# Patient Record
Sex: Female | Born: 1940 | Race: Black or African American | Hispanic: No | Smoking: Former smoker
Health system: Southern US, Community
[De-identification: ages and names within clinical notes are randomized; demographics above are authoritative.]

## PROBLEM LIST (undated history)

## (undated) DIAGNOSIS — I714 Abdominal aortic aneurysm, without rupture, unspecified: Secondary | ICD-10-CM

## (undated) DIAGNOSIS — J189 Pneumonia, unspecified organism: Secondary | ICD-10-CM

## (undated) DIAGNOSIS — R32 Unspecified urinary incontinence: Secondary | ICD-10-CM

## (undated) DIAGNOSIS — I739 Peripheral vascular disease, unspecified: Secondary | ICD-10-CM

## (undated) DIAGNOSIS — G459 Transient cerebral ischemic attack, unspecified: Secondary | ICD-10-CM

## (undated) DIAGNOSIS — I219 Acute myocardial infarction, unspecified: Secondary | ICD-10-CM

## (undated) DIAGNOSIS — N200 Calculus of kidney: Secondary | ICD-10-CM

## (undated) DIAGNOSIS — E785 Hyperlipidemia, unspecified: Secondary | ICD-10-CM

## (undated) DIAGNOSIS — I639 Cerebral infarction, unspecified: Secondary | ICD-10-CM

## (undated) DIAGNOSIS — H409 Unspecified glaucoma: Secondary | ICD-10-CM

## (undated) DIAGNOSIS — I729 Aneurysm of unspecified site: Secondary | ICD-10-CM

## (undated) DIAGNOSIS — K219 Gastro-esophageal reflux disease without esophagitis: Secondary | ICD-10-CM

## (undated) DIAGNOSIS — I10 Essential (primary) hypertension: Secondary | ICD-10-CM

## (undated) DIAGNOSIS — K5900 Constipation, unspecified: Secondary | ICD-10-CM

## (undated) DIAGNOSIS — M1990 Unspecified osteoarthritis, unspecified site: Secondary | ICD-10-CM

## (undated) HISTORY — DX: Transient cerebral ischemic attack, unspecified: G45.9

## (undated) HISTORY — DX: Essential (primary) hypertension: I10

## (undated) HISTORY — PX: EYE SURGERY: SHX253

## (undated) HISTORY — PX: TOTAL KNEE ARTHROPLASTY: SHX125

## (undated) HISTORY — DX: Hyperlipidemia, unspecified: E78.5

## (undated) HISTORY — PX: TUBAL LIGATION: SHX77

## (undated) HISTORY — DX: Acute myocardial infarction, unspecified: I21.9

## (undated) HISTORY — DX: Peripheral vascular disease, unspecified: I73.9

## (undated) HISTORY — PX: BACK SURGERY: SHX140

## (undated) HISTORY — PX: HERNIA REPAIR: SHX51

## (undated) HISTORY — PX: ABDOMINAL AORTIC ANEURYSM REPAIR: SHX42

## (undated) HISTORY — DX: Aneurysm of unspecified site: I72.9

## (undated) HISTORY — PX: VENTRAL HERNIA REPAIR: SHX424

## (undated) HISTORY — PX: ABDOMINAL AORTIC ANEURYSM REPAIR: SUR1152

## (undated) HISTORY — PX: ABDOMINAL HYSTERECTOMY: SHX81

## (undated) HISTORY — PX: JOINT REPLACEMENT: SHX530

## (undated) HISTORY — PX: OTHER SURGICAL HISTORY: SHX169

## (undated) HISTORY — DX: Unspecified osteoarthritis, unspecified site: M19.90

## (undated) HISTORY — PX: CARDIAC CATHETERIZATION: SHX172

---

## 1997-07-09 ENCOUNTER — Ambulatory Visit (HOSPITAL_COMMUNITY): Admission: RE | Admit: 1997-07-09 | Discharge: 1997-07-09 | Payer: Self-pay | Admitting: *Deleted

## 1998-08-29 ENCOUNTER — Encounter: Payer: Self-pay | Admitting: Emergency Medicine

## 1998-08-29 ENCOUNTER — Emergency Department (HOSPITAL_COMMUNITY): Admission: EM | Admit: 1998-08-29 | Discharge: 1998-08-29 | Payer: Self-pay | Admitting: Emergency Medicine

## 1998-09-24 ENCOUNTER — Emergency Department (HOSPITAL_COMMUNITY): Admission: EM | Admit: 1998-09-24 | Discharge: 1998-09-24 | Payer: Self-pay | Admitting: Emergency Medicine

## 1999-03-26 ENCOUNTER — Emergency Department (HOSPITAL_COMMUNITY): Admission: EM | Admit: 1999-03-26 | Discharge: 1999-03-26 | Payer: Self-pay | Admitting: Emergency Medicine

## 1999-06-07 ENCOUNTER — Emergency Department (HOSPITAL_COMMUNITY): Admission: EM | Admit: 1999-06-07 | Discharge: 1999-06-08 | Payer: Self-pay

## 1999-12-17 ENCOUNTER — Emergency Department (HOSPITAL_COMMUNITY): Admission: EM | Admit: 1999-12-17 | Discharge: 1999-12-17 | Payer: Self-pay | Admitting: *Deleted

## 1999-12-17 ENCOUNTER — Encounter: Payer: Self-pay | Admitting: Emergency Medicine

## 1999-12-31 ENCOUNTER — Encounter: Payer: Self-pay | Admitting: Emergency Medicine

## 1999-12-31 ENCOUNTER — Emergency Department (HOSPITAL_COMMUNITY): Admission: EM | Admit: 1999-12-31 | Discharge: 1999-12-31 | Payer: Self-pay | Admitting: Emergency Medicine

## 2000-03-02 ENCOUNTER — Ambulatory Visit (HOSPITAL_COMMUNITY): Admission: RE | Admit: 2000-03-02 | Discharge: 2000-03-02 | Payer: Self-pay | Admitting: Cardiology

## 2000-03-02 ENCOUNTER — Encounter: Payer: Self-pay | Admitting: Cardiology

## 2000-04-24 ENCOUNTER — Other Ambulatory Visit: Admission: RE | Admit: 2000-04-24 | Discharge: 2000-04-24 | Payer: Self-pay | Admitting: *Deleted

## 2000-04-26 ENCOUNTER — Encounter: Admission: RE | Admit: 2000-04-26 | Discharge: 2000-04-26 | Payer: Self-pay | Admitting: Cardiology

## 2000-04-26 ENCOUNTER — Encounter: Payer: Self-pay | Admitting: Cardiology

## 2000-06-30 ENCOUNTER — Encounter: Payer: Self-pay | Admitting: Cardiology

## 2000-06-30 ENCOUNTER — Encounter: Payer: Self-pay | Admitting: Emergency Medicine

## 2000-07-01 ENCOUNTER — Inpatient Hospital Stay (HOSPITAL_COMMUNITY): Admission: EM | Admit: 2000-07-01 | Discharge: 2000-07-02 | Payer: Self-pay | Admitting: Emergency Medicine

## 2000-07-01 ENCOUNTER — Encounter: Payer: Self-pay | Admitting: Cardiology

## 2000-07-01 ENCOUNTER — Encounter: Payer: Self-pay | Admitting: Neurology

## 2000-07-03 ENCOUNTER — Encounter: Admission: RE | Admit: 2000-07-03 | Discharge: 2000-10-01 | Payer: Self-pay | Admitting: Cardiology

## 2000-07-25 ENCOUNTER — Ambulatory Visit (HOSPITAL_COMMUNITY): Admission: RE | Admit: 2000-07-25 | Discharge: 2000-07-25 | Payer: Self-pay | Admitting: Cardiology

## 2000-07-25 ENCOUNTER — Encounter: Payer: Self-pay | Admitting: Cardiology

## 2000-08-05 ENCOUNTER — Encounter: Payer: Self-pay | Admitting: Emergency Medicine

## 2000-08-05 ENCOUNTER — Observation Stay (HOSPITAL_COMMUNITY): Admission: EM | Admit: 2000-08-05 | Discharge: 2000-08-06 | Payer: Self-pay | Admitting: *Deleted

## 2001-01-29 ENCOUNTER — Ambulatory Visit (HOSPITAL_COMMUNITY): Admission: RE | Admit: 2001-01-29 | Discharge: 2001-01-29 | Payer: Self-pay | Admitting: Gastroenterology

## 2001-02-22 ENCOUNTER — Encounter: Payer: Self-pay | Admitting: Emergency Medicine

## 2001-02-22 ENCOUNTER — Emergency Department (HOSPITAL_COMMUNITY): Admission: EM | Admit: 2001-02-22 | Discharge: 2001-02-22 | Payer: Self-pay | Admitting: Emergency Medicine

## 2001-05-26 ENCOUNTER — Inpatient Hospital Stay (HOSPITAL_COMMUNITY): Admission: EM | Admit: 2001-05-26 | Discharge: 2001-05-27 | Payer: Self-pay | Admitting: Emergency Medicine

## 2001-05-26 ENCOUNTER — Encounter: Payer: Self-pay | Admitting: Cardiology

## 2001-05-27 ENCOUNTER — Encounter: Payer: Self-pay | Admitting: Cardiovascular Disease

## 2001-08-27 ENCOUNTER — Encounter: Payer: Self-pay | Admitting: Cardiology

## 2001-08-27 ENCOUNTER — Encounter: Admission: RE | Admit: 2001-08-27 | Discharge: 2001-08-27 | Payer: Self-pay | Admitting: Cardiology

## 2003-02-20 ENCOUNTER — Encounter: Admission: RE | Admit: 2003-02-20 | Discharge: 2003-02-20 | Payer: Self-pay | Admitting: Cardiology

## 2003-02-20 ENCOUNTER — Encounter: Payer: Self-pay | Admitting: Cardiology

## 2003-03-22 ENCOUNTER — Emergency Department (HOSPITAL_COMMUNITY): Admission: EM | Admit: 2003-03-22 | Discharge: 2003-03-22 | Payer: Self-pay | Admitting: Emergency Medicine

## 2003-03-22 ENCOUNTER — Encounter: Payer: Self-pay | Admitting: *Deleted

## 2003-05-19 ENCOUNTER — Encounter: Admission: RE | Admit: 2003-05-19 | Discharge: 2003-05-19 | Payer: Self-pay | Admitting: Cardiology

## 2003-06-17 ENCOUNTER — Encounter: Admission: RE | Admit: 2003-06-17 | Discharge: 2003-06-17 | Payer: Self-pay | Admitting: Orthopedic Surgery

## 2003-06-18 ENCOUNTER — Ambulatory Visit (HOSPITAL_COMMUNITY): Admission: RE | Admit: 2003-06-18 | Discharge: 2003-06-18 | Payer: Self-pay | Admitting: Orthopedic Surgery

## 2003-12-31 ENCOUNTER — Encounter: Admission: RE | Admit: 2003-12-31 | Discharge: 2003-12-31 | Payer: Self-pay | Admitting: Gastroenterology

## 2004-01-04 ENCOUNTER — Encounter: Admission: RE | Admit: 2004-01-04 | Discharge: 2004-01-04 | Payer: Self-pay | Admitting: Cardiology

## 2004-02-05 ENCOUNTER — Ambulatory Visit (HOSPITAL_COMMUNITY): Admission: RE | Admit: 2004-02-05 | Discharge: 2004-02-05 | Payer: Self-pay | Admitting: Gastroenterology

## 2004-02-05 ENCOUNTER — Encounter (INDEPENDENT_AMBULATORY_CARE_PROVIDER_SITE_OTHER): Payer: Self-pay | Admitting: *Deleted

## 2004-09-02 ENCOUNTER — Encounter: Admission: RE | Admit: 2004-09-02 | Discharge: 2004-09-02 | Payer: Self-pay | Admitting: Cardiology

## 2004-10-04 ENCOUNTER — Encounter: Admission: RE | Admit: 2004-10-04 | Discharge: 2004-10-04 | Payer: Self-pay | Admitting: Cardiology

## 2005-07-21 ENCOUNTER — Encounter: Admission: RE | Admit: 2005-07-21 | Discharge: 2005-07-21 | Payer: Self-pay | Admitting: Cardiology

## 2005-07-25 ENCOUNTER — Emergency Department (HOSPITAL_COMMUNITY): Admission: EM | Admit: 2005-07-25 | Discharge: 2005-07-25 | Payer: Self-pay | Admitting: Emergency Medicine

## 2005-07-25 ENCOUNTER — Ambulatory Visit (HOSPITAL_COMMUNITY): Admission: RE | Admit: 2005-07-25 | Discharge: 2005-07-25 | Payer: Self-pay | Admitting: Cardiology

## 2005-08-11 ENCOUNTER — Ambulatory Visit (HOSPITAL_COMMUNITY): Admission: RE | Admit: 2005-08-11 | Discharge: 2005-08-11 | Payer: Self-pay | Admitting: *Deleted

## 2005-09-05 ENCOUNTER — Inpatient Hospital Stay (HOSPITAL_COMMUNITY): Admission: RE | Admit: 2005-09-05 | Discharge: 2005-09-09 | Payer: Self-pay | Admitting: *Deleted

## 2006-04-03 ENCOUNTER — Encounter: Admission: RE | Admit: 2006-04-03 | Discharge: 2006-04-03 | Payer: Self-pay | Admitting: Cardiology

## 2006-09-20 ENCOUNTER — Ambulatory Visit (HOSPITAL_COMMUNITY): Admission: RE | Admit: 2006-09-20 | Discharge: 2006-09-20 | Payer: Self-pay | Admitting: Obstetrics

## 2006-12-22 ENCOUNTER — Emergency Department (HOSPITAL_COMMUNITY): Admission: EM | Admit: 2006-12-22 | Discharge: 2006-12-22 | Payer: Self-pay | Admitting: Emergency Medicine

## 2007-01-05 ENCOUNTER — Encounter: Admission: RE | Admit: 2007-01-05 | Discharge: 2007-01-05 | Payer: Self-pay | Admitting: Cardiology

## 2008-06-05 ENCOUNTER — Encounter: Admission: RE | Admit: 2008-06-05 | Discharge: 2008-06-05 | Payer: Self-pay | Admitting: Surgery

## 2009-02-04 ENCOUNTER — Inpatient Hospital Stay (HOSPITAL_COMMUNITY): Admission: EM | Admit: 2009-02-04 | Discharge: 2009-02-05 | Payer: Self-pay | Admitting: Pediatrics

## 2009-02-04 ENCOUNTER — Emergency Department (HOSPITAL_COMMUNITY): Admission: EM | Admit: 2009-02-04 | Discharge: 2009-02-05 | Payer: Self-pay | Admitting: Family Medicine

## 2009-02-11 ENCOUNTER — Inpatient Hospital Stay (HOSPITAL_BASED_OUTPATIENT_CLINIC_OR_DEPARTMENT_OTHER): Admission: RE | Admit: 2009-02-11 | Discharge: 2009-02-11 | Payer: Self-pay | Admitting: Cardiology

## 2009-03-11 ENCOUNTER — Emergency Department (HOSPITAL_COMMUNITY): Admission: EM | Admit: 2009-03-11 | Discharge: 2009-03-12 | Payer: Self-pay | Admitting: Emergency Medicine

## 2009-12-07 ENCOUNTER — Ambulatory Visit (HOSPITAL_COMMUNITY): Admission: RE | Admit: 2009-12-07 | Discharge: 2009-12-07 | Payer: Self-pay | Admitting: Cardiology

## 2009-12-07 ENCOUNTER — Ambulatory Visit: Payer: Self-pay | Admitting: Vascular Surgery

## 2009-12-07 ENCOUNTER — Encounter (INDEPENDENT_AMBULATORY_CARE_PROVIDER_SITE_OTHER): Payer: Self-pay | Admitting: Cardiology

## 2009-12-29 ENCOUNTER — Ambulatory Visit: Payer: Self-pay | Admitting: Vascular Surgery

## 2010-01-18 ENCOUNTER — Emergency Department (HOSPITAL_COMMUNITY): Admission: EM | Admit: 2010-01-18 | Discharge: 2010-01-18 | Payer: Self-pay | Admitting: Family Medicine

## 2010-03-25 ENCOUNTER — Emergency Department (HOSPITAL_COMMUNITY): Admission: EM | Admit: 2010-03-25 | Discharge: 2010-03-25 | Payer: Self-pay | Admitting: Emergency Medicine

## 2010-04-07 ENCOUNTER — Encounter: Admission: RE | Admit: 2010-04-07 | Discharge: 2010-04-07 | Payer: Self-pay | Admitting: Family Medicine

## 2010-04-07 ENCOUNTER — Ambulatory Visit: Payer: Self-pay | Admitting: Vascular Surgery

## 2010-06-18 ENCOUNTER — Encounter: Payer: Self-pay | Admitting: Orthopedic Surgery

## 2010-07-12 ENCOUNTER — Encounter: Payer: Self-pay | Admitting: Internal Medicine

## 2010-07-13 ENCOUNTER — Encounter: Payer: Self-pay | Admitting: Internal Medicine

## 2010-07-15 ENCOUNTER — Inpatient Hospital Stay (INDEPENDENT_AMBULATORY_CARE_PROVIDER_SITE_OTHER)
Admission: RE | Admit: 2010-07-15 | Discharge: 2010-07-15 | Disposition: A | Discharge status: Home / Self Care | Payer: Medicare Other | Source: Ambulatory Visit | Attending: Emergency Medicine | Admitting: Emergency Medicine

## 2010-07-15 DIAGNOSIS — F17200 Nicotine dependence, unspecified, uncomplicated: Secondary | ICD-10-CM

## 2010-07-15 DIAGNOSIS — L97909 Non-pressure chronic ulcer of unspecified part of unspecified lower leg with unspecified severity: Secondary | ICD-10-CM

## 2010-08-10 LAB — DIFFERENTIAL
Basophils Absolute: 0 10*3/uL (ref 0.0–0.1)
Basophils Relative: 1 % (ref 0–1)
Eosinophils Absolute: 0.2 10*3/uL (ref 0.0–0.7)
Eosinophils Relative: 3 % (ref 0–5)
Lymphocytes Relative: 42 % (ref 12–46)
Lymphs Abs: 3.5 10*3/uL (ref 0.7–4.0)
Monocytes Absolute: 0.6 10*3/uL (ref 0.1–1.0)
Monocytes Relative: 7 % (ref 3–12)
Neutro Abs: 4.1 10*3/uL (ref 1.7–7.7)
Neutrophils Relative %: 48 % (ref 43–77)

## 2010-08-10 LAB — BASIC METABOLIC PANEL
Chloride: 106 mEq/L (ref 96–112)
GFR calc Af Amer: >60 mL/min (ref >60)
Potassium: 4.5 mEq/L (ref 3.5–5.1)

## 2010-08-10 LAB — BRAIN NATRIURETIC PEPTIDE: Pro B Natriuretic peptide (BNP): <30 pg/mL (ref 0.0–100.0)

## 2010-08-10 LAB — CBC
Hemoglobin: 12.2 g/dL (ref 12.0–15.0)
MCH: 29.3 pg (ref 26.0–34.0)
MCV: 88.7 fL (ref 78.0–100.0)
Platelets: 218 10*3/uL (ref 150–400)
WBC: 8.4 10*3/uL (ref 4.0–10.5)

## 2010-08-10 LAB — POCT CARDIAC MARKERS
CKMB, poc: <1 ng/mL — ABNORMAL LOW (ref 1.0–8.0)
CKMB, poc: <1 ng/mL — ABNORMAL LOW (ref 1.0–8.0)
Myoglobin, poc: 101 ng/mL (ref 12–200)
Myoglobin, poc: 125 ng/mL (ref 12–200)
Troponin i, poc: <0.05 ng/mL (ref 0.00–0.09)

## 2010-08-11 LAB — CULTURE, ROUTINE-ABSCESS

## 2010-08-22 ENCOUNTER — Encounter: Payer: Medicare Other | Admitting: Internal Medicine

## 2010-08-24 ENCOUNTER — Encounter: Payer: Medicare Other | Admitting: Internal Medicine

## 2010-09-01 LAB — URINALYSIS, ROUTINE W REFLEX MICROSCOPIC
Bilirubin Urine: NEGATIVE
Glucose, UA: 250 mg/dL — AB
Hgb urine dipstick: NEGATIVE
Ketones, ur: NEGATIVE mg/dL
Ketones, ur: NEGATIVE mg/dL
Nitrite: NEGATIVE
Protein, ur: NEGATIVE mg/dL
pH: 5.5 (ref 5.0–8.0)

## 2010-09-01 LAB — BASIC METABOLIC PANEL
CO2: 27 mEq/L (ref 19–32)
Chloride: 106 mEq/L (ref 96–112)
GFR calc Af Amer: >60 mL/min (ref >60)
Potassium: 4 mEq/L (ref 3.5–5.1)

## 2010-09-01 LAB — DIFFERENTIAL
Eosinophils Absolute: 0.2 10*3/uL (ref 0.0–0.7)
Eosinophils Relative: 3 % (ref 0–5)
Lymphs Abs: 3.4 10*3/uL (ref 0.7–4.0)
Monocytes Absolute: 0.4 10*3/uL (ref 0.1–1.0)
Monocytes Relative: 4 % (ref 3–12)

## 2010-09-01 LAB — URINE MICROSCOPIC-ADD ON

## 2010-09-01 LAB — CBC
HCT: 41 % (ref 36.0–46.0)
Hemoglobin: 13.8 g/dL (ref 12.0–15.0)
MCV: 93.6 fL (ref 78.0–100.0)
RBC: 4.38 MIL/uL (ref 3.87–5.11)
WBC: 8.2 10*3/uL (ref 4.0–10.5)

## 2010-09-02 LAB — CARDIAC PANEL(CRET KIN+CKTOT+MB+TROPI)
CK, MB: 0.9 ng/mL (ref 0.3–4.0)
CK, MB: 1.2 ng/mL (ref 0.3–4.0)
Relative Index: INVALID (ref 0.0–2.5)
Relative Index: INVALID (ref 0.0–2.5)
Total CK: 66 U/L (ref 7–177)
Troponin I: 0.01 ng/mL (ref 0.00–0.06)
Troponin I: 0.02 ng/mL (ref 0.00–0.06)

## 2010-09-02 LAB — CBC
HCT: 33.5 % — ABNORMAL LOW (ref 36.0–46.0)
MCHC: 33.6 g/dL (ref 30.0–36.0)
MCV: 93.1 fL (ref 78.0–100.0)
Platelets: 163 10*3/uL (ref 150–400)
Platelets: 195 10*3/uL (ref 150–400)
WBC: 6.7 10*3/uL (ref 4.0–10.5)
WBC: 7 10*3/uL (ref 4.0–10.5)

## 2010-09-02 LAB — COMPREHENSIVE METABOLIC PANEL
AST: 14 U/L (ref 0–37)
Albumin: 3.4 g/dL — ABNORMAL LOW (ref 3.5–5.2)
BUN: 10 mg/dL (ref 6–23)
CO2: 28 mEq/L (ref 19–32)
Calcium: 9 mg/dL (ref 8.4–10.5)
Creatinine, Ser: 0.96 mg/dL (ref 0.4–1.2)
GFR calc Af Amer: >60 mL/min (ref >60)
GFR calc non Af Amer: 58 mL/min — ABNORMAL LOW (ref >60)

## 2010-09-02 LAB — LIPID PANEL
Cholesterol: 169 mg/dL (ref 0–200)
HDL: 30 mg/dL — ABNORMAL LOW (ref >39)
LDL Cholesterol: 116 mg/dL — ABNORMAL HIGH (ref 0–99)
Triglycerides: 116 mg/dL (ref <150)

## 2010-09-02 LAB — POCT CARDIAC MARKERS: Troponin i, poc: <0.05 ng/mL (ref 0.00–0.09)

## 2010-09-02 LAB — PROTIME-INR
INR: 0.9 (ref 0.00–1.49)
Prothrombin Time: 12.5 seconds (ref 11.6–15.2)

## 2010-09-02 LAB — HEMOGLOBIN A1C: Mean Plasma Glucose: 240 mg/dL

## 2010-09-02 LAB — CK TOTAL AND CKMB (NOT AT ARMC)
CK, MB: 1.1 ng/mL (ref 0.3–4.0)
Relative Index: INVALID (ref 0.0–2.5)

## 2010-09-02 LAB — HEPARIN LEVEL (UNFRACTIONATED): Heparin Unfractionated: 0.75 [IU]/mL — ABNORMAL HIGH (ref 0.30–0.70)

## 2010-09-02 LAB — DIFFERENTIAL
Basophils Absolute: 0 10*3/uL (ref 0.0–0.1)
Basophils Relative: 0 % (ref 0–1)
Eosinophils Absolute: 0.1 10*3/uL (ref 0.0–0.7)
Lymphs Abs: 2.8 10*3/uL (ref 0.7–4.0)
Neutrophils Relative %: 51 % (ref 43–77)

## 2010-09-02 LAB — POCT I-STAT, CHEM 8
Calcium, Ion: 1.16 mmol/L (ref 1.12–1.32)
HCT: 39 % (ref 36.0–46.0)
TCO2: 27 mmol/L (ref 0–100)

## 2010-09-02 LAB — GLUCOSE, CAPILLARY
Glucose-Capillary: 204 mg/dL — ABNORMAL HIGH (ref 70–99)
Glucose-Capillary: 267 mg/dL — ABNORMAL HIGH (ref 70–99)

## 2010-09-02 LAB — BRAIN NATRIURETIC PEPTIDE: Pro B Natriuretic peptide (BNP): 58 pg/mL (ref 0.0–100.0)

## 2010-09-02 LAB — MAGNESIUM: Magnesium: 2 mg/dL (ref 1.5–2.5)

## 2010-09-12 ENCOUNTER — Encounter: Payer: Medicare Other | Admitting: Internal Medicine

## 2010-09-28 ENCOUNTER — Ambulatory Visit: Payer: Medicare Other

## 2010-10-03 ENCOUNTER — Encounter: Payer: Medicare Other | Admitting: Internal Medicine

## 2010-10-06 ENCOUNTER — Encounter (INDEPENDENT_AMBULATORY_CARE_PROVIDER_SITE_OTHER): Payer: Medicare Other

## 2010-10-06 ENCOUNTER — Ambulatory Visit (INDEPENDENT_AMBULATORY_CARE_PROVIDER_SITE_OTHER): Payer: Medicare Other | Admitting: Vascular Surgery

## 2010-10-06 DIAGNOSIS — I739 Peripheral vascular disease, unspecified: Secondary | ICD-10-CM

## 2010-10-06 DIAGNOSIS — I70219 Atherosclerosis of native arteries of extremities with intermittent claudication, unspecified extremity: Secondary | ICD-10-CM

## 2010-10-07 NOTE — Assessment & Plan Note (Signed)
OFFICE VISIT  [name], [name] DOB:  01/23/1941                                       10/06/2010 [id number]  The patient returns for followup today.  She was last seen in November of 2011.  We have been following her for mild claudication and peripheral arterial disease.  She continues to deny any rest pain.  She states that her walking distance has actually improved somewhat with increased exercise.  She is able to walk all over the grocery store with a small amount of support but still does get some claudication type symptoms after walking about a block.  However, she states that this is 2-3 times further than when we saw her in November.  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  Other chronic medical problems continue to remain diabetes, elevated cholesterol and hypertension.  These are all currently controlled and followed by Dr. Montez Morita.  She does unfortunately continues to smoke a half pack of cigarettes today but she again is considering trying to quit.  REVIEW OF SYSTEMS:  She is denying any shortness of breath.  She is denying any chest pain.  PHYSICAL EXAM:  Vital signs:  Blood pressure 167/80 in the left arm, heart rate 60 and regular, oxygen saturation is 97% on room air, respirations 14.  Chest:  Clear to auscultation.  Cardiac:  Regular rate and rhythm.  Lower extremities:  She has 2+ femoral pulses bilaterally. She has absent popliteal and pedal pulses.  Feet are pink, warm and well- perfused.  No ulcerations.  She had bilateral ABIs performed today which I reviewed and interpreted. ABI on the right side was 0.56 which is unchanged.  ABI on the left side was 0.7 which is slightly improved from November of 2011.  Overall, the patient is doing well.  I believe the best option at this point would be for her to follow up again  in 6 months' time with repeat ABIs or sooner if her symptoms become worse over time.  She again currently is in favor of conservative management with a walking program and trying to stop smoking.    Jessy Oto. Yosmar Ryker, MD Electronically Signed  CEF/MEDQ  D:  10/06/2010  T:  10/07/2010  Job:  [id number]  cc:   Ardyth Gal. Spruill, M.D.

## 2010-10-11 ENCOUNTER — Ambulatory Visit: Payer: Medicare Other | Attending: Family Medicine

## 2010-10-11 DIAGNOSIS — M545 Low back pain, unspecified: Secondary | ICD-10-CM | POA: Insufficient documentation

## 2010-10-11 DIAGNOSIS — IMO0001 Reserved for inherently not codable concepts without codable children: Secondary | ICD-10-CM | POA: Insufficient documentation

## 2010-10-11 DIAGNOSIS — R5381 Other malaise: Secondary | ICD-10-CM | POA: Insufficient documentation

## 2010-10-11 DIAGNOSIS — M25569 Pain in unspecified knee: Secondary | ICD-10-CM | POA: Insufficient documentation

## 2010-10-11 NOTE — Assessment & Plan Note (Signed)
OFFICE VISIT   Laura, RUSCH [name]  DOB:  12/28/1940                                       04/07/2010  [id number]   [name] returns for follow-up today.  She was last seen in August  2011.  She is being followed for peripheral arterial disease in both  lower extremities.  She continues to complain of lower extremity  claudication type symptoms after walking 20-30 yards.  She states that  these have not really changed since her last office visit.  She  occasionally does have some pain in her feet at night time but this does  not really sound like classic rest pain.   Chronic medical problems include diabetes, elevated cholesterol, and  hypertension.  These are all followed by Dr. Montez Morita.  She unfortunately  continues to smoke one half-pack of cigarettes per day.  She was  counseled regarding smoking cessation for greater than 3 minutes today.  Previous ABIs were in the 0.5 range at her previous visit.   SOCIAL HISTORY:  As mentioned above.   REVIEW OF SYSTEMS:  Unchanged from her previous office visit 3 months  ago.   PHYSICAL EXAMINATION:  Blood pressure is 138/81 in the left arm, heart  rate is 77 regular, oxygen saturation is 100% on room air.  Lower  extremity exam:  She has 2+ femoral pulses bilaterally.  She has absent  popliteal and pedal pulses.  Feet are cool bilaterally but reasonably  well-perfused.  She has no ulcerations on feet.   She had bilateral ABIs performed today which were 0.58 on the right and  0.58 on the left.  She had a toe pressure of 54 on the left and of 26 on  the right.  I have discussed with [name] again today whether or not  she was interested in an intervention for lower extremities.  She wishes  conservative management for now.  She will continue walking, taking her  antiplatelet therapy and try to quit smoking.  She will follow up in 3  months' time for repeat ABIs.     Jessy Oto. Fields, MD  Electronically Signed   CEF/MEDQ  D:  04/07/2010  T:  04/08/2010  Job:  [id number]   cc:   Ardyth Gal. Spruill, [name].D.

## 2010-10-11 NOTE — Assessment & Plan Note (Signed)
OFFICE VISIT   SCHENDEL, Felix M  DOB:  [birth year]                                       12/29/2009  [id number]   CHIEF COMPLAINT:  Bilateral leg pain.   HISTORY OF PRESENT ILLNESS:  The patient is a 70 year old female who  complains of bilateral lower extremity claudication.  She is referred by  Dr. Ardyth Gal. Spruill. She states that both of her legs have been  hurting from the calf down for about a year and this has become  progressively worse over time.  She states the pain starts in the calf  and radiates down into the foot.  The pain begins after walking  approximately one block.   ATHEROSCLEROTIC RISK FACTORS:  Her chronic medical problems include  diabetes, elevated cholesterol, hypertension.  These are all currently  controlled and followed by Dr. Montez Morita.  She has had diabetes for  greater than 10 years.  She also smokes one half-pack of cigarettes per  day.  Greater than 3 minutes today were spent regarding smoking  cessation counseling.  She states that she has tried to quit several  times.  She has been down from one pack a day for half pack a day over  the last 6 months.  She has tried nicotine patches in the past but these  made her heart flutter.  I did discuss with her several other options  and techniques today.   She denies any rest pain in her feet.  She denies any ulcerations on her  feet.  She denies any trouble healing wounds on her feet.   PAST MEDICAL HISTORY:  As listed above.   PAST SURGICAL HISTORY:  Left knee replacement, uterine tumor removed,  tubal ligation, ventral hernia repair, abdominal aortic aneurysm  repaired by Dr. Drucie Opitz in April 2007.  This was a tube graft.  This  was done with a 14 mm graft.   SOCIAL HISTORY:  She is retired.  She is single.  Smoking history as  listed above.  She does not consume alcohol regularly.  She denies any  drug use.  She has 5 children and states that several of them  have  problems that cause her significant stress.   FAMILY HISTORY:  Unremarkable.   REVIEW OF SYSTEMS:  On 12 different points was performed.  Please see  intake referral form for details regarding this.   MEDICATIONS:  Glimepiride 2 mg twice a day, Nexium 40 mg once a day,  lisinopril 20 mg once a day, metformin 1000 mg twice a day, Toprol 50 mg  once a day, ibuprofen 800 mg p.r.n., Crestor 20 mg q.h.s., B12 at 50 mg  once a day, vitamin D3, aspirin 81 mg once a day.   ALLERGIES:  She has an allergy listed to codeine.   PHYSICAL EXAMINATION:  Blood pressure 156/77 in the left arm, heart rate  78 and regular, respirations 24.  HEENT:  Unremarkable.  Neck:  Has 2+  carotid pulses without bruit.  Chest:  Clear to auscultation.  Cardiac:  Regular rate rhythm without murmur.  Abdomen:  Soft, nontender,  nondistended.  A well-healed midline laparotomy scar with a small area  of herniation below the level of the umbilicus.  There seems to be  possibly some incarcerated omentum in this but she is not tender and is  asymptomatic from this.  Musculoskeletal:  Exam shows no major  significant joint deformities.  She does have a well-healed left knee  scar from knee replacement.  Neurologic:  Exam shows mild left leg and  left hand weakness.  She apparently has had a prior stroke which  involved the left side but has significantly recovered from this.  This  was several years ago.  Skin:  No open ulcers or rashes.  Extremity/  Vascular:  Exam shows 2+ radial, 2+ femoral pulses bilaterally.  She has  absent popliteal and pedal pulses.  Feet are pink, warm, and well-  perfused.  She has no open ulcers on the feet.   She had a DVT ultrasound performed at [facility] on July 12  which showed no evidence of DVT.  She also had bilateral ABIs performed  at that time which were 0.47 on the right and 0.47 on the left.  Duplex  ultrasound showed primarily superficial femoral artery occlusive   disease.   I discussed with the patient today the risk of combined diabetes and  smoking and emphasized to her that she is at risk of  limb loss long-  term if she continues to smoke.  Her ABIs currently are in the range  consistent with her symptoms of claudication.  I gave her the options  today to consider arteriogram, possible angioplasty and stenting versus  medical management initially with Pletal and  exercise program and  smoking cessation.  She has opted for conservative management initially.  She will follow up with  [place] in 3 months' time for repeat ABIs.  She was  given a prescription today for Pletal 100 mg twice a day with a refill  to last her for 6 months.  She will also try to quit smoking.  She will  follow up sooner if she is not satisfied with the progression of her  symptoms.     Jessy Oto. Fields, MD  Electronically Signed   CEF/MEDQ  D:  12/29/2009  T:  12/29/2009  Job:  [id number]   cc:   Ardyth Gal. Spruill, M.D.

## 2010-10-14 NOTE — Discharge Summary (Signed)
[facility]  Patient:    [name], [name]                       MRN: [id number] Adm. Date:  06/30/00 Disc. Date: 07/02/00 Attending:  Ardyth Gal. Spruill, M.D.                           Discharge Summary  ADMITTING DIAGNOSES:  1. Blurred vision.  2. Dizziness.  3. Hiatal hernia.  4. Irregular heartbeat.  DISCHARGE DIAGNOSES:  1. New onset diabetes.  2. Visual disturbance.  3. Hypertension.  4. Tobacco use disorder.  5. Esophageal reflux.  6. Hyperlipidemia.  7. Transcerebral ischemia.  LABORATORY DATA:  Initial presentation revealed the white blood cell count on June 30, 2000 of 7.6, hemoglobin 13.6, hematocrit 40.7. The serum sodium was 135, potassium 4.0, chloride 102, CO2 content 28. Glucose on initial presentation was 505, BUN 12, creatinine 1.2. Hemoglobin was 11.2. The lipid profile revealed the cholesterol of 215, LDL cholesterol of 152.  Urinalysis revealed greater than 1000 mg per deciliter of glucose in the patients urine, was negative for ketones, negative for bilirubin, and the pH was 5.5.  The electrocardiogram on initial presentation revealed a normal sinus rhythm with essentially normal EKG. The patient also had the Doppler and 2-D echocardiogram performed and revealed the left ventricular size was normal. Overall her left ventricular function was normal. The ejection fraction was felt to be 50-55%. The right atrium was also normal and essentially unremarkable. The patient had additional radiologic studies including the MRA of the brain which was unremarkable as well as her chest x-ray which was also unremarkable.  HOSPITAL COURSE:  The patient was admitted and started on Lopressor, Norvasc and lovenox as well as enteric coated aspirin and Nexium. She was also started on the amaryl 2 mg p.o. daily and sliding scale insulin coverage. A 2-D echocardiogram was obtained to exclude embolic phenomenon as a possible reason for the  patients dizziness and confusion on initial presentation. The MRA was unrevealing when this was performed. In addition to this, the 2-D echocardiogram did not reveal any evidence of any intercavitary thrombi.  Consult was made by neurology who evaluated the patient and felt that her blurred vision was likely due to her new onset of diabetes and blood sugar in the 500 range. As the patient began treatment for her diabetes including sliding scale insulin, it was felt that she had reached maximum hospital benefit and it could be released with intensive home health to help to assist her with controlling her diabetes. The patient was given a glucose monitoring device while hospitalized and further reinforcement on her diet as well as diabetic teaching will be done as an outpatient. Near the time of discharge, the patients CBGs had decreased to less than 250.  CONDITION ON DISCHARGE:  Improved.  She will be seen back at my office between one and two weeks.  DISCHARGE MEDICATIONS:  She will be discharged on Lopressor 25 mg one daily, Norvasc 5 mg daily, ASA 325 mg p.o. daily, amaryl 2 mg p.o. b.i.d., lipitor 10 mg p.o. daily.  DISCHARGE DIET:  1800 calorie ADA diet with 2 gm sodium restriction.  Diabetic teaching classes will also be given and the patient will be seen in my office between one and two weeks.  DD:  07/18/00 TD:  07/20/00 Job: [id number]

## 2010-10-14 NOTE — Op Note (Signed)
[name], [name]              ACCOUNT NO.:  [id number]   MEDICAL RECORD NO.:  [id number]          PATIENT TYPE:  AMB   LOCATION:  SDS                          FACILITY:  [facility]   PHYSICIAN:  Dorothea Glassman, M.D.    DATE OF BIRTH:  1940/11/06   DATE OF PROCEDURE:  08/11/2005  DATE OF DISCHARGE:  08/11/2005                                 OPERATIVE REPORT   PHYSICIAN:  Gordy Clement, MD.   DIAGNOSIS:  Saccular abdominal aortic aneurysm.   PROCEDURE:  1.  Abdominal aortogram with pelvic runoff arteriography.  2.  Retrograde right femoral arteriogram.  3.  StarClose right common femoral artery.   ACCESS:  Right common femoral artery 5-French sheath.   CONTRAST:  120 mL of Visipaque.   COMPLICATIONS:  None apparent   CLINICAL NOTE:  [name] is a 70 year old African-American female  referred with an abnormal CT scan revealing a saccular configuration  abdominal aortic aneurysm. She is brought to the cath lab, at this time, for  diagnostic arteriography.   PROCEDURE NOTE:  The patient brought to the cath lab in stable condition;  placed in supine position; right groin prepped and draped in a sterile  fashion. Administered 1 mg of Versed and 50 mcg fentanyl intravenously. Skin  and subcutaneous tissue in the right groin instilled with 1% Xylocaine. The  site opened with an 11-blade. An 18-gauge needle introduced into the right  common femoral artery. A 0.035 J-wire passed through the needle into the mid  abdominal aorta under fluoroscopy. The needle removed, and a 5-French sheath  advanced over guidewire. The sheath flushed with heparin saline solution. A  graduated pigtail catheter advanced over the guidewire to the mid abdominal  aorta. Standard AP mid abdominal aortogram obtained. This revealed single  widely patent bilateral renal arteries. Then 3 cm beyond the origin of the  right renal artery there was a saccular configuration of abdominal aortic  aneurysm. This  was a blowout to the left. There was atherosclerotic  irregularity of the remainder of the infrarenal aorta. The common iliac  arteries bilaterally revealed atherosclerotic change without significant  stenosis or aneurysmal change. The hypogastric and external iliac arteries  were patent bilaterally.     A lateral aortogram obtained. This verified widely patent superior  mesenteric and celiac arteries. The pigtail catheter was brought down to the  aortic bifurcation and AP and bilateral oblique pelvic arteriograms  obtained. These verified patency of the hypogastric and external iliac  arteries bilaterally. Ectatic change noted in the right common iliac artery.  There is normal flow to the common femoral level bilaterally.   This completed the arteriogram procedure. The guidewire was reinserted and  the pigtail catheter removed. Retrograde injection then made through the  right femoral sheath. This verified the sheath to be in the right common  femoral artery. A guidewire was then inserted and the right femoral sheath  removed. The site reprepped and gloves were changed. A StarClose guidewire  advanced through the sheath. The sheath removed, and a StarClose sheath  advanced through over the guidewire. The guidewire removed.  StarClose device  advanced into the sheath. The device then deployed in the right common  femoral artery in routine fashion without apparent complication. Successful  deployment.   FINAL IMPRESSION:  1.  Single widely patent bilateral renal arteries.  2.  Saccular infrarenal abdominal aortic aneurysm.  3.  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  4.  Successful StarClose right common femoral artery.   DISPOSITION:  These results will be reviewed with the patient and family.  Evaluation made for possible endovascular closure of this saccular aneurysm.      Dorothea Glassman, M.D.  Electronically Signed     PGH/MEDQ  D:  08/11/2005  T:  08/12/2005  Job:   [id number]   cc:   Ardyth Gal. Spruill, M.D.  Fax: Portal Cath Lab

## 2010-10-14 NOTE — Consult Note (Signed)
[facility]  Patient:    [name], [name]                     MRN: [id number] Proc. Date: 06/30/00 Adm. Date:  [id number] Attending:  Patricia Nettle                          Consultation Report  DATE OF BIRTH:  August 31, 1940.  REFERRING PHYSICIAN:  Dr. Allegra Lai. Harwani.  REASON FOR EVALUATION:  Episodic visual changes.  HISTORY OF PRESENT ILLNESS:  Ms. 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  Patient reports that she has had three visual changes, all of which occurred while driving, of "dimming" of vision involving the entire field of vision associated with decreased acuity and questionable loss of peripheral vision.  The episodes last a few minutes, she will close her eyes and "relax" and the vision will clear back to baseline.  There is no clear association of the episodes with headache, weakness or numbness of the limbs, dysarthria, nausea, vomiting or dizziness.  She reports that she had a similar spell in 1987 when she was driving back from [place], which actually was more severe.  She stopped to visit relatives in [place] and while there, was advised to get checked out at [facility], where she was told she had a stroke.  She thinks she had some visual loss off to the left at that time.  She presently feels baseline and thinks that her vision is normal.  PAST MEDICAL HISTORY:  Remarkable for hypertension, questionable CVA, as above, also a questionable history of "heart problems."  FAMILY HISTORY:  Noncontributory.  SOCIAL HISTORY:  She smokes a pack a day and she does not drink alcohol.  ALLERGIES:  No known drug allergies.  MEDICATIONS:  Atenolol, Norvasc, Nexium, garlic, vitamins B and E.  She does not take antiplatelet therapy.  REVIEW OF SYSTEMS:   For the past two months, she has had episodic dizziness and near-syncopal spells.  She has also had polyuria and fatigue.  She has had some skin eruptions recently and is presently experiencing a vaginal yeast infection.  No clear recent history of chest pain, shortness of breath, palpitations.  PHYSICAL EXAMINATION  VITAL SIGNS:  Temperature 97.3, blood pressure 151/76, pulse 76, respirations 16.  GENERAL/MENTAL STATUS:  She is awake, alert and completely oriented.  Speech is fluent and not dysarthric.  Mood is euthymic and affect appropriate.  NECK:  Supple without bruit.  HEART:  Regular rate and rhythm without murmurs.  NEUROLOGIC:  Cranial nerves:  Funduscopic exam is benign.  Pupils are equal and briskly reactive.  Extraocular movements are intact without nystagmus. Visual fields are full to confrontation.  Face, tongue and palate all move normally.  Motor exam:  Normal bulk and tone.  Normal strength in all tested extremity muscles.  Sensory exam:  Intact to light touch and vibration in all extremities.  Cerebellar:  Rapid alternating movements were normal. Finger-to-nose was performed well.  Gait exam is deferred.  Reflexes are slightly increased throughout except at the ankles, where they are clearly diminished.  Toes are downgoing.  LABORATORY AND IMAGING DATA:  CBC is normal.  BMET is remarkable for a glucose of 505; of note, she has no prior  history of hyperglycemia.  Cardiac enzymes are negative. Coagulations are normal.  EKG is unremarkable.  CT of the head is personally reviewed and is, by my read, normal.  There is no clear evidence of previous strokes.  IMPRESSION 1. Episodic visual loss may be due to #2 but given history, I am concerned    about vertebrobasilar insufficiency. 2. New-onset diabetes mellitus.  PLAN AND RECOMMENDATIONS 1. Will start heparin per stroke protocol. 2. MRI/MRA of the brain, mostly to assess posterior circulation. 3.  Echocardiogram. 4. Disposition pending above. 5. She also clearly needs inpatient evaluation of her new-onset diabetes. DD:  06/30/00 TD:  07/02/00 Job: [id number]

## 2010-10-14 NOTE — Discharge Summary (Signed)
[name], [name]              ACCOUNT NO.:  [id number]   MEDICAL RECORD NO.:  [id number]          PATIENT TYPE:  INP   LOCATION:  2007                         FACILITY:  [facility]   PHYSICIAN:  Dorothea Glassman, M.D.    DATE OF BIRTH:  1940/10/26   DATE OF ADMISSION:  09/05/2005  DATE OF DISCHARGE:                                 DISCHARGE SUMMARY   ADMISSION DIAGNOSES:  1.  Abdominal aortic aneurysm.  2.  Epigastric hernia.   DISCHARGE DIAGNOSES:  1.  Abdominal aortic aneurysm status post abdominal aortic aneurysm repair.  2.  Epigastric hernia status post epigastric hernia repair.  3.  Hypertension.  4.  Type 2 diabetes mellitus.  5.  Hyperlipidemia.  6.  Lumbar disc disease.  7.  History of transient ischemic attack in 1987.  8.  Cerebrovascular accident in [place].  9.  Osteoarthritis.  10. Irregular heart beat per patient.  11. Hiatal hernia.   SERVICE:  CVTS   CONSULTS:  None.   PROCEDURES:  On September 05, 2005, the patient underwent repair of infrarenal  saccular abdominal aortic aneurysm with straight aortic Dacron graft.  The  patient also underwent an epigastric hernia repair by Dr. Drucie Opitz.   HISTORY AND PHYSICAL:  The patient is a 70 year old black female with known  history of abdominal aortic aneurysm which was found approximately 1 year  ago during workup for cholecystitis.  Has been followed by Dr. Montez Morita and  she was subsequently referred to Dr. Amedeo Plenty for evaluation.  The patient  recently underwent a CT scan which showed a 4.6 cm in maximum diameter  aortic aneurysm.  Following the patient's visit with Dr. Amedeo Plenty she underwent  arteriography which confirmed a saccular infrarenal abdominal aortic  aneurysm with single renal arteries bilaterally and ectatic changes of the  right common iliac artery.  The patient was not felt to be a good candidate  for stent graft repair of the aneurysm.  She has been completely  asymptomatic and specifically denies abdominal  pain or back pain.  Dr. Amedeo Plenty  recommended proceeding with open repair of her aneurysm at this time in  order to decrease her risk of rupture and has explained the risks, benefits,  and alternatives of the procedure to the patient.   HOSPITAL COURSE:  The patient's hospital stay has been uneventful.  The  patient has progressed appropriately.  On postoperative day #1 the patient  was alert and sociable.  Her vital signs remained stable.  Her chest was  clear.  Her abdomen was soft with active bowel sounds present and she had 2+  femoral pulses.  Her CBC and BMP were also within normal limits.  Postoperative day #2 the patient was transferred to 2000 routinely.  She did  not have a bowel movement; however, she had flatus.  The patient's abdomen  was slightly distended with hypoactive bowel sounds.  Her incision remained  clear, dry, and intact.  The patient's vital signs were stable and her labs  were within normal limits.  The patient was started on sips of water.  She  was  to continue her incentive spirometry and ambulate.  Her PCA was  discontinued on September 07, 2005.  On postoperative day #3 the patient did  have a bowel movement.  She does have bowel sounds x4 on exam.  Her diet was  advanced to liquids, which she tolerated well.   The patient will be tentatively discharged on September 10, 2005, providing she  can tolerate a regular diet.  She will continue her ambulation and incentive  spirometry.  During the hospital stay the patient's diabetes was controlled  well with sliding scale insulin.  The patient will be started on her p.o.  medications once tolerating a regular diet.   PHYSICAL EXAMINATION:  VITAL SIGNS:  Temperature 97.8, pulse 80, blood  pressure 147/87, O2 saturations 97% on room air.  CBGs 113, 107, 96, 105.  The patient did not have any a.m. labs.  RESPIRATIONS:  Decreased breath sounds bilaterally at the bases.  CARDIAC:  Regular rate and rhythm.  ABDOMEN:  Bowel  sounds x4.  Incision clear, dry, and intact.  EXTREMITIES:  Bilaterally warm to touch.   DISCHARGE CONDITION:  Good.   DISPOSITION:  The patient will be discharged to home.   MEDICATIONS:  1.  Caduet 5/10 mg p.o. daily.  2.  Toprol-XL 50 mg p.o. daily.  3.  Glimepiride 2 mg p.o. daily.  4.  Metformin 1000 mg p.o. daily.  5.  NitroQuick 0.4 mg p.o. p.r.n.  6.  Aleve p.r.n.  7.  Tylox one to two tablets every 4 hours p.r.n.   INSTRUCTIONS:  The patient is instructed to follow a low fat/low  salt/diabetic diet.  No driving or heavy lifting greater than 10 pounds.  The patient was educated to ambulate 2-4 times daily and increase activity  as tolerated.  She is to continue her breathing exercises.  The patient may  shower and clean her wounds with mild soap and water.  The patient is to  call the office if she experiences any problems such as a wound redness,  drainage, temperature greater than 101.5.   FOLLOWUP:  The patient has a followup appointment with Dr. Amedeo Plenty on  Thursday, Oct 05, 2005, at 12:40 p.m.  The patient will have her staples  removed on September 22, 2005, at 10 a.m.      Richardson Dopp, PA      P. Drucie Opitz, M.D.  Electronically Signed    JMW/MEDQ  D:  09/08/2005  T:  09/08/2005  Job:  [id number]   cc:   Ardyth Gal. Spruill, M.D.  Fax: [phone]

## 2010-10-14 NOTE — Discharge Summary (Signed)
[place]. [facility]  Patient:    [name], [name] Visit Number: [id number] MRN: [id number]          Service Type: MED Location: [id number] Attending Physician:  Birdie Riddle Dictated by:   Birdie Riddle, M.D. Admit Date:  05/26/2001 Discharge Date: 05/27/2001   CC:         Ardyth Gal. Spruill, M.D.   Discharge Summary  REFERRING PHYSICIAN:  Ardyth Gal. Spruill, M.D.  PRINCIPAL DIAGNOSES: 1. Angina. 2. Chest wall pain. 3. Reflux esophagitis. 4. Hypertension. 5. Borderline diabetes mellitus, type 2.  PRINCIPAL PROCEDURE:  Treadmill stress test with Cardiolite injection done on May 27, 2001.  DISCHARGE MEDICATIONS:  Toprol XL 50 mg half daily, Norvasc 5 mg one daily, Lipitor 10 mg one daily, aspirin 81 mg daily, and Protonix 40 mg one daily.  DISCHARGE ACTIVITY:  As tolerated.  DISCHARGE DIET:  Low fat and low salt diet.  FOLLOWUP:  By Dr. Wallene Huh in two to four weeks.  The patient to call [phone] for appointment.  HISTORY OF PRESENT ILLNESS:  This is a 70 year old black female with complaint of chest pressure, swelling-type, radiating to neck and arms in the middle of the night.  The patient took two aspirin.  Has increased stress at home.  Also had some shortness of breath and sweating spells, and the chest pain lasted for about 20-30 minutes.  Has significant risk factors of diabetes, hypertension, smoking one-pack of cigarettes per day for 25 years, and elevated cholesterol level.  PHYSICAL EXAMINATION:  Temperature 98, pulse 61, respirations 18, and blood pressure 112/68.  Height 5 feet and 7 inches.  Weight 195 pounds.  The patient was alert and oriented x3.  Head normocephalic and atraumatic.  Eyes brown wit pupils equal and reactive to light.  Extraocular movements intact.  Ears, nose, and throat reveals mucous membranes pink and moist.  Neck with no JVD. Lungs clear.  Heart - normal S1 and S2.  Abdomen soft with  right upper quadrant area tenderness.  Extremities with no edema, cyanosis, or clubbing, 2+ peripheral pulses.  CNS - cranial nerves II-XII grossly intact.  The patient had a weak left hand grip and is a right-handed person.  LABORATORY DATA:  EKG - normal sinus rhythm with nonspecific ST-T changes. Nuclear stress test - no reversible ischemia.  Ejection fraction about 40%. Cardiac enzymes - CK-MB normal x3.  Cholesterol HDL and LDL are normal and on the low side.  HOSPITAL COURSE:  The patient was admitted to the telemetry unit.  Myocardial infarction was ruled out with normal CK-MBs and troponin I x3.  She underwent stress Cardiolite study that did not show any reversible ischemia.  The patients overall condition remained stable.  Hence, she was discharged home in satisfactory condition with follow up by her primary care physician in two weeks. Dictated by:   Birdie Riddle, M.D. Attending Physician:  Birdie Riddle DD:  08/06/01 TD:  08/08/01 Job: [id number] [id number]

## 2010-10-14 NOTE — Op Note (Signed)
[name]              ACCOUNT NO.:  [id number]   MEDICAL RECORD NO.:  [id number]          PATIENT TYPE:  INP   LOCATION:  2310                         FACILITY:  [facility]   PHYSICIAN:  Dorothea Glassman, M.D.    DATE OF BIRTH:  June 25, 1940   DATE OF PROCEDURE:  09/05/2005  DATE OF DISCHARGE:                                 OPERATIVE REPORT   SURGEON:  Gordy Clement, M.D.   ASSISTANT:  Leta Baptist, PA.   ANESTHETIC:  General endotracheal.   ANESTHESIOLOGIST:  Soledad Gerlach, M.D.   PREOPERATIVE DIAGNOSIS:  A 4.6 cm infrarenal saccular abdominal aortic  aneurysm.   POSTOPERATIVE DIAGNOSES:  1.  A 4.6 cm infrarenal saccular abdominal aortic aneurysm.  2.  Epigastric hernia.   PROCEDURES:  1.  Repair of infrarenal saccular abdominal aortic aneurysm with straight      aortic Dacron graft.  2.  Epigastric hernia repair.   CLINICAL NOTE:  [name] is a 70 year old, African-American female  referred for evaluation of a abdominal aortic aneurysm.  Workup of this  revealed a saccular aneurysm measuring 4.6 cm in maximal diameter.  Due to  the saccular nature of the aneurysm, the patient was brought the operating  at this time for elective repair.  She has undergone preoperative evaluation  including Cardiolite evaluation without evidence of ischemia.  Risks of the  operative procedure were explained to the patient in detail, with the  mention of morbidity and mortality of 1-2%, to include but not limited to  MI, CVA, limb loss, renal failure, and death.   OPERATIVE PROCEDURE:  The patient was brought to the operating room stable  hemodynamic condition.  Placed under general endotracheal anesthesia.  Foley  catheter, arterial line, and Swan-Ganz catheter in place.  The abdomen and  both legs were prepped and draped in a sterile fashion in the supine  position.   Midline abdominal skin incision made from xiphoid to pubis.  Dissection  carried through the  subcutaneous tissue electrocautery.  Linea alba incised.  The patient had had a previous infrainguinal midline incision for  hysterectomy.  There were adhesions present in the pelvis.  These were  primarily due to omental adhesions.  The omentum was mobilized fully and  adhesions lysed.   Noted in the epigastrium was a fairly large hernia.  This contained  properitoneal fat.  The sac was freed from the subcutaneous tissue.  This  was fully mobilized and the sac excised.   A full laparotomy evaluation was carried out.  Stomach and duodenum were  unremarkable.  The nasogastric tube was placed in the stomach.  Liver and  gallbladder were normal.  Bile ducts and pancreas unremarkable.  Small bowel  normal.  Large bowel revealed no masses.  The patient is status post  hysterectomy.  Small atrophic ovaries and fallopian tubes were present  bilaterally.   The small bowel was retracted to the right, and the transverse colon brought  superiorly.  The retroperitoneum was incised proximally up to the juxta-  renal aorta.  The left renal vein was mobilized and retracted.  The inferior  mesenteric vein was preserved.  The origin of the renal arteries bilaterally  were identified.  The right renal artery was inferior to the left.  The  proximal infrarenal aorta was a good quality vessel.  There was a large  saccular aneurysm protruding to the left.  Distal dissection was then  carried down, the origin of the inferior mesenteric artery was freed and  encircled with a vessel loop.  Further distal was dissection carried down to  the bifurcation.  The proximal common iliac arteries bilaterally were  exposed and encircled with vessel loops.  They were moderately diseased with  plaque posteriorly.   The patient was then administered 7000 units of heparin intravenously, 25 g  of mannitol intravenously.  The infrarenal aorta was controlled with an  aortic DeBakey clamp.  The proximal common iliac arteries  were controlled  with coarctation clamps.  The infrarenal aorta was opened longitudinally  with electrocautery.  Backbleeding lumbar vessels were controlled with  figure-of-eight 2-0 silk suture.  There was a large saccular aneurysm  present.  This was protruding to the left.  This contained laminated  thrombus.   Cultures of the aneurysm sac were taken for aerobic and anaerobic bacteria.   The infrarenal aorta was divided transversely.  A 14 mm straight Dacron  Hemashield graft was anastomosed end-to-end to the infrarenal aorta using  running 4-0 Prolene suture.  At completion of this, the proximal anastomosis  was tested and was hemostatic.  A clamp was then placed across the graft.  The graft was then brought down to the aortic bifurcation.  The aorta was  divided proximal to its bifurcation.  The straight graft was anastomosed end-  to-end to the aortic bifurcation using a running 4-0 Prolene suture.  At  completion of the distal anastomosis, the graft was flushed.  The iliac  vessels were backflushed.  Clamps were then removed, reperfusing each leg  without difficulty.  Excellent flow was present bilaterally.   Adequate hemostasis was obtained.  The patient was administered 50 mg of  protamine intravenously.  Sponges and instrument counts were correct.   The midline fascia was then closed with a running #1 PDS suture.  The area  of epigastric hernia was closed with running #1 PDS suture.  The  subcutaneous tissue was closed with interrupted 3-0 Vicryl suture.  The skin  was closed with staples.  A sterile dressing was applied.   There were no apparent complications during the operative procedure.  The  patient was transferred to the recovery room in stable condition.      Dorothea Glassman, M.D.  Electronically Signed     PGH/MEDQ  D:  09/05/2005  T:  09/05/2005  Job:  [id number]

## 2010-10-14 NOTE — H&P (Signed)
[name], [name]              ACCOUNT NO.:  [id number]   MEDICAL RECORD NO.:  [id number]            PATIENT TYPE:   LOCATION:                                 FACILITY:   PHYSICIAN:  Dorothea Glassman, M.D.    DATE OF BIRTH:  [birth year]   DATE OF ADMISSION:  09/05/2005  DATE OF DISCHARGE:                                HISTORY & PHYSICAL   REASON FOR ADMISSION:  Abdominal aortic aneurysm.   HISTORY OF PRESENT ILLNESS:  The patient is a 70 year old black female with  a known history of abdominal aortic aneurysm which was found approximately 1  year ago during a workup for cholecystitis.  Has been followed by Dr.  Montez Morita and she was subsequently referred to Dr. Amedeo Plenty for evaluation.  She  recently underwent a CT scan which showed a 4.6 cm maximal diameter.  Following her visit with Dr. Amedeo Plenty she underwent arteriography which  confirmed a saccular infrarenal abdominal aortic aneurysm with single renal  arteries bilaterally and ectatic changes of the right common iliac artery.  She was not felt to be a good candidate for stent graft repair of the  aneurysm.  She has been completely asymptomatic and specifically denies  abdominal pain or back pain.  Dr. Amedeo Plenty recommended proceeding with open  repair of her aneurysm at this time in order to decrease her risk of rupture  and he has explained the risks, benefits and alternatives of the procedure  to the patient.   PAST MEDICAL HISTORY:  1.  Hypertension.  2.  Type 2 diabetes mellitus.  3.  Hyperlipidemia.  4.  Lumbar disk disease.  5.  History of TIA in 1987.  6.  CVA in 1991.  7.  Osteoarthritis.  8.  Hiatal hernia.  9.  Irregular heartbeat per the patient.   PAST SURGICAL HISTORY:  1.  Left knee replacement.  2.  Removal of benign uterine cysts.  3.  Bilateral tubal ligations.  4.  Partial hysterectomy.  5.  Back surgery.   CURRENT MEDICATIONS:  1.  Caduet 5/10 mg daily.  2.  Toprol XL 50 mg daily.  3.  Glimepiride 2 mg  daily.  4.  Metformin 1000 mg daily.  5.  NitroQuick 0.4 mg p.r.n.  6.  Aleve p.r.n. for pain.   ALLERGIES:  NO KNOWN DRUG ALLERGIES although CODEINE causes dizziness.   SOCIAL HISTORY:  She is divorced and has five children.  She is a retired  Theatre manager.  She continues to smoke one pack of cigarettes per day and  has smoked for 20 years.  She denies alcohol use.   FAMILY HISTORY:  Her mother is deceased and had diabetes and history of  stroke.  Her father is deceased and had coronary artery disease and a  stroke.  She has 13 siblings who are living including one brother who has  had a CVA.  One sister with CAD and diabetes, two sisters with diabetes and  one sister with CAD.   REVIEW OF SYSTEMS:  See history of present illness for pertinent positives  and negatives.  Also she has a history of chronic back pain secondary to  disk disease and radiation of her back pain into her right leg.  She has  some residual left-sided weakness and gait disturbances following her CVA in  1991.  She recently has experienced generalized weakness.  She has a history  of osteoarthritis and joint pain secondary to this.  She has occasional  heart palpitations, both tachy and bradycardia and followed by Dr. Montez Morita.  She has chronic constipation.  She also has occasional reflux symptoms.  She  denies fevers, chills, weight loss, recent infections, recent TIA symptoms,  visual changes, dysphagia, amaurosis fugax, chest pain, shortness of breath,  dyspnea on exertion, orthopnea, PND, abdominal pain, nausea, vomiting,  diarrhea, hematochezia, melena, hematuria, dysuria or nocturia, lower  extremity claudication symptoms, rest pain, nonhealing lower extremity  ulcers, intolerance to heat or cold, anxiety, depression or other  psychiatric symptoms.   PHYSICAL EXAMINATION:  VITAL SIGNS:  Blood pressure is 122/78, heart rate 60  and regular, respirations 18 unlabored.  GENERAL: This is an obese  African-American female in no acute distress.  HEENT:  Normocephalic, atraumatic.  Pupils equal, round and react to light  accommodation.  Extraocular movements intact.  TMs and canals are clear.  Nares patent bilaterally.  Oropharynx is clear with upper dentures in place  and negatives lower dentition which is in fair to good repair.  NECK:  Supple without lymphadenopathy, thyromegaly or carotid bruits.  LUNGS:  Clear to auscultation.  HEART: Regular rate and rhythm without murmurs, rubs or gallops.  ABDOMEN:  Soft, obese, nontender, nondistended with active bowel sounds in  all quadrants.  There is a palpable pulsatile midline mass with no  tenderness.  EXTREMITIES:  No clubbing, cyanosis or edema.  She has 2+ femoral and 1+  posterior tibial pulses bilaterally with no palpable dorsalis pedis pulses.  NEUROLOGIC:  Cranial nerves II-XII grossly intact.  She is alert and  oriented x3.  Gait is within normal limits.  She has symmetrical upper and  lower extremity strength.   ASSESSMENT/PLAN:  This is a 70 year old black female with a saccular  abdominal aortic aneurysm.  She will undergo repair on September 05, 2005, by  Dr. Drucie Opitz.      Suzzanne Cloud, P.A.      Dorothea Glassman, M.D.  Electronically Signed    GC/MEDQ  D:  08/31/2005  T:  08/31/2005  Job:  [id number]   cc:   Ardyth Gal. Spruill, M.D.  Fax: [phone]

## 2010-10-14 NOTE — Procedures (Signed)
[place]. [facility]  Patient:    [name], [name]. Visit Number: [id number] MRN: [id number]          Service Type: Attending:  Nelwyn Salisbury, M.D. Dictated by:   Nelwyn Salisbury, M.D. Proc. Date: 01/29/01   CC:         Ardyth Gal. Spruill, M.D.   Procedure Report  DATE OF BIRTH:  [birth year]  PROCEDURE PERFORMED:  Colonoscopy.  ENDOSCOPIST: Nelwyn Salisbury, M.D.  INSTRUMENT USED:  Olympus video colonoscope (pediatric).  INDICATION FOR PROCEDURE:  A 70 year old African-American female with a history of colonic polyps, rule out recurrent polyps.  PREPROCEDURE PREPARATION: Informed consent was procured from the patient.  The patient was fasted for eight hours prior to the procedure and prepped with a bottle of magnesium citrate and a gallon of NuLytely the night prior to the procedure.  PREPROCEDURE PHYSICAL:  VITAL SIGNS:  The patient had stable vital signs.  NECK:  Supple.  CHEST:  Clear to auscultation.  S1 and S2 regular.  ABDOMEN: Soft with normal bowel sounds.  DESCRIPTION OF PROCEDURE:  The patient was placed in the left lateral decubitus position and sedated with 50 mg of Demerol and 5 mg of Versed intravenously.  Once the patient was adequately sedated and maintained on low flow oxygen and continuous cardiac monitoring, the Olympus video colonoscope was advanced from the rectum to the cecum without difficulty. There was some stool in the right colon and cecum.  Multiple washes were done. No masses, polyps, erosions, ulcerations, or diverticular were seen.  The patient tolerated the procedure well without complications.  IMPRESSION:  Healthy appearing colon up to the cecum.  No masses or polyps seen.  RECOMMENDATIONS:  Repeat colorectal cancer screening is recommended in the next five years unless the patient was to develop any abnormal symptoms in the interim.  Outpatient follow up is advised on a p.r.n. basis. Dictated by:    Nelwyn Salisbury, M.D. Attending:  Nelwyn Salisbury, M.D. DD:  01/29/01 TD:  01/29/01 Job: [id number]

## 2010-10-14 NOTE — Op Note (Signed)
NAME:  [name], [name]                        ACCOUNT NO.:  [id number]   MEDICAL RECORD NO.:  [id number]                   PATIENT TYPE:  AMB   LOCATION:  ENDO                                 FACILITY:  [facility]   PHYSICIAN:  Nelwyn Salisbury, M.D.               DATE OF BIRTH:  April 22, 1941   DATE OF PROCEDURE:  02/05/2004  DATE OF DISCHARGE:                                 OPERATIVE REPORT   PROCEDURE PERFORMED:  Esophagogastroduodenoscopy with biopsy.   ENDOSCOPIST:  Nelwyn Salisbury, M.D.   INSTRUMENT USED:  Olympus video endoscope.   INDICATIONS FOR PROCEDURE:  A 70 year old African-American female with a  history of severe epigastric pain and post prandial worsening of symptoms,  rule out peptic ulcer disease, esophagitis, gastritis, etc. The patient has  a low EF on HIDA scan, question biliary dyskinesia.   PREPROCEDURE PREPARATION:  Informed consent was procured from the patient.  The patient was fasted for 8 hours prior to the procedure.   PREPROCEDURE PHYSICAL:  VITAL SIGNS:  The patient had stable vital signs.  NECK:  Supple.  CHEST:  Clear to auscultation. S1 and S2 regular.  ABDOMEN:  Soft with normal bowel sounds.   DESCRIPTION OF PROCEDURE:  The patient was placed in left lateral decubitus  position and sedated with __________ mg of Demerol and 2.5 mg of Versed in  slow incremental doses. Once the patient was adequately sedated and  maintained on low flow oxygen and continuous cardiac monitoring, the Olympus  videoscope was advanced through the mouth piece, over the tongue, into the  esophagus under direct vision. The entire esophagus appeared normal with no  evidence of ring, stricture, mass, esophagitis, or Barrett's mucosa. The  scope was then advanced into the stomach. There was severe gastritis of the  gastric mucosa. Biopsies were done to rule out presence of helicobacter  pylori by pathology. No ulcers, erosions, masses, or polyps were seen. The  proximal small  bowel appeared normal. There was no outlet obstruction. The  patient tolerated the procedure well without immediate complications.   IMPRESSION:  1.  Severe gastritis. Biopsy sent for helicobacter pylori.  2.  No masses, polyps, or ulcers seen.  3.  Normal appearing esophagus in proximal small bowel.   RECOMMENDATIONS:  1.  Awake pathology results.  2.  Avoid nonsteroidals including aspirin for now.  3.  Treat with antibiotics if helicobacter pylori present on biopsy.  4.  Outpatient followup within the next week for further recommendations.                                               Nelwyn Salisbury, M.D.    JNM/MEDQ  D:  02/06/2004  T:  02/07/2004  Job:  [id number]   cc:   Ardyth Gal. Spruill,  M.D.  [address] [place] [id number]  Fax: [phone]

## 2010-10-19 ENCOUNTER — Ambulatory Visit: Payer: Medicare Other

## 2010-11-01 ENCOUNTER — Ambulatory Visit: Payer: Medicare Other | Attending: Family Medicine

## 2010-11-01 DIAGNOSIS — M545 Low back pain, unspecified: Secondary | ICD-10-CM | POA: Insufficient documentation

## 2010-11-01 DIAGNOSIS — IMO0001 Reserved for inherently not codable concepts without codable children: Secondary | ICD-10-CM | POA: Insufficient documentation

## 2010-11-01 DIAGNOSIS — M25569 Pain in unspecified knee: Secondary | ICD-10-CM | POA: Insufficient documentation

## 2010-11-01 DIAGNOSIS — R5381 Other malaise: Secondary | ICD-10-CM | POA: Insufficient documentation

## 2010-11-08 ENCOUNTER — Ambulatory Visit: Payer: Medicare Other | Admitting: Physical Therapy

## 2010-11-24 ENCOUNTER — Telehealth: Payer: Self-pay | Admitting: *Deleted

## 2010-11-25 NOTE — Telephone Encounter (Signed)
Callback

## 2010-12-24 ENCOUNTER — Encounter: Payer: Self-pay | Admitting: Family Medicine

## 2011-01-23 ENCOUNTER — Encounter: Payer: Medicare Other | Admitting: Internal Medicine

## 2011-02-01 ENCOUNTER — Encounter: Payer: Medicare Other | Admitting: Internal Medicine

## 2011-02-23 ENCOUNTER — Encounter: Payer: Self-pay | Admitting: Vascular Surgery

## 2011-03-13 LAB — COMPREHENSIVE METABOLIC PANEL
AST: 13
Albumin: 3.2 — ABNORMAL LOW
BUN: 10
CO2: 28
Calcium: 9.2
Creatinine, Ser: 1
GFR calc Af Amer: >60
GFR calc non Af Amer: 55 — ABNORMAL LOW

## 2011-03-13 LAB — DIFFERENTIAL
Eosinophils Relative: 2
Lymphocytes Relative: 44
Lymphs Abs: 3.2
Neutro Abs: 3.5

## 2011-03-13 LAB — POCT CARDIAC MARKERS
CKMB, poc: <1 — ABNORMAL LOW
Operator id: 4074
Troponin i, poc: <0.05

## 2011-03-13 LAB — CBC
MCHC: 34.2
MCV: 88.2
Platelets: 209

## 2011-03-28 ENCOUNTER — Encounter: Payer: Medicare Other | Admitting: Internal Medicine

## 2011-04-12 ENCOUNTER — Encounter: Payer: Self-pay | Admitting: Vascular Surgery

## 2011-04-13 ENCOUNTER — Encounter: Payer: Self-pay | Admitting: Vascular Surgery

## 2011-04-13 ENCOUNTER — Encounter (INDEPENDENT_AMBULATORY_CARE_PROVIDER_SITE_OTHER): Payer: Medicare Other | Admitting: *Deleted

## 2011-04-13 ENCOUNTER — Ambulatory Visit (INDEPENDENT_AMBULATORY_CARE_PROVIDER_SITE_OTHER): Payer: Medicare Other | Admitting: Vascular Surgery

## 2011-04-13 VITALS — BP 163/83 | HR 76 | Resp 18 | Ht 67.5 in | Wt 178.0 lb

## 2011-04-13 DIAGNOSIS — I70219 Atherosclerosis of native arteries of extremities with intermittent claudication, unspecified extremity: Secondary | ICD-10-CM

## 2011-04-13 DIAGNOSIS — I739 Peripheral vascular disease, unspecified: Secondary | ICD-10-CM

## 2011-04-13 NOTE — Progress Notes (Signed)
[facility] [place] HISTORY AND PHYSICAL   History of Present Illness:  Patient is a 70 y.o. year old female who presents for follow-up evaluation for PAD.  She is on Aspirin for antiplatelet therapy. Her atherosclerotic risk factors remain diabetes, elevated cholesterol, hypertension, smoking.    These are all currently stable and followed by the primary care physician.  She has started an exercise program and is walking daily. She states that she no longer has limitations on her walking distance. She states that her legs do fatigue but she did not really describe claudication symptoms.The patient denies rest pain or ulcers on the feet. She has also lost 15 pounds or dietary changes and states that this has helped with her diabetes as well as her aches and pains in her legs. I congratulated her on this.  Past Medical History  Diagnosis Date  . Peripheral vascular disease   . Diabetes mellitus   . Hyperlipidemia   . Hypertension   . TIA (transient ischemic attack)   . Arthritis     osteoarthritis    Past Surgical History  Procedure Date  . Joint replacement     left knee  . Uterine tumor   . Ventral hernia repair   . Abdominal aortic aneurysm repair   . Abdominal hysterectomy     partial  . Tubal ligation   . Back surgery     Review of Systems:  Neurologic: sensation in the feet is intact Cardiac:denies shortness of breath or chest pain Pulmonary: denies cough or wheeze  Social History History  Substance Use Topics  . Smoking status: Current Everyday Smoker  . Smokeless tobacco: Not on file  . Alcohol Use:     Allergies  Allergies  Allergen Reactions  . Codeine      Current Outpatient Prescriptions  Medication Sig Dispense Refill  . aspirin 81 MG tablet Take 81 mg by mouth daily.        . calcium citrate-vitamin D (CITRACAL+D) 315-200 MG-UNIT per tablet Take 1 tablet by mouth 2 (two) times daily.        . Cyanocobalamin (VITAMIN B 12 PO)  Take 50 mg by mouth daily.        Marland Kitchen esomeprazole (NEXIUM) 40 MG capsule Take 40 mg by mouth daily before breakfast.        . glimepiride (AMARYL) 2 MG tablet Take 2 mg by mouth daily before breakfast.        . ibuprofen (ADVIL,MOTRIN) 800 MG tablet Take 800 mg by mouth every 8 (eight) hours as needed.        . metoprolol (TOPROL-XL) 50 MG 24 hr tablet Take 50 mg by mouth daily.        Marland Kitchen lisinopril (PRINIVIL,ZESTRIL) 20 MG tablet Take 20 mg by mouth daily.        . metFORMIN (GLUCOPHAGE) 1000 MG tablet Take 1,000 mg by mouth 2 (two) times daily with a meal.        . rosuvastatin (CRESTOR) 20 MG tablet Take 20 mg by mouth daily.          Physical Examination  Filed Vitals:   04/13/11 1115  BP: 163/83  Pulse: 76  Resp: 18  Height: 5' 7.5" (1.715 m)  Weight: 178 lb (80.74 kg)  SpO2: 98%    Body mass index is 27.47 kg/(m^2).  General:  Alert and oriented, no acute distress HEENT: Normal Neck: No bruit or JVD Pulmonary: Clear to auscultation bilaterally Cardiac: Regular  Rate and Rhythm without murmur Neurologic: Upper and lower extremity motor 5/5 and symmetric Extremities:  2+ femoral pulses absent pop and pedal Skin: no ulcer or rash  DATA:   She had bilateral ABIs today which I reviewed and interpreted they were 0.61 on the right 0.72 on the left both of which are unchanged from May of 2012  ASSESSMENT: Peripheral arterial disease significantly improved with walking program   PLAN: Continue walking program as well as risk factor modification. She'll return for six-month ABIs and see me again in 2 years  Ruta Hinds, MD [facility] [place]: [phone] Pager: [phone]

## 2011-10-04 ENCOUNTER — Other Ambulatory Visit: Payer: Self-pay | Admitting: *Deleted

## 2011-10-04 DIAGNOSIS — I7092 Chronic total occlusion of artery of the extremities: Secondary | ICD-10-CM

## 2011-10-18 ENCOUNTER — Encounter: Payer: Self-pay | Admitting: Neurosurgery

## 2011-10-19 ENCOUNTER — Encounter: Payer: Self-pay | Admitting: Neurosurgery

## 2011-10-19 ENCOUNTER — Ambulatory Visit (INDEPENDENT_AMBULATORY_CARE_PROVIDER_SITE_OTHER): Payer: PRIVATE HEALTH INSURANCE | Admitting: Neurosurgery

## 2011-10-19 ENCOUNTER — Encounter (INDEPENDENT_AMBULATORY_CARE_PROVIDER_SITE_OTHER): Payer: PRIVATE HEALTH INSURANCE | Admitting: *Deleted

## 2011-10-19 VITALS — BP 198/103 | HR 69 | Resp 16 | Ht 67.5 in | Wt 174.9 lb

## 2011-10-19 DIAGNOSIS — I739 Peripheral vascular disease, unspecified: Secondary | ICD-10-CM

## 2011-10-19 DIAGNOSIS — I779 Disorder of arteries and arterioles, unspecified: Secondary | ICD-10-CM

## 2011-10-19 DIAGNOSIS — I70219 Atherosclerosis of native arteries of extremities with intermittent claudication, unspecified extremity: Secondary | ICD-10-CM

## 2011-10-19 DIAGNOSIS — I7092 Chronic total occlusion of artery of the extremities: Secondary | ICD-10-CM

## 2011-10-19 NOTE — Progress Notes (Signed)
Addended by: Mena Goes on: 10/19/2011 01:24 PM   Modules accepted: Orders

## 2011-10-19 NOTE — Progress Notes (Signed)
[facility] [place] HISTORY AND PHYSICAL   CC: Six-month ABIs for surveillance of known PAD Referring Physician: Fields  History of Present Illness: 71 year old female patient of Dr. Oneida Alar followed for medical management and serial ABIs of known PAD in the bilateral lower extremities. Patient states that her pain with walking is improved. She is now taking vitamins that she states she feels has helped "straighten her feet out", she also thinks that her walking has improved since starting these vitamins. She denies claudication, she has no open wounds on her feet or legs and has no complaints of rest pain.  Past Medical History  Diagnosis Date  . Peripheral vascular disease   . Diabetes mellitus   . Hyperlipidemia   . Hypertension   . TIA (transient ischemic attack)   . Arthritis     osteoarthritis  . Aneurysm     ROS: [x]  Positive   [ ]  Denies    General: [ ]  Weight loss, [ ]  Fever, [ ]  chills Neurologic: [ ]  Dizziness, [ ]  Blackouts, [ ]  Seizure [ ]  Stroke, [ ]  "Mini stroke", [ ]  Slurred speech, [ ]  Temporary blindness; [ ]  weakness in arms or legs, [ ]  Hoarseness Cardiac: [ ]  Chest pain/pressure, [ ]  Shortness of breath at rest [ ]  Shortness of breath with exertion, [ ]  Atrial fibrillation or irregular heartbeat Vascular: [ ]  Pain in legs with walking, [ ]  Pain in legs at rest, [ ]  Pain in legs at night,  [ ]  Non-healing ulcer, [ ]  Blood clot in vein/DVT,   Pulmonary: [ ]  Home oxygen, [ ]  Productive cough, [ ]  Coughing up blood, [ ]  Asthma,  [ ]  Wheezing Musculoskeletal:  [ ]  Arthritis, [ ]  Low back pain, [ ]  Joint pain Hematologic: [ ]  Easy Bruising, [ ]  Anemia; [ ]  Hepatitis Gastrointestinal: [ ]  Blood in stool, [ ]  Gastroesophageal Reflux/heartburn, [ ]  Trouble swallowing Urinary: [ ]  chronic Kidney disease, [ ]  on HD - [ ]  MWF or [ ]  TTHS, [ ]  Burning with urination, [ ]  Difficulty urinating Skin: [ ]  Rashes, [ ]  Wounds Psychological: [ ]  Anxiety, [ ]   Depression   Social History History  Substance Use Topics  . Smoking status: Current Everyday Smoker  . Smokeless tobacco: Not on file  . Alcohol Use: No    Family History Family History  Problem Relation Age of Onset  . Diabetes Mother   . Hypertension Father   . Hyperlipidemia Sister   . Hypertension Sister   . Aneurysm Sister   . Hyperlipidemia Brother   . Hypertension Brother     Allergies  Allergen Reactions  . Codeine     Current Outpatient Prescriptions  Medication Sig Dispense Refill  . aspirin 81 MG tablet Take 81 mg by mouth daily.        . calcium citrate-vitamin D (CITRACAL+D) 315-200 MG-UNIT per tablet Take 1 tablet by mouth 2 (two) times daily.        . Cyanocobalamin (VITAMIN B 12 PO) Take 50 mg by mouth daily.        Marland Kitchen esomeprazole (NEXIUM) 40 MG capsule Take 40 mg by mouth daily before breakfast.        . glimepiride (AMARYL) 2 MG tablet Take 2 mg by mouth daily before breakfast.        . ibuprofen (ADVIL,MOTRIN) 800 MG tablet Take 800 mg by mouth every 8 (eight) hours as needed.        Marland Kitchen  lisinopril (PRINIVIL,ZESTRIL) 20 MG tablet Take 20 mg by mouth daily.        . metFORMIN (GLUCOPHAGE) 1000 MG tablet Take 1,000 mg by mouth 2 (two) times daily with a meal.        . metoprolol (TOPROL-XL) 50 MG 24 hr tablet Take 50 mg by mouth daily.        . rosuvastatin (CRESTOR) 20 MG tablet Take 20 mg by mouth daily.          Physical Examination  Filed Vitals:   10/19/11 1109  BP: 198/103  Pulse: 69  Resp: 16    Body mass index is 26.99 kg/(m^2).  General:  WDWN in NAD Gait: Normal HEENT: WNL Eyes: Pupils equal Pulmonary: normal non-labored breathing , without Rales, rhonchi,  wheezing Cardiac: RRR, without  Murmurs, rubs or gallops; No carotid bruits Abdomen: soft, NT, no masses Skin: no rashes, ulcers noted Vascular Exam/Pulses: 2+ femoral pulses bilaterally, DP and PT pulses are not palpable  Extremities without ischemic changes, no Gangrene ,  no cellulitis; no open wounds;  Musculoskeletal: no muscle wasting or atrophy  Neurologic: A&O X 3; Appropriate Affect ; SENSATION: normal; MOTOR FUNCTION:  moving all extremities equally. Speech is fluent/normal  Non-Invasive Vascular Imaging: ABIs today are somewhat improved in November of 2012 she was 0.61 on the right 0.72 on the left, today she is 0.61 on the right and 0.83 on the left both monophasic. Her toe pressures today are 90 on the right 112 on the left  ASSESSMENT/PLAN: Stable if not improved lower extremity PAD with improved ABIs. Patient is asymptomatic for claudication, she will followup here in 6 months for repeat lower extremity ABIs. She is in agreement with this, her questions were encouraged and answered.  Beatris Ship ANP  Clinic M.D.: [facility]

## 2012-04-05 ENCOUNTER — Emergency Department (HOSPITAL_COMMUNITY)
Admission: EM | Admit: 2012-04-05 | Discharge: 2012-04-05 | Disposition: A | Discharge status: Home / Self Care | Payer: PRIVATE HEALTH INSURANCE | Attending: Emergency Medicine | Admitting: Emergency Medicine

## 2012-04-05 ENCOUNTER — Emergency Department (HOSPITAL_COMMUNITY): Payer: PRIVATE HEALTH INSURANCE

## 2012-04-05 ENCOUNTER — Encounter (HOSPITAL_COMMUNITY): Payer: Self-pay | Admitting: Emergency Medicine

## 2012-04-05 DIAGNOSIS — Z8673 Personal history of transient ischemic attack (TIA), and cerebral infarction without residual deficits: Secondary | ICD-10-CM | POA: Insufficient documentation

## 2012-04-05 DIAGNOSIS — M129 Arthropathy, unspecified: Secondary | ICD-10-CM | POA: Insufficient documentation

## 2012-04-05 DIAGNOSIS — E119 Type 2 diabetes mellitus without complications: Secondary | ICD-10-CM | POA: Insufficient documentation

## 2012-04-05 DIAGNOSIS — E785 Hyperlipidemia, unspecified: Secondary | ICD-10-CM | POA: Insufficient documentation

## 2012-04-05 DIAGNOSIS — Z7982 Long term (current) use of aspirin: Secondary | ICD-10-CM | POA: Insufficient documentation

## 2012-04-05 DIAGNOSIS — I10 Essential (primary) hypertension: Secondary | ICD-10-CM

## 2012-04-05 DIAGNOSIS — H538 Other visual disturbances: Secondary | ICD-10-CM | POA: Insufficient documentation

## 2012-04-05 DIAGNOSIS — F17200 Nicotine dependence, unspecified, uncomplicated: Secondary | ICD-10-CM | POA: Insufficient documentation

## 2012-04-05 DIAGNOSIS — I739 Peripheral vascular disease, unspecified: Secondary | ICD-10-CM | POA: Insufficient documentation

## 2012-04-05 DIAGNOSIS — Z79899 Other long term (current) drug therapy: Secondary | ICD-10-CM | POA: Insufficient documentation

## 2012-04-05 LAB — URINALYSIS, ROUTINE W REFLEX MICROSCOPIC
Glucose, UA: NEGATIVE mg/dL
Hgb urine dipstick: NEGATIVE
Leukocytes, UA: NEGATIVE
Specific Gravity, Urine: 1.008 (ref 1.005–1.030)
pH: 6 (ref 5.0–8.0)

## 2012-04-05 LAB — POCT I-STAT, CHEM 8
BUN: 11 mg/dL (ref 6–23)
Chloride: 106 mEq/L (ref 96–112)
HCT: 42 % (ref 36.0–46.0)
Sodium: 145 mEq/L (ref 135–145)

## 2012-04-05 LAB — CBC WITH DIFFERENTIAL/PLATELET
Basophils Absolute: 0.1 10*3/uL (ref 0.0–0.1)
Basophils Relative: 1 % (ref 0–1)
HCT: 39.3 % (ref 36.0–46.0)
Lymphocytes Relative: 40 % (ref 12–46)
MCHC: 34.4 g/dL (ref 30.0–36.0)
Monocytes Absolute: 0.6 10*3/uL (ref 0.1–1.0)
Neutro Abs: 4.4 10*3/uL (ref 1.7–7.7)
Neutrophils Relative %: 50 % (ref 43–77)
Platelets: 167 10*3/uL (ref 150–400)
RDW: 13.7 % (ref 11.5–15.5)
WBC: 8.9 10*3/uL (ref 4.0–10.5)

## 2012-04-05 LAB — TROPONIN I: Troponin I: <0.3 ng/mL (ref <0.30)

## 2012-04-05 MED ORDER — LISINOPRIL 20 MG PO TABS: 20.0000 mg | ORAL_TABLET | Freq: Every day | ORAL | Status: DC

## 2012-04-05 NOTE — ED Notes (Signed)
Patient left without getting discharged paperwork but was given verbal instructions per Dr. Alvino Chapel.

## 2012-04-05 NOTE — ED Notes (Signed)
Pt remains in waiting room, still c/o blurred vision, but says, "i'm okay."

## 2012-04-05 NOTE — ED Provider Notes (Addendum)
History     CSN: [id number]  Arrival date & time 04/05/12  1031   First MD Initiated Contact with Patient 04/05/12 1203      No chief complaint on file.   (Consider location/radiation/quality/duration/timing/severity/associated sxs/prior treatment) The history is provided by the patient.   patient was sent in by her primary care Dr. for high blood pressure. Blood pressure was reportedly 220/110. Patient states that she has no chest pain. She states she's been under a lot of stress. She states that she has had some blurred vision. She also has a throbbing in the back of her head. Patient states she saw her eye doctor states she had some bleeding in her eye and will need to see a specialist. She states they did not check the blood pressure there. No numbness or weakness. No lightheadedness or dizziness.  Past Medical History  Diagnosis Date  . Peripheral vascular disease   . Diabetes mellitus   . Hyperlipidemia   . Hypertension   . TIA (transient ischemic attack)   . Arthritis     osteoarthritis  . Aneurysm     Past Surgical History  Procedure Date  . Joint replacement     left knee  . Uterine tumor   . Ventral hernia repair   . Abdominal aortic aneurysm repair   . Abdominal hysterectomy     partial  . Tubal ligation   . Back surgery     Family History  Problem Relation Age of Onset  . Diabetes Mother   . Hypertension Father   . Hyperlipidemia Sister   . Hypertension Sister   . Aneurysm Sister   . Hyperlipidemia Brother   . Hypertension Brother     History  Substance Use Topics  . Smoking status: Current Every Day Smoker  . Smokeless tobacco: Not on file  . Alcohol Use: No    OB History    Grav Para Term Preterm Abortions TAB SAB Ect Mult Living                  Review of Systems  Constitutional: Negative for activity change and appetite change.  HENT: Negative for neck stiffness.   Eyes: Negative for pain.  Respiratory: Negative for chest tightness  and shortness of breath.   Cardiovascular: Negative for chest pain and leg swelling.  Gastrointestinal: Negative for nausea, vomiting, abdominal pain and diarrhea.  Genitourinary: Negative for flank pain.  Musculoskeletal: Negative for back pain.  Skin: Negative for rash.  Neurological: Negative for weakness, numbness and headaches.  Psychiatric/Behavioral: Negative for behavioral problems.    Allergies  Codeine  Home Medications   Current Outpatient Rx  Name  Route  Sig  Dispense  Refill  . ASPIRIN 81 MG PO TABS   Oral   Take 81 mg by mouth daily. Sometimes takes 1 tablet three times daily if needed for chest pain         . B COMPLEX-C PO TABS   Oral   Take 1 tablet by mouth daily.         Marland Kitchen CINNAMON PO   Oral   Take 1 tablet by mouth daily.         Marland Kitchen GARLIC PO   Oral   Take 1 tablet by mouth daily.         Marland Kitchen GLIMEPIRIDE 2 MG PO TABS   Oral   Take 2 mg by mouth See admin instructions. Takes 1 tablet once daily but will take 2 tablets  if blood sugar is elevated.         Marland Kitchen METOPROLOL SUCCINATE ER 50 MG PO TB24   Oral   Take 50 mg by mouth daily.           Marland Kitchen LISINOPRIL 20 MG PO TABS   Oral   Take 1 tablet (20 mg total) by mouth daily.   14 tablet   0     BP 167/72  Pulse 55  Temp 97.7 F (36.5 C) (Oral)  Resp 20  SpO2 100%  Physical Exam  Nursing note and vitals reviewed. Constitutional: She is oriented to person, place, and time. She appears well-developed and well-nourished.  HENT:  Head: Normocephalic and atraumatic.  Eyes: EOM are normal. Pupils are equal, round, and reactive to light.  Neck: Normal range of motion. Neck supple.  Cardiovascular: Normal rate, regular rhythm and normal heart sounds.   No murmur heard. Pulmonary/Chest: Effort normal and breath sounds normal. No respiratory distress. She has no wheezes. She has no rales.  Abdominal: Soft. Bowel sounds are normal. She exhibits no distension. There is no tenderness. There is  no rebound and no guarding.  Musculoskeletal: Normal range of motion.  Neurological: She is alert and oriented to person, place, and time. No cranial nerve deficit.  Skin: Skin is warm and dry.  Psychiatric: She has a normal mood and affect. Her speech is normal.    ED Course  Procedures (including critical care time)  Labs Reviewed  POCT I-STAT, CHEM 8 - Abnormal; Notable for the following:    Glucose, Bld 104 (*)     All other components within normal limits  CBC WITH DIFFERENTIAL  URINALYSIS, ROUTINE W REFLEX MICROSCOPIC  TROPONIN I   Dg Chest 2 View  04/05/2012  *RADIOLOGY REPORT*  Clinical Data: Elevated blood pressure  CHEST - 2 VIEW  Comparison: 04/07/2010  Findings: Cardiomediastinal silhouette is stable.  No acute infiltrate or pleural effusion.  No pulmonary edema.  Minimal degenerative changes thoracic spine.  IMPRESSION: No active disease.  Minimal degenerative changes thoracic spine.   Original Report Authenticated By: Lahoma Crocker, M.D.    Ct Head Wo Contrast  04/05/2012  *RADIOLOGY REPORT*  Clinical Data: Headache.  Blurred vision.  Throbbing sensation.  CT HEAD WITHOUT CONTRAST  Technique:  Contiguous axial images were obtained from the base of the skull through the vertex without contrast.  Comparison: 03/11/2009  Findings: No sign of acute infarction, mass lesion, hemorrhage, hydrocephalus or extra-axial collection.  There are mild chronic appearing small vessel changes within the deep brain.  There is atherosclerotic calcification of the major vessels at the base of the brain.  Visualized sinuses, middle ears and mastoids are clear. No calvarial lesion.  IMPRESSION: No acute finding.  No cause of headache identified.  Ordinary mild chronic appearing small vessel change within the deep brain.   Original Report Authenticated By: Nelson Chimes, M.D.      1. Hypertension       MDM  Patient hypertension. No clear endorgan damage although there was reportedly some bleeding in  her eyes. She has been off some of her blood pressure medications. The medicine has been restarted. Head CT reassuring  Patient was discharged to follow with her primary care Dr.   Date: 04/05/2012  Rate: 55  Rhythm: sinus bradycardia  QRS Axis: normal  Intervals: normal  ST/T Wave abnormalities: normal  Conduction Disutrbances:LVH  Narrative Interpretation:   Old EKG Reviewed: none available  Jasper Riling. Alvino Chapel, MD 04/05/12 [place] Alvino Chapel, MD 04/24/12 [id number]

## 2012-04-05 NOTE — ED Notes (Signed)
Patient states "vision better and headache better since lying down and resting".  States "had a lot of extra salt yesterday. Denies chest pain, shortness of breath or further concerns.  Requesting to go home at present.

## 2012-04-05 NOTE — ED Notes (Signed)
Was seen at her dr office today and was  Told she had high bp and needed to be seen for this has been underalot of stress and had been taking cold meds ex husband dying

## 2012-04-18 ENCOUNTER — Ambulatory Visit: Payer: PRIVATE HEALTH INSURANCE | Admitting: Neurosurgery

## 2012-04-19 ENCOUNTER — Encounter: Payer: Self-pay | Admitting: Neurosurgery

## 2012-04-22 ENCOUNTER — Ambulatory Visit: Payer: PRIVATE HEALTH INSURANCE | Admitting: Neurosurgery

## 2012-04-25 ENCOUNTER — Emergency Department (HOSPITAL_COMMUNITY): Payer: PRIVATE HEALTH INSURANCE

## 2012-04-25 ENCOUNTER — Emergency Department (HOSPITAL_COMMUNITY)
Admission: EM | Admit: 2012-04-25 | Discharge: 2012-04-25 | Discharge status: Against Medical Advice | Payer: PRIVATE HEALTH INSURANCE | Attending: Emergency Medicine | Admitting: Emergency Medicine

## 2012-04-25 ENCOUNTER — Encounter (HOSPITAL_COMMUNITY): Payer: Self-pay | Admitting: Emergency Medicine

## 2012-04-25 DIAGNOSIS — F17200 Nicotine dependence, unspecified, uncomplicated: Secondary | ICD-10-CM | POA: Insufficient documentation

## 2012-04-25 DIAGNOSIS — I10 Essential (primary) hypertension: Secondary | ICD-10-CM | POA: Insufficient documentation

## 2012-04-25 DIAGNOSIS — I7779 Dissection of other artery: Secondary | ICD-10-CM | POA: Insufficient documentation

## 2012-04-25 DIAGNOSIS — E119 Type 2 diabetes mellitus without complications: Secondary | ICD-10-CM | POA: Insufficient documentation

## 2012-04-25 DIAGNOSIS — M549 Dorsalgia, unspecified: Secondary | ICD-10-CM | POA: Insufficient documentation

## 2012-04-25 DIAGNOSIS — R079 Chest pain, unspecified: Secondary | ICD-10-CM | POA: Insufficient documentation

## 2012-04-25 DIAGNOSIS — Z79899 Other long term (current) drug therapy: Secondary | ICD-10-CM | POA: Insufficient documentation

## 2012-04-25 DIAGNOSIS — R112 Nausea with vomiting, unspecified: Secondary | ICD-10-CM | POA: Insufficient documentation

## 2012-04-25 DIAGNOSIS — R1013 Epigastric pain: Secondary | ICD-10-CM

## 2012-04-25 DIAGNOSIS — Z8673 Personal history of transient ischemic attack (TIA), and cerebral infarction without residual deficits: Secondary | ICD-10-CM | POA: Insufficient documentation

## 2012-04-25 DIAGNOSIS — I7770 Dissection of unspecified artery: Secondary | ICD-10-CM

## 2012-04-25 DIAGNOSIS — Z7982 Long term (current) use of aspirin: Secondary | ICD-10-CM | POA: Insufficient documentation

## 2012-04-25 DIAGNOSIS — Z8739 Personal history of other diseases of the musculoskeletal system and connective tissue: Secondary | ICD-10-CM | POA: Insufficient documentation

## 2012-04-25 DIAGNOSIS — E785 Hyperlipidemia, unspecified: Secondary | ICD-10-CM | POA: Insufficient documentation

## 2012-04-25 HISTORY — DX: Abdominal aortic aneurysm, without rupture, unspecified: I71.40

## 2012-04-25 HISTORY — DX: Cerebral infarction, unspecified: I63.9

## 2012-04-25 HISTORY — DX: Abdominal aortic aneurysm, without rupture: I71.4

## 2012-04-25 LAB — BASIC METABOLIC PANEL
BUN: 14 mg/dL (ref 6–23)
CO2: 27 mEq/L (ref 19–32)
Chloride: 101 mEq/L (ref 96–112)
Creatinine, Ser: 1.05 mg/dL (ref 0.50–1.10)
GFR calc Af Amer: 60 mL/min — ABNORMAL LOW (ref >90)
Potassium: 4.1 mEq/L (ref 3.5–5.1)

## 2012-04-25 LAB — TROPONIN I
Troponin I: <0.3 ng/mL (ref <0.30)
Troponin I: <0.3 ng/mL (ref <0.30)

## 2012-04-25 LAB — CBC
HCT: 39.1 % (ref 36.0–46.0)
MCV: 88.5 fL (ref 78.0–100.0)
Platelets: 187 10*3/uL (ref 150–400)
RBC: 4.42 MIL/uL (ref 3.87–5.11)
WBC: 8.2 10*3/uL (ref 4.0–10.5)

## 2012-04-25 MED ORDER — IOHEXOL 350 MG/ML SOLN
80.0000 mL | Freq: Once | INTRAVENOUS | Status: AC | PRN
  Administered 2012-04-25: 80 mL via INTRAVENOUS

## 2012-04-25 MED ORDER — PANTOPRAZOLE SODIUM 40 MG PO TBEC
40.0000 mg | DELAYED_RELEASE_TABLET | Freq: Once | ORAL | Status: AC
  Administered 2012-04-25: 40 mg via ORAL
  Filled 2012-04-25: qty 1

## 2012-04-25 MED ORDER — LABETALOL HCL 5 MG/ML IV SOLN
10.0000 mg | Freq: Once | INTRAVENOUS | Status: DC
  Filled 2012-04-25: qty 4

## 2012-04-25 MED ORDER — ASPIRIN 325 MG PO TABS
325.0000 mg | ORAL_TABLET | Freq: Once | ORAL | Status: AC
  Administered 2012-04-25: 325 mg via ORAL
  Filled 2012-04-25: qty 1

## 2012-04-25 NOTE — Consult Note (Signed)
CARDIOTHORACIC SURGERY CONSULTATION REPORT  PCP is Lilian Coma, MD Referring Provider is EMERGENCY DEPT   Reason for consultation:  Localized dissection of subclavian artery  HPI:  Patient is a 71 year old African American female from [place] with multiple medical problems including known history of hypertension, hyperlipidemia, type 2 diabetes mellitus, peripheral vascular disease with previous repair of infrarenal abdominal aortic aneurysm was in her usual state of health until this morning when she states she developed burning epigastric pain after she 8 a piece of pie. The pain persisted and radiated through to her back. The pain was not associated with any shortness of breath diaphoresis.  The patient initially took Nexium but then subsequently used sublingual nitroglycerin x2. Pain has since then subsided considerably but is not completely resolved. The patient denies any chest pain, arm pain, shoulder pain or shortness of breath.  Remainder of review of systems is noncontributory.  She wants to go home.  Past Medical History  Diagnosis Date  . Peripheral vascular disease   . Diabetes mellitus   . Hyperlipidemia   . Hypertension   . TIA (transient ischemic attack)   . Arthritis     osteoarthritis  . Aneurysm   . Stroke   . AAA (abdominal aortic aneurysm)     Past Surgical History  Procedure Date  . Joint replacement     left knee  . Uterine tumor   . Ventral hernia repair   . Abdominal aortic aneurysm repair   . Abdominal hysterectomy     partial  . Tubal ligation   . Back surgery   . Abdominal aortic aneurysm repair     Family History  Problem Relation Age of Onset  . Diabetes Mother   . Hypertension Father   . Hyperlipidemia Sister   . Hypertension Sister   . Aneurysm Sister   . Hyperlipidemia Brother   . Hypertension Brother     History   Social History  . Marital Status: Divorced    Spouse Name: N/A    Number of Children: N/A  .  Years of Education: N/A   Occupational History  . Not on file.   Social History Main Topics  . Smoking status: Current Every Day Smoker  . Smokeless tobacco: Not on file  . Alcohol Use: No  . Drug Use: No  . Sexually Active:    Other Topics Concern  . Not on file   Social History Narrative  . No narrative on file    Prior to Admission medications   Medication Sig Start Date End Date Taking? Authorizing Provider  aspirin 81 MG tablet Take 81 mg by mouth daily. Sometimes takes 1 tablet three times daily if needed for chest pain   Yes Historical Provider, MD  B Complex-C (B-COMPLEX WITH VITAMIN C) tablet Take 1 tablet by mouth daily.   Yes Historical Provider, MD  cholecalciferol (VITAMIN D) 1000 UNITS tablet Take 1,000 Units by mouth daily.   Yes Historical Provider, MD  CINNAMON PO Take 1 tablet by mouth daily.   Yes Historical Provider, MD  GARLIC PO Take 1 tablet by mouth daily.   Yes Historical Provider, MD  glimepiride (AMARYL) 2 MG tablet Take 2 mg by mouth See admin instructions. Takes 1 tablet once daily but will take 2 tablets if blood sugar is elevated.   Yes Historical Provider, MD  metoprolol (TOPROL-XL) 50 MG 24 hr tablet Take 50 mg by mouth daily.  Yes Historical Provider, MD    Current Facility-Administered Medications  Medication Dose Route Frequency Provider Last Rate Last Dose  . [COMPLETED] aspirin tablet 325 mg  325 mg Oral Once Evelina Bucy, MD   325 mg at 04/25/12 1618  . [COMPLETED] iohexol (OMNIPAQUE) 350 MG/ML injection 80 mL  80 mL Intravenous Once PRN Medication Radiologist, MD   80 mL at 04/25/12 1710  . labetalol (NORMODYNE,TRANDATE) injection 10 mg  10 mg Intravenous Once Evelina Bucy, MD      . Margrett Rud pantoprazole (PROTONIX) EC tablet 40 mg  40 mg Oral Once Evelina Bucy, MD   40 mg at 04/25/12 1618   Current Outpatient Prescriptions  Medication Sig Dispense Refill  . aspirin 81 MG tablet Take 81 mg by mouth daily. Sometimes takes 1 tablet  three times daily if needed for chest pain      . B Complex-C (B-COMPLEX WITH VITAMIN C) tablet Take 1 tablet by mouth daily.      . cholecalciferol (VITAMIN D) 1000 UNITS tablet Take 1,000 Units by mouth daily.      Marland Kitchen CINNAMON PO Take 1 tablet by mouth daily.      Marland Kitchen GARLIC PO Take 1 tablet by mouth daily.      Marland Kitchen glimepiride (AMARYL) 2 MG tablet Take 2 mg by mouth See admin instructions. Takes 1 tablet once daily but will take 2 tablets if blood sugar is elevated.      . metoprolol (TOPROL-XL) 50 MG 24 hr tablet Take 50 mg by mouth daily.          Allergies  Allergen Reactions  . Codeine Other (See Comments)    Knocked her out of equilibrium. Made her world flip upside down  . Lisinopril Cough  . Penicillins     Makes exzcema worse        Physical Exam:   BP 173/79  Pulse 60  Temp 99 F (37.2 C) (Oral)  Resp 15  SpO2 97%  General:  Mildly obese  well-appearing  HEENT:  Unremarkable   Neck:   no JVD, no bruits, no adenopathy   Chest:   clear to auscultation, symmetrical breath sounds, no wheezes, no rhonchi   CV:   RRR, no murmur   Abdomen:  soft, non-tender, no masses   Extremities:  warm, well-perfused, pulses palpable in groin  Rectal/GU  Deferred  Neuro:   Grossly non-focal and symmetrical throughout  Skin:   Clean and dry, no rashes, no breakdown  Diagnostic Tests:  *RADIOLOGY REPORT*  Clinical Data: Epigastric pain with radiation to the back.  CT ANGIOGRAPHY CHEST  CT ABDOMEN AND PELVIS WITH CONTRAST  Technique: Multidetector CT imaging of the chest was performed  using the standard protocol during bolus administration of  intravenous contrast. Multiplanar CT image reconstructions  including MIPs were obtained to evaluate the vascular anatomy.  Multidetector CT imaging of the abdomen and pelvis was performed  using the standard protocol during bolus administration of  intravenous contrast.  Contrast: 4mL OMNIPAQUE IOHEXOL 350 MG/ML SOLN  Comparison: CT of  the abdomen and pelvis 06/05/2008.  CTA CHEST  Findings:  Mediastinum: On precontrast images there is no crescentic high  attenuation associated with the wall of the thoracic aorta to  suggest acute intramural hemorrhage. Aberrant right subclavian  artery is noted (normal anatomical variant). In the proximal aspect  of the aberrant vessel there is an unusual area of aneurysmal  dilatation which does not appear to represent a typical  diverticulum  of Kommerell. In addition, there is a short segment  dissection within this region as demonstrated on images 30 - 35 of  series 6. This aneurysmally dilated segment of the vessel measures  up to 2 cm in diameter. More distal aberrant right subclavian  artery is otherwise normal in appearance (the dissection flap only  extends approximately one centimeter into the vessel). No evidence  of acute dissection of the thoracic aorta itself. However, there is  a 7 x 8 mm lesion associated with the posterior wall of the  proximal descending thoracic aorta which likely represents some  thrombus associated with the posterior wall of the aorta. There is  a common origin of the common carotid arteries bilaterally from the  arch. No dissection flap in either of these vessels, or in the  left subclavian artery. Heart size is normal. There is no  significant pericardial fluid, thickening or pericardial  calcification. There is atherosclerosis of the thoracic aorta, the  great vessels of the mediastinum and the coronary arteries,  including calcified atherosclerotic plaque in the left anterior  descending and right coronary arteries. No pathologically enlarged  mediastinal or hilar lymph nodes. There is a small hiatal hernia.  Lungs/Pleura: Mild diffuse bronchial wall thickening. Mild -  moderate centrilobular and paraseptal emphysema. No acute  consolidative air space disease. No pleural effusions. No  definite suspicious appearing pulmonary nodules or masses  are  identified.  Musculoskeletal: There are no aggressive appearing lytic or blastic  lesions noted in the visualized portions of the skeleton.  Review of the MIP images confirms the above findings.  IMPRESSION:  1. 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  2. No evidence of thoracic aortic dissection. However, there is a  prominent exophytic mural thrombus extending into the lumen of the  proximal descending thoracic aorta, associated with the posterior  aspect of the wall. This likely puts the patient at increased risk  for distal embolic events.  3. Atherosclerosis, including left anterior descending and right  coronary artery disease.  4. Mild diffuse bronchial wall thickening with mild - moderate  centrilobular and paraseptal emphysema; imaging findings compatible  with underlying COPD.  5. Small hiatal hernia.  CT ABDOMEN AND PELVIS  Findings:  Abdomen/Pelvis: The appearance of the liver, gallbladder, spleen  and bilateral kidneys is unremarkable. Mild adreniform thickening  is noted in the adrenal glands bilaterally. In the head of the  pancreas there is a 1.5 x 1.1 cm hypervascular lesion (image 167 of  series 6). There are remainder of the pancreas is otherwise normal  in appearance, although the duct is borderline dilated (4 mm).  Normal appendix. No ascites or pneumoperitoneum and no pathologic  distension of small bowel. No definite pathologic lymphadenopathy  identified in the abdomen or pelvis on today's examination. There  is a small ventral hernia in the epigastric region containing a  short segment of mid small bowel, without evidence of incarceration  or obstruction. There is a wide-mouth ventral hernia beneath this,  which a portion of the mid transverse colon protrudes into.  Several other smaller ventral hernias are noted inferior to this,  one of which  contains a large amount of omental fat. Numerous  colonic diverticula are noted, without surrounding inflammatory  changes to suggest acute diverticulitis at this time. Status post  hysterectomy. Ovaries are not confidently identified, and may  either be surgically absent or atrophic.  The urinary bladder is  unremarkable in appearance.  Extensive atherosclerosis of the abdominal and pelvic vasculature.  Distal abdominal aortic graft is noted which begins below the level  of the renal arteries and terminates immediately above the aortic  bifurcation. There appears to be a hemodynamically significant  stenosis at the ostium of the right common iliac artery. There is  also likely a hemodynamically significant stenosis at the ostium of  the left renal artery. Right renal artery, celiac axis and  superior mesenteric arteries are all grossly patent. The ostium  and proximal portion of the inferior mesenteric artery is not  patent, however, the distal aspect of the vessel does fill with  contrast, presumably secondary to collateral flow.  Musculoskeletal: There are no aggressive appearing lytic or blastic  lesions noted in the visualized portions of the skeleton.  Multilevel degenerative disc disease, most notable at L4-L5 where  there is 4 mm of anterolisthesis of L4 upon L5.  Review of the MIP images confirms the above findings.  IMPRESSION:  1. No acute findings in the abdomen or pelvis to account for the  patient's symptoms.  2. Extensive atherosclerosis including probable hemodynamically  significant stenosis at the ostium of the left renal artery and  right common iliac arteries. Clinical correlation for signs and  symptoms of renovascular hypertension may be warranted.  3. 1.5 x 1.1 cm hypervascular lesion in the head of the pancreas  is similar in retrospect compared to remote prior studies dating  back to January 04, 2004, and is therefore favored to be benign.  Differential  considerations would favor a small benign  neuroendocrine tumor such as an insulinoma.  4. Multiple small ventral hernias, most notably there is one  containing a short segment of small bowel. At this time, there is  no evidence of bowel incarceration or obstruction.  5. Multilevel degenerative disc disease, as above, including 4 mm  of anterolisthesis of L4 upon L5.  6. Mild bilateral adreniform thickening, likely reflecting adrenal  hyperplasia.  Original Report Authenticated By: Vinnie Langton, M.D.    Impression:  CT angiogram of the chest demonstrates an aberrant right subclavian artery that has a very short segment focal dissection in the proximal segment of the vessel. There is no aortic dissection. There is a small mural thrombus in the proximal descending thoracic aorta of unclear significance and there is no obvious sign of distal embolization. There is no associated significant plaque nor sign of penetrating atherosclerotic ulcer.  I am skeptical that either of these findings have anything to do with the patient's presenting symptomatology. The patient is severely hypertensive, and under the circumstances I feel that it would be wise for the patient to be admitted to the hospital for strict blood pressure control and observation.  Plan:  I recommend admitting the patient to the hospital for strict blood pressure control while looking for other sources of the patient's presenting epigastric chest pain. Cardiology consultation would be wise. Elective vascular surgery consult prior to hospital discharge would also be appropriate for long term follow up, as the patient has seen Dr Oneida Alar in the past.  I will follow along while the patient is in the hospital and discuss with Dr Oneida Alar or his partners tomorrow.    Valentina Gu. Roxy Manns, MD 04/25/2012 6:41 PM

## 2012-04-25 NOTE — ED Notes (Signed)
Received pt from home with c/o epigastric pain that radiates to back onset around 1030 today. Pt took Nexium at home and 2 Nitro. Pt reports that she vomitied with the 1st Nitro. Pt also took 3 baby ASA at home. BP for EMS Left arm: 250/130, Right arm: 200/100. Pt rates pain 5/10.

## 2012-04-25 NOTE — ED Provider Notes (Signed)
History     CSN: [id number]  Arrival date & time 04/25/12  33   First MD Initiated Contact with Patient 04/25/12 1504      Chief Complaint  Patient presents with  . Abdominal Pain  . Back Pain  . Nausea  . Vomiting    (Consider location/radiation/quality/duration/timing/severity/associated sxs/prior treatment) Patient is a 71 y.o. female presenting with abdominal pain. The history is provided by the patient.  Abdominal Pain The primary symptoms of the illness include abdominal pain and vomiting (once). The primary symptoms of the illness do not include fever or shortness of breath. The current episode started 6 to 12 hours ago. The onset of the illness was gradual. The problem has not changed since onset. The abdominal pain began 6 to 12 hours ago. The pain came on gradually. The abdominal pain has been unchanged since its onset. The abdominal pain is located in the epigastric region. The abdominal pain radiates to the chest and back. The abdominal pain is relieved by nothing.  Symptoms associated with the illness do not include chills. Associated medical issues comments: Hx of AAA, TIA, DM, HLD, PVD.    Past Medical History  Diagnosis Date  . Peripheral vascular disease   . Diabetes mellitus   . Hyperlipidemia   . Hypertension   . TIA (transient ischemic attack)   . Arthritis     osteoarthritis  . Aneurysm   . Stroke   . AAA (abdominal aortic aneurysm)     Past Surgical History  Procedure Date  . Joint replacement     left knee  . Uterine tumor   . Ventral hernia repair   . Abdominal aortic aneurysm repair   . Abdominal hysterectomy     partial  . Tubal ligation   . Back surgery   . Abdominal aortic aneurysm repair     Family History  Problem Relation Age of Onset  . Diabetes Mother   . Hypertension Father   . Hyperlipidemia Sister   . Hypertension Sister   . Aneurysm Sister   . Hyperlipidemia Brother   . Hypertension Brother     History  Substance  Use Topics  . Smoking status: Current Every Day Smoker  . Smokeless tobacco: Not on file  . Alcohol Use: No    OB History    Grav Para Term Preterm Abortions TAB SAB Ect Mult Living                  Review of Systems  Constitutional: Negative for fever and chills.  Respiratory: Negative for cough and shortness of breath.   Gastrointestinal: Positive for vomiting (once) and abdominal pain.  All other systems reviewed and are negative.    Allergies  Codeine; Lisinopril; and Penicillins  Home Medications   Current Outpatient Rx  Name  Route  Sig  Dispense  Refill  . ASPIRIN 81 MG PO TABS   Oral   Take 81 mg by mouth daily. Sometimes takes 1 tablet three times daily if needed for chest pain         . B COMPLEX-C PO TABS   Oral   Take 1 tablet by mouth daily.         Marland Kitchen VITAMIN D 1000 UNITS PO TABS   Oral   Take 1,000 Units by mouth daily.         Marland Kitchen CINNAMON PO   Oral   Take 1 tablet by mouth daily.         Marland Kitchen  GARLIC PO   Oral   Take 1 tablet by mouth daily.         Marland Kitchen GLIMEPIRIDE 2 MG PO TABS   Oral   Take 2 mg by mouth See admin instructions. Takes 1 tablet once daily but will take 2 tablets if blood sugar is elevated.         Marland Kitchen METOPROLOL SUCCINATE ER 50 MG PO TB24   Oral   Take 50 mg by mouth daily.             BP 177/82  Pulse 63  Temp 99 F (37.2 C) (Oral)  Resp 12  SpO2 100%  Physical Exam  Nursing note and vitals reviewed. Constitutional: She is oriented to person, place, and time. She appears well-developed and well-nourished. No distress.  HENT:  Head: Normocephalic and atraumatic.  Eyes: EOM are normal. Pupils are equal, round, and reactive to light.  Neck: Normal range of motion. Neck supple.  Cardiovascular: Normal rate and regular rhythm.  Exam reveals no friction rub.   No murmur heard. Pulmonary/Chest: Effort normal and breath sounds normal. No respiratory distress. She has no wheezes. She has no rales.  Abdominal: Soft.  She exhibits no distension. There is no tenderness. There is no rebound.  Musculoskeletal: Normal range of motion. She exhibits no edema.  Neurological: She is alert and oriented to person, place, and time.  Skin: She is not diaphoretic.    ED Course  Procedures (including critical care time)  Labs Reviewed - No data to display Dg Chest 2 View  04/25/2012  *RADIOLOGY REPORT*  Clinical Data: Chest pain after eating  CHEST - 2 VIEW  Comparison: 04/05/2012  Findings: Cardiomediastinal silhouette is within normal limits. The lungs are clear. No pleural effusion.  No pneumothorax.  No acute osseous abnormality.  IMPRESSION: Normal chest.   Original Report Authenticated By: Conchita Paris, M.D.    Ct Angio Chest Aortic Dissect W &/or W/o  04/25/2012  *RADIOLOGY REPORT*  Clinical Data:  Epigastric pain with radiation to the back.  CT ANGIOGRAPHY CHEST CT ABDOMEN AND PELVIS WITH CONTRAST  Technique:  Multidetector CT imaging of the chest was performed using the standard protocol during bolus administration of intravenous contrast.  Multiplanar CT image reconstructions including MIPs were obtained to evaluate the vascular anatomy. Multidetector CT imaging of the abdomen and pelvis was performed using the standard protocol during bolus administration of intravenous contrast.  Contrast: 74mL OMNIPAQUE IOHEXOL 350 MG/ML SOLN  Comparison:  CT of the abdomen and pelvis 06/05/2008.  CTA CHEST  Findings:  Mediastinum: On precontrast images there is no crescentic high attenuation associated with the wall of the thoracic aorta to suggest acute intramural hemorrhage.  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 In the proximal aspect of the aberrant vessel there is an unusual area of aneurysmal dilatation which does not appear to represent a typical diverticulum of Kommerell.  In addition, there is a short segment dissection within this region as demonstrated on images 30 - 35 of series 6.   This aneurysmally dilated segment of the vessel measures up to 2 cm in diameter.  More distal aberrant right subclavian artery is otherwise normal in appearance (the dissection flap only extends approximately one centimeter into the vessel).  No evidence of acute dissection of the thoracic aorta itself. However, there is a 7 x 8 mm lesion associated with the posterior wall of the proximal descending thoracic aorta which likely represents some thrombus associated with the  posterior wall of the aorta.  There is a common origin of the common carotid arteries bilaterally from the arch.  No dissection flap in either of these vessels, or in the left subclavian artery.  Heart size is normal. There is no significant pericardial fluid, thickening or pericardial calcification. There is atherosclerosis of the thoracic aorta, the great vessels of the mediastinum and the coronary arteries, including calcified atherosclerotic plaque in the left anterior descending and right coronary arteries. No pathologically enlarged mediastinal or hilar lymph nodes. There is a small hiatal hernia.  Lungs/Pleura: Mild diffuse bronchial wall thickening.  Mild - moderate centrilobular and paraseptal emphysema.  No acute consolidative air space disease.  No pleural effusions.  No definite suspicious appearing pulmonary nodules or masses are identified.  Musculoskeletal: There are no aggressive appearing lytic or blastic lesions noted in the visualized portions of the skeleton.   Review of the MIP images confirms the above findings.  IMPRESSION: 1.  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 2.  No evidence of thoracic aortic dissection.  However, there is a prominent exophytic mural thrombus extending into the lumen of the proximal descending thoracic aorta, associated with the posterior aspect of the wall.  This likely puts the patient at increased  risk for distal embolic events. 3. Atherosclerosis, including left anterior descending and right coronary artery disease. 4. Mild diffuse bronchial wall thickening with mild - moderate centrilobular and paraseptal emphysema; imaging findings compatible with underlying COPD. 5.  Small hiatal hernia.  CT ABDOMEN AND PELVIS  Findings:  Abdomen/Pelvis:  The appearance of the liver, gallbladder, spleen and bilateral kidneys is unremarkable.  Mild adreniform thickening is noted in the adrenal glands bilaterally.  In the head of the pancreas there is a 1.5 x 1.1 cm hypervascular lesion (image 167 of series 6).  There are remainder of the pancreas is otherwise normal in appearance, although the duct is borderline dilated (4 mm). Normal appendix.  No ascites or pneumoperitoneum and no pathologic distension of small bowel.  No definite pathologic lymphadenopathy identified in the abdomen or pelvis on today's examination.  There is a small ventral hernia in the epigastric region containing a short segment of mid small bowel, without evidence of incarceration or obstruction. There is a wide-mouth ventral hernia beneath this, which a portion of the mid transverse colon protrudes into. Several other smaller ventral hernias are noted inferior to this, one of which contains a large amount of omental fat.  Numerous colonic diverticula are noted, without surrounding inflammatory changes to suggest acute diverticulitis at this time. Status post hysterectomy.  Ovaries are not confidently identified, and may either be surgically absent or atrophic. The urinary bladder is unremarkable in appearance.  Extensive atherosclerosis of the abdominal and pelvic vasculature. Distal abdominal aortic graft is noted which begins below the level of the renal arteries and terminates immediately above the aortic bifurcation.  There appears to be a hemodynamically significant stenosis at the ostium of the right common iliac artery.  There is also likely  a hemodynamically significant stenosis at the ostium of the left renal artery.  Right renal artery, celiac axis and superior mesenteric arteries are all grossly patent.  The ostium and proximal portion of the inferior mesenteric artery is not patent, however, the distal aspect of the vessel does fill with contrast, presumably secondary to collateral flow.  Musculoskeletal: There are no aggressive appearing lytic or blastic lesions noted in  the visualized portions of the skeleton. Multilevel degenerative disc disease, most notable at L4-L5 where there is 4 mm of anterolisthesis of L4 upon L5.  Review of the MIP images confirms the above findings.  IMPRESSION: 1.  No acute findings in the abdomen or pelvis to account for the patient's symptoms. 2.  Extensive atherosclerosis including probable hemodynamically significant stenosis at the ostium of the left renal artery and right common iliac arteries.  Clinical correlation for signs and symptoms of renovascular hypertension may be warranted. 3.  1.5 x 1.1 cm hypervascular lesion in the head of the pancreas is similar in retrospect compared to remote prior studies dating back to January 04, 2004, and is therefore favored to be benign. Differential considerations would favor a small benign neuroendocrine tumor such as an insulinoma. 4.  Multiple small ventral hernias, most notably there is one containing a short segment of small bowel.  At this time, there is no evidence of bowel incarceration or obstruction. 5.  Multilevel degenerative disc disease, as above, including 4 mm of anterolisthesis of L4 upon L5. 6.  Mild bilateral adreniform thickening, likely reflecting adrenal hyperplasia.   Original Report Authenticated By: Vinnie Langton, M.D.    Ct Angio Abd/pel W/ And/or W/o  04/25/2012  *RADIOLOGY REPORT*  Clinical Data:  Epigastric pain with radiation to the back.  CT ANGIOGRAPHY CHEST CT ABDOMEN AND PELVIS WITH CONTRAST  Technique:  Multidetector CT imaging of the  chest was performed using the standard protocol during bolus administration of intravenous contrast.  Multiplanar CT image reconstructions including MIPs were obtained to evaluate the vascular anatomy. Multidetector CT imaging of the abdomen and pelvis was performed using the standard protocol during bolus administration of intravenous contrast.  Contrast: 60mL OMNIPAQUE IOHEXOL 350 MG/ML SOLN  Comparison:  CT of the abdomen and pelvis 06/05/2008.  CTA CHEST  Findings:  Mediastinum: On precontrast images there is no crescentic high attenuation associated with the wall of the thoracic aorta to suggest acute intramural hemorrhage.  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 In the proximal aspect of the aberrant vessel there is an unusual area of aneurysmal dilatation which does not appear to represent a typical diverticulum of Kommerell.  In addition, there is a short segment dissection within this region as demonstrated on images 30 - 35 of series 6.  This aneurysmally dilated segment of the vessel measures up to 2 cm in diameter.  More distal aberrant right subclavian artery is otherwise normal in appearance (the dissection flap only extends approximately one centimeter into the vessel).  No evidence of acute dissection of the thoracic aorta itself. However, there is a 7 x 8 mm lesion associated with the posterior wall of the proximal descending thoracic aorta which likely represents some thrombus associated with the posterior wall of the aorta.  There is a common origin of the common carotid arteries bilaterally from the arch.  No dissection flap in either of these vessels, or in the left subclavian artery.  Heart size is normal. There is no significant pericardial fluid, thickening or pericardial calcification. There is atherosclerosis of the thoracic aorta, the great vessels of the mediastinum and the coronary arteries, including calcified atherosclerotic plaque in the left anterior  descending and right coronary arteries. No pathologically enlarged mediastinal or hilar lymph nodes. There is a small hiatal hernia.  Lungs/Pleura: Mild diffuse bronchial wall thickening.  Mild - moderate centrilobular and paraseptal emphysema.  No acute consolidative air space disease.  No pleural effusions.  No definite suspicious appearing pulmonary nodules or masses are identified.  Musculoskeletal: There are no aggressive appearing lytic or blastic lesions noted in the visualized portions of the skeleton.   Review of the MIP images confirms the above findings.  IMPRESSION: 1.  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 2.  No evidence of thoracic aortic dissection.  However, there is a prominent exophytic mural thrombus extending into the lumen of the proximal descending thoracic aorta, associated with the posterior aspect of the wall.  This likely puts the patient at increased risk for distal embolic events. 3. Atherosclerosis, including left anterior descending and right coronary artery disease. 4. Mild diffuse bronchial wall thickening with mild - moderate centrilobular and paraseptal emphysema; imaging findings compatible with underlying COPD. 5.  Small hiatal hernia.  CT ABDOMEN AND PELVIS  Findings:  Abdomen/Pelvis:  The appearance of the liver, gallbladder, spleen and bilateral kidneys is unremarkable.  Mild adreniform thickening is noted in the adrenal glands bilaterally.  In the head of the pancreas there is a 1.5 x 1.1 cm hypervascular lesion (image 167 of series 6).  There are remainder of the pancreas is otherwise normal in appearance, although the duct is borderline dilated (4 mm). Normal appendix.  No ascites or pneumoperitoneum and no pathologic distension of small bowel.  No definite pathologic lymphadenopathy identified in the abdomen or pelvis on today's examination.  There is a small ventral hernia  in the epigastric region containing a short segment of mid small bowel, without evidence of incarceration or obstruction. There is a wide-mouth ventral hernia beneath this, which a portion of the mid transverse colon protrudes into. Several other smaller ventral hernias are noted inferior to this, one of which contains a large amount of omental fat.  Numerous colonic diverticula are noted, without surrounding inflammatory changes to suggest acute diverticulitis at this time. Status post hysterectomy.  Ovaries are not confidently identified, and may either be surgically absent or atrophic. The urinary bladder is unremarkable in appearance.  Extensive atherosclerosis of the abdominal and pelvic vasculature. Distal abdominal aortic graft is noted which begins below the level of the renal arteries and terminates immediately above the aortic bifurcation.  There appears to be a hemodynamically significant stenosis at the ostium of the right common iliac artery.  There is also likely a hemodynamically significant stenosis at the ostium of the left renal artery.  Right renal artery, celiac axis and superior mesenteric arteries are all grossly patent.  The ostium and proximal portion of the inferior mesenteric artery is not patent, however, the distal aspect of the vessel does fill with contrast, presumably secondary to collateral flow.  Musculoskeletal: There are no aggressive appearing lytic or blastic lesions noted in the visualized portions of the skeleton. Multilevel degenerative disc disease, most notable at L4-L5 where there is 4 mm of anterolisthesis of L4 upon L5.  Review of the MIP images confirms the above findings.  IMPRESSION: 1.  No acute findings in the abdomen or pelvis to account for the patient's symptoms. 2.  Extensive atherosclerosis including probable hemodynamically significant stenosis at the ostium of the left renal artery and right common iliac arteries.  Clinical correlation for signs and symptoms  of renovascular hypertension may be warranted. 3.  1.5 x 1.1 cm hypervascular lesion in the head of the pancreas is similar in retrospect compared to remote prior studies dating back to January 04, 2004, and is therefore favored  to be benign. Differential considerations would favor a small benign neuroendocrine tumor such as an insulinoma. 4.  Multiple small ventral hernias, most notably there is one containing a short segment of small bowel.  At this time, there is no evidence of bowel incarceration or obstruction. 5.  Multilevel degenerative disc disease, as above, including 4 mm of anterolisthesis of L4 upon L5. 6.  Mild bilateral adreniform thickening, likely reflecting adrenal hyperplasia.   Original Report Authenticated By: Vinnie Langton, M.D.      1. Chest pain   2. Artery dissection   3. Epigastric burning sensation      Date: 04/25/2012  Rate: 67  Rhythm: normal sinus rhythm  QRS Axis: normal  Intervals: normal  ST/T Wave abnormalities: normal  Conduction Disutrbances:none  Narrative Interpretation:   Old EKG Reviewed: unchanged    MDM   35F with hx of AAA, PVD, DM, HLD, HTN, TIA presents with epigastric pain. Began earlier after eating. Radiated through to her back and to her L chest. Vomiting x 1. Had bilateral systolic pressures of [id number] and 200, however here 185 and 198. Patient well-appearing, relaxing here. Still having some mild pain. Good pulses in all extremities, normal sinus rhythm on the monitor, no hypoxia with good waveform.  No numbness/tingling in her extremities. EKG with normal sinus rhythm, no concern for acute ischemia. Concern for possible aortic dissection vs ACS. Will send troponins and order CT angio of abdomen/pelvis. Initial troponin negative. Patient's CT and showed the chest abdomen and pelvis shows no thoracic dissection. It does show a small aneurysm and dissection which is focal in the right proximal subclavian artery. She also has an exophytic mural  thrombus in the thoracic aorta. I contacted CT surgery, Dr. 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 CT surgery said this is unlikely the cause of her pain, however she will need followup for this soon. CT surgery evaluate patient emergency department. He also recommended I called vascular for this, seeing as how she is a known vascular patient. Vascular surgery, Dr. Kellie Simmering, also agreed that there is nothing that could be done acutely for this. He said that this is unlikely the source for her pain. Both surgeons agree that she should be admitted. On reexam, patient states she's feeling much better and that her pain is resolved. I explained her the findings and what the surgeons recommended. She states she does not feel that this is her heart and that she would like to leave. She understands the risks that coming leaving without admission. Patient states she should avoid coming in,and she felt like this was all GI related anyway. Patient is alert and oriented and of sound mind. She is not psychotic in any way. She did agree to waiting for a delta troponin, which was negative. I spoke with Dr. Doylene Canard, who is on-call for her primary doctor. He states that she can call him in the morning and he will work her in tomorrow morning. I gave patient his number, she states that she will call him. He recommended increasing her Toprol to twice a day instead of once a day for better control of her blood pressure. Patient understands this. Patient discharged home in stable condition.         Evelina Bucy, MD 04/26/12 0002

## 2012-04-25 NOTE — ED Notes (Signed)
Patient left AMA after speaking with resident and EDP; patient signed electronic AMA form.  Patient aware of risks of leaving AMA, including possible death.

## 2012-04-25 NOTE — ED Notes (Addendum)
Pt presents to department for evaluation of epigastric pain. Onset 10:30am. States pain radiates directly to back and up to chest. Describes as "pressure" sensation. 3/10 pain at the time. Able to move all extremities. Lung sounds clear and equal bilaterally. She is alert and oriented x4.

## 2012-04-26 NOTE — ED Provider Notes (Signed)
I saw and evaluated the patient, reviewed the resident's note and I agree with the findings and plan.  Pt came in with cc of chest pain radiating to the back and some left sided arm numbness. She was noted to be hypertensive, and has hx of aneurysm. 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 CT surgery and Vascular surgery called- requested admission with f/u while inpatient. Patient however requests to leave. Dr. Zenia Resides, PCP team informed, of urgent followup. BP improved in the ER.  CRITICAL CARE Performed by: Varney Biles   Total critical care time: 35 minutes  Critical care time was exclusive of separately billable procedures and treating other patients.  Critical care was necessary to treat or prevent imminent or life-threatening deterioration.  Critical care was time spent personally by me on the following activities: development of treatment plan with patient and/or surrogate as well as nursing, discussions with consultants, evaluation of patient's response to treatment, examination of patient, obtaining history from patient or surrogate, ordering and performing treatments and interventions, ordering and review of laboratory studies, ordering and review of radiographic studies, pulse oximetry and re-evaluation of patient's condition.     Varney Biles, MD 04/26/12 [id number]

## 2012-04-30 ENCOUNTER — Telehealth: Payer: Self-pay | Admitting: Vascular Surgery

## 2012-04-30 NOTE — Telephone Encounter (Addendum)
Message copied by Lujean Amel on Tue Apr 30, 2012 10:34 AM ------      Message from: Ruta Hinds E      Created: Mon Apr 29, 2012  5:21 PM      Regarding: RE: appt scheduling       She needs blood pressure taken in both arms with her vital signs and bilateral carotid duplex for planning purposes possible carotid subclavian bypass rule out hemodynamic significant carotid stenosis.            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 She was recently hospitalized and Dr.Owen did perform surgery on her. I will schedule an appointment for you to see her in March but I was unsure if she would need any vascular labs or CTA prior.       Thanks for your help! Anne Ng      I scheduled an appt for the above patient on 08/01/12 at 11:30am for a carotid doppler and to see CEF at 12:30pm. I wasn't able to reach the patient by phone so I mailed an appt letter to her. awt

## 2012-05-14 ENCOUNTER — Telehealth: Payer: Self-pay | Admitting: Vascular Surgery

## 2012-05-14 NOTE — Telephone Encounter (Signed)
At [facility] request, I spoke with this patient yesterday regarding her appt on 08/01/12. She had not received the appt letter I mailed so I verified her address and mailed another letter. She did not feel as though this appt was necessary and stated "her carotid arteries were fine". I encouraged her to keep this appt and also reassured her we needed to see her per CEF's message. awt

## 2012-07-08 ENCOUNTER — Other Ambulatory Visit: Payer: Self-pay

## 2012-07-08 DIAGNOSIS — I739 Peripheral vascular disease, unspecified: Secondary | ICD-10-CM

## 2012-07-08 DIAGNOSIS — I712 Thoracic aortic aneurysm, without rupture, unspecified: Secondary | ICD-10-CM

## 2012-07-25 ENCOUNTER — Other Ambulatory Visit: Payer: Self-pay | Admitting: *Deleted

## 2012-07-25 DIAGNOSIS — G459 Transient cerebral ischemic attack, unspecified: Secondary | ICD-10-CM

## 2012-07-31 ENCOUNTER — Encounter: Payer: Self-pay | Admitting: Vascular Surgery

## 2012-08-01 ENCOUNTER — Other Ambulatory Visit (INDEPENDENT_AMBULATORY_CARE_PROVIDER_SITE_OTHER): Payer: Medicare Other | Admitting: *Deleted

## 2012-08-01 ENCOUNTER — Encounter (INDEPENDENT_AMBULATORY_CARE_PROVIDER_SITE_OTHER): Payer: Medicare Other | Admitting: *Deleted

## 2012-08-01 ENCOUNTER — Encounter: Payer: Self-pay | Admitting: Vascular Surgery

## 2012-08-01 ENCOUNTER — Ambulatory Visit (INDEPENDENT_AMBULATORY_CARE_PROVIDER_SITE_OTHER): Payer: Medicare Other | Admitting: Vascular Surgery

## 2012-08-01 VITALS — BP 181/81 | HR 61 | Ht 67.5 in | Wt 180.2 lb

## 2012-08-01 DIAGNOSIS — G459 Transient cerebral ischemic attack, unspecified: Secondary | ICD-10-CM

## 2012-08-01 DIAGNOSIS — I70219 Atherosclerosis of native arteries of extremities with intermittent claudication, unspecified extremity: Secondary | ICD-10-CM

## 2012-08-01 DIAGNOSIS — I7100 Dissection of unspecified site of aorta: Secondary | ICD-10-CM

## 2012-08-01 DIAGNOSIS — I7101 Dissection of thoracic aorta: Secondary | ICD-10-CM

## 2012-08-01 DIAGNOSIS — I728 Aneurysm of other specified arteries: Secondary | ICD-10-CM

## 2012-08-01 DIAGNOSIS — I71019 Dissection of thoracic aorta, unspecified: Secondary | ICD-10-CM

## 2012-08-01 DIAGNOSIS — I739 Peripheral vascular disease, unspecified: Secondary | ICD-10-CM

## 2012-08-01 NOTE — Progress Notes (Signed)
[facility] [place] HISTORY AND PHYSICAL   History of Present Illness:  Patient is a 72 y.o. year old female who presents for follow-up evaluation for carotid stenosis.  She is on Aspirin for antiplatelet therapy.  His atherosclerotic risk factors remain diabetes, elevated cholesterol, hypertension.  These are all currently stable except for her hypertension. Her primary care physician is currently adjusting her medications.  She denies any new neurologic events including amaurosis, numbness, or weakness. 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 She did not return for followup until today. She was noted to have some aneurysmal degeneration of the artery slightly greater than 2 cm. She denies any chest pain. She denies any numbness weakness or tingling in her right arm.  She previously had abdominal aortic aneurysm repair with a tube graft by Dr. Amedeo Plenty in 2007. We are also following her for lower extremity peripheral arterial disease. She states that the claudication symptoms in her lower extremities have improved with walking.  Past Medical History  Diagnosis Date  . Peripheral vascular disease   . Diabetes mellitus   . Hyperlipidemia   . Hypertension   . TIA (transient ischemic attack)   . Arthritis     osteoarthritis  . Aneurysm   . Stroke   . AAA (abdominal aortic aneurysm)     Past Surgical History  Procedure Laterality Date  . Joint replacement      left knee  . Uterine tumor    . Ventral hernia repair    . Abdominal aortic aneurysm repair    . Abdominal hysterectomy      partial  . Tubal ligation    . Back surgery    . Abdominal aortic aneurysm repair      Review of Systems:  Neurologic: as above Cardiac:denies shortness of breath or chest pain Pulmonary: denies cough or wheeze  Social History History  Substance Use Topics  . Smoking status: Current Every Day Smoker -- 1.00 packs/day    Types:  Cigarettes  . Smokeless tobacco: Never Used  . Alcohol Use: No    Allergies  Allergies  Allergen Reactions  . Codeine Other (See Comments)    Knocked her out of equilibrium. Made her world flip upside down  . Lisinopril Cough  . Penicillins     Makes exzcema worse     Current Outpatient Prescriptions  Medication Sig Dispense Refill  . aspirin 81 MG tablet Take 81 mg by mouth daily. Sometimes takes 1 tablet three times daily if needed for chest pain      . B Complex-C (B-COMPLEX WITH VITAMIN C) tablet Take 1 tablet by mouth daily.      . cholecalciferol (VITAMIN D) 1000 UNITS tablet Take 1,000 Units by mouth daily.      Marland Kitchen CINNAMON PO Take 1 tablet by mouth daily.      Marland Kitchen GARLIC PO Take 1 tablet by mouth daily.      Marland Kitchen glimepiride (AMARYL) 2 MG tablet Take 2 mg by mouth See admin instructions. Takes 1 tablet once daily but will take 2 tablets if blood sugar is elevated.      . metoprolol (TOPROL-XL) 50 MG 24 hr tablet Take 50 mg by mouth daily.         No current facility-administered medications for this visit.    Physical Examination  Filed Vitals:   08/01/12 1219 08/01/12 1223  BP: 184/89 181/81  Pulse: 61  Height: 5' 7.5" (1.715 m)   Weight: 180 lb 3.2 oz (81.738 kg)   SpO2: 100%     Body mass index is 27.79 kg/(m^2).  General:  Alert and oriented, no acute distress HEENT: Normal Neck: No bruit or JVD Pulmonary: Clear to auscultation bilaterally Cardiac: Regular Rate and Rhythm without murmur Neurologic: Upper and lower extremity motor 5/5 and symmetric  DATA: Carotid duplex exam today shows less than 40% internal carotid artery stenosis bilaterally. This is unchanged since 2007. I reviewed and interpreted this study. She also had bilateral ABIs. ABI on the right was 0.56 left was 0.62 these are fairly similar to May of 2013. I also reviewed her CT scan from November. This shows the area of dissection of her aberrent right subclavian her abdominal aorta is patent  with no recurrent aneurysm she did have evidence of left renal artery stenosis.   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 However she is refusing any interventions at this time. She prefers continued observation. We will obtain a repeat CT Angio of her chest in 6 months time. #2 peripheral arterial disease stable continue to walk risk factor modification repeat ABIs in 6 months. #3 hypertension certainly a component of benign essential hypertension. However she does have evidence of left renal artery stenosis. The patient is currently refusing an intervention for this. However if her blood pressure is difficult to control over time or she has deterioration of renal function consideration could be given for a left renal angiogram possible angioplasty and stenting. I will leave this at the discretion of her primary care physician whether or not she wishes to pursue this further.  Ruta Hinds, MD [facility] [place] Office: [phone] Pager: [phone]

## 2012-08-05 NOTE — Addendum Note (Signed)
Addended by: Mena Goes on: 08/05/2012 01:18 PM   Modules accepted: Orders

## 2012-08-12 ENCOUNTER — Ambulatory Visit: Payer: PRIVATE HEALTH INSURANCE | Admitting: Thoracic Surgery (Cardiothoracic Vascular Surgery)

## 2012-08-12 ENCOUNTER — Other Ambulatory Visit: Payer: PRIVATE HEALTH INSURANCE

## 2012-10-01 ENCOUNTER — Emergency Department (HOSPITAL_COMMUNITY): Payer: Medicare Other

## 2012-10-01 ENCOUNTER — Emergency Department (HOSPITAL_COMMUNITY)
Admission: EM | Admit: 2012-10-01 | Discharge: 2012-10-01 | Discharge status: Against Medical Advice | Payer: Medicare Other | Attending: Emergency Medicine | Admitting: Emergency Medicine

## 2012-10-01 ENCOUNTER — Encounter (HOSPITAL_COMMUNITY): Payer: Self-pay | Admitting: Adult Health

## 2012-10-01 ENCOUNTER — Encounter (HOSPITAL_COMMUNITY): Payer: Self-pay | Admitting: Emergency Medicine

## 2012-10-01 DIAGNOSIS — R0789 Other chest pain: Secondary | ICD-10-CM | POA: Insufficient documentation

## 2012-10-01 DIAGNOSIS — Z880 Allergy status to penicillin: Secondary | ICD-10-CM | POA: Insufficient documentation

## 2012-10-01 DIAGNOSIS — Z79899 Other long term (current) drug therapy: Secondary | ICD-10-CM | POA: Insufficient documentation

## 2012-10-01 DIAGNOSIS — E785 Hyperlipidemia, unspecified: Secondary | ICD-10-CM | POA: Insufficient documentation

## 2012-10-01 DIAGNOSIS — Z8673 Personal history of transient ischemic attack (TIA), and cerebral infarction without residual deficits: Secondary | ICD-10-CM | POA: Insufficient documentation

## 2012-10-01 DIAGNOSIS — F17200 Nicotine dependence, unspecified, uncomplicated: Secondary | ICD-10-CM | POA: Insufficient documentation

## 2012-10-01 DIAGNOSIS — R1013 Epigastric pain: Secondary | ICD-10-CM | POA: Insufficient documentation

## 2012-10-01 DIAGNOSIS — I509 Heart failure, unspecified: Secondary | ICD-10-CM | POA: Insufficient documentation

## 2012-10-01 DIAGNOSIS — M79609 Pain in unspecified limb: Secondary | ICD-10-CM | POA: Insufficient documentation

## 2012-10-01 DIAGNOSIS — Z8679 Personal history of other diseases of the circulatory system: Secondary | ICD-10-CM | POA: Insufficient documentation

## 2012-10-01 DIAGNOSIS — I10 Essential (primary) hypertension: Secondary | ICD-10-CM | POA: Insufficient documentation

## 2012-10-01 DIAGNOSIS — Z8639 Personal history of other endocrine, nutritional and metabolic disease: Secondary | ICD-10-CM | POA: Insufficient documentation

## 2012-10-01 DIAGNOSIS — I824Z9 Acute embolism and thrombosis of unspecified deep veins of unspecified distal lower extremity: Secondary | ICD-10-CM | POA: Insufficient documentation

## 2012-10-01 DIAGNOSIS — Z8739 Personal history of other diseases of the musculoskeletal system and connective tissue: Secondary | ICD-10-CM | POA: Insufficient documentation

## 2012-10-01 DIAGNOSIS — R0602 Shortness of breath: Secondary | ICD-10-CM | POA: Insufficient documentation

## 2012-10-01 DIAGNOSIS — Z862 Personal history of diseases of the blood and blood-forming organs and certain disorders involving the immune mechanism: Secondary | ICD-10-CM | POA: Insufficient documentation

## 2012-10-01 DIAGNOSIS — Z7982 Long term (current) use of aspirin: Secondary | ICD-10-CM | POA: Insufficient documentation

## 2012-10-01 DIAGNOSIS — E119 Type 2 diabetes mellitus without complications: Secondary | ICD-10-CM | POA: Insufficient documentation

## 2012-10-01 DIAGNOSIS — M129 Arthropathy, unspecified: Secondary | ICD-10-CM | POA: Insufficient documentation

## 2012-10-01 DIAGNOSIS — Z90710 Acquired absence of both cervix and uterus: Secondary | ICD-10-CM | POA: Insufficient documentation

## 2012-10-01 DIAGNOSIS — R079 Chest pain, unspecified: Secondary | ICD-10-CM

## 2012-10-01 DIAGNOSIS — Z9851 Tubal ligation status: Secondary | ICD-10-CM | POA: Insufficient documentation

## 2012-10-01 DIAGNOSIS — I82409 Acute embolism and thrombosis of unspecified deep veins of unspecified lower extremity: Secondary | ICD-10-CM | POA: Insufficient documentation

## 2012-10-01 DIAGNOSIS — R011 Cardiac murmur, unspecified: Secondary | ICD-10-CM | POA: Insufficient documentation

## 2012-10-01 DIAGNOSIS — I82401 Acute embolism and thrombosis of unspecified deep veins of right lower extremity: Secondary | ICD-10-CM

## 2012-10-01 LAB — BASIC METABOLIC PANEL
BUN: 12 mg/dL (ref 6–23)
CO2: 22 mEq/L (ref 19–32)
Chloride: 98 mEq/L (ref 96–112)
Creatinine, Ser: 0.83 mg/dL (ref 0.50–1.10)

## 2012-10-01 LAB — CBC
HCT: 37.9 % (ref 36.0–46.0)
MCV: 84.6 fL (ref 78.0–100.0)
RBC: 4.48 MIL/uL (ref 3.87–5.11)
RDW: 13.7 % (ref 11.5–15.5)
WBC: 7.2 10*3/uL (ref 4.0–10.5)

## 2012-10-01 MED ORDER — IOHEXOL 350 MG/ML SOLN
100.0000 mL | Freq: Once | INTRAVENOUS | Status: AC | PRN
  Administered 2012-10-01: 100 mL via INTRAVENOUS

## 2012-10-01 MED ORDER — ENOXAPARIN SODIUM 80 MG/0.8ML ~~LOC~~ SOLN
80.0000 mg | Freq: Once | SUBCUTANEOUS | Status: AC
  Administered 2012-10-01: 80 mg via SUBCUTANEOUS
  Filled 2012-10-01: qty 0.8

## 2012-10-01 MED ORDER — ENOXAPARIN SODIUM 150 MG/ML ~~LOC~~ SOLN: 1.0000 mg/kg | Freq: Once | SUBCUTANEOUS | Status: DC

## 2012-10-01 NOTE — ED Notes (Signed)
Pt c/o chest pressure x 3 days with SOB and HA with pain down right arm x 3 days

## 2012-10-01 NOTE — ED Notes (Signed)
No changes at the present resp symmetrical and unlabored.

## 2012-10-01 NOTE — ED Notes (Signed)
Patient transported to CT 

## 2012-10-01 NOTE — ED Notes (Signed)
Patient given discharge instructions for DVT. Advised to follow up with dr Stephanie Acre tomorrow. Patient voiced understanding of AMA. Patient ambulated to front lobby without difficulty.

## 2012-10-01 NOTE — ED Provider Notes (Signed)
Plan at signout to f/u on US imaging No AAA noted Pt refuses to be admitted and she accepts risk of leaving (death/disability) I spoke to vascular about CT findings and if she is safe for anticoagulation for her DVT Dr fields feels that with unchanged focal subclavian aneurysm/dissection she is safe for lovenox as it is not acute Pt agrees to see her PCP Tomorrow for her DVT and further dosing of meds One dose of lovenox given here to cover for overnight and her PCP can prescribe the rest of her regimen She denies any recent GI bleed I was not able to get a hold of her PCP before discharge  I discussed risk of death/disability of leaving against medical advice and the patient accepts these risks.  The patient is awake/alet able to make decisions, and not intoxicated Patient discharged against medical advice.    Sharyon Cable, MD 10/01/12 [phone]

## 2012-10-01 NOTE — ED Notes (Signed)
Dr. Nanavati at the bedside.  

## 2012-10-01 NOTE — ED Notes (Signed)
ASSISTANT DIRECTOR OF NURSING EXPLAINED TO PT. AND FAMILY THE PROCESS .

## 2012-10-01 NOTE — ED Notes (Signed)
Pt returned from leaving AMA due to family request. Decided she will be admitted.

## 2012-10-01 NOTE — ED Notes (Signed)
Pt c/o increased chest pain

## 2012-10-01 NOTE — Progress Notes (Signed)
*  Preliminary Results* Bilateral lower extremity venous duplex completed. The right lower extremity is positive for deep vein thrombosis involving the right posterior tibial veins. There is no obvious evidence of left lower extremity deep vein thrombosis. No evidence of Baker's cyst bilaterally. Preliminary results discussed with Dr.Nanavati.  10/01/2012 5:23 PM Maudry Mayhew, RDMS, RDCS

## 2012-10-01 NOTE — ED Provider Notes (Signed)
History     CSN: [id number]  Arrival date & time 10/01/12  [id number]   First MD Initiated Contact with Patient 10/01/12 1111      Chief Complaint  Patient presents with  . Chest Pain  . Shortness of Breath    (Consider location/radiation/quality/duration/timing/severity/associated sxs/prior treatment) HPI Comments: Pt comes in with cc of chest pain, leg pain, epigastric abd pain. Pt has hx of AAA repair, DM, HL, CHF, and a CT in feb that noted thoracic aneurysm, possible dissection - being followed by Dr. Oneida Alar. Pt states that she has been having some leg pain - bilateral x few days. PT also has chest pain - left sided, constant x 2 days, with no specific aggravating or relieving factors. No n/v/f/c. Pt has epigastric abd pain - not new, no radiation. No syncope, no numbness, tingling, weakness.  Patient is a 72 y.o. female presenting with chest pain and shortness of breath. The history is provided by the patient.  Chest Pain Associated symptoms: abdominal pain and shortness of breath   Associated symptoms: no cough, no nausea and not vomiting   Shortness of Breath Associated symptoms: abdominal pain and chest pain   Associated symptoms: no cough, no neck pain, no vomiting and no wheezing     Past Medical History  Diagnosis Date  . Peripheral vascular disease   . Diabetes mellitus   . Hyperlipidemia   . Hypertension   . TIA (transient ischemic attack)   . Arthritis     osteoarthritis  . Aneurysm   . Stroke   . AAA (abdominal aortic aneurysm)     Past Surgical History  Procedure Laterality Date  . Joint replacement      left knee  . Uterine tumor    . Ventral hernia repair    . Abdominal aortic aneurysm repair    . Abdominal hysterectomy      partial  . Tubal ligation    . Back surgery    . Abdominal aortic aneurysm repair      Family History  Problem Relation Age of Onset  . Diabetes Mother   . Hypertension Father   . Hyperlipidemia Sister   . Hypertension  Sister   . Aneurysm Sister   . Hyperlipidemia Brother   . Hypertension Brother     History  Substance Use Topics  . Smoking status: Current Every Day Smoker -- 1.00 packs/day    Types: Cigarettes  . Smokeless tobacco: Never Used  . Alcohol Use: No    OB History   Grav Para Term Preterm Abortions TAB SAB Ect Mult Living                  Review of Systems  Constitutional: Negative for activity change.  HENT: Negative for facial swelling and neck pain.   Respiratory: Positive for shortness of breath. Negative for cough and wheezing.   Cardiovascular: Positive for chest pain.  Gastrointestinal: Positive for abdominal pain. Negative for nausea, vomiting, diarrhea, constipation, blood in stool and abdominal distention.  Genitourinary: Negative for hematuria and difficulty urinating.  Musculoskeletal: Positive for myalgias and arthralgias.  Skin: Negative for color change.  Neurological: Negative for speech difficulty.  Hematological: Does not bruise/bleed easily.  Psychiatric/Behavioral: Negative for confusion.    Allergies  Codeine; Lisinopril; Penicillins; and Azor  Home Medications   Current Outpatient Rx  Name  Route  Sig  Dispense  Refill  . APPLE CIDER VINEGAR PO   Oral   Take 15 mLs by mouth  daily.         . aspirin 81 MG tablet   Oral   Take 81 mg by mouth daily. Sometimes takes 1 tablet three times daily if needed for chest pain         . B Complex-C (B-COMPLEX WITH VITAMIN C) tablet   Oral   Take 1 tablet by mouth daily.         . Cholecalciferol (VITAMIN D PO)   Oral   Take 1 tablet by mouth daily.         . cholecalciferol (VITAMIN D) 1000 UNITS tablet   Oral   Take 1,000 Units by mouth daily.         Marland Kitchen CINNAMON PO   Oral   Take 1 tablet by mouth daily.         Marland Kitchen esomeprazole (NEXIUM) 40 MG capsule   Oral   Take 40 mg by mouth daily as needed (acid reflux).         Marland Kitchen GARLIC PO   Oral   Take 1 tablet by mouth daily.          Marland Kitchen glimepiride (AMARYL) 2 MG tablet   Oral   Take 2 mg by mouth daily. Takes 1 tablet once daily but will take 2 tablets if blood sugar is elevated.         . metoprolol (TOPROL-XL) 50 MG 24 hr tablet   Oral   Take 50 mg by mouth daily.           . nitroGLYCERIN (NITROSTAT) 0.4 MG SL tablet   Sublingual   Place 0.4 mg under the tongue every 5 (five) minutes as needed for chest pain.         . vitamin E 400 UNIT capsule   Oral   Take 400 Units by mouth daily.         Marland Kitchen ibuprofen (ADVIL,MOTRIN) 800 MG tablet   Oral   Take 800 mg by mouth daily as needed for pain.           BP 154/56  Pulse 65  Temp(Src) 98.2 F (36.8 C) (Oral)  Resp 16  SpO2 97%  Physical Exam  Nursing note and vitals reviewed. Constitutional: She is oriented to person, place, and time. She appears well-developed and well-nourished.  HENT:  Head: Normocephalic and atraumatic.  Eyes: EOM are normal. Pupils are equal, round, and reactive to light.  Neck: Neck supple.  Cardiovascular: Normal rate and regular rhythm.   Murmur heard. Pulmonary/Chest: Effort normal. No respiratory distress. She has no wheezes.  Abdominal: Soft. She exhibits no distension. There is tenderness. There is no rebound and no guarding.  epigastric  Neurological: She is alert and oriented to person, place, and time.  Skin: Skin is warm and dry.    ED Course  Procedures (including critical care time)  Labs Reviewed  BASIC METABOLIC PANEL - Abnormal; Notable for the following:    Sodium 133 (*)    Glucose, Bld 335 (*)    GFR calc non Af Amer 68 (*)    GFR calc Af Amer 79 (*)    All other components within normal limits  CBC  PRO B NATRIURETIC PEPTIDE  POCT I-STAT TROPONIN I   Dg Chest 2 View  10/01/2012  *RADIOLOGY REPORT*  Clinical Data: Chest pain, shortness of breath  CHEST - 2 VIEW  Comparison: 04/25/2012  Findings: Cardiomediastinal silhouette is stable.  No acute infiltrate or pleural effusion.  No pulmonary  edema.  Bony thorax is unremarkable.  IMPRESSION: No active disease.  No significant change.   Original Report Authenticated By: Lahoma Crocker, M.D.    Ct Angio Chest Aorta W/cm &/or Wo/cm  10/01/2012  *RADIOLOGY REPORT*  Clinical Data: 72 year old female with chest pain and shortness of breath.  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  CT ANGIOGRAPHY CHEST  Technique:  Multidetector CT imaging of the chest using the standard protocol during bolus administration of intravenous contrast. Multiplanar reconstructed images including MIPs were obtained and reviewed to evaluate the vascular anatomy.  Contrast: 110mL OMNIPAQUE IOHEXOL 350 MG/ML SOLN  Comparison: 04/25/2012 CT  Findings: Mild cardiomegaly is again noted. 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 There is no evidence of thoracic aortic aneurysm or dissection. No large or central pulmonary emboli are present.  There are no pleural or pericardial effusions. No enlarged or abnormal-appearing lymph nodes are identified.  Mild paraseptal and centrilobular emphysema is identified. No definite airspace disease, consolidation, nodule, mass or endobronchial/endotracheal lesion.  The visualized upper abdomen is unremarkable. No acute or suspicious bony abnormalities are identified.  IMPRESSION: No evidence of acute abnormality - no evidence of thoracic aortic aneurysm/dissection or pulmonary emboli.  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  Mild cardiomegaly and mild paraseptal/centrilobular emphysema.   Original Report Authenticated By: Margarette Canada, M.D.      1. DVT (deep venous thrombosis), right   2. Chest pain       MDM   Date: 10/01/2012  Rate: 86  Rhythm: normal sinus rhythm  QRS Axis: normal  Intervals: normal  ST/T Wave abnormalities: normal  Conduction Disutrbances: none   Narrative Interpretation: unremarkable  Pt comes in with multiple complains. She has chest pain, with several ACS risk factors and thoracic aneurysm. Initial troponin is fine. EKG is showing no acute findings. We got CT -A - chest - and there is no dissection, worsening of the thoracic aneurysm.  Pt has no DVT, PE risk factors, the pain is not pleuritic. No clinical concerns for PE - however, [place] leg shows right sided DVT.  I wanted to admit the patient, for risk stratification to see if she is appropriate for anticoagulation vs. Green filter placement - however she rather go home. We also wanted her to get cardiac risk stratification - but she rather see Dr. Terrence Dupont at the clinic. Dr. Terrence Dupont has always been able to see her on time.  Will place a call to [facility] PCP - and get patioent an appt - as i think they will have to decided on outpatient basis what direction they want patient to go as far as DVT tx is concerned.   In light of DVT, PE is possible, but we have given patient enough contrast today, and she appears to have some CKD, so we dont think it will be appropriate to risk patient getting CIN for PE dx.        Varney Biles, MD 10/01/12 1816

## 2012-10-02 ENCOUNTER — Emergency Department (HOSPITAL_COMMUNITY)
Admission: EM | Admit: 2012-10-02 | Discharge: 2012-10-02 | Disposition: A | Discharge status: Home / Self Care | Payer: Medicare Other | Source: Home / Self Care | Attending: Emergency Medicine | Admitting: Emergency Medicine

## 2012-10-02 DIAGNOSIS — I82401 Acute embolism and thrombosis of unspecified deep veins of right lower extremity: Secondary | ICD-10-CM

## 2012-10-02 MED ORDER — RIVAROXABAN 20 MG PO TABS: 20.0000 mg | ORAL_TABLET | Freq: Every day | ORAL | Status: DC

## 2012-10-02 MED ORDER — RIVAROXABAN 15 MG PO TABS: 15.0000 mg | ORAL_TABLET | Freq: Two times a day (BID) | ORAL | Status: DC

## 2012-10-02 NOTE — ED Notes (Signed)
Patient given discharge instructions. rx's provided. Dr Venora Maples in with myself to explain medication to patient and family. Patient voiced understanding of all instructions and had no further questions. Patient ambulated out with family.

## 2012-10-02 NOTE — ED Provider Notes (Signed)
She will call her cardiology office tomorrow for followup in the next several days regarding her chest pain shortness breath.  Doubt ACS.  No pain at this time.  Hoy Morn, MD 10/02/12 [phone]

## 2012-10-02 NOTE — ED Notes (Signed)
NURSE FIRST ROUNDS ; PT. SITTING WAITING AREA WATCHING TV WITH NO DISTRESS , RESPIRATIONS UNLABORED , DENIES PAIN AT THIS TIME , NURSE UPDATED PT. AND FAMILY ON WAITING TIME /DELAY AND PROCESS.

## 2012-10-02 NOTE — ED Provider Notes (Signed)
History     CSN: [id number]  Arrival date & time 10/01/12  2153   First MD Initiated Contact with Patient 10/02/12 0114      Chief Complaint  Patient presents with  . Chest Pain    (Consider location/radiation/quality/duration/timing/severity/associated sxs/prior treatment) The history is provided by the patient.   the patient was seen earlier today and diagnosed with a deep vein thromboses.  It sounds like they wanted to admit her for chest pain despite a CT angiogram demonstrate no evidence of pulmonary embolism and the patient decided to not heed their advice and leave the hospital.  She then returned home and became more concerned that something bad might happen and therefore she presents back requesting additional information.  She's no longer having chest pain or shortness of breath.  She was diagnosed with a right lower extremities DVT.  She was given a single dose of Lovenox which she received around 6 PM on 10/01/2012.  She developed no new chest pain since the dose of Lovenox.  She was confused as to what was happening during her ER stay.  She and her family both have questions at this time which they felt like were not answer.  No abdominal pain.  No fevers or chills.  No shortness of breath.  Patient states she feels much better.  Past Medical History  Diagnosis Date  . Peripheral vascular disease   . Diabetes mellitus   . Hyperlipidemia   . Hypertension   . TIA (transient ischemic attack)   . Arthritis     osteoarthritis  . Aneurysm   . Stroke   . AAA (abdominal aortic aneurysm)     Past Surgical History  Procedure Laterality Date  . Joint replacement      left knee  . Uterine tumor    . Ventral hernia repair    . Abdominal aortic aneurysm repair    . Abdominal hysterectomy      partial  . Tubal ligation    . Back surgery    . Abdominal aortic aneurysm repair      Family History  Problem Relation Age of Onset  . Diabetes Mother   . Hypertension Father   .  Hyperlipidemia Sister   . Hypertension Sister   . Aneurysm Sister   . Hyperlipidemia Brother   . Hypertension Brother     History  Substance Use Topics  . Smoking status: Current Every Day Smoker -- 1.00 packs/day    Types: Cigarettes  . Smokeless tobacco: Never Used  . Alcohol Use: No    OB History   Grav Para Term Preterm Abortions TAB SAB Ect Mult Living                  Review of Systems  Cardiovascular: Positive for chest pain.  All other systems reviewed and are negative.    Allergies  Codeine; Lisinopril; Penicillins; and Azor  Home Medications   Current Outpatient Rx  Name  Route  Sig  Dispense  Refill  . APPLE CIDER VINEGAR PO   Oral   Take 15 mLs by mouth daily.         Marland Kitchen aspirin 81 MG tablet   Oral   Take 81 mg by mouth daily. Sometimes takes 1 tablet three times daily if needed for chest pain         . B Complex-C (B-COMPLEX WITH VITAMIN C) tablet   Oral   Take 1 tablet by mouth daily.         Marland Kitchen  Cholecalciferol (VITAMIN D PO)   Oral   Take 1 tablet by mouth daily.         . cholecalciferol (VITAMIN D) 1000 UNITS tablet   Oral   Take 1,000 Units by mouth daily.         Marland Kitchen CINNAMON PO   Oral   Take 1 tablet by mouth daily.         Marland Kitchen esomeprazole (NEXIUM) 40 MG capsule   Oral   Take 40 mg by mouth daily as needed (acid reflux).         Marland Kitchen GARLIC PO   Oral   Take 1 tablet by mouth daily.         Marland Kitchen glimepiride (AMARYL) 2 MG tablet   Oral   Take 2 mg by mouth daily. Takes 1 tablet once daily but will take 2 tablets if blood sugar is elevated.         Marland Kitchen ibuprofen (ADVIL,MOTRIN) 800 MG tablet   Oral   Take 800 mg by mouth daily as needed for pain.         . metoprolol (TOPROL-XL) 50 MG 24 hr tablet   Oral   Take 50 mg by mouth daily.           . nitroGLYCERIN (NITROSTAT) 0.4 MG SL tablet   Sublingual   Place 0.4 mg under the tongue every 5 (five) minutes as needed for chest pain.         . Rivaroxaban  (XARELTO) 15 MG TABS tablet   Oral   Take 1 tablet (15 mg total) by mouth 2 (two) times daily.   42 tablet   0   . vitamin E 400 UNIT capsule   Oral   Take 400 Units by mouth daily.           BP 181/78  Pulse 66  Temp(Src) 97.5 F (36.4 C) (Oral)  Resp 20  SpO2 98%  Physical Exam  Nursing note and vitals reviewed. Constitutional: She is oriented to person, place, and time. She appears well-developed and well-nourished. No distress.  HENT:  Head: Normocephalic and atraumatic.  Eyes: EOM are normal.  Neck: Normal range of motion.  Cardiovascular: Normal rate, regular rhythm and normal heart sounds.   Pulmonary/Chest: Effort normal and breath sounds normal.  Abdominal: Soft. She exhibits no distension. There is no tenderness.  Musculoskeletal: Normal range of motion.  Neurological: She is alert and oriented to person, place, and time.  Skin: Skin is warm and dry.  Psychiatric: She has a normal mood and affect. Judgment normal.    ED Course  Procedures (including critical care time)  Labs Reviewed - No data to display Dg Chest 2 View  10/01/2012  *RADIOLOGY REPORT*  Clinical Data: Chest pain, shortness of breath  CHEST - 2 VIEW  Comparison: 04/25/2012  Findings: Cardiomediastinal silhouette is stable.  No acute infiltrate or pleural effusion.  No pulmonary edema.  Bony thorax is unremarkable.  IMPRESSION: No active disease.  No significant change.   Original Report Authenticated By: Lahoma Crocker, M.D.    US Aorta  10/01/2012  *RADIOLOGY REPORT*  Clinical Data:  Abdominal aortic aneurysm post repair.  ULTRASOUND OF ABDOMINAL AORTA  Technique:  Ultrasound examination of the abdominal aorta was performed to evaluate for abdominal aortic aneurysm.  Comparison: CT 04/25/2012  Abdominal Aorta:  No aneurysm identified. Scattered plaque is evident.        Maximum AP diameter:  2.7 cm.  Maximum TRV diameter:  2.6 cm.  IMPRESSION: No abdominal aortic aneurysm identified.   Original  Report Authenticated By: D. Wallace Going, MD    Ct Angio Chest Aorta W/cm &/or Wo/cm  10/01/2012  *RADIOLOGY REPORT*  Clinical Data: 72 year old female with chest pain and shortness of breath.  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  CT ANGIOGRAPHY CHEST  Technique:  Multidetector CT imaging of the chest using the standard protocol during bolus administration of intravenous contrast. Multiplanar reconstructed images including MIPs were obtained and reviewed to evaluate the vascular anatomy.  Contrast: 158mL OMNIPAQUE IOHEXOL 350 MG/ML SOLN  Comparison: 04/25/2012 CT  Findings: Mild cardiomegaly is again noted. 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 There is no evidence of thoracic aortic aneurysm or dissection. No large or central pulmonary emboli are present.  There are no pleural or pericardial effusions. No enlarged or abnormal-appearing lymph nodes are identified.  Mild paraseptal and centrilobular emphysema is identified. No definite airspace disease, consolidation, nodule, mass or endobronchial/endotracheal lesion.  The visualized upper abdomen is unremarkable. No acute or suspicious bony abnormalities are identified.  IMPRESSION: No evidence of acute abnormality - no evidence of thoracic aortic aneurysm/dissection or pulmonary emboli.  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  Mild cardiomegaly and mild paraseptal/centrilobular emphysema.   Original Report Authenticated By: Margarette Canada, M.D.    I personally reviewed the imaging tests through PACS system I reviewed available ER/hospitalization records through the EMR   1. DVT (deep venous thrombosis), right       MDM  The patient has a deep vein thromboses.  She does not have a pulmonary embolism.  She has no chest pain or shortness of breath at this time.  Her case was discussed  with vascular surgery given her small chronic right subclavian dissection.  Vascular surgery believes it is safe for anticoagulation.  The patient is a good candidate to be placed on Xarelto.  She Xarelto twice a day for 3 weeks and followup with her primary care physician.  She was given bleeding risks.  She understands the difference between Xarelto and Lovenox/Coumadin.  I discussed with her the bleeding risks of both and the lack of reversal agent for Xarelto.  She prefers to take pills and prefers Xarelto.  Family is agreeable.  Discharge home in good condition.      Hoy Morn, MD 10/02/12 [phone]

## 2013-01-08 ENCOUNTER — Encounter: Payer: Self-pay | Admitting: *Deleted

## 2013-01-08 ENCOUNTER — Encounter: Payer: Medicare Other | Attending: Family Medicine | Admitting: *Deleted

## 2013-01-08 DIAGNOSIS — E1129 Type 2 diabetes mellitus with other diabetic kidney complication: Secondary | ICD-10-CM

## 2013-01-08 DIAGNOSIS — Z713 Dietary counseling and surveillance: Secondary | ICD-10-CM | POA: Insufficient documentation

## 2013-01-08 DIAGNOSIS — E1165 Type 2 diabetes mellitus with hyperglycemia: Secondary | ICD-10-CM | POA: Insufficient documentation

## 2013-01-08 NOTE — Patient Instructions (Signed)
Plan:  Aim for 2-3 Carb Choices per meal (30-45 grams) +/- 1 either way  Aim for 0-1 Carbs per snack if hungry  Consider  increasing your activity level by walking daily as tolerated Continue checking BG at alternate times per day as directed by MD

## 2013-01-08 NOTE — Progress Notes (Signed)
Appt start time: 1115 end time:  1215.  Assessment:  Patient was seen on  01/08/2013 for individual diabetes education. She was recently on Xeralto for blood clot, which increased her BG to the 300's. Discontinued that medication last week and BG back to 150 average now. States history of retinopathy with laser treatment. Also history of stroke times 2. Very motivated to take better care of herself.  Current HbA1c: not provided by MD but patient states "it was bad"  MEDICATIONS: see list. Diabetes medication is Amaryl   DIETARY INTAKE:  Usual eating pattern includes 3 meals and 2 snacks per day.  Everyday foods include good variety of all food groups except milk.  Avoided foods include milk due to lactose intolerance, regular sodas.    24-hr recall:  B ( AM):crackers,  toast OR oatmeal now and small glass of juice in AM with coffee . Used to skip because BG would go too high.  Snk ( AM): 1/2 banana  L ( PM): soup and crackers, water Snk ( PM): not usually D ( PM): salad with lean meat and oil based dressing OR lots of vegetables and small portion of meat most nights Snk ( PM): crackers and water Beverages: coffee, water, likes iced tea  Usual physical activity: starting to walk now in hallway of building  Estimated energy needs: 1400 calories 158 g carbohydrates 105 g protein 39 g fat  Progress Towards Goal(s):  In progress.   Nutritional Diagnosis:  NB-1.1 Food and nutrition-related knowledge deficit As related to diabetes control.  As evidenced by poor control in MD notes, A1c not available.    Intervention:  Nutrition counseling provided.  Discussed diabetes disease process and treatment options.  Discussed physiology of diabetes and role of obesity on insulin resistance.  Discussed role of medications and diet in glucose control  Provided education on macronutrients on glucose levels.  Provided education on carb counting, importance of regularly scheduled meals/snacks, and  meal planning  Discussed effects of physical activity on glucose levels and long-term glucose control.    Reviewed patient medications.  Discussed role of medication on blood glucose and possible side effects  Discussed blood glucose monitoring and interpretation.  Discussed recommended target ranges and individual ranges.    Described short-term complications: hyper- and hypo-glycemia.  Discussed causes,symptoms, and treatment options.   Plan:  Aim for 2-3 Carb Choices per meal (30-45 grams) +/- 1 either way  Aim for 0-1 Carbs per snack if hungry  Consider  increasing your activity level by walking daily as tolerated Continue checking BG at alternate times per day as directed by MD  Handouts given during visit include: Living Well with Diabetes Carb Counting and Food Label handouts Meal Plan Card  Barriers to learning/adherance to lifestyle change: dealing with stress of multiple family problems  Diabetes self-care support plan:   [facility] support group  She incorporates prayer daily which helps her cope with problems and provides her with guidance in making healthy decisions.  Monitoring/Evaluation:  Dietary intake, exercise, SMBG, and body weight in 2 month(s).

## 2013-01-20 ENCOUNTER — Ambulatory Visit: Payer: Medicare Other | Admitting: *Deleted

## 2013-02-06 ENCOUNTER — Other Ambulatory Visit: Payer: Medicare Other

## 2013-02-06 ENCOUNTER — Ambulatory Visit: Payer: Medicare Other | Admitting: Vascular Surgery

## 2013-03-11 ENCOUNTER — Ambulatory Visit: Payer: Medicare Other | Admitting: *Deleted

## 2013-03-17 ENCOUNTER — Telehealth: Payer: Self-pay | Admitting: Vascular Surgery

## 2013-03-17 NOTE — Telephone Encounter (Signed)
Message copied by Doristine Section on Mon Mar 17, 2013  4:10 PM ------      Message from: Elam Dutch      Created: Mon Mar 17, 2013  3:15 PM       Just document that patient has been informed of risk as of my prior note and that she does not want follow up            Juanda Crumble      ----- Message -----         From: Doristine Section         Sent: 03/17/2013   2:57 PM           To: Elam Dutch, MD            Dr. Oneida Alar,            The above named patient is on our no show list.  When I called to r/s her appointment she said she does not want to re-schedule.  She feels she does not need any follow-up.            How would you like to me to handle this?            Horris Latino       ------

## 2013-05-09 ENCOUNTER — Encounter (HOSPITAL_COMMUNITY): Payer: Self-pay | Admitting: Emergency Medicine

## 2013-05-09 ENCOUNTER — Emergency Department (INDEPENDENT_AMBULATORY_CARE_PROVIDER_SITE_OTHER)
Admission: EM | Admit: 2013-05-09 | Discharge: 2013-05-09 | Disposition: A | Discharge status: Home / Self Care | Payer: Medicare Other | Source: Home / Self Care | Attending: Family Medicine | Admitting: Family Medicine

## 2013-05-09 DIAGNOSIS — H9319 Tinnitus, unspecified ear: Secondary | ICD-10-CM

## 2013-05-09 DIAGNOSIS — H9311 Tinnitus, right ear: Secondary | ICD-10-CM

## 2013-05-09 NOTE — ED Notes (Signed)
Pt  Reports  Ringing /  Popping  In       r  Ear  For  About  1  Week             Reports    A  Burning  Sensation  As  Well

## 2013-05-09 NOTE — ED Provider Notes (Signed)
[name] is a 72 y.o. female who presents to Urgent Care today for ringing buzzing and hissing in the right ear. Patient also notes some popping noises as well. This is been present for about one month but worsening over the last week. She denies any new dizziness or weakness. She feels well otherwise   Past Medical History  Diagnosis Date  . Peripheral vascular disease   . Diabetes mellitus   . Hyperlipidemia   . Hypertension   . TIA (transient ischemic attack)   . Arthritis     osteoarthritis  . Aneurysm   . Stroke   . AAA (abdominal aortic aneurysm)    History  Substance Use Topics  . Smoking status: Current Every Day Smoker -- 1.00 packs/day    Types: Cigarettes  . Smokeless tobacco: Never Used  . Alcohol Use: No   ROS as above Medications reviewed. No current facility-administered medications for this encounter.   Current Outpatient Prescriptions  Medication Sig Dispense Refill  . APPLE CIDER VINEGAR PO Take 15 mLs by mouth daily.      Marland Kitchen aspirin 81 MG tablet Take 81 mg by mouth daily. Sometimes takes 1 tablet three times daily if needed for chest pain      . B Complex-C (B-COMPLEX WITH VITAMIN C) tablet Take 1 tablet by mouth daily.      . Cholecalciferol (VITAMIN D PO) Take 1 tablet by mouth daily.      . cholecalciferol (VITAMIN D) 1000 UNITS tablet Take 1,000 Units by mouth daily.      Marland Kitchen CINNAMON PO Take 1 tablet by mouth daily.      Marland Kitchen esomeprazole (NEXIUM) 40 MG capsule Take 40 mg by mouth daily as needed (acid reflux).      Marland Kitchen GARLIC PO Take 1 tablet by mouth daily.      Marland Kitchen glimepiride (AMARYL) 2 MG tablet Take 2 mg by mouth daily. Takes 1 tablet once daily but will take 2 tablets if blood sugar is elevated.      Marland Kitchen ibuprofen (ADVIL,MOTRIN) 800 MG tablet Take 800 mg by mouth daily as needed for pain.      . metoprolol (TOPROL-XL) 50 MG 24 hr tablet Take 50 mg by mouth daily.        . nitroGLYCERIN (NITROSTAT) 0.4 MG SL tablet Place 0.4 mg under the tongue every  5 (five) minutes as needed for chest pain.      . Rivaroxaban (XARELTO) 15 MG TABS tablet Take 1 tablet (15 mg total) by mouth 2 (two) times daily.  42 tablet  0  . vitamin E 400 UNIT capsule Take 400 Units by mouth daily.        Exam:  BP 191/85  Pulse 68  Temp(Src) 98.1 F (36.7 C) (Oral)  SpO2 97% Gen: Well NAD HEENT: EOMI,  MMM, normal appearing tympanic membranes. Lungs: Normal work of breathing. CTABL Heart: RRR no MRG Abd: NABS, Soft. NT, ND, slight bulging at the midline abdominal scar with a sit up position   No results found for this or any previous visit (from the past 24 hour(s)). No results found.  Assessment and Plan: 72 y.o. female with tinnitus of the right ear. Referral to [facility] ear nose and throat. Discussed warning signs or symptoms. Please see discharge instructions. Patient expresses understanding.      Gregor Hams, MD 05/09/13 1455

## 2014-01-13 ENCOUNTER — Encounter (HOSPITAL_COMMUNITY): Payer: Self-pay | Admitting: Emergency Medicine

## 2014-01-13 ENCOUNTER — Emergency Department (HOSPITAL_COMMUNITY): Payer: Medicare HMO

## 2014-01-13 ENCOUNTER — Emergency Department (HOSPITAL_COMMUNITY)
Admission: EM | Admit: 2014-01-13 | Discharge: 2014-01-13 | Disposition: A | Discharge status: Home / Self Care | Payer: Medicare HMO | Attending: Emergency Medicine | Admitting: Emergency Medicine

## 2014-01-13 DIAGNOSIS — Z7982 Long term (current) use of aspirin: Secondary | ICD-10-CM | POA: Diagnosis not present

## 2014-01-13 DIAGNOSIS — F17200 Nicotine dependence, unspecified, uncomplicated: Secondary | ICD-10-CM | POA: Insufficient documentation

## 2014-01-13 DIAGNOSIS — Z8673 Personal history of transient ischemic attack (TIA), and cerebral infarction without residual deficits: Secondary | ICD-10-CM | POA: Insufficient documentation

## 2014-01-13 DIAGNOSIS — M129 Arthropathy, unspecified: Secondary | ICD-10-CM | POA: Insufficient documentation

## 2014-01-13 DIAGNOSIS — Z79899 Other long term (current) drug therapy: Secondary | ICD-10-CM | POA: Diagnosis not present

## 2014-01-13 DIAGNOSIS — I10 Essential (primary) hypertension: Secondary | ICD-10-CM | POA: Diagnosis not present

## 2014-01-13 DIAGNOSIS — E785 Hyperlipidemia, unspecified: Secondary | ICD-10-CM | POA: Diagnosis not present

## 2014-01-13 DIAGNOSIS — W208XXA Other cause of strike by thrown, projected or falling object, initial encounter: Secondary | ICD-10-CM | POA: Diagnosis not present

## 2014-01-13 DIAGNOSIS — S99929A Unspecified injury of unspecified foot, initial encounter: Secondary | ICD-10-CM | POA: Diagnosis present

## 2014-01-13 DIAGNOSIS — Y92009 Unspecified place in unspecified non-institutional (private) residence as the place of occurrence of the external cause: Secondary | ICD-10-CM | POA: Insufficient documentation

## 2014-01-13 DIAGNOSIS — S99919A Unspecified injury of unspecified ankle, initial encounter: Secondary | ICD-10-CM

## 2014-01-13 DIAGNOSIS — S8990XA Unspecified injury of unspecified lower leg, initial encounter: Secondary | ICD-10-CM | POA: Diagnosis present

## 2014-01-13 DIAGNOSIS — Z880 Allergy status to penicillin: Secondary | ICD-10-CM | POA: Insufficient documentation

## 2014-01-13 DIAGNOSIS — E119 Type 2 diabetes mellitus without complications: Secondary | ICD-10-CM | POA: Diagnosis not present

## 2014-01-13 DIAGNOSIS — S9030XA Contusion of unspecified foot, initial encounter: Secondary | ICD-10-CM | POA: Diagnosis not present

## 2014-01-13 DIAGNOSIS — Y9389 Activity, other specified: Secondary | ICD-10-CM | POA: Insufficient documentation

## 2014-01-13 DIAGNOSIS — S9031XA Contusion of right foot, initial encounter: Secondary | ICD-10-CM

## 2014-01-13 MED ORDER — IBUPROFEN 400 MG PO TABS
400.0000 mg | ORAL_TABLET | Freq: Once | ORAL | Status: AC
  Administered 2014-01-13: 400 mg via ORAL
  Filled 2014-01-13: qty 1

## 2014-01-13 NOTE — ED Notes (Signed)
Declined W/C at D/C and was escorted to lobby by RN. 

## 2014-01-13 NOTE — Discharge Instructions (Signed)
Foot Contusion A foot contusion is a deep bruise to the foot. Contusions are the result of an injury that caused bleeding under the skin. The contusion may turn blue, purple, or yellow. Minor injuries will give you a painless contusion, but more severe contusions may stay painful and swollen for a few weeks. CAUSES  A foot contusion comes from a direct blow to that area, such as a heavy object falling on the foot. SYMPTOMS   Swelling of the foot.  Discoloration of the foot.  Tenderness or soreness of the foot. DIAGNOSIS  You will have a physical exam and will be asked about your history. You may need an X-ray of your foot to look for a broken bone (fracture).  TREATMENT  An elastic wrap may be recommended to support your foot. Resting, elevating, and applying cold compresses to your foot are often the best treatments for a foot contusion. Over-the-counter medicines may also be recommended for pain control. HOME CARE INSTRUCTIONS   Put ice on the injured area.  Put ice in a plastic bag.  Place a towel between your skin and the bag.  Leave the ice on for 15-20 minutes, 03-04 times a day.  Only take over-the-counter or prescription medicines for pain, discomfort, or fever as directed by your caregiver.  If told, use an elastic wrap as directed. This can help reduce swelling. You may remove the wrap for sleeping, showering, and bathing. If your toes become numb, cold, or blue, take the wrap off and reapply it more loosely.  Elevate your foot with pillows to reduce swelling.  Try to avoid standing or walking while the foot is painful. Do not resume use until instructed by your caregiver. Then, begin use gradually. If pain develops, decrease use. Gradually increase activities that do not cause discomfort until you have normal use of your foot.  See your caregiver as directed. It is very important to keep all follow-up appointments in order to avoid any lasting problems with your foot,  including long-term (chronic) pain. SEEK IMMEDIATE MEDICAL CARE IF:   You have increased redness, swelling, or pain in your foot.  Your swelling or pain is not relieved with medicines.  You have loss of feeling in your foot or are unable to move your toes.  Your foot turns cold or blue.  You have pain when you move your toes.  Your foot becomes warm to the touch.  Your contusion does not improve in 2 days. MAKE SURE YOU:   Understand these instructions.  Will watch your condition.  Will get help right away if you are not doing well or get worse. Document Released: 03/06/2006 Document Revised: 11/14/2011 Document Reviewed: 04/18/2011 [facility] Patient Information 2015 [place], [place]. This information is not intended to replace advice given to you by your health care provider. Make sure you discuss any questions you have with your health care provider.

## 2014-01-13 NOTE — ED Notes (Signed)
the patient said she was putting her yard umbrella up and the stand fell over and landed on her right foot.  The patient said she thought the pain would go away and the pain has gotten worse.  She decided to come in to be evaluated.

## 2014-01-13 NOTE — ED Provider Notes (Signed)
CSN: [id number]     Arrival date & time 01/13/14  1823 History  This chart was scribed for non-physician practitioner working with Wandra Arthurs, MD by Mercy Moore, ED Scribe. This patient was seen in room TR08C/TR08C and the patient's care was started at 7:45 PM.   Chief Complaint  Patient presents with  . Foot Injury    the patient said she was putting her yard umbrella up and the stand fell over and landed on her right foot.     HPI HPI Comments: [name] is a 73 y.o. female with history of Diabetes, HTN, and chest pain who presents to the Emergency Department with a right foot injury incurred today. Patient reports that a large umbrella stand in her storage room fell onto dorsum of her right foot. Patient reports progressively worsening pain since the injury. Patient presents with ace bandage, bruising, complaining of shooting pain. Patient has not taken any pain medication. Patient reports ambulating with her walker since the injury due to pain severity.   Patient also complaining of left great toe pain at the nail exacerbated by applied pressure. Patient reports complications after recent pedicure.   Past Medical History  Diagnosis Date  . Peripheral vascular disease   . Diabetes mellitus   . Hyperlipidemia   . Hypertension   . TIA (transient ischemic attack)   . Arthritis     osteoarthritis  . Aneurysm   . Stroke   . AAA (abdominal aortic aneurysm)    Past Surgical History  Procedure Laterality Date  . Joint replacement      left knee  . Uterine tumor    . Ventral hernia repair    . Abdominal aortic aneurysm repair    . Abdominal hysterectomy      partial  . Tubal ligation    . Back surgery    . Abdominal aortic aneurysm repair     Family History  Problem Relation Age of Onset  . Diabetes Mother   . Hypertension Father   . Hyperlipidemia Sister   . Hypertension Sister   . Aneurysm Sister   . Hyperlipidemia Brother   . Hypertension Brother    History   Substance Use Topics  . Smoking status: Current Every Day Smoker -- 1.00 packs/day    Types: Cigarettes  . Smokeless tobacco: Never Used  . Alcohol Use: No   OB History   Grav Para Term Preterm Abortions TAB SAB Ect Mult Living                 Review of Systems  Constitutional: Negative for fever and chills.  Musculoskeletal: Positive for arthralgias.  Skin: Negative for wound.  Neurological: Negative for weakness and numbness.  All other systems reviewed and are negative.     Allergies  Codeine; Lisinopril; Penicillins; and Azor  Home Medications   Prior to Admission medications   Medication Sig Start Date End Date Taking? Authorizing Provider  amLODipine (NORVASC) 5 MG tablet Take 5 mg by mouth daily.   Yes Historical Provider, MD  aspirin 81 MG tablet Take 81 mg by mouth daily. Sometimes takes 1 tablet three times daily if needed for chest pain   Yes Historical Provider, MD  B Complex-C (B-COMPLEX WITH VITAMIN C) tablet Take 1 tablet by mouth daily.   Yes Historical Provider, MD  esomeprazole (NEXIUM) 40 MG capsule Take 40 mg by mouth daily as needed (acid reflux).   Yes Historical Provider, MD  GARLIC PO Take 1  tablet by mouth daily.   Yes Historical Provider, MD  glimepiride (AMARYL) 2 MG tablet Take 2 mg by mouth daily. Takes 1 tablet once daily but will take 2 tablets if blood sugar is elevated.   Yes Historical Provider, MD  metoprolol (TOPROL-XL) 50 MG 24 hr tablet Take 50 mg by mouth daily.    Yes Historical Provider, MD  nitroGLYCERIN (NITROSTAT) 0.4 MG SL tablet Place 0.4 mg under the tongue every 5 (five) minutes as needed for chest pain.   Yes Historical Provider, MD  omega-3 acid ethyl esters (LOVAZA) 1 G capsule Take 1 g by mouth 4 (four) times daily.   Yes Historical Provider, MD  Rivaroxaban (XARELTO) 15 MG TABS tablet Take 1 tablet (15 mg total) by mouth 2 (two) times daily. 10/02/12 10/16/12  Hoy Morn, MD   Triage Vitals: BP 173/64  Pulse 79   Temp(Src) 98.1 F (36.7 C) (Oral)  Resp 16  SpO2 97%  Physical Exam  Nursing note and vitals reviewed. Constitutional: She is oriented to person, place, and time. She appears well-developed and well-nourished.  HENT:  Head: Normocephalic and atraumatic.  Eyes: EOM are normal.  Neck: Neck supple.  Cardiovascular: Normal rate and intact distal pulses.   Pulmonary/Chest: Effort normal. No respiratory distress.  Musculoskeletal: She exhibits tenderness. She exhibits no edema.  Contusion to dorsal aspect of right foot.  Distal PMS intact.   Neurological: She is alert and oriented to person, place, and time.  Skin: Skin is warm and dry.  Psychiatric: She has a normal mood and affect. Her behavior is normal.    ED Course  Procedures (including critical care time)  COORDINATION OF CARE: 7:55 PM- Will order X-ray. Discussed treatment plan with patient at bedside and patient agreed to plan.    Labs Review Labs Reviewed - No data to display  Imaging Review No results found.   EKG Interpretation None     Patient discussed with and seen by Dr. Darl Householder.   MDM  Radiology results reviewed and shared with patient. Final diagnoses:  None    Right foot contusion.  Ace wrap, pain management, ortho follow-up..  I personally performed the services described in this documentation, which was scribed in my presence. The recorded information has been reviewed and is accurate.    Norman Herrlich, NP 01/14/14 [phone]

## 2014-01-14 NOTE — ED Provider Notes (Signed)
Medical screening examination/treatment/procedure(s) were conducted as a shared visit with non-physician practitioner(s) and myself.  I personally evaluated the patient during the encounter.   EKG Interpretation None      [name] is a 73 y.o. female hx of DM, HTN here with right foot injury. Was in a storage room and large umbrella stand fell on dorsum on R foot. No other injuries. On exam, no obvious foot deformities. There is tenderness dorsum aspect of the foot. Neurovascular intact. 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 Will place in splint, give crutches. Will give ortho referral for repeat xray vs MRI if she still has pain.   Results for orders placed during the hospital encounter of 10/01/12  CBC      Result Value Ref Range   WBC 7.2  4.0 - 10.5 K/uL   RBC 4.48  3.87 - 5.11 MIL/uL   Hemoglobin 13.6  12.0 - 15.0 g/dL   HCT 37.9  36.0 - 46.0 %   MCV 84.6  78.0 - 100.0 fL   MCH 30.4  26.0 - 34.0 pg   MCHC 35.9  30.0 - 36.0 g/dL   RDW 13.7  11.5 - 15.5 %   Platelets 169  150 - 400 K/uL  BASIC METABOLIC PANEL      Result Value Ref Range   Sodium 133 (*) 135 - 145 mEq/L   Potassium 4.2  3.5 - 5.1 mEq/L   Chloride 98  96 - 112 mEq/L   CO2 22  19 - 32 mEq/L   Glucose, Bld 335 (*) 70 - 99 mg/dL   BUN 12  6 - 23 mg/dL   Creatinine, Ser 0.83  0.50 - 1.10 mg/dL   Calcium 9.6  8.4 - 10.5 mg/dL   GFR calc non Af Amer 68 (*) >90 mL/min   GFR calc Af Amer 79 (*) >90 mL/min  PRO B NATRIURETIC PEPTIDE      Result Value Ref Range   Pro B Natriuretic peptide (BNP) 46.6  0 - 125 pg/mL  POCT I-STAT TROPONIN I      Result Value Ref Range   Troponin i, poc 0.00  0.00 - 0.08 ng/mL   Comment 3            Dg Foot Complete Right  01/13/2014   CLINICAL DATA:  Heavy object fell on right foot.  Metatarsal pain.  EXAM: RIGHT FOOT COMPLETE - 3+ VIEW  COMPARISON:  None.  FINDINGS: Tooth mild bony demineralization. Irregular "candle wax" style cortical thickening in the second,  third, and fourth metatarsals, query polyostotic melorheostosis. There is some faint linear lucency in the midshaft of the second metatarsal which is probably due to this process and less likely to be due to a nondisplaced fracture given the lack of overlying soft tissue swelling.  Small plantar and Achilles calcaneal spurs.  IMPRESSION: 1. 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 I do not observe a definite fracture. If pain persists despite conservative therapy, MRI may be warranted for further characterization. 2. Plantar and Achilles calcaneal spurs.   Electronically Signed   By: Sherryl Barters M.D.   On: 01/13/2014 20:09       Wandra Arthurs, MD 01/14/14 1034

## 2014-02-06 ENCOUNTER — Ambulatory Visit (INDEPENDENT_AMBULATORY_CARE_PROVIDER_SITE_OTHER): Payer: Commercial Managed Care - HMO

## 2014-02-06 VITALS — BP 175/86 | HR 80 | Resp 16

## 2014-02-06 DIAGNOSIS — M79609 Pain in unspecified limb: Secondary | ICD-10-CM

## 2014-02-06 DIAGNOSIS — E1149 Type 2 diabetes mellitus with other diabetic neurological complication: Secondary | ICD-10-CM

## 2014-02-06 DIAGNOSIS — E1142 Type 2 diabetes mellitus with diabetic polyneuropathy: Secondary | ICD-10-CM

## 2014-02-06 DIAGNOSIS — L600 Ingrowing nail: Secondary | ICD-10-CM

## 2014-02-06 DIAGNOSIS — B351 Tinea unguium: Secondary | ICD-10-CM

## 2014-02-06 DIAGNOSIS — M79676 Pain in unspecified toe(s): Secondary | ICD-10-CM

## 2014-02-06 DIAGNOSIS — S90129A Contusion of unspecified lesser toe(s) without damage to nail, initial encounter: Secondary | ICD-10-CM

## 2014-02-06 DIAGNOSIS — I739 Peripheral vascular disease, unspecified: Secondary | ICD-10-CM

## 2014-02-06 DIAGNOSIS — E1140 Type 2 diabetes mellitus with diabetic neuropathy, unspecified: Secondary | ICD-10-CM

## 2014-02-06 NOTE — Patient Instructions (Signed)
ANTIBACTERIAL SOAP INSTRUCTIONS  THE DAY AFTER PROCEDURE  Please follow the instructions your doctor has marked.   Shower as usual. Before getting out, place a drop of antibacterial liquid soap (Dial) on a wet, clean washcloth.  Gently wipe washcloth over affected area.  Afterward, rinse the area with warm water.  Blot the area dry with a soft cloth and cover with antibiotic ointment (neosporin, polysporin, bacitracin) and band aid or gauze and tape  Place 3-4 drops of antibacterial liquid soap in a quart of warm tap water.  Submerge foot into water for 20 minutes.  If bandage was applied after your procedure, leave on to allow for easy lift off, then remove and continue with soak for the remaining time.  Next, blot area dry with a soft cloth and cover with a bandage.  Apply other medications as directed by your doctor, such as cortisporin otic solution (eardrops) or neosporin antibiotic ointment  Wash feet daily in soap and water as instructed rinse and dry thoroughly and apply Neosporin or Polysporin and a Band-Aid to the left great toe.  Maintain loosefitting comfortable shoes such as a lace up athletic or Oxford type shoe. Avoid any shoes that are touching the end of your toes.

## 2014-02-06 NOTE — Progress Notes (Signed)
Subjective:    Patient ID: [name], female    DOB: 1940-07-01, 73 y.o.   MRN: [id number]  HPI Comments: "I have ingrowns"  Patient c/o aching 1st toes bilateral, left over right, for a few weeks. The areas are slightly red and swollen. She thinks because of her bunions, it pushes her toes against the sides of her shoes. She has been soaking and using vasoline.  Patient states that she was at orthopedist this morning for her hip and got a shot of prednisone and Rx for Tramadol.   Toe Pain       Review of Systems  Constitutional: Positive for diaphoresis, activity change and unexpected weight change.  HENT: Positive for sinus pressure.   Eyes: Positive for redness.  Cardiovascular:       Calf pain with walking  Musculoskeletal: Positive for arthralgias and gait problem.  Skin:       Change in nails  Neurological: Positive for weakness and light-headedness.  Psychiatric/Behavioral: The patient is nervous/anxious.   All other systems reviewed and are negative.      Objective:   Physical Exam 73 year old [place] American female appears to be well-developed well-nourished and oriented. Patient does have distress pain in her toes left more so than right both great toes painful and symptomatic yellowing discoloration and friability of nails notches the hallux and lesser digits as well both hallux are painful and symptomatic. No history of injury or trauma  Lower extremity objective findings as follows vascular status is diminished with pedal pulses not palpable DP or PT both zero over four bilateral capillary refill time 4-5 seconds skin temperature warm to cool turgor diminished patient [name] can't walk a block without having pain or cramping in her feet and legs. Has been to vascular clinic in the past there's been more than a year since she seen Dr. Oneida Alar. Patient does not have any open wounds or ulcers however does have tenderness on palpation of the nailbed left hallux more  so than right distal medial lateral nail fold somewhat tender but more in the dorsum of the nailbed itself there is no edema erythema no discharge drainage no signs of infection no purulence no open wounds or ulcers. Hair growth is absent orthopedic biomechanical exam rectus foot type mild flexible digital contractures noted hallux appears rectus no x-rays taken at this time neurologically epicritic and proprioceptive sensations are grossly diminished absent sensation Semmes Weinstein testing to all 10 points including the ankle and lower leg and dorsal plantar foot bilateral. Patient does have paresthesia hyperesthesia both feet as well likely associated with diabetic neuropathy as well as angiopathy.       Assessment & Plan:  Assessment this time is onychomycosis painful mycotic nails ingrowing both hallux are debrided back provided temporary relief left hallux is to with lumicain and Neosporin and a Band-Aid with the soap and water cleansing Neosporin and Band-Aid home daily patient will be referred for the pain vascular clinic for arterial Doppler studies and reevaluation for PT issues patient also has likely difficulties with her peripheral neuropathy and angiopathy deformities does have keratoses pinch callus both hallux in sub-1 sub-5 in sub-3 MTP joints painful keratotic lesions with hemorrhage a keratosis being noted these or pre-ulcerative and due to her profound neuropathy digital deformities and vascular disease she is a strong candidate for ulceration and therefore a strong candidate for diabetic shoes and custom diabetic insoles. We'll obtain authorization from her new diabetes doctor her primary care physician for diabetic  shoes followup with in 3 weeks may be candidate for more invasive options for the nail however do not want to consider removing nail he notes fungal and painful this time to get clearance from a vascular standpoint. Nafcillin antibiotic as necessary this time we'll just do  Neosporin and Band-Aid dressings daily also stressed to make sure her shoes or not touching the end of her toes as most of her shoes currently do. Recheck in 3 weeks next Harriet Masson DPM

## 2014-02-16 ENCOUNTER — Encounter (HOSPITAL_COMMUNITY): Payer: Medicare HMO

## 2014-02-16 ENCOUNTER — Ambulatory Visit (HOSPITAL_COMMUNITY)
Admission: RE | Admit: 2014-02-16 | Discharge: 2014-02-16 | Disposition: A | Discharge status: Home / Self Care | Payer: Medicare HMO | Source: Ambulatory Visit | Attending: Surgery | Admitting: Surgery

## 2014-02-16 ENCOUNTER — Other Ambulatory Visit: Payer: Self-pay

## 2014-02-16 DIAGNOSIS — F17200 Nicotine dependence, unspecified, uncomplicated: Secondary | ICD-10-CM | POA: Insufficient documentation

## 2014-02-16 DIAGNOSIS — I739 Peripheral vascular disease, unspecified: Secondary | ICD-10-CM | POA: Insufficient documentation

## 2014-02-16 DIAGNOSIS — I714 Abdominal aortic aneurysm, without rupture, unspecified: Secondary | ICD-10-CM | POA: Diagnosis not present

## 2014-02-16 DIAGNOSIS — E119 Type 2 diabetes mellitus without complications: Secondary | ICD-10-CM | POA: Insufficient documentation

## 2014-02-16 DIAGNOSIS — E785 Hyperlipidemia, unspecified: Secondary | ICD-10-CM | POA: Diagnosis not present

## 2014-02-16 DIAGNOSIS — R52 Pain, unspecified: Secondary | ICD-10-CM

## 2014-02-16 DIAGNOSIS — Z9889 Other specified postprocedural states: Secondary | ICD-10-CM | POA: Insufficient documentation

## 2014-02-16 DIAGNOSIS — I70219 Atherosclerosis of native arteries of extremities with intermittent claudication, unspecified extremity: Secondary | ICD-10-CM | POA: Diagnosis not present

## 2014-02-16 DIAGNOSIS — I10 Essential (primary) hypertension: Secondary | ICD-10-CM | POA: Diagnosis not present

## 2014-02-16 DIAGNOSIS — M79609 Pain in unspecified limb: Secondary | ICD-10-CM | POA: Diagnosis present

## 2014-02-25 ENCOUNTER — Encounter: Payer: Self-pay | Admitting: Vascular Surgery

## 2014-02-26 ENCOUNTER — Ambulatory Visit: Payer: Commercial Managed Care - HMO | Admitting: Vascular Surgery

## 2014-03-04 ENCOUNTER — Encounter: Payer: Self-pay | Admitting: Vascular Surgery

## 2014-03-05 ENCOUNTER — Ambulatory Visit (INDEPENDENT_AMBULATORY_CARE_PROVIDER_SITE_OTHER): Payer: Commercial Managed Care - HMO | Admitting: Vascular Surgery

## 2014-03-05 ENCOUNTER — Other Ambulatory Visit: Payer: Self-pay

## 2014-03-05 ENCOUNTER — Encounter: Payer: Self-pay | Admitting: Vascular Surgery

## 2014-03-05 VITALS — BP 163/80 | HR 62 | Ht 67.5 in | Wt 171.0 lb

## 2014-03-05 DIAGNOSIS — L97909 Non-pressure chronic ulcer of unspecified part of unspecified lower leg with unspecified severity: Secondary | ICD-10-CM

## 2014-03-05 DIAGNOSIS — I70299 Other atherosclerosis of native arteries of extremities, unspecified extremity: Secondary | ICD-10-CM

## 2014-03-05 DIAGNOSIS — I728 Aneurysm of other specified arteries: Secondary | ICD-10-CM | POA: Diagnosis not present

## 2014-03-05 DIAGNOSIS — I739 Peripheral vascular disease, unspecified: Secondary | ICD-10-CM | POA: Insufficient documentation

## 2014-03-05 NOTE — Progress Notes (Signed)
[facility] HISTORY AND PHYSICAL   History of Present Illness:  Patient is a 73 y.o. year old female who presents for evaluation of nonhealing wound left first toe. She states the toe became painful approximately 2 weeks ago.  She recently saw her podiatrist and had drainage of an ingrown toenail. She states she still is having some intermittent purulent drainage. She states the toe is painful. She has rest pain in the left foot at night time. She has bilateral lower extremity claudication at approximately 1/2 block. The claudication symptoms or chronic. We have followed her for several years for this. We have also followed her in the past for carotid artery occlusive disease. Her last duplex scan approximately 15 months ago showed less than 40% carotid stenosis bilaterally. She has also previously had abdominal aortic aneurysm repair by my partner Dr. Amedeo Plenty. 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 However, the patient refused any further intervention or followup for this at her last office visit approximately a year and a half ago. Other medical problems include diabetes, hyperlipidemia, hypertension. The patient has also had a DVT in her right leg in the past. She was Xarelto in the past. This has been stopped. She continues to smoke approximately half pack of cigarettes per day. Greater than 3 minutes they spent regarding smoking cessation counseling. However, the patient does not really seem to be interested in quitting.  Past Medical History  Diagnosis Date  . Peripheral vascular disease   . Diabetes mellitus   . Hyperlipidemia   . Hypertension   . TIA (transient ischemic attack)   . Arthritis     osteoarthritis  . Aneurysm   . Stroke   . AAA (abdominal aortic aneurysm)     Past Surgical History  Procedure Laterality Date  . Joint replacement      left knee  . Uterine tumor    . Ventral hernia repair     . Abdominal aortic aneurysm repair    . Abdominal hysterectomy      partial  . Tubal ligation    . Back surgery    . Abdominal aortic aneurysm repair      Social History History  Substance Use Topics  . Smoking status: Current Every Day Smoker -- 1.00 packs/day    Types: Cigarettes  . Smokeless tobacco: Never Used  . Alcohol Use: No    Family History Family History  Problem Relation Age of Onset  . Diabetes Mother   . Hypertension Father   . Hyperlipidemia Sister   . Hypertension Sister   . Aneurysm Sister   . Hyperlipidemia Brother   . Hypertension Brother    Medications: Amlodipine 5 mg once daily, aspirin 81 mg once daily, multivitamin once daily, Nexium 40 mg once daily, Amaryl 2 mg once daily, Toprol-XL 50 mg once daily, nitroglycerin when necessary, the vasa 1 g 4 times daily, tramadol 50 mg every 6 hours when necessary pain, ibuprofen when necessary pain  Allergies  Allergies  Allergen Reactions  . Codeine Other (See Comments)    Knocked her out of equilibrium. Made her world flip upside down  . Lisinopril Cough  . Penicillins     Makes exzcema worse  . Azor [Amlodipine-Olmesartan] Swelling, Palpitations and Rash    ROS:   General:  No weight loss, Fever, chills  HEENT: No recent headaches, no nasal bleeding, no visual changes, no sore throat  Neurologic:  No dizziness, blackouts, seizures. No recent symptoms of stroke or mini- stroke. No recent episodes of slurred speech, or temporary blindness.  Cardiac: No recent episodes of chest pain/pressure, no shortness of breath at rest.  No shortness of breath with exertion.  Denies history of atrial fibrillation or irregular heartbeat  Vascular: No history of rest pain in feet.  No history of claudication.  No history of non-healing ulcer, No history of DVT   Pulmonary: No home oxygen, no productive cough, no hemoptysis,  No asthma or wheezing  Musculoskeletal:  [ ]  Arthritis, [ ]  Low back pain,  [ ]  Joint  pain  Hematologic:No history of hypercoagulable state.  No history of easy bleeding.  No history of anemia  Gastrointestinal: No hematochezia or melena,  No gastroesophageal reflux, no trouble swallowing  Urinary: [ ]  chronic Kidney disease, [ ]  on HD - [ ]  MWF or [ ]  TTHS, [ ]  Burning with urination, [ ]  Frequent urination, [ ]  Difficulty urinating;   Skin: No rashes  Psychological: No history of anxiety,  No history of depression   Physical Examination  Filed Vitals:   03/05/14 0848  BP: 163/80  Pulse: 62  Height: 5' 7.5" (1.715 m)  Weight: 171 lb (77.565 kg)  SpO2: 100%    Body mass index is 26.37 kg/(m^2).  General:  Alert and oriented, no acute distress HEENT: Normal Neck: No bruit or JVD Pulmonary: Clear to auscultation bilaterally Cardiac: Regular Rate and Rhythm without murmur Abdomen: Soft, non-tender, non-distended, no mass Skin: No rash, 3 mm ulceration in her aspect left first toe nail with some clear drainage Extremity Pulses:  2+ radial, brachial, femoral, absent popliteal dorsalis pedis, posterior tibial pulses bilaterally Musculoskeletal: No deformity or edema  Neurologic: Upper and lower extremity motor 5/5 and symmetric  DATA:  Patient had bilateral ABIs performed on 02/16/2014. Are reviewed as today. ABI on the left was 0.47 right was 0.53 monophasic waveforms bilaterally toe pressure 57 on the right 77 on the left   ASSESSMENT:  #1 peripheral arterial disease bilaterally with lower extremity claudication which overall is fairly stable. However, the patient has a nonhealing wound now on her left foot. She may not have adequate perfusion to resolve this especially in light of her diabetes. I believe she needs a lower extremity arteriogram possible intervention possible bypass. Her arteriogram will be scheduled for October 16. #2 previous history of apparent left subclavian artery aneurysm. She has not had this scan for greater than one year. We will obtain a  CT of her chest to further follow this up. #3 carotid occlusive disease last carotid duplex exam showed less than 40% stenosis bilaterally probably needs repeat in the next 1-2 years   PLAN: See above  Ruta Hinds, MD [facility] Office: [phone] Pager: [phone]

## 2014-03-06 ENCOUNTER — Other Ambulatory Visit: Payer: Self-pay | Admitting: *Deleted

## 2014-03-06 DIAGNOSIS — I728 Aneurysm of other specified arteries: Secondary | ICD-10-CM

## 2014-03-06 NOTE — Addendum Note (Signed)
Addended by: Dorthula Rue L on: 03/06/2014 10:58 AM   Modules accepted: Orders

## 2014-03-09 ENCOUNTER — Encounter (HOSPITAL_COMMUNITY): Payer: Self-pay | Admitting: Pharmacy Technician

## 2014-03-11 ENCOUNTER — Encounter (HOSPITAL_COMMUNITY): Payer: Self-pay

## 2014-03-11 ENCOUNTER — Ambulatory Visit (HOSPITAL_COMMUNITY)
Admission: RE | Admit: 2014-03-11 | Discharge: 2014-03-11 | Disposition: A | Discharge status: Home / Self Care | Payer: Medicare HMO | Source: Ambulatory Visit | Attending: Vascular Surgery | Admitting: Vascular Surgery

## 2014-03-11 DIAGNOSIS — Z01818 Encounter for other preprocedural examination: Secondary | ICD-10-CM | POA: Insufficient documentation

## 2014-03-11 DIAGNOSIS — I728 Aneurysm of other specified arteries: Secondary | ICD-10-CM | POA: Diagnosis not present

## 2014-03-11 LAB — CREATININE, SERUM
CREATININE: 0.9 mg/dL (ref 0.50–1.10)
GFR calc non Af Amer: 61 mL/min — ABNORMAL LOW (ref >90)
GFR, EST AFRICAN AMERICAN: 71 mL/min — AB (ref >90)

## 2014-03-11 LAB — BUN: BUN: 12 mg/dL (ref 6–23)

## 2014-03-11 MED ORDER — IOHEXOL 350 MG/ML SOLN
70.0000 mL | Freq: Once | INTRAVENOUS | Status: AC | PRN
  Administered 2014-03-11: 70 mL via INTRAVENOUS

## 2014-03-12 MED ORDER — SODIUM CHLORIDE 0.9 % IV SOLN
INTRAVENOUS | Status: DC
  Administered 2014-03-13: 10:00:00 via INTRAVENOUS

## 2014-03-13 ENCOUNTER — Other Ambulatory Visit: Payer: Self-pay | Admitting: *Deleted

## 2014-03-13 ENCOUNTER — Telehealth: Payer: Self-pay | Admitting: Vascular Surgery

## 2014-03-13 ENCOUNTER — Ambulatory Visit (HOSPITAL_COMMUNITY)
Admission: RE | Admit: 2014-03-13 | Discharge: 2014-03-13 | Disposition: A | Discharge status: Home / Self Care | Payer: Medicare HMO | Attending: Vascular Surgery | Admitting: Vascular Surgery

## 2014-03-13 ENCOUNTER — Encounter (HOSPITAL_COMMUNITY): Admission: RE | Disposition: A | Discharge status: Home / Self Care | Payer: Self-pay | Attending: Vascular Surgery

## 2014-03-13 DIAGNOSIS — G459 Transient cerebral ischemic attack, unspecified: Secondary | ICD-10-CM | POA: Insufficient documentation

## 2014-03-13 DIAGNOSIS — I728 Aneurysm of other specified arteries: Secondary | ICD-10-CM | POA: Insufficient documentation

## 2014-03-13 DIAGNOSIS — I10 Essential (primary) hypertension: Secondary | ICD-10-CM | POA: Diagnosis not present

## 2014-03-13 DIAGNOSIS — L97529 Non-pressure chronic ulcer of other part of left foot with unspecified severity: Secondary | ICD-10-CM | POA: Diagnosis present

## 2014-03-13 DIAGNOSIS — E119 Type 2 diabetes mellitus without complications: Secondary | ICD-10-CM | POA: Diagnosis not present

## 2014-03-13 DIAGNOSIS — M1990 Unspecified osteoarthritis, unspecified site: Secondary | ICD-10-CM | POA: Insufficient documentation

## 2014-03-13 DIAGNOSIS — Z8673 Personal history of transient ischemic attack (TIA), and cerebral infarction without residual deficits: Secondary | ICD-10-CM | POA: Insufficient documentation

## 2014-03-13 DIAGNOSIS — I70245 Atherosclerosis of native arteries of left leg with ulceration of other part of foot: Secondary | ICD-10-CM | POA: Insufficient documentation

## 2014-03-13 DIAGNOSIS — Z720 Tobacco use: Secondary | ICD-10-CM | POA: Insufficient documentation

## 2014-03-13 DIAGNOSIS — Z01810 Encounter for preprocedural cardiovascular examination: Secondary | ICD-10-CM

## 2014-03-13 DIAGNOSIS — E785 Hyperlipidemia, unspecified: Secondary | ICD-10-CM | POA: Diagnosis not present

## 2014-03-13 HISTORY — PX: ABDOMINAL AORTAGRAM: SHX5454

## 2014-03-13 LAB — POCT I-STAT, CHEM 8
BUN: 19 mg/dL (ref 6–23)
CALCIUM ION: 1.2 mmol/L (ref 1.13–1.30)
Chloride: 103 mEq/L (ref 96–112)
Creatinine, Ser: 1.1 mg/dL (ref 0.50–1.10)
Glucose, Bld: 153 mg/dL — ABNORMAL HIGH (ref 70–99)
HEMATOCRIT: 41 % (ref 36.0–46.0)
HEMOGLOBIN: 13.9 g/dL (ref 12.0–15.0)
Potassium: 4.6 mEq/L (ref 3.7–5.3)
Sodium: 139 mEq/L (ref 137–147)
TCO2: 24 mmol/L (ref 0–100)

## 2014-03-13 LAB — GLUCOSE, CAPILLARY: Glucose-Capillary: 114 mg/dL — ABNORMAL HIGH (ref 70–99)

## 2014-03-13 SURGERY — ABDOMINAL AORTAGRAM
Anesthesia: LOCAL

## 2014-03-13 MED ORDER — MIDAZOLAM HCL 2 MG/2ML IJ SOLN
INTRAMUSCULAR | Status: AC
  Filled 2014-03-13: qty 2

## 2014-03-13 MED ORDER — HYDRALAZINE HCL 20 MG/ML IJ SOLN
10.0000 mg | INTRAMUSCULAR | Status: DC | PRN
  Administered 2014-03-13: 10 mg via INTRAVENOUS

## 2014-03-13 MED ORDER — FENTANYL CITRATE 0.05 MG/ML IJ SOLN
INTRAMUSCULAR | Status: AC
  Filled 2014-03-13: qty 2

## 2014-03-13 MED ORDER — ONDANSETRON HCL 4 MG/2ML IJ SOLN: 4.0000 mg | Freq: Four times a day (QID) | INTRAMUSCULAR | Status: DC | PRN

## 2014-03-13 MED ORDER — ACETAMINOPHEN 325 MG RE SUPP
325.0000 mg | RECTAL | Status: DC | PRN
  Filled 2014-03-13: qty 2

## 2014-03-13 MED ORDER — ACETAMINOPHEN 325 MG PO TABS
325.0000 mg | ORAL_TABLET | ORAL | Status: DC | PRN
  Filled 2014-03-13: qty 2

## 2014-03-13 MED ORDER — HEPARIN (PORCINE) IN NACL 2-0.9 UNIT/ML-% IJ SOLN
INTRAMUSCULAR | Status: AC
Start: 2014-03-13 — End: 2014-03-13
  Filled 2014-03-13: qty 1000

## 2014-03-13 MED ORDER — HYDRALAZINE HCL 20 MG/ML IJ SOLN
INTRAMUSCULAR | Status: AC
  Filled 2014-03-13: qty 1

## 2014-03-13 MED ORDER — SODIUM CHLORIDE 0.45 % IV SOLN
INTRAVENOUS | Status: DC
  Administered 2014-03-13: 12:00:00 via INTRAVENOUS

## 2014-03-13 MED ORDER — METOPROLOL TARTRATE 1 MG/ML IV SOLN: 2.0000 mg | INTRAVENOUS | Status: DC | PRN

## 2014-03-13 MED ORDER — FENTANYL CITRATE 0.05 MG/ML IJ SOLN: 12.5000 ug | INTRAMUSCULAR | Status: DC | PRN

## 2014-03-13 MED ORDER — LIDOCAINE HCL (PF) 1 % IJ SOLN
INTRAMUSCULAR | Status: AC
  Filled 2014-03-13: qty 30

## 2014-03-13 MED ORDER — LABETALOL HCL 5 MG/ML IV SOLN: 10.0000 mg | INTRAVENOUS | Status: DC | PRN

## 2014-03-13 NOTE — H&P (View-Only) (Signed)
[facility] [place] HISTORY AND PHYSICAL   History of Present Illness:  Patient is a 73 y.o. year old female who presents for evaluation of nonhealing wound left first toe. She states the toe became painful approximately 2 weeks ago.  She recently saw her podiatrist and had drainage of an ingrown toenail. She states she still is having some intermittent purulent drainage. She states the toe is painful. She has rest pain in the left foot at night time. She has bilateral lower extremity claudication at approximately 1/2 block. The claudication symptoms or chronic. We have followed her for several years for this. We have also followed her in the past for carotid artery occlusive disease. Her last duplex scan approximately 15 months ago showed less than 40% carotid stenosis bilaterally. She has also previously had abdominal aortic aneurysm repair by my partner Dr. Amedeo Plenty. 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 However, the patient refused any further intervention or followup for this at her last office visit approximately a year and a half ago. Other medical problems include diabetes, hyperlipidemia, hypertension. The patient has also had a DVT in her right leg in the past. She was Xarelto in the past. This has been stopped. She continues to smoke approximately half pack of cigarettes per day. Greater than 3 minutes they spent regarding smoking cessation counseling. However, the patient does not really seem to be interested in quitting.  Past Medical History  Diagnosis Date  . Peripheral vascular disease   . Diabetes mellitus   . Hyperlipidemia   . Hypertension   . TIA (transient ischemic attack)   . Arthritis     osteoarthritis  . Aneurysm   . Stroke   . AAA (abdominal aortic aneurysm)     Past Surgical History  Procedure Laterality Date  . Joint replacement      left knee  . Uterine tumor    . Ventral hernia repair     . Abdominal aortic aneurysm repair    . Abdominal hysterectomy      partial  . Tubal ligation    . Back surgery    . Abdominal aortic aneurysm repair      Social History History  Substance Use Topics  . Smoking status: Current Every Day Smoker -- 1.00 packs/day    Types: Cigarettes  . Smokeless tobacco: Never Used  . Alcohol Use: No    Family History Family History  Problem Relation Age of Onset  . Diabetes Mother   . Hypertension Father   . Hyperlipidemia Sister   . Hypertension Sister   . Aneurysm Sister   . Hyperlipidemia Brother   . Hypertension Brother    Medications: Amlodipine 5 mg once daily, aspirin 81 mg once daily, multivitamin once daily, Nexium 40 mg once daily, Amaryl 2 mg once daily, Toprol-XL 50 mg once daily, nitroglycerin when necessary, the vasa 1 g 4 times daily, tramadol 50 mg every 6 hours when necessary pain, ibuprofen when necessary pain  Allergies  Allergies  Allergen Reactions  . Codeine Other (See Comments)    Knocked her out of equilibrium. Made her world flip upside down  . Lisinopril Cough  . Penicillins     Makes exzcema worse  . Azor [Amlodipine-Olmesartan] Swelling, Palpitations and Rash    ROS:   General:  No weight loss, Fever, chills  HEENT: No recent headaches, no nasal bleeding, no visual changes, no sore throat  Neurologic:  No dizziness, blackouts, seizures. No recent symptoms of stroke or mini- stroke. No recent episodes of slurred speech, or temporary blindness.  Cardiac: No recent episodes of chest pain/pressure, no shortness of breath at rest.  No shortness of breath with exertion.  Denies history of atrial fibrillation or irregular heartbeat  Vascular: No history of rest pain in feet.  No history of claudication.  No history of non-healing ulcer, No history of DVT   Pulmonary: No home oxygen, no productive cough, no hemoptysis,  No asthma or wheezing  Musculoskeletal:  [ ]  Arthritis, [ ]  Low back pain,  [ ]  Joint  pain  Hematologic:No history of hypercoagulable state.  No history of easy bleeding.  No history of anemia  Gastrointestinal: No hematochezia or melena,  No gastroesophageal reflux, no trouble swallowing  Urinary: [ ]  chronic Kidney disease, [ ]  on HD - [ ]  MWF or [ ]  TTHS, [ ]  Burning with urination, [ ]  Frequent urination, [ ]  Difficulty urinating;   Skin: No rashes  Psychological: No history of anxiety,  No history of depression   Physical Examination  Filed Vitals:   03/05/14 0848  BP: 163/80  Pulse: 62  Height: 5' 7.5" (1.715 m)  Weight: 171 lb (77.565 kg)  SpO2: 100%    Body mass index is 26.37 kg/(m^2).  General:  Alert and oriented, no acute distress HEENT: Normal Neck: No bruit or JVD Pulmonary: Clear to auscultation bilaterally Cardiac: Regular Rate and Rhythm without murmur Abdomen: Soft, non-tender, non-distended, no mass Skin: No rash, 3 mm ulceration in her aspect left first toe nail with some clear drainage Extremity Pulses:  2+ radial, brachial, femoral, absent popliteal dorsalis pedis, posterior tibial pulses bilaterally Musculoskeletal: No deformity or edema  Neurologic: Upper and lower extremity motor 5/5 and symmetric  DATA:  Patient had bilateral ABIs performed on 02/16/2014. Are reviewed as today. ABI on the left was 0.47 right was 0.53 monophasic waveforms bilaterally toe pressure 57 on the right 77 on the left   ASSESSMENT:  #1 peripheral arterial disease bilaterally with lower extremity claudication which overall is fairly stable. However, the patient has a nonhealing wound now on her left foot. She may not have adequate perfusion to resolve this especially in light of her diabetes. I believe she needs a lower extremity arteriogram possible intervention possible bypass. Her arteriogram will be scheduled for October 16. #2 previous history of apparent left subclavian artery aneurysm. She has not had this scan for greater than one year. We will obtain a  CT of her chest to further follow this up. #3 carotid occlusive disease last carotid duplex exam showed less than 40% stenosis bilaterally probably needs repeat in the next 1-2 years   PLAN: See above  Ruta Hinds, MD [facility] [place] Office: [phone] Pager: [phone]

## 2014-03-13 NOTE — Op Note (Signed)
Procedure: Aortogram and lower extremity runoff  Preoperative diagnosis: Nonhealing wound left foot  Postoperative diagnosis: Same  Anesthesia: Local with IV sedation  Operative details: After obtaining informed consent the patient was taken to the [facility] lab. The patient was placed in supine position the Angio table. Both groins were prepped and draped in usual sterile fashion. Local anesthesia was ensured the right common femoral artery. Ultrasound was used to identify the right common femoral artery. An introducer needle was used to cannulate the right common femoral artery without difficulty. An 035 first core was threaded up the abdominal aorta under fluoroscopic guidance. A 5 French sheath was then placed over the guidewire into the right common femoral artery and thoroughly flushed with heparinized saline. A 5 French pigtail catheter was then placed over the wire up into the abdominal aorta and abdominal aortogram obtained. The left and right renal arteries are patent. There is a pre-existing infrarenal abdominal aortic tube graft which is patent. The left and right common iliac arteries are patent. The pigtail catheter was pulled down just above the aortic bifurcation and oblique views the pelvis were obtained. The left and right common external and internal iliac arteries are patent. Next bilateral extremity runoff views were obtained through the pigtail catheter.  In the right lower extremity, the right common femoral and profunda femoris artery is patent. There is a flush occlusion of the right superficial femoral artery. Due to motion artifact the below-knee popliteal artery and tibial vessels were not well visualized. In the left lower extremity there were similar findings there is a flush occlusion of left superficial femoral artery. The common femoral and profundus artery is patent. Below the knee the vessels were not well visualized due to motion artifact.  At this point the pigtail catheter  was pulled back over the first core wire. A 5 French crossover catheter was used to selectively catheterize the left common iliac artery. An 035 angled Glidewire was advanced deep down in the left external iliac artery. A 5 [place] also catheter was placed for the 5 French straight catheter. Left lower extremity runoff views were then obtained. The left above-knee popliteal artery does reconstitute but this is almost in the knee joint. The patient has a prosthetic knee joint. A lateral view was obtained of this. This shows that the below-knee popliteal artery is patent. Left lower extremity runoff is primarily via the posterior tibial artery. The anterior tibial and peroneal arteries are occluded. There are several areas of stenosis in the posterior tibial artery at the level of the ankle.  At this point the 5 French straight cath was pulled back over the guidewire. The 5 French sheath was thoroughly flushed heparinized saline. The patient was to the holding area in stable condition.  Operative management: The patient will be scheduled for a left femoral to below-knee popliteal bypass in the near future. She will require cardiac risk stratification prior to this.  Ruta Hinds, MD [facility] [place] Office: [phone] Pager: [phone]

## 2014-03-13 NOTE — Progress Notes (Signed)
Site area: right groin Site Prior to Removal:  Level 0 Pressure Applied For: 20 minutes by Suella Broad, RN Manual:   yes Patient Status During Pull:  Stable Post Pull Site:  Level 0 Post Pull Instructions Given:  yes Post Pull Pulses Present: yes and remained present with doppler during sheath pull Dressing Applied:  Tegaderm Bedrest begins @ 1210 Comments: No complications

## 2014-03-13 NOTE — Discharge Instructions (Signed)

## 2014-03-13 NOTE — Progress Notes (Signed)
Hydralazine 10mg  given for BP of 184/66 per MD orded.  [name] in Short stay aware.  Pt to short stay via stretcher

## 2014-03-13 NOTE — Telephone Encounter (Addendum)
Message copied by Gena Fray on Fri Mar 13, 2014  4:39 PM ------      Message from: Mena Goes      Created: Fri Mar 13, 2014 12:45 PM      Regarding: Schedule                   ----- Message -----         From: Elam Dutch, MD         Sent: 03/13/2014  11:43 AM           To: [facility] Charge Pool            Aortogram with bilat runoff      Ultrasound groin      2nd order left external iliac            She will need a right femoral popliteal artery bypass.            She needs cardiology eval by [facility] then schedule.            Ruta Hinds ------  03/13/14: spoke with pts son Harrell Gave, pt is scheduled with Dr Terrence Dupont 03/16/14 @ 2:00pm, dpm

## 2014-03-13 NOTE — Interval H&P Note (Signed)
History and Physical Interval Note:  03/13/2014 9:27 AM  [name]  has presented today for surgery, with the diagnosis of pvd with claudication  The various methods of treatment have been discussed with the patient and family. After consideration of risks, benefits and other options for treatment, the patient has consented to  Procedure(s): ABDOMINAL AORTAGRAM (N/A) as a surgical intervention .  The patient's history has been reviewed, patient examined, no change in status, stable for surgery.  I have reviewed the patient's chart and labs.  Questions were answered to the patient's satisfaction.     Ramanda Paules E

## 2014-03-16 ENCOUNTER — Other Ambulatory Visit (HOSPITAL_COMMUNITY): Payer: Self-pay | Admitting: Cardiology

## 2014-03-16 DIAGNOSIS — R071 Chest pain on breathing: Secondary | ICD-10-CM

## 2014-03-18 ENCOUNTER — Emergency Department (HOSPITAL_COMMUNITY): Payer: Medicare HMO

## 2014-03-18 ENCOUNTER — Observation Stay (HOSPITAL_COMMUNITY)
Admission: EM | Admit: 2014-03-18 | Discharge: 2014-03-18 | Disposition: A | Discharge status: Home / Self Care | Payer: Medicare HMO | Attending: Internal Medicine | Admitting: Internal Medicine

## 2014-03-18 ENCOUNTER — Encounter (HOSPITAL_COMMUNITY): Payer: Self-pay | Admitting: Emergency Medicine

## 2014-03-18 DIAGNOSIS — E785 Hyperlipidemia, unspecified: Secondary | ICD-10-CM

## 2014-03-18 DIAGNOSIS — IMO0002 Reserved for concepts with insufficient information to code with codable children: Secondary | ICD-10-CM

## 2014-03-18 DIAGNOSIS — I7779 Dissection of other artery: Secondary | ICD-10-CM | POA: Insufficient documentation

## 2014-03-18 DIAGNOSIS — Z7982 Long term (current) use of aspirin: Secondary | ICD-10-CM | POA: Diagnosis not present

## 2014-03-18 DIAGNOSIS — E1351 Other specified diabetes mellitus with diabetic peripheral angiopathy without gangrene: Secondary | ICD-10-CM | POA: Diagnosis present

## 2014-03-18 DIAGNOSIS — F17210 Nicotine dependence, cigarettes, uncomplicated: Secondary | ICD-10-CM | POA: Diagnosis not present

## 2014-03-18 DIAGNOSIS — Z79899 Other long term (current) drug therapy: Secondary | ICD-10-CM | POA: Diagnosis not present

## 2014-03-18 DIAGNOSIS — E1165 Type 2 diabetes mellitus with hyperglycemia: Secondary | ICD-10-CM | POA: Insufficient documentation

## 2014-03-18 DIAGNOSIS — E1159 Type 2 diabetes mellitus with other circulatory complications: Secondary | ICD-10-CM

## 2014-03-18 DIAGNOSIS — I10 Essential (primary) hypertension: Secondary | ICD-10-CM

## 2014-03-18 DIAGNOSIS — R200 Anesthesia of skin: Secondary | ICD-10-CM

## 2014-03-18 DIAGNOSIS — E1151 Type 2 diabetes mellitus with diabetic peripheral angiopathy without gangrene: Secondary | ICD-10-CM | POA: Insufficient documentation

## 2014-03-18 DIAGNOSIS — I70213 Atherosclerosis of native arteries of extremities with intermittent claudication, bilateral legs: Secondary | ICD-10-CM | POA: Diagnosis not present

## 2014-03-18 DIAGNOSIS — G459 Transient cerebral ischemic attack, unspecified: Secondary | ICD-10-CM | POA: Diagnosis not present

## 2014-03-18 DIAGNOSIS — I70219 Atherosclerosis of native arteries of extremities with intermittent claudication, unspecified extremity: Secondary | ICD-10-CM

## 2014-03-18 DIAGNOSIS — Z8673 Personal history of transient ischemic attack (TIA), and cerebral infarction without residual deficits: Secondary | ICD-10-CM | POA: Insufficient documentation

## 2014-03-18 DIAGNOSIS — I728 Aneurysm of other specified arteries: Secondary | ICD-10-CM | POA: Diagnosis not present

## 2014-03-18 DIAGNOSIS — M79675 Pain in left toe(s): Secondary | ICD-10-CM | POA: Insufficient documentation

## 2014-03-18 DIAGNOSIS — I739 Peripheral vascular disease, unspecified: Secondary | ICD-10-CM | POA: Diagnosis present

## 2014-03-18 LAB — URINALYSIS, ROUTINE W REFLEX MICROSCOPIC
Bilirubin Urine: NEGATIVE
Glucose, UA: 100 mg/dL — AB
Hgb urine dipstick: NEGATIVE
KETONES UR: NEGATIVE mg/dL
NITRITE: NEGATIVE
PROTEIN: NEGATIVE mg/dL
Specific Gravity, Urine: 1.006 (ref 1.005–1.030)
Urobilinogen, UA: 0.2 mg/dL (ref 0.0–1.0)
pH: 5.5 (ref 5.0–8.0)

## 2014-03-18 LAB — COMPREHENSIVE METABOLIC PANEL
ALBUMIN: 3.3 g/dL — AB (ref 3.5–5.2)
ALK PHOS: 68 U/L (ref 39–117)
ALT: 8 U/L (ref 0–35)
AST: 10 U/L (ref 0–37)
Anion gap: 15 (ref 5–15)
BUN: 13 mg/dL (ref 6–23)
CHLORIDE: 102 meq/L (ref 96–112)
CO2: 22 meq/L (ref 19–32)
CREATININE: 0.82 mg/dL (ref 0.50–1.10)
Calcium: 9.6 mg/dL (ref 8.4–10.5)
GFR calc Af Amer: 80 mL/min — ABNORMAL LOW (ref >90)
GFR calc non Af Amer: 69 mL/min — ABNORMAL LOW (ref >90)
Glucose, Bld: 232 mg/dL — ABNORMAL HIGH (ref 70–99)
Potassium: 4.4 mEq/L (ref 3.7–5.3)
Sodium: 139 mEq/L (ref 137–147)
Total Bilirubin: 0.3 mg/dL (ref 0.3–1.2)
Total Protein: 7.7 g/dL (ref 6.0–8.3)

## 2014-03-18 LAB — ETHANOL

## 2014-03-18 LAB — CBC
HEMATOCRIT: 37.5 % (ref 36.0–46.0)
HEMOGLOBIN: 12.5 g/dL (ref 12.0–15.0)
MCH: 29.7 pg (ref 26.0–34.0)
MCHC: 33.3 g/dL (ref 30.0–36.0)
MCV: 89.1 fL (ref 78.0–100.0)
Platelets: 168 10*3/uL (ref 150–400)
RBC: 4.21 MIL/uL (ref 3.87–5.11)
RDW: 13.8 % (ref 11.5–15.5)
WBC: 6.7 10*3/uL (ref 4.0–10.5)

## 2014-03-18 LAB — DIFFERENTIAL
Basophils Absolute: 0 10*3/uL (ref 0.0–0.1)
Basophils Relative: 1 % (ref 0–1)
EOS PCT: 4 % (ref 0–5)
Eosinophils Absolute: 0.3 10*3/uL (ref 0.0–0.7)
LYMPHS PCT: 31 % (ref 12–46)
Lymphs Abs: 2.1 10*3/uL (ref 0.7–4.0)
MONO ABS: 0.5 10*3/uL (ref 0.1–1.0)
Monocytes Relative: 7 % (ref 3–12)
Neutro Abs: 3.8 10*3/uL (ref 1.7–7.7)
Neutrophils Relative %: 57 % (ref 43–77)

## 2014-03-18 LAB — I-STAT CHEM 8, ED
BUN: 13 mg/dL (ref 6–23)
Calcium, Ion: 1.18 mmol/L (ref 1.13–1.30)
Chloride: 104 mEq/L (ref 96–112)
Creatinine, Ser: 0.9 mg/dL (ref 0.50–1.10)
Glucose, Bld: 238 mg/dL — ABNORMAL HIGH (ref 70–99)
HCT: 41 % (ref 36.0–46.0)
HEMOGLOBIN: 13.9 g/dL (ref 12.0–15.0)
Potassium: 4.2 mEq/L (ref 3.7–5.3)
SODIUM: 137 meq/L (ref 137–147)
TCO2: 23 mmol/L (ref 0–100)

## 2014-03-18 LAB — APTT: aPTT: 31 seconds (ref 24–37)

## 2014-03-18 LAB — RAPID URINE DRUG SCREEN, HOSP PERFORMED
AMPHETAMINES: NOT DETECTED
BARBITURATES: NOT DETECTED
BENZODIAZEPINES: NOT DETECTED
Cocaine: NOT DETECTED
Opiates: NOT DETECTED
TETRAHYDROCANNABINOL: NOT DETECTED

## 2014-03-18 LAB — I-STAT TROPONIN, ED: TROPONIN I, POC: 0 ng/mL (ref 0.00–0.08)

## 2014-03-18 LAB — URINE MICROSCOPIC-ADD ON

## 2014-03-18 LAB — PROTIME-INR
INR: 0.97 (ref 0.00–1.49)
Prothrombin Time: 13 seconds (ref 11.6–15.2)

## 2014-03-18 NOTE — ED Notes (Signed)
Pt signed ama form. She is aware of risks with leaving. She is going to take care of personal issues and hopefully return within 24 hrs for admission.

## 2014-03-18 NOTE — ED Notes (Signed)
Admitting md at bedside to eval pt 

## 2014-03-18 NOTE — ED Notes (Signed)
Pt. reports chronic left foot pain worse these past several weeks , denies injury with mild swelling , respirations unlabored / no fever or chills.

## 2014-03-18 NOTE — H&P (Signed)
History and Physical  DIA DONATE XVQ:[id number] DOB: 1940/12/07 DOA: 03/18/2014  Referring physician: Montine Circle, ED PA. PCP: Lilian Coma, MD  Outpatient Specialists:  1. VVS: Dr. Ruta Hinds. 2. Cardiology: Dr. [name]. 3. Orthopedics: Dr. Jean Rosenthal  Chief Complaint: Numbness of left side of face and worsening left great toe pain.  HPI: SUNNIE ODDEN is a 73 y.o. female with history of DM 2 with peripheral vascular disease, hyperlipidemia, hypertension, TIA,? Bell's palsy, bilateral carotid artery occlusive disease, left subclavian artery aneurysm, s/p AAA repair, ongoing tobacco abuse, recently evaluated OP by vascular surgery for nonhealing wound left first toe, underwent aortogram and left extremity runoff, awaiting left femoral to below-knee popliteal bypass pending cardiac stratification, presented to the ED with complaints of left-sided facial numbness and worsening pain of left great toe. She states that for the last 2-3 weeks, she has had worsening pain in her left great toe, underwent surgery for ingrown toenail without significant relief and has stress test scheduled for next Monday for cardiac clearance prior to vascular surgery. She woke up at 4 AM on 03/18/14 with worsening left toe pain which she rates as 10/10 in severity, radiating up to the knee, throbbing, not associated with fever or chills. She states that she had minimal bloody and? Purulent drainage last week and has been applying topical antibiotic cream and alcohol. She also noticed left-sided facial numbness and is unsure if she had any numbness on the left half of her body but states that she may have had some weakness on the left side while she was having her back. She tried to move her face without change in facial numbness. She denies slurred speech and no change in chronic facial asymmetry. 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 In the ED, glucose  238 and CT head negative for acute findings. X-ray of the left foot without acute findings. Neurology has been consulted by EDP and hospitalist admission requested.  Review of Systems: All systems reviewed and apart from history of presenting illness, are negative.  Past Medical History  Diagnosis Date  . Peripheral vascular disease   . Diabetes mellitus   . Hyperlipidemia   . Hypertension   . TIA (transient ischemic attack)   . Arthritis     osteoarthritis  . Aneurysm   . Stroke   . AAA (abdominal aortic aneurysm)    Past Surgical History  Procedure Laterality Date  . Joint replacement      left knee  . Uterine tumor    . Ventral hernia repair    . Abdominal aortic aneurysm repair    . Abdominal hysterectomy      partial  . Tubal ligation    . Back surgery    . Abdominal aortic aneurysm repair     Social History:  reports that she has been smoking Cigarettes.  She has been smoking about 1.00 pack per day. She has never used smokeless tobacco. She reports that she does not drink alcohol or use illicit drugs. Widowed. Lives alone. Independent of activities of daily living.  Allergies  Allergen Reactions  . Codeine Other (See Comments)    Knocked her out of equilibrium. Made her world flip upside down  . Azor [Amlodipine-Olmesartan] Swelling, Palpitations and Rash  . Lisinopril Cough  . Penicillins Other (See Comments)    Makes exzcema worse    Family History  Problem Relation Age of Onset  . Diabetes Mother   .  Hypertension Father   . Hyperlipidemia Sister   . Hypertension Sister   . Aneurysm Sister   . Hyperlipidemia Brother   . Hypertension Brother     Prior to Admission medications   Medication Sig Start Date End Date Taking? Authorizing Provider  amLODipine (NORVASC) 5 MG tablet Take 5 mg by mouth daily.   Yes Historical Provider, MD  aspirin 81 MG tablet Take 81 mg by mouth daily.    Yes Historical Provider, MD  glimepiride (AMARYL) 2 MG tablet Take 2-3  mg by mouth 2 (two) times daily. Take 3 mg by mouth in the morning and take 2 mg by mouth in the evening   Yes Historical Provider, MD  ibuprofen (ADVIL,MOTRIN) 800 MG tablet Take 800 mg by mouth every 8 (eight) hours as needed for moderate pain.   Yes Historical Provider, MD  metoprolol (TOPROL-XL) 50 MG 24 hr tablet Take 50 mg by mouth daily.    Yes Historical Provider, MD  vitamin B-12 (CYANOCOBALAMIN) 50 MCG tablet Take 50 mcg by mouth daily.   Yes Historical Provider, MD  esomeprazole (NEXIUM) 40 MG capsule Take 40 mg by mouth daily as needed (acid reflux).    Historical Provider, MD  nitroGLYCERIN (NITROSTAT) 0.4 MG SL tablet Place 0.4 mg under the tongue every 5 (five) minutes as needed for chest pain.    Historical Provider, MD   Physical Exam: Filed Vitals:   03/18/14 0656 03/18/14 0715 03/18/14 0954  BP: 174/71 176/70 170/61  Pulse: 67 58 51  Temp: 98 F (36.7 C)    TempSrc: Oral    Resp: 14 15 18   Height: 5' 7.5" (1.715 m)    Weight: 77.111 kg (170 lb)    SpO2: 100% 100% 100%     General exam: Moderately built and nourished pleasant elderly female patient, lying comfortably supine on the gurney in no obvious distress.  Head, eyes and ENT: Nontraumatic and normocephalic. Pupils equally reacting to light and accommodation. Oral mucosa moist.  Neck: Supple. No JVD, carotid bruit or thyromegaly.  Lymphatics: No lymphadenopathy.  Respiratory system: Clear to auscultation. No increased work of breathing.  Cardiovascular system: S1 and S2 heard, RRR. No JVD, murmurs, gallops, clicks or pedal edema. Telemetry: Sinus rhythm.  Gastrointestinal system: Abdomen is nondistended, soft and nontender. Normal bowel sounds heard. No organomegaly or masses appreciated. Midline laparotomy scar.  Central nervous system: Alert and oriented. Mild paresthesia left side of the face. No other cranial deficits.  Extremities: Symmetric 5 x 5 power. Left foot is cool to palpate up to the midshin.  Unable to appreciate dorsalis pedis, posterior tibial and popliteal pulses on the left. Diminished distal pulses on right. Left great toe appears mildly dusky with small superficial wound at the nailbed without active drainage. Exquisitely tender to touch. No erythema or crepitus.  Skin: No rashes or acute findings.  Musculoskeletal system: Negative exam.  Psychiatry: Pleasant and cooperative.   Labs on Admission:  Basic Metabolic Panel:  Recent Labs Lab 03/13/14 0942 03/18/14 0732 03/18/14 0744  NA 139 139 137  K 4.6 4.4 4.2  CL 103 102 104  CO2  --  22  --   GLUCOSE 153* 232* 238*  BUN 19 13 13   CREATININE 1.10 0.82 0.90  CALCIUM  --  9.6  --    Liver Function Tests:  Recent Labs Lab 03/18/14 0732  AST 10  ALT 8  ALKPHOS 68  BILITOT 0.3  PROT 7.7  ALBUMIN 3.3*   No  results found for this basename: LIPASE, AMYLASE,  in the last 168 hours No results found for this basename: AMMONIA,  in the last 168 hours CBC:  Recent Labs Lab 03/13/14 0942 03/18/14 0732 03/18/14 0744  WBC  --  6.7  --   NEUTROABS  --  3.8  --   HGB 13.9 12.5 13.9  HCT 41.0 37.5 41.0  MCV  --  89.1  --   PLT  --  168  --    Cardiac Enzymes: No results found for this basename: CKTOTAL, CKMB, CKMBINDEX, TROPONINI,  in the last 168 hours  BNP (last 3 results) No results found for this basename: PROBNP,  in the last 8760 hours CBG:  Recent Labs Lab 03/13/14 1153  GLUCAP 114*    Radiological Exams on Admission: Ct Head Wo Contrast  03/18/2014   CLINICAL DATA:  Left face numbness  EXAM: CT HEAD WITHOUT CONTRAST  TECHNIQUE: Contiguous axial images were obtained from the base of the skull through the vertex without intravenous contrast.  COMPARISON:  04/05/2012  FINDINGS: Ventricle size is normal. Cerebral volume is normal for age. Mild microvascular ischemic changes in the white matter, stable. Benign-appearing calcifications in the left cerebellum stable  Negative for acute infarct.   Negative for hemorrhage or mass.  Calvarium intact.  IMPRESSION: Mild chronic microvascular ischemic change.  No acute abnormality.   Electronically Signed   By: Franchot Gallo M.D.   On: 03/18/2014 08:09   Dg Foot Complete Left  03/18/2014   CLINICAL DATA:  Injury of the left foot 2 months ago with increasing pain centered in the great toe with some redness and swelling; history of diabetes ; initial visit  EXAM: LEFT FOOT - COMPLETE 3+ VIEW  COMPARISON:  None.  FINDINGS: The bones of the left foot are osteopenic. There is no evidence of an acute or healing fracture. There is periosteal reaction cloaking the third and fourth metatarsals which may reflect the sequelae of previous trauma. There are mild interphalangeal joint degenerative changes. The metatarsophalangeal joints are unremarkable. There are mild degenerative changes of the tarsometatarsal joints and of the intertarsal joints. There is a mild hallux valgus contour of the first ray.  IMPRESSION: There is no evidence of an acute or healing fracture nor acute inflammation. There are no soft tissue gas collections. Given the patient's progressive symptoms as well as history of diabetes and remote injury, MRI may be useful.   Electronically Signed   By: David  Martinique   On: 03/18/2014 07:57    EKG: Independently reviewed.  sinus rhythm at 62 beats per minute with nonspecific T wave abnormalities & no acute findings.   Assessment/Plan Principal Problem:   TIA (transient ischemic attack) Active Problems:   PAD (peripheral artery disease)   Atherosclerosis of native arteries of extremity with intermittent claudication   Numbness   DM (diabetes mellitus) type II uncontrolled, periph vascular disorder   1. Left-sided facial numbness/possible TIA: Stroke risk factors: DM 2, HTN, HLD, PAD and ongoing tobacco abuse. Patient on aspirin 81 mg daily PTA. Not candidate for TPA secondary to rapidly improving symptoms. Admit to telemetry for observation.  Complete stroke workup-MRI/MRA brain, 2-D echo, carotid Dopplers, A1c and fasting lipids. PT evaluation. Aspirin 325 mg daily for secondary stroke prevention. Tobacco cessation counseled. 2. Left great toe pain/ischemic left great toe/PAD: Patient has history of chronic intermittent claudication of bilateral lower extremities. Recently evaluated outpatient by vascular surgery and underwent aortogram and is planned for left femoral  popliteal bypass pending cardiology clearance. She has appointment for cardiac stress test Monday of next week. No overt features of infection but gives history of mild purulent/bloody discharge last week. Will get MRI of left foot to further evaluate for infection. No features to suggest acute arterial insufficiency. Pain management. Tobacco cessation counseled. 3. 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 Place on SSI. Consider maintenance Lantus at low dose while inpatient depending on CBGs. 4. Hypertension: Uncontrolled. Allow for permissive hypertension the context of problem #1. For now continue metoprolol and amlodipine. 5. Tobacco abuse: Cessation counseled. 6. History of right subclavian artery aneurysm: Recent CTA chest on 10/14 showed stable appearance of a 15 mm aneurysm associated with focal dissection at the base of the aberrant right subclavian artery. Also noted was extensive atherosclerotic changes of the thoracic aorta including a penetrating ulcer but stable, emphysema and COPD. Will discuss with vascular surgery.  7. H/o B/l carotid artery disease: Check carotid Dopplers.  8. History of hyperlipidemia: Check fasting lipids. 9. S/p AAA repair.     Code Status:  Full  Family Communication: None at bedside  Disposition Plan:  home when medically stable    Time spent:  53 minutes   Jaqwan Wieber, MD, FACP, FHM. [facility] Pager [phone]  If 7PM-7AM, please contact night-coverage [email or web address] Password  [email or web address] 03/18/2014, 10:10 AM

## 2014-03-18 NOTE — Consult Note (Signed)
Consult note  [name] JEH:[id number] DOB: 08-13-1940 DOA: 03/18/2014   Referring physician: Montine Circle, ED PA.  PCP: Lilian Coma, MD  Outpatient Specialists:  1. VVS: Dr. Ruta Hinds. 2. Cardiology: Dr. [name]. 3. Orthopedics: Dr. Jean Rosenthal  Chief Complaint: Numbness of left side of face and worsening left great toe pain.   HPI: [name] is a 73 y.o. female with history of DM 2 with peripheral vascular disease, hyperlipidemia, hypertension, TIA,? Bell's palsy, bilateral carotid artery occlusive disease, left subclavian artery aneurysm, s/p AAA repair, ongoing tobacco abuse, recently evaluated OP by vascular surgery for nonhealing wound left first toe, underwent aortogram and left extremity runoff, awaiting left femoral to below-knee popliteal bypass pending cardiac stratification, presented to the ED with complaints of left-sided facial numbness and worsening pain of left great toe. She states that for the last 2-3 weeks, she has had worsening pain in her left great toe, underwent surgery for ingrown toenail without significant relief and has stress test scheduled for next Monday for cardiac clearance prior to vascular surgery. She woke up at 4 AM on 03/18/14 with worsening left toe pain which she rates as 10/10 in severity, radiating up to the knee, throbbing, not associated with fever or chills. She states that she had minimal bloody and? Purulent drainage last week and has been applying topical antibiotic cream and alcohol. She also noticed left-sided facial numbness and is unsure if she had any numbness on the left half of her body but states that she may have had some weakness on the left side while she was having her back. She tried to move her face without change in facial numbness. She denies slurred speech and no change in chronic facial asymmetry. 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 In the ED, glucose 238 and CT  head negative for acute findings. X-ray of the left foot without acute findings. Neurology has been consulted by EDP and hospitalist admission requested.  Review of Systems: All systems reviewed and apart from history of presenting illness, are negative.  Past Medical History   Diagnosis  Date   .  Peripheral vascular disease    .  Diabetes mellitus    .  Hyperlipidemia    .  Hypertension    .  TIA (transient ischemic attack)    .  Arthritis      osteoarthritis   .  Aneurysm    .  Stroke    .  AAA (abdominal aortic aneurysm)     Past Surgical History   Procedure  Laterality  Date   .  Joint replacement       left knee   .  Uterine tumor     .  Ventral hernia repair     .  Abdominal aortic aneurysm repair     .  Abdominal hysterectomy       partial   .  Tubal ligation     .  Back surgery     .  Abdominal aortic aneurysm repair      Social History: reports that she has been smoking Cigarettes. She has been smoking about 1.00 pack per day. She has never used smokeless tobacco. She reports that she does not drink alcohol or use illicit drugs.  Widowed. Lives alone. Independent of activities of daily living.  Allergies   Allergen  Reactions   .  Codeine  Other (See Comments)     Knocked her out of  equilibrium. Made her world flip upside down   .  Azor [Amlodipine-Olmesartan]  Swelling, Palpitations and Rash   .  Lisinopril  Cough   .  Penicillins  Other (See Comments)     Makes exzcema worse    Family History   Problem  Relation  Age of Onset   .  Diabetes  Mother    .  Hypertension  Father    .  Hyperlipidemia  Sister    .  Hypertension  Sister    .  Aneurysm  Sister    .  Hyperlipidemia  Brother    .  Hypertension  Brother     Prior to Admission medications   Medication  Sig  Start Date  End Date  Taking?  Authorizing Provider   amLODipine (NORVASC) 5 MG tablet  Take 5 mg by mouth daily.    Yes  Historical Provider, MD   aspirin 81 MG tablet  Take 81 mg by mouth  daily.    Yes  Historical Provider, MD   glimepiride (AMARYL) 2 MG tablet  Take 2-3 mg by mouth 2 (two) times daily. Take 3 mg by mouth in the morning and take 2 mg by mouth in the evening    Yes  Historical Provider, MD   ibuprofen (ADVIL,MOTRIN) 800 MG tablet  Take 800 mg by mouth every 8 (eight) hours as needed for moderate pain.    Yes  Historical Provider, MD   metoprolol (TOPROL-XL) 50 MG 24 hr tablet  Take 50 mg by mouth daily.    Yes  Historical Provider, MD   vitamin B-12 (CYANOCOBALAMIN) 50 MCG tablet  Take 50 mcg by mouth daily.    Yes  Historical Provider, MD   esomeprazole (NEXIUM) 40 MG capsule  Take 40 mg by mouth daily as needed (acid reflux).     Historical Provider, MD   nitroGLYCERIN (NITROSTAT) 0.4 MG SL tablet  Place 0.4 mg under the tongue every 5 (five) minutes as needed for chest pain.     Historical Provider, MD   Physical Exam:  Filed Vitals:    03/18/14 0656  03/18/14 0715  03/18/14 0954   BP:  174/71  176/70  170/61   Pulse:  67  58  51   Temp:  98 F (36.7 C)     TempSrc:  Oral     Resp:  14  15  18    Height:  5' 7.5" (1.715 m)     Weight:  77.111 kg (170 lb)     SpO2:  100%  100%  100%    General exam: Moderately built and nourished pleasant elderly female patient, lying comfortably supine on the gurney in no obvious distress.  Head, eyes and ENT: Nontraumatic and normocephalic. Pupils equally reacting to light and accommodation. Oral mucosa moist.  Neck: Supple. No JVD, carotid bruit or thyromegaly.  Lymphatics: No lymphadenopathy.  Respiratory system: Clear to auscultation. No increased work of breathing.  Cardiovascular system: S1 and S2 heard, RRR. No JVD, murmurs, gallops, clicks or pedal edema. Telemetry: Sinus rhythm.  Gastrointestinal system: Abdomen is nondistended, soft and nontender. Normal bowel sounds heard. No organomegaly or masses appreciated. Midline laparotomy scar.  Central nervous system: Alert and oriented. Mild paresthesia left side of  the face. No other cranial deficits.  Extremities: Symmetric 5 x 5 power. Left foot is cool to palpate up to the midshin. Unable to appreciate dorsalis pedis, posterior tibial and popliteal pulses on the left. Diminished distal  pulses on right. Left great toe appears mildly dusky with small superficial wound at the nailbed without active drainage. Exquisitely tender to touch. No erythema or crepitus.  Skin: No rashes or acute findings.  Musculoskeletal system: Negative exam.  Psychiatry: Pleasant and cooperative. Labs on Admission:  Basic Metabolic Panel:   Recent Labs  Lab  03/13/14 0942  03/18/14 0732  03/18/14 0744   NA  139  139  137   K  4.6  4.4  4.2   CL  103  102  104   CO2  --  22  --   GLUCOSE  153*  232*  238*   BUN  19  13  13    CREATININE  1.10  0.82  0.90   CALCIUM  --  9.6  --    Liver Function Tests:   Recent Labs  Lab  03/18/14 0732   AST  10   ALT  8   ALKPHOS  68   BILITOT  0.3   PROT  7.7   ALBUMIN  3.3*    No results found for this basename: LIPASE, AMYLASE, in the last 168 hours  No results found for this basename: AMMONIA, in the last 168 hours  CBC:   Recent Labs  Lab  03/13/14 0942  03/18/14 0732  03/18/14 0744   WBC  --  6.7  --   NEUTROABS  --  3.8  --   HGB  13.9  12.5  13.9   HCT  41.0  37.5  41.0   MCV  --  89.1  --   PLT  --  168  --    Cardiac Enzymes:  No results found for this basename: CKTOTAL, CKMB, CKMBINDEX, TROPONINI, in the last 168 hours  BNP (last 3 results)  No results found for this basename: PROBNP, in the last 8760 hours  CBG:   Recent Labs  Lab  03/13/14 1153   GLUCAP  114*    Radiological Exams on Admission:  Ct Head Wo Contrast  03/18/2014 CLINICAL DATA: Left face numbness EXAM: CT HEAD WITHOUT CONTRAST TECHNIQUE: Contiguous axial images were obtained from the base of the skull through the vertex without intravenous contrast. COMPARISON: 04/05/2012 FINDINGS: Ventricle size is normal. Cerebral volume is  normal for age. Mild microvascular ischemic changes in the white matter, stable. Benign-appearing calcifications in the left cerebellum stable Negative for acute infarct. Negative for hemorrhage or mass. Calvarium intact. IMPRESSION: Mild chronic microvascular ischemic change. No acute abnormality. Electronically Signed By: Franchot Gallo M.D. On: 03/18/2014 08:09  Dg Foot Complete Left  03/18/2014 CLINICAL DATA: Injury of the left foot 2 months ago with increasing pain centered in the great toe with some redness and swelling; history of diabetes ; initial visit EXAM: LEFT FOOT - COMPLETE 3+ VIEW COMPARISON: None. FINDINGS: The bones of the left foot are osteopenic. There is no evidence of an acute or healing fracture. There is periosteal reaction cloaking the third and fourth metatarsals which may reflect the sequelae of previous trauma. There are mild interphalangeal joint degenerative changes. The metatarsophalangeal joints are unremarkable. There are mild degenerative changes of the tarsometatarsal joints and of the intertarsal joints. There is a mild hallux valgus contour of the first ray. IMPRESSION: There is no evidence of an acute or healing fracture nor acute inflammation. There are no soft tissue gas collections. Given the patient's progressive symptoms as well as history of diabetes and remote injury, MRI may be useful. Electronically Signed By:  David Martinique On: 03/18/2014 07:57   EKG: Independently reviewed. sinus rhythm at 62 beats per minute with nonspecific T wave abnormalities & no acute findings.  Assessment/Plan  Principal Problem:  TIA (transient ischemic attack)  Active Problems:  PAD (peripheral artery disease)  Atherosclerosis of native arteries of extremity with intermittent claudication  Numbness  DM (diabetes mellitus) type II uncontrolled, periph vascular disorder   1. Left-sided facial numbness/possible TIA: Stroke risk factors: DM 2, HTN, HLD, PAD and ongoing tobacco abuse.  Patient on aspirin 81 mg daily PTA. Not candidate for TPA secondary to rapidly improving symptoms. Admit to telemetry for observation. Complete stroke workup-MRI/MRA brain, 2-D echo, carotid Dopplers, A1c and fasting lipids. PT evaluation. Aspirin 325 mg daily for secondary stroke prevention. Tobacco cessation counseled. 2. Left great toe pain/ischemic left great toe/PAD: Patient has history of chronic intermittent claudication of bilateral lower extremities. Recently evaluated outpatient by vascular surgery and underwent aortogram and is planned for left femoral popliteal bypass pending cardiology clearance. She has appointment for cardiac stress test Monday of next week. No overt features of infection but gives history of mild purulent/bloody discharge last week. Will get MRI of left foot to further evaluate for infection. No features to suggest acute arterial insufficiency. Pain management. Tobacco cessation counseled. 3. 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 Place on SSI. Consider maintenance Lantus at low dose while inpatient depending on CBGs. 4. Hypertension: Uncontrolled. Allow for permissive hypertension the context of problem #1. For now continue metoprolol and amlodipine. 5. Tobacco abuse: Cessation counseled. 6. History of right subclavian artery aneurysm: Recent CTA chest on 10/14 showed stable appearance of a 15 mm aneurysm associated with focal dissection at the base of the aberrant right subclavian artery. Also noted was extensive atherosclerotic changes of the thoracic aorta including a penetrating ulcer but stable, emphysema and COPD. Will discuss with vascular surgery.  7. H/o B/l carotid artery disease: Check carotid Dopplers.  8. History of hyperlipidemia: Check fasting lipids. 9. S/p AAA repair.  Code Status: Full  Family Communication: None at bedside  Disposition Plan: home when medically stable  Time spent: 60 minutes   Delainie Chavana,  MD, FACP, FHM.  [facility]  Pager [phone]  If 7PM-7AM, please contact night-coverage  [email or web address]  Password [facility]  03/18/2014, 10:10 AM   Addendum  After patient had been interviewed and examined by this M.D. and admission paperwork was completed, she discussed with ED staff and decided to leave AMA to address some pressing home issues. Discussed with ED PA.  Vernell Leep, MD, FACP, FHM. [facility] Pager [phone]  If 7PM-7AM, please contact night-coverage [email or web address] Password [email or web address] 03/18/2014, 10:31 AM

## 2014-03-18 NOTE — ED Provider Notes (Signed)
Patient seen/examined in the Emergency Department in conjunction with Midlevel Provider [facility] Patient reports chronic left foot pain but also left facial numbness/weakness since last night Exam : awake/alert, reports sensory deficit to left face Plan: stroke workup and probable admit  tPA in stroke considered but not given due to:  Onset over 3-4.5hours     Sharyon Cable, MD 03/18/14 [phone]

## 2014-03-18 NOTE — ED Notes (Signed)
Patient transported to X-ray 

## 2014-03-18 NOTE — ED Provider Notes (Signed)
Medical screening examination/treatment/procedure(s) were conducted as a shared visit with non-physician practitioner(s) and myself.  I personally evaluated the patient during the encounter.   EKG Interpretation   Date/Time:  Wednesday March 18 2014 07:07:24 EDT Ventricular Rate:  62 PR Interval:  154 QRS Duration: 89 QT Interval:  422 QTC Calculation: 428 R Axis:   62 Text Interpretation:  Sinus rhythm Nonspecific T wave abnormality No  significant change since last tracing Confirmed by Christy Gentles  MD, [place]  [phone]) on 03/18/2014 7:14:16 AM        Sharyon Cable, MD 03/18/14 1348

## 2014-03-18 NOTE — ED Notes (Signed)
Pt insisting on leaving AMA. Reports having issues at home with daughter and grandkids, needs to leave and take care of grandkids. Admitting  md and EDP aware.

## 2014-03-18 NOTE — ED Provider Notes (Signed)
CSN: [id number]     Arrival date & time 03/18/14  3818 History   First MD Initiated Contact with Patient 03/18/14 [phone]     Chief Complaint  Patient presents with  . Foot Pain     (Consider location/radiation/quality/duration/timing/severity/associated sxs/prior Treatment) HPI Comments: Patient with history of DM, HL, HTN, TIA, Stroke, AAA, and PVD presents to the ED with a chief complaint of left facial numbness.  She states that the numbness started last night and has persisted until now.  Last normal last night. She states that the numbness has affected her "whole left side." She also complains of left foot pain which has been ongoing for the past several weeks.  Dr. Oneida Alar from vascular surgery is planning on a below knee bypass graft, but the patient states that she needed to have additional cardiac stress testing done prior to surgery.  She states that the foot pain is worsened with palpation and movement.  She denies associated fevers, chills, chest pain, SOB, abdominal pain, nausea, vomiting, diarrhea.  The history is provided by the patient. No language interpreter was used.    Past Medical History  Diagnosis Date  . Peripheral vascular disease   . Diabetes mellitus   . Hyperlipidemia   . Hypertension   . TIA (transient ischemic attack)   . Arthritis     osteoarthritis  . Aneurysm   . Stroke   . AAA (abdominal aortic aneurysm)    Past Surgical History  Procedure Laterality Date  . Joint replacement      left knee  . Uterine tumor    . Ventral hernia repair    . Abdominal aortic aneurysm repair    . Abdominal hysterectomy      partial  . Tubal ligation    . Back surgery    . Abdominal aortic aneurysm repair     Family History  Problem Relation Age of Onset  . Diabetes Mother   . Hypertension Father   . Hyperlipidemia Sister   . Hypertension Sister   . Aneurysm Sister   . Hyperlipidemia Brother   . Hypertension Brother    History  Substance Use Topics  .  Smoking status: Current Every Day Smoker -- 1.00 packs/day    Types: Cigarettes  . Smokeless tobacco: Never Used  . Alcohol Use: No   OB History   Grav Para Term Preterm Abortions TAB SAB Ect Mult Living                 Review of Systems  Constitutional: Negative for fever and chills.  Respiratory: Negative for shortness of breath.   Cardiovascular: Negative for chest pain.  Gastrointestinal: Negative for nausea, vomiting, diarrhea and constipation.  Genitourinary: Negative for dysuria.  Musculoskeletal: Positive for arthralgias.  Neurological: Positive for numbness.  All other systems reviewed and are negative.     Allergies  Codeine; Azor; Lisinopril; and Penicillins  Home Medications   Prior to Admission medications   Medication Sig Start Date End Date Taking? Authorizing Provider  amLODipine (NORVASC) 5 MG tablet Take 5 mg by mouth daily.    Historical Provider, MD  aspirin 81 MG tablet Take 81 mg by mouth daily.     Historical Provider, MD  esomeprazole (NEXIUM) 40 MG capsule Take 40 mg by mouth daily as needed (acid reflux).    Historical Provider, MD  glimepiride (AMARYL) 2 MG tablet Take 2-3 mg by mouth 2 (two) times daily. Take 3 mg by mouth in the morning and take  2 mg by mouth in the evening    Historical Provider, MD  ibuprofen (ADVIL,MOTRIN) 800 MG tablet Take 800 mg by mouth every 8 (eight) hours as needed for moderate pain.    Historical Provider, MD  metoprolol (TOPROL-XL) 50 MG 24 hr tablet Take 50 mg by mouth daily.     Historical Provider, MD  nitroGLYCERIN (NITROSTAT) 0.4 MG SL tablet Place 0.4 mg under the tongue every 5 (five) minutes as needed for chest pain.    Historical Provider, MD  vitamin B-12 (CYANOCOBALAMIN) 50 MCG tablet Take 50 mcg by mouth daily.    Historical Provider, MD   BP 174/71  Pulse 67  Temp(Src) 98 F (36.7 C) (Oral)  Resp 14  Ht 5' 7.5" (1.715 m)  Wt 170 lb (77.111 kg)  BMI 26.22 kg/m2  SpO2 100% Physical Exam  Nursing note  and vitals reviewed. Constitutional: She is oriented to person, place, and time. She appears well-developed and well-nourished.  HENT:  Head: Normocephalic and atraumatic.  Eyes: Conjunctivae and EOM are normal. Pupils are equal, round, and reactive to light.  Neck: Normal range of motion. Neck supple.  Cardiovascular: Normal rate, regular rhythm and intact distal pulses.  Exam reveals no gallop and no friction rub.   No murmur heard. Distal pulses intact with brisk cap refill  Pulmonary/Chest: Effort normal and breath sounds normal. No respiratory distress. She has no wheezes. She has no rales. She exhibits no tenderness.  Abdominal: Soft. Bowel sounds are normal. She exhibits no distension and no mass. There is no tenderness. There is no rebound and no guarding.  Musculoskeletal: Normal range of motion. She exhibits no edema and no tenderness.  Neurological: She is alert and oriented to person, place, and time.  CN 3-12 intact, sensation decreased in left cheek, speech is clear, movements are goal oriented, ROM and strength of extremities is 5/5  Left great toe and left foot tender to palpation, ROM and strength 5/5  Skin: Skin is warm and dry.  Mild erythema about the left great toe near the nail, no sign of abscess, otherwise unremarkable  Psychiatric: She has a normal mood and affect. Her behavior is normal. Judgment and thought content normal.    ED Course  Procedures (including critical care time) Results for orders placed during the hospital encounter of 03/18/14  ETHANOL      Result Value Ref Range   Alcohol, Ethyl (B) <11  0 - 11 mg/dL  PROTIME-INR      Result Value Ref Range   Prothrombin Time 13.0  11.6 - 15.2 seconds   INR 0.97  0.00 - 1.49  APTT      Result Value Ref Range   aPTT 31  24 - 37 seconds  CBC      Result Value Ref Range   WBC 6.7  4.0 - 10.5 K/uL   RBC 4.21  3.87 - 5.11 MIL/uL   Hemoglobin 12.5  12.0 - 15.0 g/dL   HCT 37.5  36.0 - 46.0 %   MCV 89.1   78.0 - 100.0 fL   MCH 29.7  26.0 - 34.0 pg   MCHC 33.3  30.0 - 36.0 g/dL   RDW 13.8  11.5 - 15.5 %   Platelets 168  150 - 400 K/uL  DIFFERENTIAL      Result Value Ref Range   Neutrophils Relative % 57  43 - 77 %   Neutro Abs 3.8  1.7 - 7.7 K/uL   Lymphocytes Relative  31  12 - 46 %   Lymphs Abs 2.1  0.7 - 4.0 K/uL   Monocytes Relative 7  3 - 12 %   Monocytes Absolute 0.5  0.1 - 1.0 K/uL   Eosinophils Relative 4  0 - 5 %   Eosinophils Absolute 0.3  0.0 - 0.7 K/uL   Basophils Relative 1  0 - 1 %   Basophils Absolute 0.0  0.0 - 0.1 K/uL  COMPREHENSIVE METABOLIC PANEL      Result Value Ref Range   Sodium 139  137 - 147 mEq/L   Potassium 4.4  3.7 - 5.3 mEq/L   Chloride 102  96 - 112 mEq/L   CO2 22  19 - 32 mEq/L   Glucose, Bld 232 (*) 70 - 99 mg/dL   BUN 13  6 - 23 mg/dL   Creatinine, Ser 0.82  0.50 - 1.10 mg/dL   Calcium 9.6  8.4 - 10.5 mg/dL   Total Protein 7.7  6.0 - 8.3 g/dL   Albumin 3.3 (*) 3.5 - 5.2 g/dL   AST 10  0 - 37 U/L   ALT 8  0 - 35 U/L   Alkaline Phosphatase 68  39 - 117 U/L   Total Bilirubin 0.3  0.3 - 1.2 mg/dL   GFR calc non Af Amer 69 (*) >90 mL/min   GFR calc Af Amer 80 (*) >90 mL/min   Anion gap 15  5 - 15  I-STAT CHEM 8, ED      Result Value Ref Range   Sodium 137  137 - 147 mEq/L   Potassium 4.2  3.7 - 5.3 mEq/L   Chloride 104  96 - 112 mEq/L   BUN 13  6 - 23 mg/dL   Creatinine, Ser 0.90  0.50 - 1.10 mg/dL   Glucose, Bld 238 (*) 70 - 99 mg/dL   Calcium, Ion 1.18  1.13 - 1.30 mmol/L   TCO2 23  0 - 100 mmol/L   Hemoglobin 13.9  12.0 - 15.0 g/dL   HCT 41.0  36.0 - 46.0 %  I-STAT TROPOININ, ED      Result Value Ref Range   Troponin i, poc 0.00  0.00 - 0.08 ng/mL   Comment 3            Ct Head Wo Contrast  03/18/2014   CLINICAL DATA:  Left face numbness  EXAM: CT HEAD WITHOUT CONTRAST  TECHNIQUE: Contiguous axial images were obtained from the base of the skull through the vertex without intravenous contrast.  COMPARISON:  04/05/2012  FINDINGS:  Ventricle size is normal. Cerebral volume is normal for age. Mild microvascular ischemic changes in the white matter, stable. Benign-appearing calcifications in the left cerebellum stable  Negative for acute infarct.  Negative for hemorrhage or mass.  Calvarium intact.  IMPRESSION: Mild chronic microvascular ischemic change.  No acute abnormality.   Electronically Signed   By: Franchot Gallo M.D.   On: 03/18/2014 08:09   Dg Foot Complete Left  03/18/2014   CLINICAL DATA:  Injury of the left foot 2 months ago with increasing pain centered in the great toe with some redness and swelling; history of diabetes ; initial visit  EXAM: LEFT FOOT - COMPLETE 3+ VIEW  COMPARISON:  None.  FINDINGS: The bones of the left foot are osteopenic. There is no evidence of an acute or healing fracture. There is periosteal reaction cloaking the third and fourth metatarsals which may reflect the sequelae of previous trauma. There  are mild interphalangeal joint degenerative changes. The metatarsophalangeal joints are unremarkable. There are mild degenerative changes of the tarsometatarsal joints and of the intertarsal joints. There is a mild hallux valgus contour of the first ray.  IMPRESSION: There is no evidence of an acute or healing fracture nor acute inflammation. There are no soft tissue gas collections. Given the patient's progressive symptoms as well as history of diabetes and remote injury, MRI may be useful.   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 Preoperative evaluation for vascular intervention.  EXAM: CT ANGIOGRAPHY CHEST WITH CONTRAST  TECHNIQUE: Multidetector CT imaging of the chest was performed using the standard protocol during bolus administration of intravenous contrast. Multiplanar CT image reconstructions and MIPs were obtained to evaluate the vascular anatomy.   CONTRAST:  9mL OMNIPAQUE IOHEXOL 350 MG/ML SOLN  COMPARISON:  CTA chest 10/01/2012.  FINDINGS: The heart size is normal. Coronary artery calcifications are present. There is no significant pleural or pericardial effusion. No significant mediastinal are axillary adenopathy is present. The soft this is within normal limits. There is some nodularity of the liver suggesting the possibility of developing cirrhosis.  Extensive mural atherosclerotic changes are again seen. A also tip plaque in the proximal descending thoracic aorta is stable.  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 The lesion measures 15 mm in maximal dimension.  The lung windows demonstrate stable appearance of centrilobular and paraseptal emphysematous changes. Mild bronchial wall thickening is again noted.  The bone windows demonstrate mild rightward curvature of the thoracic spine. Mild endplate degenerative changes are present without focal lytic or blastic lesion.  A 3D rendering was created with subtraction of the carotid and right subclavian arteries.  Review of the MIP images confirms the above findings.  IMPRESSION: 1. 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 2. 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 3. Emphysema. 4. Coronary artery disease.   Electronically Signed   By: Lawrence Santiago M.D.   On: 03/11/2014 11:53     Imaging Review No results found.   EKG Interpretation   Date/Time:  Wednesday March 18 2014 07:07:24 EDT Ventricular Rate:  62 PR Interval:  154 QRS Duration: 89 QT Interval:  422 QTC Calculation: 428 R Axis:   62 Text Interpretation:  Sinus rhythm Nonspecific T wave abnormality No  significant change since last tracing Confirmed by [place]  [phone]) on 03/18/2014 7:14:16 AM       MDM   Final diagnoses:  Numbness    Patient with facial numbness that started last night.  Hx of stroke.  Will get stroke order set.  Also complains of left foot pain.  This is chronic and likely secondary to claudication.  Patient is currently seeing Dr. Oneida Alar from vascular, who is planning on upcoming left lower extremity bypass graft.  Patient seen by and discussed with Dr. Christy Gentles, who recommends admission for stroke rule out.  I discussed the patient with Dr. Aram Beecham, who recommends admission.  Will admit to medicine.  9:37 AM Discussed the patient with Dr. Algis Liming, who will admit the patient.   Montine Circle, PA-C 03/18/14 [place],  PA-C 03/18/14 0937   10:20 AM   Patient states that she has an emergency at home that she has to take care of.  She states that she will return after her emergency is resolved.   Patients that they would like to leave.  I have discussed my concerns with the patient about them leaving without completing the evaluation.    Patient presents with left facial numbness  Symptoms include: left facial numbness  Concern for: new stroke  Study limitations and other tests offered include: Admission and stroke rule-out  Treatment and recommended follow-up include: Return ASAP, return if symptoms worsen  I do not feel that the patient should leave prior to completing their workup. I have discussed the above symptoms, initial findings, study limitations, and treatment plan with the patient. Patient is not altered, does not have any distracting issues, and has capacity to make decisions for themselves. Patient places themselves at risk of TIA, stroke, worsening symptoms, disability, morbidity and/or death.   Montine Circle, PA-C 03/18/14 1022

## 2014-03-19 ENCOUNTER — Telehealth: Payer: Self-pay

## 2014-03-19 NOTE — Telephone Encounter (Signed)
Phone call from pt.  Reported she is having such pain in her left leg from just above the knee down to the toes.  Stated she can't sleep at night.  C/o (L) leg feeling "more heavy." Is scheduled for a nuclear stress test on 10/26, for cardiac clearance prior to scheduling left leg bypass.  Discussed w/ Dr. Oneida Alar.  Per Dr. Oneida Alar, he will not prescribe pain medication at this time.  Notified pt. that Dr. Oneida Alar will not write Rx for pain medication at this time.  Verb. Understanding.

## 2014-03-23 ENCOUNTER — Other Ambulatory Visit: Payer: Self-pay

## 2014-03-23 ENCOUNTER — Encounter (HOSPITAL_COMMUNITY)
Admission: RE | Admit: 2014-03-23 | Discharge: 2014-03-23 | Disposition: A | Discharge status: Home / Self Care | Payer: Medicare HMO | Source: Ambulatory Visit | Attending: Cardiology | Admitting: Cardiology

## 2014-03-23 ENCOUNTER — Ambulatory Visit (HOSPITAL_COMMUNITY)
Admission: RE | Admit: 2014-03-23 | Discharge: 2014-03-23 | Disposition: A | Discharge status: Home / Self Care | Payer: Medicare HMO | Source: Ambulatory Visit | Attending: Cardiology | Admitting: Cardiology

## 2014-03-23 DIAGNOSIS — I739 Peripheral vascular disease, unspecified: Secondary | ICD-10-CM | POA: Diagnosis not present

## 2014-03-23 DIAGNOSIS — E785 Hyperlipidemia, unspecified: Secondary | ICD-10-CM | POA: Diagnosis not present

## 2014-03-23 DIAGNOSIS — R071 Chest pain on breathing: Secondary | ICD-10-CM

## 2014-03-23 DIAGNOSIS — E119 Type 2 diabetes mellitus without complications: Secondary | ICD-10-CM | POA: Diagnosis not present

## 2014-03-23 DIAGNOSIS — I10 Essential (primary) hypertension: Secondary | ICD-10-CM | POA: Insufficient documentation

## 2014-03-23 DIAGNOSIS — R079 Chest pain, unspecified: Secondary | ICD-10-CM | POA: Diagnosis present

## 2014-03-23 MED ORDER — REGADENOSON 0.4 MG/5ML IV SOLN
0.4000 mg | Freq: Once | INTRAVENOUS | Status: AC
  Administered 2014-03-23: 0.4 mg via INTRAVENOUS

## 2014-03-23 MED ORDER — TECHNETIUM TC 99M SESTAMIBI - CARDIOLITE
10.0000 | Freq: Once | INTRAVENOUS | Status: AC | PRN
  Administered 2014-03-23: 10:00:00 10 via INTRAVENOUS

## 2014-03-23 MED ORDER — REGADENOSON 0.4 MG/5ML IV SOLN
INTRAVENOUS | Status: AC
  Filled 2014-03-23: qty 5

## 2014-03-25 ENCOUNTER — Other Ambulatory Visit: Payer: Self-pay | Admitting: Nurse Practitioner

## 2014-04-03 ENCOUNTER — Other Ambulatory Visit: Payer: Self-pay

## 2014-04-12 ENCOUNTER — Inpatient Hospital Stay (HOSPITAL_COMMUNITY): Payer: Medicare HMO

## 2014-04-12 ENCOUNTER — Inpatient Hospital Stay (HOSPITAL_COMMUNITY)
Admission: EM | Admit: 2014-04-12 | Discharge: 2014-04-15 | DRG: 246 | Disposition: A | Discharge status: Home / Self Care | Payer: Medicare HMO | Source: Other Acute Inpatient Hospital | Attending: Cardiology | Admitting: Cardiology

## 2014-04-12 ENCOUNTER — Encounter (HOSPITAL_COMMUNITY)
Admission: EM | Disposition: A | Discharge status: Home / Self Care | Payer: Commercial Managed Care - HMO | Source: Other Acute Inpatient Hospital | Attending: Cardiology

## 2014-04-12 ENCOUNTER — Encounter (HOSPITAL_COMMUNITY): Payer: Self-pay | Admitting: *Deleted

## 2014-04-12 DIAGNOSIS — I2119 ST elevation (STEMI) myocardial infarction involving other coronary artery of inferior wall: Secondary | ICD-10-CM | POA: Diagnosis present

## 2014-04-12 DIAGNOSIS — D62 Acute posthemorrhagic anemia: Secondary | ICD-10-CM | POA: Diagnosis not present

## 2014-04-12 DIAGNOSIS — Z86718 Personal history of other venous thrombosis and embolism: Secondary | ICD-10-CM | POA: Diagnosis not present

## 2014-04-12 DIAGNOSIS — Z885 Allergy status to narcotic agent status: Secondary | ICD-10-CM | POA: Diagnosis not present

## 2014-04-12 DIAGNOSIS — Z888 Allergy status to other drugs, medicaments and biological substances status: Secondary | ICD-10-CM | POA: Diagnosis not present

## 2014-04-12 DIAGNOSIS — Z8249 Family history of ischemic heart disease and other diseases of the circulatory system: Secondary | ICD-10-CM | POA: Diagnosis not present

## 2014-04-12 DIAGNOSIS — Z96652 Presence of left artificial knee joint: Secondary | ICD-10-CM | POA: Diagnosis present

## 2014-04-12 DIAGNOSIS — Z7982 Long term (current) use of aspirin: Secondary | ICD-10-CM

## 2014-04-12 DIAGNOSIS — Z880 Allergy status to penicillin: Secondary | ICD-10-CM | POA: Diagnosis not present

## 2014-04-12 DIAGNOSIS — M1990 Unspecified osteoarthritis, unspecified site: Secondary | ICD-10-CM | POA: Diagnosis present

## 2014-04-12 DIAGNOSIS — Z8673 Personal history of transient ischemic attack (TIA), and cerebral infarction without residual deficits: Secondary | ICD-10-CM

## 2014-04-12 DIAGNOSIS — E785 Hyperlipidemia, unspecified: Secondary | ICD-10-CM | POA: Diagnosis present

## 2014-04-12 DIAGNOSIS — R079 Chest pain, unspecified: Secondary | ICD-10-CM | POA: Diagnosis present

## 2014-04-12 DIAGNOSIS — Z833 Family history of diabetes mellitus: Secondary | ICD-10-CM | POA: Diagnosis not present

## 2014-04-12 DIAGNOSIS — F17210 Nicotine dependence, cigarettes, uncomplicated: Secondary | ICD-10-CM | POA: Diagnosis present

## 2014-04-12 DIAGNOSIS — I739 Peripheral vascular disease, unspecified: Secondary | ICD-10-CM | POA: Diagnosis present

## 2014-04-12 DIAGNOSIS — R570 Cardiogenic shock: Secondary | ICD-10-CM | POA: Diagnosis present

## 2014-04-12 DIAGNOSIS — D500 Iron deficiency anemia secondary to blood loss (chronic): Secondary | ICD-10-CM | POA: Diagnosis present

## 2014-04-12 DIAGNOSIS — I2510 Atherosclerotic heart disease of native coronary artery without angina pectoris: Secondary | ICD-10-CM | POA: Diagnosis present

## 2014-04-12 DIAGNOSIS — I10 Essential (primary) hypertension: Secondary | ICD-10-CM | POA: Diagnosis present

## 2014-04-12 DIAGNOSIS — E119 Type 2 diabetes mellitus without complications: Secondary | ICD-10-CM | POA: Diagnosis present

## 2014-04-12 DIAGNOSIS — I213 ST elevation (STEMI) myocardial infarction of unspecified site: Secondary | ICD-10-CM

## 2014-04-12 DIAGNOSIS — Z955 Presence of coronary angioplasty implant and graft: Secondary | ICD-10-CM

## 2014-04-12 DIAGNOSIS — I2582 Chronic total occlusion of coronary artery: Secondary | ICD-10-CM | POA: Diagnosis present

## 2014-04-12 DIAGNOSIS — I252 Old myocardial infarction: Secondary | ICD-10-CM | POA: Diagnosis not present

## 2014-04-12 HISTORY — PX: LEFT HEART CATHETERIZATION WITH CORONARY ANGIOGRAM: SHX5451

## 2014-04-12 HISTORY — PX: PERCUTANEOUS CORONARY STENT INTERVENTION (PCI-S): SHX5485

## 2014-04-12 LAB — APTT: APTT: 50 s — AB (ref 24–37)

## 2014-04-12 LAB — GLUCOSE, CAPILLARY: Glucose-Capillary: 272 mg/dL — ABNORMAL HIGH (ref 70–99)

## 2014-04-12 LAB — COMPREHENSIVE METABOLIC PANEL
ALT: 5 U/L (ref 0–35)
AST: 10 U/L (ref 0–37)
Albumin: 2.8 g/dL — ABNORMAL LOW (ref 3.5–5.2)
Alkaline Phosphatase: 50 U/L (ref 39–117)
Anion gap: 15 (ref 5–15)
BUN: 15 mg/dL (ref 6–23)
CALCIUM: 8.4 mg/dL (ref 8.4–10.5)
CO2: 21 meq/L (ref 19–32)
Chloride: 98 mEq/L (ref 96–112)
Creatinine, Ser: 0.96 mg/dL (ref 0.50–1.10)
GFR calc Af Amer: 66 mL/min — ABNORMAL LOW (ref >90)
GFR calc non Af Amer: 57 mL/min — ABNORMAL LOW (ref >90)
Glucose, Bld: 236 mg/dL — ABNORMAL HIGH (ref 70–99)
Potassium: 3.8 mEq/L (ref 3.7–5.3)
SODIUM: 134 meq/L — AB (ref 137–147)
TOTAL PROTEIN: 6.3 g/dL (ref 6.0–8.3)
Total Bilirubin: 0.3 mg/dL (ref 0.3–1.2)

## 2014-04-12 LAB — BASIC METABOLIC PANEL
BUN: 5 mg/dL — ABNORMAL LOW (ref 6–23)
CO2: 8 mEq/L — CL (ref 19–32)
Calcium: <4 mg/dL — CL (ref 8.4–10.5)
Creatinine, Ser: 0.3 mg/dL — ABNORMAL LOW (ref 0.50–1.10)
GFR calc Af Amer: >90 mL/min (ref >90)
GFR calc non Af Amer: >90 mL/min (ref >90)
Glucose, Bld: 70 mg/dL (ref 70–99)
Potassium: <2.2 mEq/L — CL (ref 3.7–5.3)
SODIUM: 150 meq/L — AB (ref 137–147)

## 2014-04-12 LAB — CBC WITH DIFFERENTIAL/PLATELET
Basophils Absolute: 0 10*3/uL (ref 0.0–0.1)
Basophils Relative: 0 % (ref 0–1)
Eosinophils Absolute: 0.1 10*3/uL (ref 0.0–0.7)
Eosinophils Relative: 4 % (ref 0–5)
HEMATOCRIT: 12.4 % — AB (ref 36.0–46.0)
HEMOGLOBIN: 3.9 g/dL — AB (ref 12.0–15.0)
LYMPHS PCT: 52 % — AB (ref 12–46)
Lymphs Abs: 1.4 10*3/uL (ref 0.7–4.0)
MCH: 28.5 pg (ref 26.0–34.0)
MCHC: 31.5 g/dL (ref 30.0–36.0)
MCV: 90.5 fL (ref 78.0–100.0)
MONO ABS: 0.2 10*3/uL (ref 0.1–1.0)
MONOS PCT: 8 % (ref 3–12)
NEUTROS ABS: 1 10*3/uL — AB (ref 1.7–7.7)
NEUTROS PCT: 37 % — AB (ref 43–77)
Platelets: 70 10*3/uL — ABNORMAL LOW (ref 150–400)
RBC: 1.37 MIL/uL — AB (ref 3.87–5.11)
RDW: 14.4 % (ref 11.5–15.5)
WBC: 2.8 10*3/uL — AB (ref 4.0–10.5)

## 2014-04-12 LAB — CBC
HCT: 30.2 % — ABNORMAL LOW (ref 36.0–46.0)
Hemoglobin: 10.1 g/dL — ABNORMAL LOW (ref 12.0–15.0)
MCH: 29.5 pg (ref 26.0–34.0)
MCHC: 33.4 g/dL (ref 30.0–36.0)
MCV: 88.3 fL (ref 78.0–100.0)
PLATELETS: 169 10*3/uL (ref 150–400)
RBC: 3.42 MIL/uL — AB (ref 3.87–5.11)
RDW: 14.3 % (ref 11.5–15.5)
WBC: 11.1 10*3/uL — ABNORMAL HIGH (ref 4.0–10.5)

## 2014-04-12 LAB — LIPID PANEL
Cholesterol: 159 mg/dL (ref 0–200)
HDL: 28 mg/dL — AB (ref >39)
LDL Cholesterol: 110 mg/dL — ABNORMAL HIGH (ref 0–99)
TRIGLYCERIDES: 104 mg/dL (ref <150)
Total CHOL/HDL Ratio: 5.7 RATIO
VLDL: 21 mg/dL (ref 0–40)

## 2014-04-12 LAB — TROPONIN I
TROPONIN I: 16.89 ng/mL — AB (ref <0.30)
Troponin I: <0.3 ng/mL (ref <0.30)

## 2014-04-12 LAB — MRSA PCR SCREENING: MRSA BY PCR: NEGATIVE

## 2014-04-12 LAB — PROTIME-INR
INR: 2.26 — ABNORMAL HIGH (ref 0.00–1.49)
Prothrombin Time: 25.2 seconds — ABNORMAL HIGH (ref 11.6–15.2)

## 2014-04-12 LAB — POCT ACTIVATED CLOTTING TIME
Activated Clotting Time: 163 seconds
Activated Clotting Time: 140 seconds

## 2014-04-12 LAB — POCT I-STAT TROPONIN I: TROPONIN I, POC: 0.02 ng/mL (ref 0.00–0.08)

## 2014-04-12 LAB — MAGNESIUM: MAGNESIUM: 1.9 mg/dL (ref 1.5–2.5)

## 2014-04-12 LAB — CK TOTAL AND CKMB (NOT AT ARMC)
CK, MB: 1.2 ng/mL (ref 0.3–4.0)
Relative Index: INVALID (ref 0.0–2.5)
Total CK: 51 U/L (ref 7–177)

## 2014-04-12 SURGERY — LEFT HEART CATHETERIZATION WITH CORONARY ANGIOGRAM
Anesthesia: LOCAL

## 2014-04-12 MED ORDER — LIDOCAINE HCL (PF) 1 % IJ SOLN
INTRAMUSCULAR | Status: AC
  Filled 2014-04-12: qty 30

## 2014-04-12 MED ORDER — TICAGRELOR 90 MG PO TABS
ORAL_TABLET | ORAL | Status: AC
  Filled 2014-04-12: qty 2

## 2014-04-12 MED ORDER — ONDANSETRON HCL 4 MG/2ML IJ SOLN
4.0000 mg | Freq: Four times a day (QID) | INTRAMUSCULAR | Status: DC | PRN
  Administered 2014-04-13: 4 mg via INTRAVENOUS

## 2014-04-12 MED ORDER — SODIUM CHLORIDE 0.9 % IV SOLN
1.7500 mg/kg/h | INTRAVENOUS | Status: DC
  Filled 2014-04-12: qty 250

## 2014-04-12 MED ORDER — SODIUM CHLORIDE 0.9 % IV SOLN
INTRAVENOUS | Status: AC
  Administered 2014-04-12: 1000 mL via INTRAVENOUS
  Administered 2014-04-13: 03:00:00 via INTRAVENOUS

## 2014-04-12 MED ORDER — OXYCODONE-ACETAMINOPHEN 5-325 MG PO TABS: 1.0000 | ORAL_TABLET | ORAL | Status: DC | PRN

## 2014-04-12 MED ORDER — ACETAMINOPHEN 325 MG PO TABS
650.0000 mg | ORAL_TABLET | ORAL | Status: DC | PRN
  Filled 2014-04-12: qty 2

## 2014-04-12 MED ORDER — METOPROLOL TARTRATE 12.5 MG HALF TABLET
12.5000 mg | ORAL_TABLET | Freq: Two times a day (BID) | ORAL | Status: DC
  Administered 2014-04-12 – 2014-04-15 (×6): 12.5 mg via ORAL
  Filled 2014-04-12 (×7): qty 1

## 2014-04-12 MED ORDER — FENTANYL CITRATE 0.05 MG/ML IJ SOLN: 50.0000 ug | Freq: Once | INTRAMUSCULAR | Status: DC

## 2014-04-12 MED ORDER — HEPARIN SODIUM (PORCINE) 5000 UNIT/ML IJ SOLN
INTRAMUSCULAR | Status: AC
  Filled 2014-04-12: qty 1

## 2014-04-12 MED ORDER — HEPARIN SODIUM (PORCINE) 5000 UNIT/ML IJ SOLN: 4000.0000 [IU] | Freq: Once | INTRAMUSCULAR | Status: DC

## 2014-04-12 MED ORDER — NITROGLYCERIN IN D5W 200-5 MCG/ML-% IV SOLN
INTRAVENOUS | Status: AC
  Filled 2014-04-12: qty 250

## 2014-04-12 MED ORDER — ASPIRIN 300 MG RE SUPP
300.0000 mg | RECTAL | Status: AC
  Filled 2014-04-12: qty 1

## 2014-04-12 MED ORDER — ATROPINE SULFATE 0.1 MG/ML IJ SOLN
INTRAMUSCULAR | Status: AC
  Filled 2014-04-12: qty 10

## 2014-04-12 MED ORDER — ATORVASTATIN CALCIUM 80 MG PO TABS
80.0000 mg | ORAL_TABLET | Freq: Every day | ORAL | Status: DC
  Administered 2014-04-13 – 2014-04-14 (×2): 80 mg via ORAL
  Filled 2014-04-12 (×3): qty 1

## 2014-04-12 MED ORDER — NITROGLYCERIN 0.4 MG SL SUBL: 0.4000 mg | SUBLINGUAL_TABLET | SUBLINGUAL | Status: DC | PRN

## 2014-04-12 MED ORDER — HEPARIN (PORCINE) IN NACL 2-0.9 UNIT/ML-% IJ SOLN
INTRAMUSCULAR | Status: AC
  Filled 2014-04-12: qty 1500

## 2014-04-12 MED ORDER — TIROFIBAN HCL IV 5 MG/100ML
INTRAVENOUS | Status: AC
  Filled 2014-04-12: qty 100

## 2014-04-12 MED ORDER — HEPARIN SODIUM (PORCINE) 1000 UNIT/ML IJ SOLN
INTRAMUSCULAR | Status: AC
  Filled 2014-04-12: qty 1

## 2014-04-12 MED ORDER — NITROGLYCERIN IN D5W 200-5 MCG/ML-% IV SOLN
5.0000 ug/min | INTRAVENOUS | Status: AC
  Administered 2014-04-12: 5 ug/min via INTRAVENOUS
  Administered 2014-04-13: 40 ug/min via INTRAVENOUS

## 2014-04-12 MED ORDER — ONDANSETRON HCL 4 MG/2ML IJ SOLN
4.0000 mg | Freq: Four times a day (QID) | INTRAMUSCULAR | Status: DC | PRN
  Filled 2014-04-12: qty 2

## 2014-04-12 MED ORDER — NITROGLYCERIN 1 MG/10 ML FOR IR/CATH LAB
INTRA_ARTERIAL | Status: AC
  Filled 2014-04-12: qty 10

## 2014-04-12 MED ORDER — VERAPAMIL HCL 2.5 MG/ML IV SOLN
INTRAVENOUS | Status: AC
  Filled 2014-04-12: qty 2

## 2014-04-12 MED ORDER — ASPIRIN 81 MG PO CHEW
81.0000 mg | CHEWABLE_TABLET | Freq: Every day | ORAL | Status: DC
  Administered 2014-04-13 – 2014-04-15 (×3): 81 mg via ORAL
  Filled 2014-04-12 (×3): qty 1

## 2014-04-12 MED ORDER — FENTANYL CITRATE 0.05 MG/ML IJ SOLN
INTRAMUSCULAR | Status: AC
  Filled 2014-04-12: qty 2

## 2014-04-12 MED ORDER — SODIUM CHLORIDE 0.9 % IV BOLUS (SEPSIS): 1000.0000 mL | Freq: Once | INTRAVENOUS | Status: DC

## 2014-04-12 MED ORDER — TICAGRELOR 90 MG PO TABS: 90.0000 mg | ORAL_TABLET | Freq: Two times a day (BID) | ORAL | Status: DC

## 2014-04-12 MED ORDER — INSULIN ASPART 100 UNIT/ML ~~LOC~~ SOLN
0.0000 [IU] | Freq: Three times a day (TID) | SUBCUTANEOUS | Status: DC
  Administered 2014-04-13: 3 [IU] via SUBCUTANEOUS
  Administered 2014-04-13: 2 [IU] via SUBCUTANEOUS
  Administered 2014-04-13 – 2014-04-15 (×5): 3 [IU] via SUBCUTANEOUS
  Administered 2014-04-15: 2 [IU] via SUBCUTANEOUS

## 2014-04-12 MED ORDER — MIDAZOLAM HCL 2 MG/2ML IJ SOLN
INTRAMUSCULAR | Status: AC
  Filled 2014-04-12: qty 2

## 2014-04-12 MED ORDER — ACETAMINOPHEN 325 MG PO TABS
650.0000 mg | ORAL_TABLET | ORAL | Status: DC | PRN
  Administered 2014-04-13 – 2014-04-15 (×2): 650 mg via ORAL
  Filled 2014-04-12: qty 2

## 2014-04-12 MED ORDER — TICAGRELOR 90 MG PO TABS
90.0000 mg | ORAL_TABLET | Freq: Two times a day (BID) | ORAL | Status: DC
  Administered 2014-04-12 – 2014-04-15 (×6): 90 mg via ORAL
  Filled 2014-04-12 (×7): qty 1

## 2014-04-12 MED ORDER — ASPIRIN EC 81 MG PO TBEC
81.0000 mg | DELAYED_RELEASE_TABLET | Freq: Every day | ORAL | Status: DC
  Filled 2014-04-12: qty 1

## 2014-04-12 MED ORDER — BIVALIRUDIN 250 MG IV SOLR
INTRAVENOUS | Status: AC
  Filled 2014-04-12: qty 250

## 2014-04-12 MED ORDER — PANTOPRAZOLE SODIUM 40 MG PO TBEC
40.0000 mg | DELAYED_RELEASE_TABLET | Freq: Every day | ORAL | Status: DC
  Administered 2014-04-12 – 2014-04-15 (×4): 40 mg via ORAL
  Filled 2014-04-12 (×4): qty 1

## 2014-04-12 MED ORDER — ASPIRIN 81 MG PO CHEW: 324.0000 mg | CHEWABLE_TABLET | ORAL | Status: AC

## 2014-04-12 NOTE — Progress Notes (Signed)
MD notified of increased BP. Nitro titrated per MD. Waiting for lower BP before pulling sheath. MD notified that SCD were not placed due to contraindication. MD will address VTE prophylaxis in the AM.

## 2014-04-12 NOTE — CV Procedure (Signed)
Left cardiac cath/PTCA stenting report dictated on 04/12/2014 dictation number is [id number]

## 2014-04-12 NOTE — ED Provider Notes (Signed)
CSN: [id number]     Arrival date & time 04/12/14  1510 History   First MD Initiated Contact with Patient 04/12/14 1520     No chief complaint on file.  Chief complaint chest pain. Code STEMI  (Consider location/radiation/quality/duration/timing/severity/associated sxs/prior Treatment) HPI Seen on arrival level V caveat urgency of situation patient complained of anteriorchest pain starting yesterday EMS reports she took one sublingual nitroglycerin today prior to arrival. EMS treated her with aspirin and intravenous fluids prior to coming here. Blood pressure noted to be 85 systolic in the field. Patient describes pain is anterior coming by shortness of breath made worse with deep breathing.she will not answer further questions. Past Medical History  Diagnosis Date  . Peripheral vascular disease   . Diabetes mellitus   . Hyperlipidemia   . Hypertension   . TIA (transient ischemic attack)   . Arthritis     osteoarthritis  . Aneurysm   . Stroke   . AAA (abdominal aortic aneurysm)    Past Surgical History  Procedure Laterality Date  . Joint replacement      left knee  . Uterine tumor    . Ventral hernia repair    . Abdominal aortic aneurysm repair    . Abdominal hysterectomy      partial  . Tubal ligation    . Back surgery    . Abdominal aortic aneurysm repair     Family History  Problem Relation Age of Onset  . Diabetes Mother   . Hypertension Father   . Hyperlipidemia Sister   . Hypertension Sister   . Aneurysm Sister   . Hyperlipidemia Brother   . Hypertension Brother    History  Substance Use Topics  . Smoking status: Current Every Day Smoker -- 1.00 packs/day    Types: Cigarettes  . Smokeless tobacco: Never Used  . Alcohol Use: No   OB History    No data available     Review of Systems  Unable to perform ROS Respiratory: Positive for shortness of breath.   Cardiovascular: Positive for chest pain.  unable to perform complete review of systems due to  patient's not answering questions and acuity of situation    Allergies  Codeine; Azor; Lisinopril; and Penicillins  Home Medications   Prior to Admission medications   Medication Sig Start Date End Date Taking? Authorizing Provider  amLODipine (NORVASC) 5 MG tablet Take 5 mg by mouth daily.    Historical Provider, MD  aspirin 81 MG tablet Take 81 mg by mouth daily.     Historical Provider, MD  esomeprazole (NEXIUM) 40 MG capsule Take 40 mg by mouth daily as needed (acid reflux).    Historical Provider, MD  glimepiride (AMARYL) 2 MG tablet Take 2-3 mg by mouth 2 (two) times daily. Take 3 mg by mouth in the morning and take 2 mg by mouth in the evening    Historical Provider, MD  ibuprofen (ADVIL,MOTRIN) 800 MG tablet Take 800 mg by mouth every 8 (eight) hours as needed for moderate pain.    Historical Provider, MD  metoprolol (TOPROL-XL) 50 MG 24 hr tablet Take 50 mg by mouth daily.     Historical Provider, MD  nitroGLYCERIN (NITROSTAT) 0.4 MG SL tablet Place 0.4 mg under the tongue every 5 (five) minutes as needed for chest pain.    Historical Provider, MD  vitamin B-12 (CYANOCOBALAMIN) 50 MCG tablet Take 50 mcg by mouth daily.    Historical Provider, MD   There were no vitals  taken for this visit. Physical Exam  Constitutional: She appears well-developed and well-nourished.  Ill-appearing  HENT:  Head: Normocephalic and atraumatic.  Eyes: Conjunctivae are normal. Pupils are equal, round, and reactive to light.  Neck: Neck supple. No tracheal deviation present. No thyromegaly present.  Cardiovascular: Normal rate.   No murmur heard. irregular  Pulmonary/Chest: Effort normal and breath sounds normal.  Abdominal: Soft. Bowel sounds are normal. She exhibits no distension. There is no tenderness.  obese  Musculoskeletal: Normal range of motion. She exhibits no edema or tenderness.  Neurological: She is alert. Coordination normal.  Skin: Skin is warm and dry. No rash noted.   Psychiatric: She has a normal mood and affect.  Nursing note and vitals reviewed.   ED Course  Procedures (including critical care time) Labs Review Labs Reviewed  CBC WITH DIFFERENTIAL  I-STAT CHEM 8, ED    Imaging Review No results found.   EKG Interpretation   Date/Time:  Sunday April 12 2014 15:15:52 EST Ventricular Rate:  62 PR Interval:  198 QRS Duration: 97 QT Interval:  483 QTC Calculation: 490 R Axis:   74 Text Interpretation:  Sinus rhythm Probable LVH with secondary repol abnrm  Inferior infarct, acute (RCA) Probable RV involvement, suggest recording  right precordial leads New since previous tracing Confirmed by Jaivyn Gulla   MD, Swathi Dauphin [phone]) on 04/12/2014 3:25:38 PM     3:28 PM patient continues to complain of severe chest tightness. Results for orders placed or performed during the hospital encounter of 03/18/14  Ethanol  Result Value Ref Range   Alcohol, Ethyl (B) <11 0 - 11 mg/dL  Protime-INR  Result Value Ref Range   Prothrombin Time 13.0 11.6 - 15.2 seconds   INR 0.97 0.00 - 1.49  APTT  Result Value Ref Range   aPTT 31 24 - 37 seconds  CBC  Result Value Ref Range   WBC 6.7 4.0 - 10.5 K/uL   RBC 4.21 3.87 - 5.11 MIL/uL   Hemoglobin 12.5 12.0 - 15.0 g/dL   HCT 37.5 36.0 - 46.0 %   MCV 89.1 78.0 - 100.0 fL   MCH 29.7 26.0 - 34.0 pg   MCHC 33.3 30.0 - 36.0 g/dL   RDW 13.8 11.5 - 15.5 %   Platelets 168 150 - 400 K/uL  Differential  Result Value Ref Range   Neutrophils Relative % 57 43 - 77 %   Neutro Abs 3.8 1.7 - 7.7 K/uL   Lymphocytes Relative 31 12 - 46 %   Lymphs Abs 2.1 0.7 - 4.0 K/uL   Monocytes Relative 7 3 - 12 %   Monocytes Absolute 0.5 0.1 - 1.0 K/uL   Eosinophils Relative 4 0 - 5 %   Eosinophils Absolute 0.3 0.0 - 0.7 K/uL   Basophils Relative 1 0 - 1 %   Basophils Absolute 0.0 0.0 - 0.1 K/uL  Comprehensive metabolic panel  Result Value Ref Range   Sodium 139 137 - 147 mEq/L   Potassium 4.4 3.7 - 5.3 mEq/L   Chloride 102  96 - 112 mEq/L   CO2 22 19 - 32 mEq/L   Glucose, Bld 232 (H) 70 - 99 mg/dL   BUN 13 6 - 23 mg/dL   Creatinine, Ser 0.82 0.50 - 1.10 mg/dL   Calcium 9.6 8.4 - 10.5 mg/dL   Total Protein 7.7 6.0 - 8.3 g/dL   Albumin 3.3 (L) 3.5 - 5.2 g/dL   AST 10 0 - 37 U/L   ALT 8  0 - 35 U/L   Alkaline Phosphatase 68 39 - 117 U/L   Total Bilirubin 0.3 0.3 - 1.2 mg/dL   GFR calc non Af Amer 69 (L) >90 mL/min   GFR calc Af Amer 80 (L) >90 mL/min   Anion gap 15 5 - 15  Urine Drug Screen  Result Value Ref Range   Opiates NONE DETECTED NONE DETECTED   Cocaine NONE DETECTED NONE DETECTED   Benzodiazepines NONE DETECTED NONE DETECTED   Amphetamines NONE DETECTED NONE DETECTED   Tetrahydrocannabinol NONE DETECTED NONE DETECTED   Barbiturates NONE DETECTED NONE DETECTED  Urinalysis, Routine w reflex microscopic  Result Value Ref Range   Color, Urine YELLOW YELLOW   APPearance CLEAR CLEAR   Specific Gravity, Urine 1.006 1.005 - 1.030   pH 5.5 5.0 - 8.0   Glucose, UA 100 (A) NEGATIVE mg/dL   Hgb urine dipstick NEGATIVE NEGATIVE   Bilirubin Urine NEGATIVE NEGATIVE   Ketones, ur NEGATIVE NEGATIVE mg/dL   Protein, ur NEGATIVE NEGATIVE mg/dL   Urobilinogen, UA 0.2 0.0 - 1.0 mg/dL   Nitrite NEGATIVE NEGATIVE   Leukocytes, UA TRACE (A) NEGATIVE  Urine microscopic-add on  Result Value Ref Range   Squamous Epithelial / LPF RARE RARE   WBC, UA 0-2 <3 WBC/hpf   RBC / HPF 0-2 <3 RBC/hpf  I-Stat Chem 8, ED  Result Value Ref Range   Sodium 137 137 - 147 mEq/L   Potassium 4.2 3.7 - 5.3 mEq/L   Chloride 104 96 - 112 mEq/L   BUN 13 6 - 23 mg/dL   Creatinine, Ser 0.90 0.50 - 1.10 mg/dL   Glucose, Bld 238 (H) 70 - 99 mg/dL   Calcium, Ion 1.18 1.13 - 1.30 mmol/L   TCO2 23 0 - 100 mmol/L   Hemoglobin 13.9 12.0 - 15.0 g/dL   HCT 41.0 36.0 - 46.0 %  I-Stat Troponin, ED (not at [facility])  Result Value Ref Range   Troponin i, poc 0.00 0.00 - 0.08 ng/mL   Comment 3           Ct Head Wo Contrast  03/18/2014    CLINICAL DATA:  Left face numbness  EXAM: CT HEAD WITHOUT CONTRAST  TECHNIQUE: Contiguous axial images were obtained from the base of the skull through the vertex without intravenous contrast.  COMPARISON:  04/05/2012  FINDINGS: Ventricle size is normal. Cerebral volume is normal for age. Mild microvascular ischemic changes in the white matter, stable. Benign-appearing calcifications in the left cerebellum stable  Negative for acute infarct.  Negative for hemorrhage or mass.  Calvarium intact.  IMPRESSION: Mild chronic microvascular ischemic change.  No acute abnormality.   Electronically Signed   By: Franchot Gallo M.D.   On: 03/18/2014 08:09   Nm Myocar Multi W/spect W/wall Motion / Ef  03/23/2014   CLINICAL DATA:  Chest pain. Diabetes, hypertension, hyperlipidemia, peripheral vascular disease.  EXAM: MYOCARDIAL IMAGING WITH SPECT (REST AND PHARMACOLOGIC-STRESS)  GATED LEFT VENTRICULAR WALL MOTION STUDY  LEFT VENTRICULAR EJECTION FRACTION  TECHNIQUE: Standard myocardial SPECT imaging was performed after resting intravenous injection of 10 mCi Tc-80m sestamibi. Subsequently, intravenous infusion of Lexiscan was performed under the supervision of the Cardiology staff. At peak effect of the drug, 30 mCi Tc-37m sestamibi was injected intravenously and standard myocardial SPECT imaging was performed. Quantitative gated imaging was also performed to evaluate left ventricular wall motion, and estimate left ventricular ejection fraction.  COMPARISON:  02/05/2009  FINDINGS: Perfusion: No decreased activity in the left ventricle  on stress imaging to suggest reversible ischemia or infarction. Mild breast attenuation of the anterior wall.  Wall Motion: Normal left ventricular wall motion. No left ventricular dilation.  Left Ventricular Ejection Fraction: 52 %  End diastolic volume 78 ml  End systolic volume 38 ml  IMPRESSION: 1. No reversible ischemia or infarction.  2. Normal left ventricular wall motion.  3. Left  ventricular ejection fraction 52%  4. Low-risk stress test findings*.  *2012 Appropriate Use Criteria for Coronary Revascularization Focused Update: J Am Coll Cardiol. 9528;41(3):244-010. http://content.airportbarriers.com.aspx?articleid=1201161   Electronically Signed   By: Kalman Jewels M.D.   On: 03/23/2014 16:29   Dg Foot Complete Left  03/18/2014   CLINICAL DATA:  Injury of the left foot 2 months ago with increasing pain centered in the great toe with some redness and swelling; history of diabetes ; initial visit  EXAM: LEFT FOOT - COMPLETE 3+ VIEW  COMPARISON:  None.  FINDINGS: The bones of the left foot are osteopenic. There is no evidence of an acute or healing fracture. There is periosteal reaction cloaking the third and fourth metatarsals which may reflect the sequelae of previous trauma. There are mild interphalangeal joint degenerative changes. The metatarsophalangeal joints are unremarkable. There are mild degenerative changes of the tarsometatarsal joints and of the intertarsal joints. There is a mild hallux valgus contour of the first ray.  IMPRESSION: There is no evidence of an acute or healing fracture nor acute inflammation. There are no soft tissue gas collections. Given the patient's progressive symptoms as well as history of diabetes and remote injury, MRI may be useful.   Electronically Signed   By: David  Martinique   On: 03/18/2014 07:57    MDM  Code STEMI called in prehospital setting and continued by me. Saline 1 L intravenously as bolus ordered by me as well as heparin 4000 unit intravenous bolus. Patient was transferred to cardiac catheterization laboratoryat 3:30 PM. Diagnosis #1acute inferior wallST segment elevation myocardial infarction #2cardiogenic shock Final diagnoses:  STEMI (ST elevation myocardial infarction)  CRITICAL CARE Performed by: Orlie Dakin Total critical care time: 30 minute Critical care time was exclusive of separately billable procedures and  treating other patients. Critical care was necessary to treat or prevent imminent or life-threatening deterioration. Critical care was time spent personally by me on the following activities: development of treatment plan with patient and/or surrogate as well as nursing, discussions with consultants, evaluation of patient's response to treatment, examination of patient, obtaining history from patient or surrogate, ordering and performing treatments and interventions, ordering and review of laboratory studies, ordering and review of radiographic studies, pulse oximetry and re-evaluation of patient's condition.      Orlie Dakin, MD 04/12/14 1537

## 2014-04-12 NOTE — Progress Notes (Signed)
[place] Progress Note Patient Name: Neriah Brott DOB: 02/19/1941 MRN: [id number]   Date of Service  04/12/2014  HPI/Events of Note  elink admission note  eICU Interventions  Reviewed history, pt stable        GIDDINGS, OLIVIA K. 04/12/2014, 6:47 PM

## 2014-04-12 NOTE — H&P (Signed)
[name] is an 73 y.o. female.   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, degenerative joint disease, tobacco abuse, actively smokes, history of DVT in the past, came to the ER by EMS complaining off retrosternal fullness grade 8/10 associated with nausea radiating to neck and mild shortness of breath off and on since yesterday patient did not seek any medical attention yesterday but as the pain got worse today to 1 sublingual nitroglycerin without relief so called EMS and came to the ED EKG done in the ED showed normal sinus rhythm with ST elevation in lead 23 aVF and V4 to V6 and ST depression in lead 1 and aVL suggestive of acute inferolateral injury pattern.patient was initially seen by Dr. Martinique as "STEMI was called and then I was called as patient is seen me recently in the office and follows with me. Dr. Martinique started the cardiac catheterization and I took over her for care. Patient was scheduled for femoropopliteal bypass next few weeks and had Lexiscan  Myoview on October 26 which showed no evidence of ischemia with EF of 52%.  Past Medical History  Diagnosis Date  . Peripheral vascular disease   . Diabetes mellitus   . Hyperlipidemia   . Hypertension   . TIA (transient ischemic attack)   . Arthritis     osteoarthritis  . Aneurysm   . Stroke   . AAA (abdominal aortic aneurysm)     Past Surgical History  Procedure Laterality Date  . Joint replacement      left knee  . Uterine tumor    . Ventral hernia repair    . Abdominal aortic aneurysm repair    . Abdominal hysterectomy      partial  . Tubal ligation    . Back surgery    . Abdominal aortic aneurysm repair      Family History  Problem Relation Age of Onset  .  Diabetes Mother   . Hypertension Father   . Hyperlipidemia Sister   . Hypertension Sister   . Aneurysm Sister   . Hyperlipidemia Brother   . Hypertension Brother    Social History:  reports that she has been smoking Cigarettes.  She has been smoking about 1.00 pack per day. She has never used smokeless tobacco. She reports that she does not drink alcohol or use illicit drugs.  Allergies:  Allergies  Allergen Reactions  . Codeine Other (See Comments)    Knocked her out of equilibrium. Made her world flip upside down  . Azor [Amlodipine-Olmesartan] Swelling, Palpitations and Rash  . Lisinopril Cough  . Penicillins Other (See Comments)    Makes exzcema worse    Medications Prior to Admission  Medication Sig Dispense Refill  . amLODipine (NORVASC) 5 MG tablet Take 5 mg by mouth daily.    Marland Kitchen aspirin 81 MG tablet Take 81 mg by mouth daily.     Marland Kitchen esomeprazole (NEXIUM) 40 MG capsule Take 40 mg by mouth daily as needed (acid reflux).    Marland Kitchen glimepiride (AMARYL) 2 MG tablet Take 2-3 mg by mouth 2 (two) times daily. Take 3 mg by mouth in the morning and take 2 mg by mouth in the evening    . ibuprofen (ADVIL,MOTRIN) 800 MG tablet Take 800 mg by mouth every 8 (eight) hours  as needed for moderate pain.    . metoprolol (TOPROL-XL) 50 MG 24 hr tablet Take 50 mg by mouth daily.     . nitroGLYCERIN (NITROSTAT) 0.4 MG SL tablet Place 0.4 mg under the tongue every 5 (five) minutes as needed for chest pain.    . vitamin B-12 (CYANOCOBALAMIN) 50 MCG tablet Take 50 mcg by mouth daily.      Results for orders placed or performed during the hospital encounter of 04/12/14 (from the past 48 hour(s))  CBC with Differential     Status: Abnormal   Collection Time: 04/12/14  3:21 PM  Result Value Ref Range   WBC 2.8 (L) 4.0 - 10.5 K/uL   RBC 1.37 (L) 3.87 - 5.11 MIL/uL   Hemoglobin 3.9 (LL) 12.0 - 15.0 g/dL    Comment: REPEATED TO VERIFY CRITICAL RESULT CALLED TO, READ BACK BY AND VERIFIED  WITH: T.O'LEARY,RN 1601 04/12/14 M.CAMPBELL    HCT 12.4 (L) 36.0 - 46.0 %   MCV 90.5 78.0 - 100.0 fL   MCH 28.5 26.0 - 34.0 pg   MCHC 31.5 30.0 - 36.0 g/dL   RDW 14.4 11.5 - 15.5 %   Platelets 70 (L) 150 - 400 K/uL    Comment: PLATELET COUNT CONFIRMED BY SMEAR   Neutrophils Relative % 37 (L) 43 - 77 %   Neutro Abs 1.0 (L) 1.7 - 7.7 K/uL   Lymphocytes Relative 52 (H) 12 - 46 %   Lymphs Abs 1.4 0.7 - 4.0 K/uL   Monocytes Relative 8 3 - 12 %   Monocytes Absolute 0.2 0.1 - 1.0 K/uL   Eosinophils Relative 4 0 - 5 %   Eosinophils Absolute 0.1 0.0 - 0.7 K/uL   Basophils Relative 0 0 - 1 %   Basophils Absolute 0.0 0.0 - 0.1 K/uL  Protime-INR     Status: Abnormal   Collection Time: 04/12/14  3:21 PM  Result Value Ref Range   Prothrombin Time 25.2 (H) 11.6 - 15.2 seconds   INR 2.26 (H) 0.00 - 1.49  APTT     Status: Abnormal   Collection Time: 04/12/14  3:21 PM  Result Value Ref Range   aPTT 50 (H) 24 - 37 seconds    Comment:        IF BASELINE aPTT IS ELEVATED, SUGGEST PATIENT RISK ASSESSMENT BE USED TO DETERMINE APPROPRIATE ANTICOAGULANT THERAPY.   Basic metabolic panel     Status: Abnormal   Collection Time: 04/12/14  3:21 PM  Result Value Ref Range   Sodium 150 (H) 137 - 147 mEq/L   Potassium <2.2 (LL) 3.7 - 5.3 mEq/L    Comment: REPEATED TO VERIFY CRITICAL RESULT CALLED TO, READ BACK BY AND VERIFIED WITH: BARLOW,T RN 04/12/14 1626 WOOTEN,K    Chloride >130 (HH) 96 - 112 mEq/L    Comment: REPEATED TO VERIFY CRITICAL RESULT CALLED TO, READ BACK BY AND VERIFIED WITH: BARLOW,T RN 04/12/14 1626 WOOTEN,K    CO2 8 (LL) 19 - 32 mEq/L    Comment: REPEATED TO VERIFY CRITICAL RESULT CALLED TO, READ BACK BY AND VERIFIED WITH: BARLOW,T RN 04/12/14 1626 WOOTEN,K    Glucose, Bld 70 70 - 99 mg/dL   BUN 5 (L) 6 - 23 mg/dL   Creatinine, Ser 0.30 (L) 0.50 - 1.10 mg/dL   Calcium <4.0 (LL) 8.4 - 10.5 mg/dL    Comment: REPEATED TO VERIFY CRITICAL RESULT CALLED TO, READ BACK BY AND  VERIFIED WITH: BARLOW,T RN 04/12/14 [place]  GFR calc non Af Amer >90 >90 mL/min   GFR calc Af Amer >90 >90 mL/min    Comment: (NOTE) The eGFR has been calculated using the CKD EPI equation. This calculation has not been validated in all clinical situations. eGFR's persistently <90 mL/min signify possible Chronic Kidney Disease.   POCT i-Stat troponin I     Status: None   Collection Time: 04/12/14  3:40 PM  Result Value Ref Range   Troponin i, poc 0.02 0.00 - 0.08 ng/mL   Comment 3            Comment: Due to the release kinetics of cTnI, a negative result within the first hours of the onset of symptoms does not rule out myocardial infarction with certainty. If myocardial infarction is still suspected, repeat the test at appropriate intervals.    No results found.  Review of Systems  Constitutional: Negative for fever and chills.  HENT: Negative for hearing loss.   Eyes: Negative for double vision and photophobia.  Respiratory: Positive for shortness of breath. Negative for cough, hemoptysis and sputum production.   Cardiovascular: Positive for chest pain. Negative for palpitations, orthopnea, claudication and leg swelling.  Gastrointestinal: Negative for nausea.  Genitourinary: Negative for dysuria and urgency.  Neurological: Negative for dizziness, tingling and headaches.    Blood pressure 145/121, pulse 27, resp. rate 21, weight 77 kg (169 lb 12.1 oz), SpO2 99 %. Physical Exam  Constitutional: She is oriented to person, place, and time.  HENT:  Head: Normocephalic and atraumatic.  Eyes: Conjunctivae are normal. Left eye exhibits no discharge. No scleral icterus.  Neck: Normal range of motion. Neck supple. No JVD present. No tracheal deviation present. No thyromegaly present.  Cardiovascular: Normal rate and regular rhythm.   Respiratory: Effort normal and breath sounds normal. No respiratory distress. She has no wheezes. She has no rales.  GI: Soft. Bowel  sounds are normal. She exhibits no distension. There is no tenderness.  Musculoskeletal: She exhibits no edema or tenderness.  Neurological: She is alert and oriented to person, place, and time.     Assessment/Plan Acute inferolateral wall myocardial infarction Hypertension Diabetes mellitus Peripheral vascular disease History of abdominal aortic aneurysm repair in the past History of subclavian aneurysm History of CVA History of TIA Tobacco abuse History of DVT in the past Degenerative joint disease Plan Discussed with patient back [facility] guarding emergency left cath possible PTCA stenting its risk and benefits and patient consented for PCI.  Gustavo Dispenza N 04/12/2014, 6:09 PM

## 2014-04-12 NOTE — ED Notes (Signed)
To room via EMS.  Onset last night chest pain non radiating.  Worsening today.  Upon EMS arrival pt was diaphoretic, nausea.  Denied shortness of breath, Family gave NTG x 1.  EMS gave ASA x 4 and Zofran 4mg .  EKG showed  Inferior STEMI.

## 2014-04-12 NOTE — Progress Notes (Signed)
Chaplain Note: Reported to Trauma B in response to code STEMI. Patient being worked on...no family present. Later checked on patient in Cath Lab...large family in waiting area.  Provided ministry of presence and support for family.  Dorris Fetch, Chaplain

## 2014-04-12 NOTE — Progress Notes (Signed)
Pt from CCL. Upon admission, pt log rolled onto bedpan, L groin sheath site began bleeding. Manual pressure being held.

## 2014-04-12 NOTE — Progress Notes (Signed)
Pressure dsg applied. Hemostasis achieved. Educated pt on need to keep leg still.

## 2014-04-12 NOTE — ED Notes (Signed)
Pt to cath lab.

## 2014-04-12 NOTE — Progress Notes (Signed)
Continuing to hold manual pressure. SBP 170s. Dr Terrence Dupont paged.

## 2014-04-13 LAB — HEMOGLOBIN A1C
HEMOGLOBIN A1C: 9.2 % — AB (ref <5.7)
Mean Plasma Glucose: 217 mg/dL — ABNORMAL HIGH (ref <117)

## 2014-04-13 LAB — LIPID PANEL
CHOL/HDL RATIO: 5.9 ratio
CHOLESTEROL: 160 mg/dL (ref 0–200)
HDL: 27 mg/dL — AB (ref >39)
LDL CALC: 111 mg/dL — AB (ref 0–99)
TRIGLYCERIDES: 108 mg/dL (ref <150)
VLDL: 22 mg/dL (ref 0–40)

## 2014-04-13 LAB — BASIC METABOLIC PANEL
Anion gap: 12 (ref 5–15)
BUN: 16 mg/dL (ref 6–23)
CHLORIDE: 105 meq/L (ref 96–112)
CO2: 21 mEq/L (ref 19–32)
Calcium: 8.5 mg/dL (ref 8.4–10.5)
Creatinine, Ser: 0.91 mg/dL (ref 0.50–1.10)
GFR calc non Af Amer: 61 mL/min — ABNORMAL LOW (ref >90)
GFR, EST AFRICAN AMERICAN: 70 mL/min — AB (ref >90)
Glucose, Bld: 212 mg/dL — ABNORMAL HIGH (ref 70–99)
POTASSIUM: 4.3 meq/L (ref 3.7–5.3)
SODIUM: 138 meq/L (ref 137–147)

## 2014-04-13 LAB — GLUCOSE, CAPILLARY
GLUCOSE-CAPILLARY: 212 mg/dL — AB (ref 70–99)
GLUCOSE-CAPILLARY: 215 mg/dL — AB (ref 70–99)
GLUCOSE-CAPILLARY: 185 mg/dL — AB (ref 70–99)
Glucose-Capillary: 200 mg/dL — ABNORMAL HIGH (ref 70–99)

## 2014-04-13 LAB — CBC
HCT: 29.6 % — ABNORMAL LOW (ref 36.0–46.0)
HEMOGLOBIN: 9.8 g/dL — AB (ref 12.0–15.0)
MCH: 29.3 pg (ref 26.0–34.0)
MCHC: 33.1 g/dL (ref 30.0–36.0)
MCV: 88.6 fL (ref 78.0–100.0)
Platelets: 165 10*3/uL (ref 150–400)
RBC: 3.34 MIL/uL — AB (ref 3.87–5.11)
RDW: 14.2 % (ref 11.5–15.5)
WBC: 7.3 10*3/uL (ref 4.0–10.5)

## 2014-04-13 LAB — TROPONIN I
TROPONIN I: 9.62 ng/mL — AB (ref <0.30)
Troponin I: 16.21 ng/mL (ref <0.30)

## 2014-04-13 LAB — POCT ACTIVATED CLOTTING TIME: Activated Clotting Time: 473 seconds

## 2014-04-13 MED ORDER — ISOSORBIDE MONONITRATE ER 60 MG PO TB24
60.0000 mg | ORAL_TABLET | Freq: Every day | ORAL | Status: DC
  Administered 2014-04-13 – 2014-04-15 (×3): 60 mg via ORAL
  Filled 2014-04-13 (×3): qty 1

## 2014-04-13 MED ORDER — HEART ATTACK BOUNCING BOOK
Freq: Once | Status: AC
  Administered 2014-04-13: 21:00:00
  Filled 2014-04-13: qty 1

## 2014-04-13 MED ORDER — ANGIOPLASTY BOOK
Freq: Once | Status: AC
  Administered 2014-04-13: 21:00:00
  Filled 2014-04-13: qty 1

## 2014-04-13 MED ORDER — AMLODIPINE BESYLATE 10 MG PO TABS
10.0000 mg | ORAL_TABLET | Freq: Every day | ORAL | Status: DC
  Administered 2014-04-14 – 2014-04-15 (×2): 10 mg via ORAL
  Filled 2014-04-13 (×2): qty 1

## 2014-04-13 MED ORDER — AMLODIPINE BESYLATE 5 MG PO TABS
5.0000 mg | ORAL_TABLET | Freq: Once | ORAL | Status: AC
  Administered 2014-04-13: 5 mg via ORAL
  Filled 2014-04-13: qty 1

## 2014-04-13 MED ORDER — AMLODIPINE BESYLATE 5 MG PO TABS
5.0000 mg | ORAL_TABLET | Freq: Every day | ORAL | Status: DC
Start: 2014-04-13 — End: 2014-04-13
  Administered 2014-04-13: 5 mg via ORAL
  Filled 2014-04-13: qty 1

## 2014-04-13 MED ORDER — ACTIVE PARTNERSHIP FOR HEALTH OF YOUR HEART BOOK
Freq: Once | Status: AC
  Administered 2014-04-13: 21:00:00
  Filled 2014-04-13: qty 1

## 2014-04-13 MED FILL — Sodium Chloride IV Soln 0.9%: INTRAVENOUS | Qty: 50 | Status: AC

## 2014-04-13 NOTE — Cardiovascular Report (Signed)
NAMECHRISTIA, DOMKE              ACCOUNT NO.:  [id number]  MEDICAL RECORD NO.:  [id number]  LOCATION:  2H10C                        FACILITY:  [facility]  PHYSICIAN:  Andy Moye N. Terrence Dupont, M.D. DATE OF BIRTH:  09-Jan-1941  DATE OF PROCEDURE:  04/12/2014 DATE OF DISCHARGE:                           CARDIAC CATHETERIZATION   PROCEDURES: 1. Left cardiac cath with selective left and right coronary     angiography, left ventriculography via right groin using Judkins     technique. 2. Successful percutaneous transluminal coronary angioplasty to     proximal right coronary artery using initially 2.5 x 12 mm long     Emerge balloon and then percutaneous transluminal coronary     angioplasty to proximal and mid right coronary artery using same     2.5 x 12 mm balloon and 2.75 x 20 mm long [place] Emerge balloon. 3. Successful deployment of 2.75 x 38 mm long Xience Alpine drug-     eluting stent in proximal and mid right coronary artery. 4. Successful postdilatation of the stent using 3.0 x 15 mm long [place]     Emerge balloon going up to 18 atmospheric pressure. 5. Successful percutaneous transluminal coronary angioplasty to mid     and distal junction of right coronary artery using 3.0 x 12 mm long     balloon. 6. Successful deployment of 2.75 x 12 mm long Xience Alpine drug-     eluting stent in mid and distal junction of right coronary artery     overlapping with the distal edge of the stent. 7. Successful postdilatation of the stent going up to 18 atmospheric     pressure.  INDICATION FOR THE PROCEDURE:  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  She came to the ER by EMS, complaining of retrosternal fullness, grade 8/10, associated with nausea, radiating to the neck and mild shortness of breath off and on since yesterday.  The patient did not seek any medical attention yesterday, but as the pain got worst today, took 1 sublingual nitro without relief, so called the EMS.  EKG done showed normal sinus rhythm with ST elevation in lead II, III, AVF, and V4, V5 with ST depression in lead I, aVL, suggestive of acute inferolateral wall injury pattern.  The patient was initially seen by Dr. Martinique as code STEMI was called and then I was called as the patient is actively being followed by me in office.  The patient in fact had preop Leane Call on March 23, 2014, for a femoral-popliteal bypass which showed no evidence of ischemia with EF of 52%.  The patient was emergently brought to the cath lab by Dr. Martinique and I was paged to take care of the patient.  The patient already had left cardiac catheterization done by Dr. Martinique and then I took over.  Findings showed good LV systolic function which was done at  the end of the procedure.  EF of 55% to 60%.  Left main was patent.  LAD has 60% to 70% ostial stenosis and 30% to 40% proximal and mid and distal stenosis.  Diagonal 1 was patent.  Left circumflex has 40% to 50% long, smooth mid stenosis.  OM-1 was very, very small.  OM-2 has 30% to 40% proximal and mid stenosis distally.  Left circumflex tapers down in AV groove.  OM-3 has 40% to 50% mid stenosis.  All these vessels were very tortuous.  RCA was 100% occluded beyond proximal portion and shepherd crook takeoff.  INTERVENTIONAL PROCEDURE:  Successful PTCA to proximal RCA was done using 2.5 x 12 mm long Emerge balloon for predilatation.  Angiogram showed focal dissection at the proximal site and moderate thrombus burden in proximal and mid vessel and also in PDA, and then PTCA to mid RCA was done  using 2.5 x 12 mm long and same Emerge balloon going up to 8 atmospheric pressure.  Then, attempted to deploy 2.75 x 38 mm long Xience Alpine drug-eluting stent in proximal and mid RCA without success.  Then, PTCA to mid and proximal RCA was done using 2.75 x 20 mm long [place] Emerge balloon, going up to 10 to 15 atmospheric pressure and then again attempted to deploy the same stent without success and then [facility] wire used as a buddy wire and then 2.75 x 38 mm long Xience Alpine drug-eluting stent was deployed in proximal and mid RCA.  This stent could not be tracked down further.  The stent was deployed at 11 atmospheric pressure.  The stent was post dilated using 3.0 x 15 mm long [place] Emerge balloon going up to 18 atmospheric pressure.  Angiogram showed haziness at the distal edge of the stent and then PTCA to mid and distal junction of RCA was done using 3.0 x 12 mm long Emerge balloon at 6 atmospheric pressure.  Multiple inflations were done.  Angiogram showed distal edge dissection beyond the distal edge of the proximal stent and then 2.75 x 12 mm long Xience Alpine drug-eluting stent was deployed in mid and distal junction of the RCA, overlapping the distal edge of the proximal stent at 10 atmospheric pressure.  The stent was post dilated using same balloon going up to 18 atmospheric pressure.  Lesions dilated from 100% to 0% residual with excellent TIMI grade 3 distal flow without evidence of further dissection or distal embolization.  The patient received weight-based Angiomax, 180 mg of Brilinta, and was started on Aggrastat during the procedure.  The patient tolerated procedure well. There were no complications.  The patient is not oozing from left sheath site which was switched to 7-French sheath at the end of the procedure. The patient tolerated procedure well.  There were no complications.  The patient was transferred to CCU in stable condition.     Allegra Lai. Terrence Dupont,  M.D.     MNH/MEDQ  D:  04/12/2014  T:  04/12/2014  Job:  [id number]

## 2014-04-13 NOTE — Progress Notes (Signed)
RN to wean pt off nitro per Dr. Terrence Dupont. Unable to do so at this time due to elevated BP. MD paged. Will continue to monitor.

## 2014-04-13 NOTE — Progress Notes (Signed)
Pt complained of chest fullness/indigestion. Dr Terrence Dupont present. Gave zofran with no relief. EKG showed no changes. Will continue to monitor.

## 2014-04-13 NOTE — Plan of Care (Signed)
Problem: Phase I Progression Outcomes Goal: Anginal pain relieved Outcome: Completed/Met Date Met:  04/13/14 Goal: Aspirin unless contraindicated Outcome: Completed/Met Date Met:  04/13/14 Goal: Voiding-avoid urinary catheter unless indicated Outcome: Completed/Met Date Met:  04/13/14

## 2014-04-13 NOTE — Progress Notes (Signed)
Left femoral sheath pulled at 2333. Firm pressure held for 30 minutes. Hematoma formed to the right of the insertion site but decreased with massage by second nurse during period of firm pressure. Some bruising noted to the right of insertion sight. Patient tolerated removal well with some slight discomfort in hip area while firm pressure was applied to insertion site. After 30 minutes of pressure, bleeding had stopped and a pressure dressing was applied. I educated patient on post sheath removal instructions and patient verbally acknowledged understanding of instructions. Patient stayed supine without flexion of knees or hips for 4 hours. Patient has ambulated and tolerated well. Will continue to monitor.

## 2014-04-13 NOTE — Progress Notes (Signed)
Subjective:  Patient complains of vague pleuritic chest pain localized no anginal pain and overall feels better.  Cardiac enzymes are trending down.  Objective:  Vital Signs in the last 24 hours: Temp:  [97.8 F (36.6 C)-98.4 F (36.9 C)] 98.4 F (36.9 C) (11/16 0700) Pulse Rate:  [27-76] 64 (11/16 0700) Resp:  [10-22] 14 (11/16 0700) BP: (124-185)/(43-121) 160/60 mmHg (11/16 0700) SpO2:  [92 %-100 %] 100 % (11/16 0700) Arterial Line BP: (148-156)/(71-75) 148/72 mmHg (11/15 2330) Weight:  [77 kg (169 lb 12.1 oz)-80 kg (176 lb 5.9 oz)] 80 kg (176 lb 5.9 oz) (11/15 1800)  Intake/Output from previous day: 11/15 0701 - 11/16 0700 In: 2010.2 [I.V.:2010.2] Out: 1475 [Urine:1475] Intake/Output from this shift: Total I/O In: 200 [P.O.:200] Out: -   Physical Exam: Neck: no adenopathy, no carotid bruit, no JVD and supple, symmetrical, trachea midline Lungs: clear to auscultation bilaterally Heart: regular rate and rhythm, S1, S2 normal and soft systolic murmur noted Abdomen: soft, non-tender; bowel sounds normal; no masses,  no organomegaly Extremities: extremities normal, atraumatic, no cyanosis or edema and both dressing dry in left and right groin.  No hematoma  Lab Results:  Recent Labs  04/12/14 1600 04/13/14 0720  WBC 11.1* 7.3  HGB 10.1* 9.8*  PLT 169 165    Recent Labs  04/12/14 1600 04/13/14 0720  NA 134* 138  K 3.8 4.3  CL 98 105  CO2 21 21  GLUCOSE 236* 212*  BUN 15 16  CREATININE 0.96 0.91    Recent Labs  04/13/14 0018 04/13/14 0720  TROPONINI 16.21* 9.62*   Hepatic Function Panel  Recent Labs  04/12/14 1600  PROT 6.3  ALBUMIN 2.8*  AST 10  ALT 5  ALKPHOS 50  BILITOT 0.3    Recent Labs  04/13/14 0018  CHOL 160   No results for input(s): PROTIME in the last 72 hours.  Imaging: Imaging results have been reviewed and Dg Chest Port 1 View  04/12/2014   CLINICAL DATA:  MI  EXAM: PORTABLE CHEST - 1 VIEW  COMPARISON:  03/11/2014   FINDINGS: Cardiac shadow is within normal limits. Interstitial changes are noted bilaterally. No focal infiltrate is noted. No sizable effusion is seen.  IMPRESSION: Mild interstitial changes likely of a chronic nature. No acute abnormality is noted.   Electronically Signed   By: Inez Catalina M.D.   On: 04/12/2014 19:14    Cardiac Studies:  Assessment/Plan:  Acute inferolateral wall myocardial infarction status post PCI to 100% occluded RCA with excellent results uncontrolledHypertension Atypical chest pain Diabetes mellitus Peripheral vascular disease History of abdominal aortic aneurysm repair in the past History of subclavian aneurysm History of CVA History of TIA Tobacco abuse History of DVT in the past Degenerative joint disease Acute on chronic anemia secondary to blood loss during the procedure rule out GI loss Plan As per CHECK labs in a.m.  LOS: 1 day    [name] N 04/13/2014, 9:06 AM

## 2014-04-13 NOTE — Plan of Care (Signed)
Problem: Phase I Progression Outcomes Goal: Aspirin unless contraindicated Outcome: Completed/Met Date Met:  04/13/14 Goal: Voiding-avoid urinary catheter unless indicated Outcome: Completed/Met Date Met:  04/13/14

## 2014-04-13 NOTE — Plan of Care (Signed)
Problem: Phase I Progression Outcomes Goal: Anginal pain relieved Outcome: Completed/Met Date Met:  04/13/14 Goal: Aspirin unless contraindicated Outcome: Completed/Met Date Met:  04/13/14 Goal: MD aware of Cardiac Marker results Outcome: Completed/Met Date Met:  04/13/14 Goal: Voiding-avoid urinary catheter unless indicated Outcome: Completed/Met Date Met:  04/13/14

## 2014-04-13 NOTE — Care Management Note (Addendum)
    Page 1 of 2   04/15/2014     3:23:12 PM CARE MANAGEMENT NOTE 04/15/2014  Patient:  [name], [name]   Account Number:  [id number]  Date Initiated:  04/13/2014  Documentation initiated by:  Elissa Hefty  Subjective/Objective Assessment:   adm w mi     Action/Plan:   lives alone, pcp dr [name]   Anticipated DC Date:  04/15/2014   Anticipated DC Plan:  [place]  CM consult  Medication Assistance      [facility] Choice  HOME HEALTH   Choice offered to / List presented to:  C-1 Patient        [place] arranged  HH-1 RN      [place].   Status of service:   Medicare Important Message given?  YES (If response is "NO", the following Medicare IM given date fields will be blank) Date Medicare IM given:  04/15/2014 Medicare IM given by:  Elissa Hefty Date Additional Medicare IM given:   Additional Medicare IM given by:    Discharge Disposition:  [place]  Per UR Regulation:  Reviewed for med. necessity/level of care/duration of stay  If discussed at Long Length of Stay Meetings, dates discussed:    Comments:  late entry: 11-18 [place] rn,bsn da vickie poole called and wanted to know if pcs serv were arranged. explained again that i could not set these services up that prim md fills out paper work and this goes thru [employer]. explained i had arranged [facility]n w [facility] for pt. da still felt pcs serv could be arranged immed and i will have asst dir of cm call da and speak w her. pt was alert and i had explained on 11-17 after talking w [place] per care that they were going to call agency that does eval and see if they could expedite eval but this would not be started right away.  11/18  1355 debbie Jaelin Devincentis rn,bsn alerted donna w [facility] homecare of dc for today.  11/17  1200n debbie Jarold Macomber rn,bsn spoke w pt and da vickie poole [phone]. da has been working w pcp and [employer] personal care  [phone] adm tymeka [phone] for per care serv. cm does not usually work w [employer]. did speak w tymeka at [facility] per care and she will ck w assessment agency [facility] that does assessments for [place] office to see if she can expedite pcs serv.  11/17 0952 debbie Dominick Zertuche rn,bsn spoke w pt. had received cm consult for [facility]. went over list of hhc agencies and she has used [facility] and would like to use them again. ref to donna w [facility] for hhrn.  11/16 1004a debbie Rosealyn Little rn,bsn gave pt 30day free brilinta card. copay wil 6.60 per month

## 2014-04-14 LAB — CBC
HCT: 25.5 % — ABNORMAL LOW (ref 36.0–46.0)
Hemoglobin: 8.4 g/dL — ABNORMAL LOW (ref 12.0–15.0)
MCH: 28.4 pg (ref 26.0–34.0)
MCHC: 32.9 g/dL (ref 30.0–36.0)
MCV: 86.1 fL (ref 78.0–100.0)
Platelets: 144 10*3/uL — ABNORMAL LOW (ref 150–400)
RBC: 2.96 MIL/uL — ABNORMAL LOW (ref 3.87–5.11)
RDW: 14.3 % (ref 11.5–15.5)
WBC: 8.2 10*3/uL (ref 4.0–10.5)

## 2014-04-14 LAB — POCT I-STAT, CHEM 8
BUN: 14 mg/dL (ref 6–23)
CREATININE: 0.9 mg/dL (ref 0.50–1.10)
Calcium, Ion: 1.19 mmol/L (ref 1.13–1.30)
Chloride: 100 mEq/L (ref 96–112)
Glucose, Bld: 236 mg/dL — ABNORMAL HIGH (ref 70–99)
HCT: 33 % — ABNORMAL LOW (ref 36.0–46.0)
Hemoglobin: 11.2 g/dL — ABNORMAL LOW (ref 12.0–15.0)
Potassium: 3.5 mEq/L — ABNORMAL LOW (ref 3.7–5.3)
SODIUM: 137 meq/L (ref 137–147)
TCO2: 21 mmol/L (ref 0–100)

## 2014-04-14 LAB — BASIC METABOLIC PANEL
Anion gap: 13 (ref 5–15)
BUN: 11 mg/dL (ref 6–23)
CALCIUM: 9 mg/dL (ref 8.4–10.5)
CO2: 23 meq/L (ref 19–32)
Chloride: 101 mEq/L (ref 96–112)
Creatinine, Ser: 0.96 mg/dL (ref 0.50–1.10)
GFR calc Af Amer: 66 mL/min — ABNORMAL LOW (ref >90)
GFR calc non Af Amer: 57 mL/min — ABNORMAL LOW (ref >90)
Glucose, Bld: 162 mg/dL — ABNORMAL HIGH (ref 70–99)
Potassium: 3.9 mEq/L (ref 3.7–5.3)
SODIUM: 137 meq/L (ref 137–147)

## 2014-04-14 LAB — GLUCOSE, CAPILLARY
Glucose-Capillary: 204 mg/dL — ABNORMAL HIGH (ref 70–99)
Glucose-Capillary: 215 mg/dL — ABNORMAL HIGH (ref 70–99)
Glucose-Capillary: 213 mg/dL — ABNORMAL HIGH (ref 70–99)
Glucose-Capillary: 185 mg/dL — ABNORMAL HIGH (ref 70–99)

## 2014-04-14 LAB — TROPONIN I: TROPONIN I: 4.74 ng/mL — AB (ref <0.30)

## 2014-04-14 MED ORDER — GLIMEPIRIDE 2 MG PO TABS
2.0000 mg | ORAL_TABLET | Freq: Every day | ORAL | Status: DC
  Administered 2014-04-15: 2 mg via ORAL
  Filled 2014-04-14 (×2): qty 1

## 2014-04-14 MED ORDER — METFORMIN HCL ER 500 MG PO TB24
500.0000 mg | ORAL_TABLET | Freq: Every day | ORAL | Status: DC
  Filled 2014-04-14 (×2): qty 1

## 2014-04-14 MED ORDER — BISACODYL 5 MG PO TBEC
5.0000 mg | DELAYED_RELEASE_TABLET | Freq: Every day | ORAL | Status: DC | PRN
  Administered 2014-04-15: 5 mg via ORAL
  Filled 2014-04-14: qty 1

## 2014-04-14 MED ORDER — FERROUS SULFATE 325 (65 FE) MG PO TABS
325.0000 mg | ORAL_TABLET | Freq: Three times a day (TID) | ORAL | Status: DC
  Administered 2014-04-14 – 2014-04-15 (×4): 325 mg via ORAL
  Filled 2014-04-14 (×7): qty 1

## 2014-04-14 NOTE — Progress Notes (Signed)
Subjective:  Doing well denies any chest pain or shortness of breath. Complains of soreness in both groins overall feels good. Had significant drop in hemoglobin to 8.4 states has not had bowel movement.cardiac enzymes are trending down   Objective:  Vital Signs in the last 24 hours: Temp:  [98.4 F (36.9 C)-99.5 F (37.5 C)] 98.4 F (36.9 C) (11/17 0700) Pulse Rate:  [62-93] 76 (11/17 0700) Resp:  [7-28] 13 (11/17 0700) BP: (137-192)/(44-68) 162/54 mmHg (11/17 0700) SpO2:  [93 %-99 %] 99 % (11/17 0700)  Intake/Output from previous day: 11/16 0701 - 11/17 0700 In: 766.6 [P.O.:230; I.V.:536.6] Out: 900 [Urine:900] Intake/Output from this shift:    Physical Exam: Neck: no adenopathy, no carotid bruit, no JVD and supple, symmetrical, trachea midline Lungs: clear to auscultation bilaterally Heart: regular rate and rhythm, S1, S2 normal and soft systolic murmur noted Abdomen: soft, non-tender; bowel sounds normal; no masses,  no organomegaly Extremities: extremities normal, atraumatic, no cyanosis or edema and both groin stable  Lab Results:  Recent Labs  04/13/14 0720 04/14/14 0338  WBC 7.3 8.2  HGB 9.8* 8.4*  PLT 165 144*    Recent Labs  04/13/14 0720 04/14/14 0338  NA 138 137  K 4.3 3.9  CL 105 101  CO2 21 23  GLUCOSE 212* 162*  BUN 16 11  CREATININE 0.91 0.96    Recent Labs  04/13/14 0720 04/14/14 0338  TROPONINI 9.62* 4.74*   Hepatic Function Panel  Recent Labs  04/12/14 1600  PROT 6.3  ALBUMIN 2.8*  AST 10  ALT 5  ALKPHOS 50  BILITOT 0.3    Recent Labs  04/13/14 0018  CHOL 160   No results for input(s): PROTIME in the last 72 hours.  Imaging: Imaging results have been reviewed and Dg Chest Port 1 View  04/12/2014   CLINICAL DATA:  MI  EXAM: PORTABLE CHEST - 1 VIEW  COMPARISON:  03/11/2014  FINDINGS: Cardiac shadow is within normal limits. Interstitial changes are noted bilaterally. No focal infiltrate is noted. No sizable effusion is  seen.  IMPRESSION: Mild interstitial changes likely of a chronic nature. No acute abnormality is noted.   Electronically Signed   By: Inez Catalina M.D.   On: 04/12/2014 19:14    Cardiac Studies:  Assessment/Plan:  Acute inferolateral wall myocardial infarction status post PCI to 100% occluded RCA with excellent results uncontrolledHypertension Atypical chest pain Diabetes mellitus Peripheral vascular disease History of abdominal aortic aneurysm repair in the past History of subclavian aneurysm History of CVA History of TIA Tobacco abuse History of DVT in the past Degenerative joint disease Acute on chronic anemia secondary to blood loss during the procedure /hydration rule out GI loss Plan Continue present management Start Feosol as per orders Check stool for occult blood Check labs in a.m. Start metformin as per orders  LOS: 2 days    Bram Hottel N 04/14/2014, 8:59 AM

## 2014-04-14 NOTE — Progress Notes (Signed)
Inpatient Diabetes Program Recommendations  AACE/ADA: New Consensus Statement on Inpatient Glycemic Control (2013)  Target Ranges:  Prepandial:   less than 140 mg/dL      Peak postprandial:   less than 180 mg/dL (1-2 hours)      Critically ill patients:  140 - 180 mg/dL  Results for JANIECE, SCOVILL (MRN [id number]) as of 04/14/2014 07:56  Ref. Range 04/12/2014 21:20 04/13/2014 07:22 04/13/2014 11:19 04/13/2014 16:51 04/13/2014 23:14  Glucose-Capillary Latest Range: 70-99 mg/dL 272 (H) 200 (H) 212 (H) 215 (H) 185 (H)   Inpatient Diabetes Program Recommendations Insulin - Basal: consider adding Lantus 15 units HgbA1C: =9.2 Thank you  Raoul Pitch BSN, RN,CDE Inpatient Diabetes Coordinator [phone] (team pager)

## 2014-04-14 NOTE — Plan of Care (Signed)
Problem: Phase I Progression Outcomes Goal: Hemodynamically stable Outcome: Completed/Met Date Met:  04/14/14  Problem: Phase II Progression Outcomes Goal: Hemodynamically stable Outcome: Completed/Met Date Met:  04/14/14 Goal: Anginal pain relieved Outcome: Completed/Met Date Met:  04/14/14  Problem: Phase I Progression Outcomes Goal: Hemodynamically stable Outcome: Completed/Met Date Met:  04/14/14 Goal: Vascular site scale level 0 - I Vascular Site Scale Level 0: No bruising/bleeding/hematoma Level I (Mild): Bruising/Ecchymosis, minimal bleeding/ooozing, palpable hematoma < 3 cm Level II (Moderate): Bleeding not affecting hemodynamic parameters, pseudoaneurysm, palpable hematoma > 3 cm Level III (Severe) Bleeding which affects hemodynamic parameters or retroperitoneal hemorrhage  Outcome: Completed/Met Date Met:  04/14/14

## 2014-04-14 NOTE — Clinical Documentation Improvement (Signed)
Presents with Stemi, had left catherization with one DES inserted.   ED physician documents Cardiogenic Shock as systolic BP 85 in field  Fluid resuscitation initiated  Please clarify if you concur with Dr. Winfred Leeds and document findings in next progress note and include in discharge summary if applicable.  Thank You, Zoila Shutter ,RN Clinical Documentation Specialist:  [facility]

## 2014-04-14 NOTE — Progress Notes (Addendum)
CARDIAC REHAB PHASE I   PRE:  Rate/Rhythm: 76 SR  BP:  Supine: 168/68  Sitting:   Standing:    SaO2: 99%RA  MODE:  Ambulation: 270 ft   POST:  Rate/Rhythm: 119ST   75SR with rest  BP:  Supine:   Sitting: 228/79, 153/64 after rest  Standing:    SaO2:  1025-1132 Pt walked 270 ft with asst x 1 and holding to siderail. Uses cane at home for long distance. Pt stated hurt feet to walk without shoes. Shoes in car and son to get for next walk. Pt stated this was farther than she had been walking at home. Left leg hurting by end of walk. To chair. No CP. MI education begun with pt and son. Many questions addressed. Reviewed NTG use, MI restrictions, stent/brilinta use (has booklet), and smoking cessation. Gave pt fake cigarette and smoking cessation handouts. Discussed not smoking as she has quit in past but restarted due to stress. Son discussed with mother giving up some of the family stress. To bed after ed done. Family would like to see case manager to discuss [place]. Notified pt's RN that while daughter here she would like to see case Freight forwarder. HR and BP elevated with walk. Graylon Good, RN BSN  04/14/2014 11:27 AM

## 2014-04-14 NOTE — Plan of Care (Signed)
Problem: Phase I Progression Outcomes Goal: Hemodynamically stable Outcome: Progressing     

## 2014-04-15 ENCOUNTER — Inpatient Hospital Stay (HOSPITAL_COMMUNITY): Admission: RE | Admit: 2014-04-15 | Payer: Commercial Managed Care - HMO | Source: Ambulatory Visit

## 2014-04-15 LAB — CBC
HCT: 25.7 % — ABNORMAL LOW (ref 36.0–46.0)
HEMOGLOBIN: 8.3 g/dL — AB (ref 12.0–15.0)
MCH: 28 pg (ref 26.0–34.0)
MCHC: 32.3 g/dL (ref 30.0–36.0)
MCV: 86.8 fL (ref 78.0–100.0)
Platelets: 152 10*3/uL (ref 150–400)
RBC: 2.96 MIL/uL — AB (ref 3.87–5.11)
RDW: 14.4 % (ref 11.5–15.5)
WBC: 8.4 10*3/uL (ref 4.0–10.5)

## 2014-04-15 LAB — BASIC METABOLIC PANEL
Anion gap: 13 (ref 5–15)
BUN: 11 mg/dL (ref 6–23)
CALCIUM: 9.1 mg/dL (ref 8.4–10.5)
CO2: 22 meq/L (ref 19–32)
CREATININE: 0.99 mg/dL (ref 0.50–1.10)
Chloride: 104 mEq/L (ref 96–112)
GFR calc Af Amer: 63 mL/min — ABNORMAL LOW (ref >90)
GFR calc non Af Amer: 55 mL/min — ABNORMAL LOW (ref >90)
GLUCOSE: 149 mg/dL — AB (ref 70–99)
Potassium: 3.9 mEq/L (ref 3.7–5.3)
Sodium: 139 mEq/L (ref 137–147)

## 2014-04-15 LAB — GLUCOSE, CAPILLARY
GLUCOSE-CAPILLARY: 173 mg/dL — AB (ref 70–99)
Glucose-Capillary: 201 mg/dL — ABNORMAL HIGH (ref 70–99)

## 2014-04-15 LAB — TROPONIN I: Troponin I: 2.19 ng/mL (ref <0.30)

## 2014-04-15 MED ORDER — BISACODYL 5 MG PO TBEC: 5.0000 mg | DELAYED_RELEASE_TABLET | Freq: Every day | ORAL | Status: DC | PRN

## 2014-04-15 MED ORDER — TICAGRELOR 90 MG PO TABS: 90.0000 mg | ORAL_TABLET | Freq: Two times a day (BID) | ORAL | Status: DC

## 2014-04-15 MED ORDER — ATORVASTATIN CALCIUM 80 MG PO TABS: 80.0000 mg | ORAL_TABLET | Freq: Every day | ORAL | Status: DC

## 2014-04-15 MED ORDER — FERROUS SULFATE 325 (65 FE) MG PO TABS: 325.0000 mg | ORAL_TABLET | Freq: Three times a day (TID) | ORAL | Status: DC

## 2014-04-15 MED ORDER — ISOSORBIDE MONONITRATE ER 60 MG PO TB24: 60.0000 mg | ORAL_TABLET | Freq: Every day | ORAL | Status: DC

## 2014-04-15 NOTE — Discharge Summary (Signed)
[name], [name]              ACCOUNT NO.:  [id number]  MEDICAL RECORD NO.:  [id number]  LOCATION:  2H10C                        FACILITY:  [facility]  PHYSICIAN:  Brynna Dobos N. Terrence [name], M.D. DATE OF BIRTH:  11-15-1940  DATE OF ADMISSION:  04/12/2014 DATE OF DISCHARGE:  04/15/2014                              DISCHARGE SUMMARY   ADMITTING DIAGNOSES: 1. Acute anterolateral wall myocardial infarction. 2. Hypertension. 3. Diabetes mellitus. 4. Peripheral vascular disease. 5. 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 7. History of cerebrovascular accident. 8. History of transient ischemic attack. 9. Tobacco abuse. 10.History of deep vein thrombosis in the past. 11.Degenerative joint disease.  DISCHARGE DIAGNOSES: 1. Status post acute inferolateral wall myocardial infarction status     post percutaneous coronary intervention to 100% occluded right     coronary artery with excellent results. 2. Multivessel coronary artery disease. 3. Hypertension. 4. Diabetes mellitus. 5. Peripheral vascular disease. 6. 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 7. History of subclavian aneurysm. 8. History of cerebrovascular accident. 9. History of transient ischemic attack. 10.Tobacco abuse. 11.History of deep vein thrombosis in the past. 12.Degenerative joint disease.  DISCHARGE HOME MEDICATIONS: 1. Atorvastatin 80 mg 1 tablet daily. 2. Dulcolax 5 mg 1 tablet daily as needed. 3. Ferrous sulfate 325 mg 1 tablets q.8 hours. 4. Imdur 60 mg daily. 5. Brilinta 90 mg twice daily. 6. Amlodipine 5 mg daily. 7. Aspirin 81 mg daily. 8. Nexium 40 mg daily. 9. Glimepiride 3 mg in the morning and 2 mg in the afternoon as     before. 10.Metoprolol succinate 50 mg daily. 11.Nitrostat 0.4 mg sublingual use as directed. 12.Vitamin B12 50 mcg daily as before. 13.The patient has been advised to stop ibuprofen.  DIET:  Low salt, low  cholesterol, 1800 calories ADA diet.  The patient has been advised regarding smoking cessation.  Post PTCA stent instructions have been given.  FOLLOWUP:  Follow up with me in 1 week.  CONDITION AT DISCHARGE:  Stable.  The patient will follow up with Dr. Oneida Alar as outpatient as scheduled. The patient will require dual antiplatelet medications for at least 9-12 months.  BRIEF HISTORY AND HOSPITAL COURSE:  [name] is a 73 year old female with past medical history significant for hypertension, diabetes mellitus, hypercholesteremia, peripheral vascular disease, history of TIA, history of CVA, history of abdominal aneurysm, history of subclavian artery aneurysm in the past, degenerative joint disease, tobacco abuse actively smokes, history of DVT in the past came to the ER by EMS complaining of retrosternal chest fullness grade 8/10 associated with nausea, radiating to the neck and mild shortness of breath off and on since yesterday.  The patient did not seek any medical attention yesterday but as the pain got worse today she took 1 sublingual nitro without relief so called EMS.  EKG done in the ED showed normal sinus rhythm with ST elevation in leads 2, 3 aVF and V4-V6, and ST depression in lead 1, aVL suggestive of acute inferolateral wall injury pattern. The patient was initially seen by Dr. Martinique as STEMI was called, and then I was called as the patient was actively being followed by  me.  The patient was seen in the office approximately 3 weeks ago, and had a nuclear stress test for preop clearance for femoral popliteal bypass which showed no evidence of ischemia with EF of 52%.  PHYSICAL EXAMINATION:  GENERAL:  She was alert, awake, oriented x3, hemodynamically stable. EYES:  Conjunctivae was pink. NECK:  Supple.  No JVD.  No bruit. LUNGS:  Clear to auscultation without rhonchi or rales. CARDIOVASCULAR:  S1, S2 was normal.  There was soft systolic murmur and S4  gallop. ABDOMEN:  Soft.  Bowel sounds were present.  Nontender. EXTREMITIES:  There was no clubbing, cyanosis, or edema.  LABORATORY DATA:  Her admission labs sodium 134, potassium 3.8, BUN 15, creatinine 0.96.  Her cholesterol was 159, LDL 110, HDL was low 28, triglycerides 104.  Hemoglobin was 10.1, hematocrit 30.2, white count of 11.1.  Her troponin I was first set point of care was less than 0.30, second set was 16.89, repeat troponins were 16.21, 9.62, yesterday was 4.74, today troponin is 2.19.  Her last labs, hemoglobin 8.3, hematocrit 25.7, white count of 8.4 platelet count 152,000 which is stable since yesterday.  Her BUN is 11, creatinine 0.99.  Blood sugar is 149, potassium is 3.9.  IMAGING STUDIES:  EKG done yesterday showed normal sinus rhythm with nonspecific T-wave changes in inferior leads with resolution of ST elevations.  BRIEF HOSPITAL COURSE:  The patient underwent emergently left cardiac cath/PTCA stenting to 100% occluded RCA as per procedure report with excellent results.  Postprocedure, the patient did not had any episodes of anginal chest pain.  Her groin is stable with no evidence of hematoma or bruit.  Phase 1 cardiac rehab was called.  The patient has been ambulating in hallway without any problems.  Her blood pressure medicines have been adjusted.  The patient has been counseled extensively regarding lifestyle changes, diet, and smoking cessation to which she agrees.  The patient will be discharged home on above medications, and will be followed up in my office in 1 week.     [name], M.D.     MNH/MEDQ  D:  04/15/2014  T:  04/15/2014  Job:  [id number]

## 2014-04-15 NOTE — Progress Notes (Signed)
CARDIAC REHAB PHASE I   PRE:  Rate/Rhythm: 79 SR  BP:  Supine:   Sitting: 156/53  Standing:    SaO2: 97 RA  MODE:  Ambulation: 270 ft   POST:  Rate/Rhythm: 123 ST  BP:  Supine:   Sitting: 161/101 after rest 168/65  Standing:    SaO2: 98 RA  1130-1210 Assisted X 1 to ambulate. Gait steady. Pt walks close to handrail in hall and does frequent rest stops due to leg and back pain. Pt able to walk 270 feet without c/o of cp or SB. BP after walk 161/101. Dr Terrence Dupont here and reported to him. After rest BP 168/65. Completed discharge education with pt and son. Pt agrees to [place]. CRP in [place], will send referral. Encouraged smoking cessation, pt does not seem committed to quitting.  Rodney Langton RN 04/15/2014 12:06 PM

## 2014-04-15 NOTE — Discharge Summary (Signed)
Discharge summary dictated on 04/15/2014 dictation number is [phone]

## 2014-04-15 NOTE — Plan of Care (Signed)
Problem: Consults Goal: Chest Pain Patient Education (See Patient Education module for education specifics.)  Outcome: Completed/Met Date Met:  04/15/14 Goal: Skin Care Protocol Initiated - if Braden Score 18 or less If consults are not indicated, leave blank or document N/A  Outcome: Not Applicable Date Met:  47/82/95 Goal: Tobacco Cessation referral if indicated Outcome: Completed/Met Date Met:  04/15/14 Goal: Nutrition Consult-if indicated Outcome: Not Applicable Date Met:  62/13/08 Goal: Diabetes Guidelines if Diabetic/Glucose > 140 If diabetic or lab glucose is > 140 mg/dl - Initiate Diabetes/Hyperglycemia Guidelines & Document Interventions  Outcome: Completed/Met Date Met:  04/15/14  Problem: Phase I Progression Outcomes Goal: Other Phase I Outcomes/Goals Outcome: Completed/Met Date Met:  04/15/14  Problem: Phase II Progression Outcomes Goal: Stress Test if indicated Outcome: Not Applicable Date Met:  65/78/46 Goal: Cath/PCI Day Path if indicated Outcome: Not Applicable Date Met:  96/29/52 Goal: CV Risk Factors identified Outcome: Completed/Met Date Met:  04/15/14 Goal: Cardiac Rehab if ordered Outcome: Completed/Met Date Met:  04/15/14 Goal: If positive for MI, change to MI Path Outcome: Completed/Met Date Met:  04/15/14 Goal: Other Phase II Outcomes/Goals Outcome: Completed/Met Date Met:  04/15/14  Problem: Phase III Progression Outcomes Goal: Hemodynamically stable Outcome: Completed/Met Date Met:  04/15/14 Goal: No anginal pain Outcome: Completed/Met Date Met:  04/15/14 Goal: Cath/PCI Path as indicated Outcome: Completed/Met Date Met:  04/15/14 Goal: Vascular site scale level 0 - I Vascular Site Scale Level 0: No bruising/bleeding/hematoma Level I (Mild): Bruising/Ecchymosis, minimal bleeding/ooozing, palpable hematoma < 3 cm Level II (Moderate): Bleeding not affecting hemodynamic parameters, pseudoaneurysm, palpable hematoma > 3 cm Level III (Severe) Bleeding  which affects hemodynamic parameters or retroperitoneal hemorrhage  Outcome: Completed/Met Date Met:  04/15/14 Goal: Discharge plan remains appropriate-arrangements made Outcome: Completed/Met Date Met:  04/15/14 Goal: Tolerating diet Outcome: Completed/Met Date Met:  04/15/14 Goal: If positive for MI, change to MI Path Outcome: Completed/Met Date Met:  04/15/14 Goal: Other Phase III Outcomes/Goals Outcome: Completed/Met Date Met:  04/15/14  Problem: Discharge Progression Outcomes Goal: No anginal pain Outcome: Completed/Met Date Met:  04/15/14 Goal: Hemodynamically stable Outcome: Completed/Met Date Met:  84/13/24 Goal: Complications resolved/controlled Outcome: Completed/Met Date Met:  04/15/14 Goal: Barriers To Progression Addressed/Resolved Outcome: Completed/Met Date Met:  04/15/14 Goal: Discharge plan in place and appropriate Outcome: Completed/Met Date Met:  04/15/14 Goal: Vascular site scale level 0 - I Vascular Site Scale Level 0: No bruising/bleeding/hematoma Level I (Mild): Bruising/Ecchymosis, minimal bleeding/ooozing, palpable hematoma < 3 cm Level II (Moderate): Bleeding not affecting hemodynamic parameters, pseudoaneurysm, palpable hematoma > 3 cm Level III (Severe) Bleeding which affects hemodynamic parameters or retroperitoneal hemorrhage  Outcome: Completed/Met Date Met:  04/15/14 Goal: Tolerates diet Outcome: Completed/Met Date Met:  04/15/14 Goal: Activity appropriate for discharge plan Outcome: Completed/Met Date Met:  04/15/14 Goal: Other Discharge Outcomes/Goals Outcome: Completed/Met Date Met:  04/15/14  Problem: Consults Goal: MI Patient Education (See Patient Education module for education specifics.)  Outcome: Completed/Met Date Met:  04/15/14  Problem: Phase I Progression Outcomes Goal: Initial discharge plan identified Outcome: Completed/Met Date Met:  04/15/14 Goal: Other Phase I Outcomes/Goals Outcome: Completed/Met Date Met:   04/15/14  Problem: Phase II Progression Outcomes Goal: Anginal pain absent Outcome: Completed/Met Date Met:  04/15/14 Goal: Hemodynamically stable Outcome: Completed/Met Date Met:  04/15/14 Goal: Vascular site scale level 0 - I Vascular Site Scale Level 0: No bruising/bleeding/hematoma Level I (Mild): Bruising/Ecchymosis, minimal bleeding/ooozing, palpable hematoma < 3 cm Level II (Moderate): Bleeding not affecting hemodynamic parameters, pseudoaneurysm, palpable hematoma >  3 cm Level III (Severe) Bleeding which affects hemodynamic parameters or retroperitoneal hemorrhage  Outcome: Completed/Met Date Met:  04/15/14 Goal: Cardiac Rehab referral Outcome: Completed/Met Date Met:  04/15/14 Goal: Wean IV nitroglycerin to PO or topical Outcome: Completed/Met Date Met:  04/15/14 Goal: Discharge plan established Outcome: Completed/Met Date Met:  04/15/14 Goal: Tolerating diet Outcome: Completed/Met Date Met:  04/15/14 Goal: Other Phase II Outcomes/Goals Outcome: Completed/Met Date Met:  04/15/14  Problem: Phase III Progression Outcomes Goal: Anginal pain absent Outcome: Completed/Met Date Met:  04/15/14 Goal: Up to chair & ambulate with assist (TID) Outcome: Completed/Met Date Met:  04/15/14 Goal: VS Stable with increased activity Outcome: Completed/Met Date Met:  04/15/14 Goal: Vascular site scale level 0 - I Vascular Site Scale Level 0: No bruising/bleeding/hematoma Level I (Mild): Bruising/Ecchymosis, minimal bleeding/ooozing, palpable hematoma < 3 cm Level II (Moderate): Bleeding not affecting hemodynamic parameters, pseudoaneurysm, palpable hematoma > 3 cm Level III (Severe) Bleeding which affects hemodynamic parameters or retroperitoneal hemorrhage  Outcome: Completed/Met Date Met:  04/15/14 Goal: ACE I or ARB if EF < 40% Outcome: Completed/Met Date Met:  04/15/14 Goal: Tolerating diet Outcome: Completed/Met Date Met:  04/15/14 Goal: Discharge plan remains  appropriate-arrangements made Outcome: Completed/Met Date Met:  04/15/14 Goal: Other Phase III Outcomes/Goals Outcome: Completed/Met Date Met:  04/15/14  Problem: Discharge Progression Outcomes Goal: Barriers To Progression Addressed/Resolved Outcome: Completed/Met Date Met:  04/15/14 Goal: Discharge plan in place and appropriate Outcome: Completed/Met Date Met:  04/15/14 Goal: Anginal pain absent Outcome: Completed/Met Date Met:  04/15/14 Goal: Hemodynamically stable Outcome: Completed/Met Date Met:  46/27/03 Goal: Complications resolved/controlled Outcome: Completed/Met Date Met:  04/15/14 Goal: Tolerates diet Outcome: Completed/Met Date Met:  04/15/14 Goal: Activity plan per MD/Cardiac Rehab Outcome: Completed/Met Date Met:  04/15/14 Goal: Lipid-lowering therapy prescribed Outcome: Completed/Met Date Met:  04/15/14 Goal: Other Discharge Outcomes/Goals Outcome: Completed/Met Date Met:  04/15/14

## 2014-04-15 NOTE — Discharge Instructions (Signed)
Acute Coronary Syndrome Acute coronary syndrome (ACS) is an urgent problem in which the blood and oxygen supply to the heart is critically deficient. ACS requires hospitalization because one or more coronary arteries may be blocked. ACS represents a range of conditions including:  Previous angina that is now unstable, lasts longer, happens at rest, or is more intense.  A heart attack, with heart muscle cell injury and death. There are three vital coronary arteries that supply the heart muscle with blood and oxygen so that it can pump blood effectively. If blockages to these arteries develop, blood flow to the heart muscle is reduced. If the heart does not get enough blood, angina may occur as the first warning sign. SYMPTOMS   The most common signs of angina include:  Tightness or squeezing in the chest.  Feeling of heaviness on the chest.  Discomfort in the arms, neck, back, or jaw.  Shortness of breath and nausea.  Cold, wet skin.  Angina is usually brought on by physical effort or excitement which increase the oxygen needs of the heart. These states increase the blood flow needs of the heart beyond what can be delivered.  Other symptoms that are not as common include:  Fatigue  Unexplained feelings of nervousness or anxiety  Weakness  Diarrhea  Sometimes, you may not have noticed any symptoms at all but still suffered a cardiac injury. TREATMENT   Medicines to help discomfort may include nitroglycerin (nitro) in the form of tablets or a spray for rapid relief, or longer-acting forms such as cream, patches, or capsules. (Be aware that there are many side effects and possible interactions with other drugs).  Other medicines may be used to help the heart pump better.  Procedures to open blocked arteries including angioplasty or stent placement to keep the arteries open.  Open heart surgery may be needed when there are many blockages or they are in critical locations that  are best treated with surgery. HOME CARE INSTRUCTIONS   Do not use any tobacco products including cigarettes, chewing tobacco, or electronic cigarettes.  Take one baby or adult aspirin daily, if your health care provider advises. This helps reduce the risk of a heart attack.  It is very important that you follow the angina treatment prescribed by your health care provider. Make arrangements for proper follow-up care.  Eat a heart healthy diet with salt and fat restrictions as advised.  Regular exercise is good for you as long as it does not cause discomfort. Do not begin any new type of exercise until you check with your health care provider.  If you are overweight, you should lose weight.  Try to maintain normal blood lipid levels.  Keep your blood pressure under control as recommended by your health care provider.  You should tell your health care provider right away about any increase in the severity or frequency of your chest discomfort or angina attacks. When you have angina, you should stop what you are doing and sit down. This may bring relief in 3 to 5 minutes. If your health care provider has prescribed nitro, take it as directed.  If your health care provider has given you a follow-up appointment, it is very important to keep that appointment. Not keeping the appointment could result in a chronic or permanent injury, pain, and disability. If there is any problem keeping the appointment, you must call back to this facility for assistance. SEEK IMMEDIATE MEDICAL CARE IF:   You develop nausea, vomiting, or shortness  of breath.  You feel faint, lightheaded, or pass out.  Your chest discomfort gets worse.  You are sweating or experience sudden profound fatigue.  You do not get relief of your chest pain after 3 doses of nitro.  Your discomfort lasts longer than 15 minutes. MAKE SURE YOU:   Understand these instructions.  Will watch your condition.  Will get help right  away if you are not doing well or get worse.  Take all medicines as directed by your health care provider. Document Released: 05/15/2005 Document Revised: 05/20/2013 Document Reviewed: 09/16/2013 [facility] Patient Information 2015 [place], [place]. This information is not intended to replace advice given to you by your health care provider. Make sure you discuss any questions you have with your health care provider. Coronary Angiogram with Stent Coronary angiography with stent placement is a procedure to widen or open a narrow blood vessel of the heart (coronary artery). When a coronary artery becomes partially blocked, it decreases blood flow to that area. This may lead to chest pain or a heart attack (myocardial infarction). Arteries may become blocked by cholesterol buildup (plaque) in the lining or wall.  A stent is a small piece of metal that looks like a mesh or a spring. Stent placement may be done right after a coronary angiography in which a blocked artery is found or as a treatment for a heart attack.  LET [facility] CARE PROVIDER KNOW ABOUT:  Any allergies you have.   All medicines you are taking, including vitamins, herbs, eye drops, creams, and over-the-counter medicines.   Previous problems you or members of your family have had with the use of anesthetics.   Any blood disorders you have.   Previous surgeries you have had.   Medical conditions you have. RISKS AND COMPLICATIONS Generally, coronary angiography with stent is a safe procedure. However, problems can occur and include:  Damage to the heart or its blood vessels.   A return of blockage.   Bleeding, infection, or bruising at the insertion site.   A collection of blood under the skin (hematoma) at the insertion site.  Blood clot in another part of the body.   Kidney injury.   Allergic reaction to the dye or contrast used.   Bleeding into the abdomen (retroperitoneal bleeding). BEFORE THE  PROCEDURE  Do not eat or drink anything after midnight on the night before the procedure or as directed by your health care provider.  Ask your health care provider about changing or stopping your regular medicines. This is especially important if you are taking diabetes medicines or blood thinners.  Your health care provider will make sure you understand the procedure as well as the risks and potential problems associated with the procedure.  PROCEDURE  You may be given a medicine to help you relax before and during the procedure (sedative). This medicine will be given through an IV tube that is put into one of your veins.   The area where the catheter will be inserted will be shaved and cleaned. This is usually done in the groin but may be done in the fold of your arm (near your elbow) or in the wrist.   A medicine will be given to numb the area where the catheter will be inserted (local anesthetic).   The catheter will be inserted into an artery using a guide wire. A type of X-ray (fluoroscopy) will be used to help guide the catheter to the opening of the blocked artery.   A  dye will then be injected into the catheter, and X-rays will be taken. The dye will help to show where any narrowing or blockages are located in the heart arteries.   A tiny wire will be guided to the blocked spot, and a balloon will be inflated to make the artery wider. The stent will be expanded and will crush the plaque into the wall of the vessel. The stent will hold the area open like a scaffolding and improve the blood flow.   Sometimes the artery may be made wider using a laser or other tools to remove plaque.   When the blood flow is better, the catheter will be removed. The lining of the artery will grow over the stent, which stays where it was placed.  AFTER THE PROCEDURE  If the procedure is done through the leg, you will be kept in bed lying flat for about 6 hours. You will be instructed to not  bend or cross your legs.   The insertion site will be checked frequently.   The pulse in your feet or wrist will be checked frequently.   Additional blood tests, X-rays, and electrocardiography may be done. Document Released: 11/19/2002 Document Revised: 09/29/2013 Document Reviewed: 11/21/2012 [facility] Patient Information 2015 [place], [place]. This information is not intended to replace advice given to you by your health care provider. Make sure you discuss any questions you have with your health care provider.

## 2014-04-15 NOTE — Progress Notes (Signed)
Pt discharged to home. IV removed. Pt belongings sent with patient. Discharge teaching given. Pt verbalized understanding.

## 2014-04-27 ENCOUNTER — Encounter (HOSPITAL_COMMUNITY): Admission: RE | Payer: Self-pay | Source: Ambulatory Visit

## 2014-04-27 ENCOUNTER — Inpatient Hospital Stay (HOSPITAL_COMMUNITY): Admission: RE | Admit: 2014-04-27 | Payer: Medicare HMO | Source: Ambulatory Visit | Admitting: Vascular Surgery

## 2014-04-27 SURGERY — BYPASS GRAFT FEMORAL-POPLITEAL ARTERY
Anesthesia: General | Site: Leg Upper | Laterality: Left

## 2014-04-30 ENCOUNTER — Telehealth: Payer: Self-pay

## 2014-04-30 NOTE — Telephone Encounter (Signed)
Phone call to pt. To check on status of wound left great toe.  Stated it isn't completely healed.  Stated that part of the left great toe is "black", and there is "some drainage at times."  Stated she continues to have a lot of pain in the left foot.  Advised that Dr. Oneida Alar has requested her to schedule an appt. for f/u on the wound of the left great toe.  Advised will have a scheduler call her with an appt.  Verb. Understanding.

## 2014-05-06 ENCOUNTER — Encounter: Payer: Self-pay | Admitting: Vascular Surgery

## 2014-05-07 ENCOUNTER — Ambulatory Visit: Payer: Commercial Managed Care - HMO | Admitting: Vascular Surgery

## 2014-05-07 ENCOUNTER — Encounter (HOSPITAL_COMMUNITY): Payer: Self-pay | Admitting: Vascular Surgery

## 2014-05-13 ENCOUNTER — Encounter: Payer: Self-pay | Admitting: Vascular Surgery

## 2014-05-14 ENCOUNTER — Ambulatory Visit (INDEPENDENT_AMBULATORY_CARE_PROVIDER_SITE_OTHER): Payer: Commercial Managed Care - HMO | Admitting: Vascular Surgery

## 2014-05-14 ENCOUNTER — Encounter: Payer: Self-pay | Admitting: Vascular Surgery

## 2014-05-14 VITALS — BP 179/94 | HR 78 | Ht 67.0 in | Wt 165.8 lb

## 2014-05-14 DIAGNOSIS — I739 Peripheral vascular disease, unspecified: Secondary | ICD-10-CM

## 2014-05-14 NOTE — Addendum Note (Signed)
Addended by: Mena Goes on: 05/14/2014 12:26 PM   Modules accepted: Orders

## 2014-05-14 NOTE — Progress Notes (Signed)
[facility] [place] HISTORY AND PHYSICAL    History of Present Illness:  Patient is a 73 y.o. year old female who presents for evaluation of nonhealing wound left first toe. She states the toe became painful approximately  2 months ago.  She states she still is having some intermittent purulent drainage. She states the toe is painful. She has rest pain in the left foot at night time. She has bilateral lower extremity claudication at approximately 1/2 block. The claudication symptoms or chronic. We have followed her for several years for this.  We had her scheduled for a left femoral to below-knee popliteal bypass but she had a myocardial infarction just prior to this. This was approximately one month ago. She is currently on Brilinta and aspirin. Dr. Terrence Dupont has suggested she needs to stay on this combination for 9-12 months. She currently is not having any chest pain or shortness of breath. She does have to take Tylenol at night time for the pain in her left foot.  We have also followed her in the past for carotid artery occlusive disease. Her last duplex scan approximately 15 months ago showed less than 40% carotid stenosis bilaterally. She has also previously had abdominal aortic aneurysm repair by my partner Dr. Amedeo Plenty. 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 However, the patient refused any further intervention or followup for this at her last office visit approximately a year and a half ago. Other medical problems include diabetes, hyperlipidemia, hypertension. The patient has also had a DVT in her right leg in the past. She was Xarelto in the past. This has been stopped.      Past Medical History  Diagnosis Date  . Peripheral vascular disease   . Diabetes mellitus   . Hyperlipidemia   . Hypertension   . TIA (transient ischemic attack)   . Arthritis     osteoarthritis  . Aneurysm   . Stroke   . AAA (abdominal aortic  aneurysm)   . Myocardial infarction       Past Surgical History   Procedure  Laterality  Date   .  Joint replacement           left knee   .  Uterine tumor       .  Ventral hernia repair       .  Abdominal aortic aneurysm repair       .  Abdominal hysterectomy           partial   .  Tubal ligation       .  Back surgery       .  Abdominal aortic aneurysm repair         Social History History   Substance Use Topics   .  Smoking status:  Current Every Day Smoker -- 1.00 packs/day       Types:  Cigarettes   .  Smokeless tobacco:  Never Used   .  Alcohol Use:  No     Family History Family History   Problem  Relation  Age of Onset   .  Diabetes  Mother     .  Hypertension  Father     .  Hyperlipidemia  Sister     .  Hypertension  Sister     .  Aneurysm  Sister     .  Hyperlipidemia  Brother     .  Hypertension  Brother      Medications:  Current Outpatient Prescriptions on File Prior to Visit  Medication Sig Dispense Refill  . amLODipine (NORVASC) 5 MG tablet Take 5 mg by mouth daily.    Marland Kitchen aspirin 81 MG tablet Take 81 mg by mouth daily.     Marland Kitchen atorvastatin (LIPITOR) 80 MG tablet Take 1 tablet (80 mg total) by mouth daily at 6 PM. 30 tablet 3  . bisacodyl (DULCOLAX) 5 MG EC tablet Take 1 tablet (5 mg total) by mouth daily as needed for moderate constipation. 30 tablet 0  . esomeprazole (NEXIUM) 40 MG capsule Take 40 mg by mouth daily as needed (acid reflux).    . ferrous sulfate 325 (65 FE) MG tablet Take 1 tablet (325 mg total) by mouth 3 (three) times daily with meals. 90 tablet 3  . glimepiride (AMARYL) 2 MG tablet Take 2-3 mg by mouth 2 (two) times daily. Take 3 mg by mouth in the morning and take 2 mg by mouth in the afternoon    . isosorbide mononitrate (IMDUR) 60 MG 24 hr tablet Take 1 tablet (60 mg total) by mouth daily. 30 tablet 3  . metoprolol (TOPROL-XL) 50 MG 24 hr tablet Take 50 mg by mouth daily.     . nitroGLYCERIN (NITROSTAT) 0.4 MG SL tablet Place 0.4 mg  under the tongue every 5 (five) minutes as needed for chest pain.    . ticagrelor (BRILINTA) 90 MG TABS tablet Take 1 tablet (90 mg total) by mouth 2 (two) times daily. 60 tablet 11  . vitamin B-12 (CYANOCOBALAMIN) 50 MCG tablet Take 50 mcg by mouth daily.     No current facility-administered medications on file prior to visit.   Allergies    Allergies   Allergen  Reactions   .  Codeine  Other (See Comments)       Knocked her out of equilibrium. Made her world flip upside down   .  Lisinopril  Cough   .  Penicillins         Makes exzcema worse   .  Azor [Amlodipine-Olmesartan]  Swelling, Palpitations and Rash     ROS:    General:  No weight loss, Fever, chills  HEENT: No recent headaches, no nasal bleeding, no visual changes, no sore throat  Neurologic: No dizziness, blackouts, seizures. No recent symptoms of stroke or mini- stroke. No recent episodes of slurred speech, or temporary blindness.  Cardiac: No recent episodes of chest pain/pressure, no shortness of breath at rest.  No shortness of breath with exertion.  Denies history of atrial fibrillation or irregular heartbeat  Vascular: No history of rest pain in feet.  No history of claudication.  No history of non-healing ulcer, No history of DVT    Pulmonary: No home oxygen, no productive cough, no hemoptysis,  No asthma or wheezing  Musculoskeletal:  [x ] Arthritis, [ ]  Low back pain,  [x]  Joint pain  Hematologic:No history of hypercoagulable state.  No history of easy bleeding.  No history of anemia  Gastrointestinal: No hematochezia or melena,  No gastroesophageal reflux, no trouble swallowing  Urinary: [ ]  chronic Kidney disease, [ ]  on HD - [ ]  MWF or [ ]  TTHS, [ ]  Burning with urination, [ ]  Frequent urination, [ ]  Difficulty urinating;    Skin: No rashes  Psychological: No history of anxiety,  No history of depression   Physical Examination    Filed Vitals:   05/14/14 0852  BP: 179/94  Pulse: 78  Height:  5\' 7"  (1.702 m)  Weight: 165 lb 12.8 oz (75.206 kg)  SpO2: 99%    General:  Alert and oriented, no acute distress HEENT: Normal Neck: No bruit or JVD Pulmonary: Clear to auscultation bilaterally Cardiac: Regular Rate and Rhythm without murmur Abdomen: Soft, non-tender, non-distended, no mass Skin: No rash, ulceration on her toe has completely healed but this is still very tender to palpation Extremity Pulses:  2+ radial, brachial, femoral, absent popliteal dorsalis pedis, posterior tibial pulses bilaterally Musculoskeletal: No deformity or edema     Neurologic: Upper and lower extremity motor 5/5 and symmetric  DATA:  Patient had bilateral ABIs performed on 02/16/2014. ABI on the left was 0.47 right was 0.53 monophasic waveforms bilaterally toe pressure 57 on the right 77 on the left   ASSESSMENT:  #1 peripheral arterial disease bilaterally with lower extremity claudication which overall is fairly stable. However, the patient has a nonhealing wound now on her left foot. She may not have adequate perfusion to resolve this especially in light of her diabetes. Her bypass has been put on hold at this point due to her recent MI. Ordinarily we would right at least 6 weeks before considering her bypass. However this is further complicated by the fact that she is now on Brilinta and aspirin which would significantly increased risk of bleeding and wound problems. The patient will follow-up with me in 1 month. I will send a note to Dr. Terrence Dupont today to see whether or not there are any other possibilities of changing her antiplatelet therapy sooner than one year so that we can proceed with bypass after she has recovered from her recent cardiac event  #2 previous history of apparent left subclavian artery aneurysm. 15 mm diameter no real change  #3 carotid occlusive disease last carotid duplex exam showed less than 40% stenosis bilaterally probably needs repeat in the next 1-2 years   PLAN: See  above  Ruta Hinds, MD [facility] [place] Office: [phone] Pager: [phone]

## 2014-05-28 ENCOUNTER — Telehealth: Payer: Self-pay | Admitting: *Deleted

## 2014-05-28 MED ORDER — SULFAMETHOXAZOLE-TRIMETHOPRIM 800-160 MG PO TABS: 20.0000 | ORAL_TABLET | Freq: Two times a day (BID) | ORAL | Status: DC

## 2014-05-28 NOTE — Telephone Encounter (Signed)
Pt states her doctor is concerned that the ingrown toenail that Dr. Blenda Mounts removed is infected.  I instructed pt to soak 1/4C Epsom salt in 1 Qt warm water 2 times a day for 20 minutes after cover with Neosporin bandaid and call in an antibiotic.  I transferred pt to scheduler to be scheduled with Dr. Blenda Mounts on 06/02/2014.

## 2014-06-02 ENCOUNTER — Ambulatory Visit (INDEPENDENT_AMBULATORY_CARE_PROVIDER_SITE_OTHER): Payer: Medicare Other

## 2014-06-02 VITALS — BP 147/77 | HR 64 | Resp 12

## 2014-06-02 DIAGNOSIS — I739 Peripheral vascular disease, unspecified: Secondary | ICD-10-CM

## 2014-06-02 DIAGNOSIS — L600 Ingrowing nail: Secondary | ICD-10-CM

## 2014-06-02 DIAGNOSIS — L97524 Non-pressure chronic ulcer of other part of left foot with necrosis of bone: Secondary | ICD-10-CM

## 2014-06-02 DIAGNOSIS — E1140 Type 2 diabetes mellitus with diabetic neuropathy, unspecified: Secondary | ICD-10-CM

## 2014-06-02 DIAGNOSIS — M79676 Pain in unspecified toe(s): Secondary | ICD-10-CM

## 2014-06-02 DIAGNOSIS — S90129A Contusion of unspecified lesser toe(s) without damage to nail, initial encounter: Secondary | ICD-10-CM

## 2014-06-02 NOTE — Progress Notes (Signed)
   Subjective:    Patient ID: [name], female    DOB: [birth year], 74 y.o.   MRN: [id number]  HPI  ''LT FOOT GREAT TOENAIL IS INFECTED AND SORE.''  PT STATED HAD STENT IMPLANT PUT ON LAST November BECAUSE OF MASSIVE HEART ATTACK AND BLOCKAGE ON THE LT LEG. ALSO, TAKING BLOOD THINNER MEDICATION. Review of Systems  Musculoskeletal: Positive for gait problem.  All other systems reviewed and are negative.      Objective:   Physical Exam 74 year old afterward female presents this time with a painful ingrowing and fluctuant nailbed of the left great toe. Patient has had recent multiple TIAs is on to anticoagulant medications history of PVD and complications lower extremity arterial Doppler studies back in September indicated blockages with decreased ABIs and TBI's bilateral patient apparently has a blockage of her left which will require bypass of some point in the future. At this time patient was seen by Dr. her wanting her cardiologist who identified that her toe is painful symptomatic continues to be somewhat fluctuant. Pulses thready to nonpalpable pulses bilateral skin temperature warm to cool turgor diminished there is no edema noted the right foot is otherwise unremarkable left hallux shows fluctuance of the nail with some yellowing thickening discoloration and brittleness of the hallux nail screws is a painful tender on palpation and compression and with any shoe wear. Remainder the exam unremarkable mild digital contractures noted orthopedic biomechanical exam unremarkable rectus foot type dermal nails thick brittle Crumley friable dystrophic does have history of diabetes and complications as well as PVD and complications.       Assessment & Plan:  Assessment ingrowing nail with history of contusion of the toe and localized subungual infection possible ulceration at this time. Recommendation is for IND are partial nail excision or possible permanent nail excision at this time local  anesthetic blocks Mr. to the left great toe Betadine prep was performed the nail plate is bluntly avulsed the nailbed there is approximately 1 x 0.5 cm ulceration of the nailbed in the central portion with some bone exposure the distal phalanx noted. This is debrided and cleansed with all cleansed and cleansing. At this time the proximal nail fold is treated with 3 applications of phenol toe cauterizing prevent nail recurrence however the nailbed itself was cleansed and debrided of any necrotic tissue including some small portion of bone. The site is then dressed with Silvadene and gauze dressing. Patient is currently on Septra DS antibiotic regimen which is advised to continue to its completion. Instructions for daily Betadine or Epson salt soaks Neosporin and Band-Aid dressing changes daily starting tomorrow. Recommended Tylenol as needed for pain maintaining loosefitting shoe. A dry sterile dressing applied no significant bleeding was identified at this time no other complications were noted maintain antibiotic regimen as instructed. Contact us immediately there is any fever chills or any x-ray crease pain redness or swelling in the foot contact us or go immediately to the emergency room. Otherwise two-week follow-up for nail check. Patient was advised she is high risk for slow or nonhealing of this site due to the vascular compromise and bone exposure and ulceration of the nailbed  Harriet Masson DPM

## 2014-06-02 NOTE — Patient Instructions (Signed)
Betadine Soak Instructions  Purchase an 8 oz. bottle of BETADINE solution (Povidone)  THE DAY AFTER THE PROCEDURE  Place 1 tablespoon of betadine solution in a quart of warm tap water.  Submerge your foot or feet with outer bandage intact for the initial soak; this will allow the bandage to become moist and wet for easy lift off.  Once you remove your bandage, continue to soak in the solution for 20 minutes.  This soak should be done twice a day.  Next, remove your foot or feet from solution, blot dry the affected area and cover.  You may use a band aid large enough to cover the area or use gauze and tape.  Apply other medications to the area as directed by the doctor such as cortisporin otic solution (ear drops) or neosporin.  IF YOUR SKIN BECOMES IRRITATED WHILE USING THESE INSTRUCTIONS, IT IS OKAY TO SWITCH TO EPSOM SALTS AND WATER OR WHITE VINEGAR AND WATER.   Recommend Tylenol as needed for pain  Daily dressing changes with Neosporin or Polysporin and Band-Aid dressing as instructed

## 2014-06-09 ENCOUNTER — Ambulatory Visit (INDEPENDENT_AMBULATORY_CARE_PROVIDER_SITE_OTHER): Payer: Medicare Other

## 2014-06-09 VITALS — BP 128/71 | HR 95 | Resp 18

## 2014-06-09 DIAGNOSIS — M79676 Pain in unspecified toe(s): Secondary | ICD-10-CM

## 2014-06-09 DIAGNOSIS — L600 Ingrowing nail: Secondary | ICD-10-CM

## 2014-06-09 DIAGNOSIS — E1140 Type 2 diabetes mellitus with diabetic neuropathy, unspecified: Secondary | ICD-10-CM

## 2014-06-09 DIAGNOSIS — Z09 Encounter for follow-up examination after completed treatment for conditions other than malignant neoplasm: Secondary | ICD-10-CM

## 2014-06-09 NOTE — Progress Notes (Signed)
   Subjective:    Patient ID: [name], female    DOB: [birth year], 74 y.o.   MRN: [id number]  HPI MY TOENAIL ON MY LEFT BIG TOE IS DOING GOOD STILL DRAINING SOME AND WOULD DO BETTER WITH AN OPEN TOE SHOE    Review of Systems no new findings or systemic changes noted     Objective:   Physical Exam Lower extremity objective findings reveal pedal pulses are palpable with thready at best patient is scheduled for some bypass in the future however did have an infection which warranted the procedure advised at this time the wound is healing although slowly some pink granulomatous tissue into the nailbed is some slight fibrous tissue noted as well. L odor no purulence mild serous drainage still present. At this time Iodosorb and Band-Aid dressing is applied keep Neosporin or antibiotic ointment and a Band-Aid on during the day where dry at night continue soaking release once daily as long as there is drainage.       Assessment & Plan:  Are assessment slow but steady healing left great toe following nail procedure. Patient is subungual infection or ulceration partial nail excision and ulceration of the nailbed was noted. This ulceration possibly had some bone exposure however the majority the nailbed appears to be resolving well at least stable no malodor is noted advised to keep anabolic ointment on during the day in a Band-Aid was debrided and the evenings recheck in one month if not resolved completely contact us immediately if there is any changes or exacerbations. Patient is encouraged to follow up with her vascular doctors in the future. Next  Harriet Masson DPM

## 2014-06-09 NOTE — Patient Instructions (Signed)

## 2014-06-16 ENCOUNTER — Ambulatory Visit: Payer: Medicare Other

## 2014-06-16 ENCOUNTER — Telehealth: Payer: Self-pay | Admitting: *Deleted

## 2014-06-16 NOTE — Telephone Encounter (Addendum)
Cora request wound care instructions.  Dr. Blenda Mounts states pt is to wear an antibiotic ointment bandaid through the day and allow to air dry at night, and recheck in 1 month if unresolved.  Orders called to [phone].

## 2014-06-24 ENCOUNTER — Encounter: Payer: Self-pay | Admitting: Vascular Surgery

## 2014-06-25 ENCOUNTER — Encounter: Payer: Self-pay | Admitting: Vascular Surgery

## 2014-06-25 ENCOUNTER — Ambulatory Visit (HOSPITAL_COMMUNITY)
Admission: RE | Admit: 2014-06-25 | Discharge: 2014-06-25 | Disposition: A | Discharge status: Home / Self Care | Payer: Medicare Other | Source: Ambulatory Visit | Attending: Vascular Surgery | Admitting: Vascular Surgery

## 2014-06-25 ENCOUNTER — Ambulatory Visit (INDEPENDENT_AMBULATORY_CARE_PROVIDER_SITE_OTHER): Payer: Medicare Other | Admitting: Vascular Surgery

## 2014-06-25 VITALS — BP 179/76 | HR 62 | Ht 67.0 in | Wt 167.6 lb

## 2014-06-25 DIAGNOSIS — L98499 Non-pressure chronic ulcer of skin of other sites with unspecified severity: Secondary | ICD-10-CM

## 2014-06-25 DIAGNOSIS — Z48812 Encounter for surgical aftercare following surgery on the circulatory system: Secondary | ICD-10-CM | POA: Insufficient documentation

## 2014-06-25 DIAGNOSIS — Z720 Tobacco use: Secondary | ICD-10-CM | POA: Diagnosis not present

## 2014-06-25 DIAGNOSIS — I739 Peripheral vascular disease, unspecified: Secondary | ICD-10-CM | POA: Diagnosis not present

## 2014-06-25 DIAGNOSIS — I771 Stricture of artery: Secondary | ICD-10-CM

## 2014-06-25 DIAGNOSIS — E785 Hyperlipidemia, unspecified: Secondary | ICD-10-CM | POA: Diagnosis not present

## 2014-06-25 DIAGNOSIS — E119 Type 2 diabetes mellitus without complications: Secondary | ICD-10-CM | POA: Insufficient documentation

## 2014-06-25 DIAGNOSIS — I10 Essential (primary) hypertension: Secondary | ICD-10-CM | POA: Insufficient documentation

## 2014-06-25 NOTE — Progress Notes (Signed)
[facility] [place] HISTORY AND PHYSICAL    History of Present Illness:  Patient is a 74 y.o. year old female who presents for evaluation of nonhealing wound left first toe. She states the toe became painful approximately  3 months ago.  She states she still is having some intermittent drainage but this has improved somewhat after recent drainage of the toe by Dr. Janus Molder. She states the toe is painful. She has rest pain in the left foot at night time. She has bilateral lower extremity claudication at approximately 1/2 block. The claudication symptoms or chronic. We have followed her for several years for this.  We had her scheduled for a left femoral to below-knee popliteal bypass but she had a myocardial infarction just prior to this. This was approximately 2 months ago. She is currently on Brilinta and aspirin. Dr. Terrence Dupont has suggested she needs to stay on this combination for 9-12 months. She currently is not having any chest pain or shortness of breath. She does have to take Tylenol at night time for the pain in her left foot.  We have also followed her in the past for carotid artery occlusive disease. Her last duplex scan approximately 15 months ago showed less than 40% carotid stenosis bilaterally. She has also previously had abdominal aortic aneurysm repair by my partner Dr. Amedeo Plenty. 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 However, the patient refused any further intervention or followup for this at her last office visit approximately a year and a half ago. Other medical problems include diabetes, hyperlipidemia, hypertension. The patient has also had a DVT in her right leg in the past. She was Xarelto in the past. This has been stopped.         Past Medical History   Diagnosis  Date   .  Peripheral vascular disease     .  Diabetes mellitus     .  Hyperlipidemia     .  Hypertension     .  TIA (transient ischemic  attack)     .  Arthritis         osteoarthritis   .  Aneurysm     .  Stroke     .  AAA (abdominal aortic aneurysm)     .  Myocardial infarction         Past Surgical History    Procedure   Laterality   Date    .   Joint replacement                left knee    .   Uterine tumor          .   Ventral hernia repair          .   Abdominal aortic aneurysm repair          .   Abdominal hysterectomy                partial    .   Tubal ligation          .   Back surgery          .   Abdominal aortic aneurysm repair            Social History History    Substance Use Topics    .   Smoking status:   Current Every Day Smoker -- 1.00 packs/day  Types:   Cigarettes    .   Smokeless tobacco:   Never Used    .   Alcohol Use:   No      Family History Family History    Problem   Relation   Age of Onset    .   Diabetes   Mother       .   Hypertension   Father       .   Hyperlipidemia   Sister       .   Hypertension   Sister       .   Aneurysm   Sister       .   Hyperlipidemia   Brother       .   Hypertension   Brother        Medications:    Current Outpatient Prescriptions on File Prior to Visit   Medication  Sig  Dispense  Refill   .  amLODipine (NORVASC) 5 MG tablet  Take 5 mg by mouth daily.       Marland Kitchen  aspirin 81 MG tablet  Take 81 mg by mouth daily.        Marland Kitchen  atorvastatin (LIPITOR) 80 MG tablet  Take 1 tablet (80 mg total) by mouth daily at 6 PM.  30 tablet  3   .  bisacodyl (DULCOLAX) 5 MG EC tablet  Take 1 tablet (5 mg total) by mouth daily as needed for moderate constipation.  30 tablet  0   .  esomeprazole (NEXIUM) 40 MG capsule  Take 40 mg by mouth daily as needed (acid reflux).       .  ferrous sulfate 325 (65 FE) MG tablet  Take 1 tablet (325 mg total) by mouth 3 (three) times daily with meals.  90 tablet  3   .  glimepiride (AMARYL) 2 MG tablet  Take 2-3 mg by mouth 2 (two) times daily. Take 3 mg by mouth in the morning and take 2 mg by mouth in the afternoon        .  isosorbide mononitrate (IMDUR) 60 MG 24 hr tablet  Take 1 tablet (60 mg total) by mouth daily.  30 tablet  3   .  metoprolol (TOPROL-XL) 50 MG 24 hr tablet  Take 50 mg by mouth daily.        .  nitroGLYCERIN (NITROSTAT) 0.4 MG SL tablet  Place 0.4 mg under the tongue every 5 (five) minutes as needed for chest pain.       .  ticagrelor (BRILINTA) 90 MG TABS tablet  Take 1 tablet (90 mg total) by mouth 2 (two) times daily.  60 tablet  11   .  vitamin B-12 (CYANOCOBALAMIN) 50 MCG tablet  Take 50 mcg by mouth daily.          No current facility-administered medications on file prior to visit.    Allergies    Allergies    Allergen   Reactions    .   Codeine   Other (See Comments)          Knocked her out of equilibrium. Made her world flip upside down    .   Lisinopril   Cough    .   Penicillins             Makes exzcema worse    .   Azor [Amlodipine-Olmesartan]   Swelling, Palpitations and Rash      ROS:  General:  No weight loss, Fever, chills  HEENT: No recent headaches, no nasal bleeding, no visual changes, no sore throat  Neurologic: No dizziness, blackouts, seizures. No recent symptoms of stroke or mini- stroke. No recent episodes of slurred speech, or temporary blindness.  Cardiac: No recent episodes of chest pain/pressure, no shortness of breath at rest.  No shortness of breath with exertion.  Denies history of atrial fibrillation or irregular heartbeat  Vascular: No history of rest pain in feet.  No history of claudication.  No history of non-healing ulcer, No history of DVT    Pulmonary: No home oxygen, no productive cough, no hemoptysis,  No asthma or wheezing  Musculoskeletal:  [x ] Arthritis, [ ]  Low back pain,  [x]  Joint pain  Hematologic:No history of hypercoagulable state.  No history of easy bleeding.  No history of anemia  Gastrointestinal: No hematochezia or melena,  No gastroesophageal reflux, no trouble swallowing  Urinary: [ ]  chronic Kidney disease, [  ] on HD - [ ]  MWF or [ ]  TTHS, [ ]  Burning with urination, [ ]  Frequent urination, [ ]  Difficulty urinating;    Skin: No rashes  Psychological: No history of anxiety,  No history of depression   Physical Examination  Filed Vitals:   06/25/14 1101  BP: 179/76  Pulse: 62  Height: 5\' 7"  (1.702 m)  Weight: 167 lb 9.6 oz (76.023 kg)  SpO2: 100%   General:  Alert and oriented, no acute distress HEENT: Normal Neck: No bruit or JVD Pulmonary: Clear to auscultation bilaterally Cardiac: Regular Rate and Rhythm without murmur Abdomen: Soft, non-tender, non-distended, no mass Skin: No rash, ulceration nail bed left first toe Extremity Pulses:  2+ radial, brachial, femoral, absent popliteal dorsalis pedis, posterior tibial pulses bilaterally Musculoskeletal: No deformity or edema     Neurologic: Upper and lower extremity motor 5/5 and symmetric  DATA:  Patient had bilateral ABIs performed on 02/16/2014. ABI on the left was 0.47 right was 0.53 monophasic waveforms bilaterally toe pressure 57 on the right 77 on the left  I reviewed her arteriogram which showed she would be a candidate for a left femoral to below-knee popliteal bypass with one-vessel runoff via the posterior tibial artery   ASSESSMENT:  #1 peripheral arterial disease bilaterally with lower extremity claudication which overall is fairly stable. However, the patient has a nonhealing wound now on her left foot. She may not have adequate perfusion to resolve this especially in light of her diabetes.  However this is further complicated by the fact that she is now on Brilinta and aspirin which would significantly increased risk of bleeding and wound problems. He is okay with stopping her Brilinta perioperatively as long as she is bridged with Integrilin. We will stop her Brilinta 5 days prior to procedure.Marland Kitchen She will be admitted to the hospital and placed on Integrilin 3 days prior to her femoropopliteal procedure. Her femoropopliteal be  scheduled for February 8.  Risks benefits possible, the patient and procedure details were trying to the patient today including but not limited to perioperative cardiac events, bleeding, infection, wound problems, graft thrombosis and limb loss. She understands and agrees to proceed.   #2 previous history of apparent left subclavian artery aneurysm. 15 mm diameter no real change  #3 carotid occlusive disease last carotid duplex exam showed less than 40% stenosis bilaterally probably needs repeat in the next 1-2 years   PLAN: See above  Ruta Hinds, MD [facility] [place] Office: [phone]  Pager: [phone]

## 2014-07-03 ENCOUNTER — Inpatient Hospital Stay (HOSPITAL_COMMUNITY)
Admission: AD | Admit: 2014-07-03 | Discharge: 2014-07-08 | DRG: 254 | Disposition: A | Discharge status: Home Health | Payer: Medicare Other | Source: Ambulatory Visit | Attending: Vascular Surgery | Admitting: Vascular Surgery

## 2014-07-03 ENCOUNTER — Encounter (HOSPITAL_COMMUNITY): Payer: Self-pay | Admitting: Pharmacy Technician

## 2014-07-03 ENCOUNTER — Inpatient Hospital Stay (HOSPITAL_COMMUNITY): Payer: Medicare Other

## 2014-07-03 ENCOUNTER — Encounter (HOSPITAL_COMMUNITY): Payer: Self-pay | Admitting: *Deleted

## 2014-07-03 DIAGNOSIS — Z419 Encounter for procedure for purposes other than remedying health state, unspecified: Secondary | ICD-10-CM

## 2014-07-03 DIAGNOSIS — E875 Hyperkalemia: Secondary | ICD-10-CM | POA: Diagnosis not present

## 2014-07-03 DIAGNOSIS — I739 Peripheral vascular disease, unspecified: Secondary | ICD-10-CM | POA: Diagnosis present

## 2014-07-03 DIAGNOSIS — I70245 Atherosclerosis of native arteries of left leg with ulceration of other part of foot: Secondary | ICD-10-CM | POA: Diagnosis present

## 2014-07-03 LAB — COMPREHENSIVE METABOLIC PANEL
ALT: 9 U/L (ref 0–35)
ANION GAP: 10 (ref 5–15)
AST: 14 U/L (ref 0–37)
Albumin: 3.4 g/dL — ABNORMAL LOW (ref 3.5–5.2)
Alkaline Phosphatase: 64 U/L (ref 39–117)
BUN: 20 mg/dL (ref 6–23)
CHLORIDE: 109 mmol/L (ref 96–112)
CO2: 18 mmol/L — AB (ref 19–32)
Calcium: 9.3 mg/dL (ref 8.4–10.5)
Creatinine, Ser: 1.33 mg/dL — ABNORMAL HIGH (ref 0.50–1.10)
GFR calc non Af Amer: 38 mL/min — ABNORMAL LOW (ref >90)
GFR, EST AFRICAN AMERICAN: 44 mL/min — AB (ref >90)
Glucose, Bld: 186 mg/dL — ABNORMAL HIGH (ref 70–99)
Potassium: 4 mmol/L (ref 3.5–5.1)
Sodium: 137 mmol/L (ref 135–145)
Total Bilirubin: 0.5 mg/dL (ref 0.3–1.2)
Total Protein: 7.7 g/dL (ref 6.0–8.3)

## 2014-07-03 LAB — CBC
HCT: 31.7 % — ABNORMAL LOW (ref 36.0–46.0)
HEMOGLOBIN: 10.5 g/dL — AB (ref 12.0–15.0)
MCH: 28.3 pg (ref 26.0–34.0)
MCHC: 33.1 g/dL (ref 30.0–36.0)
MCV: 85.4 fL (ref 78.0–100.0)
Platelets: 185 10*3/uL (ref 150–400)
RBC: 3.71 MIL/uL — ABNORMAL LOW (ref 3.87–5.11)
RDW: 15.9 % — AB (ref 11.5–15.5)
WBC: 6.9 10*3/uL (ref 4.0–10.5)

## 2014-07-03 LAB — PROTIME-INR
INR: 0.96 (ref 0.00–1.49)
Prothrombin Time: 12.9 seconds (ref 11.6–15.2)

## 2014-07-03 LAB — GLUCOSE, CAPILLARY
Glucose-Capillary: 148 mg/dL — ABNORMAL HIGH (ref 70–99)
Glucose-Capillary: 191 mg/dL — ABNORMAL HIGH (ref 70–99)

## 2014-07-03 MED ORDER — ADULT MULTIVITAMIN W/MINERALS CH
1.0000 | ORAL_TABLET | Freq: Every day | ORAL | Status: DC
  Administered 2014-07-04 – 2014-07-08 (×4): 1 via ORAL
  Filled 2014-07-03 (×6): qty 1

## 2014-07-03 MED ORDER — SODIUM CHLORIDE 0.9 % IJ SOLN
3.0000 mL | Freq: Two times a day (BID) | INTRAMUSCULAR | Status: DC
  Administered 2014-07-03: 3 mL via INTRAVENOUS

## 2014-07-03 MED ORDER — ONDANSETRON HCL 4 MG/2ML IJ SOLN: 4.0000 mg | Freq: Four times a day (QID) | INTRAMUSCULAR | Status: DC | PRN

## 2014-07-03 MED ORDER — HYDRALAZINE HCL 20 MG/ML IJ SOLN: 5.0000 mg | INTRAMUSCULAR | Status: DC | PRN

## 2014-07-03 MED ORDER — LABETALOL HCL 5 MG/ML IV SOLN
10.0000 mg | INTRAVENOUS | Status: DC | PRN
  Administered 2014-07-06: 10 mg via INTRAVENOUS
  Filled 2014-07-03 (×2): qty 4

## 2014-07-03 MED ORDER — GLIMEPIRIDE 2 MG PO TABS
3.0000 mg | ORAL_TABLET | Freq: Every day | ORAL | Status: DC
  Administered 2014-07-04 – 2014-07-07 (×3): 3 mg via ORAL
  Filled 2014-07-03 (×6): qty 1

## 2014-07-03 MED ORDER — ISOSORBIDE MONONITRATE ER 60 MG PO TB24
60.0000 mg | ORAL_TABLET | Freq: Every day | ORAL | Status: DC
  Administered 2014-07-03 – 2014-07-08 (×5): 60 mg via ORAL
  Filled 2014-07-03 (×6): qty 1

## 2014-07-03 MED ORDER — ALUM & MAG HYDROXIDE-SIMETH 200-200-20 MG/5ML PO SUSP: 15.0000 mL | ORAL | Status: DC | PRN

## 2014-07-03 MED ORDER — ASPIRIN 81 MG PO TABS: 81.0000 mg | ORAL_TABLET | Freq: Every day | ORAL | Status: DC

## 2014-07-03 MED ORDER — GUAIFENESIN-DM 100-10 MG/5ML PO SYRP: 15.0000 mL | ORAL_SOLUTION | ORAL | Status: DC | PRN

## 2014-07-03 MED ORDER — SODIUM CHLORIDE 0.9 % IV SOLN: 250.0000 mL | INTRAVENOUS | Status: DC | PRN

## 2014-07-03 MED ORDER — PANTOPRAZOLE SODIUM 40 MG PO TBEC
40.0000 mg | DELAYED_RELEASE_TABLET | Freq: Every day | ORAL | Status: DC
  Filled 2014-07-03: qty 1

## 2014-07-03 MED ORDER — ACETAMINOPHEN 500 MG PO TABS
1000.0000 mg | ORAL_TABLET | Freq: Four times a day (QID) | ORAL | Status: DC | PRN
  Administered 2014-07-03 – 2014-07-05 (×5): 1000 mg via ORAL
  Filled 2014-07-03 (×5): qty 2

## 2014-07-03 MED ORDER — GLIMEPIRIDE 2 MG PO TABS: 2.0000 mg | ORAL_TABLET | Freq: Two times a day (BID) | ORAL | Status: DC

## 2014-07-03 MED ORDER — ASPIRIN EC 81 MG PO TBEC
81.0000 mg | DELAYED_RELEASE_TABLET | Freq: Every day | ORAL | Status: DC
  Administered 2014-07-04 – 2014-07-08 (×4): 81 mg via ORAL
  Filled 2014-07-03 (×6): qty 1

## 2014-07-03 MED ORDER — NITROGLYCERIN 0.4 MG SL SUBL: 0.4000 mg | SUBLINGUAL_TABLET | SUBLINGUAL | Status: DC | PRN

## 2014-07-03 MED ORDER — VANCOMYCIN HCL IN DEXTROSE 1-5 GM/200ML-% IV SOLN
1000.0000 mg | INTRAVENOUS | Status: DC
  Administered 2014-07-06: 1000 mg via INTRAVENOUS
  Filled 2014-07-03 (×2): qty 200

## 2014-07-03 MED ORDER — METOPROLOL TARTRATE 1 MG/ML IV SOLN
2.0000 mg | INTRAVENOUS | Status: DC | PRN
Start: 2014-07-03 — End: 2014-07-06

## 2014-07-03 MED ORDER — POTASSIUM CHLORIDE CRYS ER 20 MEQ PO TBCR
20.0000 meq | EXTENDED_RELEASE_TABLET | Freq: Once | ORAL | Status: DC
  Filled 2014-07-03: qty 1

## 2014-07-03 MED ORDER — ACETAMINOPHEN 650 MG RE SUPP: 325.0000 mg | RECTAL | Status: DC | PRN

## 2014-07-03 MED ORDER — VITAMIN B-12 100 MCG PO TABS
50.0000 ug | ORAL_TABLET | Freq: Every day | ORAL | Status: DC
  Administered 2014-07-04 – 2014-07-08 (×4): 50 ug via ORAL
  Filled 2014-07-03 (×6): qty 1

## 2014-07-03 MED ORDER — POVIDONE-IODINE 10 % EX SOLN
1.0000 | Freq: Two times a day (BID) | CUTANEOUS | Status: DC
Start: 2014-07-03 — End: 2014-07-08
  Administered 2014-07-03 – 2014-07-07 (×4): 1 via TOPICAL
  Filled 2014-07-03: qty 15

## 2014-07-03 MED ORDER — METOPROLOL SUCCINATE ER 50 MG PO TB24
50.0000 mg | ORAL_TABLET | Freq: Every day | ORAL | Status: DC
  Administered 2014-07-04 – 2014-07-08 (×4): 50 mg via ORAL
  Filled 2014-07-03 (×6): qty 1

## 2014-07-03 MED ORDER — GLIMEPIRIDE 2 MG PO TABS
2.0000 mg | ORAL_TABLET | Freq: Every day | ORAL | Status: DC
  Administered 2014-07-03 – 2014-07-07 (×4): 2 mg via ORAL
  Filled 2014-07-03 (×6): qty 1

## 2014-07-03 MED ORDER — ACETAMINOPHEN 325 MG PO TABS: 325.0000 mg | ORAL_TABLET | ORAL | Status: DC | PRN

## 2014-07-03 MED ORDER — SENNA 8.6 MG PO TABS
1.0000 | ORAL_TABLET | Freq: Every day | ORAL | Status: DC | PRN
  Filled 2014-07-03: qty 1

## 2014-07-03 MED ORDER — PHENOL 1.4 % MT LIQD
1.0000 | OROMUCOSAL | Status: DC | PRN
  Filled 2014-07-03: qty 177

## 2014-07-03 MED ORDER — OXYCODONE HCL 5 MG PO TABS: 5.0000 mg | ORAL_TABLET | ORAL | Status: DC | PRN

## 2014-07-03 MED ORDER — BACITRACIN-NEOMYCIN-POLYMYXIN OINTMENT TUBE
1.0000 "application " | TOPICAL_OINTMENT | Freq: Two times a day (BID) | CUTANEOUS | Status: DC
  Administered 2014-07-03 – 2014-07-07 (×4): 1 via TOPICAL
  Filled 2014-07-03 (×2): qty 1
  Filled 2014-07-03: qty 15

## 2014-07-03 MED ORDER — GERITOL TONIC PO LIQD: 5.0000 mL | Freq: Every day | ORAL | Status: DC

## 2014-07-03 MED ORDER — BISACODYL 5 MG PO TBEC
5.0000 mg | DELAYED_RELEASE_TABLET | Freq: Every day | ORAL | Status: DC | PRN
  Administered 2014-07-03: 5 mg via ORAL
  Filled 2014-07-03: qty 1

## 2014-07-03 MED ORDER — INSULIN ASPART 100 UNIT/ML ~~LOC~~ SOLN
0.0000 [IU] | Freq: Three times a day (TID) | SUBCUTANEOUS | Status: DC
  Administered 2014-07-04: 3 [IU] via SUBCUTANEOUS
  Administered 2014-07-05: 5 [IU] via SUBCUTANEOUS
  Administered 2014-07-07 – 2014-07-08 (×2): 3 [IU] via SUBCUTANEOUS

## 2014-07-03 MED ORDER — EPTIFIBATIDE 75 MG/100ML IV SOLN
2.0000 ug/kg/min | INTRAVENOUS | Status: DC
  Administered 2014-07-03: 2 ug/kg/min via INTRAVENOUS
  Filled 2014-07-03 (×12): qty 100

## 2014-07-03 MED ORDER — SODIUM CHLORIDE 0.9 % IJ SOLN: 3.0000 mL | INTRAMUSCULAR | Status: DC | PRN

## 2014-07-03 MED ORDER — PANTOPRAZOLE SODIUM 40 MG PO TBEC
40.0000 mg | DELAYED_RELEASE_TABLET | Freq: Every day | ORAL | Status: DC
  Administered 2014-07-03 – 2014-07-08 (×5): 40 mg via ORAL
  Filled 2014-07-03 (×5): qty 1

## 2014-07-03 NOTE — Progress Notes (Signed)
[place] for Integrilin Indication: ACS  Allergies  Allergen Reactions  . Codeine Other (See Comments)    Knocked her out of equilibrium. Made her world flip upside down  . Lipitor [Atorvastatin] Other (See Comments)    Severe pain/cramping in legs  . Azor [Amlodipine-Olmesartan] Swelling, Palpitations and Rash    Amlodipine alone resumed 12/2013, tolerating  . Lisinopril Cough  . Penicillins Other (See Comments)    Makes exzcema worse    Patient Measurements: Height: 5\' 7"  (170.2 cm) Weight: 167 lb 9.6 oz (76.023 kg) IBW/kg (Calculated) : 61.6   Vital Signs: Temp: 98.2 F (36.8 C) (02/05 1300) Temp Source: Oral (02/05 1300) BP: 154/70 mmHg (02/05 1300) Pulse Rate: 71 (02/05 1300)  Labs: No results for input(s): HGB, HCT, PLT, APTT, LABPROT, INR, HEPARINUNFRC, CREATININE, CKTOTAL, CKMB, TROPONINI in the last 72 hours.  CrCl cannot be calculated (Patient has no serum creatinine result on file.).   Medical History: Past Medical History  Diagnosis Date  . Peripheral vascular disease   . Diabetes mellitus   . Hyperlipidemia   . Hypertension   . TIA (transient ischemic attack)   . Arthritis     osteoarthritis  . Aneurysm   . Stroke   . AAA (abdominal aortic aneurysm)   . Myocardial infarction     Medications:  See EMR  Assessment: 74 yo female admitted for femoropopilteal bypass on Monday 2/8. Pt stopped taking her Brilinta on 07/01/14, she will be bridged with Integrilin until her procedure. She had a STEMI two months ago, with PCI to RCA and is high risk for stent thrombosis. She also has a history of DVT.  Current labs pending, most recent SCr wnl, estimated normalized CrCl ~55 ml/min. Low hgb at baseline, plts wnl.  Goal of Therapy:  Monitor platelets by anticoagulation protocol: Yes   Plan:  Integrilin 2 mcg/kg/min Daily CBC Monitor plts   Hughes Better, PharmD, BCPS Clinical Pharmacist Pager: [phone] 07/03/2014 8:15  PM

## 2014-07-03 NOTE — Progress Notes (Signed)
Noticed this evening that held orders were in place for pt for Monday's fem pop.  Attempted to release relevant preop orders and system would not allow these to be released until 2/8.  Let PM nurse know in report to discuss with MD in AM.

## 2014-07-04 LAB — GLUCOSE, CAPILLARY
GLUCOSE-CAPILLARY: 100 mg/dL — AB (ref 70–99)
GLUCOSE-CAPILLARY: 155 mg/dL — AB (ref 70–99)
GLUCOSE-CAPILLARY: 125 mg/dL — AB (ref 70–99)
GLUCOSE-CAPILLARY: 143 mg/dL — AB (ref 70–99)

## 2014-07-04 LAB — URINE MICROSCOPIC-ADD ON

## 2014-07-04 LAB — URINALYSIS, ROUTINE W REFLEX MICROSCOPIC
Bilirubin Urine: NEGATIVE
GLUCOSE, UA: NEGATIVE mg/dL
Hgb urine dipstick: NEGATIVE
Ketones, ur: NEGATIVE mg/dL
NITRITE: NEGATIVE
Protein, ur: NEGATIVE mg/dL
Specific Gravity, Urine: 1.01 (ref 1.005–1.030)
UROBILINOGEN UA: 0.2 mg/dL (ref 0.0–1.0)
pH: 5.5 (ref 5.0–8.0)

## 2014-07-04 LAB — CBC
HCT: 32.6 % — ABNORMAL LOW (ref 36.0–46.0)
Hemoglobin: 10.7 g/dL — ABNORMAL LOW (ref 12.0–15.0)
MCH: 28.2 pg (ref 26.0–34.0)
MCHC: 32.8 g/dL (ref 30.0–36.0)
MCV: 85.8 fL (ref 78.0–100.0)
Platelets: 185 10*3/uL (ref 150–400)
RBC: 3.8 MIL/uL — AB (ref 3.87–5.11)
RDW: 15.8 % — ABNORMAL HIGH (ref 11.5–15.5)
WBC: 6.5 10*3/uL (ref 4.0–10.5)

## 2014-07-04 LAB — HEMOGLOBIN A1C
Hgb A1c MFr Bld: 8 % — ABNORMAL HIGH (ref 4.8–5.6)
Mean Plasma Glucose: 183 mg/dL

## 2014-07-04 NOTE — Progress Notes (Signed)
[place] for Integrilin Indication: bridging while off of Brilinta  Allergies  Allergen Reactions  . Codeine Other (See Comments)    Knocked her out of equilibrium. Made her world flip upside down  . Lipitor [Atorvastatin] Other (See Comments)    Severe pain/cramping in legs  . Azor [Amlodipine-Olmesartan] Swelling, Palpitations and Rash    Amlodipine alone resumed 12/2013, tolerating  . Lisinopril Cough  . Penicillins Other (See Comments)    Makes exzcema worse    Patient Measurements: Height: 5\' 7"  (170.2 cm) Weight: 167 lb 9.6 oz (76.023 kg) IBW/kg (Calculated) : 61.6   Vital Signs: Temp: 98.8 F (37.1 C) (02/06 1349) Temp Source: Oral (02/06 1349) BP: 162/61 mmHg (02/06 1349) Pulse Rate: 64 (02/06 1349)  Labs:  Recent Labs  07/03/14 2143 07/04/14 1458  HGB 10.5* 10.7*  HCT 31.7* 32.6*  PLT 185 185  LABPROT 12.9  --   INR 0.96  --   CREATININE 1.33*  --     Estimated Creatinine Clearance: 38.9 mL/min (by C-G formula based on Cr of 1.33).   Medical History: Past Medical History  Diagnosis Date  . Peripheral vascular disease   . Diabetes mellitus   . Hyperlipidemia   . Hypertension   . TIA (transient ischemic attack)   . Arthritis     osteoarthritis  . Aneurysm   . Stroke   . AAA (abdominal aortic aneurysm)   . Myocardial infarction     Medications:  See EMR  Assessment: 74 yo female admitted for femoropopilteal bypass on Monday 2/8. Pt stopped taking her Brilinta on 07/01/14, she will be bridged with Integrilin until her procedure. She had a STEMI two months ago, with PCI to RCA and is high risk for stent thrombosis. She also has a history of DVT.  Current labs pending, most recent SCr wnl, estimated normalized CrCl ~55 ml/min. Low hgb at baseline, plts wnl, stable.   Goal of Therapy:  Monitor platelets by anticoagulation protocol: Yes   Plan:  Integrilin 2 mcg/kg/min Daily CBC Monitor platelets.  Uvaldo Rising, BCPS  Clinical Pharmacist Pager [phone]  07/04/2014 3:48 PM

## 2014-07-04 NOTE — Progress Notes (Signed)
Subjective: Interval History: none..   Objective: Vital signs in last 24 hours: Temp:  [98.2 F (36.8 C)-98.3 F (36.8 C)] 98.3 F (36.8 C) (02/06 0528) Pulse Rate:  [71-73] 73 (02/06 0528) Resp:  [16-18] 16 (02/06 0528) BP: (142-154)/(55-70) 142/55 mmHg (02/06 0528) SpO2:  [95 %-100 %] 95 % (02/06 0528) Weight:  [167 lb 9.6 oz (76.023 kg)] 167 lb 9.6 oz (76.023 kg) (02/05 1300)  Intake/Output from previous day: 02/05 0701 - 02/06 0700 In: 100 [P.O.:100] Out: 500 [Urine:500] Intake/Output this shift: Total I/O In: 200 [P.O.:200] Out: 1 [Stool:1]  Alert comfortable. distress.  Lab Results:  Recent Labs  07/03/14 2143  WBC 6.9  HGB 10.5*  HCT 31.7*  PLT 185   BMET  Recent Labs  07/03/14 2143  NA 137  K 4.0  CL 109  CO2 18*  GLUCOSE 186*  BUN 20  CREATININE 1.33*  CALCIUM 9.3    Studies/Results: Dg Chest 2 View  07/03/2014   CLINICAL DATA:  Preop for vascular surgery.  EXAM: CHEST  2 VIEW  COMPARISON:  04/12/2014  FINDINGS: The cardiac silhouette, mediastinal and hilar contours are within normal limits. There is mild tortuosity and calcification of the thoracic aorta. Chronic bronchitic type interstitial lung changes but no acute pulmonary findings. No pleural effusion. The bony thorax is intact.  IMPRESSION: Chronic lung changes but no acute pulmonary findings.   Electronically Signed   By: Kalman Jewels M.D.   On: 07/03/2014 22:16   Anti-infectives: Anti-infectives    Start     Dose/Rate Route Frequency Ordered Stop   07/06/14 0600  vancomycin (VANCOCIN) IVPB 1000 mg/200 mL premix     1,000 mg200 mL/hr over 60 Minutes Intravenous On call to O.R. 07/03/14 1959        Assessment/Plan: s/p Procedure(s): BYPASS GRAFT FEMORAL-POPLITEAL ARTERY (Left) Scheduled patient. Plan for left femoral-popliteal bypass on Monday. Here for the IV Integrilin   LOS: 1 day   [name] 07/04/2014, 10:41 AM

## 2014-07-05 LAB — GLUCOSE, CAPILLARY
GLUCOSE-CAPILLARY: 211 mg/dL — AB (ref 70–99)
GLUCOSE-CAPILLARY: 105 mg/dL — AB (ref 70–99)
Glucose-Capillary: 126 mg/dL — ABNORMAL HIGH (ref 70–99)
Glucose-Capillary: 174 mg/dL — ABNORMAL HIGH (ref 70–99)

## 2014-07-05 LAB — SURGICAL PCR SCREEN
MRSA, PCR: NEGATIVE
Staphylococcus aureus: NEGATIVE

## 2014-07-05 LAB — CBC
HEMATOCRIT: 33.4 % — AB (ref 36.0–46.0)
Hemoglobin: 10.9 g/dL — ABNORMAL LOW (ref 12.0–15.0)
MCH: 28.6 pg (ref 26.0–34.0)
MCHC: 32.6 g/dL (ref 30.0–36.0)
MCV: 87.7 fL (ref 78.0–100.0)
PLATELETS: 202 10*3/uL (ref 150–400)
RBC: 3.81 MIL/uL — AB (ref 3.87–5.11)
RDW: 16.1 % — ABNORMAL HIGH (ref 11.5–15.5)
WBC: 5.4 10*3/uL (ref 4.0–10.5)

## 2014-07-05 NOTE — Progress Notes (Signed)
[facility]  Patient Status: Active (receiving services up to time of hospitalization)  [facility] is providing the following services: RN  If patient discharges after hours, please call [phone].   [name] 07/05/2014, 10:34 AM

## 2014-07-05 NOTE — Progress Notes (Signed)
Subjective: Interval History: none..   Objective: Vital signs in last 24 hours: Temp:  [97.7 F (36.5 C)-98.8 F (37.1 C)] 97.7 F (36.5 C) (02/07 0443) Pulse Rate:  [64-71] 71 (02/07 0443) Resp:  [16-18] 18 (02/07 0443) BP: (157-169)/(61-81) 160/78 mmHg (02/07 0443) SpO2:  [96 %-100 %] 100 % (02/07 0443)  Intake/Output from previous day: 02/06 0701 - 02/07 0700 In: 2780 [P.O.:2780] Out: 2 [Urine:1; Stool:1] Intake/Output this shift: Total I/O In: 240 [P.O.:240] Out: 1 [Urine:1]  No change in physical exam  Lab Results:  Recent Labs  07/03/14 2143 07/04/14 1458  WBC 6.9 6.5  HGB 10.5* 10.7*  HCT 31.7* 32.6*  PLT 185 185   BMET  Recent Labs  07/03/14 2143  NA 137  K 4.0  CL 109  CO2 18*  GLUCOSE 186*  BUN 20  CREATININE 1.33*  CALCIUM 9.3    Studies/Results: Dg Chest 2 View  07/03/2014   CLINICAL DATA:  Preop for vascular surgery.  EXAM: CHEST  2 VIEW  COMPARISON:  04/12/2014  FINDINGS: The cardiac silhouette, mediastinal and hilar contours are within normal limits. There is mild tortuosity and calcification of the thoracic aorta. Chronic bronchitic type interstitial lung changes but no acute pulmonary findings. No pleural effusion. The bony thorax is intact.  IMPRESSION: Chronic lung changes but no acute pulmonary findings.   Electronically Signed   By: Kalman Jewels M.D.   On: 07/03/2014 22:16   Anti-infectives: Anti-infectives    Start     Dose/Rate Route Frequency Ordered Stop   07/06/14 0600  vancomycin (VANCOCIN) IVPB 1000 mg/200 mL premix     1,000 mg200 mL/hr over 60 Minutes Intravenous On call to O.R. 07/03/14 1959        Assessment/Plan: s/p Procedure(s): BYPASS GRAFT FEMORAL-POPLITEAL ARTERY (Left) Ready for surgery in the morning   LOS: 2 days   [name] 07/05/2014, 9:29 AM

## 2014-07-05 NOTE — Progress Notes (Signed)
[place] for Integrilin Indication: bridging while off of Brilinta  Allergies  Allergen Reactions  . Codeine Other (See Comments)    Knocked her out of equilibrium. Made her world flip upside down  . Lipitor [Atorvastatin] Other (See Comments)    Severe pain/cramping in legs  . Azor [Amlodipine-Olmesartan] Swelling, Palpitations and Rash    Amlodipine alone resumed 12/2013, tolerating  . Lisinopril Cough  . Penicillins Other (See Comments)    Makes exzcema worse    Patient Measurements: Height: 5\' 7"  (170.2 cm) Weight: 167 lb 9.6 oz (76.023 kg) IBW/kg (Calculated) : 61.6   Vital Signs: Temp: 98.5 F (36.9 C) (02/07 1438) Temp Source: Oral (02/07 1438) BP: 152/79 mmHg (02/07 1438) Pulse Rate: 72 (02/07 1438)  Labs:  Recent Labs  07/03/14 2143 07/04/14 1458 07/05/14 1428  HGB 10.5* 10.7* 10.9*  HCT 31.7* 32.6* 33.4*  PLT 185 185 202  LABPROT 12.9  --   --   INR 0.96  --   --   CREATININE 1.33*  --   --     Estimated Creatinine Clearance: 38.9 mL/min (by C-G formula based on Cr of 1.33).   Medical History: Past Medical History  Diagnosis Date  . Peripheral vascular disease   . Diabetes mellitus   . Hyperlipidemia   . Hypertension   . TIA (transient ischemic attack)   . Arthritis     osteoarthritis  . Aneurysm   . Stroke   . AAA (abdominal aortic aneurysm)   . Myocardial infarction     Medications:  See EMR  Assessment: 74 yo female admitted for femoropopilteal bypass on Monday 2/8. Pt stopped taking her Brilinta on 07/01/14, she will be bridged with Integrilin until her procedure. She had a STEMI two months ago, with PCI to RCA and is high risk for stent thrombosis. She also has a history of DVT.  Current labs pending, most recent SCr wnl, estimated normalized CrCl ~55 ml/min. Low hgb at baseline, plts wnl, stable.   Goal of Therapy:  Monitor platelets by anticoagulation protocol: Yes   Plan:  Integrilin 2  mcg/kg/min Daily CBC Monitor platelets.  Uvaldo Rising, BCPS  Clinical Pharmacist Pager [phone]  07/05/2014 3:39 PM

## 2014-07-06 ENCOUNTER — Inpatient Hospital Stay (HOSPITAL_COMMUNITY): Payer: Medicare Other | Admitting: Anesthesiology

## 2014-07-06 ENCOUNTER — Encounter (HOSPITAL_COMMUNITY)
Admission: AD | Disposition: A | Discharge status: Home Health | Payer: Self-pay | Source: Ambulatory Visit | Attending: Vascular Surgery

## 2014-07-06 ENCOUNTER — Inpatient Hospital Stay (HOSPITAL_COMMUNITY): Payer: Medicare Other

## 2014-07-06 DIAGNOSIS — I70244 Atherosclerosis of native arteries of left leg with ulceration of heel and midfoot: Secondary | ICD-10-CM

## 2014-07-06 HISTORY — PX: FEMORAL-POPLITEAL BYPASS GRAFT: SHX937

## 2014-07-06 LAB — CBC
HCT: 34.6 % — ABNORMAL LOW (ref 36.0–46.0)
Hemoglobin: 11.3 g/dL — ABNORMAL LOW (ref 12.0–15.0)
MCH: 28.8 pg (ref 26.0–34.0)
MCHC: 32.7 g/dL (ref 30.0–36.0)
MCV: 88 fL (ref 78.0–100.0)
PLATELETS: 181 10*3/uL (ref 150–400)
RBC: 3.93 MIL/uL (ref 3.87–5.11)
RDW: 16.1 % — AB (ref 11.5–15.5)
WBC: 5.5 10*3/uL (ref 4.0–10.5)

## 2014-07-06 LAB — BASIC METABOLIC PANEL
ANION GAP: 8 (ref 5–15)
BUN: 18 mg/dL (ref 6–23)
CHLORIDE: 111 mmol/L (ref 96–112)
CO2: 20 mmol/L (ref 19–32)
CREATININE: 1.33 mg/dL — AB (ref 0.50–1.10)
Calcium: 9.5 mg/dL (ref 8.4–10.5)
GFR calc Af Amer: 44 mL/min — ABNORMAL LOW (ref >90)
GFR, EST NON AFRICAN AMERICAN: 38 mL/min — AB (ref >90)
Glucose, Bld: 140 mg/dL — ABNORMAL HIGH (ref 70–99)
POTASSIUM: 4.3 mmol/L (ref 3.5–5.1)
SODIUM: 139 mmol/L (ref 135–145)

## 2014-07-06 LAB — GLUCOSE, CAPILLARY: GLUCOSE-CAPILLARY: 126 mg/dL — AB (ref 70–99)

## 2014-07-06 SURGERY — BYPASS GRAFT FEMORAL-POPLITEAL ARTERY
Anesthesia: General | Site: Leg Upper | Laterality: Left

## 2014-07-06 MED ORDER — PROTAMINE SULFATE 10 MG/ML IV SOLN
INTRAVENOUS | Status: AC
  Filled 2014-07-06: qty 10

## 2014-07-06 MED ORDER — HEPARIN SODIUM (PORCINE) 1000 UNIT/ML IJ SOLN
INTRAMUSCULAR | Status: DC | PRN
  Administered 2014-07-06: 8000 [IU] via INTRAVENOUS
  Administered 2014-07-06: 5000 [IU] via INTRAVENOUS

## 2014-07-06 MED ORDER — DOCUSATE SODIUM 100 MG PO CAPS
100.0000 mg | ORAL_CAPSULE | Freq: Every day | ORAL | Status: DC
  Administered 2014-07-07 – 2014-07-08 (×2): 100 mg via ORAL
  Filled 2014-07-06 (×2): qty 1

## 2014-07-06 MED ORDER — ROCURONIUM BROMIDE 100 MG/10ML IV SOLN
INTRAVENOUS | Status: DC | PRN
  Administered 2014-07-06: 40 mg via INTRAVENOUS

## 2014-07-06 MED ORDER — OXYCODONE HCL 5 MG PO TABS: 5.0000 mg | ORAL_TABLET | Freq: Once | ORAL | Status: DC | PRN

## 2014-07-06 MED ORDER — OXYCODONE HCL 5 MG PO TABS
5.0000 mg | ORAL_TABLET | ORAL | Status: DC | PRN
  Administered 2014-07-06: 10 mg via ORAL
  Administered 2014-07-07: 5 mg via ORAL
  Administered 2014-07-07 (×2): 10 mg via ORAL
  Filled 2014-07-06 (×2): qty 2
  Filled 2014-07-06: qty 1
  Filled 2014-07-06: qty 2

## 2014-07-06 MED ORDER — SODIUM CHLORIDE 0.9 % IV SOLN: 500.0000 mL | Freq: Once | INTRAVENOUS | Status: AC | PRN

## 2014-07-06 MED ORDER — SODIUM CHLORIDE 0.9 % IV SOLN
INTRAVENOUS | Status: DC
  Administered 2014-07-06: 23:00:00 via INTRAVENOUS

## 2014-07-06 MED ORDER — METOPROLOL TARTRATE 1 MG/ML IV SOLN: 2.0000 mg | INTRAVENOUS | Status: DC | PRN

## 2014-07-06 MED ORDER — HEPARIN SODIUM (PORCINE) 1000 UNIT/ML IJ SOLN
INTRAMUSCULAR | Status: AC
  Filled 2014-07-06: qty 1

## 2014-07-06 MED ORDER — IOHEXOL 300 MG/ML  SOLN
INTRAMUSCULAR | Status: DC | PRN
  Administered 2014-07-06: 25 mL

## 2014-07-06 MED ORDER — LABETALOL HCL 5 MG/ML IV SOLN
10.0000 mg | INTRAVENOUS | Status: DC | PRN
  Filled 2014-07-06: qty 4

## 2014-07-06 MED ORDER — LABETALOL HCL 5 MG/ML IV SOLN
INTRAVENOUS | Status: AC
Start: 2014-07-06 — End: 2014-07-07
  Filled 2014-07-06: qty 4

## 2014-07-06 MED ORDER — VANCOMYCIN HCL IN DEXTROSE 1-5 GM/200ML-% IV SOLN
1000.0000 mg | Freq: Two times a day (BID) | INTRAVENOUS | Status: AC
  Administered 2014-07-06 – 2014-07-07 (×2): 1000 mg via INTRAVENOUS
  Filled 2014-07-06 (×2): qty 200

## 2014-07-06 MED ORDER — PHENOL 1.4 % MT LIQD: 1.0000 | OROMUCOSAL | Status: DC | PRN

## 2014-07-06 MED ORDER — PROTAMINE SULFATE 10 MG/ML IV SOLN
INTRAVENOUS | Status: DC | PRN
  Administered 2014-07-06: 10 mg via INTRAVENOUS
  Administered 2014-07-06: 40 mg via INTRAVENOUS
  Administered 2014-07-06: 20 mg via INTRAVENOUS

## 2014-07-06 MED ORDER — OXYCODONE HCL 5 MG PO TABS
ORAL_TABLET | ORAL | Status: AC
  Administered 2014-07-06: 10 mg via ORAL
  Filled 2014-07-06: qty 2

## 2014-07-06 MED ORDER — FENTANYL CITRATE 0.05 MG/ML IJ SOLN
INTRAMUSCULAR | Status: AC
  Filled 2014-07-06: qty 5

## 2014-07-06 MED ORDER — LIDOCAINE HCL (CARDIAC) 20 MG/ML IV SOLN
INTRAVENOUS | Status: DC | PRN
  Administered 2014-07-06: 60 mg via INTRAVENOUS

## 2014-07-06 MED ORDER — ALUM & MAG HYDROXIDE-SIMETH 200-200-20 MG/5ML PO SUSP: 15.0000 mL | ORAL | Status: DC | PRN

## 2014-07-06 MED ORDER — GUAIFENESIN-DM 100-10 MG/5ML PO SYRP: 15.0000 mL | ORAL_SOLUTION | ORAL | Status: DC | PRN

## 2014-07-06 MED ORDER — PROPOFOL 10 MG/ML IV BOLUS
INTRAVENOUS | Status: AC
  Filled 2014-07-06: qty 20

## 2014-07-06 MED ORDER — PROPOFOL 10 MG/ML IV BOLUS
INTRAVENOUS | Status: DC | PRN
  Administered 2014-07-06: 110 mg via INTRAVENOUS

## 2014-07-06 MED ORDER — 0.9 % SODIUM CHLORIDE (POUR BTL) OPTIME
TOPICAL | Status: DC | PRN
  Administered 2014-07-06 (×2): 1000 mL

## 2014-07-06 MED ORDER — LACTATED RINGERS IV SOLN
INTRAVENOUS | Status: DC | PRN
  Administered 2014-07-06 (×2): via INTRAVENOUS

## 2014-07-06 MED ORDER — ONDANSETRON HCL 4 MG/2ML IJ SOLN
INTRAMUSCULAR | Status: DC | PRN
  Administered 2014-07-06: 4 mg via INTRAVENOUS

## 2014-07-06 MED ORDER — OXYCODONE HCL 5 MG/5ML PO SOLN: 5.0000 mg | Freq: Once | ORAL | Status: DC | PRN

## 2014-07-06 MED ORDER — EPHEDRINE SULFATE 50 MG/ML IJ SOLN
INTRAMUSCULAR | Status: DC | PRN
  Administered 2014-07-06: 5 mg via INTRAVENOUS

## 2014-07-06 MED ORDER — HYDRALAZINE HCL 20 MG/ML IJ SOLN: 5.0000 mg | INTRAMUSCULAR | Status: DC | PRN

## 2014-07-06 MED ORDER — FENTANYL CITRATE 0.05 MG/ML IJ SOLN
INTRAMUSCULAR | Status: DC | PRN
  Administered 2014-07-06 (×2): 50 ug via INTRAVENOUS
  Administered 2014-07-06: 100 ug via INTRAVENOUS
  Administered 2014-07-06: 50 ug via INTRAVENOUS
  Administered 2014-07-06: 100 ug via INTRAVENOUS

## 2014-07-06 MED ORDER — SODIUM CHLORIDE 0.9 % IR SOLN
Status: DC | PRN
  Administered 2014-07-06 (×2)

## 2014-07-06 MED ORDER — HYDROMORPHONE HCL 1 MG/ML IJ SOLN
INTRAMUSCULAR | Status: AC
  Administered 2014-07-06: 0.5 mg via INTRAVENOUS
  Filled 2014-07-06: qty 1

## 2014-07-06 MED ORDER — GLYCOPYRROLATE 0.2 MG/ML IJ SOLN
INTRAMUSCULAR | Status: DC | PRN
  Administered 2014-07-06: .4 mg via INTRAVENOUS

## 2014-07-06 MED ORDER — POTASSIUM CHLORIDE CRYS ER 20 MEQ PO TBCR: 20.0000 meq | EXTENDED_RELEASE_TABLET | Freq: Every day | ORAL | Status: DC | PRN

## 2014-07-06 MED ORDER — HYDROMORPHONE HCL 1 MG/ML IJ SOLN
0.2500 mg | INTRAMUSCULAR | Status: DC | PRN
  Administered 2014-07-06 (×2): 0.5 mg via INTRAVENOUS

## 2014-07-06 MED ORDER — MORPHINE SULFATE 2 MG/ML IJ SOLN: 2.0000 mg | INTRAMUSCULAR | Status: DC | PRN

## 2014-07-06 MED ORDER — MAGNESIUM SULFATE 2 GM/50ML IV SOLN
2.0000 g | Freq: Every day | INTRAVENOUS | Status: DC | PRN
  Filled 2014-07-06: qty 50

## 2014-07-06 MED ORDER — ONDANSETRON HCL 4 MG/2ML IJ SOLN: 4.0000 mg | Freq: Four times a day (QID) | INTRAMUSCULAR | Status: DC | PRN

## 2014-07-06 MED ORDER — THROMBIN 20000 UNITS EX KIT
PACK | CUTANEOUS | Status: AC
  Filled 2014-07-06: qty 1

## 2014-07-06 MED ORDER — THROMBIN 20000 UNITS EX SOLR
CUTANEOUS | Status: AC
  Filled 2014-07-06: qty 20000

## 2014-07-06 MED ORDER — ONDANSETRON HCL 4 MG/2ML IJ SOLN
4.0000 mg | Freq: Four times a day (QID) | INTRAMUSCULAR | Status: DC | PRN
  Filled 2014-07-06: qty 2

## 2014-07-06 MED ORDER — TICAGRELOR 90 MG PO TABS
90.0000 mg | ORAL_TABLET | Freq: Two times a day (BID) | ORAL | Status: DC
  Administered 2014-07-07 – 2014-07-08 (×3): 90 mg via ORAL
  Filled 2014-07-06 (×4): qty 1

## 2014-07-06 MED ORDER — NEOSTIGMINE METHYLSULFATE 10 MG/10ML IV SOLN
INTRAVENOUS | Status: DC | PRN
  Administered 2014-07-06: 3 mg via INTRAVENOUS

## 2014-07-06 SURGICAL SUPPLY — 56 items
BANDAGE ESMARK 6X9 LF (GAUZE/BANDAGES/DRESSINGS) IMPLANT
BNDG ESMARK 6X9 LF (GAUZE/BANDAGES/DRESSINGS)
CANISTER SUCTION 2500CC (MISCELLANEOUS) ×2 IMPLANT
CANNULA VESSEL 3MM 2 BLNT TIP (CANNULA) ×4 IMPLANT
CLIP TI MEDIUM 24 (CLIP) ×2 IMPLANT
CLIP TI WIDE RED SMALL 24 (CLIP) ×2 IMPLANT
CUFF TOURNIQUET SINGLE 24IN (TOURNIQUET CUFF) IMPLANT
CUFF TOURNIQUET SINGLE 34IN LL (TOURNIQUET CUFF) IMPLANT
CUFF TOURNIQUET SINGLE 44IN (TOURNIQUET CUFF) IMPLANT
DRAIN SNY WOU (WOUND CARE) IMPLANT
DRAPE X-RAY CASS 24X20 (DRAPES) ×2 IMPLANT
DRSG COVADERM 4X10 (GAUZE/BANDAGES/DRESSINGS) ×2 IMPLANT
DRSG COVADERM 4X6 (GAUZE/BANDAGES/DRESSINGS) ×8 IMPLANT
ELECT REM PT RETURN 9FT ADLT (ELECTROSURGICAL) ×2
ELECTRODE REM PT RTRN 9FT ADLT (ELECTROSURGICAL) ×1 IMPLANT
EVACUATOR SILICONE 100CC (DRAIN) IMPLANT
GAUZE SPONGE 4X4 12PLY STRL (GAUZE/BANDAGES/DRESSINGS) ×2 IMPLANT
GAUZE SPONGE 4X4 16PLY XRAY LF (GAUZE/BANDAGES/DRESSINGS) ×2 IMPLANT
GLOVE BIO SURGEON STRL SZ7.5 (GLOVE) ×6 IMPLANT
GLOVE BIOGEL PI IND STRL 8 (GLOVE) ×2 IMPLANT
GLOVE BIOGEL PI INDICATOR 8 (GLOVE) ×2
GLOVE SURG SS PI 7.0 STRL IVOR (GLOVE) ×4 IMPLANT
GOWN STRL REUS W/ TWL LRG LVL3 (GOWN DISPOSABLE) ×6 IMPLANT
GOWN STRL REUS W/ TWL XL LVL3 (GOWN DISPOSABLE) ×1 IMPLANT
GOWN STRL REUS W/TWL LRG LVL3 (GOWN DISPOSABLE) ×6
GOWN STRL REUS W/TWL XL LVL3 (GOWN DISPOSABLE) ×1
KIT BASIN OR (CUSTOM PROCEDURE TRAY) ×2 IMPLANT
KIT ROOM TURNOVER OR (KITS) ×2 IMPLANT
LIQUID BAND (GAUZE/BANDAGES/DRESSINGS) ×2 IMPLANT
NS IRRIG 1000ML POUR BTL (IV SOLUTION) ×4 IMPLANT
PACK PERIPHERAL VASCULAR (CUSTOM PROCEDURE TRAY) ×2 IMPLANT
PAD ARMBOARD 7.5X6 YLW CONV (MISCELLANEOUS) ×4 IMPLANT
PADDING CAST COTTON 6X4 STRL (CAST SUPPLIES) IMPLANT
PROBE PENCIL 8 MHZ STRL DISP (MISCELLANEOUS) ×2 IMPLANT
SET COLLECT BLD 21X3/4 12 (NEEDLE) IMPLANT
SET COLLECT BLD 21X3/4 12 PB (MISCELLANEOUS) ×2 IMPLANT
SPONGE SURGIFOAM ABS GEL 100 (HEMOSTASIS) IMPLANT
STAPLER VISISTAT 35W (STAPLE) ×2 IMPLANT
STOPCOCK 4 WAY LG BORE MALE ST (IV SETS) ×2 IMPLANT
SUT PROLENE 5 0 C 1 24 (SUTURE) ×4 IMPLANT
SUT PROLENE 6 0 CC (SUTURE) ×4 IMPLANT
SUT PROLENE 7 0 BV 1 (SUTURE) IMPLANT
SUT PROLENE 7 0 BV1 MDA (SUTURE) ×4 IMPLANT
SUT SILK 2 0 SH (SUTURE) ×2 IMPLANT
SUT SILK 3 0 (SUTURE) ×2
SUT SILK 3-0 18XBRD TIE 12 (SUTURE) ×2 IMPLANT
SUT VIC AB 2-0 CTX 36 (SUTURE) ×4 IMPLANT
SUT VIC AB 3-0 SH 27 (SUTURE) ×5
SUT VIC AB 3-0 SH 27X BRD (SUTURE) ×5 IMPLANT
SUT VIC AB 4-0 PS2 27 (SUTURE) ×2 IMPLANT
TAPE CLOTH SURG 4X10 WHT LF (GAUZE/BANDAGES/DRESSINGS) ×2 IMPLANT
TAPE UMBILICAL COTTON 1/8X30 (MISCELLANEOUS) ×2 IMPLANT
TRAY FOLEY CATH 16FRSI W/METER (SET/KITS/TRAYS/PACK) ×2 IMPLANT
TUBING EXTENTION W/L.L. (IV SETS) ×2 IMPLANT
UNDERPAD 30X30 INCONTINENT (UNDERPADS AND DIAPERS) ×2 IMPLANT
WATER STERILE IRR 1000ML POUR (IV SOLUTION) ×2 IMPLANT

## 2014-07-06 NOTE — Op Note (Signed)
Procedure: Leftt femoral to below knee popliteal bypass with reversed ipsilateral great saphenous vein, intra op agram  Preoperative diagnosis: non healing left first toe  Postoperative diagnosis: Same  Anesthesia: General  Asst.: Leontine Locket, PA-C, Elenor Aguilar RNFA  Operative findings:     good quality saphenous vein 3-4 mm, 1 vessel PT runoff  Operative details: After obtaining informed consent, the patient was taken to the operating room. The patient was placed in supine position on the operating room table. After induction of general anesthesia and endotracheal intubation, a Foley catheter was placed. Next, the patient's entire left lower extremity was prepped and draped in the usual sterile fashion. A longitudinal incision was then made in the left groin and carried down through the subcutaneous tissues to expose the left common femoral artery. There was some scar at the femoral bifurcation but I was able to mobilize enough common femoral artery circumferentially to provide adequate are to clamp the common femoral artery as well as the profunda and superficial femoral artery.  The common femoral artery was dissected free circumferentially. There was a pulse within the common femoral artery. The distal external iliac artery was dissected free circumferentially underneath the inguinal ligament.  A vessel loop was also placed around the distal external iliac artery.   Next the saphenofemoral junction was identified in the medial portion of the groin incision and this was harvested through several skip incisions on the medial aspect of the leg.  Side branches were ligated and divided between silk ties or clips.  The vein was of good quality 3-4 mm diameter although there were some varicose branches.  The vein harvest incision was deepened into the fascia at the below knee segment and the below knee popliteal space was entered.  The popliteal artery was dissected free circumferentially.  It was  soft in character and clampable.  A tunnel was then created between the heads of the gastrocnemius muscle subsartorial up to the groin.  The vein was ligated distally and at the saphenofemoral junction with a 2 0 silk tie.  The vein was gently distended with heparinized saline and inspected for hemostasis.    The patient was given 7000 units of heparin.  After appropriate circulation time, the distal right external iliac artery was controlled with a henley clamp. The profunda and SFA were controlled with vessel loops.   A longitudinal opening was made in the common femoral artery on its anterior surface.  The vessel was calcified but sewable. The vein was placed in a reversed configuration.  The arteriotomy was extended with Pott's scissors.  The vein was spatulated and sewn end to side to the artery using a running 5 0 Prolene.  Just prior to completion ot the anastomosis everything was forebled backbled and thoroughly flushed. Proximal clamp and distal clamps were removed and there was good pulsatile flow in the profunda femoris artery immediately. The SFA was chronically occluded.    The graft was then brought through the subsartorial tunnel down to the below-knee popliteal artery after marking for orientation. The below-knee popliteal artery was controlled proximally with a vessel loop and distally with a fine bulldog clamp. A longitudinal opening was made in the distal below-knee popliteal artery in an area that was free of calcification. The graft was then cut to length and spatulated and sewn end of graft to side of artery using running 6-0 Prolene suture.  At completion of the anastomosis everything was forebled backbled and thoroughly flushed. The remainder of the anastomosis  was completed and all clamps were removed restoring pulsatile flow to the below-knee popliteal artery. An intraoperative arteriogram was obtained with inflow occlusion.  A 21 g butterfly needle was placed in the proximal graft and  contrast injected.  The very distal aspect of the dist anastomosis was visualized and patent with 1 vessel posterior tibial runoff but overlying orthopedic hardware from the patient's prior knee replacement partially obscured the view.  The patient had biphasic Doppler flow in the posterior tibial area of the foot. Flow augmented about 75% with unclamping the graft.  One repair stitch was placed in the lateral wall of the proximal anastomosis .  The patient had been given an additional 5000 units of heparin during the case.  The heparin was fully reversed with 60 mg protamine.  After hemostasis was obtained, the deep layers and subcutaneous layers of the below-knee popliteal incision were closed with running 3-0 Vicryl suture. The skin was closed with staples.   The saphenectomy incisions were closed with running 3 0 vicryl follow by staples.  The groin was inspected and found to be hemostatic. This was then closed in multiple layers of running 2 0 and 3-0 Vicryl suture and 4-0 subcuticular stitch. The patient tolerated the procedure well and there were no complications. Instrument sponge and needle counts correct in the case. Patient was taken to the recovery in stable condition.  Ruta Hinds, MD [facility] [place] Office: [phone] Pager: [phone]

## 2014-07-06 NOTE — Anesthesia Postprocedure Evaluation (Signed)
  Anesthesia Post-op Note  Patient: [name]  Procedure(s) Performed: Procedure(s): BYPASS GRAFT FEMORAL-POPLITEAL ARTERY (Left)  Patient Location: PACU  Anesthesia Type:General  Level of Consciousness: awake and alert   Airway and Oxygen Therapy: Patient Spontanous Breathing  Post-op Pain: none  Post-op Assessment: Post-op Vital signs reviewed  Post-op Vital Signs: Reviewed  Last Vitals:  Filed Vitals:   07/06/14 1815  BP:   Pulse: 72  Temp:   Resp: 19    Complications: No apparent anesthesia complications

## 2014-07-06 NOTE — Transfer of Care (Signed)
Immediate Anesthesia Transfer of Care Note  Patient: [name]  Procedure(s) Performed: Procedure(s): BYPASS GRAFT FEMORAL-POPLITEAL ARTERY (Left)  Patient Location: PACU  Anesthesia Type:General  Level of Consciousness: awake, alert  and oriented  Airway & Oxygen Therapy: Patient Spontanous Breathing and Patient connected to nasal cannula oxygen  Post-op Assessment: Report given to RN and Post -op Vital signs reviewed and stable  Post vital signs: Reviewed and stable  Last Vitals:  Filed Vitals:   07/06/14 1311  BP: 173/55  Pulse: 82  Temp: 36.6 C  Resp: 17    Complications: No apparent anesthesia complications

## 2014-07-06 NOTE — Interval H&P Note (Signed)
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 After consideration of risks, benefits and other options for treatment, the patient has consented to  Procedure(s): BYPASS GRAFT FEMORAL-POPLITEAL ARTERY (Left) as a surgical intervention .  The patient's history has been reviewed, patient examined, no change in status, stable for surgery.  I have reviewed the patient's chart and labs.  Questions were answered to the patient's satisfaction.     Cletis Clack E

## 2014-07-06 NOTE — H&P (View-Only) (Signed)
Subjective: Interval History: none..   Objective: Vital signs in last 24 hours: Temp:  [97.7 F (36.5 C)-98.8 F (37.1 C)] 97.7 F (36.5 C) (02/07 0443) Pulse Rate:  [64-71] 71 (02/07 0443) Resp:  [16-18] 18 (02/07 0443) BP: (157-169)/(61-81) 160/78 mmHg (02/07 0443) SpO2:  [96 %-100 %] 100 % (02/07 0443)  Intake/Output from previous day: 02/06 0701 - 02/07 0700 In: 2780 [P.O.:2780] Out: 2 [Urine:1; Stool:1] Intake/Output this shift: Total I/O In: 240 [P.O.:240] Out: 1 [Urine:1]  No change in physical exam  Lab Results:  Recent Labs  07/03/14 2143 07/04/14 1458  WBC 6.9 6.5  HGB 10.5* 10.7*  HCT 31.7* 32.6*  PLT 185 185   BMET  Recent Labs  07/03/14 2143  NA 137  K 4.0  CL 109  CO2 18*  GLUCOSE 186*  BUN 20  CREATININE 1.33*  CALCIUM 9.3    Studies/Results: Dg Chest 2 View  07/03/2014   CLINICAL DATA:  Preop for vascular surgery.  EXAM: CHEST  2 VIEW  COMPARISON:  04/12/2014  FINDINGS: The cardiac silhouette, mediastinal and hilar contours are within normal limits. There is mild tortuosity and calcification of the thoracic aorta. Chronic bronchitic type interstitial lung changes but no acute pulmonary findings. No pleural effusion. The bony thorax is intact.  IMPRESSION: Chronic lung changes but no acute pulmonary findings.   Electronically Signed   By: Kalman Jewels M.D.   On: 07/03/2014 22:16   Anti-infectives: Anti-infectives    Start     Dose/Rate Route Frequency Ordered Stop   07/06/14 0600  vancomycin (VANCOCIN) IVPB 1000 mg/200 mL premix     1,000 mg200 mL/hr over 60 Minutes Intravenous On call to O.R. 07/03/14 1959        Assessment/Plan: s/p Procedure(s): BYPASS GRAFT FEMORAL-POPLITEAL ARTERY (Left) Ready for surgery in the morning   LOS: 2 days   EARLY, TODD 07/05/2014, 9:29 AM

## 2014-07-06 NOTE — Anesthesia Preprocedure Evaluation (Signed)
Anesthesia Evaluation  Patient identified by MRN, date of birth, ID band Patient awake    Reviewed: Allergy & Precautions, NPO status , Patient's Chart, lab work & pertinent test results  Airway Mallampati: II   Neck ROM: full    Dental   Pulmonary Current Smoker,          Cardiovascular hypertension, + Past MI, + Cardiac Stents and + Peripheral Vascular Disease  Coronary stent placed in November 2015.   Neuro/Psych TIACVA    GI/Hepatic   Endo/Other  diabetes, Type 2  Renal/GU      Musculoskeletal  (+) Arthritis -,   Abdominal   Peds  Hematology   Anesthesia Other Findings   Reproductive/Obstetrics                             Anesthesia Physical Anesthesia Plan  ASA: III  Anesthesia Plan: General   Post-op Pain Management:    Induction: Intravenous  Airway Management Planned: Oral ETT  Additional Equipment:   Intra-op Plan:   Post-operative Plan: Extubation in OR  Informed Consent: I have reviewed the patients History and Physical, chart, labs and discussed the procedure including the risks, benefits and alternatives for the proposed anesthesia with the patient or authorized representative who has indicated his/her understanding and acceptance.     Plan Discussed with: CRNA, Anesthesiologist and Surgeon  Anesthesia Plan Comments:         Anesthesia Quick Evaluation

## 2014-07-06 NOTE — Progress Notes (Signed)
  [facility] Progress Note  07/06/2014 6:42 PM Day of Surgery  Subjective:  Patient seen in PACU. Having some soreness with thigh incisions. Denies any left foot pain. Says "feeling is coming back to left foot."  Filed Vitals:   07/06/14 1815  BP:   Pulse: 72  Temp:   Resp: 19    Physical Exam: Incisions:  Minor drainage on dressing medial popliteal and below knee. Otherwise clean.  Extremities:  Biphasic left PT doppler signal. Prior faint DP doppler signal per nursing.   CBC    Component Value Date/Time   WBC 5.5 07/06/2014 0555   RBC 3.93 07/06/2014 0555   HGB 11.3* 07/06/2014 0555   HCT 34.6* 07/06/2014 0555   PLT 181 07/06/2014 0555   MCV 88.0 07/06/2014 0555   MCH 28.8 07/06/2014 0555   MCHC 32.7 07/06/2014 0555   RDW 16.1* 07/06/2014 0555   LYMPHSABS 1.4 04/12/2014 1521   MONOABS 0.2 04/12/2014 1521   EOSABS 0.1 04/12/2014 1521   BASOSABS 0.0 04/12/2014 1521    BMET    Component Value Date/Time   NA 139 07/06/2014 0555   K 4.3 07/06/2014 0555   CL 111 07/06/2014 0555   CO2 20 07/06/2014 0555   GLUCOSE 140* 07/06/2014 0555   BUN 18 07/06/2014 0555   CREATININE 1.33* 07/06/2014 0555   CALCIUM 9.5 07/06/2014 0555   GFRNONAA 38* 07/06/2014 0555   GFRAA 44* 07/06/2014 0555    INR    Component Value Date/Time   INR 0.96 07/03/2014 2143     Intake/Output Summary (Last 24 hours) at 07/06/14 1842 Last data filed at 07/06/14 1305  Gross per 24 hour  Intake   1700 ml  Output    325 ml  Net   1375 ml     Assessment:  74 y.o. female is s/p: Left femoral to below knee popliteal bypass with reversed ipsilateral great saphenous vein, intra op agram  Day of Surgery  Plan: -Patient stable post-operatively. -Biphasic left PT doppler signal.  -Mobilize tomorrow.  -To 3S when bed available.    Virgina Jock, PA-C [facility] Office: [phone] Pager: [phone] 07/06/2014 6:42 PM

## 2014-07-06 NOTE — Progress Notes (Signed)
Patients belongings sent home with son [name] from [place], patient is aware.    [name]

## 2014-07-07 ENCOUNTER — Encounter (HOSPITAL_COMMUNITY): Payer: Self-pay | Admitting: Vascular Surgery

## 2014-07-07 LAB — BASIC METABOLIC PANEL
ANION GAP: 6 (ref 5–15)
BUN: 17 mg/dL (ref 6–23)
CALCIUM: 9 mg/dL (ref 8.4–10.5)
CHLORIDE: 106 mmol/L (ref 96–112)
CO2: 25 mmol/L (ref 19–32)
Creatinine, Ser: 1.23 mg/dL — ABNORMAL HIGH (ref 0.50–1.10)
GFR calc Af Amer: 48 mL/min — ABNORMAL LOW (ref >90)
GFR calc non Af Amer: 42 mL/min — ABNORMAL LOW (ref >90)
GLUCOSE: 139 mg/dL — AB (ref 70–99)
Potassium: 5.2 mmol/L — ABNORMAL HIGH (ref 3.5–5.1)
SODIUM: 137 mmol/L (ref 135–145)

## 2014-07-07 LAB — GLUCOSE, CAPILLARY
GLUCOSE-CAPILLARY: 129 mg/dL — AB (ref 70–99)
GLUCOSE-CAPILLARY: 178 mg/dL — AB (ref 70–99)
GLUCOSE-CAPILLARY: 189 mg/dL — AB (ref 70–99)
GLUCOSE-CAPILLARY: 174 mg/dL — AB (ref 70–99)
Glucose-Capillary: 102 mg/dL — ABNORMAL HIGH (ref 70–99)

## 2014-07-07 LAB — MRSA PCR SCREENING: MRSA by PCR: NEGATIVE

## 2014-07-07 NOTE — Care Management Note (Signed)
    Page 1 of 2   07/08/2014     12:14:25 PM CARE MANAGEMENT NOTE 07/08/2014  Patient:  [name], [name]   Account Number:  [id number]  Date Initiated:  07/07/2014  Documentation initiated by:  Marvetta Gibbons  Subjective/Objective Assessment:   Pt admitted with PVD     Action/Plan:   PTA pt lived at home has rollator - states that she is active with [facility] for RN/PT- will need resumption orders at time of discharge   Anticipated DC Date:  07/09/2014   Anticipated DC Plan:  [place]  CM consult      [facility] Choice  Resumption Of Svcs/PTA Provider   Choice offered to / List presented to:  C-1 Patient   DME arranged  Vassie Moselle      DME agency  [place] arranged  HH-1 RN  [place].   Status of service:  Completed, signed off Medicare Important Message given?  YES (If response is "NO", the following Medicare IM given date fields will be blank) Date Medicare IM given:  07/07/2014 Medicare IM given by:  Marvetta Gibbons Date Additional Medicare IM given:   Additional Medicare IM given by:    Discharge Disposition:  [place]  Per UR Regulation:  Reviewed for med. necessity/level of care/duration of stay  If discussed at [place] of Stay Meetings, dates discussed:    Comments:  07/08/14- 0930- Marvetta Gibbons RN, BSN [phone] Pt for d/c home today, orders for HH-PT/OT- spoke with pt at bedside- pt states that she is already active with [facility] - call made to [facility] with [facility] for resumption of [facility] services- RN/PT/OT- pt states that she already has 3n1 at home but needs RW- order placed and call made to [facility] with [facility] - RW to be brought to room prior to discharge

## 2014-07-07 NOTE — Progress Notes (Signed)
received patient from 3S stable on RA

## 2014-07-07 NOTE — Progress Notes (Addendum)
I gave report to Chauncey on 2W @1435 .  Pt will be transported to room 2W13. Alert and oriented, VSS. Elink and CCMD notified of transfer. Personal belongings previously sent home with daughter, per pt.

## 2014-07-07 NOTE — Progress Notes (Addendum)
  [facility] Progress Note  07/07/2014 7:30 AM 1 Day Post-Op  Subjective:  5/10 pain with incisions. Just received pain medication. Otherwise no complaints. Wants to go home today.   Tmax 98.4 BP sys 90s-180s 02 100% RA  Filed Vitals:   07/07/14 0630  BP: 167/58  Pulse: 76  Temp:   Resp: 29    Physical Exam: Incisions:  Left groin clean dry and intact. Staple line upper thigh to below knee clean, dry and intact.  Extremities: Faint left DP doppler signal. Biphasic left PT doppler signal. Left great toe wound dry.   CBC    Component Value Date/Time   WBC 5.5 07/06/2014 0555   RBC 3.93 07/06/2014 0555   HGB 11.3* 07/06/2014 0555   HCT 34.6* 07/06/2014 0555   PLT 181 07/06/2014 0555   MCV 88.0 07/06/2014 0555   MCH 28.8 07/06/2014 0555   MCHC 32.7 07/06/2014 0555   RDW 16.1* 07/06/2014 0555   LYMPHSABS 1.4 04/12/2014 1521   MONOABS 0.2 04/12/2014 1521   EOSABS 0.1 04/12/2014 1521   BASOSABS 0.0 04/12/2014 1521    BMET    Component Value Date/Time   NA 137 07/07/2014 0355   K 5.2* 07/07/2014 0355   CL 106 07/07/2014 0355   CO2 25 07/07/2014 0355   GLUCOSE 139* 07/07/2014 0355   BUN 17 07/07/2014 0355   CREATININE 1.23* 07/07/2014 0355   CALCIUM 9.0 07/07/2014 0355   GFRNONAA 42* 07/07/2014 0355   GFRAA 48* 07/07/2014 0355    INR    Component Value Date/Time   INR 0.96 07/03/2014 2143     Intake/Output Summary (Last 24 hours) at 07/07/14 0730 Last data filed at 07/07/14 0600  Gross per 24 hour  Intake 2702.5 ml  Output   1175 ml  Net 1527.5 ml     Assessment:  74 y.o. female is s/p: Left femoral to below knee popliteal bypass with reversed ipsilateral great saphenous vein, intra op agram  1 Day Post-Op  Plan: -Incisions healing well. Good PT doppler signal.  -Wants to go home today and is asking about a "lift" to help with getting up due to left hip pain. PT eval today. -CBC pending.  -Hyperkalemia: 5.2 today. Asymptomatic.  Will monitor.  -Start Brilinta today.  -Will transfer to [place], PA-C [facility] Office: [phone] Pager: [phone] 07/07/2014 7:30 AM   Agree with above Patent bypass warm foot with brisk PT doppler Transfer to 2W if walks today and pain controlled home tomorrow Dry gauze left groin Will put request for lift chair if advised by Physical therapy  Ruta Hinds, MD [facility] [place]: [phone] Pager: [phone]

## 2014-07-07 NOTE — Evaluation (Signed)
Physical Therapy Evaluation Patient Details Name: [name] MRN: [id number] DOB: Nov 05, 1940 Today's Date: 07/07/2014   History of Present Illness  pt presents with L Fem Pop.    Clinical Impression  Pt generally weak and deconditioned.  Pt able to come to stand and ambulate with only guarding A.  Discussed equipment needs for home and discussed a lift chair for home.  Pt only wanting lift chair if it will be paid for by her insurance, discussed that CM would need to check with her insurance company to see if they would offer this level of coverage for a lift chair.  CM made aware.  Will continue to follow while on acute.      Follow Up Recommendations Home health PT;Supervision - Intermittent    Equipment Recommendations  3in1 (PT)    Recommendations for Other Services       Precautions / Restrictions Precautions Precautions: Fall Precaution Comments: pt indicates needing L hip replacement.   Restrictions Weight Bearing Restrictions: No      Mobility  Bed Mobility               General bed mobility comments: pt up in recliner.    Transfers Overall transfer level: Needs assistance Equipment used: Rolling walker (2 wheeled) Transfers: Sit to/from Stand Sit to Stand: Min guard         General transfer comment: pt demos good use of UEs and needs cueing for positioning of L LE.    Ambulation/Gait Ambulation/Gait assistance: Min guard Ambulation Distance (Feet): 30 Feet Assistive device: Rolling walker (2 wheeled) Gait Pattern/deviations: Step-to pattern;Decreased step length - right;Decreased stance time - left     General Gait Details: pt fatigues quickly and indicates pain in L hip and fatigue in Bil UEs limitign mobility.    Stairs            Wheelchair Mobility    Modified Rankin (Stroke Patients Only)       Balance Overall balance assessment: Needs assistance Sitting-balance support: No upper extremity supported;Feet supported Sitting  balance-Leahy Scale: Fair     Standing balance support: Bilateral upper extremity supported Standing balance-Leahy Scale: Poor                               Pertinent Vitals/Pain Pain Assessment: 0-10 Pain Score: 4  Pain Location: L hip worse than L LE incisions.   Pain Descriptors / Indicators: Aching;Constant Pain Intervention(s): Monitored during session;Premedicated before session;Repositioned    Home Living Family/patient expects to be discharged to:: Private residence Living Arrangements: Alone Available Help at Discharge: Family;Available PRN/intermittently Type of Home: House Home Access: Stairs to enter Entrance Stairs-Rails: None Entrance Stairs-Number of Steps: 1 Home Layout: One level Home Equipment: Walker - 4 wheels      Prior Function Level of Independence: Independent with assistive device(s)         Comments: pt indicates she uses her 4WW to sit on in kitchen when preparing meals.       Hand Dominance        Extremity/Trunk Assessment   Upper Extremity Assessment: Defer to OT evaluation           Lower Extremity Assessment: LLE deficits/detail   LLE Deficits / Details: ROM and strength limited by post-op pain and L hip arthritis.    Cervical / Trunk Assessment: Normal  Communication   Communication: No difficulties  Cognition Arousal/Alertness: Awake/alert Behavior During Therapy: WFL for  tasks assessed/performed Overall Cognitive Status: Within Functional Limits for tasks assessed                      General Comments      Exercises        Assessment/Plan    PT Assessment Patient needs continued PT services  PT Diagnosis Difficulty walking   PT Problem List Decreased strength;Decreased range of motion;Decreased activity tolerance;Decreased balance;Decreased mobility;Decreased coordination;Decreased knowledge of use of DME;Pain  PT Treatment Interventions DME instruction;Gait training;Stair  training;Functional mobility training;Therapeutic activities;Therapeutic exercise;Balance training;Patient/family education   PT Goals (Current goals can be found in the Care Plan section) Acute Rehab PT Goals Patient Stated Goal: Walk better.   PT Goal Formulation: With patient Time For Goal Achievement: 07/14/14 Potential to Achieve Goals: Good    Frequency Min 3X/week   Barriers to discharge Decreased caregiver support pt indicates her children work during the day, but can A at times around their work schedules.      Co-evaluation               End of Session Equipment Utilized During Treatment: Gait belt Activity Tolerance: Patient tolerated treatment well Patient left: in chair;with call bell/phone within reach Nurse Communication: Mobility status         Time: 1050-1120 PT Time Calculation (min) (ACUTE ONLY): 30 min   Charges:   PT Evaluation $Initial PT Evaluation Tier I: 1 Procedure PT Treatments $Gait Training: 8-22 mins   PT G CodesCatarina [name], [name] 07/07/2014, 11:51 AM

## 2014-07-07 NOTE — Progress Notes (Signed)
Medicare Important Message given? YES  (If response is "NO", the following Medicare IM given date fields will be blank)  Date Medicare IM given: 07/07/14 Medicare IM given by:  Dahlia Client [employer]

## 2014-07-07 NOTE — Evaluation (Addendum)
Occupational Therapy Evaluation Patient Details Name: [name] MRN: [id number] DOB: 1940-06-18 Today's Date: 07/07/2014    History of Present Illness pt presents with Lt Fem Pop.     Clinical Impression   Pt s/p above. Pt independent with ADLs, PTA. Feel pt will benefit from acute OT to increase independence with BADLs, prior to d/c.    Follow Up Recommendations  Home health OT;Supervision - Intermittent    Equipment Recommendations  None recommended by OT    Recommendations for Other Services       Precautions / Restrictions Precautions Precautions: Fall Precaution Comments: pt indicates needing L hip replacement.   Restrictions Weight Bearing Restrictions: No      Mobility Bed Mobility Overal bed mobility: Needs Assistance Bed Mobility: Sit to Supine       Sit to supine: Supervision   General bed mobility comments: no physical assist needed.  Transfers Overall transfer level: Needs assistance Equipment used: Rolling walker (2 wheeled) Transfers: Sit to/from Stand Sit to Stand: Min guard         General transfer comment: cues for technique.    Balance Ambulating with walker with Min guard assist.                              ADL Overall ADL's : Needs assistance/impaired             Lower Body Bathing: Sit to/from stand;Min guard;With adaptive equipment   Upper Body Dressing : Set up;Sitting   Lower Body Dressing: Minimal assistance;With adaptive equipment;Sit to/from stand   Toilet Transfer: Min guard;Ambulation;RW;Regular Toilet;Grab bars   Toileting- Clothing Manipulation and Hygiene: Minimal assistance;Sit to/from stand         General ADL Comments: Educated on AE and pt practiced with reacher and sockaid. Educated on use of bag on walker or using basket on walker. Discussed briefly tub transfer technique of backing to chair and swinging legs in. Recommended sitting for LB ADLs.     Vision                      Perception     Praxis      Pertinent Vitals/Pain Pain Assessment: 0-10 Pain Score:  (7-8) Pain Location: Lt LE Pain Intervention(s): Monitored during session     Hand Dominance     Extremity/Trunk Assessment Upper Extremity Assessment Upper Extremity Assessment: Overall WFL for tasks assessed   Lower Extremity Assessment Lower Extremity Assessment: Defer to PT evaluation LLE Deficits / Details: ROM and strength limited by post-op pain and L hip arthritis.   LLE: Unable to fully assess due to pain LLE Coordination: decreased fine motor     Communication Communication Communication: No difficulties   Cognition Arousal/Alertness: Awake/alert Behavior During Therapy: WFL for tasks assessed/performed Overall Cognitive Status: Within Functional Limits for tasks assessed                     General Comments       Exercises       Shoulder Instructions      Home Living Family/patient expects to be discharged to:: Private residence Living Arrangements: Alone Available Help at Discharge: Family;Available PRN/intermittently Type of Home: House Home Access: Stairs to enter [employer] of Steps: 1 Entrance Stairs-Rails: None Home Layout: One level     Bathroom Shower/Tub: Teacher, early years/pre: Handicapped height (sink close)     Home Equipment: Environmental consultant -  4 wheels;Shower seat;Adaptive equipment (unsure if it is tub bench or chair) Adaptive Equipment: Reacher        Prior Functioning/Environment Level of Independence: Independent with assistive device(s)        Comments: pt indicates she uses her 4WW to sit on in kitchen when preparing meals.      OT Diagnosis: Acute pain   OT Problem List: Decreased strength;Decreased range of motion;Decreased activity tolerance;Decreased knowledge of use of DME or AE;Decreased knowledge of precautions;Pain   OT Treatment/Interventions: Self-care/ADL training;DME and/or AE  instruction;Therapeutic activities;Patient/family education;Balance training    OT Goals(Current goals can be found in the care plan section) Acute Rehab OT Goals Patient Stated Goal: not stated OT Goal Formulation: With patient Time For Goal Achievement: 07/14/14 Potential to Achieve Goals: Good ADL Goals Pt Will Perform Lower Body Dressing: with adaptive equipment;sit to/from stand;with set-up Pt Will Transfer to Toilet: with modified independence;ambulating Pt Will Perform Toileting - Clothing Manipulation and hygiene: with modified independence;sit to/from stand  OT Frequency: Min 2X/week   Barriers to D/C:            Co-evaluation              End of Session Equipment Utilized During Treatment: Rolling walker;Gait belt  Activity Tolerance: Patient tolerated treatment well Patient left: in bed;with call bell/phone within reach   Time: 1210-1229 OT Time Calculation (min): 19 min Charges:  OT General Charges $OT Visit: 1 Procedure OT Evaluation $Initial OT Evaluation Tier I: 1 Procedure G-CodesBenito Mccreedy OTR/L [id number] 07/07/2014, 1:27 PM

## 2014-07-07 NOTE — Progress Notes (Signed)
Utilization review completed.  

## 2014-07-08 ENCOUNTER — Telehealth: Payer: Self-pay | Admitting: Vascular Surgery

## 2014-07-08 LAB — CBC
HEMATOCRIT: 29.1 % — AB (ref 36.0–46.0)
Hemoglobin: 9.6 g/dL — ABNORMAL LOW (ref 12.0–15.0)
MCH: 28.6 pg (ref 26.0–34.0)
MCHC: 33 g/dL (ref 30.0–36.0)
MCV: 86.6 fL (ref 78.0–100.0)
Platelets: 169 10*3/uL (ref 150–400)
RBC: 3.36 MIL/uL — ABNORMAL LOW (ref 3.87–5.11)
RDW: 16.4 % — AB (ref 11.5–15.5)
WBC: 9.5 10*3/uL (ref 4.0–10.5)

## 2014-07-08 LAB — GLUCOSE, CAPILLARY
GLUCOSE-CAPILLARY: 112 mg/dL — AB (ref 70–99)
Glucose-Capillary: 165 mg/dL — ABNORMAL HIGH (ref 70–99)
Glucose-Capillary: 154 mg/dL — ABNORMAL HIGH (ref 70–99)

## 2014-07-08 MED ORDER — OXYCODONE HCL 5 MG PO TABS: 5.0000 mg | ORAL_TABLET | Freq: Four times a day (QID) | ORAL | Status: DC | PRN

## 2014-07-08 NOTE — Progress Notes (Addendum)
  [facility] Progress Note  07/08/2014 7:51 AM 2 Days Post-Op  Subjective:  No complaints. Denies any pain.   Tmax 99.1 BP sys 130s-160s 02 99% RA  Filed Vitals:   07/08/14 0416  BP: 139/74  Pulse: 91  Temp: 98.2 F (36.8 C)  Resp: 18    Physical Exam: General: sitting in chair in NAD Incisions:  Left groin incision clean and intact. Small amount of dried blood to medial thigh staple line. No active bleeding. Otherwise left leg staple line clean dry and intact.  Extremities:  Brisk left PT doppler signal.   CBC    Component Value Date/Time   WBC 9.5 07/08/2014 0538   RBC 3.36* 07/08/2014 0538   HGB 9.6* 07/08/2014 0538   HCT 29.1* 07/08/2014 0538   PLT 169 07/08/2014 0538   MCV 86.6 07/08/2014 0538   MCH 28.6 07/08/2014 0538   MCHC 33.0 07/08/2014 0538   RDW 16.4* 07/08/2014 0538   LYMPHSABS 1.4 04/12/2014 1521   MONOABS 0.2 04/12/2014 1521   EOSABS 0.1 04/12/2014 1521   BASOSABS 0.0 04/12/2014 1521    BMET    Component Value Date/Time   NA 137 07/07/2014 0355   K 5.2* 07/07/2014 0355   CL 106 07/07/2014 0355   CO2 25 07/07/2014 0355   GLUCOSE 139* 07/07/2014 0355   BUN 17 07/07/2014 0355   CREATININE 1.23* 07/07/2014 0355   CALCIUM 9.0 07/07/2014 0355   GFRNONAA 42* 07/07/2014 0355   GFRAA 48* 07/07/2014 0355    INR    Component Value Date/Time   INR 0.96 07/03/2014 2143     Intake/Output Summary (Last 24 hours) at 07/08/14 0751 Last data filed at 07/07/14 1736  Gross per 24 hour  Intake      0 ml  Output    400 ml  Net   -400 ml     Assessment:  74 y.o. female is s/p: Left femoral to below knee popliteal bypass with reversed ipsilateral great saphenous vein, intra op agram  2 Days Post-Op  Plan: -Keep dry gauze to left groin. Incisions are fine. Brisk left PT doppler signal. -PT/OT recommending HH PT/OT. Case management looking into insurance covering a lift chair.  -Pain adequately controlled. Patient wants to go  home.  -Will discharge home today. Follow up with Dr. Oneida Alar in 2 weeks.  -VQI: On Brilinita. Unable to tolerate lipitor. Has samples from Dr. Terrence Dupont for another statin.   Virgina Jock, PA-C [facility] Office: [phone] Pager: [phone] 07/08/2014 7:51 AM     Agree with above Restart aspirin and brilinta D/c home today with home health PT  Ruta Hinds, MD [facility] [place] Office: [phone] Pager: [phone]

## 2014-07-08 NOTE — Progress Notes (Signed)
Physical Therapy Treatment Patient Details Name: [name] MRN: [id number] DOB: 1940/08/28 Today's Date: 07/08/2014    History of Present Illness pt presents with L Fem Pop.      PT Comments    Pt progressing well with mobility, ambulated 200' with RW and supervision as well tolerating ther ex for LE strengthening and improving posture. PT will continue to follow.   Follow Up Recommendations  Home health PT;Supervision - Intermittent     Equipment Recommendations  3in1 (PT)    Recommendations for Other Services       Precautions / Restrictions Precautions Precautions: Fall Precaution Comments: pt indicates needing L hip replacement.   Restrictions Weight Bearing Restrictions: No    Mobility  Bed Mobility               General bed mobility comments: pt received sitting EOB  Transfers Overall transfer level: Needs assistance Equipment used: Rolling walker (2 wheeled) Transfers: Sit to/from Stand Sit to Stand: Supervision         General transfer comment: pt stands with increased time but no physical assist needed  Ambulation/Gait Ambulation/Gait assistance: Supervision Ambulation Distance (Feet): 200 Feet Assistive device: Rolling walker (2 wheeled) Gait Pattern/deviations: Step-through pattern;Trunk flexed Gait velocity: decreased   General Gait Details: vc's for posture, pt feeling better with ambulation than yesterday, discussed consistent use of RW for symmetrical gait pattern for managing hip pain    Stairs            Wheelchair Mobility    Modified Rankin (Stroke Patients Only)       Balance Overall balance assessment: Needs assistance Sitting-balance support: No upper extremity supported;Feet supported Sitting balance-Leahy Scale: Good Sitting balance - Comments: pt able to reach to feet to don shoes without LOB EOB   Standing balance support: No upper extremity supported Standing balance-Leahy Scale: Fair Standing balance  comment: able to stand without UE support for exercises in standing                    Cognition Arousal/Alertness: Awake/alert Behavior During Therapy: WFL for tasks assessed/performed Overall Cognitive Status: Within Functional Limits for tasks assessed                      Exercises General Exercises - Lower Extremity Ankle Circles/Pumps: AROM;Both;10 reps;Standing Long Arc Quad: PROM;Both;10 reps;Seated Heel Slides: AROM;Both;10 reps;Supine Hip ABduction/ADduction: AROM;Both;10 reps;Standing Hip Flexion/Marching: AROM;Both;10 reps;Seated Other Exercises Other Exercises: standing postural exercises Other Exercises: gave pt pt handout with pictures of exercises for home    General Comments        Pertinent Vitals/Pain Pain Assessment: No/denies pain  VSS    Home Living                      Prior Function            PT Goals (current goals can now be found in the care plan section) Acute Rehab PT Goals Patient Stated Goal: return home PT Goal Formulation: With patient Time For Goal Achievement: 07/14/14 Potential to Achieve Goals: Good Progress towards PT goals: Progressing toward goals    Frequency  Min 3X/week    PT Plan Current plan remains appropriate    Co-evaluation             End of Session Equipment Utilized During Treatment: Gait belt Activity Tolerance: Patient tolerated treatment well Patient left: in bed;with call bell/phone within reach  Time: 4360-1658 PT Time Calculation (min) (ACUTE ONLY): 29 min  Charges:  $Gait Training: 8-22 mins $Therapeutic Exercise: 8-22 mins                    G Codes:     Leighton Roach, PT  Acute Rehab Services  [phone]  [name], [name] 07/08/2014, 11:28 AM

## 2014-07-08 NOTE — Telephone Encounter (Addendum)
-----   Message from Mena Goes, RN sent at 07/08/2014  9:19 AM EST ----- Regarding: Schedule   ----- Message -----    From: Alvia [place], PA-C    Sent: 07/08/2014   8:02 AM      To: Vvs Charge Pool  S/p Left femoral to below knee popliteal bypass with reversed ipsilateral great saphenous vein, intra op agram 07/06/14  F/u with Dr. Oneida Alar in 2 weeks.  Thanks Kim   07/08/14: spoke with pt, dpm

## 2014-07-09 NOTE — Discharge Summary (Signed)
[facility] Discharge Summary  [name] [birth year] 74 y.o. female  [id number]  Admission Date: 07/03/2014  Discharge Date: 07/08/2014  Physician: Ruta Hinds, MD  Admission Diagnosis: Peripheral vascular disease with nonhealing ulcer to left foot I70.244  HPI:   This is a 74 y.o. female who presented for evaluation of nonhealing wound left first toe. She states the toe became painful approximately 3 months ago. She states she still is having some intermittent drainage but this has improved somewhat after recent drainage of the toe by Dr. Janus Molder. She states the toe is painful. She has rest pain in the left foot at night time. She has bilateral lower extremity claudication at approximately 1/2 block. The claudication symptoms or chronic. We have followed her for several years for this. We had her scheduled for a left femoral to below-knee popliteal bypass but she had a myocardial infarction just prior to this. This was approximately 2 months ago. She is currently on Brilinta and aspirin. Dr. Terrence Dupont has suggested she needs to stay on this combination for 9-12 months. She currently is not having any chest pain or shortness of breath. She does have to take Tylenol at night time for the pain in her left foot.  We have also followed her in the past for carotid artery occlusive disease. Her last duplex scan approximately 15 months ago showed less than 40% carotid stenosis bilaterally. She has also previously had abdominal aortic aneurysm repair by my partner Dr. Amedeo Plenty. 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 However, the patient refused any further intervention or followup for this at her last office visit approximately a year and a half ago. Other medical problems include diabetes, hyperlipidemia, hypertension. The patient has also had a DVT in her right leg in the past. She was Xarelto in the past. This has been  stopped.   Hospital Course:  The patient was admitted to the hospital and taken to the operating room on  07/06/2014 and underwent: Left femoral to below knee popliteal bypass with reversed ipsilateral great saphenous vein and intraoperative arteriogram.   The patient tolerated the procedure well and was transported to the PACU in  stable condition.   On POD 1, she was doing well and complained only of soreness with her leg incisions. She denied any pain to her left foot. Her incisions were healing well and she had a brisk left posterior tibial doppler signal. She was transferred to the floor.  On POD 2,she continued to do well. She had a good left PT doppler signal and incisions were clean and intact. Physical therapy recommended home health physical therapy. Her pain was adequately controlled and she was ambulating well with assistance. She was discharged home on POD 2 in good condition.    CBC    Component Value Date/Time   WBC 9.5 07/08/2014 0538   RBC 3.36* 07/08/2014 0538   HGB 9.6* 07/08/2014 0538   HCT 29.1* 07/08/2014 0538   PLT 169 07/08/2014 0538   MCV 86.6 07/08/2014 0538   MCH 28.6 07/08/2014 0538   MCHC 33.0 07/08/2014 0538   RDW 16.4* 07/08/2014 0538   LYMPHSABS 1.4 04/12/2014 1521   MONOABS 0.2 04/12/2014 1521   EOSABS 0.1 04/12/2014 1521   BASOSABS 0.0 04/12/2014 1521    BMET    Component Value Date/Time   NA 137 07/07/2014 0355   K 5.2* 07/07/2014 0355   CL 106 07/07/2014 0355  CO2 25 07/07/2014 0355   GLUCOSE 139* 07/07/2014 0355   BUN 17 07/07/2014 0355   CREATININE 1.23* 07/07/2014 0355   CALCIUM 9.0 07/07/2014 0355   GFRNONAA 42* 07/07/2014 0355   GFRAA 48* 07/07/2014 0355     Discharge Instructions:   The patient is discharged to home with extensive instructions on wound care and progressive ambulation.  They are instructed not to drive or perform any heavy lifting until returning to see the physician in his office.  Discharge Instructions     Call MD for:  redness, tenderness, or signs of infection (pain, swelling, bleeding, redness, odor or green/yellow discharge around incision site)    Complete by:  As directed      Call MD for:  severe or increased pain, loss or decreased feeling  in affected limb(s)    Complete by:  As directed      Call MD for:  temperature >100.5    Complete by:  As directed      Discharge wound care:    Complete by:  As directed   Wash the groin wound with soap and water daily and pat dry. (No tub bath-only shower)  Then put a dry gauze or washcloth there to keep this area dry daily and as needed.  Do not use Vaseline or neosporin on your incisions.  Only use soap and water on your incisions and then protect and keep dry.     Driving Restrictions    Complete by:  As directed   No driving for 2 weeks     Increase activity slowly    Complete by:  As directed   Walk with assistance use walker as needed. Keep legs elevated above level of the heart when resting.     Lifting restrictions    Complete by:  As directed   No lifting above 10 pounds until cleared by Dr. Terrence Dupont.     Resume previous diet    Complete by:  As directed            Discharge Diagnosis:  Peripheral vascular disease with nonhealing ulcer to left foot I70.244  Secondary Diagnosis: Patient Active Problem List   Diagnosis Date Noted  . Acute myocardial infarction of inferolateral wall 04/12/2014  . Numbness 03/18/2014  . TIA (transient ischemic attack) 03/18/2014  . DM (diabetes mellitus) type II uncontrolled, periph vascular disorder 03/18/2014  . Hyperlipidemia   . Hypertension   . PVD (peripheral vascular disease) 03/05/2014  . Transient cerebral ischemia 08/01/2012  . Atherosclerosis of native arteries of extremity with intermittent claudication 10/19/2011  . Unspecified disorders of arteries and arterioles 10/19/2011  . PAD (peripheral artery disease) 04/13/2011   Past Medical History  Diagnosis Date  . Peripheral  vascular disease   . Diabetes mellitus   . Hyperlipidemia   . Hypertension   . TIA (transient ischemic attack)   . Arthritis     osteoarthritis  . Aneurysm   . Stroke   . AAA (abdominal aortic aneurysm)   . Myocardial infarction        Medication List    STOP taking these medications        atorvastatin 80 MG tablet  Commonly known as:  LIPITOR      TAKE these medications        acetaminophen 500 MG tablet  Commonly known as:  TYLENOL  Take 1,000 mg by mouth every 6 (six) hours as needed for moderate pain.     amLODipine 5 MG  tablet  Commonly known as:  NORVASC  Take 5 mg by mouth daily.     aspirin 81 MG tablet  Take 81 mg by mouth daily.     bisacodyl 5 MG EC tablet  Commonly known as:  DULCOLAX  Take 1 tablet (5 mg total) by mouth daily as needed for moderate constipation.     esomeprazole 40 MG capsule  Commonly known as:  NEXIUM  Take 40 mg by mouth daily as needed (acid reflux).     ferrous sulfate 325 (65 FE) MG tablet  Take 1 tablet (325 mg total) by mouth 3 (three) times daily with meals.     Geritol Liqd  Take 5 mLs by mouth daily.     glimepiride 2 MG tablet  Commonly known as:  AMARYL  Take 2-3 mg by mouth 2 (two) times daily. Take 3 mg by mouth in the morning and take 2 mg by mouth in the afternoon     isosorbide mononitrate 60 MG 24 hr tablet  Commonly known as:  IMDUR  Take 1 tablet (60 mg total) by mouth daily.     metoprolol succinate 50 MG 24 hr tablet  Commonly known as:  TOPROL-XL  Take 50 mg by mouth daily.     neomycin-bacitracin-polymyxin ointment  Commonly known as:  NEOSPORIN  Apply 1 application topically 2 (two) times daily.     nitroGLYCERIN 0.4 MG SL tablet  Commonly known as:  NITROSTAT  Place 0.4 mg under the tongue every 5 (five) minutes as needed for chest pain.     oxyCODONE 5 MG immediate release tablet  Commonly known as:  Oxy IR/ROXICODONE  Take 1-2 tablets (5-10 mg total) by mouth every 6 (six) hours as  needed for moderate pain.     povidone-iodine 10 % external solution  Commonly known as:  BETADINE  Apply 1 application topically 2 (two) times daily.     sulfamethoxazole-trimethoprim 800-160 MG per tablet  Commonly known as:  BACTRIM DS  Take 20 tablets by mouth 2 (two) times daily.     ticagrelor 90 MG Tabs tablet  Commonly known as:  BRILINTA  Take 1 tablet (90 mg total) by mouth 2 (two) times daily.     vitamin B-12 50 MCG tablet  Commonly known as:  CYANOCOBALAMIN  Take 50 mcg by mouth daily.        Roxicodone #30 No Refill  Disposition: Home with home health PT and OT  Patient's condition: is Good  Follow up: 1. Dr. Oneida Alar  in 2 weeks   Virgina Jock, PA-C [facility] [phone] 07/09/2014  10:17 AM  - For VQI Registry use --- Instructions: Press F2 to tab through selections.  Delete question if not applicable.   Post-op:  Wound infection: No  Graft infection: No   Transfusion: No   New Arrhythmia: No Ipsilateral amputation: No, [ ]  Minor, [ ]  BKA, [ ]  AKA Discharge patency: [x]  Primary, [ ]  Primary assisted, [ ]  Secondary, [ ]  Occluded Patency judged by: [x]  Dopper only, [ ]  Palpable graft pulse, [ ]  Palpable distal pulse, [ ]  ABI inc. > 0.15, [ ]  Duplex D/C Ambulatory Status: Ambulatory with Assistance  Complications: MI: No, [ ]  Troponin only, [ ]  EKG or Clinical CHF: No Resp failure:No, [ ]  Pneumonia, [ ]  Ventilator Chg in renal function: No, [ ]  Inc. Cr > 0.5, [ ]  Temp. Dialysis, [ ]  Permanent dialysis Stroke: No, [ ]  Minor, [ ]  Major Return  to OR: No  Reason for return to OR: [ ]  Bleeding, [ ]  Infection, [ ]  Thrombosis, [ ]  Revision  Discharge medications: Statin use:  yes ASA use:  yes Plavix use:  No, on brilinta Beta blocker use: yes Coumadin use: no

## 2014-07-17 ENCOUNTER — Telehealth: Payer: Self-pay | Admitting: *Deleted

## 2014-07-17 NOTE — Telephone Encounter (Signed)
Mel Almond, Mrs. Triplett's home health nurse, called to report that she has a small area (approx. 3 cm) of firmness along her mid-thigh incision (S/P left fem-pop Bypass by Dr. Oneida Alar on 07-06-14). She afebrile, has no pain, drainage or wound dehiscence at any of her incisions. She reports that the area is not hard or raised. Her leg is warm, pink and she has palpable pulses according to the nurse. I reviewed the S/S of DVT with the nurse and she will instruct the patient to let us know if any occur. Her next appt with Dr. Oneida Alar is on 07-30-14.

## 2014-07-22 ENCOUNTER — Telehealth: Payer: Self-pay

## 2014-07-22 NOTE — Telephone Encounter (Signed)
Rec'd. voice message from pt's [facility] RN @ 4:55 PM 2/23.  Reported pt. c/o increased difficulty walking, due to pain in her right knee. Reported there was swelling in the right knee region.  Attempted to call [facility] RN this AM; left vm that nurse will call pt. To discuss her symptoms.  Phone call to pt.  Questioned about symptoms with her right leg.  Reported she woke up on 2/22 with soreness and redness in the inner aspect of right knee.  Stated there is no swelling of the lower right leg or foot.  Stated "there is swelling in the right knee, and it is very painful to walk on."  Related she has a hx. of arthritis in her knees.  Questioned about status of left leg.  Reported her left leg incision "is dry, and it looks like it is healing.  Stated that there is swelling of the left foot and lower leg.  Encouraged to elevate her legs several times/day above level of heart, with gentle bend in the knee.  Will discuss symptoms of right leg with NP in office, and call pt. With any recommendations.

## 2014-07-22 NOTE — Telephone Encounter (Signed)
Discussed pt's. symptoms with Dr. Kellie Simmering, on call.  Recommended pt. should notify her PCP to further evaluate her right knee pain/ inflammation.  Ret'd call to pt.  Advised of Dr. Evelena Leyden recommendations.  Stated she is doing better, since taking ES Tylenol.  Agreed to contact her PCP if symptoms with right knee worsen or persist.

## 2014-07-29 ENCOUNTER — Encounter: Payer: Self-pay | Admitting: Vascular Surgery

## 2014-07-30 ENCOUNTER — Encounter: Payer: Self-pay | Admitting: Vascular Surgery

## 2014-07-30 ENCOUNTER — Ambulatory Visit (INDEPENDENT_AMBULATORY_CARE_PROVIDER_SITE_OTHER): Payer: Self-pay | Admitting: Vascular Surgery

## 2014-07-30 VITALS — BP 142/74 | HR 81 | Temp 98.0°F | Resp 18 | Ht 67.5 in | Wt 169.0 lb

## 2014-07-30 DIAGNOSIS — I739 Peripheral vascular disease, unspecified: Secondary | ICD-10-CM

## 2014-07-30 DIAGNOSIS — Z48812 Encounter for surgical aftercare following surgery on the circulatory system: Secondary | ICD-10-CM

## 2014-07-30 NOTE — Progress Notes (Signed)
Patient is a 74 year old female who returns for postoperative follow-up today. She underwent a left femoral to below-knee popliteal bypass with reversed greater saphenous vein on 07/06/2014. This was done for a nonhealing ulcer on the left first toe. She also had claudication in the left leg. She states her claudication symptoms have completely resolved. Her left first toe is completely healed. She denies any incisional drainage. She does complain of several firm areas in her left inner thigh area.  She had one vessel runoff at the time of completion arteriogram.  Physical exam:  Filed Vitals:   07/30/14 1046  BP: 142/74  Pulse: 81  Temp: 98 F (36.7 C)  TempSrc: Oral  Resp: 18  Height: 5' 7.5" (1.715 m)  Weight: 169 lb (76.658 kg)  SpO2: 98%    Left lower extremity: Well-healed left groin incision all incisions in the left leg healing well staples were removed today trace edema left lower extremity no pedal pulses palpable left popliteal pulse is palpable. 3 cm subcutaneous mass beneath 2 of her saphenectomy incisions in the thigh no erythema minimal tenderness  Assessment: Doing well status post left femoral to below-knee popliteal bypass with a patent bypass graft and a heel toe with resolved claudication symptoms.  The patient has a history of minimal carotid disease. Her last carotid duplex exam was in 2014 and showed less than 40% stenosis bilaterally. She also had an exam several years prior to this. I do not believe she needs further carotid plaque scans unless she develops any symptoms. Of note she has also had repair of an abdominal aortic aneurysm by my partner Dr. Amedeo Plenty years ago. She has had no problems from this. She does have a history of an aberrant left subclavian artery with no significant changes in this in the last several years. However, she does not really want any intervention for this.  Plan: Follow-up May 2016 for graft duplex and ABIs.  Ruta Hinds, MD [facility] [place] Office: [phone] Pager: [phone]

## 2014-07-30 NOTE — Addendum Note (Signed)
Addended by: Mena Goes on: 07/30/2014 02:33 PM   Modules accepted: Orders

## 2014-10-01 ENCOUNTER — Ambulatory Visit: Payer: Medicare Other | Admitting: Vascular Surgery

## 2014-10-01 ENCOUNTER — Encounter (HOSPITAL_COMMUNITY): Payer: Medicare Other

## 2014-10-01 ENCOUNTER — Other Ambulatory Visit (HOSPITAL_COMMUNITY): Payer: Medicare Other

## 2014-10-13 ENCOUNTER — Encounter: Payer: Self-pay | Admitting: Vascular Surgery

## 2014-10-14 ENCOUNTER — Other Ambulatory Visit: Payer: Self-pay | Admitting: Vascular Surgery

## 2014-10-14 DIAGNOSIS — I739 Peripheral vascular disease, unspecified: Secondary | ICD-10-CM

## 2014-10-15 ENCOUNTER — Ambulatory Visit: Payer: Medicare Other | Admitting: Vascular Surgery

## 2014-10-15 ENCOUNTER — Encounter (HOSPITAL_COMMUNITY): Payer: Medicare Other

## 2014-10-15 ENCOUNTER — Other Ambulatory Visit (HOSPITAL_COMMUNITY): Payer: Medicare Other

## 2014-10-29 ENCOUNTER — Ambulatory Visit: Payer: Medicare Other | Admitting: Vascular Surgery

## 2014-10-29 ENCOUNTER — Encounter (HOSPITAL_COMMUNITY): Payer: Medicare Other

## 2014-10-29 ENCOUNTER — Other Ambulatory Visit (HOSPITAL_COMMUNITY): Payer: Medicare Other

## 2014-11-02 ENCOUNTER — Encounter: Payer: Self-pay | Admitting: Vascular Surgery

## 2014-11-03 ENCOUNTER — Encounter: Payer: Self-pay | Admitting: Vascular Surgery

## 2014-11-04 ENCOUNTER — Encounter (HOSPITAL_COMMUNITY): Payer: Medicare Other

## 2014-11-04 ENCOUNTER — Ambulatory Visit: Payer: Medicare Other | Admitting: Vascular Surgery

## 2014-11-04 ENCOUNTER — Other Ambulatory Visit (HOSPITAL_COMMUNITY): Payer: Medicare Other

## 2014-12-08 ENCOUNTER — Encounter: Payer: Self-pay | Admitting: Vascular Surgery

## 2014-12-10 ENCOUNTER — Ambulatory Visit: Payer: Medicare Other | Admitting: Vascular Surgery

## 2014-12-10 ENCOUNTER — Encounter (HOSPITAL_COMMUNITY): Payer: Medicare Other

## 2014-12-10 ENCOUNTER — Inpatient Hospital Stay (HOSPITAL_COMMUNITY): Admission: RE | Admit: 2014-12-10 | Payer: Medicare Other | Source: Ambulatory Visit

## 2014-12-14 ENCOUNTER — Other Ambulatory Visit: Payer: Self-pay | Admitting: Cardiology

## 2014-12-14 DIAGNOSIS — R079 Chest pain, unspecified: Secondary | ICD-10-CM

## 2014-12-18 ENCOUNTER — Ambulatory Visit (HOSPITAL_COMMUNITY): Payer: Medicare Other

## 2014-12-18 ENCOUNTER — Ambulatory Visit (HOSPITAL_COMMUNITY): Admission: RE | Admit: 2014-12-18 | Payer: Medicare Other | Source: Ambulatory Visit

## 2014-12-21 ENCOUNTER — Encounter: Payer: Self-pay | Admitting: Vascular Surgery

## 2014-12-24 ENCOUNTER — Encounter: Payer: Self-pay | Admitting: Vascular Surgery

## 2014-12-24 ENCOUNTER — Encounter (INDEPENDENT_AMBULATORY_CARE_PROVIDER_SITE_OTHER): Payer: Self-pay

## 2014-12-24 ENCOUNTER — Ambulatory Visit (INDEPENDENT_AMBULATORY_CARE_PROVIDER_SITE_OTHER)
Admission: RE | Admit: 2014-12-24 | Discharge: 2014-12-24 | Disposition: A | Discharge status: Home / Self Care | Payer: Medicare Other | Source: Ambulatory Visit | Attending: Vascular Surgery | Admitting: Vascular Surgery

## 2014-12-24 ENCOUNTER — Ambulatory Visit (INDEPENDENT_AMBULATORY_CARE_PROVIDER_SITE_OTHER): Payer: Medicare Other | Admitting: Vascular Surgery

## 2014-12-24 ENCOUNTER — Ambulatory Visit (HOSPITAL_COMMUNITY)
Admission: RE | Admit: 2014-12-24 | Discharge: 2014-12-24 | Disposition: A | Discharge status: Home / Self Care | Payer: Medicare Other | Source: Ambulatory Visit | Attending: Vascular Surgery | Admitting: Vascular Surgery

## 2014-12-24 VITALS — BP 184/90 | HR 74 | Temp 98.0°F | Resp 16 | Ht 67.5 in | Wt 167.0 lb

## 2014-12-24 DIAGNOSIS — Z48812 Encounter for surgical aftercare following surgery on the circulatory system: Secondary | ICD-10-CM

## 2014-12-24 DIAGNOSIS — I739 Peripheral vascular disease, unspecified: Secondary | ICD-10-CM | POA: Diagnosis present

## 2014-12-24 NOTE — Addendum Note (Signed)
Addended by: Mena Goes on: 12/24/2014 05:01 PM   Modules accepted: Orders

## 2014-12-24 NOTE — Progress Notes (Signed)
Patient is a 74 year old female who returns for follow-up today. She underwent a left femoral to below-knee popliteal bypass with reversed greater saphenous vein on 07/06/2014. This was done for a nonhealing ulcer on the left first toe. She also had claudication in the left leg. She states her claudication symptoms have completely resolved. However, she is having left hip pain. She is currently being evaluated by orthopedics for this. Her left first toe is completely healed.  She had one vessel runoff at the time of completion arteriogram.  Physical exam:    Filed Vitals:   12/24/14 1406 12/24/14 1409  BP: 182/98 184/90  Pulse: 74 74  Temp: 98 F (36.7 C)   TempSrc: Oral   Resp: 16   Height: 5' 7.5" (1.715 m)   Weight: 167 lb (75.751 kg)   SpO2: 98%     Left lower extremity: Well-healed left groin incision all incisions in the left leg healing well staples were removed today trace edema left lower extremity no pedal pulses palpable left popliteal pulse is palpable.   Data: Patient had arterial duplex exam today which showed no narrowing of her bypass graft. She does have some residual occlusive disease in the popliteal artery below the bypass. However velocities in this area were in the low 300s. Her ABIs today on the left was 0.99 right was 0.62  Assessment: Doing well status post left femoral to below-knee popliteal bypass with a patent bypass graft and a heel toe with resolved claudication symptoms.  The patient has a history of minimal carotid disease. Her last carotid duplex exam was in 2014 and showed less than 40% stenosis bilaterally. She also had an exam several years prior to this. I do not believe she needs further carotid scans unless she develops any symptoms. Of note she has also had repair of an abdominal aortic aneurysm by my partner Dr. Amedeo Plenty years ago. She has had no problems from this. She does have a history of an aberrant left subclavian artery with no significant changes  in this in the last several years. However, she does not really want any intervention for this.  Plan: Follow-up November 2016 for graft duplex and ABIs.  Ruta Hinds, MD [facility] [place] Office: [phone] Pager: [phone]

## 2015-01-28 DIAGNOSIS — I639 Cerebral infarction, unspecified: Secondary | ICD-10-CM

## 2015-01-28 HISTORY — DX: Cerebral infarction, unspecified: I63.9

## 2015-02-05 ENCOUNTER — Encounter (HOSPITAL_COMMUNITY)
Admission: RE | Admit: 2015-02-05 | Discharge: 2015-02-05 | Disposition: A | Discharge status: Home / Self Care | Payer: Medicare Other | Source: Ambulatory Visit | Attending: Cardiology | Admitting: Cardiology

## 2015-02-05 DIAGNOSIS — R079 Chest pain, unspecified: Secondary | ICD-10-CM | POA: Insufficient documentation

## 2015-02-05 MED ORDER — REGADENOSON 0.4 MG/5ML IV SOLN
INTRAVENOUS | Status: AC
  Administered 2015-02-05: 0.4 mg
  Filled 2015-02-05: qty 5

## 2015-02-05 MED ORDER — TECHNETIUM TC 99M SESTAMIBI GENERIC - CARDIOLITE
10.0000 | Freq: Once | INTRAVENOUS | Status: AC | PRN
  Administered 2015-02-05: 10 via INTRAVENOUS

## 2015-02-05 MED ORDER — TECHNETIUM TC 99M SESTAMIBI GENERIC - CARDIOLITE
30.0000 | Freq: Once | INTRAVENOUS | Status: AC | PRN
  Administered 2015-02-05: 30 via INTRAVENOUS

## 2015-02-05 MED ORDER — REGADENOSON 0.4 MG/5ML IV SOLN: 0.4000 mg | Freq: Once | INTRAVENOUS | Status: DC

## 2015-02-18 ENCOUNTER — Observation Stay (HOSPITAL_COMMUNITY): Payer: Medicare Other

## 2015-02-18 ENCOUNTER — Inpatient Hospital Stay (HOSPITAL_COMMUNITY)
Admission: AD | Admit: 2015-02-18 | Discharge: 2015-02-19 | DRG: 065 | Disposition: A | Discharge status: Home Health | Payer: Medicare Other | Source: Ambulatory Visit | Attending: Cardiovascular Disease | Admitting: Cardiovascular Disease

## 2015-02-18 DIAGNOSIS — I6523 Occlusion and stenosis of bilateral carotid arteries: Secondary | ICD-10-CM | POA: Diagnosis present

## 2015-02-18 DIAGNOSIS — E1165 Type 2 diabetes mellitus with hyperglycemia: Secondary | ICD-10-CM | POA: Diagnosis present

## 2015-02-18 DIAGNOSIS — Z96652 Presence of left artificial knee joint: Secondary | ICD-10-CM | POA: Diagnosis present

## 2015-02-18 DIAGNOSIS — I10 Essential (primary) hypertension: Secondary | ICD-10-CM | POA: Diagnosis present

## 2015-02-18 DIAGNOSIS — Z9889 Other specified postprocedural states: Secondary | ICD-10-CM

## 2015-02-18 DIAGNOSIS — I714 Abdominal aortic aneurysm, without rupture: Secondary | ICD-10-CM | POA: Diagnosis present

## 2015-02-18 DIAGNOSIS — M1990 Unspecified osteoarthritis, unspecified site: Secondary | ICD-10-CM | POA: Diagnosis present

## 2015-02-18 DIAGNOSIS — R29810 Facial weakness: Secondary | ICD-10-CM | POA: Diagnosis present

## 2015-02-18 DIAGNOSIS — I63311 Cerebral infarction due to thrombosis of right middle cerebral artery: Secondary | ICD-10-CM | POA: Diagnosis not present

## 2015-02-18 DIAGNOSIS — Z86718 Personal history of other venous thrombosis and embolism: Secondary | ICD-10-CM

## 2015-02-18 DIAGNOSIS — Z888 Allergy status to other drugs, medicaments and biological substances status: Secondary | ICD-10-CM

## 2015-02-18 DIAGNOSIS — Z880 Allergy status to penicillin: Secondary | ICD-10-CM

## 2015-02-18 DIAGNOSIS — I2510 Atherosclerotic heart disease of native coronary artery without angina pectoris: Secondary | ICD-10-CM | POA: Diagnosis present

## 2015-02-18 DIAGNOSIS — I739 Peripheral vascular disease, unspecified: Secondary | ICD-10-CM | POA: Diagnosis present

## 2015-02-18 DIAGNOSIS — G459 Transient cerebral ischemic attack, unspecified: Secondary | ICD-10-CM | POA: Diagnosis present

## 2015-02-18 DIAGNOSIS — F17210 Nicotine dependence, cigarettes, uncomplicated: Secondary | ICD-10-CM | POA: Diagnosis present

## 2015-02-18 DIAGNOSIS — I6503 Occlusion and stenosis of bilateral vertebral arteries: Secondary | ICD-10-CM | POA: Diagnosis present

## 2015-02-18 DIAGNOSIS — J8410 Pulmonary fibrosis, unspecified: Secondary | ICD-10-CM | POA: Diagnosis present

## 2015-02-18 DIAGNOSIS — E785 Hyperlipidemia, unspecified: Secondary | ICD-10-CM | POA: Insufficient documentation

## 2015-02-18 DIAGNOSIS — Z955 Presence of coronary angioplasty implant and graft: Secondary | ICD-10-CM

## 2015-02-18 DIAGNOSIS — Z90710 Acquired absence of both cervix and uterus: Secondary | ICD-10-CM

## 2015-02-18 DIAGNOSIS — Z8249 Family history of ischemic heart disease and other diseases of the circulatory system: Secondary | ICD-10-CM

## 2015-02-18 DIAGNOSIS — E1351 Other specified diabetes mellitus with diabetic peripheral angiopathy without gangrene: Secondary | ICD-10-CM | POA: Diagnosis present

## 2015-02-18 DIAGNOSIS — I639 Cerebral infarction, unspecified: Secondary | ICD-10-CM | POA: Diagnosis not present

## 2015-02-18 DIAGNOSIS — E1151 Type 2 diabetes mellitus with diabetic peripheral angiopathy without gangrene: Secondary | ICD-10-CM | POA: Diagnosis present

## 2015-02-18 DIAGNOSIS — G8194 Hemiplegia, unspecified affecting left nondominant side: Secondary | ICD-10-CM | POA: Diagnosis present

## 2015-02-18 DIAGNOSIS — Z95820 Peripheral vascular angioplasty status with implants and grafts: Secondary | ICD-10-CM

## 2015-02-18 DIAGNOSIS — Z885 Allergy status to narcotic agent status: Secondary | ICD-10-CM

## 2015-02-18 DIAGNOSIS — M25552 Pain in left hip: Secondary | ICD-10-CM | POA: Diagnosis present

## 2015-02-18 DIAGNOSIS — E780 Pure hypercholesterolemia: Secondary | ICD-10-CM | POA: Diagnosis present

## 2015-02-18 DIAGNOSIS — Z7982 Long term (current) use of aspirin: Secondary | ICD-10-CM

## 2015-02-18 DIAGNOSIS — I252 Old myocardial infarction: Secondary | ICD-10-CM

## 2015-02-18 DIAGNOSIS — R471 Dysarthria and anarthria: Secondary | ICD-10-CM | POA: Diagnosis present

## 2015-02-18 DIAGNOSIS — Z833 Family history of diabetes mellitus: Secondary | ICD-10-CM

## 2015-02-18 DIAGNOSIS — Z79899 Other long term (current) drug therapy: Secondary | ICD-10-CM

## 2015-02-18 LAB — CBC
HCT: 36.9 % (ref 36.0–46.0)
Hemoglobin: 12.1 g/dL (ref 12.0–15.0)
MCH: 29 pg (ref 26.0–34.0)
MCHC: 32.8 g/dL (ref 30.0–36.0)
MCV: 88.5 fL (ref 78.0–100.0)
PLATELETS: 206 10*3/uL (ref 150–400)
RBC: 4.17 MIL/uL (ref 3.87–5.11)
RDW: 15.1 % (ref 11.5–15.5)
WBC: 7.8 10*3/uL (ref 4.0–10.5)

## 2015-02-18 LAB — COMPREHENSIVE METABOLIC PANEL
ALT: 7 U/L — AB (ref 14–54)
AST: 13 U/L — ABNORMAL LOW (ref 15–41)
Albumin: 3.5 g/dL (ref 3.5–5.0)
Alkaline Phosphatase: 49 U/L (ref 38–126)
Anion gap: 9 (ref 5–15)
BUN: 9 mg/dL (ref 6–20)
CHLORIDE: 106 mmol/L (ref 101–111)
CO2: 23 mmol/L (ref 22–32)
CREATININE: 1.15 mg/dL — AB (ref 0.44–1.00)
Calcium: 9.7 mg/dL (ref 8.9–10.3)
GFR, EST AFRICAN AMERICAN: 53 mL/min — AB (ref >60)
GFR, EST NON AFRICAN AMERICAN: 45 mL/min — AB (ref >60)
Glucose, Bld: 110 mg/dL — ABNORMAL HIGH (ref 65–99)
POTASSIUM: 4.1 mmol/L (ref 3.5–5.1)
Sodium: 138 mmol/L (ref 135–145)
Total Bilirubin: 0.6 mg/dL (ref 0.3–1.2)
Total Protein: 7.6 g/dL (ref 6.5–8.1)

## 2015-02-18 LAB — GLUCOSE, CAPILLARY
Glucose-Capillary: 90 mg/dL (ref 65–99)
Glucose-Capillary: 133 mg/dL — ABNORMAL HIGH (ref 65–99)

## 2015-02-18 LAB — PROTIME-INR
INR: 1.09 (ref 0.00–1.49)
PROTHROMBIN TIME: 14.3 s (ref 11.6–15.2)

## 2015-02-18 MED ORDER — HEPARIN SODIUM (PORCINE) 5000 UNIT/ML IJ SOLN
5000.0000 [IU] | Freq: Three times a day (TID) | INTRAMUSCULAR | Status: DC
  Administered 2015-02-18 – 2015-02-19 (×3): 5000 [IU] via SUBCUTANEOUS
  Filled 2015-02-18 (×5): qty 1

## 2015-02-18 MED ORDER — SENNOSIDES-DOCUSATE SODIUM 8.6-50 MG PO TABS
1.0000 | ORAL_TABLET | Freq: Every evening | ORAL | Status: DC | PRN
Start: 2015-02-18 — End: 2015-02-19

## 2015-02-18 MED ORDER — STROKE: EARLY STAGES OF RECOVERY BOOK
Freq: Once | Status: AC
  Administered 2015-02-18: 14:00:00

## 2015-02-18 NOTE — Progress Notes (Addendum)
MD paged to clarify the patient's orders.  Informed he was in a procedure and he would be by to see the patient when he was finished.  Will continue to monitor. Cori Razor, RN

## 2015-02-18 NOTE — Progress Notes (Signed)
Pt arrived to 5M07 via wheelchair.  Stroke orders in place.  Pt welcomed and settled into the room.  Pt alert and oriented, in no apparent distress.  Stroke swallow screen performed, and diet placed.  Telemetry applied and CCMD notified.  VSS.  Awaiting new orders.  Will continue to monitor. Cori Razor, RN

## 2015-02-18 NOTE — H&P (Signed)
Referring Physician:  Lakashia Collison is an 74 y.o. female.                       Chief Complaint: Speech problem and left sided weakness x 3 days  HPI: 74 year old female with several TIAs- since 2002 and strokes in past has chronic speech and left sided weakness but last 3-4 days she feels additional weakness. She thinks she had another TIA. PMH significant for hypertension, DM, II, hypercholesterolemia, peripheral vascular disease, abdominal aneurysm repair-08/2005, Left Femoral-popliteal bypass surgery-07/06/2014, tobacco use disorder, history of DVT and arthritis. Denies new vision trouble.  Past Medical History  Diagnosis Date  . Peripheral vascular disease   . Diabetes mellitus   . Hyperlipidemia   . Hypertension   . TIA (transient ischemic attack)   . Arthritis     osteoarthritis  . Aneurysm   . Stroke   . AAA (abdominal aortic aneurysm)   . Myocardial infarction       Past Surgical History  Procedure Laterality Date  . Joint replacement      left knee  . Uterine tumor    . Ventral hernia repair    . Abdominal aortic aneurysm repair    . Abdominal hysterectomy      partial  . Tubal ligation    . Back surgery    . Abdominal aortic aneurysm repair    . Abdominal aortagram N/A 03/13/2014    Procedure: ABDOMINAL AORTAGRAM;  Surgeon: Elam Dutch, MD;  Location: [facility] CATH LAB;  Service: Cardiovascular;  Laterality: N/A;  . Left heart catheterization with coronary angiogram N/A 04/12/2014    Procedure: LEFT HEART CATHETERIZATION WITH CORONARY ANGIOGRAM;  Surgeon: Clent Demark, MD;  Location: [facility] CATH LAB;  Service: Cardiovascular;  Laterality: N/A;  . Percutaneous coronary stent intervention (pci-s)  04/12/2014    Procedure: PERCUTANEOUS CORONARY STENT INTERVENTION (PCI-S);  Surgeon: Clent Demark, MD;  Location: [facility] CATH LAB;  Service: Cardiovascular;;  prox and mid RCA  . Femoral-popliteal bypass graft Left 07/06/2014    Procedure: BYPASS GRAFT FEMORAL-POPLITEAL ARTERY;   Surgeon: Elam Dutch, MD;  Location: [facility] OR;  Service: Vascular;  Laterality: Left;    Family History  Problem Relation Age of Onset  . Diabetes Mother   . Hypertension Father   . Hyperlipidemia Sister   . Hypertension Sister   . Aneurysm Sister   . Hyperlipidemia Brother   . Hypertension Brother    Social History:  reports that she has been smoking Cigarettes.  She has been smoking about 0.50 packs per day. She has never used smokeless tobacco. She reports that she does not drink alcohol or use illicit drugs.  Allergies:  Allergies  Allergen Reactions  . Codeine Other (See Comments)    Knocked her out of equilibrium. Made her world flip upside down  . Lipitor [Atorvastatin] Other (See Comments)    Severe pain/cramping in legs  . Azor [Amlodipine-Olmesartan] Swelling, Palpitations and Rash    Amlodipine alone resumed 12/2013, tolerating  . Lisinopril Cough  . Penicillins Other (See Comments)    Makes exzcema worse    Medications Prior to Admission  Medication Sig Dispense Refill  . acetaminophen (TYLENOL) 500 MG tablet Take 1,000 mg by mouth every 6 (six) hours as needed for moderate pain.    Marland Kitchen amLODipine (NORVASC) 5 MG tablet Take 5 mg by mouth daily.    Marland Kitchen aspirin 81 MG tablet Take 81 mg by mouth daily.     Marland Kitchen  bisacodyl (DULCOLAX) 5 MG EC tablet Take 1 tablet (5 mg total) by mouth daily as needed for moderate constipation. 30 tablet 0  . esomeprazole (NEXIUM) 40 MG capsule Take 40 mg by mouth daily as needed (acid reflux).    Marland Kitchen glimepiride (AMARYL) 2 MG tablet Take 2-3 mg by mouth 2 (two) times daily. Take 3 mg by mouth in the morning and take 2 mg by mouth in the afternoon    . isosorbide mononitrate (IMDUR) 60 MG 24 hr tablet Take 1 tablet (60 mg total) by mouth daily. 30 tablet 3  . metoprolol (TOPROL-XL) 50 MG 24 hr tablet Take 50 mg by mouth daily.     . nitroGLYCERIN (NITROSTAT) 0.4 MG SL tablet Place 0.4 mg under the tongue every 5 (five) minutes as needed for chest  pain.    . ticagrelor (BRILINTA) 90 MG TABS tablet Take 1 tablet (90 mg total) by mouth 2 (two) times daily. 60 tablet 11  . vitamin B-12 (CYANOCOBALAMIN) 50 MCG tablet Take 50 mcg by mouth daily.      No results found for this or any previous visit (from the past 48 hour(s)). No results found.  Review Of Systems Constitutional: Negative for fever and chills.  HENT: Negative for hearing loss.  Eyes: Negative for double vision and photophobia.  Respiratory: Positive for shortness of breath. Negative for cough, hemoptysis and sputum production.  Cardiovascular: Positive for chest pain. Negative for palpitations, orthopnea. Positive for AAA and PVD  Gastrointestinal: Negative for nausea.  Genitourinary: Negative for dysuria and urgency.  Neurological: Negative for dizziness, tingling and headaches.   There were no vitals taken for this visit. Constitutional: She is well built and averagely nourished.  HENT: Head: Normocephalic and atraumatic. Brown eyes, Conjunctivae are normal. Right and Left eye exhibits no discharge. No scleral icterus.  Neck:  Neck supple. No JVD present. No tracheal deviation present. No thyromegaly present.  Cardiovascular: Normal rate and regular rhythm.  Respiratory: Effort normal and breath sounds normal. No respiratory distress. She has no wheezes. She has no rales.  GI: Soft. Bowel sounds are normal. She exhibits no distension. There is no tenderness.  Musculoskeletal: She exhibits no edema or tenderness.  Neurological: She is alert and oriented to person, place, and time. Left sided weakness. Thick speech.  Assessment/Plan TIA Old stroke Hypertension DM, II Hyperlipidemia PVD S/P AAA repair Tobacco use disorder Arthritis H/O DVT CAD S/P stent in RCA-03/2014  Place in observation MRI/MRA of brain followed by neurology consult Home medications.  Birdie Riddle, MD  02/18/2015, 11:51 AM

## 2015-02-18 NOTE — Consult Note (Signed)
Referring Physician: Dr Doylene Canard    Chief Complaint: worsening dysarthria and left hemiparesis x 3 days  HPI:                                                                                                                                         [name] is an 74 y.o. 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 Patient indicated that 3 days ago she realized that " there was something wrong" because she was feeling weak, couldn't hold things in her hands, her speech became very slurred, and she was not able to drink or eat without food or water coning down the left side of her mouth. Presently, she feels that she is improving.  Denies associated HA, vertigo, double vision, difficulty swallowing, confusion, or visual impairment. Takes ASA 81 mg daily. Her symptoms prompted MRI/MRA brain that I reviewed independently and demonstrate and acute infarct involving the right corona radiata and no evidence of major intracranial arterial occlusion or flow-limiting proximal stenosis, mild bilateral ICA stenosis, moderate bilateral vertebral artery stenosis, and moderate right P2 PCA stenosis respectively. Date last known well: 02/15/15  Time last known well: uncertain tPA Given: no, late presentation   Past Medical History  Diagnosis Date  . Peripheral vascular disease   . Diabetes mellitus   . Hyperlipidemia   . Hypertension   . TIA (transient ischemic attack)   . Arthritis     osteoarthritis  . Aneurysm   . Stroke   . AAA (abdominal aortic aneurysm)   . Myocardial infarction     Past Surgical History  Procedure Laterality Date  . Joint replacement      left knee  . Uterine tumor    . Ventral hernia repair    .  Abdominal aortic aneurysm repair    . Abdominal hysterectomy      partial  . Tubal ligation    . Back surgery    . Abdominal aortic aneurysm repair    . Abdominal aortagram N/A 03/13/2014    Procedure: ABDOMINAL AORTAGRAM;  Surgeon: Elam Dutch, MD;  Location: [facility] CATH LAB;  Service: Cardiovascular;  Laterality: N/A;  . Left heart catheterization with coronary angiogram N/A 04/12/2014    Procedure: LEFT HEART CATHETERIZATION WITH CORONARY ANGIOGRAM;  Surgeon: Clent Demark, MD;  Location: [facility] CATH LAB;  Service: Cardiovascular;  Laterality: N/A;  . Percutaneous coronary stent intervention (pci-s)  04/12/2014    Procedure: PERCUTANEOUS CORONARY STENT INTERVENTION (PCI-S);  Surgeon: Clent Demark, MD;  Location: [facility] CATH LAB;  Service: Cardiovascular;;  prox and mid RCA  . Femoral-popliteal bypass graft Left 07/06/2014    Procedure: BYPASS GRAFT  FEMORAL-POPLITEAL ARTERY;  Surgeon: Elam Dutch, MD;  Location: [facility] OR;  Service: Vascular;  Laterality: Left;    Family History  Problem Relation Age of Onset  . Diabetes Mother   . Hypertension Father   . Hyperlipidemia Sister   . Hypertension Sister   . Aneurysm Sister   . Hyperlipidemia Brother   . Hypertension Brother    Social History:  reports that she has been smoking Cigarettes.  She has been smoking about 0.50 packs per day. She has never used smokeless tobacco. She reports that she does not drink alcohol or use illicit drugs. Family history: no MS, epilepsy, brain tumor, or brain aneurysm Allergies:  Allergies  Allergen Reactions  . Codeine Other (See Comments)    Knocked her out of equilibrium. Made her world flip upside down  . Lipitor [Atorvastatin] Other (See Comments)    Severe pain/cramping in legs  . Azor [Amlodipine-Olmesartan] Swelling, Palpitations and Rash    Amlodipine alone resumed 12/2013, tolerating  . Lisinopril Cough  . Penicillins Other (See Comments)    Makes exzcema worse    Medications:                                                                                                                            Scheduled: . heparin  5,000 Units Subcutaneous 3 times per day    ROS:                                                                                                                                       History obtained from chart review and the patient  General ROS: negative for - chills, fatigue, fever, night sweats, weight gain or weight loss Psychological ROS: negative for - behavioral disorder, hallucinations, memory difficulties, mood swings or suicidal ideation Ophthalmic ROS: negative for - blurry vision, double vision, eye pain or loss of vision ENT ROS: negative for - epistaxis, nasal discharge, oral lesions, sore throat, tinnitus or vertigo Allergy and Immunology ROS: negative for - hives or itchy/watery eyes Hematological and Lymphatic ROS: negative for - bleeding problems, bruising or swollen lymph nodes Endocrine ROS: negative for - galactorrhea, hair pattern changes, polydipsia/polyuria or temperature intolerance Respiratory ROS: negative for - cough, hemoptysis, shortness of breath or wheezing Cardiovascular ROS: negative for - chest pain, dyspnea on exertion, edema or irregular heartbeat Gastrointestinal ROS: negative for -  abdominal pain, diarrhea, hematemesis, nausea/vomiting or stool incontinence Genito-Urinary ROS: negative for - dysuria, hematuria, incontinence or urinary frequency/urgency Musculoskeletal ROS: negative for - joint swelling Neurological ROS: as noted in HPI Dermatological ROS: negative for rash and skin lesion changes Physical exam:  Constitutional: well developed, pleasant female in no apparent distress. Blood pressure 165/68, pulse 59, temperature 98.3 F (36.8 C), temperature source Oral, resp. rate 16, SpO2 100 %. Eyes: no jaundice or exophthalmos.  Head: normocephalic. Neck: supple, no bruits, no JVD. Cardiac: no murmurs. Lungs:  clear. Abdomen: soft, no tender, no mass. Extremities: no edema, clubbing, or cyanosis.  Skin: no rash Neurologic Examination:                                                                                                      General: Mental Status: Alert, oriented, thought content appropriate.  Speech fluent without evidence of aphasia.  Able to follow 3 step commands without difficulty. Cranial Nerves: II: Discs flat bilaterally; Visual fields grossly normal, pupils equal, round, reactive to light and accommodation III,IV, VI: ptosis not present, extra-ocular motions intact bilaterally V,VII: smile asymmetric due to mild left face weakness, facial light touch sensation normal bilaterally VIII: hearing normal bilaterally IX,X: uvula rises symmetrically XI: bilateral shoulder shrug XII: midline tongue extension without atrophy or fasciculations Motor: Significant for mild left arm-left drift Tone and bulk:normal tone throughout; no atrophy noted Sensory: Pinprick and light touch intact throughout, bilaterally Deep Tendon Reflexes:  1 all over Plantars: Right: downgoing   Left: upgoing Cerebellar: normal finger-to-nose,  normal heel-to-shin test Gait:  No tested due to multiple leads    Results for orders placed or performed during the hospital encounter of 02/18/15 (from the past 48 hour(s))  CBC     Status: None   Collection Time: 02/18/15 12:50 PM  Result Value Ref Range   WBC 7.8 4.0 - 10.5 K/uL   RBC 4.17 3.87 - 5.11 MIL/uL   Hemoglobin 12.1 12.0 - 15.0 g/dL   HCT 36.9 36.0 - 46.0 %   MCV 88.5 78.0 - 100.0 fL   MCH 29.0 26.0 - 34.0 pg   MCHC 32.8 30.0 - 36.0 g/dL   RDW 15.1 11.5 - 15.5 %   Platelets 206 150 - 400 K/uL  Comprehensive metabolic panel     Status: Abnormal   Collection Time: 02/18/15 12:50 PM  Result Value Ref Range   Sodium 138 135 - 145 mmol/L   Potassium 4.1 3.5 - 5.1 mmol/L   Chloride 106 101 - 111 mmol/L   CO2 23 22 - 32 mmol/L   Glucose, Bld  110 (H) 65 - 99 mg/dL   BUN 9 6 - 20 mg/dL   Creatinine, Ser 1.15 (H) 0.44 - 1.00 mg/dL   Calcium 9.7 8.9 - 10.3 mg/dL   Total Protein 7.6 6.5 - 8.1 g/dL   Albumin 3.5 3.5 - 5.0 g/dL   AST 13 (L) 15 - 41 U/L   ALT 7 (L) 14 - 54 U/L   Alkaline Phosphatase 49 38 - 126 U/L   Total Bilirubin  0.6 0.3 - 1.2 mg/dL   GFR calc non Af Amer 45 (L) >60 mL/min   GFR calc Af Amer 53 (L) >60 mL/min    Comment: (NOTE) The eGFR has been calculated using the CKD EPI equation. This calculation has not been validated in all clinical situations. eGFR's persistently <60 mL/min signify possible Chronic Kidney Disease.    Anion gap 9 5 - 15  Protime-INR     Status: None   Collection Time: 02/18/15 12:50 PM  Result Value Ref Range   Prothrombin Time 14.3 11.6 - 15.2 seconds   INR 1.09 0.00 - 1.49  Glucose, capillary     Status: None   Collection Time: 02/18/15  1:49 PM  Result Value Ref Range   Glucose-Capillary 90 65 - 99 mg/dL  Glucose, capillary     Status: Abnormal   Collection Time: 02/18/15  4:23 PM  Result Value Ref Range   Glucose-Capillary 133 (H) 65 - 99 mg/dL   Dg Chest 2 View  02/18/2015   CLINICAL DATA:  74 year old currently being admitted for possible stroke.  EXAM: CHEST  2 VIEW  COMPARISON:  07/03/2014 and earlier, including CTA chest 03/11/2014.  FINDINGS: Cardiac silhouette normal in size. Thoracic aorta tortuous and atherosclerotic, unchanged. Hilar and mediastinal contours otherwise unremarkable. Diffuse interstitial lung disease and emphysematous changes in the upper lobes, unchanged. No new pulmonary parenchymal abnormalities. No pleural effusions. No pneumothorax. Mild degenerative disc disease and spondylosis involving the mid and lower thoracic spine. Calcification within the right supraspinatus tendon at its insertion on the greater tuberosity of the right humeral head.  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 No acute  cardiopulmonary disease.   Electronically Signed   By: Evangeline Dakin M.D.   On: 02/18/2015 15:37    Assessment: 74 y.o. female with multiple risk factors for stroke including prior right subcortical infarct with residual left hemiparesis and dysarthria, admitted to the hospital due to worsening left hemiparesis and dysarthria that started 3 days ago. MRI brain revealed an acute infarct involving the right corona radiata.  Patient did not receive tpa due to late presentation. Stroke work up underway.  Patient was on aspirin 81 mg, consider switching to plavix. She will be follow by the stroke team in the morning.  Stroke Risk Factors - age, HTN, hyperlipidemia, DM, TIA, stroke.  Plan: 1. HgbA1c, fasting lipid panel 2. MRI, MRA  of the brain without contrast 3. Echocardiogram 4. Carotid dopplers 5. Prophylactic therapy-plavix 6. Risk factor modification 7. Telemetry monitoring 8. Frequent neuro checks 9. PT/OT SLP 10. NPO  Dorian Pod, MD [facility] [phone]  02/18/2015, 8:55 PM

## 2015-02-18 NOTE — Progress Notes (Addendum)
*  PRELIMINARY RESULTS* Vascular Ultrasound Carotid Duplex (Doppler) has been completed.   Findings suggest 1-39% internal carotid artery stenosis bilaterally. Vertebral arteries are patent with antegrade flow.  02/18/2015 5:28 PM Maudry Mayhew, RVT, RDCS, RDMS

## 2015-02-18 NOTE — Progress Notes (Signed)
MD paged, awaiting his arrival to see the patient.  Will continue to monitor. Cori Razor, RN

## 2015-02-19 ENCOUNTER — Observation Stay (HOSPITAL_COMMUNITY): Payer: Medicare Other

## 2015-02-19 DIAGNOSIS — E1151 Type 2 diabetes mellitus with diabetic peripheral angiopathy without gangrene: Secondary | ICD-10-CM | POA: Diagnosis present

## 2015-02-19 DIAGNOSIS — Z90710 Acquired absence of both cervix and uterus: Secondary | ICD-10-CM | POA: Diagnosis not present

## 2015-02-19 DIAGNOSIS — I63311 Cerebral infarction due to thrombosis of right middle cerebral artery: Secondary | ICD-10-CM

## 2015-02-19 DIAGNOSIS — G459 Transient cerebral ischemic attack, unspecified: Secondary | ICD-10-CM | POA: Diagnosis present

## 2015-02-19 DIAGNOSIS — I10 Essential (primary) hypertension: Secondary | ICD-10-CM

## 2015-02-19 DIAGNOSIS — Z79899 Other long term (current) drug therapy: Secondary | ICD-10-CM | POA: Diagnosis not present

## 2015-02-19 DIAGNOSIS — Z888 Allergy status to other drugs, medicaments and biological substances status: Secondary | ICD-10-CM | POA: Diagnosis not present

## 2015-02-19 DIAGNOSIS — I6503 Occlusion and stenosis of bilateral vertebral arteries: Secondary | ICD-10-CM | POA: Diagnosis present

## 2015-02-19 DIAGNOSIS — I639 Cerebral infarction, unspecified: Secondary | ICD-10-CM | POA: Diagnosis present

## 2015-02-19 DIAGNOSIS — Z8249 Family history of ischemic heart disease and other diseases of the circulatory system: Secondary | ICD-10-CM | POA: Diagnosis not present

## 2015-02-19 DIAGNOSIS — Z96652 Presence of left artificial knee joint: Secondary | ICD-10-CM | POA: Diagnosis present

## 2015-02-19 DIAGNOSIS — Z833 Family history of diabetes mellitus: Secondary | ICD-10-CM | POA: Diagnosis not present

## 2015-02-19 DIAGNOSIS — G8194 Hemiplegia, unspecified affecting left nondominant side: Secondary | ICD-10-CM | POA: Diagnosis present

## 2015-02-19 DIAGNOSIS — I252 Old myocardial infarction: Secondary | ICD-10-CM | POA: Diagnosis not present

## 2015-02-19 DIAGNOSIS — I739 Peripheral vascular disease, unspecified: Secondary | ICD-10-CM

## 2015-02-19 DIAGNOSIS — Z86718 Personal history of other venous thrombosis and embolism: Secondary | ICD-10-CM | POA: Diagnosis not present

## 2015-02-19 DIAGNOSIS — Z955 Presence of coronary angioplasty implant and graft: Secondary | ICD-10-CM | POA: Diagnosis not present

## 2015-02-19 DIAGNOSIS — J8410 Pulmonary fibrosis, unspecified: Secondary | ICD-10-CM | POA: Diagnosis present

## 2015-02-19 DIAGNOSIS — E785 Hyperlipidemia, unspecified: Secondary | ICD-10-CM

## 2015-02-19 DIAGNOSIS — R29810 Facial weakness: Secondary | ICD-10-CM | POA: Diagnosis present

## 2015-02-19 DIAGNOSIS — E1159 Type 2 diabetes mellitus with other circulatory complications: Secondary | ICD-10-CM | POA: Diagnosis not present

## 2015-02-19 DIAGNOSIS — Z720 Tobacco use: Secondary | ICD-10-CM

## 2015-02-19 DIAGNOSIS — Z7982 Long term (current) use of aspirin: Secondary | ICD-10-CM | POA: Diagnosis not present

## 2015-02-19 DIAGNOSIS — E780 Pure hypercholesterolemia: Secondary | ICD-10-CM | POA: Diagnosis present

## 2015-02-19 DIAGNOSIS — I2510 Atherosclerotic heart disease of native coronary artery without angina pectoris: Secondary | ICD-10-CM | POA: Diagnosis present

## 2015-02-19 DIAGNOSIS — Z9889 Other specified postprocedural states: Secondary | ICD-10-CM | POA: Diagnosis not present

## 2015-02-19 DIAGNOSIS — M1990 Unspecified osteoarthritis, unspecified site: Secondary | ICD-10-CM | POA: Diagnosis present

## 2015-02-19 DIAGNOSIS — I6523 Occlusion and stenosis of bilateral carotid arteries: Secondary | ICD-10-CM | POA: Diagnosis present

## 2015-02-19 DIAGNOSIS — Z880 Allergy status to penicillin: Secondary | ICD-10-CM | POA: Diagnosis not present

## 2015-02-19 DIAGNOSIS — Z95820 Peripheral vascular angioplasty status with implants and grafts: Secondary | ICD-10-CM | POA: Diagnosis not present

## 2015-02-19 DIAGNOSIS — M25552 Pain in left hip: Secondary | ICD-10-CM | POA: Diagnosis present

## 2015-02-19 DIAGNOSIS — F17210 Nicotine dependence, cigarettes, uncomplicated: Secondary | ICD-10-CM | POA: Diagnosis present

## 2015-02-19 DIAGNOSIS — Z885 Allergy status to narcotic agent status: Secondary | ICD-10-CM | POA: Diagnosis not present

## 2015-02-19 DIAGNOSIS — R471 Dysarthria and anarthria: Secondary | ICD-10-CM | POA: Diagnosis present

## 2015-02-19 DIAGNOSIS — E1165 Type 2 diabetes mellitus with hyperglycemia: Secondary | ICD-10-CM | POA: Diagnosis present

## 2015-02-19 DIAGNOSIS — I714 Abdominal aortic aneurysm, without rupture: Secondary | ICD-10-CM | POA: Diagnosis present

## 2015-02-19 LAB — LIPID PANEL
CHOL/HDL RATIO: 6.5 ratio
Cholesterol: 214 mg/dL — ABNORMAL HIGH (ref 0–200)
HDL: 33 mg/dL — ABNORMAL LOW (ref >40)
LDL CALC: 137 mg/dL — AB (ref 0–99)
Triglycerides: 219 mg/dL — ABNORMAL HIGH (ref <150)
VLDL: 44 mg/dL — AB (ref 0–40)

## 2015-02-19 MED ORDER — PRAVASTATIN SODIUM 20 MG PO TABS: 20.0000 mg | ORAL_TABLET | Freq: Every day | ORAL | Status: DC

## 2015-02-19 MED ORDER — PANTOPRAZOLE SODIUM 40 MG PO TBEC
40.0000 mg | DELAYED_RELEASE_TABLET | Freq: Every day | ORAL | Status: DC
  Administered 2015-02-19: 40 mg via ORAL
  Filled 2015-02-19: qty 1

## 2015-02-19 MED ORDER — METOPROLOL SUCCINATE ER 25 MG PO TB24
50.0000 mg | ORAL_TABLET | Freq: Every day | ORAL | Status: DC
  Administered 2015-02-19: 50 mg via ORAL
  Filled 2015-02-19: qty 2

## 2015-02-19 MED ORDER — TICAGRELOR 90 MG PO TABS
90.0000 mg | ORAL_TABLET | Freq: Two times a day (BID) | ORAL | Status: DC
  Administered 2015-02-19: 90 mg via ORAL
  Filled 2015-02-19 (×3): qty 1

## 2015-02-19 MED ORDER — NITROGLYCERIN 0.4 MG SL SUBL: 0.4000 mg | SUBLINGUAL_TABLET | SUBLINGUAL | Status: DC | PRN

## 2015-02-19 MED ORDER — ASPIRIN EC 81 MG PO TBEC
81.0000 mg | DELAYED_RELEASE_TABLET | Freq: Every day | ORAL | Status: DC
  Administered 2015-02-19: 81 mg via ORAL
  Filled 2015-02-19: qty 1

## 2015-02-19 MED ORDER — ISOSORBIDE MONONITRATE ER 30 MG PO TB24
60.0000 mg | ORAL_TABLET | Freq: Every day | ORAL | Status: DC
  Administered 2015-02-19: 60 mg via ORAL
  Filled 2015-02-19: qty 2

## 2015-02-19 MED ORDER — ACETAMINOPHEN 325 MG PO TABS
650.0000 mg | ORAL_TABLET | ORAL | Status: DC | PRN
  Administered 2015-02-19: 650 mg via ORAL
  Filled 2015-02-19: qty 2

## 2015-02-19 MED ORDER — PRAVASTATIN SODIUM 20 MG PO TABS
20.0000 mg | ORAL_TABLET | Freq: Every day | ORAL | Status: DC
  Administered 2015-02-19: 20 mg via ORAL
  Filled 2015-02-19: qty 1

## 2015-02-19 NOTE — Evaluation (Signed)
Physical Therapy Evaluation Patient Details Name: [name] MRN: [id number] DOB: January 07, 1941 Today's Date: 02/19/2015   History of Present Illness  Patient is a 74 y.o. 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  She was admitted due to increased left side weakness, slurred speech x 3 days.  Found to have acute right corona radiata infarct.  Clinical Impression  Patient presents with decreased mobility due to decreased strength and question inattention to left side.  Some concern as she lives alone and son and daughter work.  Patient feels strength is better than initially, but main concern is balance and left inattention needing cues to move safely around obstacles on left side.  Fall risk increased over baseline, but may be able to go home as son and daughter can provide assist over the weekend.  Patient not wanting to go to any post-acute inpatient rehab.  Will continue skilled PT in the acute setting and update d/c recommendations if improvement not seen and son and daughter unable to provide assist.    Follow Up Recommendations Supervision/Assistance - 24 hour;Home health PT    Equipment Recommendations  None recommended by PT    Recommendations for Other Services       Precautions / Restrictions Precautions Precautions: Fall Precaution Comments: decreased left side awareness Restrictions Weight Bearing Restrictions: No      Mobility  Bed Mobility               General bed mobility comments: up in chair, reports had more hip pain in the bed all night  Transfers Overall transfer level: Modified independent               General transfer comment: pushes up from chair with UE support  Ambulation/Gait Ambulation/Gait assistance: Supervision;Min  guard Ambulation Distance (Feet): 120 Feet Assistive device: Rolling walker (2 wheeled) Gait Pattern/deviations: Step-through pattern     General Gait Details: occasional assist to maneuver walker around obstacles in the hallway.  She reports can feel being pulled to left and can maneuver around with cues, question left inattention  Stairs            Wheelchair Mobility    Modified Rankin (Stroke Patients Only) Modified Rankin (Stroke Patients Only) Pre-Morbid Rankin Score: No significant disability Modified Rankin: Moderately severe disability     Balance Overall balance assessment: Needs assistance         Standing balance support: Bilateral upper extremity supported Standing balance-Leahy Scale: Poor Standing balance comment: reliant on walker for balance                             Pertinent Vitals/Pain Pain Assessment: 0-10 Pain Score: 5  Pain Location: left hip and knee  Pain Descriptors / Indicators: Aching Pain Intervention(s): Monitored during session;Patient requesting pain meds-RN notified    Home Living Family/patient expects to be discharged to:: Private residence Living Arrangements: Alone Available Help at Discharge: Family;Available PRN/intermittently Type of Home: Apartment Home Access: Level entry     Home Layout: One level Home Equipment: Walker - 4 wheels;Grab bars - tub/shower;Hand held shower head;Shower seat;Cane - single point      Prior Function Level of Independence: Independent with assistive device(s)         Comments: uses cane and  rollator, gets groceries, sometimes son or daughter help when she asks them to     Hand Dominance        Extremity/Trunk Assessment   Upper Extremity Assessment: Defer to OT evaluation           Lower Extremity Assessment: LLE deficits/detail   LLE Deficits / Details: Overall strength grossly 4/5, but hip flexion NT due to pain lifts antigravity, denies changes in  sensation  Cervical / Trunk Assessment: Kyphotic  Communication   Communication: Expressive difficulties (pt reports slurred speech, slight dyarthria)  Cognition Arousal/Alertness: Awake/alert Behavior During Therapy: WFL for tasks assessed/performed Overall Cognitive Status: Within Functional Limits for tasks assessed                      General Comments      Exercises        Assessment/Plan    PT Assessment Patient needs continued PT services  PT Diagnosis Abnormality of gait;Hemiplegia non-dominant side   PT Problem List Decreased balance;Decreased mobility;Decreased strength  PT Treatment Interventions DME instruction;Gait training;Patient/family education;Functional mobility training;Therapeutic activities;Therapeutic exercise;Balance training   PT Goals (Current goals can be found in the Care Plan section) Acute Rehab PT Goals Patient Stated Goal: To go home PT Goal Formulation: With patient Time For Goal Achievement: 02/26/15 Potential to Achieve Goals: Good    Frequency Min 4X/week   Barriers to discharge Decreased caregiver support son and daughter work, can stay with her over this weekend per her report    Co-evaluation               End of Session Equipment Utilized During Treatment: Gait belt Activity Tolerance: Patient tolerated treatment well Patient left: in chair;with call bell/phone within reach      Functional Assessment Tool Used: Clinical Judgement Functional Limitation: Mobility: Walking and moving around Mobility: Walking and Moving Around Current Status [phone]): At least 20 percent but less than 40 percent impaired, limited or restricted Mobility: Walking and Moving Around Goal Status [phone]): At least 1 percent but less than 20 percent impaired, limited or restricted    Time: 1015-1048 PT Time Calculation (min) (ACUTE ONLY): 33 min   Charges:   PT Evaluation $Initial PT Evaluation Tier I: 1 Procedure PT Treatments $Gait  Training: 8-22 mins   PT G Codes:   PT G-Codes **NOT FOR INPATIENT CLASS** Functional Assessment Tool Used: Clinical Judgement Functional Limitation: Mobility: Walking and moving around Mobility: Walking and Moving Around Current Status ([id number]): At least 20 percent but less than 40 percent impaired, limited or restricted Mobility: Walking and Moving Around Goal Status [phone]): At least 1 percent but less than 20 percent impaired, limited or restricted    [facility] 02/19/2015, 11:16 AM  Magda Kiel, PT [phone] 02/19/2015

## 2015-02-19 NOTE — Progress Notes (Signed)
STROKE TEAM PROGRESS NOTE  HPI [name] is an 74 y.o. 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 Patient indicated that 3 days ago she realized that " there was something wrong" because she was feeling weak, couldn't hold things in her hands, her speech became very slurred, and she was not able to drink or eat without food or water coning down the left side of her mouth. Presently, she feels that she is improving.  Denies associated HA, vertigo, double vision, difficulty swallowing, confusion, or visual impairment. Takes ASA 81 mg daily. Her symptoms prompted MRI/MRA brain that I reviewed independently and demonstrate and acute infarct involving the right corona radiata and no evidence of major intracranial arterial occlusion or flow-limiting proximal stenosis, mild bilateral ICA stenosis, moderate bilateral vertebral artery stenosis, and moderate right P2 PCA stenosis respectively. Date last known well: 02/15/15  Time last known well: uncertain tPA Given: no, late presentation    SUBJECTIVE (INTERVAL HISTORY) No family members present. The patient reports that she still smokes. She was strongly advised to give up cigarettes. The patient's home medications were restarted. She was also started on Pravachol. She has been on aspirin 81 mg and Brilinta for a history of coronary artery disease with stent placement 03/2014.   OBJECTIVE Temp:  [97.8 F (36.6 C)-98.7 F (37.1 C)] 97.8 F (36.6 C) (09/23 0629) Pulse Rate:  [57-65] 57 (09/23 0629) Cardiac Rhythm:  [-] Normal sinus rhythm (09/22 1944) Resp:  [16-18] 18 (09/23 0629) BP: (152-175)/(63-71) 165/69 mmHg (09/23 0629) SpO2:  [100 %] 100  % (09/23 0629)  CBC:  Recent Labs Lab 02/18/15 1250  WBC 7.8  HGB 12.1  HCT 36.9  MCV 88.5  PLT 270    Basic Metabolic Panel:  Recent Labs Lab 02/18/15 1250  NA 138  K 4.1  CL 106  CO2 23  GLUCOSE 110*  BUN 9  CREATININE 1.15*  CALCIUM 9.7    Lipid Panel:    Component Value Date/Time   CHOL 214* 02/19/2015 0516   TRIG 219* 02/19/2015 0516   HDL 33* 02/19/2015 0516   CHOLHDL 6.5 02/19/2015 0516   VLDL 44* 02/19/2015 0516   LDLCALC 137* 02/19/2015 0516   HgbA1c:  Lab Results  Component Value Date   HGBA1C 8.0* 07/03/2014   Urine Drug Screen:    Component Value Date/Time   LABOPIA NONE DETECTED 03/18/2014 0917   COCAINSCRNUR NONE DETECTED 03/18/2014 0917   LABBENZ NONE DETECTED 03/18/2014 0917   AMPHETMU NONE DETECTED 03/18/2014 0917   THCU NONE DETECTED 03/18/2014 0917   LABBARB NONE DETECTED 03/18/2014 0917      IMAGING  Dg Chest 2 View 02/18/2015    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 No acute cardiopulmonary disease.     Mr Jodene Nam Head/brain Wo Cm 02/18/2015    1. Acute right corona radiata infarct.  2. Mild-to-moderate chronic small vessel ischemic disease in the cerebral white matter.  3. Chronic bilateral basal ganglia lacunar infarcts.  4. No evidence of major intracranial arterial occlusion or flow-limiting proximal stenosis.  5. Mild bilateral ICA stenosis.  6. Moderate bilateral vertebral artery stenosis. Moderate right P2 PCA stenosis.    2D echo - - Left ventricle: The  cavity size was normal. Systolic function was mildly reduced. The estimated ejection fraction was in the range of 45% to 50%. There is mild hypokinesis of the anteroseptal myocardium. Doppler parameters are consistent with abnormal left ventricular relaxation (grade 1 diastolic dysfunction). - Left atrium: The atrium was mildly dilated.  CUS - Bilateral: 1-39% ICA stenosis. Vertebral artery flow is  antegrade.   PHYSICAL EXAM  Temp:  [97.8 F (36.6 C)-98.5 F (36.9 C)] 98.4 F (36.9 C) (09/23 1734) Pulse Rate:  [57-73] 73 (09/23 1734) Resp:  [16-20] 20 (09/23 1734) BP: (121-180)/(60-71) 121/71 mmHg (09/23 1734) SpO2:  [98 %-100 %] 100 % (09/23 1734)  General - Well nourished, well developed, in no apparent distress, flat affect.  Ophthalmologic - Fundi not visualized due to noncooperation.  Cardiovascular - Regular rate and rhythm with no murmur.  Mental Status -  Level of arousal and orientation to time, place, and person were intact. Language including expression, naming, repetition, comprehension was assessed and found intact. Fund of Knowledge was assessed and was intact.  Cranial Nerves II - XII - II - Visual field intact OU. III, IV, VI - Extraocular movements intact. V - Facial sensation intact bilaterally. VII - left facial droop. VIII - Hearing & vestibular intact bilaterally. X - Palate elevates symmetrically, mild dysarthria. XI - Chin turning & shoulder shrug intact bilaterally. XII - Tongue protrusion intact.  Motor Strength - The patient's strength was normal in all extremities and pronator drift was absent.  Bulk was normal and fasciculations were absent.   Motor Tone - Muscle tone was assessed at the neck and appendages and was normal.  Reflexes - The patient's reflexes were 1+ in all extremities and she had no pathological reflexes.  Sensory - Light touch, temperature/pinprick were assessed and were symmetrical.    Coordination - The patient had normal movements in the hands and feet with no ataxia or dysmetria.  Tremor was absent.  Gait and Station - deferred due to safety concerns.   ASSESSMENT/PLAN [name] is a 74 y.o. 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 She did not receive IV t-PA due to late presentation.  Stroke: left CR small infarct secondary to small vessel disease.  Resultant  dysarthria with left facial droop  MRI - acute right corona radiata infarct with chronic bilateral basal ganglia lacunar infarcts.  MRA - no major stenosis or occlusions.  Carotid Doppler- unremarkable  2D Echo - unremarkable  LDL 137  HgbA1c pending  VTE prophylaxis - subcutaneous heparin  Diet heart healthy/carb modified Room service appropriate?: Yes; Fluid consistency:: Thin  Aspirin 81 mg and Brilinta 90 mg prior to admission for CAD s/p stenting in 03/2014, now on aspirin 81 mg orally every day and Brilinta '90mg'$  bid. Continue dual antiplatlet as per Dr. Doylene Canard.  Patient counseled to be compliant with her antithrombotic medications  Ongoing aggressive stroke risk factor management  Therapy recommendations: Home health physical therapy recommended.  Disposition:  Pending  Hypertension  Blood pressure mildly high at times  Permissive hypertension (OK if < 220/120) but gradually normalize in 5-7 days  Hyperlipidemia  Home meds: No lipid lowering medications prior to admission.  LDL 137, goal < 70  Lipitor intolerance - add Pravachol  Continue statin at discharge  Diabetes  HgbA1c pending,  goal < 7.0  Controlled  Tobacco abuse  Current smoker  Smoking cessation counseling provided  Pt is willing to quit  Other Stroke Risk Factors  Advanced age  Hx stroke/TIA  Coronary artery disease s/p stent in 03/2014  Other Active Problems  Left hip pain - Tylenol ordered  Lipitor intolerance - Pravachol ordered  Hospital day # 1  Neurology will sign off. Please call with questions. Pt will follow up with Dr. Erlinda Hong at [facility] in about 2 months. Thanks for the consult.  Rosalin Hawking, MD PhD Stroke Neurology 02/20/2015 12:13 AM    To contact  Stroke Continuity provider, please refer to [email or web address] After hours, contact General Neurology

## 2015-02-19 NOTE — Discharge Summary (Signed)
Physician Discharge Summary  Patient ID: [name] MRN: [id number] DOB/AGE: Jun 17, 1940 74 y.o.  Admit date: 02/18/2015 Discharge date: 02/19/2015  Admission Diagnoses: TIA Old stroke Hypertension DM, II Hyperlipidemia PVD S/P AAA repair Tobacco use disorder Arthritis H/O DVT CAD S/P stent in RCA-03/2014  Discharge Diagnoses:  Principal Problem: * Acute corona radiata infarct * PAD (peripheral artery disease) Hypertension DM (diabetes mellitus) type II uncontrolled,  H/O Transient ischemic attack (TIA) H/O Stroke with left hemiparesis Cerebral infarction due to thrombosis of right middle cerebral artery Dysarthria  Hyerlipidemia S/P AAA repair Tobacco use disorder Arthritis H/O DVT CAD S/P stent in RCA-03/2014  Discharged Condition: fair  Hospital Course: 74 year old female with several TIAs- since 2002 and strokes in past has chronic speech and left sided weakness but last 3-4 days she feels additional weakness. She thinks she had another TIA. PMH significant for hypertension, DM, II, hypercholesterolemia, peripheral vascular disease, abdominal aneurysm repair-08/2005, Left Femoral-popliteal bypass surgery-07/06/2014, tobacco use disorder, history of DVT and arthritis. Denies new vision trouble. MRI/MRA brain showed acute corona radiata infarct. Neurology consult was obtained. Patient wants PT/OT at home. TEE was offered but patient declined. Patient tolerated aspirin, Brilinta and low dose Pravachol. Family admits to keeping 24 hour supervision on her. She will be followed by Dr. Terrence Dupont in 2 weeks.   Consults: cardiology and neurology  Significant Diagnostic Studies: labs: CBC normal CMET near normal with Creatinine of 1.15, glucose of 110, low ALT and AST. Elevated total cholesterol and Triglycerides. Hgb A1C pending.  EKG-Sinus bradycardia.  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 No acute  cardiopulmonary disease.  MRI/MRA brain: acute infarct involving the right corona radiata and no evidence of major intracranial arterial occlusion or flow-limiting proximal stenosis, mild bilateral ICA stenosis, moderate bilateral vertebral artery stenosis, and moderate right P2 PCA stenosis respectively.  Treatments: cardiac meds: metoprolol, amlodipine, aspirin, Brilinta, Imdur and Pravastatin.Marland Kitchen  Discharge Exam: Blood pressure 121/71, pulse 73, temperature 98.4 F (36.9 C), temperature source Oral, resp. rate 20, SpO2 100 %. Constitutional: She is well built and averagely nourished.  HENT: Head: Normocephalic and atraumatic. Brown eyes, Conjunctivae are normal. Right and Left eye exhibits no discharge. No scleral icterus.  Neck: Neck supple. No JVD present. No tracheal deviation present. No thyromegaly present.  Cardiovascular: Normal rate and regular rhythm.  Respiratory: Effort normal and breath sounds normal. No respiratory distress. She has no wheezes. She has no rales.  GI: Soft. Bowel sounds are normal. She exhibits no distension. There is no tenderness.  Musculoskeletal: She exhibits no edema or tenderness.  Neurological: She is alert and oriented to person, place, and time. Left sided weakness. Thick speech. Ambulation improved with walker use.   Disposition: 06-Home-Health Care Svc     Medication List    TAKE these medications        acetaminophen 500 MG tablet  Commonly known as:  TYLENOL  Take 1,000 mg by mouth every 6 (six) hours as needed for moderate pain.     amLODipine 5 MG tablet  Commonly known as:  NORVASC  Take 5 mg by mouth daily.     aspirin 81 MG tablet  Take 81 mg by mouth daily.     bisacodyl 5 MG EC tablet  Commonly known as:  DULCOLAX  Take 1 tablet (5 mg total) by mouth daily as needed for moderate constipation.     esomeprazole 40 MG capsule  Commonly known as:  NEXIUM  Take  40 mg by mouth daily as needed (acid reflux).      glimepiride 2 MG tablet  Commonly known as:  AMARYL  Take 2-3 mg by mouth 2 (two) times daily. Take 3 mg by mouth in the morning and take 2 mg by mouth in the afternoon     isosorbide mononitrate 60 MG 24 hr tablet  Commonly known as:  IMDUR  Take 1 tablet (60 mg total) by mouth daily.     metoprolol succinate 50 MG 24 hr tablet  Commonly known as:  TOPROL-XL  Take 50 mg by mouth daily.     nitroGLYCERIN 0.4 MG SL tablet  Commonly known as:  NITROSTAT  Place 0.4 mg under the tongue every 5 (five) minutes as needed for chest pain.     pravastatin 20 MG tablet  Commonly known as:  PRAVACHOL  Take 1 tablet (20 mg total) by mouth daily at 6 PM.     ticagrelor 90 MG Tabs tablet  Commonly known as:  BRILINTA  Take 1 tablet (90 mg total) by mouth 2 (two) times daily.     vitamin B-12 50 MCG tablet  Commonly known as:  CYANOCOBALAMIN  Take 50 mcg by mouth daily.           Follow-up Information    Follow up with [name], MD. Schedule an appointment as soon as possible for a visit in 2 weeks.   Specialty:  Cardiology   Contact information:   [facility] [address] [place] [address] [phone]       Signed: Birdie Riddle 02/19/2015, 7:22 PM

## 2015-02-19 NOTE — Progress Notes (Signed)
Discharge orders received.  Discharge instructions and follow-up appointments reviewed with the patient and her son.  VSS upon discharge.  IV removed and education complete.  Transported out via wheelchair. Cori Razor, RN

## 2015-02-19 NOTE — Progress Notes (Signed)
Occupational Therapy Evaluation Patient Details Name: [name] MRN: [id number] DOB: 12-03-1940 Today's Date: 02/19/2015    History of Present Illness Patient is a 74 y.o. 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  She was admitted due to increased left side weakness, slurred speech x 3 days.  Found to have acute right corona radiata infarct.   Clinical Impression   Patient presenting with decreased safety awareness, L inattention, decreased I in self care, and decreased balance. Patient Mod I PTA. Patient currently functioning at min A level overall. Patient will benefit from acute OT to increase overall independence in the areas of ADLs, functional mobility,and safety in order to safely discharge home. Pt with limited insight to deficits this session. Pt stating she lives alone but that her children can assist her at discharge if needed.     Follow Up Recommendations  Home health OT;Supervision/Assistance - 24 hour    Equipment Recommendations  None recommended by OT    Recommendations for Other Services  (none)     Precautions / Restrictions Precautions Precautions: Fall Precaution Comments: R gaze preference, L inattention Restrictions Weight Bearing Restrictions: No      Mobility Bed Mobility  General bed mobility comments: pt in chair upon entering the room.   Transfers Overall transfer level: Needs assistance Equipment used: Standard walker Transfers: Sit to/from Stand;Stand Pivot Transfers Sit to Stand: Supervision Stand pivot transfers: Supervision       General transfer comment: pushes up from chair with UE support    Balance Overall balance assessment: Needs assistance         Standing balance support: During functional  activity;Single extremity supported Standing balance-Leahy Scale: Poor Standing balance comment: use of RW for balance           ADL Overall ADL's : Needs assistance/impaired         General ADL Comments: Pt with limited awareness of deficits this session. Pt stating, "I can do all of these things for myself now. " Pt seated on edge of recliner chair to don B socks with steady assist. Pt ambulating to bathroom with RW and steady assist with min verbal cues for RW advancement to keep from hitting items on the L side. Sit <>stand from toilet with steady assist for balance during clothing management and hygiene.  Set up A to don clean hospital gown while in seated position.      Vision Vision Assessment?: Yes Eye Alignment: Within Functional Limits Ocular Range of Motion: Within Functional Limits Alignment/Gaze Preference: Gaze right Convergence: Within functional limits Additional Comments: Pt has inattention to the L but will look to L with min verbal cues.           Pertinent Vitals/Pain Pain Assessment: 0-10 Pain Score: 3  Pain Location: L hip Pain Descriptors / Indicators: Aching;Sore Pain Intervention(s): Monitored during session;Repositioned;Relaxation     Hand Dominance Right   Extremity/Trunk Assessment Upper Extremity Assessment Upper Extremity Assessment: LUE deficits/detail LUE Deficits / Details: AROM for shoulder elevation ~ 100 degrees, overall strength grossly 3-/5 throughout LUE Coordination: decreased fine motor;decreased gross motor   Lower Extremity Assessment Lower Extremity Assessment: Defer to PT evaluation LLE Deficits / Details: Overall strength grossly 4/5, but hip flexion NT due to pain lifts antigravity, denies changes in sensation   Cervical / Trunk  Assessment Cervical / Trunk Assessment: Kyphotic   Communication Communication Communication: Expressive difficulties   Cognition Arousal/Alertness: Awake/alert Behavior During Therapy: WFL  for tasks assessed/performed Overall Cognitive Status: Within Functional Limits for tasks assessed                       Home Living Family/patient expects to be discharged to:: Private residence Living Arrangements: Alone Available Help at Discharge: Family;Available PRN/intermittently Type of Home: Apartment Home Access: Level entry     Home Layout: One level     Bathroom Shower/Tub: Teacher, early years/pre: Handicapped height     Home Equipment: Walker - 4 wheels;Grab bars - tub/shower;Hand held shower head;Shower seat;Cane - single point          Prior Functioning/Environment Level of Independence: Independent with assistive device(s)        Comments: uses cane and rollator, gets groceries, sometimes son or daughter help when she asks them to    OT Diagnosis: Generalized weakness   OT Problem List: Decreased strength;Decreased range of motion;Impaired vision/perception;Decreased knowledge of use of DME or AE;Decreased coordination;Decreased activity tolerance;Impaired UE functional use;Pain;Decreased safety awareness;Impaired balance (sitting and/or standing)   OT Treatment/Interventions: Self-care/ADL training;Therapeutic exercise;Balance training;Neuromuscular education;Therapeutic activities;Energy conservation;DME and/or AE instruction;Patient/family education;Visual/perceptual remediation/compensation    OT Goals(Current goals can be found in the care plan section) Acute Rehab OT Goals Patient Stated Goal: To go home OT Goal Formulation: With patient Time For Goal Achievement: 03/05/15 Potential to Achieve Goals: Fair ADL Goals Pt Will Perform Upper Body Bathing: with modified independence Pt Will Perform Lower Body Bathing: with modified independence Pt Will Perform Upper Body Dressing: with modified independence;sitting Pt Will Perform Lower Body Dressing: with modified independence;sit to/from stand Pt Will Transfer to Toilet: with  modified independence;bedside commode;ambulating Pt Will Perform Toileting - Clothing Manipulation and hygiene: with modified independence;sit to/from stand Pt Will Perform Tub/Shower Transfer: grab bars;Tub transfer;3 in 1;ambulating;with supervision  OT Frequency: Min 2X/week   Barriers to D/C: Decreased caregiver support  Pt reports children will help her at home if she asks them to.           End of Session Equipment Utilized During Treatment: Rolling walker  Activity Tolerance: Patient tolerated treatment well Patient left: in chair;with call bell/phone within reach   Time: 3790-2409 OT Time Calculation (min): 25 min Charges:  OT General Charges $OT Visit: 1 Procedure OT Evaluation $Initial OT Evaluation Tier I: 1 Procedure OT Treatments $Self Care/Home Management : 8-22 mins  Phineas Semen, MS, OTR/L 02/19/2015, 2:27 PM

## 2015-02-19 NOTE — Progress Notes (Signed)
  Echocardiogram 2D Echocardiogram has been performed.  Jennette Dubin 02/19/2015, 12:18 PM

## 2015-02-19 NOTE — Evaluation (Signed)
Speech Language Pathology Evaluation Patient Details Name: [name] MRN: [id number] DOB: 1940-08-09 Today's Date: 02/19/2015 Time: 6295-2841 SLP Time Calculation (min) (ACUTE ONLY): 15 min  Problem List:  Patient Active Problem List   Diagnosis Date Noted  . Acute CVA (cerebrovascular accident) 02/19/2015  . Transient ischemic attack (TIA) 02/18/2015  . Stroke   . Cerebral infarction due to thrombosis of right middle cerebral artery   . Acute myocardial infarction of inferolateral wall 04/12/2014  . Numbness 03/18/2014  . TIA (transient ischemic attack) 03/18/2014  . DM (diabetes mellitus) type II uncontrolled, periph vascular disorder 03/18/2014  . Hyperlipidemia   . Hypertension   . PVD (peripheral vascular disease) 03/05/2014  . Transient cerebral ischemia 08/01/2012  . Atherosclerosis of native arteries of extremity with intermittent claudication 10/19/2011  . Unspecified disorders of arteries and arterioles 10/19/2011  . PAD (peripheral artery disease) 04/13/2011   Past Medical History:  Past Medical History  Diagnosis Date  . Peripheral vascular disease   . Diabetes mellitus   . Hyperlipidemia   . Hypertension   . TIA (transient ischemic attack)   . Arthritis     osteoarthritis  . Aneurysm   . Stroke   . AAA (abdominal aortic aneurysm)   . Myocardial infarction    Past Surgical History:  Past Surgical History  Procedure Laterality Date  . Joint replacement      left knee  . Uterine tumor    . Ventral hernia repair    . Abdominal aortic aneurysm repair    . Abdominal hysterectomy      partial  . Tubal ligation    . Back surgery    . Abdominal aortic aneurysm repair    . Abdominal aortagram N/A 03/13/2014    Procedure: ABDOMINAL AORTAGRAM;  Surgeon: Elam Dutch, MD;  Location: [facility] CATH LAB;  Service: Cardiovascular;  Laterality: N/A;  . Left heart catheterization with coronary angiogram N/A 04/12/2014    Procedure: LEFT HEART CATHETERIZATION WITH  CORONARY ANGIOGRAM;  Surgeon: Clent Demark, MD;  Location: [facility] CATH LAB;  Service: Cardiovascular;  Laterality: N/A;  . Percutaneous coronary stent intervention (pci-s)  04/12/2014    Procedure: PERCUTANEOUS CORONARY STENT INTERVENTION (PCI-S);  Surgeon: Clent Demark, MD;  Location: [facility] CATH LAB;  Service: Cardiovascular;;  prox and mid RCA  . Femoral-popliteal bypass graft Left 07/06/2014    Procedure: BYPASS GRAFT FEMORAL-POPLITEAL ARTERY;  Surgeon: Elam Dutch, MD;  Location: [facility] OR;  Service: Vascular;  Laterality: Left;   HPI:  74 year old female with several TIAs- since 2002 and strokes in past has chronic speech and left sided weakness but last 3-4 days she feels additional weakness. She thinks she had another TIA. PMH significant for hypertension, DM, II, hypercholesterolemia, peripheral vascular disease, abdominal aneurysm repair-08/2005, Left Femoral-popliteal bypass surgery-07/06/2014, tobacco use disorder, history of DVT and arthritis. Denies new vision trouble.   Assessment / Plan / Recommendation Clinical Impression  Patient presents with a very mild flaccid dysarthria secondary to left labial weakness, decreased ROM. Patient reports that she has some drooling after she coughs and when eating, but denies any decreased sensation and denies any food pocketing on left side. Patient's dysarthria affects the quality of her speech but does not significantly impact her speech intelligibility.    SLP Assessment       Follow Up Recommendations  None    Frequency and Duration        Pertinent Vitals/Pain Pain Assessment: No/denies pain Pain Score: 3  Pain Location: L hip Pain Descriptors / Indicators: Aching;Sore Pain Intervention(s): Monitored during session;Repositioned;Relaxation   SLP Goals  Patient/Family Stated Goal: waiting on the doctor "so I can go home"  SLP Evaluation Prior Functioning  Type of Home: Apartment Available Help at Discharge: Family;Available  PRN/intermittently   Cognition  Overall Cognitive Status: Within Functional Limits for tasks assessed Orientation Level: Oriented X4    Comprehension  Auditory Comprehension Overall Auditory Comprehension: Appears within functional limits for tasks assessed    Expression Expression Primary Mode of Expression: Verbal Verbal Expression Overall Verbal Expression: Appears within functional limits for tasks assessed Written Expression Dominant Hand: Right   Oral / Motor Oral Motor/Sensory Function Overall Oral Motor/Sensory Function: Impaired Labial ROM: Reduced left Labial Symmetry: Abnormal symmetry left Labial Strength: Reduced Lingual ROM: Within Functional Limits Lingual Symmetry: Within Functional Limits Lingual Strength: Within Functional Limits Facial ROM: Reduced left Facial Symmetry: Left droop Motor Speech Overall Motor Speech: Impaired Respiration: Within functional limits Phonation: Normal Resonance: Within functional limits Articulation: Impaired Level of Impairment: Phrase Intelligibility: Intelligibility reduced Word: 75-100% accurate Phrase: 75-100% accurate Sentence: 75-100% accurate Conversation: 75-100% accurate Motor Planning: Witnin functional limits Motor Speech Errors: Not applicable   GO     [name] 02/19/2015, 4:05 PM   Sonia Baller, MA, CCC-SLP 02/19/2015 4:06 PM

## 2015-02-19 NOTE — Care Management Note (Signed)
Case Management Note  Patient Details  Name: [name] MRN: [id number] Date of Birth: 1941/01/26  Subjective/Objective:                    Action/Plan: Patient admitted with CVA. Patient is from home alone. CM will continue to follow for discharge needs.  Expected Discharge Date:                  Expected Discharge Plan:  Home/Self Care  In-House Referral:     Discharge planning Services     Post Acute Care Choice:    Choice offered to:     DME Arranged:    DME Agency:     HH Arranged:    HH Agency:     Status of Service:  In process, will continue to follow  Medicare Important Message Given:    Date Medicare IM Given:    Medicare IM give by:    Date Additional Medicare IM Given:    Additional Medicare Important Message give by:     If discussed at [place] of Stay Meetings, dates discussed:    Additional Comments:  Pollie Friar, RN 02/19/2015, 2:08 PM

## 2015-02-20 DIAGNOSIS — E785 Hyperlipidemia, unspecified: Secondary | ICD-10-CM | POA: Insufficient documentation

## 2015-02-20 LAB — HEMOGLOBIN A1C
HEMOGLOBIN A1C: 8.4 % — AB (ref 4.8–5.6)
MEAN PLASMA GLUCOSE: 194 mg/dL

## 2015-02-22 NOTE — Care Management Note (Signed)
Case Management Note  Patient Details  Name: [name] MRN: [id number] Date of Birth: 12-08-1940  Subjective/Objective:                    Action/Plan: Patient discharged late on Friday evening. Patient's granddaughter called this am and spoke with CM about having Home Health services arranged. After reviewing patients chart and speaking with the Patient's granddaughter, she chose [facility] [facility]. Miranda with [facility] notified and accepted the referral.   Expected Discharge Date:                  Expected Discharge Plan:  [place]  In-House Referral:     Discharge planning Services  CM Consult  Post Acute Care Choice:    Choice offered to:     DME Arranged:    DME Agency:     HH Arranged:  RN, PT, Nurse's Aide [place] Agency:  [place]  Status of Service:  Completed, signed off  Medicare Important Message Given:    Date Medicare IM Given:    Medicare IM give by:    Date Additional Medicare IM Given:    Additional Medicare Important Message give by:     If discussed at [place] of Stay Meetings, dates discussed:    Additional Comments:  Pollie Friar, RN 02/22/2015, 10:13 AM

## 2015-03-30 HISTORY — PX: CORONARY ANGIOPLASTY: SHX604

## 2015-04-01 ENCOUNTER — Encounter (HOSPITAL_COMMUNITY): Payer: Medicare Other

## 2015-04-01 ENCOUNTER — Ambulatory Visit: Payer: Medicare Other | Admitting: Vascular Surgery

## 2015-04-02 ENCOUNTER — Ambulatory Visit (HOSPITAL_COMMUNITY)
Admission: RE | Admit: 2015-04-02 | Discharge: 2015-04-02 | Disposition: A | Discharge status: Home / Self Care | Payer: Medicare Other | Source: Ambulatory Visit | Attending: Vascular Surgery | Admitting: Vascular Surgery

## 2015-04-02 ENCOUNTER — Ambulatory Visit (INDEPENDENT_AMBULATORY_CARE_PROVIDER_SITE_OTHER)
Admission: RE | Admit: 2015-04-02 | Discharge: 2015-04-02 | Disposition: A | Discharge status: Home / Self Care | Payer: Medicare Other | Source: Ambulatory Visit | Attending: Vascular Surgery | Admitting: Vascular Surgery

## 2015-04-02 DIAGNOSIS — Z48812 Encounter for surgical aftercare following surgery on the circulatory system: Secondary | ICD-10-CM

## 2015-04-02 DIAGNOSIS — I739 Peripheral vascular disease, unspecified: Secondary | ICD-10-CM | POA: Insufficient documentation

## 2015-04-12 ENCOUNTER — Encounter: Payer: Self-pay | Admitting: Vascular Surgery

## 2015-04-13 ENCOUNTER — Encounter: Payer: Self-pay | Admitting: Vascular Surgery

## 2015-04-14 ENCOUNTER — Encounter: Payer: Self-pay | Admitting: Vascular Surgery

## 2015-04-14 ENCOUNTER — Ambulatory Visit (INDEPENDENT_AMBULATORY_CARE_PROVIDER_SITE_OTHER): Payer: Medicare Other | Admitting: Vascular Surgery

## 2015-04-14 VITALS — BP 154/78 | HR 75 | Temp 97.8°F | Resp 16 | Ht 67.5 in | Wt 174.0 lb

## 2015-04-14 DIAGNOSIS — I739 Peripheral vascular disease, unspecified: Secondary | ICD-10-CM

## 2015-04-14 NOTE — Progress Notes (Signed)
Filed Vitals:   04/14/15 0816 04/14/15 0818  BP: 167/93 154/78  Pulse: 73 75  Temp: 97.8 F (36.6 C)   Resp: 16   Height: 5' 7.5" (1.715 m)   Weight: 174 lb (78.926 kg)   SpO2: 100%

## 2015-04-14 NOTE — Progress Notes (Signed)
Patient is a 74 year old female who returns for follow-up today. She underwent a left femoral to below-knee popliteal bypass with reversed greater saphenous vein on 07/06/2014. This was done for a nonhealing ulcer on the left first toe. She also had claudication in the left leg. She states her claudication symptoms have completely resolved. However, she is having left hip pain. She is currently being evaluated by orthopedics for this. Her left first toe is completely healed.   She had one vessel runoff at the time of completion arteriogram.  She had a significant stroke approximately one month ago. 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 Her vision has improved. She still has weakness of her left arm and leg. Her carotid duplex at that time showed no significant stenosis. She is considering having left hip replacement. This is being followed by Dr. Rush Farmer.  Physical exam:    Filed Vitals:   04/14/15 0816 04/14/15 0818  BP: 167/93 154/78  Pulse: 73 75  Temp: 97.8 F (36.6 C)   Resp: 16   Height: 5' 7.5" (1.715 m)   Weight: 174 lb (78.926 kg)   SpO2: 100%     Left lower extremity:  trace edema left lower extremity no pedal pulses palpable left popliteal pulse is palpable.   Data: Patient had arterial duplex exam  approximately one week ago which showed no narrowing of her bypass graft. She does have some residual occlusive disease in the popliteal artery below the bypass. Her ABIs today on the left was 0.96 right was 0.6  Assessment: Doing well status post left femoral to below-knee popliteal bypass with a patent bypass graft and a heel toe with resolved claudication symptoms.  The patient has a history of minimal carotid disease. Her last carotid duplex exam was in 2016 and showed less than 40% stenosis bilaterally. She also had an exam several years prior to this. I do not believe she needs further carotid scans unless she develops any symptoms. Of  note she has also had repair of an abdominal aortic aneurysm by my partner Dr. Amedeo Plenty years ago. She has had no problems from this. She does have a history of an aberrant left subclavian artery with no significant changes in this in the last several years. However, she does not really want any intervention for this.  Plan: Follow-up November 2016 for graft duplex and ABIs. from my standpoint she can undergo left hip replacement with Dr. Rush Farmer if required. She should have postoperative ABIs after the procedure.   Ruta Hinds, MD [facility] Office: [phone] Pager: [phone]

## 2015-04-14 NOTE — Addendum Note (Signed)
Addended by: Dorthula Rue L on: 04/14/2015 11:48 AM   Modules accepted: Orders

## 2015-04-15 NOTE — Addendum Note (Signed)
Addended by: Dorthula Rue L on: 04/15/2015 01:07 PM   Modules accepted: Orders

## 2015-04-16 ENCOUNTER — Encounter: Payer: Self-pay | Admitting: Neurology

## 2015-04-16 ENCOUNTER — Ambulatory Visit (INDEPENDENT_AMBULATORY_CARE_PROVIDER_SITE_OTHER): Payer: Medicare Other | Admitting: Neurology

## 2015-04-16 VITALS — BP 134/65 | HR 68 | Ht 67.0 in | Wt 175.8 lb

## 2015-04-16 DIAGNOSIS — Z8673 Personal history of transient ischemic attack (TIA), and cerebral infarction without residual deficits: Secondary | ICD-10-CM | POA: Insufficient documentation

## 2015-04-16 DIAGNOSIS — I63311 Cerebral infarction due to thrombosis of right middle cerebral artery: Secondary | ICD-10-CM | POA: Diagnosis not present

## 2015-04-16 DIAGNOSIS — E1159 Type 2 diabetes mellitus with other circulatory complications: Secondary | ICD-10-CM | POA: Diagnosis not present

## 2015-04-16 DIAGNOSIS — E785 Hyperlipidemia, unspecified: Secondary | ICD-10-CM

## 2015-04-16 DIAGNOSIS — I25119 Atherosclerotic heart disease of native coronary artery with unspecified angina pectoris: Secondary | ICD-10-CM | POA: Insufficient documentation

## 2015-04-16 NOTE — Progress Notes (Signed)
STROKE NEUROLOGY FOLLOW UP NOTE  NAME: [name] DOB: 03-24-1941  REASON FOR VISIT: stroke follow up HISTORY FROM: pt and chart  Today we had the pleasure of seeing [name] in follow-up at our Neurology Clinic. Pt was accompanied by no one.   History Summary [name] is a 74 y.o. 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 MRI showed right CR small infarct secondary to small vessel disease. MRA showed no major vessel occlusion and CUS and TTE unremarkable. However, LDL 137 and A1C 8.4. She was already on ASA 81 and brilinta '90mg'$  bid for cardiac stent which was put in in 03/2014. She was also put on pravastatin due to lipitor intolerance. She was discharged in good condition with HH.   Interval History During the interval time, the patient has been doing better. However, she is complaining of severe left hip pain for which she has seen Dr. Ninfa Linden and plan for left hip surgery once get clearance from medical services. She also had retina procedure 2 weeks ago at right eye. Glucose controlled well at home, 110-120 on reading. Has not quit smoking yet. Has PT/OT/speech 1-2 times per week. She still following with cardiology Dr. Terrence Dupont for CAD and VVS with Dr. Oneida Alar for PAD. BP today 139/65.  REVIEW OF SYSTEMS: Full 14 system review of systems performed and notable only for those listed below and in HPI above, all others are negative:  Constitutional:   Cardiovascular:  Ear/Nose/Throat:   Skin: itching Eyes:   Loss of vision Respiratory:   Gastroitestinal:  constipation Genitourinary: urination problems Hematology/Lymphatic:  Easy bruise Endocrine:  Musculoskeletal:  Joint pain, joint swelling,  aching muscles Allergy/Immunology:  Running nose, skin sensitivity Neurological:  Numbness, weakness, slurry speech, difficulty swallowing Psychiatric: too much sleep, decreased energy, change in appetite Sleep: sleepiness, restless legs  The following represents the patient's updated allergies and side effects list: Allergies  Allergen Reactions  . Codeine Other (See Comments)    Knocked her out of equilibrium. Made her world flip upside down  . Lipitor [Atorvastatin] Other (See Comments)    Severe pain/cramping in legs  . Azor [Amlodipine-Olmesartan] Swelling, Palpitations and Rash    Amlodipine alone resumed 12/2013, tolerating  . Lisinopril Cough  . Penicillins Other (See Comments)    Makes exzcema worse    The neurologically relevant items on the patient's problem list were reviewed on today's visit.  Neurologic Examination  A problem focused neurological exam (12 or more points of the single system neurologic examination, vital signs counts as 1 point, cranial nerves count for 8 points) was performed.  Blood pressure 134/65, pulse 68, height '5\' 7"'$  (1.702 m), weight 175 lb 12.8 oz (79.742 kg).  General - Well nourished, well developed, in no apparent distress.  Ophthalmologic - Fundi not visualized due to eye movement.  Cardiovascular - Regular rate and rhythm.  Mental Status -  Level of arousal and orientation to time, place, and person were intact. Language including expression, naming, repetition, comprehension was assessed and found intact. Fund of Knowledge was assessed and was intact.  Cranial Nerves II - XII - II - Visual field intact OU. III, IV, VI - Extraocular movements intact. V - Facial sensation intact bilaterally. VII - Facial movement showed left facial  droop. VIII - Hearing & vestibular intact bilaterally. X - Palate elevates symmetrically. XI - Chin turning & shoulder shrug intact bilaterally. XII - Tongue protrusion intact.  Motor Strength - The  patient's strength was normal in all extremities except left LE proximal 3/5 due to left hip pain and pronator drift was absent.  Bulk was normal and fasciculations were absent.   Motor Tone - Muscle tone was assessed at the neck and appendages and was normal.  Reflexes - The patient's reflexes were 1+ in all extremities and she had no pathological reflexes.  Sensory - Light touch, temperature/pinprick, vibration and proprioception, and Romberg testing were assessed and were normal.    Coordination - The patient had normal movements in the hands and feet with no ataxia or dysmetria.  Tremor was absent.  Gait and Station - walk with rolling walker, steady, mild left antelgic gait.   Data reviewed: I personally reviewed the images and agree with the radiology interpretations.  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 No acute cardiopulmonary disease.   Mr Jodene Nam Head/brain Wo Cm 02/18/2015  1. Acute right corona radiata infarct.  2. Mild-to-moderate chronic small vessel ischemic disease in the cerebral white matter.  3. Chronic bilateral basal ganglia lacunar infarcts.  4. No evidence of major intracranial arterial occlusion or flow-limiting proximal stenosis.  5. Mild bilateral ICA stenosis.  6. Moderate bilateral vertebral artery stenosis. Moderate right P2 PCA stenosis.   2D echo - - Left ventricle: The cavity size was normal. Systolic function was mildly reduced. The estimated ejection fraction was in the range of 45% to 50%. There is mild hypokinesis of the anteroseptal myocardium. Doppler parameters are consistent with abnormal left ventricular relaxation (grade 1 diastolic dysfunction). - Left atrium: The atrium was mildly dilated.  CUS - Bilateral: 1-39% ICA stenosis. Vertebral artery flow is antegrade.  Component     Latest Ref Rng 02/19/2015  Cholesterol     0 - 200 mg/dL 214 (H)   Triglycerides     <150 mg/dL 219 (H)  HDL Cholesterol     >40 mg/dL 33 (L)  Total CHOL/HDL Ratio      6.5  VLDL     0 - 40 mg/dL 44 (H)  LDL (calc)     0 - 99 mg/dL 137 (H)  Hemoglobin A1C     4.8 - 5.6 % 8.4 (H)  Mean Plasma Glucose      194    Assessment: As you may recall, she is a 74 y.o. 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 MRA showed no major vessel occlusion and CUS and TTE unremarkable. However, LDL 137 and A1C 8.4. She was already on ASA 81 and brilinta '90mg'$  bid for cardiac stent which was put in in 03/2014. She was also put on pravastatin due to lipitor intolerance. She was discharged in good condition with HH. During the interval time, the patient has severe left hip pain for which she has seen Dr. Ninfa Linden and plan for left hip surgery. Has not quit smoking yet. Has PT/OT/speech 1-2 times per week.   Plan:  - OK to have left hip surgery from stroke standpoint given severe left hip  pain. Recommend to avoid peri-operative hypotension - continue ASA and brilinta for stroke and cardiac prevention - continue pravastatin for stroke prevention - check BP and glucose at home - Follow up with your primary care physician for stroke risk factor modification. Recommend maintain blood pressure goal <130/80, diabetes with hemoglobin A1c goal below 6.5% and lipids with LDL cholesterol goal below 70 mg/dL.  - follow up with Dr. Terrence Dupont, Dr. Oneida Alar, Dr. Ninfa Linden for cardiology, vascular surgery and orthopedics - continue PT/OT/speech  - follow up in 3 months.  I spent more than 25 minutes of face to face time with the patient. Greater than 50% of time was spent in counseling and coordination of care.  We have discussed about stroke prevention and aggressive rehab, quit smoking and compliant with medication.   No orders of the defined types were placed in this encounter.    Meds ordered this encounter  Medications  . prednisoLONE acetate (PRED FORTE) 1 % ophthalmic suspension    Sig: INSTILL 1 DROP INTO THE RIGHT EYE QID    Refill:  0  . tobramycin (TOBREX) 0.3 % ophthalmic solution    Sig: INSTILL 1 DROP INTO THE RIGHT EYE QID    Refill:  0  . Vitamin D, Ergocalciferol, (DRISDOL) 50000 UNITS CAPS capsule    Sig: TK ONE C PO  ONCE A WEEK    Refill:  0    Patient Instructions  - continue ASA and brilinta for stroke and cardiac prevention - continue pravastatin for stroke prevention - check BP and glucose at home - Follow up with your primary care physician for stroke risk factor modification. Recommend maintain blood pressure goal <130/80, diabetes with hemoglobin A1c goal below 6.5% and lipids with LDL cholesterol goal below 70 mg/dL.  - follow up with Dr. Terrence Dupont, Dr. Oneida Alar, Dr. Ninfa Linden for cardiology, vascular surgery and orthopedics - continue PT/OT/speech  - follow up in 3 months.   Rosalin Hawking, MD PhD [facility] [address] [place], [place] [address] [phone]

## 2015-04-16 NOTE — Patient Instructions (Signed)
-   continue ASA and brilinta for stroke and cardiac prevention - continue pravastatin for stroke prevention - check BP and glucose at home - Follow up with your primary care physician for stroke risk factor modification. Recommend maintain blood pressure goal <130/80, diabetes with hemoglobin A1c goal below 6.5% and lipids with LDL cholesterol goal below 70 mg/dL.  - follow up with Dr. Terrence Dupont, Dr. Oneida Alar, Dr. Ninfa Linden for cardiology, vascular surgery and orthopedics - continue PT/OT/speech  - follow up in 3 months.

## 2015-04-16 NOTE — Progress Notes (Unsigned)
Letter given to patient for clearance for hip surgery at appt.

## 2015-05-05 ENCOUNTER — Other Ambulatory Visit (HOSPITAL_COMMUNITY): Payer: Self-pay | Admitting: Orthopaedic Surgery

## 2015-05-07 ENCOUNTER — Encounter (HOSPITAL_COMMUNITY): Payer: Self-pay

## 2015-05-07 ENCOUNTER — Encounter (HOSPITAL_COMMUNITY)
Admission: RE | Admit: 2015-05-07 | Discharge: 2015-05-07 | Disposition: A | Discharge status: Home / Self Care | Payer: Medicare Other | Source: Ambulatory Visit | Attending: Orthopaedic Surgery | Admitting: Orthopaedic Surgery

## 2015-05-07 ENCOUNTER — Other Ambulatory Visit (HOSPITAL_COMMUNITY): Payer: Self-pay | Admitting: *Deleted

## 2015-05-07 DIAGNOSIS — Z7984 Long term (current) use of oral hypoglycemic drugs: Secondary | ICD-10-CM | POA: Insufficient documentation

## 2015-05-07 DIAGNOSIS — E785 Hyperlipidemia, unspecified: Secondary | ICD-10-CM | POA: Insufficient documentation

## 2015-05-07 DIAGNOSIS — I252 Old myocardial infarction: Secondary | ICD-10-CM | POA: Diagnosis not present

## 2015-05-07 DIAGNOSIS — Z01812 Encounter for preprocedural laboratory examination: Secondary | ICD-10-CM | POA: Diagnosis not present

## 2015-05-07 DIAGNOSIS — E119 Type 2 diabetes mellitus without complications: Secondary | ICD-10-CM | POA: Diagnosis not present

## 2015-05-07 DIAGNOSIS — Z79899 Other long term (current) drug therapy: Secondary | ICD-10-CM | POA: Diagnosis not present

## 2015-05-07 DIAGNOSIS — Z7982 Long term (current) use of aspirin: Secondary | ICD-10-CM | POA: Diagnosis not present

## 2015-05-07 DIAGNOSIS — I739 Peripheral vascular disease, unspecified: Secondary | ICD-10-CM | POA: Diagnosis not present

## 2015-05-07 DIAGNOSIS — I10 Essential (primary) hypertension: Secondary | ICD-10-CM | POA: Diagnosis not present

## 2015-05-07 DIAGNOSIS — Z955 Presence of coronary angioplasty implant and graft: Secondary | ICD-10-CM | POA: Diagnosis not present

## 2015-05-07 DIAGNOSIS — Z8673 Personal history of transient ischemic attack (TIA), and cerebral infarction without residual deficits: Secondary | ICD-10-CM | POA: Insufficient documentation

## 2015-05-07 DIAGNOSIS — I2510 Atherosclerotic heart disease of native coronary artery without angina pectoris: Secondary | ICD-10-CM | POA: Insufficient documentation

## 2015-05-07 DIAGNOSIS — Z01818 Encounter for other preprocedural examination: Secondary | ICD-10-CM | POA: Insufficient documentation

## 2015-05-07 DIAGNOSIS — Z95828 Presence of other vascular implants and grafts: Secondary | ICD-10-CM | POA: Diagnosis not present

## 2015-05-07 DIAGNOSIS — F17200 Nicotine dependence, unspecified, uncomplicated: Secondary | ICD-10-CM | POA: Diagnosis not present

## 2015-05-07 DIAGNOSIS — M1612 Unilateral primary osteoarthritis, left hip: Secondary | ICD-10-CM | POA: Diagnosis not present

## 2015-05-07 HISTORY — DX: Gastro-esophageal reflux disease without esophagitis: K21.9

## 2015-05-07 HISTORY — DX: Calculus of kidney: N20.0

## 2015-05-07 HISTORY — DX: Pneumonia, unspecified organism: J18.9

## 2015-05-07 HISTORY — DX: Unspecified urinary incontinence: R32

## 2015-05-07 HISTORY — DX: Unspecified glaucoma: H40.9

## 2015-05-07 HISTORY — DX: Constipation, unspecified: K59.00

## 2015-05-07 LAB — CBC
HEMATOCRIT: 37.1 % (ref 36.0–46.0)
HEMOGLOBIN: 12 g/dL (ref 12.0–15.0)
MCH: 29.1 pg (ref 26.0–34.0)
MCHC: 32.3 g/dL (ref 30.0–36.0)
MCV: 89.8 fL (ref 78.0–100.0)
Platelets: 183 10*3/uL (ref 150–400)
RBC: 4.13 MIL/uL (ref 3.87–5.11)
RDW: 14.6 % (ref 11.5–15.5)
WBC: 6.8 10*3/uL (ref 4.0–10.5)

## 2015-05-07 LAB — BASIC METABOLIC PANEL
ANION GAP: 9 (ref 5–15)
BUN: 14 mg/dL (ref 6–20)
CALCIUM: 9.6 mg/dL (ref 8.9–10.3)
CO2: 21 mmol/L — AB (ref 22–32)
Chloride: 108 mmol/L (ref 101–111)
Creatinine, Ser: 1.17 mg/dL — ABNORMAL HIGH (ref 0.44–1.00)
GFR calc Af Amer: 51 mL/min — ABNORMAL LOW (ref >60)
GFR calc non Af Amer: 44 mL/min — ABNORMAL LOW (ref >60)
GLUCOSE: 106 mg/dL — AB (ref 65–99)
POTASSIUM: 4.4 mmol/L (ref 3.5–5.1)
Sodium: 138 mmol/L (ref 135–145)

## 2015-05-07 LAB — SURGICAL PCR SCREEN
MRSA, PCR: NEGATIVE
STAPHYLOCOCCUS AUREUS: NEGATIVE

## 2015-05-07 LAB — GLUCOSE, CAPILLARY: Glucose-Capillary: 113 mg/dL — ABNORMAL HIGH (ref 65–99)

## 2015-05-07 NOTE — Pre-Procedure Instructions (Addendum)
[name]  05/07/2015     Your procedure is scheduled on Tuesday, May 18, 2015 at 12:20 PM.   Report to [facility] Entrance "A" Admitting Office at 10:15 AM.   Call this number if you have problems the morning of surgery: [phone]   Any questions prior to day of surgery, please call [phone] between 8 & 4 PM.   Remember:  Do not eat food or drink liquids after midnight Monday, 05/17/15.  Take these medicines the morning of surgery with A SIP OF WATER: Amlodipine (Norvasc), Isosorbide Mononitrate (Imdur), Metoprolol (Toprol XL).  Follow physician's instructions about Aspirin and Brilinta use prior to surgery.  How to Manage Your Diabetes Before Surgery   Why is it important to control my blood sugar before and after surgery?   Improving blood sugar levels before and after surgery helps healing and can limit problems.  A way of improving blood sugar control is eating a healthy diet by:  - Eating less sugar and carbohydrates  - Increasing activity/exercise  - Talk with your doctor about reaching your blood sugar goals  High blood sugars (greater than 180 mg/dL) can raise your risk of infections and slow down your recovery so you will need to focus on controlling your diabetes during the weeks before surgery.  Make sure that the doctor who takes care of your diabetes knows about your planned surgery including the date and location.  How do I manage my blood sugars before surgery?   Check your blood sugar at least 4 times a day, 2 days before surgery to make sure that they are not too high or low.   Check your blood sugar the morning of your surgery when you wake up and every 2 hours until you get to the Short-Stay unit.   Treat a low blood sugar (less than 70 mg/dL) with 1/2 cup of clear juice (cranberry or apple), 4 glucose tablets, OR glucose gel.   Recheck blood sugar in 15 minutes after treatment (to make sure it is greater than 70 mg/dL).   If blood sugar is not greater than 70 mg/dL on re-check, call [phone] for further instructions.    Report your blood sugar to the Short-Stay nurse when you get to Short-Stay.  References:  [facility], 2007 "How to Manage your Diabetes Before and After Surgery".  What do I do about my diabetes medications?   Do not take oral diabetes medicines (pills) the morning of surgery.   Do not wear jewelry, make-up or nail polish.  Do not wear lotions, powders, or perfumes.  You may wear deodorant.  Do not shave 48 hours prior to surgery.    Do not bring valuables to the hospital.  [facility] is not responsible for any belongings or valuables.  Contacts, dentures or bridgework may not be worn into surgery.  Leave your suitcase in the car.  After surgery it may be brought to your room.  For patients admitted to the hospital, discharge time will be determined by your treatment team.  Special instructions:  [place] - Preparing for Surgery  Before surgery, you can play an important role.  Because skin is not sterile, your skin needs to be as free of germs as possible.  You can reduce the number of germs on you skin by washing with CHG (chlorahexidine gluconate) soap before surgery.  CHG is an antiseptic cleaner which kills germs and bonds with the skin to continue killing germs  even after washing.  Please DO NOT use if you have an allergy to CHG or antibacterial soaps.  If your skin becomes reddened/irritated stop using the CHG and inform your nurse when you arrive at Short Stay.  Do not shave (including legs and underarms) for at least 48 hours prior to the first CHG shower.  You may shave your face.  Please follow these instructions carefully:   1.  Shower with CHG Soap the night before surgery and the                                morning of Surgery.  2.  If you choose to wash your hair, wash your hair first as usual with your       normal shampoo.  3.   After you shampoo, rinse your hair and body thoroughly to remove the                      Shampoo.  4.  Use CHG as you would any other liquid soap.  You can apply chg directly       to the skin and wash gently with scrungie or a clean washcloth.  5.  Apply the CHG Soap to your body ONLY FROM THE NECK DOWN.        Do not use on open wounds or open sores.  Avoid contact with your eyes, ears, mouth and genitals (private parts).  Wash genitals (private parts) with your normal soap.  6.  Wash thoroughly, paying special attention to the area where your surgery        will be performed.  7.  Thoroughly rinse your body with warm water from the neck down.  8.  DO NOT shower/wash with your normal soap after using and rinsing off       the CHG Soap.  9.  Pat yourself dry with a clean towel.            10.  Wear clean pajamas.            11.  Place clean sheets on your bed the night of your first shower and do not        sleep with pets.  Day of Surgery  Do not apply any lotions the morning of surgery.  Please wear clean clothes to the hospital.   Please read over the following fact sheets that you were given. Pain Booklet, Coughing and Deep Breathing, MRSA Information and Surgical Site Infection Prevention

## 2015-05-07 NOTE — Progress Notes (Signed)
Pt has hx of MI with stents from November 2015, hx of repair AAA, hx of several strokes, most recent in September 2016. Pt denies any recent chest pain or sob, no new stroke symptoms. She still has some left sided weakness and very slight slurred speech. Daughter states she has come a long way from the time she had the stroke.  Neurology clearance in EP[facility] (04/16/15) Vascular clearance in EPIC (04/14/15) Pt states that Dr. Ninfa Linden has cardiac clearance from Dr. Terrence Dupont. Have requested that be sent to [place]. Pt also states that Dr. Terrence Dupont told her to stop Brilinta 5 days prior to surgery. She states he did not mention stopping Aspirin. I have faxed his office to have him notify pt (and [place]) whether pt is to stop Aspirin at the same time she stops Brilinta. Pt is aware that office will be calling her.   Pt is diabetic, on Glimiperide. She states her fasting blood sugars run around 113, but recently she's noticed that her blood sugar "drops". She thinks she might not be eating enough. Last A1C was 8.4 on 02/19/15. Will get A1C today. CBG today was 113.

## 2015-05-08 LAB — HEMOGLOBIN A1C
HEMOGLOBIN A1C: 7.6 % — AB (ref 4.8–5.6)
Mean Plasma Glucose: 171 mg/dL

## 2015-05-10 NOTE — Progress Notes (Signed)
Anesthesia Chart Review:  Pt is 74 year old female scheduled for L total hip arthroplasty anterior approach on 05/18/2015 with Dr. Kathrynn Speed.   Cardiologist is Dr. Terrence Dupont. Peripheral vascular surgeon is Dr. Oneida Alar. Neurologist is Dr. Erlinda Hong.   PMH includes:  CAD (STEMI, DES to RCA x2 03/2014), stroke (01/2015), HTN, DM, hyperlipidemia, PVD, AAA (s/p repair with tube graft 08/2005), subclavian aneurysm, TIA, glaucoma. Current smoker. BMI 27. S/p L fem-pop bypass graft 07/06/14.   Medications include: amlodipine, ASA, nexium, glimepiride, imdur, metoprolol, pravastatin, brilinta. Pt to stop brilinta 5 days prior to surgery per Dr. Terrence Dupont.   Preoperative labs reviewed.  Glucose 106, hgbA1c 7.6.   Chest x-ray 02/18/15 reviewed. 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 No acute cardiopulmonary disease.  EKG 02/18/15: Sinus bradycardia (52 bpm). Possible Left atrial enlargement  Echo 02/19/15:  - Left ventricle: The cavity size was normal. Systolic function was mildly reduced. The estimated ejection fraction was in the range of 45% to 50%. There is mild hypokinesis of the anteroseptal myocardium. Doppler parameters are consistent with abnormal left ventricular relaxation (grade 1 diastolic dysfunction). - Left atrium: The atrium was mildly dilated.  02/18/15: Findings suggest 1-39% internal carotid artery stenosis bilaterally.  Nuclear stress test 02/05/15:  1. No reversible ischemia or infarction. 2. Normal left ventricular wall motion. 3. Left ventricular ejection fraction 52% 4. Low-risk stress test findings  Cardiac cath 04/12/14:  - Findings showed good LV systolic function which was done at the end of the procedure. EF of 55% to 60%.  - Left main was patent.  - LAD has 60% to 70% ostial stenosis and 30% to 40% proximal and mid and distal stenosis. Diagonal 1 was patent.  - Left circumflex has 40% to 50% long, smooth mid stenosis. OM-1 was  very, very small. OM-2 has 30% to 40% proximal and mid stenosis distally. Left circumflex tapers down in AV groove. OM-3 has 40% to 50% mid stenosis. All these vessels were very tortuous.  - RCA was 100% occluded beyond proximal portion and shepherd crook takeoff. - PTCA to proximal and mid RCA. DES to proximal and mid RCA - PTCA to mid and distal junction of RCA. DES to mid and distal junction of RCA   CT angio chest 03/11/14:  1. 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 2. 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 3. Emphysema. 4. Coronary artery disease.  Pt has neurology clearance for surgery from Dr. Erlinda Hong in [place] note dated 04/16/15. Pt has vascular surgery clearance for surgery from Dr. Oneida Alar in [place] note dated 04/14/15. Pt has cardiac clearance for surgery from Dr. Terrence Dupont (on paper chart).    If no changes, I anticipate pt can proceed with surgery as scheduled.   Laura Cass, FNP-BC [facility]/Anesthesiology Phone: [phone] 05/10/2015 4:27 PM

## 2015-05-17 ENCOUNTER — Other Ambulatory Visit (HOSPITAL_COMMUNITY): Payer: Medicare Other

## 2015-05-17 MED ORDER — CLINDAMYCIN PHOSPHATE 900 MG/50ML IV SOLN
900.0000 mg | INTRAVENOUS | Status: AC
  Administered 2015-05-18: 900 mg via INTRAVENOUS
  Filled 2015-05-17: qty 50

## 2015-05-18 ENCOUNTER — Inpatient Hospital Stay (HOSPITAL_COMMUNITY)
Admission: RE | Admit: 2015-05-18 | Discharge: 2015-05-21 | DRG: 470 | Disposition: A | Discharge status: Home Health | Payer: Medicare Other | Source: Ambulatory Visit | Attending: Orthopaedic Surgery | Admitting: Orthopaedic Surgery

## 2015-05-18 ENCOUNTER — Encounter (HOSPITAL_COMMUNITY)
Admission: RE | Disposition: A | Discharge status: Home Health | Payer: Self-pay | Source: Ambulatory Visit | Attending: Orthopaedic Surgery

## 2015-05-18 ENCOUNTER — Inpatient Hospital Stay (HOSPITAL_COMMUNITY): Payer: Medicare Other

## 2015-05-18 ENCOUNTER — Inpatient Hospital Stay (HOSPITAL_COMMUNITY): Payer: Medicare Other | Admitting: Emergency Medicine

## 2015-05-18 ENCOUNTER — Inpatient Hospital Stay (HOSPITAL_COMMUNITY): Payer: Medicare Other | Admitting: Anesthesiology

## 2015-05-18 ENCOUNTER — Encounter (HOSPITAL_COMMUNITY): Payer: Self-pay | Admitting: *Deleted

## 2015-05-18 DIAGNOSIS — I2510 Atherosclerotic heart disease of native coronary artery without angina pectoris: Secondary | ICD-10-CM | POA: Diagnosis present

## 2015-05-18 DIAGNOSIS — F17210 Nicotine dependence, cigarettes, uncomplicated: Secondary | ICD-10-CM | POA: Diagnosis present

## 2015-05-18 DIAGNOSIS — Z8679 Personal history of other diseases of the circulatory system: Secondary | ICD-10-CM | POA: Diagnosis not present

## 2015-05-18 DIAGNOSIS — K219 Gastro-esophageal reflux disease without esophagitis: Secondary | ICD-10-CM | POA: Diagnosis present

## 2015-05-18 DIAGNOSIS — M1612 Unilateral primary osteoarthritis, left hip: Secondary | ICD-10-CM | POA: Diagnosis present

## 2015-05-18 DIAGNOSIS — D62 Acute posthemorrhagic anemia: Secondary | ICD-10-CM | POA: Diagnosis not present

## 2015-05-18 DIAGNOSIS — Z880 Allergy status to penicillin: Secondary | ICD-10-CM | POA: Diagnosis not present

## 2015-05-18 DIAGNOSIS — Z885 Allergy status to narcotic agent status: Secondary | ICD-10-CM

## 2015-05-18 DIAGNOSIS — Z8673 Personal history of transient ischemic attack (TIA), and cerebral infarction without residual deficits: Secondary | ICD-10-CM | POA: Diagnosis not present

## 2015-05-18 DIAGNOSIS — I739 Peripheral vascular disease, unspecified: Secondary | ICD-10-CM | POA: Diagnosis present

## 2015-05-18 DIAGNOSIS — I252 Old myocardial infarction: Secondary | ICD-10-CM

## 2015-05-18 DIAGNOSIS — E1151 Type 2 diabetes mellitus with diabetic peripheral angiopathy without gangrene: Secondary | ICD-10-CM | POA: Diagnosis present

## 2015-05-18 DIAGNOSIS — I10 Essential (primary) hypertension: Secondary | ICD-10-CM | POA: Diagnosis present

## 2015-05-18 DIAGNOSIS — Z96642 Presence of left artificial hip joint: Secondary | ICD-10-CM

## 2015-05-18 DIAGNOSIS — Z888 Allergy status to other drugs, medicaments and biological substances status: Secondary | ICD-10-CM | POA: Diagnosis not present

## 2015-05-18 DIAGNOSIS — I714 Abdominal aortic aneurysm, without rupture: Secondary | ICD-10-CM | POA: Diagnosis present

## 2015-05-18 DIAGNOSIS — H409 Unspecified glaucoma: Secondary | ICD-10-CM | POA: Diagnosis present

## 2015-05-18 DIAGNOSIS — Z419 Encounter for procedure for purposes other than remedying health state, unspecified: Secondary | ICD-10-CM

## 2015-05-18 DIAGNOSIS — E785 Hyperlipidemia, unspecified: Secondary | ICD-10-CM | POA: Diagnosis present

## 2015-05-18 HISTORY — PX: TOTAL HIP ARTHROPLASTY: SHX124

## 2015-05-18 LAB — GLUCOSE, CAPILLARY
GLUCOSE-CAPILLARY: 94 mg/dL (ref 65–99)
GLUCOSE-CAPILLARY: 179 mg/dL — AB (ref 65–99)
GLUCOSE-CAPILLARY: 309 mg/dL — AB (ref 65–99)
Glucose-Capillary: 79 mg/dL (ref 65–99)
Glucose-Capillary: 89 mg/dL (ref 65–99)
Glucose-Capillary: 99 mg/dL (ref 65–99)

## 2015-05-18 SURGERY — ARTHROPLASTY, HIP, TOTAL, ANTERIOR APPROACH
Anesthesia: General | Site: Hip | Laterality: Left

## 2015-05-18 MED ORDER — HYDROMORPHONE HCL 1 MG/ML IJ SOLN
0.5000 mg | INTRAMUSCULAR | Status: DC | PRN
  Administered 2015-05-19 – 2015-05-20 (×3): 0.5 mg via INTRAVENOUS
  Filled 2015-05-18 (×3): qty 1

## 2015-05-18 MED ORDER — LACTATED RINGERS IV SOLN
INTRAVENOUS | Status: DC
  Administered 2015-05-18 (×3): via INTRAVENOUS

## 2015-05-18 MED ORDER — PROPOFOL 10 MG/ML IV BOLUS
INTRAVENOUS | Status: DC | PRN
  Administered 2015-05-18: 120 mg via INTRAVENOUS

## 2015-05-18 MED ORDER — ONDANSETRON HCL 4 MG/2ML IJ SOLN
INTRAMUSCULAR | Status: DC | PRN
  Administered 2015-05-18: 4 mg via INTRAVENOUS

## 2015-05-18 MED ORDER — FENTANYL CITRATE (PF) 100 MCG/2ML IJ SOLN
INTRAMUSCULAR | Status: AC
  Administered 2015-05-18: 50 ug via INTRAVENOUS
  Filled 2015-05-18: qty 2

## 2015-05-18 MED ORDER — MENTHOL 3 MG MT LOZG: 1.0000 | LOZENGE | OROMUCOSAL | Status: DC | PRN

## 2015-05-18 MED ORDER — TICAGRELOR 90 MG PO TABS
90.0000 mg | ORAL_TABLET | Freq: Two times a day (BID) | ORAL | Status: DC
  Administered 2015-05-18 – 2015-05-21 (×5): 90 mg via ORAL
  Filled 2015-05-18 (×7): qty 1

## 2015-05-18 MED ORDER — ONDANSETRON HCL 4 MG/2ML IJ SOLN
INTRAMUSCULAR | Status: AC
  Filled 2015-05-18: qty 2

## 2015-05-18 MED ORDER — METOCLOPRAMIDE HCL 5 MG/ML IJ SOLN: 5.0000 mg | Freq: Three times a day (TID) | INTRAMUSCULAR | Status: DC | PRN

## 2015-05-18 MED ORDER — FENTANYL CITRATE (PF) 100 MCG/2ML IJ SOLN
25.0000 ug | INTRAMUSCULAR | Status: DC | PRN
  Administered 2015-05-18 (×2): 50 ug via INTRAVENOUS

## 2015-05-18 MED ORDER — PRAVASTATIN SODIUM 20 MG PO TABS
20.0000 mg | ORAL_TABLET | Freq: Every day | ORAL | Status: DC
  Administered 2015-05-18 – 2015-05-21 (×4): 20 mg via ORAL
  Filled 2015-05-18 (×4): qty 1

## 2015-05-18 MED ORDER — METOCLOPRAMIDE HCL 5 MG PO TABS: 5.0000 mg | ORAL_TABLET | Freq: Three times a day (TID) | ORAL | Status: DC | PRN

## 2015-05-18 MED ORDER — TRAMADOL HCL 50 MG PO TABS
100.0000 mg | ORAL_TABLET | Freq: Four times a day (QID) | ORAL | Status: DC | PRN
  Administered 2015-05-18 – 2015-05-21 (×2): 100 mg via ORAL
  Filled 2015-05-18 (×3): qty 2

## 2015-05-18 MED ORDER — DEXTROSE 50 % IV SOLN
INTRAVENOUS | Status: AC
  Filled 2015-05-18: qty 50

## 2015-05-18 MED ORDER — SODIUM CHLORIDE 0.9 % IV SOLN
INTRAVENOUS | Status: DC
  Administered 2015-05-19: 07:00:00 via INTRAVENOUS

## 2015-05-18 MED ORDER — ALUM & MAG HYDROXIDE-SIMETH 200-200-20 MG/5ML PO SUSP: 30.0000 mL | ORAL | Status: DC | PRN

## 2015-05-18 MED ORDER — ASPIRIN EC 325 MG PO TBEC
325.0000 mg | DELAYED_RELEASE_TABLET | Freq: Two times a day (BID) | ORAL | Status: DC
  Administered 2015-05-18 – 2015-05-21 (×7): 325 mg via ORAL
  Filled 2015-05-18 (×7): qty 1

## 2015-05-18 MED ORDER — LIDOCAINE HCL (CARDIAC) 20 MG/ML IV SOLN
INTRAVENOUS | Status: DC | PRN
  Administered 2015-05-18: 100 mg via INTRAVENOUS

## 2015-05-18 MED ORDER — ONDANSETRON HCL 4 MG PO TABS: 4.0000 mg | ORAL_TABLET | Freq: Four times a day (QID) | ORAL | Status: DC | PRN

## 2015-05-18 MED ORDER — NITROGLYCERIN 0.4 MG SL SUBL: 0.4000 mg | SUBLINGUAL_TABLET | SUBLINGUAL | Status: DC | PRN

## 2015-05-18 MED ORDER — GLIMEPIRIDE 2 MG PO TABS
3.0000 mg | ORAL_TABLET | Freq: Every day | ORAL | Status: DC
  Administered 2015-05-19 – 2015-05-21 (×3): 3 mg via ORAL
  Filled 2015-05-18 (×6): qty 1

## 2015-05-18 MED ORDER — DEXTROSE 5 % IV SOLN
10.0000 mg | INTRAVENOUS | Status: DC | PRN
  Administered 2015-05-18: 20 ug/min via INTRAVENOUS

## 2015-05-18 MED ORDER — GLIMEPIRIDE 4 MG PO TABS: 2.0000 mg | ORAL_TABLET | Freq: Two times a day (BID) | ORAL | Status: DC

## 2015-05-18 MED ORDER — LABETALOL HCL 5 MG/ML IV SOLN
INTRAVENOUS | Status: DC | PRN
  Administered 2015-05-18: 5 mg via INTRAVENOUS

## 2015-05-18 MED ORDER — 0.9 % SODIUM CHLORIDE (POUR BTL) OPTIME
TOPICAL | Status: DC | PRN
  Administered 2015-05-18: 1000 mL

## 2015-05-18 MED ORDER — ACETAMINOPHEN 650 MG RE SUPP: 650.0000 mg | Freq: Four times a day (QID) | RECTAL | Status: DC | PRN

## 2015-05-18 MED ORDER — VITAMIN D (ERGOCALCIFEROL) 1.25 MG (50000 UNIT) PO CAPS
50000.0000 [IU] | ORAL_CAPSULE | ORAL | Status: DC
  Administered 2015-05-19: 50000 [IU] via ORAL
  Filled 2015-05-18: qty 1

## 2015-05-18 MED ORDER — ONDANSETRON HCL 4 MG/2ML IJ SOLN: 4.0000 mg | Freq: Four times a day (QID) | INTRAMUSCULAR | Status: DC | PRN

## 2015-05-18 MED ORDER — METHOCARBAMOL 1000 MG/10ML IJ SOLN
500.0000 mg | Freq: Four times a day (QID) | INTRAVENOUS | Status: DC | PRN
  Filled 2015-05-18: qty 5

## 2015-05-18 MED ORDER — CLINDAMYCIN PHOSPHATE 600 MG/50ML IV SOLN
600.0000 mg | Freq: Four times a day (QID) | INTRAVENOUS | Status: AC
  Administered 2015-05-18 – 2015-05-19 (×2): 600 mg via INTRAVENOUS
  Filled 2015-05-18 (×2): qty 50

## 2015-05-18 MED ORDER — FENTANYL CITRATE (PF) 100 MCG/2ML IJ SOLN
INTRAMUSCULAR | Status: DC | PRN
  Administered 2015-05-18: 100 ug via INTRAVENOUS
  Administered 2015-05-18 (×3): 50 ug via INTRAVENOUS

## 2015-05-18 MED ORDER — GLIMEPIRIDE 4 MG PO TABS
8.0000 mg | ORAL_TABLET | Freq: Every day | ORAL | Status: DC
  Administered 2015-05-18 – 2015-05-21 (×4): 8 mg via ORAL
  Filled 2015-05-18 (×4): qty 2

## 2015-05-18 MED ORDER — VITAMIN B-12 100 MCG PO TABS
50.0000 ug | ORAL_TABLET | Freq: Every day | ORAL | Status: DC
  Administered 2015-05-19 – 2015-05-21 (×3): 50 ug via ORAL
  Filled 2015-05-18 (×3): qty 1

## 2015-05-18 MED ORDER — PANTOPRAZOLE SODIUM 40 MG PO TBEC
40.0000 mg | DELAYED_RELEASE_TABLET | Freq: Every day | ORAL | Status: DC
  Administered 2015-05-19 – 2015-05-21 (×3): 40 mg via ORAL
  Filled 2015-05-18 (×4): qty 1

## 2015-05-18 MED ORDER — METHOCARBAMOL 500 MG PO TABS
500.0000 mg | ORAL_TABLET | Freq: Four times a day (QID) | ORAL | Status: DC | PRN
  Administered 2015-05-18: 500 mg via ORAL
  Filled 2015-05-18 (×2): qty 1

## 2015-05-18 MED ORDER — METOPROLOL SUCCINATE ER 50 MG PO TB24
50.0000 mg | ORAL_TABLET | Freq: Every day | ORAL | Status: DC
  Administered 2015-05-19 – 2015-05-21 (×3): 50 mg via ORAL
  Filled 2015-05-18 (×4): qty 1

## 2015-05-18 MED ORDER — OXYCODONE HCL 5 MG PO TABS
5.0000 mg | ORAL_TABLET | ORAL | Status: DC | PRN
  Administered 2015-05-19: 10 mg via ORAL
  Administered 2015-05-19: 5 mg via ORAL
  Administered 2015-05-19 – 2015-05-20 (×2): 10 mg via ORAL
  Filled 2015-05-18 (×6): qty 2
  Filled 2015-05-18: qty 1

## 2015-05-18 MED ORDER — ACETAMINOPHEN 325 MG PO TABS
650.0000 mg | ORAL_TABLET | Freq: Four times a day (QID) | ORAL | Status: DC | PRN
  Administered 2015-05-19 – 2015-05-21 (×5): 650 mg via ORAL
  Filled 2015-05-18 (×5): qty 2

## 2015-05-18 MED ORDER — SODIUM CHLORIDE 0.9 % IR SOLN
Status: DC | PRN
  Administered 2015-05-18: 3000 mL

## 2015-05-18 MED ORDER — NEOSTIGMINE METHYLSULFATE 10 MG/10ML IV SOLN
INTRAVENOUS | Status: DC | PRN
  Administered 2015-05-18: 5 mg via INTRAVENOUS

## 2015-05-18 MED ORDER — ISOSORBIDE MONONITRATE ER 60 MG PO TB24
60.0000 mg | ORAL_TABLET | Freq: Every day | ORAL | Status: DC
  Administered 2015-05-19 – 2015-05-21 (×3): 60 mg via ORAL
  Filled 2015-05-18 (×3): qty 1

## 2015-05-18 MED ORDER — EPHEDRINE SULFATE 50 MG/ML IJ SOLN
INTRAMUSCULAR | Status: DC | PRN
  Administered 2015-05-18: 15 mg via INTRAVENOUS
  Administered 2015-05-18: 10 mg via INTRAVENOUS

## 2015-05-18 MED ORDER — DIPHENHYDRAMINE HCL 12.5 MG/5ML PO ELIX: 12.5000 mg | ORAL_SOLUTION | ORAL | Status: DC | PRN

## 2015-05-18 MED ORDER — PHENOL 1.4 % MT LIQD
1.0000 | OROMUCOSAL | Status: DC | PRN
Start: 2015-05-18 — End: 2015-05-21

## 2015-05-18 MED ORDER — DOCUSATE SODIUM 100 MG PO CAPS
100.0000 mg | ORAL_CAPSULE | Freq: Two times a day (BID) | ORAL | Status: DC
Start: 2015-05-18 — End: 2015-05-21
  Administered 2015-05-18 – 2015-05-21 (×6): 100 mg via ORAL
  Filled 2015-05-18 (×6): qty 1

## 2015-05-18 MED ORDER — DEXAMETHASONE SODIUM PHOSPHATE 4 MG/ML IJ SOLN
INTRAMUSCULAR | Status: DC | PRN
Start: 2015-05-18 — End: 2015-05-18
  Administered 2015-05-18: 4 mg via INTRAVENOUS

## 2015-05-18 MED ORDER — ROCURONIUM BROMIDE 100 MG/10ML IV SOLN
INTRAVENOUS | Status: DC | PRN
  Administered 2015-05-18: 50 mg via INTRAVENOUS

## 2015-05-18 MED ORDER — DEXTROSE 50 % IV SOLN
25.0000 mL | Freq: Once | INTRAVENOUS | Status: DC
  Filled 2015-05-18: qty 50

## 2015-05-18 MED ORDER — FENTANYL CITRATE (PF) 250 MCG/5ML IJ SOLN
INTRAMUSCULAR | Status: AC
  Filled 2015-05-18: qty 5

## 2015-05-18 MED ORDER — GLYCOPYRROLATE 0.2 MG/ML IJ SOLN
INTRAMUSCULAR | Status: DC | PRN
  Administered 2015-05-18: .8 mg via INTRAVENOUS

## 2015-05-18 SURGICAL SUPPLY — 49 items
BENZOIN TINCTURE PRP APPL 2/3 (GAUZE/BANDAGES/DRESSINGS) IMPLANT
BLADE SAW SGTL 18X1.27X75 (BLADE) ×2 IMPLANT
BLADE SURG ROTATE 9660 (MISCELLANEOUS) IMPLANT
CAPT HIP TOTAL 2 ×2 IMPLANT
CELLS DAT CNTRL 66122 CELL SVR (MISCELLANEOUS) ×1 IMPLANT
COVER SURGICAL LIGHT HANDLE (MISCELLANEOUS) ×2 IMPLANT
DRAPE C-ARM 42X72 X-RAY (DRAPES) ×2 IMPLANT
DRAPE STERI IOBAN 125X83 (DRAPES) ×2 IMPLANT
DRAPE U-SHAPE 47X51 STRL (DRAPES) ×2 IMPLANT
DRSG AQUACEL AG ADV 3.5X10 (GAUZE/BANDAGES/DRESSINGS) ×2 IMPLANT
DURAPREP 26ML APPLICATOR (WOUND CARE) ×2 IMPLANT
ELECT BLADE 4.0 EZ CLEAN MEGAD (MISCELLANEOUS) ×2
ELECT BLADE 6.5 EXT (BLADE) ×2 IMPLANT
ELECT REM PT RETURN 9FT ADLT (ELECTROSURGICAL) ×2
ELECTRODE BLDE 4.0 EZ CLN MEGD (MISCELLANEOUS) ×1 IMPLANT
ELECTRODE REM PT RTRN 9FT ADLT (ELECTROSURGICAL) ×1 IMPLANT
FACESHIELD WRAPAROUND (MASK) ×6 IMPLANT
GLOVE BIOGEL PI IND STRL 8 (GLOVE) ×2 IMPLANT
GLOVE BIOGEL PI INDICATOR 8 (GLOVE) ×2
GLOVE ECLIPSE 8.0 STRL XLNG CF (GLOVE) ×2 IMPLANT
GLOVE ORTHO TXT STRL SZ7.5 (GLOVE) ×2 IMPLANT
GOWN STRL REUS W/ TWL LRG LVL3 (GOWN DISPOSABLE) ×2 IMPLANT
GOWN STRL REUS W/ TWL XL LVL3 (GOWN DISPOSABLE) ×2 IMPLANT
GOWN STRL REUS W/TWL LRG LVL3 (GOWN DISPOSABLE) ×2
GOWN STRL REUS W/TWL XL LVL3 (GOWN DISPOSABLE) ×2
HANDPIECE INTERPULSE COAX TIP (DISPOSABLE) ×1
KIT BASIN OR (CUSTOM PROCEDURE TRAY) ×2 IMPLANT
KIT ROOM TURNOVER OR (KITS) ×2 IMPLANT
MANIFOLD NEPTUNE II (INSTRUMENTS) ×2 IMPLANT
NS IRRIG 1000ML POUR BTL (IV SOLUTION) ×2 IMPLANT
PACK TOTAL JOINT (CUSTOM PROCEDURE TRAY) ×2 IMPLANT
PACK UNIVERSAL I (CUSTOM PROCEDURE TRAY) ×2 IMPLANT
PAD ARMBOARD 7.5X6 YLW CONV (MISCELLANEOUS) ×2 IMPLANT
RTRCTR WOUND ALEXIS 18CM MED (MISCELLANEOUS) ×2
SET HNDPC FAN SPRY TIP SCT (DISPOSABLE) ×1 IMPLANT
STAPLER VISISTAT 35W (STAPLE) IMPLANT
STRIP CLOSURE SKIN 1/2X4 (GAUZE/BANDAGES/DRESSINGS) IMPLANT
SUT ETHIBOND NAB CT1 #1 30IN (SUTURE) ×4 IMPLANT
SUT MNCRL AB 4-0 PS2 18 (SUTURE) ×2 IMPLANT
SUT VIC AB 0 CT1 27 (SUTURE) ×2
SUT VIC AB 0 CT1 27XBRD ANBCTR (SUTURE) ×2 IMPLANT
SUT VIC AB 1 CT1 27 (SUTURE) ×2
SUT VIC AB 1 CT1 27XBRD ANBCTR (SUTURE) ×2 IMPLANT
SUT VIC AB 2-0 CT1 27 (SUTURE) ×2
SUT VIC AB 2-0 CT1 TAPERPNT 27 (SUTURE) ×2 IMPLANT
TOWEL OR 17X24 6PK STRL BLUE (TOWEL DISPOSABLE) ×2 IMPLANT
TOWEL OR 17X26 10 PK STRL BLUE (TOWEL DISPOSABLE) ×2 IMPLANT
TRAY FOLEY CATH 16FRSI W/METER (SET/KITS/TRAYS/PACK) IMPLANT
WATER STERILE IRR 1000ML POUR (IV SOLUTION) IMPLANT

## 2015-05-18 NOTE — Brief Op Note (Signed)
05/18/2015  2:27 PM  PATIENT:  [name]  74 y.o. female  PRE-OPERATIVE DIAGNOSIS:  Primary osteoarthritis left hip  POST-OPERATIVE DIAGNOSIS:  Primary osteoarthritis left hip  PROCEDURE:  Procedure(s): LEFT TOTAL HIP ARTHROPLASTY ANTERIOR APPROACH (Left)  SURGEON:  Surgeon(s) and Role:    * Mcarthur Rossetti, MD - Primary  PHYSICIAN ASSISTANT: Benita Stabile, PA-C  ANESTHESIA:   general  EBL:  Total I/O In: -  Out: 250 [Blood:250]  BLOOD ADMINISTERED:none  DRAINS: none   LOCAL MEDICATIONS USED:  NONE  SPECIMEN:  No Specimen  DISPOSITION OF SPECIMEN:  N/A  COUNTS:  YES  TOURNIQUET:  * No tourniquets in log *  DICTATION: .Other Dictation: Dictation Number [id number]  PLAN OF CARE: Admit to inpatient   PATIENT DISPOSITION:  PACU - hemodynamically stable.   Delay start of Pharmacological VTE agent (>24hrs) due to surgical blood loss or risk of bleeding: no

## 2015-05-18 NOTE — H&P (Signed)
TOTAL HIP ADMISSION H&P  Patient is admitted for left total hip arthroplasty.  Subjective:  Chief Complaint: left hip pain  HPI: [name], 74 y.o. female, has a history of pain and functional disability in the left hip(s) due to arthritis and patient has failed non-surgical conservative treatments for greater than 12 weeks to include NSAID's and/or analgesics, corticosteriod injections, flexibility and strengthening excercises, use of assistive devices, weight reduction as appropriate and activity modification.  Onset of symptoms was gradual starting 3 years ago with gradually worsening course since that time.The patient noted no past surgery on the left hip(s).  Patient currently rates pain in the left hip at 10 out of 10 with activity. Patient has night pain, worsening of pain with activity and weight bearing, trendelenberg gait, pain that interfers with activities of daily living and pain with passive range of motion. Patient has evidence of subchondral cysts, subchondral sclerosis, periarticular osteophytes and joint space narrowing by imaging studies. This condition presents safety issues increasing the risk of falls.  There is no current active infection.  Patient Active Problem List   Diagnosis Date Noted  . Osteoarthritis of left hip 05/18/2015  . Coronary artery disease involving native coronary artery of native heart with angina pectoris ([place]) 04/16/2015  . History of stroke 04/16/2015  . Type 2 diabetes mellitus with circulatory disorder ([place]) 04/16/2015  . Cerebrovascular 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 artery ([place]) 04/16/2015  . HLD (hyperlipidemia)   . Acute CVA (cerebrovascular accident) ([place]) 02/19/2015  . Transient ischemic attack (TIA) 02/18/2015  . Stroke ([place])   . Cerebral infarction due to thrombosis of right middle cerebral artery ([place])   . Acute myocardial infarction of inferolateral wall (HCC) 04/12/2014  . Numbness 03/18/2014  . TIA  (transient ischemic attack) 03/18/2014  . DM (diabetes mellitus) type II uncontrolled, periph vascular disorder ([place]) 03/18/2014  . Hyperlipidemia   . Hypertension   . PVD (peripheral vascular disease) ([place]) 03/05/2014  . Transient cerebral ischemia 08/01/2012  . Atherosclerosis of native arteries of extremity with intermittent claudication ([place]) 10/19/2011  . Unspecified disorders of arteries and arterioles 10/19/2011  . PAD (peripheral artery disease) ([place]) 04/13/2011   Past Medical History  Diagnosis Date  . Peripheral vascular disease ([place])   . Hyperlipidemia   . Hypertension   . TIA (transient ischemic attack)   . Arthritis     osteoarthritis  . Aneurysm ([place])   . AAA (abdominal aortic aneurysm) ([place])   . Myocardial infarction ([place])   . Stroke ([place]) 01/2015    with left side weakness   . Pneumonia   . Diabetes mellitus     Type 2, on Glimiperide  . Kidney stones   . GERD (gastroesophageal reflux disease)   . Constipation   . Glaucoma   . Urinary incontinence     Past Surgical History  Procedure Laterality Date  . Joint replacement      left knee  . Uterine tumor    . Ventral hernia repair    . Abdominal aortic aneurysm repair    . Abdominal hysterectomy      partial  . Tubal ligation    . Back surgery    . Abdominal aortic aneurysm repair  ?2012  . Abdominal aortagram N/A 03/13/2014    Procedure: ABDOMINAL AORTAGRAM;  Surgeon: Elam Dutch, MD;  Location: [facility] CATH LAB;  Service: Cardiovascular;  Laterality: N/A;  . Left heart catheterization with coronary angiogram N/A 04/12/2014    Procedure:  LEFT HEART CATHETERIZATION WITH CORONARY ANGIOGRAM;  Surgeon: Clent Demark, MD;  Location: [facility] CATH LAB;  Service: Cardiovascular;  Laterality: N/A;  . Percutaneous coronary stent intervention (pci-s)  04/12/2014    Procedure: PERCUTANEOUS CORONARY STENT INTERVENTION (PCI-S);  Surgeon: Clent Demark, MD;  Location: [facility] CATH LAB;  Service: Cardiovascular;;  prox and mid  RCA  . Femoral-popliteal bypass graft Left 07/06/2014    Procedure: BYPASS GRAFT FEMORAL-POPLITEAL ARTERY;  Surgeon: Elam Dutch, MD;  Location: [place];  Service: Vascular;  Laterality: Left;  Marland Kitchen Eye surgery Right     laser surgery for blood behind eye, loss of sight  . Cardiac catheterization    . Coronary angioplasty  03/2015    No prescriptions prior to admission   Allergies  Allergen Reactions  . Codeine Other (See Comments)    Knocked her out of equilibrium. Made her world flip upside down  . Lipitor [Atorvastatin] Other (See Comments)    Severe pain/cramping in legs  . Azor [Amlodipine-Olmesartan] Swelling, Palpitations and Rash    Amlodipine alone resumed 12/2013, tolerating  . Lisinopril Cough  . Penicillins Other (See Comments)    Makes exzcema worse    Social History  Substance Use Topics  . Smoking status: Current Every Day Smoker -- 0.50 packs/day    Types: Cigarettes  . Smokeless tobacco: Never Used  . Alcohol Use: No    Family History  Problem Relation Age of Onset  . Diabetes Mother   . Stroke Mother   . Hypertension Father   . Stroke Father   . Hyperlipidemia Sister   . Hypertension Sister   . Aneurysm Sister   . Hyperlipidemia Brother   . Hypertension Brother      Review of Systems  Musculoskeletal: Positive for joint pain.  All other systems reviewed and are negative.   Objective:  Physical Exam  Constitutional: She is oriented to person, place, and time. She appears well-developed and well-nourished.  HENT:  Head: Normocephalic and atraumatic.  Eyes: EOM are normal. Pupils are equal, round, and reactive to light.  Neck: Normal range of motion. Neck supple.  Cardiovascular: Normal rate.   Respiratory: Effort normal and breath sounds normal.  GI: Soft. Bowel sounds are normal.  Musculoskeletal:       Left hip: She exhibits decreased range of motion, decreased strength, tenderness and bony tenderness.  Neurological: She is alert and oriented  to person, place, and time.  Skin: Skin is warm and dry.  Psychiatric: She has a normal mood and affect.    Vital signs in last 24 hours:    Labs:   Estimated body mass index is 27.53 kg/(m^2) as calculated from the following:   Height as of 04/16/15: '5\' 7"'$  (1.702 m).   Weight as of 04/16/15: 79.742 kg (175 lb 12.8 oz).   Imaging Review Plain radiographs demonstrate severe degenerative joint disease of the left hip(s). The bone quality appears to be good for age and reported activity level.  Assessment/Plan:  End stage arthritis, left hip(s)  The patient history, physical examination, clinical judgement of the provider and imaging studies are consistent with end stage degenerative joint disease of the left hip(s) and total hip arthroplasty is deemed medically necessary. The treatment options including medical management, injection therapy, arthroscopy and arthroplasty were discussed at length. The risks and benefits of total hip arthroplasty were presented and reviewed. The risks due to aseptic loosening, infection, stiffness, dislocation/subluxation,  thromboembolic complications and other imponderables were discussed.  The  patient acknowledged the explanation, agreed to proceed with the plan and consent was signed. Patient is being admitted for inpatient treatment for surgery, pain control, PT, OT, prophylactic antibiotics, VTE prophylaxis, progressive ambulation and ADL's and discharge planning.The patient is planning to be discharged home with home health services

## 2015-05-18 NOTE — Transfer of Care (Signed)
Immediate Anesthesia Transfer of Care Note  Patient: [name]  Procedure(s) Performed: Procedure(s): LEFT TOTAL HIP ARTHROPLASTY ANTERIOR APPROACH (Left)  Patient Location: PACU  Anesthesia Type:General  Level of Consciousness: awake, alert , oriented and patient cooperative  Airway & Oxygen Therapy: Patient Spontanous Breathing and Patient connected to face mask oxygen  Post-op Assessment: Report given to RN and Post -op Vital signs reviewed and stable  Post vital signs: Reviewed and stable  Last Vitals:  Filed Vitals:   05/18/15 0935  BP: 179/65  Pulse: 62  Temp: 36.8 C  Resp: 18    Complications: No apparent anesthesia complications

## 2015-05-18 NOTE — Op Note (Signed)
NAMEKATHALINA, [name]              ACCOUNT NO.:  [id number]  MEDICAL RECORD NO.:  [id number]  LOCATION:  5N10C                        FACILITY:  [facility]  PHYSICIAN:  Lind Guest. Ninfa Linden, M.D.DATE OF BIRTH:  August 27, 1940  DATE OF PROCEDURE:  05/18/2015 DATE OF DISCHARGE:                              OPERATIVE REPORT   PREOPERATIVE DIAGNOSIS:  Primary osteoarthritis and degenerative joint disease, left hip.  POSTOPERATIVE DIAGNOSIS:  Primary osteoarthritis and degenerative joint disease, left hip.  PROCEDURE:  Left total hip arthroplasty through direct anterior approach.  IMPLANTS:  DePuy Sector Gription acetabular component size 50, size 32+ 0 neutral polyethylene liner, size 12 Corail femoral component with standard offset, size 32+ 5 ceramic hip ball.  SURGEON:  Lind Guest. Ninfa Linden, M.D.  ASSISTANT:  Erskine Emery, P.A.-C.  ANESTHESIA:  General.  ANTIBIOTICS:  900 mg IV clindamycin.  BLOOD LOSS:  250 mL.  COMPLICATIONS:  None.  INDICATIONS:  [name] is a very pleasant 74 year old female with debilitating arthritis involving her left hip.  Her pain is daily, it is affecting her activities of daily living, her mobility significantly due to her recent stroke as well.  Given her severity of her pain and how this is affecting detrimentally her mobility, her quality of life, and her activities of daily living; she does wish to proceed with a total hip arthroplasty through direct anterior approach.  The risks and benefits of surgery were explained to her in detail, and she does wish to proceed given the pain she is having and understanding the risks. Her family is in agreement of this as well.  PROCEDURE DESCRIPTION:  After informed consent was obtained, appropriate left hip was marked.  She was brought to the operating room.  General anesthesia was obtained while she was on a stretcher.  Traction boots were placed on both of her feet.  Next, she was placed supine  on the Hana fracture table with the perineal post in place and both legs in inline with skeletal traction, but no traction applied.  Her left operative hip was then prepped and draped with DuraPrep and sterile drapes.  Time-out was called.  She was identified as correct patient, correct left hip.  We then made an incision inferior and posterior to the anterosuperior iliac spine and carried this obliquely down the leg. We dissected down the tensor fascia lata muscle and the tensor fascia was then divided longitudinally, so we could proceed with a direct anterior approach to the hip.  We identified and cauterized the lateral femoral circumflex vessels and then identified the hip capsule, placed a Cobra retractor on the medial and lateral femoral neck.  We then opened up the hip capsule in L-type format placing the Cobra retractors within the hip capsule.  We then made our femoral neck cut with an oscillating saw proximal to the lesser trochanter and completed this with an osteotome.  We placed a corkscrew guide in the femoral head, and the femoral head was removed in its entirety and found to be devoid of cartilage.  We then placed a bent Hohmann of the medial acetabular rim and cleaned the remnants of acetabular labrum from the acetabulum and the other debris.  We  then began reaming under direct visualization from a size 42 reamer up to a size 50 with all reamers under direct visualization and the last reamer under direct fluoroscopy so we could get our depth of reaming, inclination, and anteversion.  Once we were pleased with this, we placed the real DePuy Sector Gription acetabular component size 50 and a 32+ 0 neutral polyethylene liner for that size acetabular component.  Attention was then turned to the femur.  With the leg externally rotated to 100 degrees, extended and adducted, we were able to place Mueller retractor medially and a Hohmann retractor behind the greater trochanter.   Released the lateral joint capsule and used a box cutting osteotome to enter the femoral canal and a rongeur to lateralize.  I then began broaching from a size 8 broach up to a size 12 broach.  With 12 broach in place and feeling tight, we trialed a standard neck and a 32+ 1 hip ball.  We brought the leg back over and up with traction and internal rotation, reducing the pelvis, we were pleased with the offset and range of motion, we felt like we go a little bit longer on her leg length.  We dislocated the hip and removed the trial components.  We placed the real Corail femoral component size 12 and a real 32+ 5 ceramic hip ball and reduced into the pelvis and we were pleased with leg length, offset, and stability as well as range of motion.  We then irrigated the soft tissues with normal saline solution. There was no joint capsule closed.  We closed the iliotibial band with interrupted #1 Vicryl suture followed by 0 Vicryl in the deep tissue, 2- 0 Vicryl in the subcutaneous tissue, 4-0 Monocryls subcuticular stitch, and Steri-Strips on the skin.  An Aquacel dressing was applied.  She was awakened and taken off the Hana table, extubated and taken to the recovery room in stable condition.  All final counts were correct. There were no complications noted.  Of note, Erskine Emery, P.A.-C., assisted in the entire case.  His assistance was crucial for facilitating all aspects of this case.     Lind Guest. Ninfa Linden, M.D.     CYB/MEDQ  D:  05/18/2015  T:  05/18/2015  Job:  [id number]

## 2015-05-18 NOTE — Anesthesia Preprocedure Evaluation (Addendum)
Anesthesia Evaluation  Patient identified by MRN, date of birth, ID band Patient awake    Reviewed: Allergy & Precautions, H&P , NPO status , Patient's Chart, lab work & pertinent test results, reviewed documented beta blocker date and time   Airway Mallampati: II  TM Distance: >3 FB Neck ROM: Full    Dental no notable dental hx. (+) Upper Dentures, Lower Dentures, Dental Advisory Given   Pulmonary Current Smoker,    Pulmonary exam normal breath sounds clear to auscultation       Cardiovascular hypertension, Pt. on medications and Pt. on home beta blockers + CAD, + Past MI, + Cardiac Stents and + Peripheral Vascular Disease   Rhythm:Regular Rate:Normal     Neuro/Psych TIACVA negative psych ROS   GI/Hepatic Neg liver ROS, GERD  Medicated and Controlled,  Endo/Other  diabetes, Type 2, Oral Hypoglycemic Agents  Renal/GU negative Renal ROS  negative genitourinary   Musculoskeletal  (+) Arthritis , Osteoarthritis,    Abdominal   Peds  Hematology negative hematology ROS (+)   Anesthesia Other Findings   Reproductive/Obstetrics negative OB ROS                            Anesthesia Physical Anesthesia Plan  ASA: III  Anesthesia Plan: General   Post-op Pain Management:    Induction: Intravenous  Airway Management Planned: Oral ETT  Additional Equipment:   Intra-op Plan:   Post-operative Plan: Extubation in OR  Informed Consent: I have reviewed the patients History and Physical, chart, labs and discussed the procedure including the risks, benefits and alternatives for the proposed anesthesia with the patient or authorized representative who has indicated his/her understanding and acceptance.   Dental advisory given  Plan Discussed with: CRNA  Anesthesia Plan Comments:         Anesthesia Quick Evaluation

## 2015-05-18 NOTE — Progress Notes (Signed)
Patient blood glucose trending down, notified anesthesiologist @ this time, new orders received. Will continue to monitor closely.

## 2015-05-18 NOTE — Anesthesia Procedure Notes (Signed)
Procedure Name: Intubation Performed by: Judeth Cornfield T Pre-anesthesia Checklist: Patient identified, Emergency Drugs available, Suction available, Patient being monitored and Timeout performed Patient Re-evaluated:Patient Re-evaluated prior to inductionOxygen Delivery Method: Circle system utilized Preoxygenation: Pre-oxygenation with 100% oxygen Intubation Type: IV induction Ventilation: Mask ventilation without difficulty Laryngoscope Size: Mac and 3 Grade View: Grade I Tube type: Oral Tube size: 7.0 mm Number of attempts: 1 Airway Equipment and Method: Stylet Placement Confirmation: ETT inserted through vocal cords under direct vision,  positive ETCO2,  CO2 detector and breath sounds checked- equal and bilateral Secured at: 21 cm Tube secured with: Tape Dental Injury: Teeth and Oropharynx as per pre-operative assessment

## 2015-05-19 ENCOUNTER — Encounter (HOSPITAL_COMMUNITY): Payer: Self-pay | Admitting: Orthopaedic Surgery

## 2015-05-19 LAB — BASIC METABOLIC PANEL
ANION GAP: 11 (ref 5–15)
BUN: 19 mg/dL (ref 6–20)
CALCIUM: 9.2 mg/dL (ref 8.9–10.3)
CO2: 21 mmol/L — ABNORMAL LOW (ref 22–32)
Chloride: 103 mmol/L (ref 101–111)
Creatinine, Ser: 1.29 mg/dL — ABNORMAL HIGH (ref 0.44–1.00)
GFR calc Af Amer: 46 mL/min — ABNORMAL LOW (ref >60)
GFR, EST NON AFRICAN AMERICAN: 39 mL/min — AB (ref >60)
GLUCOSE: 224 mg/dL — AB (ref 65–99)
Potassium: 4.6 mmol/L (ref 3.5–5.1)
SODIUM: 135 mmol/L (ref 135–145)

## 2015-05-19 LAB — CBC
HCT: 29.1 % — ABNORMAL LOW (ref 36.0–46.0)
Hemoglobin: 9.7 g/dL — ABNORMAL LOW (ref 12.0–15.0)
MCH: 29.1 pg (ref 26.0–34.0)
MCHC: 33.3 g/dL (ref 30.0–36.0)
MCV: 87.4 fL (ref 78.0–100.0)
PLATELETS: 167 10*3/uL (ref 150–400)
RBC: 3.33 MIL/uL — AB (ref 3.87–5.11)
RDW: 14.9 % (ref 11.5–15.5)
WBC: 9.4 10*3/uL (ref 4.0–10.5)

## 2015-05-19 LAB — GLUCOSE, CAPILLARY: GLUCOSE-CAPILLARY: 228 mg/dL — AB (ref 65–99)

## 2015-05-19 NOTE — Progress Notes (Signed)
Inpatient Diabetes Program Recommendations  AACE/ADA: New Consensus Statement on Inpatient Glycemic Control (2015)  Target Ranges:  Prepandial:   less than 140 mg/dL      Peak postprandial:   less than 180 mg/dL (1-2 hours)      Critically ill patients:  140 - 180 mg/dL   Review of Glycemic Control  Diabetes history: DM 2 Outpatient Diabetes medications: Amaryl 3 mg QAM, 2 mg QPM Current orders for Inpatient glycemic control: Amaryl 3 mg QAM, 8 mg QPM  Inpatient Diabetes Program Recommendations: Correction (SSI): Patient received Decadron '4mg'$  yest. While inpatient and glucose trending in the 200's, please order Novolog Sensitive Correction scale ACHS.  Thanks,  Tama Headings RN, MSN, [facility] Inpatient Diabetes Coordinator Team Pager [phone] (8a-5p)

## 2015-05-19 NOTE — Evaluation (Signed)
Physical Therapy Evaluation Patient Details Name: [name] MRN: [id number] DOB: 28-Sep-1940 Today's Date: 05/19/2015   History of Present Illness  Pt is a 74 yo female admitted 12/20 for elective L THA. 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   Clinical Impression  Pt is s/p THA resulting in the deficits listed below (see PT Problem List). Pt requiring min/modA for mobility and demo's decreased insight to deficits and safety. 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 Pt will benefit from skilled PT to increase their independence and safety with mobility to allow discharge to the venue listed below. If pt doesn't get approved for CIR pt would then need SNF. SPoke with son who reports she will fight it so they are trying to arrange 24/7 assist so they can take her home.     Follow Up Recommendations CIR (pt will need SNF if CIR doesn't approve)    Equipment Recommendations  Rolling walker with 5" wheels (pt has rollator but would benefit from ERW)    Recommendations for Other Services Rehab consult     Precautions / Restrictions Precautions Precautions: Fall Precaution Comments: left sided weakness  Restrictions Weight Bearing Restrictions: Yes LLE Weight Bearing: Weight bearing as tolerated      Mobility  Bed Mobility               General bed mobility comments: pt received sittig up in chair  Transfers Overall transfer level: Needs assistance Equipment used: Rolling walker (2 wheeled) Transfers: Sit to/from Stand Sit to Stand: Min assist         General transfer comment: MOd A to power up from chairPt required cueing for hand placement, technique, sequencing, and safety during transfers.   Ambulation/Gait Ambulation/Gait assistance: Min assist Ambulation Distance (Feet): 60 Feet Assistive device: Rolling walker (2 wheeled) Gait Pattern/deviations: Step-to pattern;Decreased step length - left;Decreased  stance time - left;Antalgic Gait velocity: decreased   General Gait Details: pt with progressive fatigue and weakness, and with L LE buckling when transfering to commode. pt reports "I think I put to much weight on it"  Stairs            Wheelchair Mobility    Modified Rankin (Stroke Patients Only)       Balance Overall balance assessment: Needs assistance Sitting-balance support: No upper extremity supported;Feet supported Sitting balance-Leahy Scale: Good     Standing balance support: Bilateral upper extremity supported Standing balance-Leahy Scale: Poor Standing balance comment: needs UE assist                             Pertinent Vitals/Pain Pain Assessment: Faces Pain Score: 5  Faces Pain Scale: Hurts little more Pain Location: left hip Pain Descriptors / Indicators: Aching;Sore Pain Intervention(s): Monitored during session    Home Living Family/patient expects to be discharged to:: Private residence Living Arrangements: Alone Available Help at Discharge: Family;Available PRN/intermittently Type of Home: House Home Access: Level entry     Home Layout: One level Home Equipment: Walker - 4 wheels;Shower seat;Adaptive equipment      Prior Function Level of Independence: Needs assistance   Gait / Transfers Assistance Needed: has used a rollator for the last couple of months  ADL's / Homemaking Assistance Needed: only baths when daughter comes in the evenings  Comments: daughter and son do the grocery shopping and driving     Hand Dominance  Dominant Hand: Right    Extremity/Trunk Assessment   Upper Extremity Assessment: Defer to OT evaluation       LUE Deficits / Details: weakness due to recent CVA, decreased coordination   Lower Extremity Assessment: LLE deficits/detail   LLE Deficits / Details: pt with good knee ROM, hip flexion 2/5, residiual L hemiplegia from stroke  Cervical / Trunk Assessment: Normal  Communication    Communication: No difficulties  Cognition Arousal/Alertness: Awake/alert Behavior During Therapy: WFL for tasks assessed/performed Overall Cognitive Status: Impaired/Different from baseline Area of Impairment: Safety/judgement         Safety/Judgement: Decreased awareness of safety;Decreased awareness of deficits     General Comments: questionable accuracy with report of PLOF as pt's report of who would be available to assist her changed t/o session. Pt initially reported she would be alone during the day and then once PT recommend Rehab she reports "1 of my 5 kids would be able to check on me."     General Comments      Exercises        Assessment/Plan    PT Assessment Patient needs continued PT services  PT Diagnosis Difficulty walking;Generalized weakness   PT Problem List Decreased strength;Decreased balance;Decreased activity tolerance;Decreased mobility;Decreased range of motion;Decreased coordination;Decreased knowledge of use of DME;Decreased safety awareness  PT Treatment Interventions DME instruction;Gait training;Stair training;Functional mobility training;Therapeutic activities;Therapeutic exercise;Balance training   PT Goals (Current goals can be found in the Care Plan section) Acute Rehab PT Goals Patient Stated Goal: go home PT Goal Formulation: With patient Time For Goal Achievement: 05/26/15 Potential to Achieve Goals: Good    Frequency 7X/week   Barriers to discharge Decreased caregiver support pt alone during the day    Co-evaluation               End of Session Equipment Utilized During Treatment: Gait belt Activity Tolerance: Patient tolerated treatment well Patient left:  (on commode with OT) Nurse Communication: Mobility status         Time: 6579-0383 PT Time Calculation (min) (ACUTE ONLY): 40 min   Charges:   PT Evaluation $Initial PT Evaluation Tier I: 1 Procedure PT Treatments $Gait Training: 8-22 mins $Therapeutic Activity:  8-22 mins   PT G CodesKingsley Callander 05/19/2015, 12:15 PM  Kittie Plater, PT, DPT Pager #: [phone] Office #: [phone]

## 2015-05-19 NOTE — Progress Notes (Signed)
Subjective: 1 Day Post-Op Procedure(s) (LRB): LEFT TOTAL HIP ARTHROPLASTY ANTERIOR APPROACH (Left) Patient reports pain as moderate.  Labs not back yet this am.  Objective: Vital signs in last 24 hours: Temp:  [97.5 F (36.4 C)-98.4 F (36.9 C)] 98.4 F (36.9 C) (12/21 0500) Pulse Rate:  [62-90] 81 (12/21 0500) Resp:  [9-19] 16 (12/21 0500) BP: (135-179)/(58-76) 151/66 mmHg (12/21 0500) SpO2:  [94 %-100 %] 96 % (12/21 0500) Weight:  [78.518 kg (173 lb 1.6 oz)] 78.518 kg (173 lb 1.6 oz) (12/20 0935)  Intake/Output from previous day: 12/20 0701 - 12/21 0700 In: 1360 [P.O.:360; I.V.:1000] Out: 975 [Urine:725; Blood:250] Intake/Output this shift: Total I/O In: -  Out: 725 [Urine:725]  No results for input(s): HGB in the last 72 hours. No results for input(s): WBC, RBC, HCT, PLT in the last 72 hours. No results for input(s): NA, K, CL, CO2, BUN, CREATININE, GLUCOSE, CALCIUM in the last 72 hours. No results for input(s): LABPT, INR in the last 72 hours.  Sensation intact distally Intact pulses distally Dorsiflexion/Plantar flexion intact Incision: scant drainage  Assessment/Plan: 1 Day Post-Op Procedure(s) (LRB): LEFT TOTAL HIP ARTHROPLASTY ANTERIOR APPROACH (Left) Up with therapy Discharge home with home health next 2 days  BLACKMAN,CHRISTOPHER Y 05/19/2015, 6:50 AM

## 2015-05-19 NOTE — Anesthesia Postprocedure Evaluation (Signed)
Anesthesia Post Note  Patient: [name]  Procedure(s) Performed: Procedure(s) (LRB): LEFT TOTAL HIP ARTHROPLASTY ANTERIOR APPROACH (Left)  Patient location during evaluation: PACU Anesthesia Type: General Level of consciousness: awake and alert Pain management: pain level controlled Vital Signs Assessment: post-procedure vital signs reviewed and stable Respiratory status: spontaneous breathing, nonlabored ventilation and respiratory function stable Cardiovascular status: blood pressure returned to baseline and stable Postop Assessment: no signs of nausea or vomiting Anesthetic complications: no    Last Vitals:  Filed Vitals:   05/19/15 0500 05/19/15 1353  BP: 151/66 117/57  Pulse: 81 86  Temp: 36.9 C 37.1 C  Resp: 16 16    Last Pain:  Filed Vitals:   05/19/15 1817  PainSc: 6                  Nashayla Telleria,W. EDMOND

## 2015-05-19 NOTE — Progress Notes (Signed)
Utilization review completed.  

## 2015-05-19 NOTE — Progress Notes (Signed)
Physical Therapy Treatment Patient Details Name: [name] MRN: [id number] DOB: 12-30-1940 Today's Date: 05/19/2015    History of Present Illness Pt is a 74 yo female admitted 12/20 for elective L THA. 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     PT Comments    Patient with ability to ambulate 50 ft with min guard before fatigued and led through AAROM/AROM exercises needing min/mod A at times for completion. Mod A for sup to sit and sit to stand transfers. Continue to progress as tolerated.   Follow Up Recommendations  CIR (pt will need SNF if CIR doesn't approve)     Equipment Recommendations  Rolling walker with 5" wheels (pt has rollator but would benefit from ERW)    Recommendations for Other Services Rehab consult     Precautions / Restrictions Precautions Precautions: Fall Precaution Comments: left sided weakness  Restrictions Weight Bearing Restrictions: Yes LLE Weight Bearing: Weight bearing as tolerated    Mobility  Bed Mobility Overal bed mobility: Needs Assistance Bed Mobility: Supine to Sit     Supine to sit: Mod assist     General bed mobility comments: mod A for bringing bilat LE to EOB and elevating trunk into sitting postion; vc for technique and hand placment; no bed rails and HOB flat  Transfers Overall transfer level: Needs assistance Equipment used: Rolling walker (2 wheeled) Transfers: Sit to/from Stand Sit to Stand: Min assist;Mod assist         General transfer comment: mod A for powering up into standing from EOB with 3 attempts; vc for hand placement and technique; pt reported that her "arms are just tired"  Ambulation/Gait Ambulation/Gait assistance: Min guard Ambulation Distance (Feet): 50 Feet Assistive device: Rolling walker (2 wheeled) Gait Pattern/deviations: Step-to pattern;Decreased stride length;Decreased stance time - left;Antalgic;Trunk flexed Gait velocity: decreased   General  Gait Details: vc for upright posture and encouragement to increased WS to L LE; pt fatigued quickly    Stairs            Wheelchair Mobility    Modified Rankin (Stroke Patients Only)       Balance Overall balance assessment: Needs assistance   Sitting balance-Leahy Scale: Good     Standing balance support: Bilateral upper extremity supported Standing balance-Leahy Scale: Poor                      Cognition Arousal/Alertness: Awake/alert Behavior During Therapy: WFL for tasks assessed/performed Overall Cognitive Status: Impaired/Different from baseline Area of Impairment: Safety/judgement         Safety/Judgement: Decreased awareness of safety;Decreased awareness of deficits     General Comments: questionable accuracy with report of PLOF as pt's report of who would be available to assist her changed t/o session. Pt initially reported she would be alone during the day and then once PT recommend Rehab she reports "1 of my 5 kids would be able to check on me."     Exercises Total Joint Exercises Ankle Circles/Pumps: AROM;Both;15 reps Quad Sets: AROM;Left;10 reps Heel Slides: Left;10 reps;AAROM Hip ABduction/ADduction: AAROM;Right;10 reps    General Comments        Pertinent Vitals/Pain Pain Assessment: Faces Faces Pain Scale: Hurts little more Pain Location: L hip Pain Descriptors / Indicators: Aching;Sore Pain Intervention(s): Limited activity within patient's tolerance;Monitored during session;Premedicated before session    Home Living  Prior Function            PT Goals (current goals can now be found in the care plan section) Acute Rehab PT Goals Patient Stated Goal: go home for Christmas PT Goal Formulation: With patient Time For Goal Achievement: 05/26/15 Potential to Achieve Goals: Good Progress towards PT goals: Progressing toward goals    Frequency  7X/week    PT Plan Current plan remains  appropriate    Co-evaluation             End of Session Equipment Utilized During Treatment: Gait belt Activity Tolerance: Patient limited by fatigue Patient left: in bed;with call bell/phone within reach (on commode with OT)     Time: 7169-6789 PT Time Calculation (min) (ACUTE ONLY): 33 min  Charges:  $Gait Training: 8-22 mins $Therapeutic Exercise: 8-22 mins                    G Codes:      [name], PTA Pager: [phone]   05/19/2015, 4:03 PM

## 2015-05-19 NOTE — Progress Notes (Signed)
Rehab Admissions Coordinator Note:  Patient was screened by Retta Diones for appropriateness for an Inpatient Acute Rehab Consult.  At this time, we are recommending [place]. Patient has [employer].  It is very unlikely that they would approve inpatient rehab admission given current diagnosis.  Call me for questions.    Retta Diones 05/19/2015, 11:07 AM  I can be reached at [phone].

## 2015-05-19 NOTE — Progress Notes (Signed)
MD, please provide an order for sliding scale/meal coverage for patient's diabetes. Last night cbg was 309. Thank you.

## 2015-05-19 NOTE — Evaluation (Signed)
Occupational Therapy Evaluation Patient Details Name: Marquasha Brutus MRN: [id number] DOB: 09/28/1940 Today's Date: 05/19/2015    History of Present Illness Pt is a 74 yo female admitted 12/20 for elective L THA. 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    Clinical Impression   Patient presenting with decreased ADL and functional mobility independence secondary to above. Pt continues to be somewhat limited secondary to recent CVA. Patient mod I, using rollator PTA. Patient currently functioning at an overall min assist level for functional transfers/mobility using RW and requires up to mod assist for LB ADLs. Patient will benefit from acute OT to increase overall independence in the areas of ADLs, functional mobility, and overall safety in order to safely discharge to venue listed below.   Pt reports that she has 5 children and 14 grandchildren. She reports that they are in/out during the day, and that one of her daughters plans to continue assisting at night times with showers. At this time, pt needs 24/7 supervision/safety to ensure her safety with ADLs, IADLs, and functional mobility throughout her house. Pt will benefit from interdisciplinary CIR prior to discharging home to be alone most of the day.   IF pt unable to discharge to CIR, recommending HHOT. Pt is refusing SNF at this time.     Follow Up Recommendations  CIR;Supervision/Assistance - 24 hour    Equipment Recommendations  Tub/shower bench    Recommendations for Other Services  None at this time   Precautions / Restrictions Precautions Precautions: Fall Precaution Comments: left sided weakness  Restrictions Weight Bearing Restrictions: Yes LLE Weight Bearing: Weight bearing as tolerated      Mobility Bed Mobility General bed mobility comments: Pt found in BR post PT session upon OT entering room and pt left seated in recliner upon OT leaving room   Transfers Overall transfer  level: Needs assistance Equipment used: Rolling walker (2 wheeled) Transfers: Sit to/from Stand Sit to Stand: Min assist General transfer comment: Pt required cueing for hand placement, technique, sequencing, and safety during transfers.     Balance Overall balance assessment: Needs assistance Sitting-balance support: No upper extremity supported;Feet supported Sitting balance-Leahy Scale: Good     Standing balance support: Bilateral upper extremity supported;During functional activity Standing balance-Leahy Scale: Fair    ADL Overall ADL's : Needs assistance/impaired Eating/Feeding: Set up;Sitting   Grooming: Min guard;Standing Grooming Details (indicate cue type and reason): at sink Upper Body Bathing: Sitting;Minimal assitance   Lower Body Bathing: Moderate assistance;Sit to/from stand   Upper Body Dressing : Sitting;Minimal assistance   Lower Body Dressing: Moderate assistance;Sit to/from stand   Toilet Transfer: Min guard;Grab bars;BSC;RW;Ambulation   Toileting- Water quality scientist and Hygiene: Min guard;Sit to/from stand       Functional mobility during ADLs: Minimal assistance;Cueing for safety;Rolling walker General ADL Comments: Pt found in BR post PT session, PT made comment that patient with LLE buckling during mobility. Pt stood at sink for grooming task of brushing teeth. Pt required mod verbal cueing for safety throughout session. Pt unsafe to be alone at home at this time.     Pertinent Vitals/Pain Pain Assessment: Faces Faces Pain Scale: Hurts little more Pain Location: left hip Pain Descriptors / Indicators: Aching;Sore Pain Intervention(s): Monitored during session;Repositioned     Hand Dominance Right   Extremity/Trunk Assessment Upper Extremity Assessment Upper Extremity Assessment: LUE deficits/detail LUE Deficits / Details: weakness due to recent CVA, decreased coordination   Lower Extremity Assessment Lower Extremity  Assessment: Defer to  PT evaluation   Cervical / Trunk Assessment Cervical / Trunk Assessment: Normal   Communication Communication Communication: No difficulties   Cognition Arousal/Alertness: Awake/alert Behavior During Therapy: WFL for tasks assessed/performed Overall Cognitive Status: Impaired/Different from baseline Area of Impairment: Safety/judgement         Safety/Judgement: Decreased awareness of safety;Decreased awareness of deficits     General Comments: questionable accuracy with report of PLOF as pt's report of who would be available to assist her changed t/o session. Pt initially reported she would be alone during the day and then once PT recommend Rehab she reports "1 of my 5 kids would be able to check on me."               Home Living Family/patient expects to be discharged to:: Private residence Living Arrangements: Alone Available Help at Discharge: Family;Available PRN/intermittently Type of Home: House Home Access: Level entry     Home Layout: One level     Bathroom Shower/Tub: Teacher, early years/pre: Handicapped height     Home Equipment: Environmental consultant - 4 wheels;Shower seat;Adaptive equipment Adaptive Equipment: Reacher        Prior Functioning/Environment Level of Independence: Needs assistance  Gait / Transfers Assistance Needed: has used a rollator for the last couple of months ADL's / Homemaking Assistance Needed: only baths when daughter comes in the evenings   Comments: daughter and son do the grocery shopping and driving    OT Diagnosis: Generalized weakness;Acute pain   OT Problem List: Decreased strength;Decreased range of motion;Decreased activity tolerance;Impaired balance (sitting and/or standing);Decreased safety awareness;Decreased knowledge of use of DME or AE;Pain   OT Treatment/Interventions: Self-care/ADL training;Therapeutic exercise;Energy conservation;DME and/or AE instruction;Therapeutic activities;Patient/family education;Balance  training    OT Goals(Current goals can be found in the care plan section) Acute Rehab OT Goals Patient Stated Goal: go to CIR or go home  OT Goal Formulation: With patient Time For Goal Achievement: 06/02/15 Potential to Achieve Goals: Good ADL Goals Pt Will Perform Grooming: with modified independence;standing Pt Will Perform Lower Body Bathing: with supervision;with adaptive equipment;sit to/from stand Pt Will Perform Lower Body Dressing: with supervision;with adaptive equipment;sit to/from stand Pt Will Transfer to Toilet: with modified independence;ambulating;bedside commode Pt Will Perform Tub/Shower Transfer: Tub transfer;tub bench;ambulating;rolling walker;with supervision Additional ADL Goal #1: Pt will be mod I with functional mobility/ambulation throughout room using LRAD  OT Frequency: Min 2X/week   Barriers to D/C: Decreased caregiver support   End of Session Equipment Utilized During Treatment: Gait belt;Rolling walker  Activity Tolerance: Patient tolerated treatment well Patient left: in chair;with call bell/phone within reach   Time: 7517-0017 OT Time Calculation (min): 20 min Charges:  OT General Charges $OT Visit: 1 Procedure OT Evaluation $Initial OT Evaluation Tier I: 1 Procedure  Chrys Racer , MS, OTR/L, CLT Pager: [phone]  05/19/2015, 10:21 AM

## 2015-05-19 NOTE — Progress Notes (Signed)
Patient has been unable to void on her own.  Tried warm compressions, got patient up to the bedside commode; ran water, to no avail.  Patient did not have the urge to urinate.  Did bladder scan at 2217, she had 555 mL of urine in her bladder.  Tried to have her void again on her own with no success.  At [place] did an I/O catheter and removed 725 mL of urine.  Patient is currently resting in bed.

## 2015-05-20 LAB — CBC
HEMATOCRIT: 25.9 % — AB (ref 36.0–46.0)
Hemoglobin: 8.7 g/dL — ABNORMAL LOW (ref 12.0–15.0)
MCH: 29.5 pg (ref 26.0–34.0)
MCHC: 33.6 g/dL (ref 30.0–36.0)
MCV: 87.8 fL (ref 78.0–100.0)
Platelets: 149 10*3/uL — ABNORMAL LOW (ref 150–400)
RBC: 2.95 MIL/uL — AB (ref 3.87–5.11)
RDW: 15.1 % (ref 11.5–15.5)
WBC: 10.6 10*3/uL — AB (ref 4.0–10.5)

## 2015-05-20 LAB — GLUCOSE, CAPILLARY
Glucose-Capillary: 167 mg/dL — ABNORMAL HIGH (ref 65–99)
Glucose-Capillary: 167 mg/dL — ABNORMAL HIGH (ref 65–99)
Glucose-Capillary: 110 mg/dL — ABNORMAL HIGH (ref 65–99)

## 2015-05-20 MED ORDER — INSULIN ASPART 100 UNIT/ML ~~LOC~~ SOLN
0.0000 [IU] | Freq: Three times a day (TID) | SUBCUTANEOUS | Status: DC
  Administered 2015-05-20: 3 [IU] via SUBCUTANEOUS
  Administered 2015-05-21 (×2): 2 [IU] via SUBCUTANEOUS

## 2015-05-20 MED ORDER — AMLODIPINE BESYLATE 5 MG PO TABS
5.0000 mg | ORAL_TABLET | Freq: Two times a day (BID) | ORAL | Status: DC
  Administered 2015-05-20 – 2015-05-21 (×3): 5 mg via ORAL
  Filled 2015-05-20 (×4): qty 1

## 2015-05-20 NOTE — Progress Notes (Signed)
Physical Therapy Treatment Patient Details Name: [name] MRN: [id number] DOB: [birth year] Today's Date: 05/20/2015    History of Present Illness Pt is a 74 yo female admitted 12/20 for elective L THA. 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     PT Comments    Patient requiring min/mod A for bed mobility and transfers. Pt with ability to ambulate 64f before becoming dizzy/nauseated today with RN aware. Pt is addiment about d/c home and PTA spoke with pt about PT's recommendation for further rehabilitation before returning home due to her level of assistance needed. Pt reported that she would have 24 hour assistance when she is at home. Due to CIR denial, continuing to recommend SNF for further skilled PT services for increased independence and safety with mobility.    Follow Up Recommendations  SNF     Equipment Recommendations  Rolling walker with 5" wheels    Recommendations for Other Services Rehab consult     Precautions / Restrictions Precautions Precautions: Fall Precaution Comments: left sided weakness  Restrictions Weight Bearing Restrictions: Yes LLE Weight Bearing: Weight bearing as tolerated    Mobility  Bed Mobility Overal bed mobility: Needs Assistance Bed Mobility: Supine to Sit     Supine to sit: Mod assist     General bed mobility comments: mod A for bringing bilat LE to EOB and elevating trunk into sitting postion; vc for technique and hand placment; HOB elevated 25 degrees  Transfers Overall transfer level: Needs assistance Equipment used: Rolling walker (2 wheeled) Transfers: Sit to/from Stand Sit to Stand: Mod assist;Min assist         General transfer comment: min A from commode with use of R hand rail; mod A required for sitting from EOB with  multiple attempts before achieving standing position; vc for hand and foot placement and technique  Ambulation/Gait Ambulation/Gait assistance: Min  guard Ambulation Distance (Feet): 15 Feet (X 2) Assistive device: Rolling walker (2 wheeled) Gait Pattern/deviations: Step-to pattern;Decreased stride length;Decreased stance time - left;Antalgic;Trunk flexed Gait velocity: decreased   General Gait Details: verbal and tactile cues for upright posture; pt with ability to correct posture in standing but unable while ambulating; pt reported that she felt "whoozy" after initial 15 ft; PTA checked pt's O2 once in sitting and it remained at 98-99% on room air; RN made aware of pt's c/o dizziness and nausea   Stairs            Wheelchair Mobility    Modified Rankin (Stroke Patients Only)       Balance Overall balance assessment: Needs assistance Sitting-balance support: Feet supported Sitting balance-Leahy Scale: Good     Standing balance support: Bilateral upper extremity supported Standing balance-Leahy Scale: Poor                      Cognition Arousal/Alertness: Awake/alert Behavior During Therapy: WFL for tasks assessed/performed Overall Cognitive Status: Impaired/Different from baseline Area of Impairment: Safety/judgement         Safety/Judgement: Decreased awareness of safety;Decreased awareness of deficits     General Comments: questionable accuracy with report of PLOF as pt's report of who would be available to assist her changed t/o session. Pt initially reported she would be alone during the day and then once PT recommend Rehab she reports "1 of my 5 kids would be able to check on me."     Exercises      General Comments  Pertinent Vitals/Pain Pain Assessment: 0-10 Pain Score: 6  Pain Location: L hip Pain Descriptors / Indicators: Sore Pain Intervention(s): Limited activity within patient's tolerance;Monitored during session    Home Living                      Prior Function            PT Goals (current goals can now be found in the care plan section) Acute Rehab PT  Goals Patient Stated Goal: go home for Christmas PT Goal Formulation: With patient Time For Goal Achievement: 05/26/15 Potential to Achieve Goals: Good Progress towards PT goals: Not progressing toward goals - comment    Frequency  7X/week    PT Plan Current plan remains appropriate    Co-evaluation             End of Session Equipment Utilized During Treatment: Gait belt Activity Tolerance: Patient limited by fatigue Patient left: in bed;with call bell/phone within reach (on commode with OT)     Time: 1000-1035 PT Time Calculation (min) (ACUTE ONLY): 35 min  Charges:  $Gait Training: 8-22 mins $Therapeutic Activity: 8-22 mins                    G Codes:      [name], PTA Pager: [phone]   05/20/2015, 11:56 AM

## 2015-05-20 NOTE — Clinical Social Work Note (Signed)
CSW met with patient and her son to discuss SNF placement.  Patient stated she does not want to go to a SNF and would like home health.  Patient and son confirmed that she will have 24 hour assistance in her home for a few days.  CSW explained the differences between home health and going to a SNF and patient still stated she does not want to go to SNF.  CSW left message for case manager.  CSW to sign off please reconsult if other social work needs arise.  Jones Broom. [place], MSW, [place] 05/20/2015 7:04 PM

## 2015-05-20 NOTE — Progress Notes (Signed)
PT Cancellation Note  Patient Details Name: [name] MRN: [id number] DOB: July 18, 1940   Cancelled Treatment:    Reason Eval/Treat Not Completed: Patient declined, no reason specified (Pt reported feeling nauseated. ) PT will check on pt later if time allows.  [name], PTA Pager: [phone]   05/20/2015, 3:02 PM

## 2015-05-20 NOTE — Progress Notes (Addendum)
Inpatient Diabetes Program Recommendations  AACE/ADA: New Consensus Statement on Inpatient Glycemic Control (2015)  Target Ranges:  Prepandial:   less than 140 mg/dL      Peak postprandial:   less than 180 mg/dL (1-2 hours)      Critically ill patients:  140 - 180 mg/dL  Results for [name], [name] (MRN [id number]) as of 05/20/2015 09:36  Ref. Range 05/19/2015 06:27  Glucose-Capillary Latest Ref Range: 65-99 mg/dL 228 (H)   Review of Glycemic Control  Diabetes history: DM 2 Outpatient Diabetes medications: Amaryl 3 mg QAM, Amaryl 2 mg QPM Current orders for Inpatient glycemic control: Amaryl 3 mg QAM, Amaryl 8 mg QPM  Inpatient Diabetes Program Recommendations: Correction (SSI): While inpatient please order CBGs with Novolog correction scale ACHS.  Thanks, Barnie Alderman, RN, MSN, CDE Diabetes Coordinator Inpatient Diabetes Program [phone] (Team Pager from [place] to [place]) [phone] ([facility] office) [phone] [facility] office) [phone] [facility] office)

## 2015-05-20 NOTE — Progress Notes (Signed)
OT Cancellation Note  Patient Details Name: [name] MRN: [id number] DOB: 1941-03-12   Cancelled Treatment:    Reason Eval/Treat Not Completed: Patient declined, no reason specified;Other (comment) (pt reports nausea and feeling like she is hyperventilating). Pt requesting to O2 to decrease feeling of hyperventilation; 2L of O2 applied via nasal canula. RN notified of pts symptoms and that O2 was applied. Will check back as time allows.  Binnie Kand M.S., OTR/L Pager: [phone]  05/20/2015, 3:13 PM

## 2015-05-20 NOTE — Progress Notes (Signed)
Subjective: 2 Days Post-Op Procedure(s) (LRB): LEFT TOTAL HIP ARTHROPLASTY ANTERIOR APPROACH (Left) Patient reports pain as moderate.  Acute blood loss anemia from surgery, but stable vitals.  Working very slowly with therapy.  Objective: Vital signs in last 24 hours: Temp:  [98.6 F (37 C)-98.7 F (37.1 C)] 98.6 F (37 C) (12/22 0521) Pulse Rate:  [73-86] 80 (12/22 0521) Resp:  [16-18] 18 (12/22 0521) BP: (117-164)/(56-64) 164/64 mmHg (12/22 0521) SpO2:  [93 %-96 %] 96 % (12/22 0521)  Intake/Output from previous day: 12/21 0701 - 12/22 0700 In: 1905 [P.O.:1080; I.V.:825] Out: 200 [Urine:200] Intake/Output this shift:     Recent Labs  05/19/15 0655  HGB 9.7*    Recent Labs  05/19/15 0655  WBC 9.4  RBC 3.33*  HCT 29.1*  PLT 167    Recent Labs  05/19/15 0655  NA 135  K 4.6  CL 103  CO2 21*  BUN 19  CREATININE 1.29*  GLUCOSE 224*  CALCIUM 9.2   No results for input(s): LABPT, INR in the last 72 hours.  Sensation intact distally Intact pulses distally Dorsiflexion/Plantar flexion intact Incision: scant drainage  Assessment/Plan: 2 Days Post-Op Procedure(s) (LRB): LEFT TOTAL HIP ARTHROPLASTY ANTERIOR APPROACH (Left) Up with therapy  Patient really wants to go home instead of other options.  If not safe for home, this needs to be communicated to her and her family by therapy so they understand why.  BLACKMAN,CHRISTOPHER Y 05/20/2015, 7:01 AM

## 2015-05-21 LAB — GLUCOSE, CAPILLARY
GLUCOSE-CAPILLARY: 127 mg/dL — AB (ref 65–99)
GLUCOSE-CAPILLARY: 149 mg/dL — AB (ref 65–99)
GLUCOSE-CAPILLARY: 147 mg/dL — AB (ref 65–99)

## 2015-05-21 MED ORDER — ACETAMINOPHEN 325 MG PO TABS: 650.0000 mg | ORAL_TABLET | Freq: Four times a day (QID) | ORAL | Status: DC | PRN

## 2015-05-21 MED ORDER — METHOCARBAMOL 500 MG PO TABS: 500.0000 mg | ORAL_TABLET | Freq: Four times a day (QID) | ORAL | Status: DC | PRN

## 2015-05-21 MED ORDER — ASPIRIN 325 MG PO TBEC: 325.0000 mg | DELAYED_RELEASE_TABLET | Freq: Two times a day (BID) | ORAL | Status: DC

## 2015-05-21 NOTE — Progress Notes (Signed)
Patient ID: [name], female   DOB: [birth year], 74 y.o.   MRN: [id number] I have spoken with the patient and her son.  She will not go to SNF.  They have good family support at home and she did well there after her stroke.  She is stable medically here and can be discharged to home today.

## 2015-05-21 NOTE — Progress Notes (Signed)
PT Cancellation Note  Patient Details Name: [name] MRN: [id number] DOB: April 13, 1941   Cancelled Treatment:    Reason Eval/Treat Not Completed: Patient declined, no reason specified Pt reported that she needs some rest before more PT and that she just had OT. PT will check on pt later as time allows.    [name], PTA Pager: [phone]   05/21/2015, 11:36 AM

## 2015-05-21 NOTE — Discharge Instructions (Signed)

## 2015-05-21 NOTE — Discharge Summary (Signed)
Patient ID: [name] MRN: [id number] DOB/AGE: 1940/10/05 74 y.o.  Admit date: 05/18/2015 Discharge date: 05/21/2015  Admission Diagnoses:  Principal Problem:   Osteoarthritis of left hip Active Problems:   Status post total replacement of left hip   Discharge Diagnoses:  Same  Past Medical History  Diagnosis Date  . Peripheral vascular disease ([place])   . Hyperlipidemia   . Hypertension   . TIA (transient ischemic attack)   . Arthritis     osteoarthritis  . Aneurysm ([place])   . AAA (abdominal aortic aneurysm) ([place])   . Myocardial infarction ([place])   . Stroke ([place]) 01/2015    with left side weakness   . Pneumonia   . Diabetes mellitus     Type 2, on Glimiperide  . Kidney stones   . GERD (gastroesophageal reflux disease)   . Constipation   . Glaucoma   . Urinary incontinence     Surgeries: Procedure(s): LEFT TOTAL HIP ARTHROPLASTY ANTERIOR APPROACH on 05/18/2015   Consultants:    Discharged Condition: Improved  Hospital Course: [name] is an 74 y.o. female who was admitted 05/18/2015 for operative treatment ofOsteoarthritis of left hip. Patient has severe unremitting pain that affects sleep, daily activities, and work/hobbies. After pre-op clearance the patient was taken to the operating room on 05/18/2015 and underwent  Procedure(s): LEFT TOTAL HIP ARTHROPLASTY ANTERIOR APPROACH.    Patient was given perioperative antibiotics: Anti-infectives    Start     Dose/Rate Route Frequency Ordered Stop   05/18/15 1900  clindamycin (CLEOCIN) IVPB 600 mg     600 mg 100 mL/hr over 30 Minutes Intravenous Every 6 hours 05/18/15 1643 05/19/15 0301   05/18/15 1100  clindamycin (CLEOCIN) IVPB 900 mg     900 mg 100 mL/hr over 30 Minutes Intravenous To [facility] 05/17/15 0811 05/18/15 1307       Patient was given sequential compression devices, early ambulation, and chemoprophylaxis to prevent DVT.  Patient benefited maximally from hospital stay and there  were no complications.    Recent vital signs: Patient Vitals for the past 24 hrs:  BP Temp Temp src Pulse Resp SpO2  05/21/15 0540 140/63 mmHg 99 F (37.2 C) Oral 84 18 100 %  05/20/15 2122 (!) 143/64 mmHg 98.6 F (37 C) Oral 80 18 100 %  05/20/15 1351 (!) 151/52 mmHg 98.8 F (37.1 C) Oral 91 18 98 %     Recent laboratory studies:  Recent Labs  05/19/15 0655 05/20/15 0619  WBC 9.4 10.6*  HGB 9.7* 8.7*  HCT 29.1* 25.9*  PLT 167 149*  NA 135  --   K 4.6  --   CL 103  --   CO2 21*  --   BUN 19  --   CREATININE 1.29*  --   GLUCOSE 224*  --   CALCIUM 9.2  --      Discharge Medications:     Medication List    STOP taking these medications        aspirin 81 MG tablet  Replaced by:  aspirin 325 MG EC tablet      TAKE these medications        acetaminophen 325 MG tablet  Commonly known as:  TYLENOL  Take 2 tablets (650 mg total) by mouth every 6 (six) hours as needed for mild pain or moderate pain (or Fever >/= 101).     amLODipine 5 MG tablet  Commonly known as:  NORVASC  Take 5 mg by mouth 2 (  two) times daily.     aspirin 325 MG EC tablet  Take 1 tablet (325 mg total) by mouth 2 (two) times daily after a meal.     esomeprazole 20 MG capsule  Commonly known as:  NEXIUM  Take 20 mg by mouth as needed.     glimepiride 2 MG tablet  Commonly known as:  AMARYL  Take 2-3 mg by mouth 2 (two) times daily. Take 3 mg by mouth in the morning and take 2 mg by mouth in the afternoon (around 6 pm)     isosorbide mononitrate 60 MG 24 hr tablet  Commonly known as:  IMDUR  Take 60 mg by mouth daily.     methocarbamol 500 MG tablet  Commonly known as:  ROBAXIN  Take 1 tablet (500 mg total) by mouth every 6 (six) hours as needed for muscle spasms.     metoprolol succinate 50 MG 24 hr tablet  Commonly known as:  TOPROL-XL  Take 50 mg by mouth daily.     nitroGLYCERIN 0.4 MG SL tablet  Commonly known as:  NITROSTAT  Place 0.4 mg under the tongue every 5 (five) minutes  as needed for chest pain.     pravastatin 20 MG tablet  Commonly known as:  PRAVACHOL  Take 1 tablet (20 mg total) by mouth daily at 6 PM.     ticagrelor 90 MG Tabs tablet  Commonly known as:  BRILINTA  Take 1 tablet (90 mg total) by mouth 2 (two) times daily.     vitamin B-12 50 MCG tablet  Commonly known as:  CYANOCOBALAMIN  Take 50 mcg by mouth daily.     Vitamin D (Ergocalciferol) 50000 UNITS Caps capsule  Commonly known as:  DRISDOL  Take 50,000 Units by mouth every 7 (seven) days. fridays        Diagnostic Studies: Dg Hip Port Unilat With Pelvis 1v Left  05/18/2015  CLINICAL DATA:  Post left total hip arthroplasty. EXAM: DG HIP (WITH OR WITHOUT PELVIS) 1V PORT LEFT COMPARISON:  05/18/2015 FINDINGS: Examination demonstrates evidence of patient's recent left total hip arthroplasty which is intact and normally located. There is mild degenerative change of the right hip. There are degenerative changes of the spine. Remainder of the exam is within normal. IMPRESSION: Left total hip arthroplasty intact without complicating features. Electronically Signed   By: Marin Olp M.D.   On: 05/18/2015 15:21   Dg Hip Operative Unilat With Pelvis Left  05/18/2015  CLINICAL DATA:  Left AA hip. EXAM: OPERATIVE left HIP (WITH PELVIS IF PERFORMED)  VIEWS TECHNIQUE: Fluoroscopic spot image(s) were submitted for interpretation post-operatively. COMPARISON:  CT 04/25/2012 FINDINGS: Initial images demonstrate moderate osteoarthritic change of the left hip and mild osteoarthritic change of the right hip. Final images demonstrate placement of a left total hip arthroplasty with prosthetic components which appear intact and normally located. Recommend correlation with findings at the time of the procedure. IMPRESSION: Left total hip arthroplasty without complicating features. Electronically Signed   By: Marin Olp M.D.   On: 05/18/2015 14:43    Disposition: 06-Home-Health Care Svc      Discharge  Instructions    Discharge patient    Complete by:  As directed            Follow-up Information    Follow up with Mcarthur Rossetti, MD In 2 weeks.   Specialty:  Orthopedic Surgery   Contact information:   [place] [address] [phone]  SignedMcarthur Rossetti 05/21/2015, 6:45 AM

## 2015-05-21 NOTE — Care Management Note (Addendum)
Case Management Note  Patient Details  Name: [name] MRN: [id number] Date of Birth: 15-May-1941  Subjective/Objective:         S/p left total hip arthroplasty           Action/Plan: PT recommended SNF but patient refusing to go to SNF. Per patient and per her son, patient will have family to assist her 24/7. Set up with [facility] for HHPT by MD office. Patient agreeable to HHPT with [place]. Patient states that she has a rollator and 3N1 but not a rolling walker. Contacted Jermaine at [facility] and requested rolling walker be delivered to patient's room. OT recommending [place]. Contacted Tim with [facility] and set up [place].  Expected Discharge Date:                  Expected Discharge Plan:  [place]  In-House Referral:  Clinical Social Work  Discharge planning Services  CM Consult  Post Acute Care Choice:  Durable Medical Equipment, Home Health Choice offered to:  Patient  DME Arranged:  Walker rolling DME Agency:  [place] Arranged:  PT [place]:  [place]  Status of Service:  Completed, signed off  Medicare Important Message Given:    Date Medicare IM Given:    Medicare IM give by:    Date Additional Medicare IM Given:    Additional Medicare Important Message give by:     If discussed at [place] of Stay Meetings, dates discussed:    Additional Comments:  Nila Nephew, RN 05/21/2015, 9:57 AM

## 2015-05-21 NOTE — Progress Notes (Addendum)
Physical Therapy Treatment Patient Details Name: [name] MRN: [id number] DOB: Aug 12, 1940 Today's Date: 05/21/2015    History of Present Illness Pt is a 74 yo female admitted 12/20 for elective L THA. 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     PT Comments    Patient with increased activity tolerance today with ability to ambulate 100 ft with min guard before c/o fatigue. Min guard/min A for transfers and bed mobility with pt still needing cues for techniques and bed rails for assistance. Pt will continue to benefit from skilled PT services for increased independence and safety with mobility. Updated d/c plan to home health PT with supervision/assistance-24 hour. Pt and son report 24 hour assistance available for pt when returned home.   Follow Up Recommendations  Home health PT-supervision/assistance 24 hour     Equipment Recommendations  Rolling walker with 5" wheels    Recommendations for Other Services Rehab consult     Precautions / Restrictions Precautions Precautions: Fall Precaution Comments: left sided weakness  Restrictions Weight Bearing Restrictions: Yes LLE Weight Bearing: Weight bearing as tolerated    Mobility  Bed Mobility Overal bed mobility: Needs Assistance Bed Mobility: Supine to Sit     Supine to sit: Min assist     General bed mobility comments: min A for rising up to sitting with use of bedrails and HOB elevated; R LE assisted to bring L LE to EOB  Transfers Overall transfer level: Needs assistance Equipment used: Rolling walker (2 wheeled) Transfers: Sit to/from Stand (from [facility], EOB elevated slightly, and recliner) Sit to Stand: Min guard         General transfer comment: vc for hand and foot placement; extra time to achieve upright posture upon standing with heavy use of bilat UE for support  Ambulation/Gait Ambulation/Gait assistance: Min guard Ambulation Distance (Feet): 100 Feet Assistive  device: Rolling walker (2 wheeled) Gait Pattern/deviations: Step-to pattern;Decreased stride length;Decreased stance time - left;Antalgic;Trunk flexed Gait velocity: decreased   General Gait Details: vc for upright posture; heavy use of bilat UE for support; vc for position of RW and sequencing of gait pattern with RW   Stairs            Wheelchair Mobility    Modified Rankin (Stroke Patients Only)       Balance Overall balance assessment: Needs assistance Sitting-balance support: Feet supported Sitting balance-Leahy Scale: Good     Standing balance support: Single extremity supported Standing balance-Leahy Scale: Fair Standing balance comment: able to maintain balance with single UE support at sink                    Cognition Arousal/Alertness: Awake/alert Behavior During Therapy: WFL for tasks assessed/performed Overall Cognitive Status: Impaired/Different from baseline Area of Impairment: Safety/judgement         Safety/Judgement: Decreased awareness of safety;Decreased awareness of deficits     General Comments: questionable accuracy with report of PLOF as pt's report of who would be available to assist her changed t/o session. Pt initially reported she would be alone during the day and then once PT recommend Rehab she reports "1 of my 5 kids would be able to check on me."     Exercises Total Joint Exercises Ankle Circles/Pumps: AROM;10 reps;Both Hip ABduction/ADduction: AROM;Left;10 reps;Seated Long Arc Quad: AROM;Left;10 reps;Seated Knee Flexion: AROM;Left;10 reps;Standing Marching in Standing: AROM;Left;10 reps;Standing    General Comments        Pertinent Vitals/Pain  Pain Assessment: 0-10 Pain Score: 5  Pain Location: L hip Pain Descriptors / Indicators: Sore Pain Intervention(s): Limited activity within patient's tolerance;Monitored during session;Premedicated before session    Home Living                      Prior Function             PT Goals (current goals can now be found in the care plan section) Acute Rehab PT Goals Patient Stated Goal: go home for Christmas PT Goal Formulation: With patient Time For Goal Achievement: 05/26/15 Potential to Achieve Goals: Good Progress towards PT goals: Progressing toward goals    Frequency  7X/week    PT Plan Current plan remains appropriate    Co-evaluation             End of Session Equipment Utilized During Treatment: Gait belt Activity Tolerance: Patient limited by fatigue Patient left: with call bell/phone within reach;in chair;with nursing/sitter in room (on commode with OT)     Time: 2706-2376 PT Time Calculation (min) (ACUTE ONLY): 30 min  Charges:  $Gait Training: 8-22 mins $Therapeutic Exercise: 8-22 mins                    G Codes:      [name], PTA Pager: [phone]   05/21/2015, 9:11 AM

## 2015-05-21 NOTE — Progress Notes (Signed)
Occupational Therapy Treatment Patient Details Name: [name] MRN: [id number] DOB: 10-May-1941 Today's Date: 05/21/2015    History of present illness Pt is a 74 yo female admitted 12/20 for elective L THA. 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    OT comments  Pt making progress toward OT goals. Currently pt is overall min guard for toilet transfers and grooming tasks standing at the sink. She still requires min assist for LB ADLs. Pt reports that family will be available to assist/supervise 24/7 upon return home. Discussed importance of having someone very close by for safety during all functional mobility and ADL activities; pt verbalized understanding. Upgraded pts d/c plan to home with [facility] for follow up therapy to maximize independence and safety with ADLs and functional mobility; CM notified. Will continue to follow pt acutely.   Follow Up Recommendations  Home health OT;Supervision/Assistance - 24 hour    Equipment Recommendations  Tub/shower bench    Recommendations for Other Services      Precautions / Restrictions Precautions Precautions: Fall Precaution Comments: left sided weakness  Restrictions Weight Bearing Restrictions: Yes LLE Weight Bearing: Weight bearing as tolerated       Mobility Bed Mobility Overal bed mobility: Needs Assistance Bed Mobility: Supine to Sit     Supine to sit: Min assist     General bed mobility comments: Pt OOB in chair  Transfers Overall transfer level: Needs assistance Equipment used: Rolling walker (2 wheeled) Transfers: Sit to/from Stand Sit to Stand: Min guard         General transfer comment: Min guard for safety; no physical assist needed. Good hand placement and technique. Increased time needed for transfers. Sit to stand from chair x 1, BSC x 1    Balance Overall balance assessment: Needs assistance Sitting-balance support: Feet supported Sitting balance-Leahy Scale:  Good     Standing balance support: No upper extremity supported Standing balance-Leahy Scale: Fair Standing balance comment: Pt able to stand at sink and wash hands with no UE support                   ADL Overall ADL's : Needs assistance/impaired     Grooming: Min guard;Standing;Wash/dry hands;Oral care Grooming Details (indicate cue type and reason): Pt leaning on sink with forearms to complete oral care in standing.             Lower Body Dressing: Minimal assistance Lower Body Dressing Details (indicate cue type and reason): Pt able to pull up bilateral socks prior to attempting toilet transfer Toilet Transfer: Min guard;Ambulation;BSC;RW Toilet Transfer Details (indicate cue type and reason): Discussed use of 3 in 1 over toilet; pt reports she already has that set up at home. Toileting- Water quality scientist and Hygiene: Min guard;Sit to/from stand Toileting - Clothing Manipulation Details (indicate cue type and reason): for toilet hygiene     Functional mobility during ADLs: Min guard;Rolling walker General ADL Comments: No family present for OT session. Educated on need for 24/7 supervision-pt reports daughter and other family will be staying with her at all times, need for close supervision during all ADLs and mobility upon return home, ice for edema and pain; pt verbalized understanding.      Vision                     Perception     Praxis      Cognition   Behavior During Therapy: [facility] for  tasks assessed/performed Overall Cognitive Status: Within Functional Limits for tasks assessed Area of Impairment: Safety/judgement          Safety/Judgement: Decreased awareness of safety;Decreased awareness of deficits     General Comments: questionable accuracy with report of PLOF as pt's report of who would be available to assist her changed t/o session. Pt initially reported she would be alone during the day and then once PT recommend Rehab she reports  "1 of my 5 kids would be able to check on me."     Extremity/Trunk Assessment               Exercises Total Joint Exercises Ankle Circles/Pumps: AROM;10 reps;Both Hip ABduction/ADduction: AROM;Left;10 reps;Seated Long Arc Quad: AROM;Left;10 reps;Seated Knee Flexion: AROM;Left;10 reps;Standing Marching in Standing: AROM;Left;10 reps;Standing   Shoulder Instructions       General Comments      Pertinent Vitals/ Pain       Pain Assessment: 0-10 Pain Score: 5  Pain Location: L hip Pain Descriptors / Indicators: Sore Pain Intervention(s): Limited activity within patient's tolerance;Monitored during session;Ice applied  Home Living                                          Prior Functioning/Environment              Frequency Min 2X/week     Progress Toward Goals  OT Goals(current goals can now be found in the care plan section)  Progress towards OT goals: Progressing toward goals  Acute Rehab OT Goals Patient Stated Goal: to go home today OT Goal Formulation: With patient  Plan Discharge plan needs to be updated    Co-evaluation                 End of Session Equipment Utilized During Treatment: Gait belt;Rolling walker   Activity Tolerance Patient tolerated treatment well   Patient Left in chair;with call bell/phone within reach   Nurse Communication          Time: 5176-1607 OT Time Calculation (min): 20 min  Charges: OT General Charges $OT Visit: 1 Procedure OT Treatments $Self Care/Home Management : 8-22 mins  Binnie Kand M.S., OTR/L Pager: [phone]  05/21/2015, 11:05 AM

## 2015-05-21 NOTE — Care Management Important Message (Signed)
Important Message  Patient Details  Name: [name] MRN: [id number] Date of Birth: 1940/11/15   Medicare Important Message Given:  Yes    Nathen May 05/21/2015, 12:02 PM

## 2015-06-01 DIAGNOSIS — M1612 Unilateral primary osteoarthritis, left hip: Secondary | ICD-10-CM | POA: Diagnosis not present

## 2015-07-01 ENCOUNTER — Ambulatory Visit: Payer: Medicare Other | Attending: Orthopaedic Surgery

## 2015-07-20 ENCOUNTER — Ambulatory Visit: Payer: Medicare Other | Admitting: Neurology

## 2015-07-21 ENCOUNTER — Encounter: Payer: Self-pay | Admitting: Neurology

## 2015-07-23 DIAGNOSIS — I10 Essential (primary) hypertension: Secondary | ICD-10-CM | POA: Diagnosis not present

## 2015-07-23 DIAGNOSIS — E785 Hyperlipidemia, unspecified: Secondary | ICD-10-CM | POA: Diagnosis not present

## 2015-07-23 DIAGNOSIS — I639 Cerebral infarction, unspecified: Secondary | ICD-10-CM | POA: Diagnosis not present

## 2015-07-23 DIAGNOSIS — I714 Abdominal aortic aneurysm, without rupture: Secondary | ICD-10-CM | POA: Diagnosis not present

## 2015-07-23 DIAGNOSIS — I252 Old myocardial infarction: Secondary | ICD-10-CM | POA: Diagnosis not present

## 2015-07-23 DIAGNOSIS — I2510 Atherosclerotic heart disease of native coronary artery without angina pectoris: Secondary | ICD-10-CM | POA: Diagnosis not present

## 2015-07-23 DIAGNOSIS — I739 Peripheral vascular disease, unspecified: Secondary | ICD-10-CM | POA: Diagnosis not present

## 2015-07-23 DIAGNOSIS — F17290 Nicotine dependence, other tobacco product, uncomplicated: Secondary | ICD-10-CM | POA: Diagnosis not present

## 2015-07-23 DIAGNOSIS — R0609 Other forms of dyspnea: Secondary | ICD-10-CM | POA: Diagnosis not present

## 2015-08-12 DIAGNOSIS — I2510 Atherosclerotic heart disease of native coronary artery without angina pectoris: Secondary | ICD-10-CM | POA: Diagnosis not present

## 2015-08-12 DIAGNOSIS — F17290 Nicotine dependence, other tobacco product, uncomplicated: Secondary | ICD-10-CM | POA: Diagnosis not present

## 2015-08-12 DIAGNOSIS — I639 Cerebral infarction, unspecified: Secondary | ICD-10-CM | POA: Diagnosis not present

## 2015-08-12 DIAGNOSIS — I739 Peripheral vascular disease, unspecified: Secondary | ICD-10-CM | POA: Diagnosis not present

## 2015-08-12 DIAGNOSIS — R0609 Other forms of dyspnea: Secondary | ICD-10-CM | POA: Diagnosis not present

## 2015-08-12 DIAGNOSIS — I10 Essential (primary) hypertension: Secondary | ICD-10-CM | POA: Diagnosis not present

## 2015-08-12 DIAGNOSIS — I252 Old myocardial infarction: Secondary | ICD-10-CM | POA: Diagnosis not present

## 2015-09-02 ENCOUNTER — Ambulatory Visit: Payer: Medicare Other | Admitting: Neurology

## 2015-09-03 ENCOUNTER — Encounter (HOSPITAL_COMMUNITY): Payer: Self-pay | Admitting: *Deleted

## 2015-09-03 ENCOUNTER — Encounter: Payer: Self-pay | Admitting: Neurology

## 2015-09-03 DIAGNOSIS — N3091 Cystitis, unspecified with hematuria: Secondary | ICD-10-CM | POA: Insufficient documentation

## 2015-09-03 DIAGNOSIS — Z8669 Personal history of other diseases of the nervous system and sense organs: Secondary | ICD-10-CM | POA: Diagnosis not present

## 2015-09-03 DIAGNOSIS — Z87442 Personal history of urinary calculi: Secondary | ICD-10-CM | POA: Insufficient documentation

## 2015-09-03 DIAGNOSIS — Z7984 Long term (current) use of oral hypoglycemic drugs: Secondary | ICD-10-CM | POA: Insufficient documentation

## 2015-09-03 DIAGNOSIS — I10 Essential (primary) hypertension: Secondary | ICD-10-CM | POA: Insufficient documentation

## 2015-09-03 DIAGNOSIS — Z79899 Other long term (current) drug therapy: Secondary | ICD-10-CM | POA: Insufficient documentation

## 2015-09-03 DIAGNOSIS — E119 Type 2 diabetes mellitus without complications: Secondary | ICD-10-CM | POA: Insufficient documentation

## 2015-09-03 DIAGNOSIS — Z9889 Other specified postprocedural states: Secondary | ICD-10-CM | POA: Diagnosis not present

## 2015-09-03 DIAGNOSIS — R300 Dysuria: Secondary | ICD-10-CM | POA: Diagnosis present

## 2015-09-03 DIAGNOSIS — Z7982 Long term (current) use of aspirin: Secondary | ICD-10-CM | POA: Insufficient documentation

## 2015-09-03 DIAGNOSIS — K219 Gastro-esophageal reflux disease without esophagitis: Secondary | ICD-10-CM | POA: Insufficient documentation

## 2015-09-03 DIAGNOSIS — Z8701 Personal history of pneumonia (recurrent): Secondary | ICD-10-CM | POA: Diagnosis not present

## 2015-09-03 DIAGNOSIS — F17210 Nicotine dependence, cigarettes, uncomplicated: Secondary | ICD-10-CM | POA: Insufficient documentation

## 2015-09-03 DIAGNOSIS — M1990 Unspecified osteoarthritis, unspecified site: Secondary | ICD-10-CM | POA: Diagnosis not present

## 2015-09-03 DIAGNOSIS — Z880 Allergy status to penicillin: Secondary | ICD-10-CM | POA: Insufficient documentation

## 2015-09-03 DIAGNOSIS — Z8673 Personal history of transient ischemic attack (TIA), and cerebral infarction without residual deficits: Secondary | ICD-10-CM | POA: Insufficient documentation

## 2015-09-03 DIAGNOSIS — R319 Hematuria, unspecified: Secondary | ICD-10-CM | POA: Diagnosis not present

## 2015-09-03 DIAGNOSIS — I252 Old myocardial infarction: Secondary | ICD-10-CM | POA: Diagnosis not present

## 2015-09-03 DIAGNOSIS — N3090 Cystitis, unspecified without hematuria: Secondary | ICD-10-CM | POA: Diagnosis not present

## 2015-09-03 LAB — COMPREHENSIVE METABOLIC PANEL
ALT: 8 U/L — AB (ref 14–54)
ANION GAP: 10 (ref 5–15)
AST: 17 U/L (ref 15–41)
Albumin: 3.2 g/dL — ABNORMAL LOW (ref 3.5–5.0)
Alkaline Phosphatase: 69 U/L (ref 38–126)
BUN: 15 mg/dL (ref 6–20)
CHLORIDE: 106 mmol/L (ref 101–111)
CO2: 20 mmol/L — AB (ref 22–32)
Calcium: 9.3 mg/dL (ref 8.9–10.3)
Creatinine, Ser: 1.18 mg/dL — ABNORMAL HIGH (ref 0.44–1.00)
GFR calc Af Amer: 51 mL/min — ABNORMAL LOW (ref >60)
GFR calc non Af Amer: 44 mL/min — ABNORMAL LOW (ref >60)
Glucose, Bld: 237 mg/dL — ABNORMAL HIGH (ref 65–99)
Potassium: 4.2 mmol/L (ref 3.5–5.1)
SODIUM: 136 mmol/L (ref 135–145)
Total Bilirubin: 0.6 mg/dL (ref 0.3–1.2)
Total Protein: 7 g/dL (ref 6.5–8.1)

## 2015-09-03 LAB — URINALYSIS, ROUTINE W REFLEX MICROSCOPIC
BILIRUBIN URINE: NEGATIVE
GLUCOSE, UA: NEGATIVE mg/dL
Ketones, ur: 15 mg/dL — AB
Nitrite: NEGATIVE
PROTEIN: 100 mg/dL — AB
SPECIFIC GRAVITY, URINE: 1.011 (ref 1.005–1.030)
pH: 5.5 (ref 5.0–8.0)

## 2015-09-03 LAB — URINE MICROSCOPIC-ADD ON

## 2015-09-03 LAB — CBC
HEMATOCRIT: 34.4 % — AB (ref 36.0–46.0)
HEMOGLOBIN: 11.7 g/dL — AB (ref 12.0–15.0)
MCH: 29.2 pg (ref 26.0–34.0)
MCHC: 34 g/dL (ref 30.0–36.0)
MCV: 85.8 fL (ref 78.0–100.0)
PLATELETS: 208 10*3/uL (ref 150–400)
RBC: 4.01 MIL/uL (ref 3.87–5.11)
RDW: 15.2 % (ref 11.5–15.5)
WBC: 11.6 10*3/uL — ABNORMAL HIGH (ref 4.0–10.5)

## 2015-09-03 NOTE — ED Notes (Signed)
Painful urination today and bloody urine  No temp

## 2015-09-04 ENCOUNTER — Emergency Department (HOSPITAL_COMMUNITY)
Admission: EM | Admit: 2015-09-04 | Discharge: 2015-09-04 | Disposition: A | Discharge status: Home / Self Care | Payer: Commercial Managed Care - HMO | Attending: Emergency Medicine | Admitting: Emergency Medicine

## 2015-09-04 DIAGNOSIS — N3090 Cystitis, unspecified without hematuria: Secondary | ICD-10-CM

## 2015-09-04 MED ORDER — CEPHALEXIN 250 MG PO CAPS: 250.0000 mg | ORAL_CAPSULE | Freq: Four times a day (QID) | ORAL | Status: DC

## 2015-09-04 MED ORDER — CEPHALEXIN 250 MG PO CAPS
500.0000 mg | ORAL_CAPSULE | Freq: Once | ORAL | Status: AC
  Administered 2015-09-04: 500 mg via ORAL
  Filled 2015-09-04: qty 2

## 2015-09-04 NOTE — ED Notes (Signed)
Pt left with all her belongings and was wheeled out of the treatment area.  

## 2015-09-04 NOTE — Discharge Instructions (Signed)

## 2015-09-04 NOTE — ED Provider Notes (Signed)
CSN: [id number]     Arrival date & time 09/03/15  2207 History  By signing my name below, I, Rayna Sexton, attest that this documentation has been prepared under the direction and in the presence of Daleen Bo, MD. Electronically Signed: Rayna Sexton, ED Scribe. 09/04/2015. 1:22 AM.   Chief Complaint  Patient presents with  . Dysuria   The history is provided by the patient. No language interpreter was used.    HPI Comments: [name] is a 75 y.o. female who presents to the Emergency Department complaining of constant, moderate, increased urinary frequency onset at 5:00 PM. Pt reports associated hematuria onset this evening. She reports back pain onset 3 weeks ago noting that she applies heat to the region which provides relief of her symptoms. She denies dysuria, constipation or any other associated symptoms at this time.   Past Medical History  Diagnosis Date  . Peripheral vascular disease ([place])   . Hyperlipidemia   . Hypertension   . TIA (transient ischemic attack)   . Arthritis     osteoarthritis  . Aneurysm ([place])   . AAA (abdominal aortic aneurysm) ([place])   . Myocardial infarction ([place])   . Stroke ([place]) 01/2015    with left side weakness   . Pneumonia   . Diabetes mellitus     Type 2, on Glimiperide  . Kidney stones   . GERD (gastroesophageal reflux disease)   . Constipation   . Glaucoma   . Urinary incontinence    Past Surgical History  Procedure Laterality Date  . Uterine tumor    . Ventral hernia repair    . Abdominal aortic aneurysm repair    . Abdominal hysterectomy      partial  . Tubal ligation    . Back surgery    . Abdominal aortic aneurysm repair  ?2012  . Abdominal aortagram N/A 03/13/2014    Procedure: ABDOMINAL AORTAGRAM;  Surgeon: Elam Dutch, MD;  Location[facility] CATH LAB;  Service: Cardiovascular;  Laterality: N/A;  . Left heart catheterization with coronary angiogram N/A 04/12/2014    Procedure: LEFT HEART CATHETERIZATION WITH CORONARY  ANGIOGRAM;  Surgeon: Clent Demark, MD;  Location: [facility] CATH LAB;  Service: Cardiovascular;  Laterality: N/A;  . Percutaneous coronary stent intervention (pci-s)  04/12/2014    Procedure: PERCUTANEOUS CORONARY STENT INTERVENTION (PCI-S);  Surgeon: Clent Demark, MD;  Location: [facility] CATH LAB;  Service: Cardiovascular;;  prox and mid RCA  . Femoral-popliteal bypass graft Left 07/06/2014    Procedure: BYPASS GRAFT FEMORAL-POPLITEAL ARTERY;  Surgeon: Elam Dutch, MD;  Location: [place];  Service: Vascular;  Laterality: Left;  Marland Kitchen Eye surgery Right     laser surgery for blood behind eye, loss of sight  . Cardiac catheterization    . Coronary angioplasty  03/2015  . Total hip arthroplasty Left 05/18/2015  . Joint replacement    . Total knee arthroplasty Left ~ 2003  . Hernia repair    . Total hip arthroplasty Left 05/18/2015    Procedure: LEFT TOTAL HIP ARTHROPLASTY ANTERIOR APPROACH;  Surgeon: Mcarthur Rossetti, MD;  Location: [place];  Service: Orthopedics;  Laterality: Left;   Family History  Problem Relation Age of Onset  . Diabetes Mother   . Stroke Mother   . Hypertension Father   . Stroke Father   . Hyperlipidemia Sister   . Hypertension Sister   . Aneurysm Sister   . Hyperlipidemia Brother   . Hypertension Brother    Social History  Substance Use Topics  . Smoking status: Current Every Day Smoker -- 1.00 packs/day for 40 years    Types: Cigarettes  . Smokeless tobacco: Never Used  . Alcohol Use: No   OB History    No data available     Review of Systems  Gastrointestinal: Negative for constipation.  Genitourinary: Positive for frequency and hematuria. Negative for dysuria.  Musculoskeletal: Positive for back pain.  All other systems reviewed and are negative.  Allergies  Codeine; Lipitor; Azor; Lisinopril; and Penicillins  Home Medications   Prior to Admission medications   Medication Sig Start Date End Date Taking? Authorizing Provider  acetaminophen (TYLENOL)  325 MG tablet Take 2 tablets (650 mg total) by mouth every 6 (six) hours as needed for mild pain or moderate pain (or Fever >/= 101). 05/21/15  Yes Mcarthur Rossetti, MD  amLODipine (NORVASC) 5 MG tablet Take 5 mg by mouth 2 (two) times daily.    Yes Historical Provider, MD  aspirin EC 81 MG tablet Take 81 mg by mouth daily.   Yes Historical Provider, MD  esomeprazole (NEXIUM) 20 MG capsule Take 20 mg by mouth as needed.   Yes Historical Provider, MD  glimepiride (AMARYL) 2 MG tablet Take 2-3 mg by mouth 2 (two) times daily. Take 3 mg by mouth in the morning and take 2 mg by mouth in the afternoon (around 6 pm)   Yes Historical Provider, MD  isosorbide mononitrate (IMDUR) 60 MG 24 hr tablet Take 60 mg by mouth daily. 02/24/15  Yes Historical Provider, MD  methocarbamol (ROBAXIN) 500 MG tablet Take 1 tablet (500 mg total) by mouth every 6 (six) hours as needed for muscle spasms. 05/21/15  Yes Mcarthur Rossetti, MD  metoprolol (TOPROL-XL) 50 MG 24 hr tablet Take 50 mg by mouth daily.    Yes Historical Provider, MD  nitroGLYCERIN (NITROSTAT) 0.4 MG SL tablet Place 0.4 mg under the tongue every 5 (five) minutes as needed for chest pain.   Yes Historical Provider, MD  ticagrelor (BRILINTA) 90 MG TABS tablet Take 1 tablet (90 mg total) by mouth 2 (two) times daily. 04/15/14  Yes [name], MD  vitamin B-12 (CYANOCOBALAMIN) 50 MCG tablet Take 50 mcg by mouth daily.   Yes Historical Provider, MD  Vitamin D, Ergocalciferol, (DRISDOL) 50000 UNITS CAPS capsule Take 50,000 Units by mouth every 7 (seven) days. fridays   Yes Historical Provider, MD  aspirin EC 325 MG EC tablet Take 1 tablet (325 mg total) by mouth 2 (two) times daily after a meal. Patient not taking: Reported on 09/04/2015 05/21/15   Mcarthur Rossetti, MD  cephALEXin (KEFLEX) 250 MG capsule Take 1 capsule (250 mg total) by mouth 4 (four) times daily. 09/04/15   Daleen Bo, MD  pravastatin (PRAVACHOL) 20 MG tablet Take 1 tablet (20  mg total) by mouth daily at 6 PM. Patient not taking: Reported on 09/04/2015 02/19/15   Dixie Dials, MD   BP 154/74 mmHg  Pulse 65  Temp(Src) 98.7 F (37.1 C) (Oral)  Resp 18  SpO2 100% Physical Exam  Constitutional: She is oriented to person, place, and time. She appears well-developed and well-nourished.  HENT:  Head: Normocephalic and atraumatic.  Eyes: Conjunctivae and EOM are normal. Pupils are equal, round, and reactive to light.  Neck: Normal range of motion and phonation normal. Neck supple.  Cardiovascular: Normal rate and regular rhythm.   Pulmonary/Chest: Effort normal and breath sounds normal. She exhibits no tenderness.  Abdominal: Soft. She  exhibits no distension. There is no tenderness. There is no guarding.  Genitourinary:  No CVA tenderness  Musculoskeletal: Normal range of motion.  No musculoskeletal tenderness of the back.   Neurological: She is alert and oriented to person, place, and time. She exhibits normal muscle tone.  Skin: Skin is warm and dry.  Psychiatric: She has a normal mood and affect. Her behavior is normal. Judgment and thought content normal.  Nursing note and vitals reviewed.  ED Course  Procedures  DIAGNOSTIC STUDIES: Oxygen Saturation is 98% on RA, normal by my interpretation.    COORDINATION OF CARE: 1:21 AM Discussed next steps with pt. She verbalized understanding and is agreeable with the plan.   Medications  cephALEXin (KEFLEX) capsule 500 mg (500 mg Oral Given 09/04/15 0148)    Patient Vitals for the past 24 hrs:  BP Temp Temp src Pulse Resp SpO2  09/04/15 0115 154/74 mmHg - - 65 - 100 %  09/03/15 2218 140/65 mmHg 98.7 F (37.1 C) Oral 75 18 98 %    At discharge- Reevaluation with update and discussion. After initial assessment and treatment, an updated evaluation reveals she remains comfortable. No additional complaints. Findings discussed with patient, all questions were answered. [name]    Labs Review Labs Reviewed   COMPREHENSIVE METABOLIC PANEL - Abnormal; Notable for the following:    CO2 20 (*)    Glucose, Bld 237 (*)    Creatinine, Ser 1.18 (*)    Albumin 3.2 (*)    ALT 8 (*)    GFR calc non Af Amer 44 (*)    GFR calc Af Amer 51 (*)    All other components within normal limits  CBC - Abnormal; Notable for the following:    WBC 11.6 (*)    Hemoglobin 11.7 (*)    HCT 34.4 (*)    All other components within normal limits  URINALYSIS, ROUTINE W REFLEX MICROSCOPIC (NOT AT [facility]) - Abnormal; Notable for the following:    Color, Urine RED (*)    APPearance TURBID (*)    Hgb urine dipstick LARGE (*)    Ketones, ur 15 (*)    Protein, ur 100 (*)    Leukocytes, UA LARGE (*)    All other components within normal limits  URINE MICROSCOPIC-ADD ON - Abnormal; Notable for the following:    Squamous Epithelial / LPF 0-5 (*)    Bacteria, UA FEW (*)    All other components within normal limits  URINE CULTURE    Imaging Review No results found. I have personally reviewed and evaluated these images and lab results as part of my medical decision-making.   EKG Interpretation None      MDM   Final diagnoses:  Cystitis    Evaluation is consistent with hemorrhagic cystitis. Doubt systemic infection, metabolic instability or impending vascular collapse  Nursing Notes Reviewed/ Care Coordinated Applicable Imaging Reviewed Interpretation of Laboratory Data incorporated into ED treatment  The patient appears reasonably screened and/or stabilized for discharge and I doubt any other medical condition or other [facility] requiring further screening, evaluation, or treatment in the ED at this time prior to discharge.  Plan: Home Medications- Keflex; Home Treatments- rest, fluids; return here if the recommended treatment, does not improve the symptoms; Recommended follow up- PCP 1 week   I personally performed the services described in this documentation, which was scribed in my presence. The recorded  information has been reviewed and is accurate.      Vira Agar  Eulis Foster, MD 09/04/15 [phone]

## 2015-09-06 LAB — URINE CULTURE

## 2015-09-07 ENCOUNTER — Telehealth: Payer: Self-pay | Admitting: *Deleted

## 2015-09-07 NOTE — ED Notes (Signed)
Post ED Visit - Positive Culture Follow-up  Culture report reviewed by antimicrobial stewardship pharmacist:  '[]'$  Elenor Quinones, Pharm.D. '[]'$  Heide Guile, Pharm.D., BCPS '[]'$  Parks Neptune, Pharm.D. '[]'$  Alycia Rossetti, Pharm.D., BCPS '[]'$  [place], Pharm.D., BCPS, AAHIVP '[x]'$  Legrand Como, Pharm.D., BCPS, AAHIVP '[]'$  Milus Glazier, Pharm.D. '[]'$  Stephens November, Pharm.D.  Positive urine culture Treated with Cephalexin, organism sensitive to the same and no further patient follow-up is required at this time.  Harlon Flor [facility] 09/07/2015, 4:13 PM

## 2015-09-17 ENCOUNTER — Telehealth: Payer: Self-pay

## 2015-09-17 NOTE — Telephone Encounter (Signed)
Phone call from pt.  Reported a "knot" in the left lower extremity incision line, on inner aspect of the knee x 1 week.  Reported the area is "hard, like a boil."  Denied any redness or warmth.  Described the size as "comparible to a dried pinto bean."   Reported the size has increased from last week.  Denied any fever today.  Reported fever last week, and had a bladder infection @ that time.  Appt. given at 2:15 PM on 09/20/15 with nurse practitioner.  Advised she should go to ER this weekend if symptoms worsen with increased size of raised area on incision, increased redness/ warmth at site, or fever/ chills.  Verb. Understanding.

## 2015-09-20 ENCOUNTER — Encounter: Payer: Self-pay | Admitting: Family

## 2015-09-20 ENCOUNTER — Ambulatory Visit (INDEPENDENT_AMBULATORY_CARE_PROVIDER_SITE_OTHER): Payer: Commercial Managed Care - HMO | Admitting: Family

## 2015-09-20 VITALS — BP 135/70 | HR 82 | Temp 98.2°F | Resp 16 | Ht 67.0 in | Wt 175.0 lb

## 2015-09-20 DIAGNOSIS — Z720 Tobacco use: Secondary | ICD-10-CM

## 2015-09-20 DIAGNOSIS — I739 Peripheral vascular disease, unspecified: Secondary | ICD-10-CM

## 2015-09-20 DIAGNOSIS — E1151 Type 2 diabetes mellitus with diabetic peripheral angiopathy without gangrene: Secondary | ICD-10-CM

## 2015-09-20 DIAGNOSIS — T814XXA Infection following a procedure, initial encounter: Secondary | ICD-10-CM

## 2015-09-20 DIAGNOSIS — IMO0001 Reserved for inherently not codable concepts without codable children: Secondary | ICD-10-CM

## 2015-09-20 DIAGNOSIS — Z95828 Presence of other vascular implants and grafts: Secondary | ICD-10-CM | POA: Diagnosis not present

## 2015-09-20 DIAGNOSIS — F17200 Nicotine dependence, unspecified, uncomplicated: Secondary | ICD-10-CM

## 2015-09-20 MED ORDER — CEPHALEXIN 500 MG PO CAPS: 500.0000 mg | ORAL_CAPSULE | Freq: Three times a day (TID) | ORAL | Status: DC

## 2015-09-20 NOTE — Patient Instructions (Addendum)
Peripheral Vascular Disease Peripheral vascular disease (PVD) is a disease of the blood vessels that are not part of your heart and brain. A simple term for PVD is poor circulation. In most cases, PVD narrows the blood vessels that carry blood from your heart to the rest of your body. This can result in a decreased supply of blood to your arms, legs, and internal organs, like your stomach or kidneys. However, it most often affects a person's lower legs and feet. There are two types of PVD.  Organic PVD. This is the more common type. It is caused by damage to the structure of blood vessels.  Functional PVD. This is caused by conditions that make blood vessels contract and tighten (spasm). Without treatment, PVD tends to get worse over time. PVD can also lead to acute ischemic limb. This is when an arm or limb suddenly has trouble getting enough blood. This is a medical emergency. CAUSES Each type of PVD has many different causes. The most common cause of PVD is buildup of a fatty material (plaque) inside of your arteries (atherosclerosis). Small amounts of plaque can break off from the walls of the blood vessels and become lodged in a smaller artery. This blocks blood flow and can cause acute ischemic limb. Other common causes of PVD include:  Blood clots that form inside of blood vessels.  Injuries to blood vessels.  Diseases that cause inflammation of blood vessels or cause blood vessel spasms.  Health behaviors and health history that increase your risk of developing PVD. RISK FACTORS  You may have a greater risk of PVD if you:  Have a family history of PVD.  Have certain medical conditions, including:  High cholesterol.  Diabetes.  High blood pressure (hypertension).  Coronary heart disease.  Past problems with blood clots.  Past injury, such as burns or a broken bone. These may have damaged blood vessels in your limbs.  Buerger disease. This is caused by inflamed blood  vessels in your hands and feet.  Some forms of arthritis.  Rare birth defects that affect the arteries in your legs.  Use tobacco.  Do not get enough exercise.  Are obese.  Are age 50 or older. SIGNS AND SYMPTOMS  PVD may cause many different symptoms. Your symptoms depend on what part of your body is not getting enough blood. Some common signs and symptoms include:  Cramps in your lower legs. This may be a symptom of poor leg circulation (claudication).  Pain and weakness in your legs while you are physically active that goes away when you rest (intermittent claudication).  Leg pain when at rest.  Leg numbness, tingling, or weakness.  Coldness in a leg or foot, especially when compared with the other leg.  Skin or hair changes. These can include:  Hair loss.  Shiny skin.  Pale or bluish skin.  Thick toenails.  Inability to get or maintain an erection (erectile dysfunction). People with PVD are more prone to developing ulcers and sores on their toes, feet, or legs. These may take longer than normal to heal. DIAGNOSIS Your health care provider may diagnose PVD from your signs and symptoms. The health care provider will also do a physical exam. You may have tests to find out what is causing your PVD and determine its severity. Tests may include:  Blood pressure recordings from your arms and legs and measurements of the strength of your pulses (pulse volume recordings).  Imaging studies using sound waves to take pictures of   the blood flow through your blood vessels (Doppler ultrasound).  Injecting a dye into your blood vessels before having imaging studies using:  X-rays (angiogram or arteriogram).  Computer-generated X-rays (CT angiogram).  A powerful electromagnetic field and a computer (magnetic resonance angiogram or MRA). TREATMENT Treatment for PVD depends on the cause of your condition and the severity of your symptoms. It also depends on your age. Underlying  causes need to be treated and controlled. These include long-lasting (chronic) conditions, such as diabetes, high cholesterol, and high blood pressure. You may need to first try making lifestyle changes and taking medicines. Surgery may be needed if these do not work. Lifestyle changes may include:  Quitting smoking.  Exercising regularly.  Following a low-fat, low-cholesterol diet. Medicines may include:  Blood thinners to prevent blood clots.  Medicines to improve blood flow.  Medicines to improve your blood cholesterol levels. Surgical procedures may include:  A procedure that uses an inflated balloon to open a blocked artery and improve blood flow (angioplasty).  A procedure to put in a tube (stent) to keep a blocked artery open (stent implant).  Surgery to reroute blood flow around a blocked artery (peripheral bypass surgery).  Surgery to remove dead tissue from an infected wound on the affected limb.  Amputation. This is surgical removal of the affected limb. This may be necessary in cases of acute ischemic limb that are not improved through medical or surgical treatments. HOME CARE INSTRUCTIONS  Take medicines only as directed by your health care provider.  Do not use any tobacco products, including cigarettes, chewing tobacco, or electronic cigarettes. If you need help quitting, ask your health care provider.  Lose weight if you are overweight, and maintain a healthy weight as directed by your health care provider.  Eat a diet that is low in fat and cholesterol. If you need help, ask your health care provider.  Exercise regularly. Ask your health care provider to suggest some good activities for you.  Use compression stockings or other mechanical devices as directed by your health care provider.  Take good care of your feet.  Wear comfortable shoes that fit well.  Check your feet often for any cuts or sores. SEEK MEDICAL CARE IF:  You have cramps in your legs  while walking.  You have leg pain when you are at rest.  You have coldness in a leg or foot.  Your skin changes.  You have erectile dysfunction.  You have cuts or sores on your feet that are not healing. SEEK IMMEDIATE MEDICAL CARE IF:  Your arm or leg turns cold and blue.  Your arms or legs become red, warm, swollen, painful, or numb.  You have chest pain or trouble breathing.  You suddenly have weakness in your face, arm, or leg.  You become very confused or lose the ability to speak.  You suddenly have a very bad headache or lose your vision.   This information is not intended to replace advice given to you by your health care provider. Make sure you discuss any questions you have with your health care provider.   Document Released: 06/22/2004 Document Revised: 06/05/2014 Document Reviewed: 10/23/2013 [employer] Interactive Patient Education 2016 [employer]     Steps to Quit Smoking  Smoking tobacco can be harmful to your health and can affect almost every organ in your body. Smoking puts you, and those around you, at risk for developing many serious chronic diseases. Quitting smoking is difficult, but it is one of   the best things that you can do for your health. It is never too late to quit. WHAT ARE THE BENEFITS OF QUITTING SMOKING? When you quit smoking, you lower your risk of developing serious diseases and conditions, such as:  Lung cancer or lung disease, such as COPD.  Heart disease.  Stroke.  Heart attack.  Infertility.  Osteoporosis and bone fractures. Additionally, symptoms such as coughing, wheezing, and shortness of breath may get better when you quit. You may also find that you get sick less often because your body is stronger at fighting off colds and infections. If you are pregnant, quitting smoking can help to reduce your chances of having a baby of low birth weight. HOW DO I GET READY TO QUIT? When you decide to quit smoking, create a plan to  make sure that you are successful. Before you quit:  Pick a date to quit. Set a date within the next two weeks to give you time to prepare.  Write down the reasons why you are quitting. Keep this list in places where you will see it often, such as on your bathroom mirror or in your car or wallet.  Identify the people, places, things, and activities that make you want to smoke (triggers) and avoid them. Make sure to take these actions:  Throw away all cigarettes at home, at work, and in your car.  Throw away smoking accessories, such as ashtrays and lighters.  Clean your car and make sure to empty the ashtray.  Clean your home, including curtains and carpets.  Tell your family, friends, and coworkers that you are quitting. Support from your loved ones can make quitting easier.  Talk with your health care provider about your options for quitting smoking.  Find out what treatment options are covered by your health insurance. WHAT STRATEGIES CAN I USE TO QUIT SMOKING?  Talk with your healthcare provider about different strategies to quit smoking. Some strategies include:  Quitting smoking altogether instead of gradually lessening how much you smoke over a period of time. Research shows that quitting "cold turkey" is more successful than gradually quitting.  Attending in-person counseling to help you build problem-solving skills. You are more likely to have success in quitting if you attend several counseling sessions. Even short sessions of 10 minutes can be effective.  Finding resources and support systems that can help you to quit smoking and remain smoke-free after you quit. These resources are most helpful when you use them often. They can include:  Online chats with a counselor.  Telephone quitlines.  Printed self-help materials.  Support groups or group counseling.  Text messaging programs.  Mobile phone applications.  Taking medicines to help you quit smoking. (If you are  pregnant or breastfeeding, talk with your health care provider first.) Some medicines contain nicotine and some do not. Both types of medicines help with cravings, but the medicines that include nicotine help to relieve withdrawal symptoms. Your health care provider may recommend:  Nicotine patches, gum, or lozenges.  Nicotine inhalers or sprays.  Non-nicotine medicine that is taken by mouth. Talk with your health care provider about combining strategies, such as taking medicines while you are also receiving in-person counseling. Using these two strategies together makes you more likely to succeed in quitting than if you used either strategy on its own. If you are pregnant or breastfeeding, talk with your health care provider about finding counseling or other support strategies to quit smoking. Do not take medicine to help you   quit smoking unless told to do so by your health care provider. WHAT THINGS CAN I DO TO MAKE IT EASIER TO QUIT? Quitting smoking might feel overwhelming at first, but there is a lot that you can do to make it easier. Take these important actions:  Reach out to your family and friends and ask that they support and encourage you during this time. Call telephone quitlines, reach out to support groups, or work with a counselor for support.  Ask people who smoke to avoid smoking around you.  Avoid places that trigger you to smoke, such as bars, parties, or smoke-break areas at work.  Spend time around people who do not smoke.  Lessen stress in your life, because stress can be a smoking trigger for some people. To lessen stress, try:  Exercising regularly.  Deep-breathing exercises.  Yoga.  Meditating.  Performing a body scan. This involves closing your eyes, scanning your body from head to toe, and noticing which parts of your body are particularly tense. Purposefully relax the muscles in those areas.  Download or purchase mobile phone or tablet apps (applications)  that can help you stick to your quit plan by providing reminders, tips, and encouragement. There are many free apps, such as QuitGuide from the CDC (Centers for Disease Control and Prevention). You can find other support for quitting smoking (smoking cessation) through smokefree.gov and other websites. HOW WILL I FEEL WHEN I QUIT SMOKING? Within the first 24 hours of quitting smoking, you may start to feel some withdrawal symptoms. These symptoms are usually most noticeable 2-3 days after quitting, but they usually do not last beyond 2-3 weeks. Changes or symptoms that you might experience include:  Mood swings.  Restlessness, anxiety, or irritation.  Difficulty concentrating.  Dizziness.  Strong cravings for sugary foods in addition to nicotine.  Mild weight gain.  Constipation.  Nausea.  Coughing or a sore throat.  Changes in how your medicines work in your body.  A depressed mood.  Difficulty sleeping (insomnia). After the first 2-3 weeks of quitting, you may start to notice more positive results, such as:  Improved sense of smell and taste.  Decreased coughing and sore throat.  Slower heart rate.  Lower blood pressure.  Clearer skin.  The ability to breathe more easily.  Fewer sick days. Quitting smoking is very challenging for most people. Do not get discouraged if you are not successful the first time. Some people need to make many attempts to quit before they achieve long-term success. Do your best to stick to your quit plan, and talk with your health care provider if you have any questions or concerns.   This information is not intended to replace advice given to you by your health care provider. Make sure you discuss any questions you have with your health care provider.   Document Released: 05/09/2001 Document Revised: 09/29/2014 Document Reviewed: 09/29/2014 [employer] Interactive Patient Education 2016 [employer]  

## 2015-09-20 NOTE — Progress Notes (Signed)
[facility] [place]   CC: New "knot" on left leg bypass graft incision, PAD  History of Present Illness [name] is a 75 y.o. female patient of Dr. Oneida Alar who is s/p left femoral to below-knee popliteal bypass with reversed greater saphenous vein on 07/06/2014. This was done for a nonhealing ulcer on the left first toe. She also had claudication in the left leg. She states her claudication symptoms have completely resolved.  Her left first toe is completely healed.  She had one vessel runoff at the time of completion arteriogram.  She returns today with c/o "knot" in the left lower extremity incision line, on inner aspect of the knee x 1 week. She describes the area is "hard, like a boil." Denies any redness or warmth. Described the size as "comparible to a dried pinto bean." Reported the size has increased from last week. Denies any fever tor chills. Reported fever last week, and had a bladder infection @ that time, seen in the ED on 09/04/15 and treated with Keflex for cystitis, states she finished the complete course of Keflex.  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 Her vision has improved. She still has weakness of her left arm and leg. Her carotid duplex at that time showed no significant stenosis. She had a left hip replacement on 05/18/15 by Dr. Rush Farmer, she states significant pain relief in her left hip since this.  Her last carotid duplex exam was in 2016 and showed less than 40% stenosis bilaterally. She also had an exam several years prior to this. Dr. Oneida Alar indicates in his assessment on 04/14/15 that he does not believe she needs further carotid scans unless she develops any symptoms. Of note she has also had repair of an abdominal aortic aneurysm by Dr. Amedeo Plenty years ago. She has had no problems from this. 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 However, she does not really want any intervention for this.  She was treated in the ED on 09/04/15 for a UTI, given Keflex.  Pt Diabetic: Yes, review of records: A1C December 2016 was 7.6 Pt smoker: smoker  (1/2 ppd x 40 yrs)  Pt meds include: Statin :Yes Betablocker: Yes ASA: Yes Other anticoagulants/antiplatelets: Brilinta  Past Medical History  Diagnosis Date  . Peripheral vascular disease ([place])   . Hyperlipidemia   . Hypertension   . TIA (transient ischemic attack)   . Arthritis     osteoarthritis  . Aneurysm ([place])   . AAA (abdominal aortic aneurysm) ([place])   . Myocardial infarction ([place])   . Stroke ([place]) 01/2015    with left side weakness   . Pneumonia   . Diabetes mellitus     Type 2, on Glimiperide  . Kidney stones   . GERD (gastroesophageal reflux disease)   . Constipation   . Glaucoma   . Urinary incontinence     Social History Social History  Substance Use Topics  . Smoking status: Current Every Day Smoker -- 0.50 packs/day for 40 years    Types: Cigarettes  . Smokeless tobacco: Never Used  . Alcohol Use: No    Family History Family History  Problem Relation Age of Onset  . Diabetes Mother   . Stroke Mother   . Hypertension Father   . Stroke Father   . Hyperlipidemia  Sister   . Hypertension Sister   . Aneurysm Sister   . Hyperlipidemia Brother   . Hypertension Brother     Past Surgical History  Procedure Laterality Date  . Uterine tumor    . Ventral hernia repair    . Abdominal aortic aneurysm repair    . Abdominal hysterectomy      partial  . Tubal ligation    . Back surgery    . Abdominal aortic aneurysm repair  ?2012  . Abdominal aortagram N/A 03/13/2014    Procedure: ABDOMINAL AORTAGRAM;  Surgeon: Elam Dutch, MD;  Location: [facility] CATH LAB;  Service: Cardiovascular;  Laterality: N/A;  . Left heart catheterization with coronary angiogram N/A 04/12/2014     Procedure: LEFT HEART CATHETERIZATION WITH CORONARY ANGIOGRAM;  Surgeon: Clent Demark, MD;  Location: [facility] CATH LAB;  Service: Cardiovascular;  Laterality: N/A;  . Percutaneous coronary stent intervention (pci-s)  04/12/2014    Procedure: PERCUTANEOUS CORONARY STENT INTERVENTION (PCI-S);  Surgeon: Clent Demark, MD;  Location[facility] CATH LAB;  Service: Cardiovascular;;  prox and mid RCA  . Femoral-popliteal bypass graft Left 07/06/2014    Procedure: BYPASS GRAFT FEMORAL-POPLITEAL ARTERY;  Surgeon: Elam Dutch, MD;  Location: [facility];  Service: Vascular;  Laterality: Left;  Marland Kitchen Eye surgery Right     laser surgery for blood behind eye, loss of sight  . Cardiac catheterization    . Coronary angioplasty  03/2015  . Total hip arthroplasty Left 05/18/2015  . Joint replacement    . Total knee arthroplasty Left ~ 2003  . Hernia repair    . Total hip arthroplasty Left 05/18/2015    Procedure: LEFT TOTAL HIP ARTHROPLASTY ANTERIOR APPROACH;  Surgeon: Mcarthur Rossetti, MD;  Location: [place];  Service: Orthopedics;  Laterality: Left;    Allergies  Allergen Reactions  . Codeine Other (See Comments)    Knocked her out of equilibrium. Made her world flip upside down  . Lipitor [Atorvastatin] Other (See Comments)    Severe pain/cramping in legs  . Azor [Amlodipine-Olmesartan] Swelling, Palpitations and Rash    Amlodipine alone resumed 12/2013, tolerating  . Lisinopril Cough  . Penicillins Other (See Comments)    Makes exzcema worse    Current Outpatient Prescriptions  Medication Sig Dispense Refill  . acetaminophen (TYLENOL) 325 MG tablet Take 2 tablets (650 mg total) by mouth every 6 (six) hours as needed for mild pain or moderate pain (or Fever >/= 101). 60 tablet 0  . amLODipine (NORVASC) 5 MG tablet Take 5 mg by mouth 2 (two) times daily.     Marland Kitchen aspirin EC 325 MG EC tablet Take 1 tablet (325 mg total) by mouth 2 (two) times daily after a meal. 60 tablet 0  . aspirin EC 81 MG tablet Take 81  mg by mouth daily.    . cephALEXin (KEFLEX) 250 MG capsule Take 1 capsule (250 mg total) by mouth 4 (four) times daily. 28 capsule 0  . esomeprazole (NEXIUM) 20 MG capsule Take 20 mg by mouth as needed.    Marland Kitchen glimepiride (AMARYL) 2 MG tablet Take 2-3 mg by mouth 2 (two) times daily. Take 3 mg by mouth in the morning and take 2 mg by mouth in the afternoon (around 6 pm)    . isosorbide mononitrate (IMDUR) 60 MG 24 hr tablet Take 60 mg by mouth daily.  3  . methocarbamol (ROBAXIN) 500 MG tablet Take 1 tablet (500 mg total) by mouth every 6 (six) hours  as needed for muscle spasms. 60 tablet 0  . metoprolol (TOPROL-XL) 50 MG 24 hr tablet Take 50 mg by mouth daily.     . nitroGLYCERIN (NITROSTAT) 0.4 MG SL tablet Place 0.4 mg under the tongue every 5 (five) minutes as needed for chest pain.    . pravastatin (PRAVACHOL) 20 MG tablet Take 1 tablet (20 mg total) by mouth daily at 6 PM. 30 tablet 1  . ticagrelor (BRILINTA) 90 MG TABS tablet Take 1 tablet (90 mg total) by mouth 2 (two) times daily. 60 tablet 11  . vitamin B-12 (CYANOCOBALAMIN) 50 MCG tablet Take 50 mcg by mouth daily.    . Vitamin D, Ergocalciferol, (DRISDOL) 50000 UNITS CAPS capsule Take 50,000 Units by mouth every 7 (seven) days. fridays     No current facility-administered medications for this visit.    ROS: See HPI for pertinent positives and negatives.   Physical Examination  Filed Vitals:   09/20/15 1342  BP: 135/70  Pulse: 82  Temp: 98.2 F (36.8 C)  TempSrc: Oral  Resp: 16  Height: '5\' 7"'$  (7.510 m)  Weight: 175 lb (79.379 kg)  SpO2: 99%   Body mass index is 27.4 kg/(m^2).  General: A&O x 3, WDWN. Gait: slight limp, using a cane Eyes: PERRLA. Pulmonary: Respirations are non labored Cardiac: regular rhythm, no detected murmur.     Aorta is not palpable. Radial pulses: palpable                           VASCULAR EXAM: Extremities  ischemic changes, without Gangrene; without open wounds. Trace non pitting edema  in left foot/ankle. 5 mm x 6 mm raised round mass at left lower extremity incision line, medial aspect of the knee. No erythema.                                                                                                          LE Pulses Right Left       FEMORAL   palpable   palpable        POPLITEAL  not palpable    palpable       POSTERIOR TIBIAL  not palpable   not palpable, + Doppler signal        DORSALIS PEDIS      ANTERIOR TIBIAL not palpable  not palpable, no Doppler signal        PERONEAL not Palpable   not Palpable, no Doppler signal    Abdomen: soft, NT, no palpable masses. Skin: no rashes, see Extremities. Musculoskeletal: no muscle wasting or atrophy.  Neurologic: A&O X 3; Appropriate Affect ; SENSATION: normal; MOTOR FUNCTION:  moving all extremities equally, motor strength 5/5 throughout except 4/5 in left LE. Speech is fluent/normal. CN 2-12 intact.    Non-Invasive Vascular Imaging: DATE: 09/20/2015  None   ASSESSMENT: [name] is a 75 y.o. female who is s/p left femoral to below-knee popliteal bypass with reversed greater saphenous vein on 07/06/2014. This was done for a nonhealing ulcer on  the left first toe. She also had claudication in the left leg. She states her claudication symptoms have completely resolved.  Her left first toe is completely healed.  She had one vessel runoff at the time of completion arteriogram.  She returns today with a suture abscess at the medial aspect of her left leg bypass graft incision, at the knee level. I expressed the suture abcsess of small amount purulent material, no erythema, no suture felt or visible, small wound cleansed and dressing applied. I discussed with Dr. Trula Slade that pt was recently on Keflex for cystitis. Will reorder Keflex 500 mg tid for 5 days, disp #15, 0 refills.  Pt's atherosclerotic risk factors include active smoking, almost controlled DM, CAD with a hx of MI and cardiac stent placement,  and stroke in 2016.  She has trace non pitting edema in left ankle/foot, see Plan.  PLAN:  The patient was counseled re smoking cessation and given several free resources re smoking cessation.  Graduated walking program, discussed with pt how to perform. I instructed pt in adequate elevation of her legs when not walking.   Based on the patient's vascular studies and examination, pt will return to clinic in November 2017 as already scheduled with Dr. Oneida Alar, graft duplex, and ABIs. I advised pt to notify us if the expressed suture abscess site does not heal and if she develops concerns re the circulation in her legs.   I discussed in depth with the patient the nature of atherosclerosis, and emphasized the importance of maximal medical management including strict control of blood pressure, blood glucose, and lipid levels, obtaining regular exercise, and cessation of smoking.  The patient is aware that without maximal medical management the underlying atherosclerotic disease process will progress, limiting the benefit of any interventions.  The patient was given information about PAD including signs, symptoms, treatment, what symptoms should prompt the patient to seek immediate medical care, and risk reduction measures to take.  Clemon Chambers, RN, MSN, FNP-C [facility] Phone: [phone]  Clinic MD: Trula Slade  09/20/2015 2:04 PM

## 2015-09-28 ENCOUNTER — Ambulatory Visit (INDEPENDENT_AMBULATORY_CARE_PROVIDER_SITE_OTHER): Payer: Commercial Managed Care - HMO | Admitting: Neurology

## 2015-09-28 ENCOUNTER — Encounter: Payer: Self-pay | Admitting: Neurology

## 2015-09-28 VITALS — BP 161/75 | HR 72 | Wt 174.0 lb

## 2015-09-28 DIAGNOSIS — I10 Essential (primary) hypertension: Secondary | ICD-10-CM

## 2015-09-28 DIAGNOSIS — I63311 Cerebral infarction due to thrombosis of right middle cerebral artery: Secondary | ICD-10-CM | POA: Diagnosis not present

## 2015-09-28 DIAGNOSIS — I25119 Atherosclerotic heart disease of native coronary artery with unspecified angina pectoris: Secondary | ICD-10-CM | POA: Diagnosis not present

## 2015-09-28 DIAGNOSIS — E1159 Type 2 diabetes mellitus with other circulatory complications: Secondary | ICD-10-CM

## 2015-09-28 DIAGNOSIS — E785 Hyperlipidemia, unspecified: Secondary | ICD-10-CM

## 2015-09-28 DIAGNOSIS — Z8673 Personal history of transient ischemic attack (TIA), and cerebral infarction without residual deficits: Secondary | ICD-10-CM | POA: Diagnosis not present

## 2015-09-28 NOTE — Patient Instructions (Addendum)
-   continue ASA and brilinta and pravastatin for stroke and cardiac prevention - check BP and glucose at home - quit smoking - Follow up with your primary care physician for stroke risk factor modification. Recommend maintain blood pressure goal <130/80, diabetes with hemoglobin A1c goal below 6.5% and lipids with LDL cholesterol goal below 70 mg/dL.  - follow up with Dr. Terrence Dupont, Dr. Oneida Alar, Dr. Ninfa Linden for cardiology, vascular surgery and orthopedics - will order outpt PT/OT  - follow up in 6 months.

## 2015-09-28 NOTE — Progress Notes (Signed)
STROKE NEUROLOGY FOLLOW UP NOTE  NAME: [name] DOB: [birth year]  REASON FOR VISIT: stroke follow up HISTORY FROM: pt and chart  Today we had the pleasure of seeing [name] in follow-up at our Neurology Clinic. Pt was accompanied by no one.   History Summary [name] is a 75 y.o. 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 MRI showed right CR small infarct secondary to small vessel disease. MRA showed no major vessel occlusion and CUS and TTE unremarkable. However, LDL 137 and A1C 8.4. She was already on ASA 81 and brilinta '90mg'$  bid for cardiac stent which was put in in 03/2014. She was also put on pravastatin due to lipitor intolerance. She was discharged in good condition with [name].   04/16/15 follow up - the patient has been doing better. However, she is complaining of severe left hip pain for which she has seen Dr. Ninfa Linden and plan for left hip surgery once get clearance from medical services. She also had retina procedure 2 weeks ago at right eye. Glucose controlled well at home, 110-120 on reading. Has not quit smoking yet. Has PT/OT/speech 1-2 times per week. She still following with cardiology Dr. Terrence Dupont for CAD and VVS with Dr. Oneida Alar for PAD. BP today 139/65.  Interval History During the interval time, pt had left hip surgery in 04/2015 and recovered well and she is walking with cane now. Had home PT/OT and now no pain. She also followed with [facility] for PAD and next schedule is 03/2016. She also recently treated with keflex of UTI, but not able to tolerate keflex. Followed up with Dr. Lyla Son and no change of medication. BP 161/75. Glucose fluctuate at home. Not quit smoking yet, 1/2PPD today.   REVIEW  OF SYSTEMS: Full 14 system review of systems performed and notable only for those listed below and in HPI above, all others are negative:  Constitutional:   Cardiovascular:  Ear/Nose/Throat:   Skin: itching Eyes:   Loss of vision Respiratory:   Gastroitestinal:  constipation Genitourinary: urination problems Hematology/Lymphatic:  Easy bruise Endocrine:  Musculoskeletal:  Joint pain, joint swelling, aching muscles Allergy/Immunology:  Running nose, skin sensitivity Neurological:  Numbness, weakness, slurry speech, difficulty swallowing Psychiatric: too much sleep, decreased energy, change in appetite Sleep: sleepiness, restless legs  The following represents the patient's updated allergies and side effects list: Allergies  Allergen Reactions  . Keflex [Cephalexin] Shortness Of Breath and Other (See Comments)    Chest pain, headache  . Codeine Other (See Comments)    Knocked her out of equilibrium. Made her world flip upside down  . Lipitor [Atorvastatin] Other (See Comments)    Severe pain/cramping in legs  . Azor [Amlodipine-Olmesartan] Swelling, Palpitations and Rash    Amlodipine alone resumed 12/2013, tolerating  . Lisinopril Cough  . Penicillins Other (See Comments)    Makes exzcema worse    The neurologically relevant items on the patient's problem list were reviewed on today's visit.  Neurologic Examination  A problem focused neurological exam (12 or more points of the single system neurologic examination, vital signs counts as 1 point, cranial nerves count for 8 points) was performed.  Blood pressure 161/75, pulse 72, weight 174 lb (78.926 kg).  General - Well nourished, well developed, in no  apparent distress.  Ophthalmologic - Fundi not visualized due to eye movement.  Cardiovascular - Regular rate and rhythm.  Mental Status -  Level of arousal and orientation to time, place, and person were intact. Language including expression, naming, repetition,  comprehension was assessed and found intact. Fund of Knowledge was assessed and was intact.  Cranial Nerves II - XII - II - Visual field intact OU. III, IV, VI - Extraocular movements intact. V - Facial sensation intact bilaterally. VII - Facial symmetric. VIII - Hearing & vestibular intact bilaterally. X - Palate elevates symmetrically. XI - Chin turning & shoulder shrug intact bilaterally. XII - Tongue protrusion intact.  Motor Strength - The patient's strength was normal in all extremities except left LE proximal 4/5 and pronator drift was absent.  Bulk was normal and fasciculations were absent.   Motor Tone - Muscle tone was assessed at the neck and appendages and was normal.  Reflexes - The patient's reflexes were 1+ in all extremities and she had no pathological reflexes.  Sensory - Light touch, temperature/pinprick, vibration and proprioception, and Romberg testing were assessed and were normal.    Coordination - The patient had normal movements in the hands and feet with no ataxia or dysmetria.  Tremor was absent.  Gait and Station - walk with cane, steady, subtle left antelgic gait.   Data reviewed: I personally reviewed the images and agree with the radiology interpretations.  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 No acute cardiopulmonary disease.   Mr Jodene Nam Head/brain Wo Cm 02/18/2015  1. Acute right corona radiata infarct.  2. Mild-to-moderate chronic small vessel ischemic disease in the cerebral white matter.  3. Chronic bilateral basal ganglia lacunar infarcts.  4. No evidence of major intracranial arterial occlusion or flow-limiting proximal stenosis.  5. Mild bilateral ICA stenosis.  6. Moderate bilateral vertebral artery stenosis. Moderate right P2 PCA stenosis.   2D echo - - Left ventricle: The cavity size was normal. Systolic function was mildly reduced. The estimated  ejection fraction was in the range of 45% to 50%. There is mild hypokinesis of the anteroseptal myocardium. Doppler parameters are consistent with abnormal left ventricular relaxation (grade 1 diastolic dysfunction). - Left atrium: The atrium was mildly dilated.  CUS - Bilateral: 1-39% ICA stenosis. Vertebral artery flow is antegrade.  Component     Latest Ref Rng 02/19/2015  Cholesterol     0 - 200 mg/dL 214 (H)  Triglycerides     <150 mg/dL 219 (H)  HDL Cholesterol     >40 mg/dL 33 (L)  Total CHOL/HDL Ratio      6.5  VLDL     0 - 40 mg/dL 44 (H)  LDL (calc)     0 - 99 mg/dL 137 (H)  Hemoglobin A1C     4.8 - 5.6 % 8.4 (H)  Mean Plasma Glucose      194    Assessment: As you may recall, she is a 75 y.o. 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 MRA showed no major vessel occlusion and CUS and TTE unremarkable. However, LDL 137 and A1C 8.4. She was already on ASA 81 and brilinta '90mg'$  bid for cardiac stent  which was put in in 03/2014. She was also put on pravastatin due to lipitor intolerance. She was discharged in good condition with HH. During the interval time, the patient has severe left hip pain for which she had left hip surgery in 04/2015 and he recovered well. Had home PT/OT and needs outpt PT/OT for balance.     Plan:  - continue ASA and brilinta and pravastatin for stroke and cardiac prevention - check BP and glucose at home - quit smoking - Follow up with your primary care physician for stroke risk factor modification. Recommend maintain blood pressure goal <130/80, diabetes with hemoglobin A1c goal below 6.5% and lipids with LDL cholesterol goal below 70 mg/dL.  - follow up with Dr. Terrence Dupont,  Dr. Oneida Alar, Dr. Ninfa Linden for cardiology, vascular surgery and orthopedics - will order outpt PT/OT  - follow up in 6 months.  I spent more than 25 minutes of face to face time with the patient. Greater than 50% of time was spent in counseling and coordination of care. We have discussed about stroke prevention and further rehab, quit smoking and compliant with medication.   Orders Placed This Encounter  Procedures  . Ambulatory referral to Physical Therapy    Referral Priority:  Routine    Referral Type:  Physical Medicine    Referral Reason:  Specialty Services Required    Requested Specialty:  Physical Therapy    Number of Visits Requested:  1  . Ambulatory referral to Occupational Therapy    Referral Priority:  Routine    Referral Type:  Occupational Therapy    Referral Reason:  Specialty Services Required    Requested Specialty:  Occupational Therapy    Number of Visits Requested:  1    No orders of the defined types were placed in this encounter.    Patient Instructions  - continue ASA and brilinta and pravastatin for stroke and cardiac prevention - check BP and glucose at home - quit smoking - Follow up with your primary care physician for stroke risk factor modification. Recommend maintain blood pressure goal <130/80, diabetes with hemoglobin A1c goal below 6.5% and lipids with LDL cholesterol goal below 70 mg/dL.  - follow up with Dr. Terrence Dupont, Dr. Oneida Alar, Dr. Ninfa Linden for cardiology, vascular surgery and orthopedics - will order outpt PT/OT  - follow up in 6 months.    Rosalin Hawking, MD PhD [facility] [address], [place], [place] [address] [phone]

## 2015-09-30 DIAGNOSIS — H5703 Miosis: Secondary | ICD-10-CM | POA: Diagnosis not present

## 2015-09-30 DIAGNOSIS — H25813 Combined forms of age-related cataract, bilateral: Secondary | ICD-10-CM | POA: Diagnosis not present

## 2015-09-30 DIAGNOSIS — H04123 Dry eye syndrome of bilateral lacrimal glands: Secondary | ICD-10-CM | POA: Diagnosis not present

## 2015-10-11 DIAGNOSIS — H2511 Age-related nuclear cataract, right eye: Secondary | ICD-10-CM | POA: Diagnosis not present

## 2015-10-11 DIAGNOSIS — H25811 Combined forms of age-related cataract, right eye: Secondary | ICD-10-CM | POA: Diagnosis not present

## 2015-10-11 DIAGNOSIS — H21561 Pupillary abnormality, right eye: Secondary | ICD-10-CM | POA: Diagnosis not present

## 2015-10-11 DIAGNOSIS — H5703 Miosis: Secondary | ICD-10-CM | POA: Diagnosis not present

## 2015-12-14 ENCOUNTER — Ambulatory Visit: Payer: Commercial Managed Care - HMO | Attending: Neurology

## 2015-12-14 ENCOUNTER — Ambulatory Visit: Payer: Commercial Managed Care - HMO | Admitting: Occupational Therapy

## 2015-12-14 ENCOUNTER — Encounter: Payer: Self-pay | Admitting: Occupational Therapy

## 2015-12-14 DIAGNOSIS — I69354 Hemiplegia and hemiparesis following cerebral infarction affecting left non-dominant side: Secondary | ICD-10-CM | POA: Diagnosis not present

## 2015-12-14 DIAGNOSIS — R2689 Other abnormalities of gait and mobility: Secondary | ICD-10-CM

## 2015-12-14 DIAGNOSIS — M6281 Muscle weakness (generalized): Secondary | ICD-10-CM | POA: Diagnosis not present

## 2015-12-14 DIAGNOSIS — R278 Other lack of coordination: Secondary | ICD-10-CM | POA: Insufficient documentation

## 2015-12-14 NOTE — Therapy (Signed)
[facility] [address] [place], [place], [address] Phone: [phone]   Fax:  [phone]  Occupational Therapy Evaluation  Patient Details  Name: [name] MRN: [id number] Date of Birth: 1940-08-29 Referring Provider: Dr. Rosalin Hawking  Encounter Date: 12/14/2015      OT End of Session - 12/14/15 1303    Visit Number 1   Number of Visits 9   Date for OT Re-Evaluation 01/14/16   Authorization Type HUMA[employer] MCR - G code needed   Authorization Time Period no auth or visit limit   Authorization - Visit Number 1100   Authorization - Number of Visits [id number]   OT Start Time 1105   OT Stop Time 1145   OT Time Calculation (min) 40 min   Activity Tolerance Patient tolerated treatment well      Past Medical History  Diagnosis Date  . Peripheral vascular disease ([place])   . Hyperlipidemia   . Hypertension   . TIA (transient ischemic attack)   . Arthritis     osteoarthritis  . Aneurysm ([place])   . AAA (abdominal aortic aneurysm) ([place])   . Myocardial infarction ([place])   . Stroke ([place]) 01/2015    with left side weakness   . Pneumonia   . Diabetes mellitus     Type 2, on Glimiperide  . Kidney stones   . GERD (gastroesophageal reflux disease)   . Constipation   . Glaucoma   . Urinary incontinence     Past Surgical History  Procedure Laterality Date  . Uterine tumor    . Ventral hernia repair    . Abdominal aortic aneurysm repair    . Abdominal hysterectomy      partial  . Tubal ligation    . Back surgery    . Abdominal aortic aneurysm repair  ?2012  . Abdominal aortagram N/A 03/13/2014    Procedure: ABDOMINAL AORTAGRAM;  Surgeon: Elam Dutch, MD;  Location[facility] C[facility] LAB;  Service: Cardiovascular;  Laterality: N/A;  . Left heart catheterization with coronary angiogram N/A 04/12/2014    Procedure: LEFT HEART CATHETERIZATION WITH CORONARY ANGIOGRAM;  Surgeon: Clent Demark, MD;  Location: [facility] CATH LAB;  Service:  Cardiovascular;  Laterality: N/A;  . Percutaneous coronary stent intervention (pci-s)  04/12/2014    Procedure: PERCUTANEOUS CORONARY STENT INTERVENTION (PCI-S);  Surgeon: Clent Demark, MD;  Location: [facility] CATH LAB;  Service: Cardiovascular;;  prox and mid RCA  . Femoral-popliteal bypass graft Left 07/06/2014    Procedure: BYPASS GRAFT FEMORAL-POPLITEAL ARTERY;  Surgeon: Elam Dutch, MD;  Location: [facility];  Service: Vascular;  Laterality: Left;  Marland Kitchen Eye surgery Right     laser surgery for blood behind eye, loss of sight  . Cardiac catheterization    . Coronary angioplasty  03/2015  . Total hip arthroplasty Left 05/18/2015  . Joint replacement    . Total knee arthroplasty Left ~ 2003  . Hernia repair    . Total hip arthroplasty Left 05/18/2015    Procedure: LEFT TOTAL HIP ARTHROPLASTY ANTERIOR APPROACH;  Surgeon: Mcarthur Rossetti, MD;  Location: [facility];  Service: Orthopedics;  Laterality: Left;    There were no vitals filed for this visit.      Subjective Assessment - 12/14/15 1109    Subjective  I don't really want to return to cooking   Patient is accompained by: Family member  daughter   Pertinent History CVA 01/2015, Lt THR 05/18/15, MI 2016, Rt eye surgery 2017,  DM, HTN   Patient Stated Goals To get my Lt arm stronger so I can style my hair   Currently in Pain? Yes  O.T. not addressing - outside scope of practice and chronic   Pain Score 5    Pain Location Leg   Pain Orientation Left           [facility] OT Assessment - 12/14/15 0001    Assessment   Diagnosis CVA with Lt non dominant hemiparesis   Referring Provider Dr. Rosalin Hawking   Onset Date 02/18/15   Prior Therapy HH Therapies   Precautions   Precautions Fall   Restrictions   Weight Bearing Restrictions No   Balance Screen   Has the patient fallen in the past 6 months No   Has the patient had a decrease in activity level because of a fear of falling?  Yes   Is the patient reluctant to leave their home because  of a fear of falling?  No   Home  Environment   Bathroom Shower/Tub Tub/Shower unit;Curtain   Home Equipment Grab bars - tub/shower;Bedside commode;Cane - single point  rollator   Additional Comments Pt lives on 1st floor apt with 1 step onto sidewalk   Lives With Alone   Prior Function   Level of Independence Independent;Independent with basic ADLs   Vocation Retired   ADL   Eating/Feeding Modified independent   Grooming Independent  except styling hair   Upper Body Bathing Use of adaptive equipment   Lower Body Bathing Modified independent   Upper Body Dressing Independent  using sports bra, pull over shirts   Lower Body Dressing Modified independent  elastic pants, slip on shoes   Toilet Tranfer Modified independent   [place] Transfer Modified independent  with grab bar   IADL   Shopping Needs to be accompanied on any shopping trip   Light Housekeeping Does personal laundry completely;Performs light daily tasks such as dishwashing, bed making  needs assist vacuuming, heay cleaning   Meal Prep Able to complete simple warm meal prep;Able to complete simple cold meal and snack prep  Family brings dinner by   Medication Management Is responsible for taking medication in correct dosages at correct time   Financial Management Manages day-to-day purchases, but needs help with banking, major purchases, etc.   Mobility   Mobility Status Needs assist  rollator   Written Expression   Dominant Hand Right   Handwriting --  denies change   Vision - History   Baseline Vision Wears glasses only for reading   Visual History Corrective eye surgery  cataract and retinal repair Rt eye   Vision Assessment   Ocular Range of Motion Within Functional Limits   Tracking/Visual Pursuits Able to track stimulus in all quads without difficulty   Convergence Within functional limits   D[place]opia Assessment --   denies   Cognition   Overall Cognitive Status Cognition to be further assessed in functional context PRN   Sensation   Light Touch Appears Intact   Additional Comments LUE: reports different compared to Rt side   Coordination   9 Hole Peg Test Right;Left   Right 9 Hole Peg Test 25.00 sec   Left 9 Hole Peg Test 32.16 sec   Edema   Edema NONE   ROM / Strength   AROM / PROM / Strength AROM;Strength   AROM   Overall AROM Comments BUE AROM WNL's,  however noted LUE with more difficulty   Strength   Overall Strength Comments BUE MMT grossly 3+/5 at shoulders   Hand Function   Right Hand Grip (lbs) 50 lbs   Left Hand Grip (lbs) 25 lbs                              OT Long Term Goals - 12/14/15 1311    OT LONG TERM GOAL #1   Title Independent with HEP for bilateral UE strength and Lt grip strength (all LTG's due 01/14/16)    Time 4   Period Weeks   Status New   OT LONG TERM GOAL #2   Title Independent with HEP for Lt hand coordination   Time 4   Period Weeks   Status New   OT LONG TERM GOAL #3   Title Improve grip strength Lt hand to 32 lbs or greater to assist with opening jars/containers   Baseline eval = 25 lbs (Rt = 50)   Time 4   Period Weeks   Status New   OT LONG TERM GOAL #4   Title Improve coordination as evidenced by performing 9 hole peg test in 28 sec. or less   Baseline eval = 32.16 sec   Time 4   Period Weeks   Status New   OT LONG TERM GOAL #5   Title Pt to verbalize understanding with potential DME to make tub transfers/bathing safer   Time 4   Period Weeks   Status New   Long Term Additional Goals   Additional Long Term Goals Yes   OT LONG TERM GOAL #6   Title Pt to report greater ease with styling/grooming hair w/ A/E prn   Time 4   Period Weeks   Status New               Plan - 12/14/15 1306    Clinical Impression Statement Pt is a 75 y.o. female who presents to outpatient rehab s/p CVA 02/18/15 with Lt hemiparesis.  Pt reports she only rec'd [facility] therapy. Pt also with Lt THR 05/18/15. Pt with recent Rt eye surgery for retinal repair. Pt also with h/o MI, DM, HTN, DVT, and mild stroke years ago. Pt's chief complaint in LT leg pain, leaning to Lt side and foot clearance. However pt does report decreased strength in Lt arm  and decreased Lt hand coordination which limits pt's ability to perform ADLS (styling hair and tub transfers safely) and IADLS.    Rehab Potential Fair   Clinical Impairments Affecting Rehab Potential time since CVA, Co-morbidities   OT Frequency 2x / week   OT Duration 4 weeks  plus evaluation   OT Treatment/Interventions Self-care/ADL training;Functional Mobility Training;Patient/family education;Neuromuscular education;Manual Therapy;Therapeutic exercises;Therapeutic activities;DME and/or AE instruction;Energy conservation;Electrical Stimulation;Passive range of motion;Visual/perceptual remediation/compensation;Moist Heat   Plan bilateral UE HEP (yellow theraband) and putty ex's for Lt hand   Consulted and Agree with Plan of Care Patient;Family member/caregiver   Family Member Consulted daughter      Patient will benefit from skilled therapeutic intervention in order to improve the following deficits and impairments:  Decreased coordination, Decreased range of motion, Decreased endurance, Decreased safety awareness, Impaired sensation, Impaired UE functional use, Decreased strength, Decreased mobility, Impaired vision/preception, Decreased knowledge of use of DME, Decreased activity tolerance, Decreased balance  Visit Diagnosis: Hemiplegia and hemiparesis following cerebral infarction affecting left non-dominant side ([place]) - Plan: Ot plan of care cert/re-cert  Muscle weakness (generalized) - Plan: Ot plan of care cert/re-cert  Other lack of coordination - Plan: Ot plan of care cert/re-cert      G-Codes - 32/20/25 1321    Functional Assessment Tool Used LUE: grip strength = 25 lbs, MMT  3+/5, 9 hole = 32.16 sec.    Functional Limitation Carrying, moving and handling objects   Carrying, Moving and Handling Objects Current Status [phone]) At least 40 percent but less than 60 percent impaired, limited or restricted   Carrying, Moving and Handling Objects Goal Status ([id number]) At least 20 percent but less than 40 percent impaired, limited or restricted      Problem List Patient Active Problem List   Diagnosis Date Noted  . Osteoarthritis of left hip 05/18/2015  . Status post total replacement of left hip 05/18/2015  . Coronary artery disease involving native coronary artery of native heart with angina pectoris ([place]) 04/16/2015  . History of stroke 04/16/2015  . Type 2 diabetes mellitus with circulatory disorder ([place]) 04/16/2015  . Cerebrovascular 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 artery ([place]) 04/16/2015  . HLD (hyperlipidemia)   . Acute CVA (cerebrovascular accident) ([place]) 02/19/2015  . Transient ischemic attack (TIA) 02/18/2015  . Stroke ([place])   . 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 Acute myocardial infarction of inferolateral wall (HCC) 04/12/2014  . Numbness 03/18/2014  . TIA (transient ischemic attack) 03/18/2014  . DM (diabetes mellitus) type II uncontrolled, periph vascular disorder ([place]) 03/18/2014  . Hyperlipidemia   . Hypertension   . PVD (peripheral vascular disease) ([place]) 03/05/2014  . Transient cerebral ischemia 08/01/2012  . Atherosclerosis of native arteries of extremity with intermittent claudication ([place]) 10/19/2011  . Unspecified disorders of arteries and arterioles 10/19/2011  . PAD (peripheral artery disease) ([place]) 04/13/2011    Carey Bullocks, OTR/L 12/14/2015, 1:25 PM  [facility] [address] [place], [place], [address] Phone: [phone]   Fax:  [phone]  Name: Eisha Chatterjee MRN: [id number] Date of  Birth: 1941/02/23

## 2015-12-15 DIAGNOSIS — M6281 Muscle weakness (generalized): Secondary | ICD-10-CM | POA: Diagnosis not present

## 2015-12-15 DIAGNOSIS — I69354 Hemiplegia and hemiparesis following cerebral infarction affecting left non-dominant side: Secondary | ICD-10-CM | POA: Diagnosis not present

## 2015-12-15 DIAGNOSIS — R278 Other lack of coordination: Secondary | ICD-10-CM | POA: Diagnosis not present

## 2015-12-15 DIAGNOSIS — R2689 Other abnormalities of gait and mobility: Secondary | ICD-10-CM | POA: Diagnosis not present

## 2015-12-15 NOTE — Therapy (Signed)
[facility] [address] [place], [place], [address] Phone: [phone]   Fax:  [phone]  Physical Therapy Evaluation  Patient Details  Name: [name] MRN: [id number] Date of Birth: [birth year] Referring Provider: Dr. Erlinda Hong  Encounter Date: 12/14/2015      PT End of Session - 12/15/15 1222    Visit Number 1   Number of Visits 9   Date for PT Re-Evaluation 01/13/16   Authorization Type [employer] Medicare-no auth per Lisa's notes . G-CODE AND PROGRESS NOTE EVERY 10TH VISIT.    PT Start Time 1239  pt arrived late   PT Stop Time 1310   PT Time Calculation (min) 31 min   Equipment Utilized During Treatment Gait belt   Activity Tolerance Patient tolerated treatment well   Behavior During Therapy WFL for tasks assessed/performed      Past Medical History  Diagnosis Date  . Peripheral vascular disease ([place])   . Hyperlipidemia   . Hypertension   . TIA (transient ischemic attack)   . Arthritis     osteoarthritis  . Aneurysm ([place])   . AAA (abdominal aortic aneurysm) ([place])   . Myocardial infarction ([place])   . Stroke ([place]) 01/2015    with left side weakness   . Pneumonia   . Diabetes mellitus     Type 2, on Glimiperide  . Kidney stones   . GERD (gastroesophageal reflux disease)   . Constipation   . Glaucoma   . Urinary incontinence     Past Surgical History  Procedure Laterality Date  . Uterine tumor    . Ventral hernia repair    . Abdominal aortic aneurysm repair    . Abdominal hysterectomy      partial  . Tubal ligation    . Back surgery    . Abdominal aortic aneurysm repair  ?2012  . Abdominal aortagram N/A 03/13/2014    Procedure: ABDOMINAL AORTAGRAM;  Surgeon: Elam Dutch, MD;  Location: [facility] CATH LAB;  Service: Cardiovascular;  Laterality: N/A;  . Left heart catheterization with coronary angiogram N/A 04/12/2014    Procedure: LEFT HEART CATHETERIZATION WITH CORONARY ANGIOGRAM;  Surgeon: Clent Demark, MD;  Location: [facility] CATH LAB;  Service: Cardiovascular;  Laterality: N/A;  . Percutaneous coronary stent intervention (pci-s)  04/12/2014    Procedure: PERCUTANEOUS CORONARY STENT INTERVENTION (PCI-S);  Surgeon: Clent Demark, MD;  Location: [facility] CATH LAB;  Service: Cardiovascular;;  prox and mid RCA  . Femoral-popliteal bypass graft Left 07/06/2014    Procedure: BYPASS GRAFT FEMORAL-POPLITEAL ARTERY;  Surgeon: Elam Dutch, MD;  Location: [facility];  Service: Vascular;  Laterality: Left;  Marland Kitchen Eye surgery Right     laser surgery for blood behind eye, loss of sight  . Cardiac catheterization    . Coronary angioplasty  03/2015  . Total hip arthroplasty Left 05/18/2015  . Joint replacement    . Total knee arthroplasty Left ~ 2003  . Hernia repair    . Total hip arthroplasty Left 05/18/2015    Procedure: LEFT TOTAL HIP ARTHROPLASTY ANTERIOR APPROACH;  Surgeon: Mcarthur Rossetti, MD;  Location: [facility];  Service: Orthopedics;  Laterality: Left;    There were no vitals filed for this visit.       Subjective Assessment - 12/14/15 1244    Subjective Pt s/p CVA on 02/18/2015, she reports she had HHPT but she still feels L-sided weakness and leans to the L side during amb. Pt reports L fem-pop surgery  06/2014 for PAD. Pt reported 1 fall in last 6 months, while trying to get onto toilet. Pt reported L THA in 04/2015, and R eye surgery approx. 3 months ago for glaucoma.  Pt reports she becomes anxious when in large crowds since CVA onset, and forgetful.    Pertinent History history of HTN, hyperlipidemia, DM, MI, CAD and MI s/p PCI, s/p AAA repair, TIA, stroke with mild residual left hemiparesis and dysarthria (01/2015), PAD s/p left femoral-popliteal bypass surgery-07/06/2014, tobacco use, DVT and arthritis, urinary incontinence   Patient Stated Goals Improve balance and walk without rollator, stand without using hands.   Currently in Pain? No/denies            [facility] PT Assessment - 12/14/15  1247    Assessment   Medical Diagnosis I63.311 (ICD-10-CM) - Cerebrovascular 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 artery [facility])   Referring Provider Dr. Erlinda Hong   Onset Date/Surgical Date 02/12/75   Prior Therapy HHPT   Precautions   Precautions Fall   Precaution Comments based on TUG time.   Restrictions   Weight Bearing Restrictions No   Balance Screen   Has the patient fallen in the past 6 months Yes   How many times? 1   Has the patient had a decrease in activity level because of a fear of falling?  Yes   Is the patient reluctant to leave their home because of a fear of falling?  No   Home Environment   Living Environment Private residence   Living Arrangements Alone   Available Help at Discharge Family   Type of [place] Access Level entry   Home Layout One level   [place] - 4 wheels;Bedside commode;Toilet riser;Other (comment);Cane - single point  shower stool   Prior Function   Level of Independence Independent   Vocation Retired   Leisure Talking with family but doesn't have many friends since she moved up here from [place] (10 years ago)   Cognition   Overall Cognitive Status Impaired/Different from baseline  pt report she can no longer    Memory Impaired   Memory Impairment --  pt reported she sometimes forgets how to cook   Sensation   Light Touch Impaired by gross assessment   Additional Comments L LE: decr. light touch sensation   Coordination   Gross Motor Movements are Fluid and Coordinated Yes   Heel Shin Test WNL   Tone   Assessment Location Other (comment)   Tone Assessment - Other   Other Tone Location No tone noted in BLE but pt reported intermittent muscle spasms   ROM / Strength   AROM / PROM / Strength AROM;Strength   AROM   Overall AROM Comments BLE AROM WNL, however, L ankle DF slightly decr. during amb.   Strength   Overall Strength Comments R LE WFL except for 3+/5 hamstring. LLE: 4/5 except for  3+/5 hamstring, 3/5 DF, and hip abd.   Transfers   Transfers Sit to Stand;Stand to Sit   Sit to Stand 5: Supervision;With upper extremity assist;From chair/3-in-1   Stand to Sit 5: Supervision;With upper extremity assist;To chair/3-in-1   Ambulation/Gait   Ambulation/Gait Yes   Ambulation/Gait Assistance 5: Supervision   Ambulation/Gait Assistance Details No overt LOB.   Ambulation Distance (Feet) 100 Feet   Assistive device Rollator   Gait Pattern Step-through pattern;Decreased stride length;Decreased dorsiflexion - left  guarded manner with B shoulder shrug-check rollator ht.  Ambulation Surface Level;Indoor   Gait velocity 1.4f/sec.   Standardized Balance Assessment   Standardized Balance Assessment Timed Up and Go Test   Timed Up and Go Test   TUG Normal TUG   Normal TUG (seconds) 26.8  with rollator                           PT Education - 12/15/15 1222    Education provided Yes   Education Details PT discussed outcome measure results and discussed PT frequency/duration.   Person(s) Educated Patient   Methods Explanation   Comprehension Verbalized understanding          PT Short Term Goals - 12/15/15 1228    PT SHORT TERM GOAL #1   Title same as LTGs           PT Long Term Goals - 12/15/15 1229    PT LONG TERM GOAL #1   Title Pt will be IND in HEP to improve strength and balance. Target date: 01/11/16   Status New   PT LONG TERM GOAL #2   Title Perform BERG and write goal prn. Target date: 01/11/16   Status New   PT LONG TERM GOAL #3   Title Pt will improve gait speed with LRAD to >/=2.67fsec to safely amb. in the community. Target date: 01/11/16   Status New   PT LONG TERM GOAL #4   Title Pt will perform TUG with LRAD in </=13.5 sec. to decr. falls risk. Target date: 01/11/16   Status New   PT LONG TERM GOAL #5   Title Pt will verbalize CVA signs/sx's to reduce risk of additonal CVA. Target date: 01/11/16   Status New   Additional  Long Term Goals   Additional Long Term Goals Yes   PT LONG TERM GOAL #6   Title Pt will amb. 500' over even/uneven terrain at MOD I level (incr. time) no AD, to improve functional mobilitly. Target date: 01/11/16   Status New               Plan - 12/14/15 1308    Clinical Impression Statement Pt is a pleasant 75y/o female presenting to OPPT neuro s/p CVA on 02/18/2015. Pt's PMH history significant for the following (obtained from MD note and pt's chart):  HTN, hyperlipidemia, DM, CAD and MI s/p PCI, s/p AAA repair, TIA, stroke with mild residual left hemiparesis and dysarthria, PAD s/p left femoral-popliteal bypass surgery-07/06/2014, tobacco use, DVT and arthritis, and urinary incontinence.  During PT exam pt found to experience the following deficits: gait deviations, impaired balance, decr. LE strength, decr. endurance, impaired sensation, and cognitive impairments per pt report. Pt's TUG time indicates pt is at risk for falls, and gait speed indicates pt is unable to safely amb. in the community. PT will perform BERG next session.  Pt may also benefit from speech evaluation for cognitive impairments.    Rehab Potential Good   Clinical Impairments Affecting Rehab Potential co-morbidities   PT Frequency 2x / week   PT Duration 4 weeks   PT Treatment/Interventions ADLs/Self Care Home Management;DME Instruction;Gait training;Stair training;Biofeedback;Canalith Repostioning;Vestibular;Manual techniques;Functional mobility training;Therapeutic activities;Therapeutic exercise;Electrical Stimulation;Balance training;Neuromuscular re-education;Cognitive remediation;Patient/family education;Orthotic Fit/Training;Passive range of motion   PT Next Visit Plan Perform BERG and write goal, initiate strength/balance HEP   Recommended Other Services Speech for memory   Consulted and Agree with Plan of Care Patient      Patient will benefit from skilled therapeutic intervention in  order to improve the  following deficits and impairments:  Abnormal gait, Decreased strength, Decreased knowledge of use of DME, Decreased mobility, Decreased balance, Impaired sensation, Decreased cognition, Decreased endurance  Visit Diagnosis: Other abnormalities of gait and mobility - Plan: PT plan of care cert/re-cert  Hemiplegia and hemiparesis following cerebral infarction affecting left non-dominant side ([place]) - Plan: PT plan of care cert/re-cert      G-Codes - 33/83/29 1231    Functional Assessment Tool Used Gait speed with rollator: 1.7f/sec; TUG with rollatr: 26.8sec.   Functional Limitation Mobility: Walking and moving around   Mobility: Walking and Moving Around Current Status [phone] At least 40 percent but less than 60 percent impaired, limited or restricted   Mobility: Walking and Moving Around Goal Status [phone] At least 1 percent but less than 20 percent impaired, limited or restricted       Problem List Patient Active Problem List   Diagnosis Date Noted  . Osteoarthritis of left hip 05/18/2015  . Status post total replacement of left hip 05/18/2015  . Coronary artery disease involving native coronary artery of native heart with angina pectoris ([place] 04/16/2015  . History of stroke 04/16/2015  . Type 2 diabetes mellitus with circulatory disorder ([place] 04/16/2015  . Cerebrovascular 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 artery ([place] 04/16/2015  . HLD (hyperlipidemia)   . Acute CVA (cerebrovascular accident) ([place] 02/19/2015  . Transient ischemic attack (TIA) 02/18/2015  . Stroke ([place]   . 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 Acute myocardial infarction of inferolateral wall (HCC) 04/12/2014  . Numbness 03/18/2014  . TIA (transient ischemic attack) 03/18/2014  . DM (diabetes mellitus) type II uncontrolled, periph vascular disorder ([place] 03/18/2014  . Hyperlipidemia   . Hypertension   . PVD (peripheral vascular disease)  ([place] 03/05/2014  . Transient cerebral ischemia 08/01/2012  . Atherosclerosis of native arteries of extremity with intermittent claudication ([place] 10/19/2011  . Unspecified disorders of arteries and arterioles 10/19/2011  . PAD (peripheral artery disease) ([place] 04/13/2011    Devan Babino L 12/15/2015, 12:33 PM  [facility] [address]Phone: [phone]  Fax:  [phone] Name: AKiersten CossMRN: [id number] of Birth: [birth year]  JGeoffry Paradise PT,DPT 12/15/2015 12:33 PM Phone: [phone]: [phone]

## 2015-12-20 ENCOUNTER — Emergency Department (HOSPITAL_COMMUNITY): Payer: Commercial Managed Care - HMO

## 2015-12-20 ENCOUNTER — Encounter (HOSPITAL_COMMUNITY): Payer: Self-pay | Admitting: Emergency Medicine

## 2015-12-20 ENCOUNTER — Ambulatory Visit: Payer: Commercial Managed Care - HMO | Admitting: Occupational Therapy

## 2015-12-20 DIAGNOSIS — Z96642 Presence of left artificial hip joint: Secondary | ICD-10-CM | POA: Insufficient documentation

## 2015-12-20 DIAGNOSIS — E119 Type 2 diabetes mellitus without complications: Secondary | ICD-10-CM | POA: Diagnosis not present

## 2015-12-20 DIAGNOSIS — Z96652 Presence of left artificial knee joint: Secondary | ICD-10-CM | POA: Insufficient documentation

## 2015-12-20 DIAGNOSIS — E1159 Type 2 diabetes mellitus with other circulatory complications: Secondary | ICD-10-CM | POA: Insufficient documentation

## 2015-12-20 DIAGNOSIS — Z8673 Personal history of transient ischemic attack (TIA), and cerebral infarction without residual deficits: Secondary | ICD-10-CM | POA: Insufficient documentation

## 2015-12-20 DIAGNOSIS — R6883 Chills (without fever): Secondary | ICD-10-CM | POA: Diagnosis present

## 2015-12-20 DIAGNOSIS — Z7984 Long term (current) use of oral hypoglycemic drugs: Secondary | ICD-10-CM | POA: Insufficient documentation

## 2015-12-20 DIAGNOSIS — I25119 Atherosclerotic heart disease of native coronary artery with unspecified angina pectoris: Secondary | ICD-10-CM | POA: Diagnosis not present

## 2015-12-20 DIAGNOSIS — J209 Acute bronchitis, unspecified: Secondary | ICD-10-CM | POA: Insufficient documentation

## 2015-12-20 DIAGNOSIS — R05 Cough: Secondary | ICD-10-CM | POA: Diagnosis not present

## 2015-12-20 DIAGNOSIS — R079 Chest pain, unspecified: Secondary | ICD-10-CM | POA: Diagnosis not present

## 2015-12-20 DIAGNOSIS — I10 Essential (primary) hypertension: Secondary | ICD-10-CM | POA: Diagnosis not present

## 2015-12-20 DIAGNOSIS — N289 Disorder of kidney and ureter, unspecified: Secondary | ICD-10-CM | POA: Insufficient documentation

## 2015-12-20 DIAGNOSIS — I252 Old myocardial infarction: Secondary | ICD-10-CM | POA: Diagnosis not present

## 2015-12-20 DIAGNOSIS — F17210 Nicotine dependence, cigarettes, uncomplicated: Secondary | ICD-10-CM | POA: Diagnosis not present

## 2015-12-20 DIAGNOSIS — Z7982 Long term (current) use of aspirin: Secondary | ICD-10-CM | POA: Insufficient documentation

## 2015-12-20 LAB — COMPREHENSIVE METABOLIC PANEL
ALBUMIN: 3.6 g/dL (ref 3.5–5.0)
ALK PHOS: 58 U/L (ref 38–126)
ALT: 12 U/L — ABNORMAL LOW (ref 14–54)
ANION GAP: 8 (ref 5–15)
AST: 16 U/L (ref 15–41)
BUN: 17 mg/dL (ref 6–20)
CALCIUM: 9.7 mg/dL (ref 8.9–10.3)
CO2: 24 mmol/L (ref 22–32)
Chloride: 105 mmol/L (ref 101–111)
Creatinine, Ser: 1.48 mg/dL — ABNORMAL HIGH (ref 0.44–1.00)
GFR calc non Af Amer: 33 mL/min — ABNORMAL LOW (ref >60)
GFR, EST AFRICAN AMERICAN: 38 mL/min — AB (ref >60)
GLUCOSE: 203 mg/dL — AB (ref 65–99)
POTASSIUM: 4.2 mmol/L (ref 3.5–5.1)
SODIUM: 137 mmol/L (ref 135–145)
Total Bilirubin: 0.5 mg/dL (ref 0.3–1.2)
Total Protein: 7.7 g/dL (ref 6.5–8.1)

## 2015-12-20 LAB — CBC WITH DIFFERENTIAL/PLATELET
BASOS PCT: 0 %
Basophils Absolute: 0 10*3/uL (ref 0.0–0.1)
EOS ABS: 0.3 10*3/uL (ref 0.0–0.7)
Eosinophils Relative: 4 %
HEMATOCRIT: 37.3 % (ref 36.0–46.0)
HEMOGLOBIN: 12.3 g/dL (ref 12.0–15.0)
LYMPHS ABS: 2.7 10*3/uL (ref 0.7–4.0)
Lymphocytes Relative: 37 %
MCH: 28.8 pg (ref 26.0–34.0)
MCHC: 33 g/dL (ref 30.0–36.0)
MCV: 87.4 fL (ref 78.0–100.0)
MONOS PCT: 5 %
Monocytes Absolute: 0.4 10*3/uL (ref 0.1–1.0)
NEUTROS ABS: 3.8 10*3/uL (ref 1.7–7.7)
NEUTROS PCT: 54 %
Platelets: 219 10*3/uL (ref 150–400)
RBC: 4.27 MIL/uL (ref 3.87–5.11)
RDW: 14.7 % (ref 11.5–15.5)
WBC: 7.1 10*3/uL (ref 4.0–10.5)

## 2015-12-20 LAB — I-STAT CG4 LACTIC ACID, ED: Lactic Acid, Venous: 0.95 mmol/L (ref 0.5–1.9)

## 2015-12-20 NOTE — ED Triage Notes (Signed)
Pt sts gen weakness, productive cough and chills x 3 days

## 2015-12-20 NOTE — ED Notes (Signed)
Pt called for vs reassessment, pt no answer, will try again

## 2015-12-21 ENCOUNTER — Encounter (HOSPITAL_COMMUNITY): Payer: Self-pay | Admitting: *Deleted

## 2015-12-21 ENCOUNTER — Emergency Department (HOSPITAL_COMMUNITY)
Admission: EM | Admit: 2015-12-21 | Discharge: 2015-12-21 | Disposition: A | Discharge status: Home / Self Care | Payer: Commercial Managed Care - HMO | Attending: Emergency Medicine | Admitting: Emergency Medicine

## 2015-12-21 DIAGNOSIS — N289 Disorder of kidney and ureter, unspecified: Secondary | ICD-10-CM

## 2015-12-21 DIAGNOSIS — J209 Acute bronchitis, unspecified: Secondary | ICD-10-CM

## 2015-12-21 LAB — CBG MONITORING, ED: GLUCOSE-CAPILLARY: 206 mg/dL — AB (ref 65–99)

## 2015-12-21 MED ORDER — ALBUTEROL SULFATE HFA 108 (90 BASE) MCG/ACT IN AERS: 2.0000 | INHALATION_SPRAY | RESPIRATORY_TRACT | 0 refills | Status: AC | PRN

## 2015-12-21 MED ORDER — IPRATROPIUM-ALBUTEROL 0.5-2.5 (3) MG/3ML IN SOLN: 3.0000 mL | Freq: Three times a day (TID) | RESPIRATORY_TRACT | Status: DC

## 2015-12-21 MED ORDER — IPRATROPIUM-ALBUTEROL 0.5-2.5 (3) MG/3ML IN SOLN
3.0000 mL | Freq: Once | RESPIRATORY_TRACT | Status: AC
  Administered 2015-12-21: 3 mL via RESPIRATORY_TRACT
  Filled 2015-12-21: qty 3

## 2015-12-21 MED ORDER — SODIUM CHLORIDE 0.9 % IV SOLN
1000.0000 mL | Freq: Once | INTRAVENOUS | Status: AC
  Administered 2015-12-21: 1000 mL via INTRAVENOUS

## 2015-12-21 MED ORDER — SODIUM CHLORIDE 0.9 % IV SOLN: 1000.0000 mL | INTRAVENOUS | Status: DC

## 2015-12-21 NOTE — Discharge Instructions (Addendum)
Your creatinine (a blood test of your kidney) was a little higher than it had been recently. Please have your doctor recheck it in 2-3 weeks.

## 2015-12-21 NOTE — ED Provider Notes (Signed)
[facility] DEPT Provider Note   CSN: [id number] Arrival date & time: 12/20/15  1743  First Provider Contact:  First MD Initiated Contact with Patient 12/21/15 0142     By signing my name below, I, Altamease Oiler, attest that this documentation has been prepared under the direction and in the presence of Delora Fuel, MD. Electronically Signed: Altamease Oiler, ED Scribe. 12/21/15. 1:50 AM   History   Chief Complaint Chief Complaint  Patient presents with  . Chills    HPI  The history is provided by the patient. No language interpreter was used.   [name] is a 76 y.o. female who presents to the Emergency Department complaining of a cough productive of pale yellow sputum with onset 1 week ago. Associated symptoms include 8-9/10 in severity myalgias, chills, sweating at night, chest pain yesterday (none today), SOB, nausea, vomiting, diarrhea this morning, and decreased appetite. Tussin and ibuprofen have provided some relief in symptoms at home. She has not been eating but states that she is drinking as usual. Pt denies fever.   Past Medical History:  Diagnosis Date  . AAA (abdominal aortic aneurysm) ([place])   . Aneurysm ([place])   . Arthritis    osteoarthritis  . Constipation   . Diabetes mellitus    Type 2, on Glimiperide  . GERD (gastroesophageal reflux disease)   . Glaucoma   . Hyperlipidemia   . Hypertension   . Kidney stones   . Myocardial infarction ([place])   . Peripheral vascular disease ([place])   . Pneumonia   . Stroke ([place]) 01/2015   with left side weakness   . TIA (transient ischemic attack)   . Urinary incontinence     Patient Active Problem List   Diagnosis Date Noted  . Osteoarthritis of left hip 05/18/2015  . Status post total replacement of left hip 05/18/2015  . Coronary artery disease involving native coronary artery of native heart with angina pectoris ([place]) 04/16/2015  . History of stroke 04/16/2015  . Type 2 diabetes mellitus with circulatory  disorder ([place]) 04/16/2015  . Cerebrovascular 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 artery ([place]) 04/16/2015  . HLD (hyperlipidemia)   . Acute CVA (cerebrovascular accident) ([place]) 02/19/2015  . Transient ischemic attack (TIA) 02/18/2015  . Stroke ([place])   . Cerebral infarction due to thrombosis of right middle cerebral artery ([place])   . Acute myocardial infarction of inferolateral wall (HCC) 04/12/2014  . Numbness 03/18/2014  . TIA (transient ischemic attack) 03/18/2014  . DM (diabetes mellitus) type II uncontrolled, periph vascular disorder ([place]) 03/18/2014  . Hyperlipidemia   . Hypertension   . PVD (peripheral vascular disease) ([place]) 03/05/2014  . Transient cerebral ischemia 08/01/2012  . Atherosclerosis of native arteries of extremity with intermittent claudication ([place]) 10/19/2011  . Unspecified disorders of arteries and arterioles 10/19/2011  . PAD (peripheral artery disease) ([place]) 04/13/2011    Past Surgical History:  Procedure Laterality Date  . ABDOMINAL AORTAGRAM N/A 03/13/2014   Procedure: ABDOMINAL Maxcine Ham;  Surgeon: Elam Dutch, MD;  Location[facility] LAB;  Service: Cardiovascular;  Laterality: N/A;  . ABDOMINAL AORTIC ANEURYSM REPAIR    . ABDOMINAL AORTIC ANEURYSM REPAIR  ?2012  . ABDOMINAL HYSTERECTOMY     partial  . BACK SURGERY    . CARDIAC CATHETERIZATION    . CORONARY ANGIOPLASTY  03/2015  . EYE SURGERY Right    laser surgery for blood behind eye, loss of sight  . FEMORAL-POPLITEAL BYPASS GRAFT Left 07/06/2014  Procedure: BYPASS GRAFT FEMORAL-POPLITEAL ARTERY;  Surgeon: Elam Dutch, MD;  Location: [facility];  Service: Vascular;  Laterality: Left;  . HERNIA REPAIR    . JOINT REPLACEMENT    . LEFT HEART CATHETERIZATION WITH CORONARY ANGIOGRAM N/A 04/12/2014   Procedure: LEFT HEART CATHETERIZATION WITH CORONARY ANGIOGRAM;  Surgeon: Clent Demark, MD;  Location: [facility] CATH LAB;  Service: Cardiovascular;  Laterality: N/A;  . PERCUTANEOUS  CORONARY STENT INTERVENTION (PCI-S)  04/12/2014   Procedure: PERCUTANEOUS CORONARY STENT INTERVENTION (PCI-S);  Surgeon: Clent Demark, MD;  Location: [facility] CATH LAB;  Service: Cardiovascular;;  prox and mid RCA  . TOTAL HIP ARTHROPLASTY Left 05/18/2015  . TOTAL HIP ARTHROPLASTY Left 05/18/2015   Procedure: LEFT TOTAL HIP ARTHROPLASTY ANTERIOR APPROACH;  Surgeon: Mcarthur Rossetti, MD;  Location: [facility];  Service: Orthopedics;  Laterality: Left;  . TOTAL KNEE ARTHROPLASTY Left ~ 2003  . TUBAL LIGATION    . uterine tumor    . VENTRAL HERNIA REPAIR      OB History    No data available       Home Medications    Prior to Admission medications   Medication Sig Start Date End Date Taking? Authorizing Provider  acetaminophen (TYLENOL) 325 MG tablet Take 2 tablets (650 mg total) by mouth every 6 (six) hours as needed for mild pain or moderate pain (or Fever >/= 101). 05/21/15   Mcarthur Rossetti, MD  amLODipine (NORVASC) 5 MG tablet Take 5 mg by mouth 2 (two) times daily.     Historical Provider, MD  aspirin EC 81 MG tablet Take 81 mg by mouth daily.    Historical Provider, MD  esomeprazole (NEXIUM) 20 MG capsule Take 20 mg by mouth as needed.    Historical Provider, MD  glimepiride (AMARYL) 2 MG tablet Take 2-3 mg by mouth 2 (two) times daily. Take 3 mg by mouth in the morning and take 2 mg by mouth in the afternoon (around 6 pm)    Historical Provider, MD  isosorbide mononitrate (IMDUR) 60 MG 24 hr tablet Take 60 mg by mouth daily. 02/24/15   Historical Provider, MD  methocarbamol (ROBAXIN) 500 MG tablet Take 1 tablet (500 mg total) by mouth every 6 (six) hours as needed for muscle spasms. 05/21/15   Mcarthur Rossetti, MD  metoprolol (TOPROL-XL) 50 MG 24 hr tablet Take 50 mg by mouth daily.     Historical Provider, MD  nitroGLYCERIN (NITROSTAT) 0.4 MG SL tablet Place 0.4 mg under the tongue every 5 (five) minutes as needed for chest pain.    Historical Provider, MD  pravastatin  (PRAVACHOL) 20 MG tablet Take 1 tablet (20 mg total) by mouth daily at 6 PM. 02/19/15   Dixie Dials, MD  ticagrelor (BRILINTA) 90 MG TABS tablet Take 1 tablet (90 mg total) by mouth 2 (two) times daily. 04/15/14   [name], MD  vitamin B-12 (CYANOCOBALAMIN) 50 MCG tablet Take 50 mcg by mouth daily.    Historical Provider, MD  Vitamin D, Ergocalciferol, (DRISDOL) 50000 UNITS CAPS capsule Take 50,000 Units by mouth every 7 (seven) days. fridays    Historical Provider, MD    Family History Family History  Problem Relation Age of Onset  . Diabetes Mother   . Stroke Mother   . Hypertension Father   . Stroke Father   . Hyperlipidemia Sister   . Hypertension Sister   . Aneurysm Sister   . Hyperlipidemia Brother   . Hypertension Brother  Social History Social History  Substance Use Topics  . Smoking status: Current Every Day Smoker    Packs/day: 0.50    Years: 40.00    Types: Cigarettes  . Smokeless tobacco: Never Used  . Alcohol use No     Allergies   Keflex [cephalexin]; Codeine; Lipitor [atorvastatin]; Azor [amlodipine-olmesartan]; Lisinopril; and Penicillins   Review of Systems Review of Systems  Constitutional: Positive for appetite change, chills and diaphoresis. Negative for fever.  Respiratory: Positive for cough and shortness of breath.   Cardiovascular: Positive for chest pain.  Gastrointestinal: Positive for diarrhea, nausea and vomiting.  Musculoskeletal: Positive for myalgias.  All other systems reviewed and are negative.    Physical Exam Updated Vital Signs BP 144/65 (BP Location: Right Arm)   Pulse 70   Temp 97.9 F (36.6 C) (Oral)   Resp 16   SpO2 97%   Physical Exam  Constitutional: She is oriented to person, place, and time. She appears well-developed and well-nourished. No distress.  HENT:  Head: Normocephalic and atraumatic.  Eyes: EOM are normal. Pupils are equal, round, and reactive to light.  Neck: Normal range of motion. Neck  supple. No JVD present.  Cardiovascular: Normal rate, regular rhythm and normal heart sounds.   No murmur heard. Pulmonary/Chest: Effort normal. She has no rales. She exhibits no tenderness.  Scattered wheezes at both lung bases No rales No rhonchi   Abdominal: Soft. She exhibits no distension and no mass. There is no tenderness.  Musculoskeletal: Normal range of motion.  Lymphadenopathy:    She has no cervical adenopathy.  Neurological: She is alert and oriented to person, place, and time.  Skin: Skin is warm and dry. No rash noted.  Psychiatric: She has a normal mood and affect. Her behavior is normal. Judgment and thought content normal.  Nursing note and vitals reviewed.    ED Treatments / Results  Labs (all labs ordered are listed, but only abnormal results are displayed) Labs Reviewed  COMPREHENSIVE METABOLIC PANEL - Abnormal; Notable for the following:       Result Value   Glucose, Bld 203 (*)    Creatinine, Ser 1.48 (*)    ALT 12 (*)    GFR calc non Af Amer 33 (*)    GFR calc Af Amer 38 (*)    All other components within normal limits  CBG MONITORING, ED - Abnormal; Notable for the following:    Glucose-Capillary 206 (*)    All other components within normal limits  CBC WITH DIFFERENTIAL/PLATELET  I-STAT CG4 LACTIC ACID, ED  I-STAT CG4 LACTIC ACID, ED    EKG  EKG Interpretation None       Radiology Dg Chest 2 View  Result Date: 12/20/2015 CLINICAL DATA:  Productive cough for 1 week. Left-sided chest pain. History of hypertension and diabetes. EXAM: CHEST  2 VIEW COMPARISON:  02/18/2015 and 07/03/2014 FINDINGS: The heart size and mediastinal contours are stable with aortic atherosclerosis. There is improved aeration of the lungs. Chronic lung disease is again noted with asymmetric subpleural reticulation in the right upper lobe. No superimposed edema, confluent airspace opacity or significant pleural effusion identified. The bones appear unchanged. Evidence of  right shoulder rotator cuff calcific tendinitis again noted. IMPRESSION: No evidence of pneumonia or other acute cardiopulmonary process. Stable chronic lung disease. Electronically Signed   By: Richardean Sale M.D.   On: 12/20/2015 19:22   Procedures Procedures (including critical care time)  Medications Ordered in ED Medications  0.9 %  sodium chloride infusion (not administered)    Followed by  0.9 %  sodium chloride infusion (not administered)  ipratropium-albuterol (DUONEB) 0.5-2.5 (3) MG/3ML nebulizer solution 3 mL (not administered)     Initial Impression / Assessment and Plan / ED Course  I have reviewed the triage vital signs and the nursing notes.  COORDINATION OF CARE: 1:47 AM Discussed treatment plan which includes lab work, CXR, IVF, and a breathing treatment with pt at bedside and pt agreed to plan.  Pertinent labs & imaging results that were available during my care of the patient were reviewed by me and considered in my medical decision making (see chart for details).  Clinical Course    Cough with chills. Old records are reviewed and she has no relevant past visits. Chest x-ray shows no evidence of pneumonia and WBC is normal. This appears to be a viral illness. Creatinine is noted to be slightly increased compared with baseline. She has not been eating well and she's given some IV fluids. She is given albuterol with ipratropium with good symptomatic relief. She is discharged with prescription for albuterol. Because of underlying diabetes, it is elected not to give her steroids. She is referred back to her PCP for recheck in several days. Also recommended repeat creatinine in 2 weeks.  Final Clinical Impressions(s) / ED Diagnoses   Final diagnoses:  Acute bronchitis, unspecified organism  Renal insufficiency    New Prescriptions Discharge Medication List as of 12/21/2015  4:00 AM    START taking these medications   Details  albuterol (PROVENTIL HFA;VENTOLIN  HFA) 108 (90 Base) MCG/ACT inhaler Inhale 2 puffs into the lungs every 4 (four) hours as needed for wheezing or shortness of breath (or coughing)., Starting Tue 12/21/2015, Print      I personally performed the services described in this documentation, which was scribed in my presence. The recorded information has been reviewed and is accurate.       Delora Fuel, MD 75/91/63 [id number]

## 2015-12-23 ENCOUNTER — Ambulatory Visit: Payer: Commercial Managed Care - HMO | Admitting: Occupational Therapy

## 2015-12-23 ENCOUNTER — Encounter: Payer: Self-pay | Admitting: Occupational Therapy

## 2015-12-23 ENCOUNTER — Encounter: Payer: Commercial Managed Care - HMO | Admitting: Occupational Therapy

## 2015-12-23 DIAGNOSIS — M6281 Muscle weakness (generalized): Secondary | ICD-10-CM | POA: Diagnosis not present

## 2015-12-23 DIAGNOSIS — R278 Other lack of coordination: Secondary | ICD-10-CM

## 2015-12-23 DIAGNOSIS — I69354 Hemiplegia and hemiparesis following cerebral infarction affecting left non-dominant side: Secondary | ICD-10-CM

## 2015-12-23 DIAGNOSIS — R2689 Other abnormalities of gait and mobility: Secondary | ICD-10-CM | POA: Diagnosis not present

## 2015-12-23 NOTE — Patient Instructions (Addendum)
1. Grip Strengthening (Resistive Putty)  Red Putty   Squeeze putty using thumb and all fingers. Repeat _10___ times. Do __2__ sessions per day.  Do at breakfast and at dinner.  This will help you remember.    2. Roll putty into tube on table and pinch between index, middle and thumb along the tube.  Do 2 times a day. Do at breakfast and diner to help you remember.    Coordination Activities  Perform the following activities for 15-20 minutes 1-2 times per day with left hand(s).  Rotate ball in fingertips (clockwise and counter-clockwise). Toss ball between hands. Toss ball in air and catch with the same hand. Flip cards 1 at a time as fast as you can. Pick up coins and place in container or coin bank. Pick up coins and stack.  Copyright  VHI. All rights reserved.

## 2015-12-23 NOTE — Therapy (Signed)
[facility] [address] [place], [place], [address] Phone: [phone]   Fax:  [phone]  Occupational Therapy Treatment  Patient Details  Name: [name] MRN: [id number] Date of Birth: 03-Oct-1940 Referring Provider: Dr. Rosalin Hawking  Encounter Date: 12/23/2015      OT End of Session - 12/23/15 1005    Visit Number 2   Number of Visits 9   Date for OT Re-Evaluation 01/14/16   Authorization Type [employer] MCR - G code needed   Authorization Time Period no auth or visit limit   Authorization - Visit Number 2   Authorization - Number of Visits 9   OT Start Time 0931   OT Stop Time 1013   OT Time Calculation (min) 42 min   Activity Tolerance Patient tolerated treatment well      Past Medical History:  Diagnosis Date  . AAA (abdominal aortic aneurysm) ([place])   . Aneurysm ([place])   . Arthritis    osteoarthritis  . Constipation   . Diabetes mellitus    Type 2, on Glimiperide  . GERD (gastroesophageal reflux disease)   . Glaucoma   . Hyperlipidemia   . Hypertension   . Kidney stones   . Myocardial infarction ([place])   . Peripheral vascular disease ([place])   . Pneumonia   . Stroke ([place]) 01/2015   with left side weakness   . TIA (transient ischemic attack)   . Urinary incontinence     Past Surgical History:  Procedure Laterality Date  . ABDOMINAL AORTAGRAM N/A 03/13/2014   Procedure: ABDOMINAL Maxcine Ham;  Surgeon: Elam Dutch, MD;  Location: [facility] CATH LAB;  Service: Cardiovascular;  Laterality: N/A;  . ABDOMINAL AORTIC ANEURYSM REPAIR    . ABDOMINAL AORTIC ANEURYSM REPAIR  ?2012  . ABDOMINAL HYSTERECTOMY     partial  . BACK SURGERY    . CARDIAC CATHETERIZATION    . CORONARY ANGIOPLASTY  03/2015  . EYE SURGERY Right    laser surgery for blood behind eye, loss of sight  . FEMORAL-POPLITEAL BYPASS GRAFT Left 07/06/2014   Procedure: BYPASS GRAFT FEMORAL-POPLITEAL ARTERY;  Surgeon: Elam Dutch, MD;   Location: [facility];  Service: Vascular;  Laterality: Left;  . HERNIA REPAIR    . JOINT REPLACEMENT    . LEFT HEART CATHETERIZATION WITH CORONARY ANGIOGRAM N/A 04/12/2014   Procedure: LEFT HEART CATHETERIZATION WITH CORONARY ANGIOGRAM;  Surgeon: Clent Demark, MD;  Location: [facility] CATH LAB;  Service: Cardiovascular;  Laterality: N/A;  . PERCUTANEOUS CORONARY STENT INTERVENTION (PCI-S)  04/12/2014   Procedure: PERCUTANEOUS CORONARY STENT INTERVENTION (PCI-S);  Surgeon: Clent Demark, MD;  Location: [facility] CATH LAB;  Service: Cardiovascular;;  prox and mid RCA  . TOTAL HIP ARTHROPLASTY Left 05/18/2015  . TOTAL HIP ARTHROPLASTY Left 05/18/2015   Procedure: LEFT TOTAL HIP ARTHROPLASTY ANTERIOR APPROACH;  Surgeon: Mcarthur Rossetti, MD;  Location: [facility];  Service: Orthopedics;  Laterality: Left;  . TOTAL KNEE ARTHROPLASTY Left ~ 2003  . TUBAL LIGATION    . uterine tumor    . VENTRAL HERNIA REPAIR      There were no vitals filed for this visit.      Subjective Assessment - 12/23/15 0932    Subjective  I am not sure I feel well enough to be here today    Pertinent History CVA 01/2015, Lt THR 05/18/15, MI 2016, Rt eye surgery 2017, DM, HTN   Patient Stated Goals To get my Lt arm stronger so I  can style my hair   Currently in Pain? Yes   Pain Score 6    Pain Location Hip   Pain Orientation Left   Pain Descriptors / Indicators Sharp   Pain Onset More than a month ago   Pain Frequency Intermittent   Aggravating Factors  only when I walk - I sat too long in the ER yesterday   Pain Relieving Factors walking, exercise, heat.                      OT Treatments/Exercises (OP) - 12/23/15 0001      Exercises   Exercises Hand     Hand Exercises   Theraputty - Flatten Instructed pt in HEP to address grip strength and pinch strength using red putty. After practice pt able to return demonstrate all activities. See pt instruction for details.      Fine Motor Coordination   Other Fine  Motor Exercises Instructed pt in HEP to address fine motor control - after practice pt able to return demonstrate all activities - see pt instruction section for details.                OT Education - 12/23/15 1000    Education provided Yes   Education Details putty and coordination HEP   Person(s) Educated Patient   Methods Explanation;Demonstration;Verbal cues;Handout   Comprehension Verbalized understanding;Returned demonstration             OT Long Term Goals - 12/23/15 1004      OT LONG TERM GOAL #1   Title Independent with HEP for bilateral UE strength and Lt grip strength (all LTG's due 01/14/16)    Time 4   Period Weeks   Status New     OT LONG TERM GOAL #2   Title Independent with HEP for Lt hand coordination   Time 4   Period Weeks   Status New     OT LONG TERM GOAL #3   Title Improve grip strength Lt hand to 32 lbs or greater to assist with opening jars/containers   Baseline eval = 25 lbs (Rt = 50)   Time 4   Period Weeks   Status New     OT LONG TERM GOAL #4   Title Improve coordination as evidenced by performing 9 hole peg test in 28 sec. or less   Baseline eval = 32.16 sec   Time 4   Period Weeks   Status New     OT LONG TERM GOAL #5   Title Pt to verbalize understanding with potential DME to make tub transfers/bathing safer   Time 4   Period Weeks   Status New     OT LONG TERM GOAL #6   Title Pt to report greater ease with styling/grooming hair w/ A/E prn   Time 4   Period Weeks   Status New               Plan - 12/23/15 1004    Clinical Impression Statement Pt progressing towrard goals. Pt issued HEP   Rehab Potential Fair   Clinical Impairments Affecting Rehab Potential time since CVA, Co-morbidities   OT Frequency 2x / week   OT Duration 4 weeks   OT Treatment/Interventions Self-care/ADL training;Functional Mobility Training;Patient/family education;Neuromuscular education;Manual Therapy;Therapeutic  exercises;Therapeutic activities;DME and/or AE instruction;Energy conservation;Electrical Stimulation;Passive range of motion;Visual/perceptual remediation/compensation;Moist Heat   Plan check HEP.  Address grip strength and coordination.  next session address UE HEP strengthening HEP  Consulted and Agree with Plan of Care Patient      Patient will benefit from skilled therapeutic intervention in order to improve the following deficits and impairments:  Decreased coordination, Decreased range of motion, Decreased endurance, Decreased safety awareness, Impaired sensation, Impaired UE functional use, Decreased strength, Decreased mobility, Impaired vision/preception, Decreased knowledge of use of DME, Decreased activity tolerance, Decreased balance  Visit Diagnosis: Hemiplegia and hemiparesis following cerebral infarction affecting left non-dominant side (HCC)  Muscle weakness (generalized)  Other lack of coordination    Problem List Patient Active Problem List   Diagnosis Date Noted  . Osteoarthritis of left hip 05/18/2015  . Status post total replacement of left hip 05/18/2015  . Coronary artery disease involving native coronary artery of native heart with angina pectoris ([place]) 04/16/2015  . History of stroke 04/16/2015  . Type 2 diabetes mellitus with circulatory disorder ([place]) 04/16/2015  . Cerebrovascular 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 artery ([place]) 04/16/2015  . HLD (hyperlipidemia)   . Acute CVA (cerebrovascular accident) ([place]) 02/19/2015  . Transient ischemic attack (TIA) 02/18/2015  . Stroke ([place])   . Cerebral infarction due to thrombosis of right middle cerebral artery ([place])   . Acute myocardial infarction of inferolateral wall (HCC) 04/12/2014  . Numbness 03/18/2014  . TIA (transient ischemic attack) 03/18/2014  . DM (diabetes mellitus) type II uncontrolled, periph vascular disorder ([place]) 03/18/2014  . Hyperlipidemia   . Hypertension   . PVD  (peripheral vascular disease) ([place]) 03/05/2014  . Transient cerebral ischemia 08/01/2012  . Atherosclerosis of native arteries of extremity with intermittent claudication ([place]) 10/19/2011  . Unspecified disorders of arteries and arterioles 10/19/2011  . PAD (peripheral artery disease) ([place]) 04/13/2011    Quay Burow, OTR/L 12/23/2015, 10:13 AM  [facility] [address] [place], [place], [address] Phone: [phone]   Fax:  [phone]  Name: Fleda Pagel MRN: [id number] Date of Birth: [birth year]

## 2015-12-24 ENCOUNTER — Encounter: Payer: Self-pay | Admitting: Physical Therapy

## 2015-12-24 ENCOUNTER — Other Ambulatory Visit: Payer: Self-pay | Admitting: Neurology

## 2015-12-24 ENCOUNTER — Ambulatory Visit: Payer: Commercial Managed Care - HMO | Admitting: Physical Therapy

## 2015-12-24 DIAGNOSIS — M6281 Muscle weakness (generalized): Secondary | ICD-10-CM | POA: Diagnosis not present

## 2015-12-24 DIAGNOSIS — I63311 Cerebral infarction due to thrombosis of right middle cerebral artery: Secondary | ICD-10-CM

## 2015-12-24 DIAGNOSIS — I69354 Hemiplegia and hemiparesis following cerebral infarction affecting left non-dominant side: Secondary | ICD-10-CM | POA: Diagnosis not present

## 2015-12-24 DIAGNOSIS — R2689 Other abnormalities of gait and mobility: Secondary | ICD-10-CM

## 2015-12-24 DIAGNOSIS — R278 Other lack of coordination: Secondary | ICD-10-CM

## 2015-12-24 NOTE — Patient Instructions (Addendum)
Functional Quadriceps: Sit to Stand    Sit on edge of chair, feet flat on floor. Stand upright, extending knees fully. Use arms to slow yourself down when sitting "no plopping". Repeat __10_ times per set. Do __1__ sets per session. Do __1-2__ sessions per day.  http://orth.exer.us/735   Copyright  VHI. All rights reserved.    "I love a Engineer, mining top for balance (hold onto it with your right hand); high knee marching forward along counter top and then high knee marching backwards along counter top. Repeat 3 laps each way. Do _1-2___ sessions per day.  http://gt2.exer.us/345   Copyright  VHI. All rights reserved.    Feet Heel-Toe "Tandem"    At counter top holding on with right hand for balance: left arm at side, walk a straight line forward (one foot in front of the other one), then walk a straight line backwards (one foot behind the other one). Repeat for 3 laps each way. Do _1-2_ sessions per day.  Copyright  VHI. All rights reserved.

## 2015-12-26 NOTE — Therapy (Addendum)
[facility] [address] [place], [place], [address] Phone: [phone]   Fax:  [phone]  Physical Therapy Treatment  Patient Details  Name: [name] MRN: [id number] Date of Birth: [birth year] Referring Provider: Dr. Erlinda Hong  Encounter Date: 12/24/2015     12/24/15 1021  PT Visits / Re-Eval  Visit Number 2  Number of Visits 9  Date for PT Re-Evaluation 01/13/16  Authorization  Authorization Type [employer]-no auth per Lisa's notes . G-CODE AND PROGRESS NOTE EVERY 10TH VISIT.   PT Time Calculation  PT Start Time 1018  PT Stop Time 1100  PT Time Calculation (min) 42 min  PT - End of Session  Equipment Utilized During Treatment Gait belt  Activity Tolerance Patient tolerated treatment well  Behavior During Therapy WFL for tasks assessed/performed    Past Medical History:  Diagnosis Date  . AAA (abdominal aortic aneurysm) ([place])   . Aneurysm ([place])   . Arthritis    osteoarthritis  . Constipation   . Diabetes mellitus    Type 2, on Glimiperide  . GERD (gastroesophageal reflux disease)   . Glaucoma   . Hyperlipidemia   . Hypertension   . Kidney stones   . Myocardial infarction ([place])   . Peripheral vascular disease ([place])   . Pneumonia   . Stroke ([place]) 01/2015   with left side weakness   . TIA (transient ischemic attack)   . Urinary incontinence     Past Surgical History:  Procedure Laterality Date  . ABDOMINAL AORTAGRAM N/A 03/13/2014   Procedure: ABDOMINAL Maxcine Ham;  Surgeon: Elam Dutch, MD;  Location: [facility] CATH LAB;  Service: Cardiovascular;  Laterality: N/A;  . ABDOMINAL AORTIC ANEURYSM REPAIR    . ABDOMINAL AORTIC ANEURYSM REPAIR  ?2012  . ABDOMINAL HYSTERECTOMY     partial  . BACK SURGERY    . CARDIAC CATHETERIZATION    . CORONARY ANGIOPLASTY  03/2015  . EYE SURGERY Right    laser surgery for blood behind eye, loss of sight  . FEMORAL-POPLITEAL BYPASS GRAFT Left 07/06/2014   Procedure: BYPASS GRAFT FEMORAL-POPLITEAL ARTERY;  Surgeon: Elam Dutch, MD;  Location: [facility];  Service: Vascular;  Laterality: Left;  . HERNIA REPAIR    . JOINT REPLACEMENT    . LEFT HEART CATHETERIZATION WITH CORONARY ANGIOGRAM N/A 04/12/2014   Procedure: LEFT HEART CATHETERIZATION WITH CORONARY ANGIOGRAM;  Surgeon: Clent Demark, MD;  Location: [facility] CATH LAB;  Service: Cardiovascular;  Laterality: N/A;  . PERCUTANEOUS CORONARY STENT INTERVENTION (PCI-S)  04/12/2014   Procedure: PERCUTANEOUS CORONARY STENT INTERVENTION (PCI-S);  Surgeon: Clent Demark, MD;  Location: [facility] CATH LAB;  Service: Cardiovascular;;  prox and mid RCA  . TOTAL HIP ARTHROPLASTY Left 05/18/2015  . TOTAL HIP ARTHROPLASTY Left 05/18/2015   Procedure: LEFT TOTAL HIP ARTHROPLASTY ANTERIOR APPROACH;  Surgeon: Mcarthur Rossetti, MD;  Location: [facility];  Service: Orthopedics;  Laterality: Left;  . TOTAL KNEE ARTHROPLASTY Left ~ 2003  . TUBAL LIGATION    . uterine tumor    . VENTRAL HERNIA REPAIR      There were no vitals filed for this visit.     12/24/15 1020  Symptoms/Limitations  Subjective No pain or falls to report. Does report feeling very tired today.  Pertinent History history of HTN, hyperlipidemia, DM, MI, CAD and MI s/p PCI, s/p AAA repair, TIA, stroke with mild residual left hemiparesis and dysarthria (01/2015), PAD s/p left femoral-popliteal bypass surgery-07/06/2014, tobacco use, DVT and arthritis, urinary  incontinence  Patient Stated Goals Improve balance and walk without rollator, stand without using hands.  Pain Assessment  Currently in Pain? No/denies  Pain Score 0      12/24/15 1023  Standardized Balance Assessment  Standardized Balance Assessment Berg Balance Test  Berg Balance Test  Sit to Stand 3  Standing Unsupported 3  Sitting with Back Unsupported but Feet Supported on Floor or Stool 4  Stand to Sit 3  Transfers 3  Standing Unsupported with Eyes Closed 3  Standing Ubsupported  with Feet Together 3  From Standing, Reach Forward with Outstretched Arm 2 (8 inches)  From Standing Position, Pick up Object from Floor 3  From Standing Position, Turn to Look Behind Over each Shoulder 2  Turn 360 Degrees 2 (> 8 sec's each way)  Standing Unsupported, Alternately Place Feet on Step/Stool 2  Standing Unsupported, One Foot in Front 3  Standing on One Leg 1  Total Score 37  Berg comment: 37/56= significant risk (>80%) risk of falling       Issued the following to HEP:  Functional Quadriceps: Sit to Stand    Sit on edge of chair, feet flat on floor. Stand upright, extending knees fully. Use arms to slow yourself down when sitting "no plopping". Repeat __10_ times per set. Do __1__ sets per session. Do __1-2__ sessions per day.  http://orth.exer.us/735   Copyright  VHI. All rights reserved.    "I love a Engineer, mining top for balance (hold onto it with your right hand); high knee marching forward along counter top and then high knee marching backwards along counter top. Repeat 3 laps each way. Do _1-2___ sessions per day.  http://gt2.exer.us/345   Copyright  VHI. All rights reserved.    Feet Heel-Toe "Tandem"    At counter top holding on with right hand for balance: left arm at side, walk a straight line forward (one foot in front of the other one), then walk a straight line backwards (one foot behind the other one). Repeat for 3 laps each way. Do _1-2_ sessions per day.  Copyright  VHI. All rights reserved.           PT Short Term Goals - 12/15/15 1228      PT SHORT TERM GOAL #1   Title same as LTGs          PT Long Term Goals - 12/28/15 1356      PT LONG TERM GOAL #1   Title Pt will be IND in HEP to improve strength and balance. Target date: 01/11/16   Status On-going     PT LONG TERM GOAL #2   Title Pt will improve BERG score to >/=45/56 to decr. falls risk. Target date: 01/11/16   Status Revised     PT LONG  TERM GOAL #3   Title Pt will improve gait speed with LRAD to >/=2.16f/sec to safely amb. in the community. Target date: 01/11/16   Status On-going     PT LONG TERM GOAL #4   Title Pt will perform TUG with LRAD in </=13.5 sec. to decr. falls risk. Target date: 01/11/16   Status On-going     PT LONG TERM GOAL #5   Title Pt will verbalize CVA signs/sx's to reduce risk of additonal CVA. Target date: 01/11/16   Status On-going     PT LONG TERM GOAL #6   Title Pt will amb. 500' over even/uneven terrain at MOD I level (incr. time)  no AD, to improve functional mobilitly. Target date: 01/11/16   Status On-going         12/24/15 1021  Plan  Clinical Impression Statement Berg balance test performed today to establish baseline, primary PT to set goal based off today's score. Remainder of session addressed issuing HEP for strengthening and balance at home. Pt is making steady progress toward goals.                                Pt will benefit from skilled therapeutic intervention in order to improve on the following deficits Abnormal gait;Decreased strength;Decreased knowledge of use of DME;Decreased mobility;Decreased balance;Impaired sensation;Decreased cognition;Decreased endurance  Rehab Potential Good  Clinical Impairments Affecting Rehab Potential co-morbidities  PT Frequency 2x / week  PT Duration 4 weeks  PT Treatment/Interventions ADLs/Self Care Home Management;DME Instruction;Gait training;Stair training;Biofeedback;Canalith Repostioning;Vestibular;Manual techniques;Functional mobility training;Therapeutic activities;Therapeutic exercise;Electrical Stimulation;Balance training;Neuromuscular re-education;Cognitive remediation;Patient/family education;Orthotic Fit/Training;Passive range of motion  PT Next Visit Plan continue to work on gait, stengthening and balance toward STGs. advance HEP as needed.  Consulted and Agree with Plan of Care Patient        Patient will benefit from  skilled therapeutic intervention in order to improve the following deficits and impairments:  Abnormal gait, Decreased strength, Decreased knowledge of use of DME, Decreased mobility, Decreased balance, Impaired sensation, Decreased cognition, Decreased endurance  Visit Diagnosis: Hemiplegia and hemiparesis following cerebral infarction affecting left non-dominant side (HCC)  Muscle weakness (generalized)  Other lack of coordination  Other abnormalities of gait and mobility     Problem List Patient Active Problem List   Diagnosis Date Noted  . Osteoarthritis of left hip 05/18/2015  . Status post total replacement of left hip 05/18/2015  . Coronary artery disease involving native coronary artery of native heart with angina pectoris ([place]) 04/16/2015  . History of stroke 04/16/2015  . Type 2 diabetes mellitus with circulatory disorder ([place]) 04/16/2015  . Cerebrovascular 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 artery ([place]) 04/16/2015  . HLD (hyperlipidemia)   . Acute CVA (cerebrovascular accident) ([place]) 02/19/2015  . Transient ischemic attack (TIA) 02/18/2015  . Stroke ([place])   . Cerebral infarction due to thrombosis of right middle cerebral artery ([place])   . Acute myocardial infarction of inferolateral wall (HCC) 04/12/2014  . Numbness 03/18/2014  . TIA (transient ischemic attack) 03/18/2014  . DM (diabetes mellitus) type II uncontrolled, periph vascular disorder ([place]) 03/18/2014  . Hyperlipidemia   . Hypertension   . PVD (peripheral vascular disease) ([place]) 03/05/2014  . Transient cerebral ischemia 08/01/2012  . Atherosclerosis of native arteries of extremity with intermittent claudication ([place]) 10/19/2011  . Unspecified disorders of arteries and arterioles 10/19/2011  . PAD (peripheral artery disease) ([place]) 04/13/2011   Willow Ora, PTA, [facility] [address], [place], [place] [address] [phone] 12/26/15, 3:22 PM   Name:  [name] MRN: [id number] Date of Birth: Nov 25, 1940   Pt goals updated by PT. Geoffry Paradise, PT,DPT 12/28/15 1:57 PM Phone: [phone] Fax: [phone]

## 2015-12-28 ENCOUNTER — Ambulatory Visit: Payer: Commercial Managed Care - HMO | Admitting: Occupational Therapy

## 2015-12-28 ENCOUNTER — Encounter: Payer: Self-pay | Admitting: Occupational Therapy

## 2015-12-28 ENCOUNTER — Ambulatory Visit: Payer: Commercial Managed Care - HMO | Attending: Neurology | Admitting: Physical Therapy

## 2015-12-28 ENCOUNTER — Encounter: Payer: Self-pay | Admitting: Physical Therapy

## 2015-12-28 DIAGNOSIS — R278 Other lack of coordination: Secondary | ICD-10-CM | POA: Insufficient documentation

## 2015-12-28 DIAGNOSIS — I69354 Hemiplegia and hemiparesis following cerebral infarction affecting left non-dominant side: Secondary | ICD-10-CM | POA: Diagnosis not present

## 2015-12-28 DIAGNOSIS — M6281 Muscle weakness (generalized): Secondary | ICD-10-CM

## 2015-12-28 DIAGNOSIS — R2689 Other abnormalities of gait and mobility: Secondary | ICD-10-CM | POA: Diagnosis not present

## 2015-12-28 NOTE — Therapy (Signed)
[facility] [address] [place], [place], [address] Phone: [phone]   Fax:  [phone]  Occupational Therapy Treatment  Patient Details  Name: [name] MRN: [id number] Date of Birth: Sep 07, 1940 Referring Provider: Dr. Rosalin Hawking  Encounter Date: 12/28/2015      OT End of Session - 12/28/15 0841    Visit Number 3   Number of Visits 9   Date for OT Re-Evaluation 01/14/16   Authorization Type [employer] MCR - G code needed   Authorization Time Period no auth or visit limit   Authorization - Visit Number 3   Authorization - Number of Visits 9   OT Start Time 0801   OT Stop Time 0943   OT Time Calculation (min) 102 min   Activity Tolerance Patient tolerated treatment well      Past Medical History:  Diagnosis Date  . AAA (abdominal aortic aneurysm) ([place])   . Aneurysm ([place])   . Arthritis    osteoarthritis  . Constipation   . Diabetes mellitus    Type 2, on Glimiperide  . GERD (gastroesophageal reflux disease)   . Glaucoma   . Hyperlipidemia   . Hypertension   . Kidney stones   . Myocardial infarction ([place])   . Peripheral vascular disease ([place])   . Pneumonia   . Stroke ([place]) 01/2015   with left side weakness   . TIA (transient ischemic attack)   . Urinary incontinence     Past Surgical History:  Procedure Laterality Date  . ABDOMINAL AORTAGRAM N/A 03/13/2014   Procedure: ABDOMINAL Maxcine Ham;  Surgeon: Elam Dutch, MD;  Location[facility] CATH LAB;  Service: Cardiovascular;  Laterality: N/A;  . ABDOMINAL AORTIC ANEURYSM REPAIR    . ABDOMINAL AORTIC ANEURYSM REPAIR  ?2012  . ABDOMINAL HYSTERECTOMY     partial  . BACK SURGERY    . CARDIAC CATHETERIZATION    . CORONARY ANGIOPLASTY  03/2015  . EYE SURGERY Right    laser surgery for blood behind eye, loss of sight  . FEMORAL-POPLITEAL BYPASS GRAFT Left 07/06/2014   Procedure: BYPASS GRAFT FEMORAL-POPLITEAL ARTERY;  Surgeon: Elam Dutch, MD;   Location: [facility];  Service: Vascular;  Laterality: Left;  . HERNIA REPAIR    . JOINT REPLACEMENT    . LEFT HEART CATHETERIZATION WITH CORONARY ANGIOGRAM N/A 04/12/2014   Procedure: LEFT HEART CATHETERIZATION WITH CORONARY ANGIOGRAM;  Surgeon: Clent Demark, MD;  Location: [facility] CATH LAB;  Service: Cardiovascular;  Laterality: N/A;  . PERCUTANEOUS CORONARY STENT INTERVENTION (PCI-S)  04/12/2014   Procedure: PERCUTANEOUS CORONARY STENT INTERVENTION (PCI-S);  Surgeon: Clent Demark, MD;  Location: [facility] CATH LAB;  Service: Cardiovascular;;  prox and mid RCA  . TOTAL HIP ARTHROPLASTY Left 05/18/2015  . TOTAL HIP ARTHROPLASTY Left 05/18/2015   Procedure: LEFT TOTAL HIP ARTHROPLASTY ANTERIOR APPROACH;  Surgeon: Mcarthur Rossetti, MD;  Location: [facility];  Service: Orthopedics;  Laterality: Left;  . TOTAL KNEE ARTHROPLASTY Left ~ 2003  . TUBAL LIGATION    . uterine tumor    . VENTRAL HERNIA REPAIR      There were no vitals filed for this visit.      Subjective Assessment - 12/28/15 0804    Subjective  I am doing my hand exercises as often as I can.   Pertinent History CVA 01/2015, Lt THR 05/18/15, MI 2016, Rt eye surgery 2017, DM, HTN   Patient Stated Goals To get my Lt arm stronger so I can style  my hair   Currently in Pain? No/denies                      OT Treatments/Exercises (OP) - 12/28/15 0001      Exercises   Exercises Hand;Shoulder     Shoulder Exercises: Seated   Other Seated Exercises Pt reported that she had muscle aches "all over my body " for the past 4 days therefore delayed initiating HEP targeted at UE proximal strength. Will attempt to introduce next session.       Hand Exercises   Theraputty - Locate Pegs 15 pegs in red putty with min difficulty and extra time.    Hand Gripper with Small Beads Gripper on 25# of resistance to pick up one inch blocks with min dropping and increased time.  Pt unable to do 35# of resistance.  Pt reports that she is doing  putty at home as often as she can.      Fine Motor Coordination   Other Fine Motor Exercises Fine motor activities with emphasis on in hand manipulation - pt needs extra time improving in ability to manipulate smaller objects in hand.                       OT Long Term Goals - 12/28/15 0840      OT LONG TERM GOAL #1   Title Independent with HEP for bilateral UE strength and Lt grip strength (all LTG's due 01/14/16)    Time 4   Period Weeks   Status On-going     OT LONG TERM GOAL #2   Title Independent with HEP for Lt hand coordination   Time 4   Period Weeks   Status On-going     OT LONG TERM GOAL #3   Title Improve grip strength Lt hand to 32 lbs or greater to assist with opening jars/containers   Baseline eval = 25 lbs (Rt = 50)   Time 4   Period Weeks   Status On-going     OT LONG TERM GOAL #4   Title Improve coordination as evidenced by performing 9 hole peg test in 28 sec. or less   Baseline eval = 32.16 sec   Time 4   Period Weeks   Status On-going     OT LONG TERM GOAL #5   Title Pt to verbalize understanding with potential DME to make tub transfers/bathing safer   Time 4   Period Weeks   Status On-going     OT LONG TERM GOAL #6   Title Pt to report greater ease with styling/grooming hair w/ A/E prn   Time 4   Period Weeks   Status On-going               Plan - 12/28/15 0840    Clinical Impression Statement Pt slowly progressing toward goals. Pt reports that she feels her left hand is stronger   Rehab Potential Fair   Clinical Impairments Affecting Rehab Potential time since CVA, Co-morbidities   OT Frequency 2x / week   OT Duration 4 weeks   OT Treatment/Interventions Self-care/ADL training;Functional Mobility Training;Patient/family education;Neuromuscular education;Manual Therapy;Therapeutic exercises;Therapeutic activities;DME and/or AE instruction;Energy conservation;Electrical Stimulation;Passive range of motion;Visual/perceptual  remediation/compensation;Moist Heat   Plan if possible intiate UE HEP   Consulted and Agree with Plan of Care Patient      Patient will benefit from skilled therapeutic intervention in order to improve the following deficits and impairments:  Decreased  coordination, Decreased range of motion, Decreased endurance, Decreased safety awareness, Impaired sensation, Impaired UE functional use, Decreased strength, Decreased mobility, Impaired vision/preception, Decreased knowledge of use of DME, Decreased activity tolerance, Decreased balance  Visit Diagnosis: Hemiplegia and hemiparesis following cerebral infarction affecting left non-dominant side (HCC)  Muscle weakness (generalized)  Other lack of coordination    Problem List Patient Active Problem List   Diagnosis Date Noted  . Osteoarthritis of left hip 05/18/2015  . Status post total replacement of left hip 05/18/2015  . Coronary artery disease involving native coronary artery of native heart with angina pectoris ([place]) 04/16/2015  . History of stroke 04/16/2015  . Type 2 diabetes mellitus with circulatory disorder ([place]) 04/16/2015  . Cerebrovascular 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 artery ([place]) 04/16/2015  . HLD (hyperlipidemia)   . Acute CVA (cerebrovascular accident) ([place]) 02/19/2015  . Transient ischemic attack (TIA) 02/18/2015  . Stroke ([place])   . Cerebral infarction due to thrombosis of right middle cerebral artery ([place])   . Acute myocardial infarction of inferolateral wall (HCC) 04/12/2014  . Numbness 03/18/2014  . TIA (transient ischemic attack) 03/18/2014  . DM (diabetes mellitus) type II uncontrolled, periph vascular disorder ([place]) 03/18/2014  . Hyperlipidemia   . Hypertension   . PVD (peripheral vascular disease) ([place]) 03/05/2014  . Transient cerebral ischemia 08/01/2012  . Atherosclerosis of native arteries of extremity with intermittent claudication ([place]) 10/19/2011  . Unspecified disorders of  arteries and arterioles 10/19/2011  . PAD (peripheral artery disease) ([place]) 04/13/2011    Quay Burow, OTR/L 12/28/2015, 8:42 AM  [facility] [address] [place], [place], [address] Phone: [phone]   Fax:  [phone]  Name: [name] MRN: [id number] Date of Birth: [birth year]

## 2015-12-28 NOTE — Patient Instructions (Signed)
Walking Program:  Begin walking for exercise for 10 minutes, 2-3 times/day, 4-5 days/week.   Progress your walking program by adding 2-3 minutes to your routine each week, as tolerated. Goal is to progress to doing 2 walks of 25-30 minutes each.   Be sure to wear good walking shoes, walk in a safe environment and only progress to your tolerance. Use your cane/rail's when walking inside.  If walking OUTSIDE, use cane or rollator (depending on your energy that day), have someone with you until your balance improves.

## 2015-12-29 NOTE — Therapy (Signed)
[facility] [address] [place], [place], [address] Phone: [phone]   Fax:  [phone]  Physical Therapy Treatment  Patient Details  Name: [name] MRN: [id number] Date of Birth: Feb 13, 1941 Referring Provider: Dr. Erlinda Hong  Encounter Date: 12/28/2015      PT End of Session - 12/28/15 0852    Visit Number 3   Number of Visits 9   Date for PT Re-Evaluation 01/13/16   Authorization Type [employer]-no auth per Lisa's notes . G-CODE AND PROGRESS NOTE EVERY 10TH VISIT.    PT Start Time 0848   PT Stop Time 0930   PT Time Calculation (min) 42 min   Equipment Utilized During Treatment Gait belt   Activity Tolerance Patient tolerated treatment well   Behavior During Therapy WFL for tasks assessed/performed      Past Medical History:  Diagnosis Date  . AAA (abdominal aortic aneurysm) ([place])   . Aneurysm ([place])   . Arthritis    osteoarthritis  . Constipation   . Diabetes mellitus    Type 2, on Glimiperide  . GERD (gastroesophageal reflux disease)   . Glaucoma   . Hyperlipidemia   . Hypertension   . Kidney stones   . Myocardial infarction ([place])   . Peripheral vascular disease ([place])   . Pneumonia   . Stroke ([place]) 01/2015   with left side weakness   . TIA (transient ischemic attack)   . Urinary incontinence     Past Surgical History:  Procedure Laterality Date  . ABDOMINAL AORTAGRAM N/A 03/13/2014   Procedure: ABDOMINAL Maxcine Ham;  Surgeon: Elam Dutch, MD;  Location: [facility] CATH LAB;  Service: Cardiovascular;  Laterality: N/A;  . ABDOMINAL AORTIC ANEURYSM REPAIR    . ABDOMINAL AORTIC ANEURYSM REPAIR  ?2012  . ABDOMINAL HYSTERECTOMY     partial  . BACK SURGERY    . CARDIAC CATHETERIZATION    . CORONARY ANGIOPLASTY  03/2015  . EYE SURGERY Right    laser surgery for blood behind eye, loss of sight  . FEMORAL-POPLITEAL BYPASS GRAFT Left 07/06/2014   Procedure: BYPASS GRAFT FEMORAL-POPLITEAL ARTERY;   Surgeon: Elam Dutch, MD;  Location: [facility];  Service: Vascular;  Laterality: Left;  . HERNIA REPAIR    . JOINT REPLACEMENT    . LEFT HEART CATHETERIZATION WITH CORONARY ANGIOGRAM N/A 04/12/2014   Procedure: LEFT HEART CATHETERIZATION WITH CORONARY ANGIOGRAM;  Surgeon: Clent Demark, MD;  Location: [facility] CATH LAB;  Service: Cardiovascular;  Laterality: N/A;  . PERCUTANEOUS CORONARY STENT INTERVENTION (PCI-S)  04/12/2014   Procedure: PERCUTANEOUS CORONARY STENT INTERVENTION (PCI-S);  Surgeon: Clent Demark, MD;  Location: [facility] CATH LAB;  Service: Cardiovascular;;  prox and mid RCA  . TOTAL HIP ARTHROPLASTY Left 05/18/2015  . TOTAL HIP ARTHROPLASTY Left 05/18/2015   Procedure: LEFT TOTAL HIP ARTHROPLASTY ANTERIOR APPROACH;  Surgeon: Mcarthur Rossetti, MD;  Location: [facility];  Service: Orthopedics;  Laterality: Left;  . TOTAL KNEE ARTHROPLASTY Left ~ 2003  . TUBAL LIGATION    . uterine tumor    . VENTRAL HERNIA REPAIR      There were no vitals filed for this visit.      Subjective Assessment - 12/28/15 0851    Subjective No falls or pain to report. Tired today.   Pertinent History history of HTN, hyperlipidemia, DM, MI, CAD and MI s/p PCI, s/p AAA repair, TIA, stroke with mild residual left hemiparesis and dysarthria (01/2015), PAD s/p left femoral-popliteal bypass surgery-07/06/2014, tobacco use,  DVT and arthritis, urinary incontinence   Patient Stated Goals Improve balance and walk without rollator, stand without using hands.   Currently in Pain? No/denies   Pain Score 0-No pain          [facility] Adult PT Treatment/Exercise - 12/28/15 0855      Transfers   Transfers Sit to Stand;Stand to Sit   Sit to Stand 5: Supervision;With upper extremity assist;From chair/3-in-1   Stand to Sit 5: Supervision;With upper extremity assist;To chair/3-in-1     Ambulation/Gait   Ambulation/Gait Yes   Ambulation/Gait Assistance 4: Min guard;4: Min assist   Ambulation/Gait Assistance Details cues  on postue, to increase step length and for heel strike with initial contact.    Ambulation Distance (Feet) 345 Feet   Assistive device None   Gait Pattern Step-through pattern;Decreased stride length;Decreased dorsiflexion - left   Ambulation Surface Level;Indoor     High Level Balance   High Level Balance Activities Marching forwards;Marching backwards;Side stepping  tandem gait, toe walking, heel walking, all  fwd/bwd   High Level Balance Comments red mats next to counter top: left UE support on counter as needed for balance, 3 laps each/each way with min guard to min assist for balance             PT Short Term Goals - 12/15/15 1228      PT SHORT TERM GOAL #1   Title same as LTGs           PT Long Term Goals - 12/28/15 1356      PT LONG TERM GOAL #1   Title Pt will be IND in HEP to improve strength and balance. Target date: 01/11/16   Status On-going     PT LONG TERM GOAL #2   Title Pt will improve BERG score to >/=45/56 to decr. falls risk. Target date: 01/11/16   Status Revised     PT LONG TERM GOAL #3   Title Pt will improve gait speed with LRAD to >/=2.88f/sec to safely amb. in the community. Target date: 01/11/16   Status On-going     PT LONG TERM GOAL #4   Title Pt will perform TUG with LRAD in </=13.5 sec. to decr. falls risk. Target date: 01/11/16   Status On-going     PT LONG TERM GOAL #5   Title Pt will verbalize CVA signs/sx's to reduce risk of additonal CVA. Target date: 01/11/16   Status On-going     PT LONG TERM GOAL #6   Title Pt will amb. 500' over even/uneven terrain at MOD I level (incr. time) no AD, to improve functional mobilitly. Target date: 01/11/16   Status On-going            Plan - 12/28/15 01540   Clinical Impression Statement Today's session continued to address high level balance and gait without AD with no significant issues reported. Pt is making steady progress toward goals.   Rehab Potential Good   Clinical Impairments  Affecting Rehab Potential co-morbidities   PT Frequency 2x / week   PT Duration 4 weeks   PT Treatment/Interventions ADLs/Self Care Home Management;DME Instruction;Gait training;Stair training;Biofeedback;Canalith Repostioning;Vestibular;Manual techniques;Functional mobility training;Therapeutic activities;Therapeutic exercise;Electrical Stimulation;Balance training;Neuromuscular re-education;Cognitive remediation;Patient/family education;Orthotic Fit/Training;Passive range of motion   PT Next Visit Plan continue to work on gait, stengthening and balance toward LTGs. advance HEP as needed.   Consulted and Agree with Plan of Care Patient      Patient will benefit from skilled therapeutic intervention in order  to improve the following deficits and impairments:  Abnormal gait, Decreased strength, Decreased knowledge of use of DME, Decreased mobility, Decreased balance, Impaired sensation, Decreased cognition, Decreased endurance  Visit Diagnosis: Hemiplegia and hemiparesis following cerebral infarction affecting left non-dominant side (HCC)  Other abnormalities of gait and mobility     Problem List Patient Active Problem List   Diagnosis Date Noted  . Osteoarthritis of left hip 05/18/2015  . Status post total replacement of left hip 05/18/2015  . Coronary artery disease involving native coronary artery of native heart with angina pectoris ([place]) 04/16/2015  . History of stroke 04/16/2015  . Type 2 diabetes mellitus with circulatory disorder ([place]) 04/16/2015  . Cerebrovascular 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 artery ([place]) 04/16/2015  . HLD (hyperlipidemia)   . Acute CVA (cerebrovascular accident) ([place]) 02/19/2015  . Transient ischemic attack (TIA) 02/18/2015  . Stroke ([place])   . Cerebral infarction due to thrombosis of right middle cerebral artery ([place])   . Acute myocardial infarction of inferolateral wall (HCC) 04/12/2014  . Numbness 03/18/2014  . TIA (transient  ischemic attack) 03/18/2014  . DM (diabetes mellitus) type II uncontrolled, periph vascular disorder ([place]) 03/18/2014  . Hyperlipidemia   . Hypertension   . PVD (peripheral vascular disease) ([place]) 03/05/2014  . Transient cerebral ischemia 08/01/2012  . Atherosclerosis of native arteries of extremity with intermittent claudication ([place]) 10/19/2011  . Unspecified disorders of arteries and arterioles 10/19/2011  . PAD (peripheral artery disease) ([place]) 04/13/2011    Willow Ora, PTA, [facility] [address], [place], [place] [address] [phone] 12/29/15, 11:19 AM   Name: [name] MRN: [id number] Date of Birth: 15-Jul-1940

## 2015-12-30 ENCOUNTER — Ambulatory Visit: Payer: Commercial Managed Care - HMO

## 2015-12-30 ENCOUNTER — Ambulatory Visit: Payer: Commercial Managed Care - HMO | Admitting: Occupational Therapy

## 2015-12-30 DIAGNOSIS — M6281 Muscle weakness (generalized): Secondary | ICD-10-CM

## 2015-12-30 DIAGNOSIS — R2689 Other abnormalities of gait and mobility: Secondary | ICD-10-CM

## 2015-12-30 DIAGNOSIS — I69354 Hemiplegia and hemiparesis following cerebral infarction affecting left non-dominant side: Secondary | ICD-10-CM | POA: Diagnosis not present

## 2015-12-30 DIAGNOSIS — R278 Other lack of coordination: Secondary | ICD-10-CM | POA: Diagnosis not present

## 2015-12-30 NOTE — Therapy (Signed)
[facility] [address] [place], [place], [address] Phone: [phone]   Fax:  [phone]  Occupational Therapy Treatment  Patient Details  Name: [name] MRN: [id number] Date of Birth: Sep 23, 1940 Referring Provider: Dr. Rosalin Hawking  Encounter Date: 12/30/2015      OT End of Session - 12/30/15 1001    Visit Number 4   Number of Visits 9   Date for OT Re-Evaluation 01/14/16   Authorization Type [employer] MCR - G code needed   Authorization Time Period no auth or visit limit   Authorization - Visit Number 4   Authorization - Number of Visits 9   OT Start Time 0935   OT Stop Time 1005   OT Time Calculation (min) 30 min   Activity Tolerance Patient tolerated treatment well      Past Medical History:  Diagnosis Date  . AAA (abdominal aortic aneurysm) ([place])   . Aneurysm ([place])   . Arthritis    osteoarthritis  . Constipation   . Diabetes mellitus    Type 2, on Glimiperide  . GERD (gastroesophageal reflux disease)   . Glaucoma   . Hyperlipidemia   . Hypertension   . Kidney stones   . Myocardial infarction ([place])   . Peripheral vascular disease ([place])   . Pneumonia   . Stroke ([place]) 01/2015   with left side weakness   . TIA (transient ischemic attack)   . Urinary incontinence     Past Surgical History:  Procedure Laterality Date  . ABDOMINAL AORTAGRAM N/A 03/13/2014   Procedure: ABDOMINAL Maxcine Ham;  Surgeon: Elam Dutch, MD;  Location: [facility] CATH LAB;  Service: Cardiovascular;  Laterality: N/A;  . ABDOMINAL AORTIC ANEURYSM REPAIR    . ABDOMINAL AORTIC ANEURYSM REPAIR  ?2012  . ABDOMINAL HYSTERECTOMY     partial  . BACK SURGERY    . CARDIAC CATHETERIZATION    . CORONARY ANGIOPLASTY  03/2015  . EYE SURGERY Right    laser surgery for blood behind eye, loss of sight  . FEMORAL-POPLITEAL BYPASS GRAFT Left 07/06/2014   Procedure: BYPASS GRAFT FEMORAL-POPLITEAL ARTERY;  Surgeon: Elam Dutch, MD;   Location: [facility];  Service: Vascular;  Laterality: Left;  . HERNIA REPAIR    . JOINT REPLACEMENT    . LEFT HEART CATHETERIZATION WITH CORONARY ANGIOGRAM N/A 04/12/2014   Procedure: LEFT HEART CATHETERIZATION WITH CORONARY ANGIOGRAM;  Surgeon: Clent Demark, MD;  Location: [facility] CATH LAB;  Service: Cardiovascular;  Laterality: N/A;  . PERCUTANEOUS CORONARY STENT INTERVENTION (PCI-S)  04/12/2014   Procedure: PERCUTANEOUS CORONARY STENT INTERVENTION (PCI-S);  Surgeon: Clent Demark, MD;  Location: [facility] CATH LAB;  Service: Cardiovascular;;  prox and mid RCA  . TOTAL HIP ARTHROPLASTY Left 05/18/2015  . TOTAL HIP ARTHROPLASTY Left 05/18/2015   Procedure: LEFT TOTAL HIP ARTHROPLASTY ANTERIOR APPROACH;  Surgeon: Mcarthur Rossetti, MD;  Location: [facility];  Service: Orthopedics;  Laterality: Left;  . TOTAL KNEE ARTHROPLASTY Left ~ 2003  . TUBAL LIGATION    . uterine tumor    . VENTRAL HERNIA REPAIR      There were no vitals filed for this visit.      Subjective Assessment - 12/30/15 0937    Subjective  I didn't get a good nights sleep   Pertinent History CVA 01/2015, Lt THR 05/18/15, MI 2016, Rt eye surgery 2017, DM, HTN   Patient Stated Goals To get my Lt arm stronger so I can style my hair  Currently in Pain? No/denies  in UE's                      OT Treatments/Exercises (OP) - 12/30/15 0001      Shoulder Exercises: ROM/Strengthening   Other ROM/Strengthening Exercises Pt issued HEP for LUE - Pt return demo of each but required mod v.c's and demo cues to perform correctly. Pt very fatigued today and demo decreased endurance. Pt also reports she did not get a good night's sleep last night and wished to leave therapy a little early today.                 OT Education - 12/30/15 1000    Education provided Yes   Education Details UE HEP    Person(s) Educated Patient   Methods Explanation;Demonstration;Verbal cues;Handout   Comprehension Verbalized  understanding;Returned demonstration;Verbal cues required;Need further instruction             OT Long Term Goals - 12/28/15 0840      OT LONG TERM GOAL #1   Title Independent with HEP for bilateral UE strength and Lt grip strength (all LTG's due 01/14/16)    Time 4   Period Weeks   Status On-going     OT LONG TERM GOAL #2   Title Independent with HEP for Lt hand coordination   Time 4   Period Weeks   Status On-going     OT LONG TERM GOAL #3   Title Improve grip strength Lt hand to 32 lbs or greater to assist with opening jars/containers   Baseline eval = 25 lbs (Rt = 50)   Time 4   Period Weeks   Status On-going     OT LONG TERM GOAL #4   Title Improve coordination as evidenced by performing 9 hole peg test in 28 sec. or less   Baseline eval = 32.16 sec   Time 4   Period Weeks   Status On-going     OT LONG TERM GOAL #5   Title Pt to verbalize understanding with potential DME to make tub transfers/bathing safer   Time 4   Period Weeks   Status On-going     OT LONG TERM GOAL #6   Title Pt to report greater ease with styling/grooming hair w/ A/E prn   Time 4   Period Weeks   Status On-going               Plan - 12/30/15 1008    Clinical Impression Statement Pt very fatigued today. Pt required mod cueing for HEP and will need re-inforcement   Rehab Potential Fair   Clinical Impairments Affecting Rehab Potential time since CVA, Co-morbidities   OT Frequency 2x / week   OT Duration 4 weeks   OT Treatment/Interventions Self-care/ADL training;Functional Mobility Training;Patient/family education;Neuromuscular education;Manual Therapy;Therapeutic exercises;Therapeutic activities;DME and/or AE instruction;Energy conservation;Electrical Stimulation;Passive range of motion;Visual/perceptual remediation/compensation;Moist Heat   Plan review LUE HEP, educate pt in tub transfer bench and simulate if possible   Consulted and Agree with Plan of Care Patient       Patient will benefit from skilled therapeutic intervention in order to improve the following deficits and impairments:  Decreased coordination, Decreased range of motion, Decreased endurance, Decreased safety awareness, Impaired sensation, Impaired UE functional use, Decreased strength, Decreased mobility, Impaired vision/preception, Decreased knowledge of use of DME, Decreased activity tolerance, Decreased balance  Visit Diagnosis: Hemiplegia and hemiparesis following cerebral infarction affecting left non-dominant side (HCC)  Muscle weakness (  generalized)    Problem List Patient Active Problem List   Diagnosis Date Noted  . Osteoarthritis of left hip 05/18/2015  . Status post total replacement of left hip 05/18/2015  . Coronary artery disease involving native coronary artery of native heart with angina pectoris ([place]) 04/16/2015  . History of stroke 04/16/2015  . Type 2 diabetes mellitus with circulatory disorder ([place]) 04/16/2015  . Cerebrovascular 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 artery ([place]) 04/16/2015  . HLD (hyperlipidemia)   . Acute CVA (cerebrovascular accident) ([place]) 02/19/2015  . Transient ischemic attack (TIA) 02/18/2015  . Stroke ([place])   . 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 Acute myocardial infarction of inferolateral wall (HCC) 04/12/2014  . Numbness 03/18/2014  . TIA (transient ischemic attack) 03/18/2014  . DM (diabetes mellitus) type II uncontrolled, periph vascular disorder ([place]) 03/18/2014  . Hyperlipidemia   . Hypertension   . PVD (peripheral vascular disease) ([place]) 03/05/2014  . Transient cerebral ischemia 08/01/2012  . Atherosclerosis of native arteries of extremity with intermittent claudication ([place]) 10/19/2011  . Unspecified disorders of arteries and arterioles 10/19/2011  . PAD (peripheral artery disease) ([place]) 04/13/2011    Carey Bullocks, OTR/L 12/30/2015, 10:10 AM  [facility] [address] [place], [place], [address] Phone: [phone]   Fax:  [phone]  Name: [name] MRN: [id number] Date of Birth: Oct 12, 1940

## 2015-12-30 NOTE — Therapy (Signed)
[facility] [address] [place], [place], [address] Phone: [phone]   Fax:  [phone]  Physical Therapy Treatment  Patient Details  Name: [name] MRN: [id number] Date of Birth: December 25, 1940 Referring Provider: Dr. Erlinda Hong  Encounter Date: 12/30/2015      PT End of Session - 12/30/15 1324    Visit Number 4   Number of Visits 9   Date for PT Re-Evaluation 01/13/16   Authorization Type [employer]-no auth per Lisa's notes . G-CODE AND PROGRESS NOTE EVERY 10TH VISIT.    PT Start Time [phone]  pt late   PT Stop Time 0927  pt had appt with OT   PT Time Calculation (min) 32 min   Equipment Utilized During Treatment Gait belt   Activity Tolerance Patient tolerated treatment well   Behavior During Therapy WFL for tasks assessed/performed      Past Medical History:  Diagnosis Date  . AAA (abdominal aortic aneurysm) ([place])   . Aneurysm ([place])   . Arthritis    osteoarthritis  . Constipation   . Diabetes mellitus    Type 2, on Glimiperide  . GERD (gastroesophageal reflux disease)   . Glaucoma   . Hyperlipidemia   . Hypertension   . Kidney stones   . Myocardial infarction ([place])   . Peripheral vascular disease ([place])   . Pneumonia   . Stroke ([place]) 01/2015   with left side weakness   . TIA (transient ischemic attack)   . Urinary incontinence     Past Surgical History:  Procedure Laterality Date  . ABDOMINAL AORTAGRAM N/A 03/13/2014   Procedure: ABDOMINAL Maxcine Ham;  Surgeon: Elam Dutch, MD;  Location: [facility] CATH LAB;  Service: Cardiovascular;  Laterality: N/A;  . ABDOMINAL AORTIC ANEURYSM REPAIR    . ABDOMINAL AORTIC ANEURYSM REPAIR  ?2012  . ABDOMINAL HYSTERECTOMY     partial  . BACK SURGERY    . CARDIAC CATHETERIZATION    . CORONARY ANGIOPLASTY  03/2015  . EYE SURGERY Right    laser surgery for blood behind eye, loss of sight  . FEMORAL-POPLITEAL BYPASS GRAFT Left 07/06/2014   Procedure: BYPASS GRAFT  FEMORAL-POPLITEAL ARTERY;  Surgeon: Elam Dutch, MD;  Location: [facility];  Service: Vascular;  Laterality: Left;  . HERNIA REPAIR    . JOINT REPLACEMENT    . LEFT HEART CATHETERIZATION WITH CORONARY ANGIOGRAM N/A 04/12/2014   Procedure: LEFT HEART CATHETERIZATION WITH CORONARY ANGIOGRAM;  Surgeon: Clent Demark, MD;  Location: [facility] CATH LAB;  Service: Cardiovascular;  Laterality: N/A;  . PERCUTANEOUS CORONARY STENT INTERVENTION (PCI-S)  04/12/2014   Procedure: PERCUTANEOUS CORONARY STENT INTERVENTION (PCI-S);  Surgeon: Clent Demark, MD;  Location: [facility] CATH LAB;  Service: Cardiovascular;;  prox and mid RCA  . TOTAL HIP ARTHROPLASTY Left 05/18/2015  . TOTAL HIP ARTHROPLASTY Left 05/18/2015   Procedure: LEFT TOTAL HIP ARTHROPLASTY ANTERIOR APPROACH;  Surgeon: Mcarthur Rossetti, MD;  Location: [facility];  Service: Orthopedics;  Laterality: Left;  . TOTAL KNEE ARTHROPLASTY Left ~ 2003  . TUBAL LIGATION    . uterine tumor    . VENTRAL HERNIA REPAIR      There were no vitals filed for this visit.      Subjective Assessment - 12/30/15 0857    Subjective Pt late to appt today, as she reports she's having a hard time walking today. She spent a lot of time driving and walking in/outside to pay bills yesterday. She feels legs are "tangling up"  when walking.   Pertinent History history of HTN, hyperlipidemia, DM, MI, CAD and MI s/p PCI, s/p AAA repair, TIA, stroke with mild residual left hemiparesis and dysarthria (01/2015), PAD s/p left femoral-popliteal bypass surgery-07/06/2014, tobacco use, DVT and arthritis, urinary incontinence   Patient Stated Goals Improve balance and walk without rollator, stand without using hands.   Currently in Pain? Yes   Pain Score 6    Pain Location Hip   Pain Orientation Right   Pain Descriptors / Indicators Sharp   Pain Type Chronic pain   Pain Onset More than a month ago   Pain Frequency Intermittent   Aggravating Factors  walking   Pain Relieving Factors  sitting down                         [facility] Adult PT Treatment/Exercise - 12/30/15 0859      Ambulation/Gait   Ambulation/Gait Yes   Ambulation/Gait Assistance 4: Min guard;5: Supervision   Ambulation/Gait Assistance Details Amb. with SPC 2/2 R hip pain. Cues to improve stide length, look straight ahead. Pt performed head turns and nods.   Ambulation Distance (Feet) 240 Feet  and 100'   Assistive device Straight cane   Gait Pattern Step-through pattern;Decreased stride length;Decreased dorsiflexion - left   Ambulation Surface Level;Indoor     Exercises   Exercises Knee/Hip     Knee/Hip Exercises: Stretches   Active Hamstring Stretch Both;3 reps;30 seconds   Active Hamstring Stretch Limitations Seated: Cues to extend B knees and bend at hips while keeping back straight.   Piriformis Stretch Both;3 reps;30 seconds   Piriformis Stretch Limitations Supine: cues for technique.  Incr. Time as pt required frequent cues for technique.   Other Knee/Hip Stretches Pt reported R hip pain decr. to 2/10 after stretches.                PT Education - 12/30/15 1323    Education provided Yes   Education Details PT provided pt with stretching HEP, to decr. pain during amb.   Person(s) Educated Patient   Methods Explanation;Demonstration;Tactile cues;Verbal cues;Handout   Comprehension Returned demonstration;Verbalized understanding          PT Short Term Goals - 12/15/15 1228      PT SHORT TERM GOAL #1   Title same as LTGs           PT Long Term Goals - 12/28/15 1356      PT LONG TERM GOAL #1   Title Pt will be IND in HEP to improve strength and balance. Target date: 01/11/16   Status On-going     PT LONG TERM GOAL #2   Title Pt will improve BERG score to >/=45/56 to decr. falls risk. Target date: 01/11/16   Status Revised     PT LONG TERM GOAL #3   Title Pt will improve gait speed with LRAD to >/=2.29f/sec to safely amb. in the community. Target date:  01/11/16   Status On-going     PT LONG TERM GOAL #4   Title Pt will perform TUG with LRAD in </=13.5 sec. to decr. falls risk. Target date: 01/11/16   Status On-going     PT LONG TERM GOAL #5   Title Pt will verbalize CVA signs/sx's to reduce risk of additonal CVA. Target date: 01/11/16   Status On-going     PT LONG TERM GOAL #6   Title Pt will amb. 500' over even/uneven terrain at MOD I  level (incr. time) no AD, to improve functional mobilitly. Target date: 01/11/16   Status On-going               Plan - 12/30/15 1324    Clinical Impression Statement Pt limited by R hip pain today, which decr. after pt performed BLE stretches (pt reported pain decr. from 6/10 to 2/10). Pt required cues to decr. narrow BOS during amb. which caused incr. postural sway and pt was able to correct once cued. Continue with POC.    Rehab Potential Good   Clinical Impairments Affecting Rehab Potential co-morbidities   PT Frequency 2x / week   PT Duration 4 weeks   PT Treatment/Interventions ADLs/Self Care Home Management;DME Instruction;Gait training;Stair training;Biofeedback;Canalith Repostioning;Vestibular;Manual techniques;Functional mobility training;Therapeutic activities;Therapeutic exercise;Electrical Stimulation;Balance training;Neuromuscular re-education;Cognitive remediation;Patient/family education;Orthotic Fit/Training;Passive range of motion   PT Next Visit Plan continue to work on gait, stengthening (leg press) and balance toward LTGs. advance HEP as needed.   Consulted and Agree with Plan of Care Patient      Patient will benefit from skilled therapeutic intervention in order to improve the following deficits and impairments:  Abnormal gait, Decreased strength, Decreased knowledge of use of DME, Decreased mobility, Decreased balance, Impaired sensation, Decreased cognition, Decreased endurance  Visit Diagnosis: Muscle weakness (generalized)  Other abnormalities of gait and  mobility     Problem List Patient Active Problem List   Diagnosis Date Noted  . Osteoarthritis of left hip 05/18/2015  . Status post total replacement of left hip 05/18/2015  . Coronary artery disease involving native coronary artery of native heart with angina pectoris ([place]) 04/16/2015  . History of stroke 04/16/2015  . Type 2 diabetes mellitus with circulatory disorder ([place]) 04/16/2015  . Cerebrovascular 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 artery ([place]) 04/16/2015  . HLD (hyperlipidemia)   . Acute CVA (cerebrovascular accident) ([place]) 02/19/2015  . Transient ischemic attack (TIA) 02/18/2015  . Stroke ([place])   . Cerebral infarction due to thrombosis of right middle cerebral artery ([place])   . Acute myocardial infarction of inferolateral wall (HCC) 04/12/2014  . Numbness 03/18/2014  . TIA (transient ischemic attack) 03/18/2014  . DM (diabetes mellitus) type II uncontrolled, periph vascular disorder ([place]) 03/18/2014  . Hyperlipidemia   . Hypertension   . PVD (peripheral vascular disease) ([place]) 03/05/2014  . Transient cerebral ischemia 08/01/2012  . Atherosclerosis of native arteries of extremity with intermittent claudication ([place]) 10/19/2011  . Unspecified disorders of arteries and arterioles 10/19/2011  . PAD (peripheral artery disease) ([place]) 04/13/2011    Nishtha Raider L 12/30/2015, 1:31 PM  [facility] [address] [place], [place], [address] Phone: [phone]   Fax:  [phone]  Name: [name] MRN: [id number] Date of Birth: Apr 02, 1941   Geoffry Paradise, PT,DPT 12/30/15 1:32 PM Phone: [phone] Fax: [phone]

## 2015-12-30 NOTE — Patient Instructions (Signed)
HIP: Hamstrings - Short Sitting    Rest leg on floor. Keep knee straight. Lift chest and keep back straight and bend at hips with chest towards legs. Hold _30__ seconds. _3__ reps per set, __2-3_ sets per day, _7__ days per week  Copyright  VHI. All rights reserved.    Piriformis (Supine)    Cross legs, keep bottom foot on floor, right on top and gently push right knee away from body. Repeat with left leg on top of right knee. Hold __30__ seconds. Repeat __3__ times per set per leg. Do __3__ sets per session. Do __2-3__ sessions per day. Progress to: Gently pull other knee toward chest until stretch is felt in buttock/hip of top leg.  http://orth.exer.us/677   Copyright  VHI. All rights reserved.

## 2015-12-30 NOTE — Patient Instructions (Signed)
SHOULDER: Flexion - Sitting    Hold cane with both hands. Raise arms up EVENLY. Keep elbows STRAIGHT. Hold _2__ seconds. Use _0-1__ lb weight on cane. _10__ reps per set, 2 days per day   Abduction (Eccentric) - Active (Cane)    SEATED: Lift cane out to left side with affected arm. Avoid hiking shoulder. Keep palm relaxed. Slowly lower affected arm for 3-5 seconds. _10__ reps per set, _2__ sets per day. Add _1__ lbs when you achieve 15___ repetitions.  Horizontal Abduction (Resistive Band)    With arms at shoulder level, keep elbows straight. Using other arm as anchor, pull involved left arm outward. Hold __2__ seconds. Repeat __10__ times. Do _2___ sessions per day.  Copyright  VHI. All rights reserved.

## 2016-01-04 ENCOUNTER — Ambulatory Visit: Payer: Commercial Managed Care - HMO

## 2016-01-04 ENCOUNTER — Ambulatory Visit: Payer: Commercial Managed Care - HMO | Admitting: Occupational Therapy

## 2016-01-06 ENCOUNTER — Ambulatory Visit: Payer: Commercial Managed Care - HMO

## 2016-01-06 ENCOUNTER — Encounter: Payer: Commercial Managed Care - HMO | Admitting: Occupational Therapy

## 2016-01-06 DIAGNOSIS — E785 Hyperlipidemia, unspecified: Secondary | ICD-10-CM | POA: Diagnosis not present

## 2016-01-06 DIAGNOSIS — E559 Vitamin D deficiency, unspecified: Secondary | ICD-10-CM | POA: Diagnosis not present

## 2016-01-11 ENCOUNTER — Ambulatory Visit: Payer: Commercial Managed Care - HMO

## 2016-01-11 ENCOUNTER — Ambulatory Visit: Payer: Commercial Managed Care - HMO | Admitting: Occupational Therapy

## 2016-01-13 ENCOUNTER — Telehealth: Payer: Self-pay

## 2016-01-13 ENCOUNTER — Ambulatory Visit: Payer: Commercial Managed Care - HMO | Admitting: Occupational Therapy

## 2016-01-13 ENCOUNTER — Ambulatory Visit: Payer: Commercial Managed Care - HMO

## 2016-01-13 NOTE — Telephone Encounter (Signed)
PT called pt regarding no-shows to last two therapy appt's. Pt did not answer and there is not an answering machine to leave a msg. If pt does not call back in one week, she will be discharged.  Geoffry Paradise, PT,DPT 01/13/16 9:22 AM Phone: [phone] Fax: [phone]

## 2016-03-09 ENCOUNTER — Encounter (HOSPITAL_COMMUNITY): Payer: Self-pay | Admitting: *Deleted

## 2016-03-09 DIAGNOSIS — Z955 Presence of coronary angioplasty implant and graft: Secondary | ICD-10-CM | POA: Insufficient documentation

## 2016-03-09 DIAGNOSIS — Z96652 Presence of left artificial knee joint: Secondary | ICD-10-CM | POA: Insufficient documentation

## 2016-03-09 DIAGNOSIS — R918 Other nonspecific abnormal finding of lung field: Secondary | ICD-10-CM | POA: Diagnosis not present

## 2016-03-09 DIAGNOSIS — F17210 Nicotine dependence, cigarettes, uncomplicated: Secondary | ICD-10-CM | POA: Diagnosis not present

## 2016-03-09 DIAGNOSIS — I252 Old myocardial infarction: Secondary | ICD-10-CM | POA: Diagnosis not present

## 2016-03-09 DIAGNOSIS — Z5321 Procedure and treatment not carried out due to patient leaving prior to being seen by health care provider: Secondary | ICD-10-CM | POA: Insufficient documentation

## 2016-03-09 DIAGNOSIS — Z8673 Personal history of transient ischemic attack (TIA), and cerebral infarction without residual deficits: Secondary | ICD-10-CM | POA: Insufficient documentation

## 2016-03-09 DIAGNOSIS — E1165 Type 2 diabetes mellitus with hyperglycemia: Secondary | ICD-10-CM | POA: Diagnosis not present

## 2016-03-09 DIAGNOSIS — I10 Essential (primary) hypertension: Secondary | ICD-10-CM | POA: Diagnosis not present

## 2016-03-09 DIAGNOSIS — R079 Chest pain, unspecified: Secondary | ICD-10-CM | POA: Insufficient documentation

## 2016-03-09 DIAGNOSIS — R51 Headache: Secondary | ICD-10-CM | POA: Insufficient documentation

## 2016-03-09 DIAGNOSIS — Z96642 Presence of left artificial hip joint: Secondary | ICD-10-CM | POA: Diagnosis not present

## 2016-03-09 LAB — CBG MONITORING, ED: Glucose-Capillary: 324 mg/dL — ABNORMAL HIGH (ref 65–99)

## 2016-03-09 NOTE — ED Triage Notes (Signed)
Pt reports headache, high blood sugar and intermittent left sided chest pain starting around 2130. Pt reports blood sugar of 456 at home checked at 2245

## 2016-03-10 ENCOUNTER — Emergency Department (HOSPITAL_COMMUNITY)
Admission: EM | Admit: 2016-03-10 | Discharge: 2016-03-10 | Disposition: A | Discharge status: Home / Self Care | Payer: Commercial Managed Care - HMO | Attending: Emergency Medicine | Admitting: Emergency Medicine

## 2016-03-10 ENCOUNTER — Emergency Department (HOSPITAL_COMMUNITY): Payer: Commercial Managed Care - HMO

## 2016-03-10 DIAGNOSIS — R918 Other nonspecific abnormal finding of lung field: Secondary | ICD-10-CM | POA: Diagnosis not present

## 2016-03-10 LAB — BASIC METABOLIC PANEL
Anion gap: 11 (ref 5–15)
BUN: 14 mg/dL (ref 6–20)
CHLORIDE: 103 mmol/L (ref 101–111)
CO2: 21 mmol/L — ABNORMAL LOW (ref 22–32)
Calcium: 9.8 mg/dL (ref 8.9–10.3)
Creatinine, Ser: 1.13 mg/dL — ABNORMAL HIGH (ref 0.44–1.00)
GFR calc Af Amer: 53 mL/min — ABNORMAL LOW (ref >60)
GFR calc non Af Amer: 46 mL/min — ABNORMAL LOW (ref >60)
Glucose, Bld: 321 mg/dL — ABNORMAL HIGH (ref 65–99)
POTASSIUM: 4 mmol/L (ref 3.5–5.1)
SODIUM: 135 mmol/L (ref 135–145)

## 2016-03-10 LAB — CBC
HEMATOCRIT: 36.3 % (ref 36.0–46.0)
HEMOGLOBIN: 12.1 g/dL (ref 12.0–15.0)
MCH: 28.9 pg (ref 26.0–34.0)
MCHC: 33.3 g/dL (ref 30.0–36.0)
MCV: 86.6 fL (ref 78.0–100.0)
Platelets: 220 10*3/uL (ref 150–400)
RBC: 4.19 MIL/uL (ref 3.87–5.11)
RDW: 14.7 % (ref 11.5–15.5)
WBC: 8.5 10*3/uL (ref 4.0–10.5)

## 2016-03-10 LAB — I-STAT TROPONIN, ED: Troponin i, poc: 0.01 ng/mL (ref 0.00–0.08)

## 2016-03-10 NOTE — ED Notes (Signed)
No answer for room x2

## 2016-03-10 NOTE — ED Notes (Signed)
Called pt.x3 for a exam room no answer

## 2016-03-13 ENCOUNTER — Other Ambulatory Visit: Payer: Self-pay | Admitting: Cardiology

## 2016-03-13 DIAGNOSIS — R079 Chest pain, unspecified: Secondary | ICD-10-CM

## 2016-03-13 DIAGNOSIS — I2510 Atherosclerotic heart disease of native coronary artery without angina pectoris: Secondary | ICD-10-CM | POA: Diagnosis not present

## 2016-03-13 DIAGNOSIS — F17290 Nicotine dependence, other tobacco product, uncomplicated: Secondary | ICD-10-CM | POA: Diagnosis not present

## 2016-03-13 DIAGNOSIS — I639 Cerebral infarction, unspecified: Secondary | ICD-10-CM | POA: Diagnosis not present

## 2016-03-13 DIAGNOSIS — I10 Essential (primary) hypertension: Secondary | ICD-10-CM | POA: Diagnosis not present

## 2016-03-13 DIAGNOSIS — I252 Old myocardial infarction: Secondary | ICD-10-CM | POA: Diagnosis not present

## 2016-03-13 DIAGNOSIS — R0609 Other forms of dyspnea: Secondary | ICD-10-CM | POA: Diagnosis not present

## 2016-03-13 DIAGNOSIS — E785 Hyperlipidemia, unspecified: Secondary | ICD-10-CM | POA: Diagnosis not present

## 2016-03-13 DIAGNOSIS — I739 Peripheral vascular disease, unspecified: Secondary | ICD-10-CM | POA: Diagnosis not present

## 2016-03-16 NOTE — Therapy (Signed)
[facility] [address] [place], [place], [address] Phone: [phone]   Fax:  [phone]  Patient Details  Name: [name] MRN: [id number] Date of Birth: [birth year] Referring Provider:  No ref. provider found  Encounter Date: 22-Mar-2016   PHYSICAL THERAPY DISCHARGE SUMMARY  Visits from Start of Care: 4  Current functional level related to goals / functional outcomes:     PT Long Term Goals - 12/28/15 1356      PT LONG TERM GOAL #1   Title Pt will be IND in HEP to improve strength and balance. Target date: 01/11/16   Status On-going     PT LONG TERM GOAL #2   Title Pt will improve BERG score to >/=45/56 to decr. falls risk. Target date: 01/11/16   Status Revised     PT LONG TERM GOAL #3   Title Pt will improve gait speed with LRAD to >/=2.69f/sec to safely amb. in the community. Target date: 01/11/16   Status On-going     PT LONG TERM GOAL #4   Title Pt will perform TUG with LRAD in </=13.5 sec. to decr. falls risk. Target date: 01/11/16   Status On-going     PT LONG TERM GOAL #5   Title Pt will verbalize CVA signs/sx's to reduce risk of additonal CVA. Target date: 01/11/16   Status On-going     PT LONG TERM GOAL #6   Title Pt will amb. 500' over even/uneven terrain at MOD I level (incr. time) no AD, to improve functional mobilitly. Target date: 01/11/16   Status On-going        Remaining deficits: Unknown, as pt did not return since last visit.   Education / Equipment: HEP  Plan: Patient agrees to discharge.  Patient goals were not met. Patient is being discharged due to not returning since the last visit.  ?????           G-Codes - [id number]    Functional Assessment Tool Used Same as baseline, as pt did not return. Gait speed with rollator: 1.950fsec; TUG with rollatr: 26.8sec.   Functional Limitation Mobility: Walking and moving around   Mobility: Walking and Moving Around Goal  Status ([id number] least 40 percent but less than 60 percent impaired, limited or restricted   Mobility: Walking and Moving Around Discharge Status ([id number] least 40 percent but less than 60 percent impaired, limited or restricted      Mat Stuard L 1010/25/179:25 AM  [place]one: [phone] Fax:  [phone] JeGeoffry ParadisePT,DPT 102017-10-25:25 AM Phone: [phone]: [phone]

## 2016-03-22 ENCOUNTER — Encounter (HOSPITAL_COMMUNITY)
Admission: RE | Admit: 2016-03-22 | Discharge: 2016-03-22 | Disposition: A | Discharge status: Home / Self Care | Payer: Commercial Managed Care - HMO | Source: Ambulatory Visit | Attending: Cardiology | Admitting: Cardiology

## 2016-03-22 DIAGNOSIS — R079 Chest pain, unspecified: Secondary | ICD-10-CM | POA: Diagnosis not present

## 2016-03-22 DIAGNOSIS — I2510 Atherosclerotic heart disease of native coronary artery without angina pectoris: Secondary | ICD-10-CM | POA: Diagnosis not present

## 2016-03-22 DIAGNOSIS — I10 Essential (primary) hypertension: Secondary | ICD-10-CM | POA: Diagnosis not present

## 2016-03-22 DIAGNOSIS — E785 Hyperlipidemia, unspecified: Secondary | ICD-10-CM | POA: Diagnosis not present

## 2016-03-22 DIAGNOSIS — I739 Peripheral vascular disease, unspecified: Secondary | ICD-10-CM | POA: Diagnosis not present

## 2016-03-22 DIAGNOSIS — I639 Cerebral infarction, unspecified: Secondary | ICD-10-CM | POA: Diagnosis not present

## 2016-03-22 DIAGNOSIS — I252 Old myocardial infarction: Secondary | ICD-10-CM | POA: Diagnosis not present

## 2016-03-22 DIAGNOSIS — R0609 Other forms of dyspnea: Secondary | ICD-10-CM | POA: Diagnosis not present

## 2016-03-22 MED ORDER — TECHNETIUM TC 99M TETROFOSMIN IV KIT
10.0000 | PACK | Freq: Once | INTRAVENOUS | Status: AC | PRN
Start: 2016-03-22 — End: 2016-03-22
  Administered 2016-03-22: 10 via INTRAVENOUS

## 2016-03-22 MED ORDER — REGADENOSON 0.4 MG/5ML IV SOLN
INTRAVENOUS | Status: AC
  Filled 2016-03-22: qty 5

## 2016-03-22 MED ORDER — REGADENOSON 0.4 MG/5ML IV SOLN
0.4000 mg | Freq: Once | INTRAVENOUS | Status: AC
Start: 2016-03-22 — End: 2016-03-22
  Administered 2016-03-22: 0.4 mg via INTRAVENOUS

## 2016-03-22 MED ORDER — TECHNETIUM TC 99M TETROFOSMIN IV KIT
30.0000 | PACK | Freq: Once | INTRAVENOUS | Status: AC | PRN
  Administered 2016-03-22: 30 via INTRAVENOUS

## 2016-03-30 ENCOUNTER — Ambulatory Visit: Payer: Commercial Managed Care - HMO | Admitting: Neurology

## 2016-04-04 ENCOUNTER — Encounter: Payer: Self-pay | Admitting: Neurology

## 2016-04-07 ENCOUNTER — Encounter: Payer: Self-pay | Admitting: Vascular Surgery

## 2016-04-11 ENCOUNTER — Encounter (HOSPITAL_COMMUNITY): Payer: Commercial Managed Care - HMO

## 2016-04-11 ENCOUNTER — Inpatient Hospital Stay (HOSPITAL_COMMUNITY): Admission: RE | Admit: 2016-04-11 | Payer: Commercial Managed Care - HMO | Source: Ambulatory Visit

## 2016-04-13 ENCOUNTER — Encounter (HOSPITAL_COMMUNITY): Payer: Medicare Other

## 2016-04-13 ENCOUNTER — Ambulatory Visit: Payer: Commercial Managed Care - HMO | Admitting: Vascular Surgery

## 2016-04-13 ENCOUNTER — Ambulatory Visit: Payer: Medicare Other | Admitting: Family

## 2016-04-17 ENCOUNTER — Encounter: Payer: Self-pay | Admitting: Occupational Therapy

## 2016-04-17 DIAGNOSIS — I69354 Hemiplegia and hemiparesis following cerebral infarction affecting left non-dominant side: Secondary | ICD-10-CM

## 2016-04-17 NOTE — Therapy (Signed)
[facility] [address] [place], [place], [address] Phone: [phone]   Fax:  [phone]  Occupational Therapy Treatment  Patient Details  Name: [name] MRN: [id number] Date of Birth: January 11, 1941 Referring Provider: Dr. Rosalin Hawking  Encounter Date: 04/17/2016    Past Medical History:  Diagnosis Date  . AAA (abdominal aortic aneurysm) ([place])   . Aneurysm ([place])   . Arthritis    osteoarthritis  . Constipation   . Diabetes mellitus    Type 2, on Glimiperide  . GERD (gastroesophageal reflux disease)   . Glaucoma   . Hyperlipidemia   . Hypertension   . Kidney stones   . Myocardial infarction   . Peripheral vascular disease ([place])   . Pneumonia   . Stroke ([place]) 01/2015   with left side weakness   . TIA (transient ischemic attack)   . Urinary incontinence     Past Surgical History:  Procedure Laterality Date  . ABDOMINAL AORTAGRAM N/A 03/13/2014   Procedure: ABDOMINAL Maxcine Ham;  Surgeon: Elam Dutch, MD;  Location[facility] CATH LAB;  Service: Cardiovascular;  Laterality: N/A;  . ABDOMINAL AORTIC ANEURYSM REPAIR    . ABDOMINAL AORTIC ANEURYSM REPAIR  ?2012  . ABDOMINAL HYSTERECTOMY     partial  . BACK SURGERY    . CARDIAC CATHETERIZATION    . CORONARY ANGIOPLASTY  03/2015  . EYE SURGERY Right    laser surgery for blood behind eye, loss of sight  . FEMORAL-POPLITEAL BYPASS GRAFT Left 07/06/2014   Procedure: BYPASS GRAFT FEMORAL-POPLITEAL ARTERY;  Surgeon: Elam Dutch, MD;  Location: [facility];  Service: Vascular;  Laterality: Left;  . HERNIA REPAIR    . JOINT REPLACEMENT    . LEFT HEART CATHETERIZATION WITH CORONARY ANGIOGRAM N/A 04/12/2014   Procedure: LEFT HEART CATHETERIZATION WITH CORONARY ANGIOGRAM;  Surgeon: Clent Demark, MD;  Location: [facility] CATH LAB;  Service: Cardiovascular;  Laterality: N/A;  . PERCUTANEOUS CORONARY STENT INTERVENTION (PCI-S)  04/12/2014   Procedure: PERCUTANEOUS CORONARY STENT  INTERVENTION (PCI-S);  Surgeon: Clent Demark, MD;  Location: [facility] CATH LAB;  Service: Cardiovascular;;  prox and mid RCA  . TOTAL HIP ARTHROPLASTY Left 05/18/2015  . TOTAL HIP ARTHROPLASTY Left 05/18/2015   Procedure: LEFT TOTAL HIP ARTHROPLASTY ANTERIOR APPROACH;  Surgeon: Mcarthur Rossetti, MD;  Location: [facility];  Service: Orthopedics;  Laterality: Left;  . TOTAL KNEE ARTHROPLASTY Left ~ 2003  . TUBAL LIGATION    . uterine tumor    . VENTRAL HERNIA REPAIR      There were no vitals filed for this visit.                                 OT Long Term Goals - 12/28/15 0840      OT LONG TERM GOAL #1   Title Independent with HEP for bilateral UE strength and Lt grip strength (all LTG's due 01/14/16)    Time 4   Period Weeks   Status On-going     OT LONG TERM GOAL #2   Title Independent with HEP for Lt hand coordination   Time 4   Period Weeks   Status On-going     OT LONG TERM GOAL #3   Title Improve grip strength Lt hand to 32 lbs or greater to assist with opening jars/containers   Baseline eval = 25 lbs (Rt = 50)   Time 4   Period  Weeks   Status On-going     OT LONG TERM GOAL #4   Title Improve coordination as evidenced by performing 9 hole peg test in 28 sec. or less   Baseline eval = 32.16 sec   Time 4   Period Weeks   Status On-going     OT LONG TERM GOAL #5   Title Pt to verbalize understanding with potential DME to make tub transfers/bathing safer   Time 4   Period Weeks   Status On-going     OT LONG TERM GOAL #6   Title Pt to report greater ease with styling/grooming hair w/ A/E prn   Time 4   Period Weeks   Status On-going             Patient will benefit from skilled therapeutic intervention in order to improve the following deficits and impairments:     Visit Diagnosis: Hemiplegia and hemiparesis following cerebral infarction affecting left non-dominant side (HCC)      G-Codes - Apr 30, 2016 1526    Functional  Assessment Tool Used LUE: grip strength = 25 lbs, MMT 3+/5, 9 hole = 32.16 sec.    Functional Limitation Carrying, moving and handling objects   Carrying, Moving and Handling Objects Current Status [id number]) At least 40 percent but less than 60 percent impaired, limited or restricted   Carrying, Moving and Handling Objects Goal Status ([id number]) At least 40 percent but less than 60 percent impaired, limited or restricted   Carrying, Moving and Handling Objects Discharge Status [phone]) At least 40 percent but less than 60 percent impaired, limited or restricted      Problem List Patient Active Problem List   Diagnosis Date Noted  . Osteoarthritis of left hip 05/18/2015  . Status post total replacement of left hip 05/18/2015  . Coronary artery disease involving native coronary artery of native heart with angina pectoris ([place]) 04/16/2015  . History of stroke 04/16/2015  . Type 2 diabetes mellitus with circulatory disorder ([place]) 04/16/2015  . Cerebrovascular 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 artery ([place]) 04/16/2015  . HLD (hyperlipidemia)   . Acute CVA (cerebrovascular accident) ([place]) 02/19/2015  . Transient ischemic attack (TIA) 02/18/2015  . Stroke ([place])   . Cerebral infarction due to thrombosis of right middle cerebral artery ([place])   . Acute myocardial infarction of inferolateral wall (HCC) 04/12/2014  . Numbness 03/18/2014  . TIA (transient ischemic attack) 03/18/2014  . DM (diabetes mellitus) type II uncontrolled, periph vascular disorder ([place]) 03/18/2014  . Hyperlipidemia   . Hypertension   . PVD (peripheral vascular disease) ([place]) 03/05/2014  . Transient cerebral ischemia 08/01/2012  . Atherosclerosis of native arteries of extremity with intermittent claudication ([place]) 10/19/2011  . Unspecified disorders of arteries and arterioles 10/19/2011  . PAD (peripheral artery disease) ([place]) 04/13/2011   Pt attended eval and then only 2 treatment sessions - pt did not  return therefore no goals met.  Will discharge pt at this time.  Quay Burow, OTR/L 2016-04-30, 3:26 PM  [facility] [address] [place], [place], [address] Phone: [phone]   Fax:  [phone]  Name: [name] MRN: [id number] Date of Birth: 26-Mar-1941

## 2016-04-27 ENCOUNTER — Emergency Department (HOSPITAL_COMMUNITY)
Admission: EM | Admit: 2016-04-27 | Discharge: 2016-04-27 | Disposition: A | Discharge status: Home / Self Care | Payer: Commercial Managed Care - HMO | Attending: Emergency Medicine | Admitting: Emergency Medicine

## 2016-04-27 ENCOUNTER — Emergency Department (HOSPITAL_COMMUNITY): Payer: Commercial Managed Care - HMO

## 2016-04-27 ENCOUNTER — Encounter (HOSPITAL_COMMUNITY): Payer: Self-pay | Admitting: Emergency Medicine

## 2016-04-27 DIAGNOSIS — F17210 Nicotine dependence, cigarettes, uncomplicated: Secondary | ICD-10-CM | POA: Diagnosis not present

## 2016-04-27 DIAGNOSIS — Z79899 Other long term (current) drug therapy: Secondary | ICD-10-CM | POA: Diagnosis not present

## 2016-04-27 DIAGNOSIS — M19012 Primary osteoarthritis, left shoulder: Secondary | ICD-10-CM | POA: Diagnosis not present

## 2016-04-27 DIAGNOSIS — I252 Old myocardial infarction: Secondary | ICD-10-CM | POA: Diagnosis not present

## 2016-04-27 DIAGNOSIS — M25512 Pain in left shoulder: Secondary | ICD-10-CM | POA: Diagnosis not present

## 2016-04-27 DIAGNOSIS — I2510 Atherosclerotic heart disease of native coronary artery without angina pectoris: Secondary | ICD-10-CM | POA: Insufficient documentation

## 2016-04-27 DIAGNOSIS — E119 Type 2 diabetes mellitus without complications: Secondary | ICD-10-CM | POA: Insufficient documentation

## 2016-04-27 DIAGNOSIS — Z8673 Personal history of transient ischemic attack (TIA), and cerebral infarction without residual deficits: Secondary | ICD-10-CM | POA: Diagnosis not present

## 2016-04-27 DIAGNOSIS — R0789 Other chest pain: Secondary | ICD-10-CM | POA: Diagnosis not present

## 2016-04-27 DIAGNOSIS — Z7982 Long term (current) use of aspirin: Secondary | ICD-10-CM | POA: Diagnosis not present

## 2016-04-27 DIAGNOSIS — R079 Chest pain, unspecified: Secondary | ICD-10-CM | POA: Diagnosis not present

## 2016-04-27 LAB — D-DIMER, QUANTITATIVE: D-Dimer, Quant: 2.18 ug/mL-FEU — ABNORMAL HIGH (ref 0.00–0.50)

## 2016-04-27 LAB — CBC WITH DIFFERENTIAL/PLATELET
BASOS PCT: 0 %
Basophils Absolute: 0 10*3/uL (ref 0.0–0.1)
Eosinophils Absolute: 0.2 10*3/uL (ref 0.0–0.7)
Eosinophils Relative: 3 %
HEMATOCRIT: 37.2 % (ref 36.0–46.0)
HEMOGLOBIN: 12.7 g/dL (ref 12.0–15.0)
LYMPHS ABS: 2.5 10*3/uL (ref 0.7–4.0)
LYMPHS PCT: 31 %
MCH: 29.3 pg (ref 26.0–34.0)
MCHC: 34.1 g/dL (ref 30.0–36.0)
MCV: 85.9 fL (ref 78.0–100.0)
MONOS PCT: 7 %
Monocytes Absolute: 0.6 10*3/uL (ref 0.1–1.0)
NEUTROS ABS: 4.7 10*3/uL (ref 1.7–7.7)
NEUTROS PCT: 59 %
Platelets: 203 10*3/uL (ref 150–400)
RBC: 4.33 MIL/uL (ref 3.87–5.11)
RDW: 14.8 % (ref 11.5–15.5)
WBC: 8.1 10*3/uL (ref 4.0–10.5)

## 2016-04-27 LAB — BASIC METABOLIC PANEL
ANION GAP: 11 (ref 5–15)
BUN: 15 mg/dL (ref 6–20)
CALCIUM: 9.7 mg/dL (ref 8.9–10.3)
CHLORIDE: 103 mmol/L (ref 101–111)
CO2: 23 mmol/L (ref 22–32)
Creatinine, Ser: 1.12 mg/dL — ABNORMAL HIGH (ref 0.44–1.00)
GFR calc non Af Amer: 46 mL/min — ABNORMAL LOW (ref >60)
GFR, EST AFRICAN AMERICAN: 54 mL/min — AB (ref >60)
Glucose, Bld: 176 mg/dL — ABNORMAL HIGH (ref 65–99)
POTASSIUM: 3.7 mmol/L (ref 3.5–5.1)
Sodium: 137 mmol/L (ref 135–145)

## 2016-04-27 LAB — I-STAT TROPONIN, ED
Troponin i, poc: 0 ng/mL (ref 0.00–0.08)
Troponin i, poc: 0 ng/mL (ref 0.00–0.08)

## 2016-04-27 MED ORDER — IOPAMIDOL (ISOVUE-370) INJECTION 76%
INTRAVENOUS | Status: AC
  Administered 2016-04-27: 100 mL
  Filled 2016-04-27: qty 100
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
## 2016-04-27 NOTE — ED Notes (Signed)
EMT assisted pt to the BR, reports while pt is bending forward, she started to have mid cp.  Regenia Skeeter EDP made aware, EKG obtained

## 2016-04-27 NOTE — ED Notes (Signed)
Patient transported to X-ray 

## 2016-04-27 NOTE — ED Notes (Signed)
Patient transported to CT 
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
## 2016-04-27 NOTE — ED Notes (Signed)
Pt resting comfortably, reports R chest and R breast pain and L shoulder pain.  Pt reports cp started this am.  Pt in NAD.  Denies any SOB or lightheadedness at present

## 2016-05-01 DIAGNOSIS — I701 Atherosclerosis of renal artery: Secondary | ICD-10-CM | POA: Diagnosis not present

## 2016-05-01 DIAGNOSIS — I63311 Cerebral infarction due to thrombosis of right middle cerebral artery: Secondary | ICD-10-CM | POA: Diagnosis not present

## 2016-05-01 DIAGNOSIS — E11311 Type 2 diabetes mellitus with unspecified diabetic retinopathy with macular edema: Secondary | ICD-10-CM | POA: Diagnosis not present

## 2016-05-01 DIAGNOSIS — N182 Chronic kidney disease, stage 2 (mild): Secondary | ICD-10-CM | POA: Diagnosis not present

## 2016-05-01 DIAGNOSIS — J439 Emphysema, unspecified: Secondary | ICD-10-CM | POA: Diagnosis not present

## 2016-05-01 DIAGNOSIS — F17200 Nicotine dependence, unspecified, uncomplicated: Secondary | ICD-10-CM | POA: Diagnosis not present

## 2016-05-01 DIAGNOSIS — I714 Abdominal aortic aneurysm, without rupture: Secondary | ICD-10-CM | POA: Diagnosis not present

## 2016-05-01 DIAGNOSIS — Z23 Encounter for immunization: Secondary | ICD-10-CM | POA: Diagnosis not present

## 2016-05-01 DIAGNOSIS — I6529 Occlusion and stenosis of unspecified carotid artery: Secondary | ICD-10-CM | POA: Diagnosis not present

## 2016-05-16 DIAGNOSIS — I69354 Hemiplegia and hemiparesis following cerebral infarction affecting left non-dominant side: Secondary | ICD-10-CM | POA: Diagnosis not present

## 2016-05-16 DIAGNOSIS — I129 Hypertensive chronic kidney disease with stage 1 through stage 4 chronic kidney disease, or unspecified chronic kidney disease: Secondary | ICD-10-CM | POA: Diagnosis not present

## 2016-05-16 DIAGNOSIS — E1122 Type 2 diabetes mellitus with diabetic chronic kidney disease: Secondary | ICD-10-CM | POA: Diagnosis not present

## 2016-05-16 DIAGNOSIS — N182 Chronic kidney disease, stage 2 (mild): Secondary | ICD-10-CM | POA: Diagnosis not present

## 2016-05-16 DIAGNOSIS — J439 Emphysema, unspecified: Secondary | ICD-10-CM | POA: Diagnosis not present

## 2016-05-18 DIAGNOSIS — E1122 Type 2 diabetes mellitus with diabetic chronic kidney disease: Secondary | ICD-10-CM | POA: Diagnosis not present

## 2016-05-18 DIAGNOSIS — N182 Chronic kidney disease, stage 2 (mild): Secondary | ICD-10-CM | POA: Diagnosis not present

## 2016-05-18 DIAGNOSIS — I69354 Hemiplegia and hemiparesis following cerebral infarction affecting left non-dominant side: Secondary | ICD-10-CM | POA: Diagnosis not present

## 2016-05-18 DIAGNOSIS — I129 Hypertensive chronic kidney disease with stage 1 through stage 4 chronic kidney disease, or unspecified chronic kidney disease: Secondary | ICD-10-CM | POA: Diagnosis not present

## 2016-05-18 DIAGNOSIS — J439 Emphysema, unspecified: Secondary | ICD-10-CM | POA: Diagnosis not present

## 2016-05-19 DIAGNOSIS — I69354 Hemiplegia and hemiparesis following cerebral infarction affecting left non-dominant side: Secondary | ICD-10-CM | POA: Diagnosis not present

## 2016-05-19 DIAGNOSIS — E1122 Type 2 diabetes mellitus with diabetic chronic kidney disease: Secondary | ICD-10-CM | POA: Diagnosis not present

## 2016-05-19 DIAGNOSIS — J439 Emphysema, unspecified: Secondary | ICD-10-CM | POA: Diagnosis not present

## 2016-05-19 DIAGNOSIS — I129 Hypertensive chronic kidney disease with stage 1 through stage 4 chronic kidney disease, or unspecified chronic kidney disease: Secondary | ICD-10-CM | POA: Diagnosis not present

## 2016-05-19 DIAGNOSIS — N182 Chronic kidney disease, stage 2 (mild): Secondary | ICD-10-CM | POA: Diagnosis not present

## 2016-05-21 DIAGNOSIS — I69354 Hemiplegia and hemiparesis following cerebral infarction affecting left non-dominant side: Secondary | ICD-10-CM | POA: Diagnosis not present

## 2016-05-21 DIAGNOSIS — J439 Emphysema, unspecified: Secondary | ICD-10-CM | POA: Diagnosis not present

## 2016-05-21 DIAGNOSIS — E1122 Type 2 diabetes mellitus with diabetic chronic kidney disease: Secondary | ICD-10-CM | POA: Diagnosis not present

## 2016-05-21 DIAGNOSIS — N182 Chronic kidney disease, stage 2 (mild): Secondary | ICD-10-CM | POA: Diagnosis not present

## 2016-05-21 DIAGNOSIS — I129 Hypertensive chronic kidney disease with stage 1 through stage 4 chronic kidney disease, or unspecified chronic kidney disease: Secondary | ICD-10-CM | POA: Diagnosis not present

## 2016-05-25 DIAGNOSIS — I129 Hypertensive chronic kidney disease with stage 1 through stage 4 chronic kidney disease, or unspecified chronic kidney disease: Secondary | ICD-10-CM | POA: Diagnosis not present

## 2016-05-25 DIAGNOSIS — N182 Chronic kidney disease, stage 2 (mild): Secondary | ICD-10-CM | POA: Diagnosis not present

## 2016-05-25 DIAGNOSIS — I69354 Hemiplegia and hemiparesis following cerebral infarction affecting left non-dominant side: Secondary | ICD-10-CM | POA: Diagnosis not present

## 2016-05-25 DIAGNOSIS — J439 Emphysema, unspecified: Secondary | ICD-10-CM | POA: Diagnosis not present

## 2016-05-25 DIAGNOSIS — E1122 Type 2 diabetes mellitus with diabetic chronic kidney disease: Secondary | ICD-10-CM | POA: Diagnosis not present

## 2016-05-30 DIAGNOSIS — N182 Chronic kidney disease, stage 2 (mild): Secondary | ICD-10-CM | POA: Diagnosis not present

## 2016-05-30 DIAGNOSIS — E1122 Type 2 diabetes mellitus with diabetic chronic kidney disease: Secondary | ICD-10-CM | POA: Diagnosis not present

## 2016-05-30 DIAGNOSIS — I6935 Hemiplegia and hemiparesis following cerebral infarction: Secondary | ICD-10-CM | POA: Diagnosis not present

## 2016-05-30 DIAGNOSIS — I69354 Hemiplegia and hemiparesis following cerebral infarction affecting left non-dominant side: Secondary | ICD-10-CM | POA: Diagnosis not present

## 2016-05-30 DIAGNOSIS — J439 Emphysema, unspecified: Secondary | ICD-10-CM | POA: Diagnosis not present

## 2016-05-30 DIAGNOSIS — I129 Hypertensive chronic kidney disease with stage 1 through stage 4 chronic kidney disease, or unspecified chronic kidney disease: Secondary | ICD-10-CM | POA: Diagnosis not present

## 2016-06-01 DIAGNOSIS — N182 Chronic kidney disease, stage 2 (mild): Secondary | ICD-10-CM | POA: Diagnosis not present

## 2016-06-01 DIAGNOSIS — J439 Emphysema, unspecified: Secondary | ICD-10-CM | POA: Diagnosis not present

## 2016-06-01 DIAGNOSIS — I129 Hypertensive chronic kidney disease with stage 1 through stage 4 chronic kidney disease, or unspecified chronic kidney disease: Secondary | ICD-10-CM | POA: Diagnosis not present

## 2016-06-01 DIAGNOSIS — E1122 Type 2 diabetes mellitus with diabetic chronic kidney disease: Secondary | ICD-10-CM | POA: Diagnosis not present

## 2016-06-01 DIAGNOSIS — I69354 Hemiplegia and hemiparesis following cerebral infarction affecting left non-dominant side: Secondary | ICD-10-CM | POA: Diagnosis not present

## 2016-06-01 DIAGNOSIS — I6935 Hemiplegia and hemiparesis following cerebral infarction: Secondary | ICD-10-CM | POA: Diagnosis not present

## 2016-06-05 ENCOUNTER — Encounter: Payer: Self-pay | Admitting: Vascular Surgery

## 2016-06-05 DIAGNOSIS — E1122 Type 2 diabetes mellitus with diabetic chronic kidney disease: Secondary | ICD-10-CM | POA: Diagnosis not present

## 2016-06-05 DIAGNOSIS — I6935 Hemiplegia and hemiparesis following cerebral infarction: Secondary | ICD-10-CM | POA: Diagnosis not present

## 2016-06-05 DIAGNOSIS — J439 Emphysema, unspecified: Secondary | ICD-10-CM | POA: Diagnosis not present

## 2016-06-05 DIAGNOSIS — N6081 Other benign mammary dysplasias of right breast: Secondary | ICD-10-CM | POA: Diagnosis not present

## 2016-06-05 DIAGNOSIS — N182 Chronic kidney disease, stage 2 (mild): Secondary | ICD-10-CM | POA: Diagnosis not present

## 2016-06-05 DIAGNOSIS — I69354 Hemiplegia and hemiparesis following cerebral infarction affecting left non-dominant side: Secondary | ICD-10-CM | POA: Diagnosis not present

## 2016-06-05 DIAGNOSIS — I129 Hypertensive chronic kidney disease with stage 1 through stage 4 chronic kidney disease, or unspecified chronic kidney disease: Secondary | ICD-10-CM | POA: Diagnosis not present

## 2016-06-08 ENCOUNTER — Encounter: Payer: Self-pay | Admitting: Family

## 2016-06-08 ENCOUNTER — Ambulatory Visit (HOSPITAL_COMMUNITY)
Admission: RE | Admit: 2016-06-08 | Discharge: 2016-06-08 | Disposition: A | Discharge status: Home / Self Care | Payer: Medicare HMO | Source: Ambulatory Visit | Attending: Vascular Surgery | Admitting: Vascular Surgery

## 2016-06-08 ENCOUNTER — Ambulatory Visit (INDEPENDENT_AMBULATORY_CARE_PROVIDER_SITE_OTHER)
Admission: RE | Admit: 2016-06-08 | Discharge: 2016-06-08 | Disposition: A | Discharge status: Home / Self Care | Payer: Medicare HMO | Source: Ambulatory Visit | Attending: Vascular Surgery | Admitting: Vascular Surgery

## 2016-06-08 ENCOUNTER — Ambulatory Visit (INDEPENDENT_AMBULATORY_CARE_PROVIDER_SITE_OTHER): Payer: Medicare HMO | Admitting: Vascular Surgery

## 2016-06-08 VITALS — BP 141/75 | HR 57 | Temp 96.6°F | Resp 20 | Ht 66.0 in | Wt 173.0 lb

## 2016-06-08 DIAGNOSIS — I739 Peripheral vascular disease, unspecified: Secondary | ICD-10-CM | POA: Diagnosis not present

## 2016-06-08 DIAGNOSIS — E1151 Type 2 diabetes mellitus with diabetic peripheral angiopathy without gangrene: Secondary | ICD-10-CM | POA: Insufficient documentation

## 2016-06-08 DIAGNOSIS — Z95828 Presence of other vascular implants and grafts: Secondary | ICD-10-CM | POA: Diagnosis not present

## 2016-06-08 DIAGNOSIS — F17200 Nicotine dependence, unspecified, uncomplicated: Secondary | ICD-10-CM | POA: Insufficient documentation

## 2016-06-08 DIAGNOSIS — I10 Essential (primary) hypertension: Secondary | ICD-10-CM | POA: Insufficient documentation

## 2016-06-08 DIAGNOSIS — E785 Hyperlipidemia, unspecified: Secondary | ICD-10-CM | POA: Diagnosis not present

## 2016-06-08 NOTE — Patient Instructions (Signed)
Peripheral Vascular Disease Peripheral vascular disease (PVD) is a disease of the blood vessels that are not part of your heart and brain. A simple term for PVD is poor circulation. In most cases, PVD narrows the blood vessels that carry blood from your heart to the rest of your body. This can result in a decreased supply of blood to your arms, legs, and internal organs, like your stomach or kidneys. However, it most often affects a person's lower legs and feet. There are two types of PVD.  Organic PVD. This is the more common type. It is caused by damage to the structure of blood vessels.  Functional PVD. This is caused by conditions that make blood vessels contract and tighten (spasm). Without treatment, PVD tends to get worse over time. PVD can also lead to acute ischemic limb. This is when an arm or limb suddenly has trouble getting enough blood. This is a medical emergency. Follow these instructions at home:  Take medicines only as told by your doctor.  Do not use any tobacco products, including cigarettes, chewing tobacco, or electronic cigarettes. If you need help quitting, ask your doctor.  Lose weight if you are overweight, and maintain a healthy weight as told by your doctor.  Eat a diet that is low in fat and cholesterol. If you need help, ask your doctor.  Exercise regularly. Ask your doctor for some good activities for you.  Take good care of your feet.  Wear comfortable shoes that fit well.  Check your feet often for any cuts or sores. Contact a doctor if:  You have cramps in your legs while walking.  You have leg pain when you are at rest.  You have coldness in a leg or foot.  Your skin changes.  You are unable to get or have an erection (erectile dysfunction).  You have cuts or sores on your feet that are not healing. Get help right away if:  Your arm or leg turns cold and blue.  Your arms or legs become red, warm, swollen, painful, or numb.  You have  chest pain or trouble breathing.  You suddenly have weakness in your face, arm, or leg.  You become very confused or you cannot speak.  You suddenly have a very bad headache.  You suddenly cannot see. This information is not intended to replace advice given to you by your health care provider. Make sure you discuss any questions you have with your health care provider. Document Released: 08/09/2009 Document Revised: 10/21/2015 Document Reviewed: 10/23/2013 Elsevier Interactive Patient Education  2017 Elsevier Inc.      Steps to Quit Smoking Smoking tobacco can be bad for your health. It can also affect almost every organ in your body. Smoking puts you and people around you at risk for many serious long-lasting (chronic) diseases. Quitting smoking is hard, but it is one of the best things that you can do for your health. It is never too late to quit. What are the benefits of quitting smoking? When you quit smoking, you lower your risk for getting serious diseases and conditions. They can include:  Lung cancer or lung disease.  Heart disease.  Stroke.  Heart attack.  Not being able to have children (infertility).  Weak bones (osteoporosis) and broken bones (fractures). If you have coughing, wheezing, and shortness of breath, those symptoms may get better when you quit. You may also get sick less often. If you are pregnant, quitting smoking can help to lower your chances   of having a baby of low birth weight. What can I do to help me quit smoking? Talk with your doctor about what can help you quit smoking. Some things you can do (strategies) include:  Quitting smoking totally, instead of slowly cutting back how much you smoke over a period of time.  Going to in-person counseling. You are more likely to quit if you go to many counseling sessions.  Using resources and support systems, such as:  Online chats with a counselor.  Phone quitlines.  Printed self-help  materials.  Support groups or group counseling.  Text messaging programs.  Mobile phone apps or applications.  Taking medicines. Some of these medicines may have nicotine in them. If you are pregnant or breastfeeding, do not take any medicines to quit smoking unless your doctor says it is okay. Talk with your doctor about counseling or other things that can help you. Talk with your doctor about using more than one strategy at the same time, such as taking medicines while you are also going to in-person counseling. This can help make quitting easier. What things can I do to make it easier to quit? Quitting smoking might feel very hard at first, but there is a lot that you can do to make it easier. Take these steps:  Talk to your family and friends. Ask them to support and encourage you.  Call phone quitlines, reach out to support groups, or work with a counselor.  Ask people who smoke to not smoke around you.  Avoid places that make you want (trigger) to smoke, such as:  Bars.  Parties.  Smoke-break areas at work.  Spend time with people who do not smoke.  Lower the stress in your life. Stress can make you want to smoke. Try these things to help your stress:  Getting regular exercise.  Deep-breathing exercises.  Yoga.  Meditating.  Doing a body scan. To do this, close your eyes, focus on one area of your body at a time from head to toe, and notice which parts of your body are tense. Try to relax the muscles in those areas.  Download or buy apps on your mobile phone or tablet that can help you stick to your quit plan. There are many free apps, such as QuitGuide from the CDC (Centers for Disease Control and Prevention). You can find more support from smokefree.gov and other websites. This information is not intended to replace advice given to you by your health care provider. Make sure you discuss any questions you have with your health care provider. Document Released:  03/11/2009 Document Revised: 01/11/2016 Document Reviewed: 09/29/2014 Elsevier Interactive Patient Education  2017 Elsevier Inc.  

## 2016-06-08 NOTE — Progress Notes (Signed)
Patient is a 76 year old female who returns for follow-up today. She underwent a left femoral to below-knee popliteal bypass with reversed greater saphenous vein on 07/06/2014. This was done for a nonhealing ulcer on the left first toe. She also had claudication in the left leg. She states her claudication symptoms have completely resolved.  Her left first toe is completely healed.   She had one vessel runoff at the time of completion arteriogram. She recently underwent left total hip replacement which was uneventful.   She had a significant stroke approximately 2 years ago. 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 Her vision has improved. She still has weakness of her left arm and leg. Her carotid duplex at that time showed no significant stenosis.    Physical exam:    Vitals:   06/08/16 0918  BP: (!) 141/75  Pulse: (!) 57  Resp: 20  Temp: (!) 96.6 F (35.9 C)  TempSrc: Oral  SpO2: 100%  Weight: 173 lb (78.5 kg)  Height: '5\' 6"'$  (1.676 m)     Left lower extremity:  trace edema left lower extremity no pedal pulses palpable left popliteal pulse is palpable.    Data: Patient had arterial duplex exam today which I reviewed and interpreted which showed no narrowing of her bypass graft. She does have some residual occlusive disease in the popliteal artery below the bypass. Her ABIs today on the left was 0.96 right was 0.6.  This is unchanged over the last 2 years   Assessment: Doing well status post left femoral to below-knee popliteal bypass with a patent bypass graft    Her last carotid duplex exam was in 2016 and showed less than 40% stenosis bilaterally. She also had an exam several years prior to this. I do not believe she needs further carotid scans unless she develops any symptoms.   Of note she has also had repair of an abdominal aortic aneurysm by my partner Dr. Amedeo Plenty 2007. She has had no problems from this.   She does have a history of an  aberrant left subclavian artery with no significant changes in this in the last several years. However, she does not really want any intervention for this.   Plan: Follow-up in one year with a graft duplex and ABIs    Ruta Hinds, MD [facility] Office: [phone] Pager: [phone]

## 2016-06-09 NOTE — Addendum Note (Signed)
Addended by: Lianne Cure A on: 06/09/2016 10:30 AM   Modules accepted: Orders

## 2016-08-02 ENCOUNTER — Encounter (HOSPITAL_COMMUNITY): Payer: Self-pay | Admitting: Emergency Medicine

## 2016-08-02 DIAGNOSIS — Z96642 Presence of left artificial hip joint: Secondary | ICD-10-CM | POA: Diagnosis not present

## 2016-08-02 DIAGNOSIS — Z5321 Procedure and treatment not carried out due to patient leaving prior to being seen by health care provider: Secondary | ICD-10-CM | POA: Diagnosis not present

## 2016-08-02 DIAGNOSIS — I252 Old myocardial infarction: Secondary | ICD-10-CM | POA: Insufficient documentation

## 2016-08-02 DIAGNOSIS — Z8673 Personal history of transient ischemic attack (TIA), and cerebral infarction without residual deficits: Secondary | ICD-10-CM | POA: Insufficient documentation

## 2016-08-02 DIAGNOSIS — Z7982 Long term (current) use of aspirin: Secondary | ICD-10-CM | POA: Diagnosis not present

## 2016-08-02 DIAGNOSIS — L02214 Cutaneous abscess of groin: Secondary | ICD-10-CM | POA: Insufficient documentation

## 2016-08-02 DIAGNOSIS — E119 Type 2 diabetes mellitus without complications: Secondary | ICD-10-CM | POA: Insufficient documentation

## 2016-08-02 DIAGNOSIS — Z7984 Long term (current) use of oral hypoglycemic drugs: Secondary | ICD-10-CM | POA: Insufficient documentation

## 2016-08-02 DIAGNOSIS — F17210 Nicotine dependence, cigarettes, uncomplicated: Secondary | ICD-10-CM | POA: Insufficient documentation

## 2016-08-02 DIAGNOSIS — I10 Essential (primary) hypertension: Secondary | ICD-10-CM | POA: Diagnosis not present

## 2016-08-02 DIAGNOSIS — Z96652 Presence of left artificial knee joint: Secondary | ICD-10-CM | POA: Diagnosis not present

## 2016-08-02 NOTE — ED Triage Notes (Addendum)
Pt to ED from home c/o abscess to L groin area. Pt had surgery to "re-route some blood vessels a year ago," and where the incision is from that is where there is swelling x 2 weeks and increasing pain over the past week. Pt states it felt wet and then when she went to wipe, there was blood on the tissue. Denies fevers/chills.

## 2016-08-03 ENCOUNTER — Emergency Department (HOSPITAL_COMMUNITY)
Admission: EM | Admit: 2016-08-03 | Discharge: 2016-08-03 | Disposition: A | Discharge status: Home / Self Care | Payer: Medicare HMO | Attending: Dermatology | Admitting: Dermatology

## 2016-08-03 DIAGNOSIS — L02224 Furuncle of groin: Secondary | ICD-10-CM | POA: Diagnosis not present

## 2016-08-03 NOTE — ED Notes (Signed)
The pt is tired of waiting and she will follow up with her doctor today  And will return ifshe gets worse

## 2016-08-03 NOTE — ED Notes (Signed)
pts family asking how much longer and asking for coffee or food pt is npo until after a doctor sees her

## 2016-09-08 DIAGNOSIS — L0292 Furuncle, unspecified: Secondary | ICD-10-CM | POA: Diagnosis not present

## 2016-09-08 DIAGNOSIS — N611 Abscess of the breast and nipple: Secondary | ICD-10-CM | POA: Diagnosis not present

## 2016-09-08 DIAGNOSIS — R05 Cough: Secondary | ICD-10-CM | POA: Diagnosis not present

## 2016-09-19 DIAGNOSIS — E559 Vitamin D deficiency, unspecified: Secondary | ICD-10-CM | POA: Diagnosis not present

## 2016-09-19 DIAGNOSIS — E785 Hyperlipidemia, unspecified: Secondary | ICD-10-CM | POA: Diagnosis not present

## 2016-09-19 DIAGNOSIS — Z79899 Other long term (current) drug therapy: Secondary | ICD-10-CM | POA: Diagnosis not present

## 2016-09-19 DIAGNOSIS — E1121 Type 2 diabetes mellitus with diabetic nephropathy: Secondary | ICD-10-CM | POA: Diagnosis not present

## 2016-09-19 DIAGNOSIS — Z7984 Long term (current) use of oral hypoglycemic drugs: Secondary | ICD-10-CM | POA: Diagnosis not present

## 2016-09-27 DIAGNOSIS — F17290 Nicotine dependence, other tobacco product, uncomplicated: Secondary | ICD-10-CM | POA: Diagnosis not present

## 2016-09-27 DIAGNOSIS — I739 Peripheral vascular disease, unspecified: Secondary | ICD-10-CM | POA: Diagnosis not present

## 2016-09-27 DIAGNOSIS — I252 Old myocardial infarction: Secondary | ICD-10-CM | POA: Diagnosis not present

## 2016-09-27 DIAGNOSIS — I639 Cerebral infarction, unspecified: Secondary | ICD-10-CM | POA: Diagnosis not present

## 2016-09-27 DIAGNOSIS — I714 Abdominal aortic aneurysm, without rupture: Secondary | ICD-10-CM | POA: Diagnosis not present

## 2016-09-27 DIAGNOSIS — I10 Essential (primary) hypertension: Secondary | ICD-10-CM | POA: Diagnosis not present

## 2016-09-27 DIAGNOSIS — I25118 Atherosclerotic heart disease of native coronary artery with other forms of angina pectoris: Secondary | ICD-10-CM | POA: Diagnosis not present

## 2016-09-27 DIAGNOSIS — E785 Hyperlipidemia, unspecified: Secondary | ICD-10-CM | POA: Diagnosis not present

## 2017-01-26 DIAGNOSIS — I739 Peripheral vascular disease, unspecified: Secondary | ICD-10-CM | POA: Diagnosis not present

## 2017-01-26 DIAGNOSIS — F17290 Nicotine dependence, other tobacco product, uncomplicated: Secondary | ICD-10-CM | POA: Diagnosis not present

## 2017-01-26 DIAGNOSIS — E119 Type 2 diabetes mellitus without complications: Secondary | ICD-10-CM | POA: Diagnosis not present

## 2017-01-26 DIAGNOSIS — I714 Abdominal aortic aneurysm, without rupture: Secondary | ICD-10-CM | POA: Diagnosis not present

## 2017-01-26 DIAGNOSIS — I252 Old myocardial infarction: Secondary | ICD-10-CM | POA: Diagnosis not present

## 2017-01-26 DIAGNOSIS — I25110 Atherosclerotic heart disease of native coronary artery with unstable angina pectoris: Secondary | ICD-10-CM | POA: Diagnosis not present

## 2017-01-26 DIAGNOSIS — E785 Hyperlipidemia, unspecified: Secondary | ICD-10-CM | POA: Diagnosis not present

## 2017-01-26 DIAGNOSIS — I10 Essential (primary) hypertension: Secondary | ICD-10-CM | POA: Diagnosis not present

## 2017-01-26 DIAGNOSIS — I639 Cerebral infarction, unspecified: Secondary | ICD-10-CM | POA: Diagnosis not present

## 2017-03-06 ENCOUNTER — Other Ambulatory Visit: Payer: Self-pay | Admitting: Pharmacy Technician

## 2017-03-06 NOTE — Patient Outreach (Signed)
[facility] [place]) Care Management  03/06/2017  [name] 02-Sep-1940 [id number]   Contacted patient in regards to Atorvastatin Medication adherence. No answer, Left HIPAA compliant voicemail.  [name], [place] [facility] [phone]

## 2017-04-28 ENCOUNTER — Observation Stay (HOSPITAL_COMMUNITY)
Admission: EM | Admit: 2017-04-28 | Discharge: 2017-04-28 | Disposition: A | Discharge status: Home / Self Care | Payer: Medicare HMO | Attending: Internal Medicine | Admitting: Internal Medicine

## 2017-04-28 ENCOUNTER — Emergency Department (HOSPITAL_COMMUNITY): Payer: Medicare HMO

## 2017-04-28 ENCOUNTER — Encounter (HOSPITAL_COMMUNITY): Payer: Self-pay

## 2017-04-28 DIAGNOSIS — R001 Bradycardia, unspecified: Secondary | ICD-10-CM | POA: Diagnosis not present

## 2017-04-28 DIAGNOSIS — I714 Abdominal aortic aneurysm, without rupture: Secondary | ICD-10-CM | POA: Insufficient documentation

## 2017-04-28 DIAGNOSIS — M1990 Unspecified osteoarthritis, unspecified site: Secondary | ICD-10-CM | POA: Insufficient documentation

## 2017-04-28 DIAGNOSIS — F4322 Adjustment disorder with anxiety: Secondary | ICD-10-CM | POA: Diagnosis not present

## 2017-04-28 DIAGNOSIS — E1151 Type 2 diabetes mellitus with diabetic peripheral angiopathy without gangrene: Secondary | ICD-10-CM | POA: Diagnosis not present

## 2017-04-28 DIAGNOSIS — E1165 Type 2 diabetes mellitus with hyperglycemia: Secondary | ICD-10-CM

## 2017-04-28 DIAGNOSIS — R0789 Other chest pain: Secondary | ICD-10-CM | POA: Diagnosis not present

## 2017-04-28 DIAGNOSIS — H409 Unspecified glaucoma: Secondary | ICD-10-CM | POA: Insufficient documentation

## 2017-04-28 DIAGNOSIS — K219 Gastro-esophageal reflux disease without esophagitis: Secondary | ICD-10-CM | POA: Diagnosis not present

## 2017-04-28 DIAGNOSIS — Z8249 Family history of ischemic heart disease and other diseases of the circulatory system: Secondary | ICD-10-CM | POA: Insufficient documentation

## 2017-04-28 DIAGNOSIS — Z7982 Long term (current) use of aspirin: Secondary | ICD-10-CM | POA: Insufficient documentation

## 2017-04-28 DIAGNOSIS — Z8673 Personal history of transient ischemic attack (TIA), and cerebral infarction without residual deficits: Secondary | ICD-10-CM

## 2017-04-28 DIAGNOSIS — R7989 Other specified abnormal findings of blood chemistry: Secondary | ICD-10-CM | POA: Diagnosis not present

## 2017-04-28 DIAGNOSIS — F4320 Adjustment disorder, unspecified: Secondary | ICD-10-CM

## 2017-04-28 DIAGNOSIS — I252 Old myocardial infarction: Secondary | ICD-10-CM | POA: Diagnosis not present

## 2017-04-28 DIAGNOSIS — Z7902 Long term (current) use of antithrombotics/antiplatelets: Secondary | ICD-10-CM | POA: Diagnosis not present

## 2017-04-28 DIAGNOSIS — I10 Essential (primary) hypertension: Secondary | ICD-10-CM | POA: Diagnosis not present

## 2017-04-28 DIAGNOSIS — M5489 Other dorsalgia: Secondary | ICD-10-CM | POA: Diagnosis not present

## 2017-04-28 DIAGNOSIS — F4321 Adjustment disorder with depressed mood: Secondary | ICD-10-CM

## 2017-04-28 DIAGNOSIS — I70219 Atherosclerosis of native arteries of extremities with intermittent claudication, unspecified extremity: Secondary | ICD-10-CM | POA: Diagnosis present

## 2017-04-28 DIAGNOSIS — I69354 Hemiplegia and hemiparesis following cerebral infarction affecting left non-dominant side: Secondary | ICD-10-CM | POA: Insufficient documentation

## 2017-04-28 DIAGNOSIS — I2510 Atherosclerotic heart disease of native coronary artery without angina pectoris: Secondary | ICD-10-CM | POA: Diagnosis not present

## 2017-04-28 DIAGNOSIS — R52 Pain, unspecified: Secondary | ICD-10-CM | POA: Diagnosis not present

## 2017-04-28 DIAGNOSIS — Z880 Allergy status to penicillin: Secondary | ICD-10-CM | POA: Insufficient documentation

## 2017-04-28 DIAGNOSIS — F17210 Nicotine dependence, cigarettes, uncomplicated: Secondary | ICD-10-CM | POA: Insufficient documentation

## 2017-04-28 DIAGNOSIS — R079 Chest pain, unspecified: Principal | ICD-10-CM | POA: Insufficient documentation

## 2017-04-28 DIAGNOSIS — E1351 Other specified diabetes mellitus with diabetic peripheral angiopathy without gangrene: Secondary | ICD-10-CM | POA: Diagnosis present

## 2017-04-28 DIAGNOSIS — E785 Hyperlipidemia, unspecified: Secondary | ICD-10-CM | POA: Diagnosis not present

## 2017-04-28 LAB — CBC WITH DIFFERENTIAL/PLATELET
BASOS ABS: 0 10*3/uL (ref 0.0–0.1)
Basophils Relative: 0 %
EOS ABS: 0.1 10*3/uL (ref 0.0–0.7)
Eosinophils Relative: 1 %
HCT: 39.9 % (ref 36.0–46.0)
Hemoglobin: 13.2 g/dL (ref 12.0–15.0)
Lymphocytes Relative: 20 %
Lymphs Abs: 1.5 10*3/uL (ref 0.7–4.0)
MCH: 28.6 pg (ref 26.0–34.0)
MCHC: 33.1 g/dL (ref 30.0–36.0)
MCV: 86.6 fL (ref 78.0–100.0)
MONO ABS: 0.4 10*3/uL (ref 0.1–1.0)
MONOS PCT: 5 %
Neutro Abs: 5.8 10*3/uL (ref 1.7–7.7)
Neutrophils Relative %: 74 %
PLATELETS: 186 10*3/uL (ref 150–400)
RBC: 4.61 MIL/uL (ref 3.87–5.11)
RDW: 15.1 % (ref 11.5–15.5)
WBC: 7.8 10*3/uL (ref 4.0–10.5)

## 2017-04-28 LAB — COMPREHENSIVE METABOLIC PANEL
ALT: 11 U/L — ABNORMAL LOW (ref 14–54)
ANION GAP: 8 (ref 5–15)
AST: 16 U/L (ref 15–41)
Albumin: 3.4 g/dL — ABNORMAL LOW (ref 3.5–5.0)
Alkaline Phosphatase: 64 U/L (ref 38–126)
BUN: 15 mg/dL (ref 6–20)
CHLORIDE: 104 mmol/L (ref 101–111)
CO2: 24 mmol/L (ref 22–32)
Calcium: 9.2 mg/dL (ref 8.9–10.3)
Creatinine, Ser: 1.17 mg/dL — ABNORMAL HIGH (ref 0.44–1.00)
GFR calc Af Amer: 51 mL/min — ABNORMAL LOW (ref >60)
GFR calc non Af Amer: 44 mL/min — ABNORMAL LOW (ref >60)
Glucose, Bld: 170 mg/dL — ABNORMAL HIGH (ref 65–99)
POTASSIUM: 4.1 mmol/L (ref 3.5–5.1)
SODIUM: 136 mmol/L (ref 135–145)
TOTAL PROTEIN: 7.8 g/dL (ref 6.5–8.1)
Total Bilirubin: 0.5 mg/dL (ref 0.3–1.2)

## 2017-04-28 LAB — D-DIMER, QUANTITATIVE (NOT AT ARMC): D DIMER QUANT: 3.81 ug{FEU}/mL — AB (ref 0.00–0.50)

## 2017-04-28 LAB — I-STAT TROPONIN, ED: Troponin i, poc: 0 ng/mL (ref 0.00–0.08)

## 2017-04-28 LAB — BRAIN NATRIURETIC PEPTIDE: B NATRIURETIC PEPTIDE 5: 29.3 pg/mL (ref 0.0–100.0)

## 2017-04-28 MED ORDER — INSULIN ASPART 100 UNIT/ML ~~LOC~~ SOLN: 0.0000 [IU] | Freq: Three times a day (TID) | SUBCUTANEOUS | Status: DC

## 2017-04-28 MED ORDER — METOPROLOL SUCCINATE ER 50 MG PO TB24: 50.0000 mg | ORAL_TABLET | Freq: Every day | ORAL | Status: DC

## 2017-04-28 MED ORDER — ISOSORBIDE MONONITRATE ER 30 MG PO TB24: 60.0000 mg | ORAL_TABLET | Freq: Every day | ORAL | Status: DC

## 2017-04-28 MED ORDER — IOPAMIDOL (ISOVUE-370) INJECTION 76%
INTRAVENOUS | Status: AC
  Administered 2017-04-28: 80 mL
  Filled 2017-04-28: qty 100

## 2017-04-28 MED ORDER — INSULIN ASPART 100 UNIT/ML ~~LOC~~ SOLN: 4.0000 [IU] | Freq: Three times a day (TID) | SUBCUTANEOUS | Status: DC

## 2017-04-28 MED ORDER — ALBUTEROL SULFATE (2.5 MG/3ML) 0.083% IN NEBU: 3.0000 mL | INHALATION_SOLUTION | RESPIRATORY_TRACT | Status: DC | PRN

## 2017-04-28 MED ORDER — ASPIRIN EC 81 MG PO TBEC: 81.0000 mg | DELAYED_RELEASE_TABLET | Freq: Every day | ORAL | Status: DC

## 2017-04-28 MED ORDER — ENOXAPARIN SODIUM 40 MG/0.4ML ~~LOC~~ SOLN: 40.0000 mg | SUBCUTANEOUS | Status: DC

## 2017-04-28 MED ORDER — ONDANSETRON HCL 4 MG/2ML IJ SOLN: 4.0000 mg | Freq: Four times a day (QID) | INTRAMUSCULAR | Status: DC | PRN

## 2017-04-28 MED ORDER — VITAMIN B-12 100 MCG PO TABS: 50.0000 ug | ORAL_TABLET | Freq: Every day | ORAL | Status: DC

## 2017-04-28 MED ORDER — AMLODIPINE BESYLATE 5 MG PO TABS: 5.0000 mg | ORAL_TABLET | Freq: Two times a day (BID) | ORAL | Status: DC

## 2017-04-28 MED ORDER — TICAGRELOR 90 MG PO TABS: 90.0000 mg | ORAL_TABLET | Freq: Two times a day (BID) | ORAL | Status: DC

## 2017-04-28 MED ORDER — NITROGLYCERIN 0.4 MG SL SUBL: 0.4000 mg | SUBLINGUAL_TABLET | SUBLINGUAL | Status: DC | PRN

## 2017-04-28 MED ORDER — NITROGLYCERIN 0.4 MG SL SUBL
0.4000 mg | SUBLINGUAL_TABLET | SUBLINGUAL | Status: DC | PRN
  Administered 2017-04-28: 0.4 mg via SUBLINGUAL
  Filled 2017-04-28: qty 1

## 2017-04-28 MED ORDER — ACETAMINOPHEN 325 MG PO TABS: 650.0000 mg | ORAL_TABLET | Freq: Four times a day (QID) | ORAL | Status: DC | PRN

## 2017-04-28 MED ORDER — METHOCARBAMOL 500 MG PO TABS: 500.0000 mg | ORAL_TABLET | Freq: Four times a day (QID) | ORAL | Status: DC | PRN

## 2017-04-28 MED ORDER — ZOLPIDEM TARTRATE 5 MG PO TABS: 5.0000 mg | ORAL_TABLET | Freq: Every evening | ORAL | Status: DC | PRN

## 2017-04-28 MED ORDER — ACETAMINOPHEN 325 MG PO TABS
650.0000 mg | ORAL_TABLET | ORAL | Status: DC | PRN
Start: 2017-04-28 — End: 2017-04-28

## 2017-04-28 MED ORDER — GLIMEPIRIDE 2 MG PO TABS: 2.0000 mg | ORAL_TABLET | Freq: Two times a day (BID) | ORAL | Status: DC

## 2017-04-28 MED ORDER — CLONAZEPAM 0.5 MG PO TBDP: 0.5000 mg | ORAL_TABLET | Freq: Two times a day (BID) | ORAL | Status: DC

## 2017-04-28 MED ORDER — GI COCKTAIL ~~LOC~~: 30.0000 mL | Freq: Four times a day (QID) | ORAL | Status: DC | PRN

## 2017-04-28 NOTE — ED Notes (Signed)
Pt was very addiment about leaving. Pt was advised by MD to stay but insisted on leaving.

## 2017-04-28 NOTE — ED Notes (Signed)
Admitting at the bedside.  

## 2017-04-28 NOTE — ED Notes (Signed)
Pt requesting coffee and water, explained to pt and pt family that pt is NPO. Pt verbalized understanding

## 2017-04-28 NOTE — ED Provider Notes (Signed)
[facility] EMERGENCY DEPARTMENT Provider Note   CSN: [id number] Arrival date & time: 04/28/17  3419  History   Chief Complaint Chief Complaint  Patient presents with  . Back Pain   HPI Laura is a 76 y.o. female with a PMHx significant for DM, HTN, CAD, CVA, and tobacco abuse (40 pack year history) who presented to the ED with acute onset chest pain. She states that this morning she woke up at 4AM with chest pain around her braw line that was worsened with talking and exertion. She was nauseous and experienced 1 episode on non-bloody emesis. She endorse diaphoresis and recent upper respiratory tract infection symptoms including cough. She does not think it is related to her heart but is unable to recall what her chest pain felt like during her STEMI. She acknowledges that she did have some similar pain yesterday that was relieved by nitroglycerin. She wanted to come get evaluated to make sure this is not related to her heart. She thinks it is a pulled muscle. She endorses increased LE swelling and orthopnea. Prior echocardiogram in 2016 did show EF of 45-50% with G1DD.   She also expresses concern about some bilateral hip and lower back pain. States that she was out shopping yesterday and started to have pain in her hips and back. This urged her to go home and relax. She doesn't recall having anything like this before. It has improved slightly with rest.   Endorses arthralgias from "arthritis." Denies HA, visual changes, SOB, palpitations, abdominal pain, diarrhea/constipation, dysuria, polyuria, myalgias.   Past Medical History:  Diagnosis Date  . AAA (abdominal aortic aneurysm) ([place])   . Aneurysm ([place])   . Arthritis    osteoarthritis  . Constipation   . Diabetes mellitus    Type 2, on Glimiperide  . GERD (gastroesophageal reflux disease)   . Glaucoma   . Hyperlipidemia   . Hypertension   . Kidney stones   . Myocardial infarction ([place])   . Peripheral  vascular disease ([place])   . Pneumonia   . Stroke ([place]) 01/2015   with left side weakness   . TIA (transient ischemic attack)   . Urinary incontinence    Patient Active Problem List   Diagnosis Date Noted  . Osteoarthritis of left hip 05/18/2015  . Status post total replacement of left hip 05/18/2015  . Coronary artery disease involving native coronary artery of native heart with angina pectoris ([place]) 04/16/2015  . History of stroke 04/16/2015  . Type 2 diabetes mellitus with circulatory disorder ([place]) 04/16/2015  . Cerebrovascular 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 artery ([place]) 04/16/2015  . HLD (hyperlipidemia)   . Acute CVA (cerebrovascular accident) ([place]) 02/19/2015  . Transient ischemic attack (TIA) 02/18/2015  . Stroke ([place])   . Cerebral infarction due to thrombosis of right middle cerebral artery ([place])   . Acute myocardial infarction of inferolateral wall ([facility]) 04/12/2014  . Numbness 03/18/2014  . TIA (transient ischemic attack) 03/18/2014  . DM (diabetes mellitus) type II uncontrolled, periph vascular disorder ([place]) 03/18/2014  . Hyperlipidemia   . Hypertension   . PVD (peripheral vascular disease) ([place]) 03/05/2014  . Transient cerebral ischemia 08/01/2012  . Atherosclerosis of native arteries of extremity with intermittent claudication ([place]) 10/19/2011  . Unspecified disorders of arteries and arterioles 10/19/2011  . PAD (peripheral artery disease) ([place]) 04/13/2011   Past Surgical History:  Procedure Laterality Date  . ABDOMINAL AORTAGRAM N/A 03/13/2014   Procedure: ABDOMINAL AORTAGRAM;  Surgeon: Elam Dutch, MD;  Location: [facility] CATH LAB;  Service: Cardiovascular;  Laterality: N/A;  . ABDOMINAL AORTIC ANEURYSM REPAIR    . ABDOMINAL AORTIC ANEURYSM REPAIR  ?2012  . ABDOMINAL HYSTERECTOMY     partial  . BACK SURGERY    . CARDIAC CATHETERIZATION    . CORONARY ANGIOPLASTY  03/2015  . EYE SURGERY Right    laser surgery for blood behind eye, loss  of sight  . FEMORAL-POPLITEAL BYPASS GRAFT Left 07/06/2014   Procedure: BYPASS GRAFT FEMORAL-POPLITEAL ARTERY;  Surgeon: Elam Dutch, MD;  Location: [facility];  Service: Vascular;  Laterality: Left;  . HERNIA REPAIR    . JOINT REPLACEMENT    . LEFT HEART CATHETERIZATION WITH CORONARY ANGIOGRAM N/A 04/12/2014   Procedure: LEFT HEART CATHETERIZATION WITH CORONARY ANGIOGRAM;  Surgeon: Clent Demark, MD;  Location: [facility] CATH LAB;  Service: Cardiovascular;  Laterality: N/A;  . PERCUTANEOUS CORONARY STENT INTERVENTION (PCI-S)  04/12/2014   Procedure: PERCUTANEOUS CORONARY STENT INTERVENTION (PCI-S);  Surgeon: Clent Demark, MD;  Location: [facility] CATH LAB;  Service: Cardiovascular;;  prox and mid RCA  . TOTAL HIP ARTHROPLASTY Left 05/18/2015  . TOTAL HIP ARTHROPLASTY Left 05/18/2015   Procedure: LEFT TOTAL HIP ARTHROPLASTY ANTERIOR APPROACH;  Surgeon: Mcarthur Rossetti, MD;  Location: [place];  Service: Orthopedics;  Laterality: Left;  . TOTAL KNEE ARTHROPLASTY Left ~ 2003  . TUBAL LIGATION    . uterine tumor    . VENTRAL HERNIA REPAIR     OB History    No data available     Home Medications    Prior to Admission medications   Medication Sig Start Date End Date Taking? Authorizing Provider  acetaminophen (TYLENOL) 325 MG tablet Take 2 tablets (650 mg total) by mouth every 6 (six) hours as needed for mild pain or moderate pain (or Fever >/= 101). 05/21/15  Yes Mcarthur Rossetti, MD  albuterol (PROVENTIL HFA;VENTOLIN HFA) 108 (90 Base) MCG/ACT inhaler Inhale 2 puffs into the lungs every 4 (four) hours as needed for wheezing or shortness of breath (or coughing). 7/90/24  Yes Delora Fuel, MD  amLODipine (NORVASC) 5 MG tablet Take 5 mg by mouth 2 (two) times daily.    Yes [provider]  aspirin EC 81 MG tablet Take 81 mg by mouth daily.   Yes [provider]  glimepiride (AMARYL) 2 MG tablet Take 2-3 mg by mouth 2 (two) times daily. Take 3 mg by mouth in the morning and  take 2 mg by mouth in the afternoon (around 6 pm)   Yes [provider]  isosorbide mononitrate (IMDUR) 60 MG 24 hr tablet Take 60 mg by mouth daily. 02/24/15  Yes [provider]  metoprolol (TOPROL-XL) 50 MG 24 hr tablet Take 50 mg by mouth daily.    Yes [provider]  ticagrelor (BRILINTA) 90 MG TABS tablet Take 1 tablet (90 mg total) by mouth 2 (two) times daily. 04/15/14  Yes [name], MD  vitamin B-12 (CYANOCOBALAMIN) 50 MCG tablet Take 50 mcg by mouth daily.   Yes [provider]  methocarbamol (ROBAXIN) 500 MG tablet Take 1 tablet (500 mg total) by mouth every 6 (six) hours as needed for muscle spasms. Patient not taking: Reported on 06/08/2016 05/21/15   Mcarthur Rossetti, MD  nitroGLYCERIN (NITROSTAT) 0.4 MG SL tablet Place 0.4 mg under the tongue every 5 (five) minutes as needed for chest pain.    [provider]   Family History  Family History  Problem Relation Age of Onset  . Diabetes Mother   . Stroke Mother   . Hypertension Father   . Stroke Father   . Hyperlipidemia Sister   . Hypertension Sister   . Aneurysm Sister   . Hyperlipidemia Brother   . Hypertension Brother    Social History Social History   Tobacco Use  . Smoking status: Current Every Day Smoker    Packs/day: 0.50    Years: 40.00    Pack years: 20.00    Types: Cigarettes  . Smokeless tobacco: Never Used  Substance Use Topics  . Alcohol use: No    Alcohol/week: 0.0 oz  . Drug use: No   Allergies   Keflex [cephalexin]; Codeine; Lipitor [atorvastatin]; Azor [amlodipine-olmesartan]; Lisinopril; and Penicillins  Review of Systems  All systems reviewed and are negative for acute change except as noted in the HPI.  Physical Exam Updated Vital Signs BP (!) 177/71   Pulse (!) 58   Temp 98 F (36.7 C) (Oral)   Resp 17   SpO2 97%   Physical Exam  Constitutional: She is oriented to person, place, and time. She appears well-developed and  well-nourished. No distress.  HENT:  Head: Normocephalic and atraumatic.  Neck: No JVD present.  Cardiovascular: Normal rate, regular rhythm and normal heart sounds.  Pulmonary/Chest: Effort normal and breath sounds normal. She has no wheezes. She has no rales.  Abdominal: Soft. Bowel sounds are normal. There is no tenderness. There is no guarding.  Musculoskeletal: Normal range of motion. She exhibits no edema.  Lymphadenopathy:    She has no cervical adenopathy.  Neurological: She is alert and oriented to person, place, and time.  Skin: Skin is warm and dry.   EKG: Sinus bradycardia with normal axis and no St elevation, T wave inversion, or new LBBB. Q waves in V5 and V6. Stable compared to prior   CXR: Rotated with poor penetrance. Difficult to assess for cardiomegaly given it is a portable AP. She does appear to have increased pulmonary vascular congestion. No opacities or infiltrates. No bony or soft tissue abnormalities.   ED Treatments / Results  Labs (all labs ordered are listed, but only abnormal results are displayed) Labs Reviewed  COMPREHENSIVE METABOLIC PANEL - Abnormal; Notable for the following components:      Result Value   Glucose, Bld 170 (*)    Creatinine, Ser 1.17 (*)    Albumin 3.4 (*)    ALT 11 (*)    GFR calc non Af Amer 44 (*)    GFR calc Af Amer 51 (*)    All other components within normal limits  D-DIMER, QUANTITATIVE (NOT AT [facility]) - Abnormal; Notable for the following components:   D-Dimer, Quant 3.81 (*)    All other components within normal limits  CBC WITH DIFFERENTIAL/PLATELET  BRAIN NATRIURETIC PEPTIDE  I-STAT TROPONIN, ED   EKG  EKG Interpretation  Date/Time:  Saturday April 28 2017 11:32:25 EST Ventricular Rate:  47 PR Interval:    QRS Duration: 105 QT Interval:  504 QTC Calculation: 446 R Axis:   78 Text Interpretation:  Sinus bradycardia Probable left ventricular hypertrophy No significant change since last tracing Confirmed by  Addison Lank [phone]) on 04/28/2017 12:20:22 PM Also confirmed by Addison Lank [phone]), editor Laurena Spies [phone])  on 04/28/2017 1:08:58 PM      Radiology Dg Chest 1 View  Result Date: 04/28/2017 CLINICAL DATA:  Chest pain, back pain EXAM: CHEST 1 VIEW COMPARISON:  04/27/2016 FINDINGS: Vascular congestion. Heart is borderline in size. No confluent opacities or effusions. No acute bony abnormality. IMPRESSION: Vascular congestion. Electronically Signed   By: Rolm Baptise M.D.   On: 04/28/2017 12:20   Ct Angio Chest Pe W/cm &/or Wo Cm  Result Date: 04/28/2017 CLINICAL DATA:  Thoracic and lumbar pain.  Elevated D-dimer. EXAM: CT ANGIOGRAPHY CHEST WITH CONTRAST TECHNIQUE: Multidetector CT imaging of the chest was performed using the standard protocol during bolus administration of intravenous contrast. Multiplanar CT image reconstructions and MIPs were obtained to evaluate the vascular anatomy. CONTRAST:  51mL ISOVUE-370 IOPAMIDOL (ISOVUE-370) INJECTION 76% COMPARISON:  Chest x-ray April 28, 2017. Chest CT April 27, 2016. FINDINGS: Cardiovascular: The thoracic aorta is not well assessed due to timing of contrast. 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 No other aneurysmal dilatation. Atherosclerotic changes are identified. Evaluation for dissection is limited but none is seen. Coronary artery calcifications are again identified. The heart is borderline but stable. No pulmonary emboli. Mediastinum/Nodes: The thyroid and esophagus are normal. Prominent AP window nodes are seen on series 5, image 49, not changed since 2015 and of doubtful acute significance. No new adenopathy in the mediastinum or hila. No other adenopathy in the chest. No effusions. Lungs/Pleura: The central airways are normal. No pneumothorax. Emphysematous  changes in the lungs persists. The 7 mm nodule in the right upper lobe posteriorly on series 6, image 37 is unchanged since 2015. No other nodules, masses, or infiltrates. Upper Abdomen: No acute abnormality. Musculoskeletal: Degenerative changes in the thoracic spine with no obvious fracture or malalignment. Review of the MIP images confirms the above findings. IMPRESSION: 1. No pulmonary embolus. 2. Atherosclerotic change in the thoracic aorta. 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 3. Emphysematous changes in the lungs. Stable nodule in the right apex for 3 years. 4. Coronary artery calcified atherosclerosis. 5. Prominent AP window nodes, unchanged since 2015, likely reactive. 6. No other abnormalities. Aortic Atherosclerosis (ICD10-I70.0) and Emphysema (ICD10-J43.9). Electronically Signed   By: Dorise Bullion III M.D   On: 04/28/2017 14:22   Procedures Procedures (including critical care time)  Medications Ordered in ED Medications  nitroGLYCERIN (NITROSTAT) SL tablet 0.4 mg (0.4 mg Sublingual Given 04/28/17 1359)  iopamidol (ISOVUE-370) 76 % injection (80 mLs  Contrast Given 04/28/17 1345)   Initial Impression / Assessment and Plan / ED Course  I have reviewed the triage vital signs and the nursing notes.  Pertinent labs & imaging results that were available during my care of the patient were reviewed by me and considered in my medical decision making (see chart for details).    Ms. [name] is a 76 y.o female with a PMHx significant for DM, HTN, CAD, CVA, and tobacco abuse (40 pack year history) who presented to the ED with acute onset chest pain. Given her risk factors she is high risk (TIMI score of 5). She was given a 1 nitroglycerin with complete resolution of her chest pain. She is moderate risk for a PE and her d-dimer was acutely elevated to 3.81. A CTA was ordered to evaluate for PE that illustrated no PE.  She was to be admitted to [facility]  service for further ACS work-up; however, elected to leave against medical advice. EDP and admitting team advised against her decision and  explained all the risks of leaving against medical advice. Patient and family voiced understanding but elected to leave.  Final Clinical Impressions(s) / ED Diagnoses   Final diagnoses:  Chest pain with high risk for cardiac etiology    ED Discharge Orders    None     Ina Homes, MD 04/28/17 1427    Ina Homes, MD 04/28/17 1453    Ina Homes, MD 04/28/17 1640    Fatima Blank, MD 04/28/17 2012

## 2017-04-28 NOTE — ED Notes (Signed)
Upon rounding on pt, pt family states pt has walked to the bathroom. Pt reports NTG decreased her pain but it increased on exertion. Pt educated she is to use call light and use the bedside commode or bedpan for toileting.

## 2017-04-28 NOTE — ED Triage Notes (Signed)
Patient arrived by [facility] for thoracic and lumbar back pain. States incredible stress at home with loss of 2 children and taking care of disabled family member. Received fentanyl pta

## 2017-04-28 NOTE — ED Notes (Signed)
Pt in CT at this time, will administer nitro per admitting on return. Family updated

## 2017-04-28 NOTE — ED Notes (Signed)
PT LEFT WITHOUT SIGNING

## 2017-04-28 NOTE — ED Notes (Addendum)
Pt visibly agitated she is not able to eat. Despite comforting pt and explaining we can check her blood sugar pt states "I need juice. I'm gonna fall dead if I don't eat. I haven't eaten all day. You tell that doctor I want something now." Admitting at the bedside

## 2017-04-28 NOTE — H&P (Signed)
History and Physical    [name] ZOX:[id number] DOB: [birth year] DOA: 04/28/2017  PCP: [name], MD  Patient coming from: Home  I have personally briefly reviewed patient's old medical records in [place]  Chief Complaint: Back pain radiating to the chest with relief with nitroglycerin which began at 4 AM waking her up out of sleep  HPI: [name] is a 76 y.o. female with medical history significant  for DM, HTN, CAD, CVA, and tobacco abuse (40 pack year history) who presented to the ED with acute onset chest pain. She states that this morning she woke up at 4AM with chest pain around her bra line that was worsened with talking and exertion.  She states the pain begins in her back and radiates around to her front.  She was nauseous and experienced 1 episode on non-bloody emesis. She endorse diaphoresis and recent upper respiratory tract infection symptoms including cough. She does not think it is related to her heart but is unable to recall what her chest pain felt like during her STEMI. She acknowledges that she did have some similar pain yesterday that was relieved by nitroglycerin. She wanted to come get evaluated to make sure this is not related to her heart. She thinks it is a pulled muscle. She endorses increased LE swelling and orthopnea. Prior echocardiogram in 2016 did show EF of 45-50% with G1DD.   She also expresses concern about some bilateral hip and lower back pain. States that she was out shopping yesterday and started to have pain in her hips and back. This urged her to go home and relax. She doesn't recall having anything like this before. It has improved slightly with rest.   Endorses arthralgias from "arthritis." Denies HA, visual changes, SOB, palpitations, abdominal pain, diarrhea/constipation, dysuria, polyuria, myalgias.   Additionally and quite significantly her daughter died of heart failure and multisystem organ failure in March and then a son who  had had some stable cancer died suddenly in 01-26-23.  Since the death of her son the patient has had anorexia and has lost 40 ounce in weight.  When discussing this she became very tearful department.  She says she does not know why she is still around.  She is upset that 2 of her children were taken from her and that she is still here.  She does not have any suicidal ideation or plan.  She endorses significant grief.  She states that she just needs to get a little bit of rest.  Since her son has passed away she is now the primary caretaker for her daughter-in-law who is deaf and suffered a stroke and requires significant assistance.   ED Course: He received 1 sublingual nitroglycerin with complete resolution of her chest pain.  Her d-dimer was noted to be elevated and a CTA of the chest did not show any significant findings.  She was noted to have a left leg that is larger than her right leg.  She states it chronically like that since she had surgery on her veins years ago.  Review of Systems: As per HPI otherwise 10 point review of systems negative.   ROS Past Medical History:  Diagnosis Date  . AAA (abdominal aortic aneurysm) ([place])   . Aneurysm ([place])   . Arthritis    osteoarthritis  . Constipation   . Diabetes mellitus    Type 2, on Glimiperide  . GERD (gastroesophageal reflux disease)   . Glaucoma   . Hyperlipidemia   .  Hypertension   . Kidney stones   . Myocardial infarction ([place])   . Peripheral vascular disease ([place])   . Pneumonia   . Stroke ([place]) 01/2015   with left side weakness   . TIA (transient ischemic attack)   . Urinary incontinence     Past Surgical History:  Procedure Laterality Date  . ABDOMINAL AORTAGRAM N/A 03/13/2014   Procedure: ABDOMINAL Maxcine Ham;  Surgeon: Elam Dutch, MD;  Location[facility] C[facility] LAB;  Service: Cardiovascular;  Laterality: N/A;  . ABDOMINAL AORTIC ANEURYSM REPAIR    . ABDOMINAL AORTIC ANEURYSM REPAIR  ?2012  . ABDOMINAL HYSTERECTOMY      partial  . BACK SURGERY    . CARDIAC CATHETERIZATION    . CORONARY ANGIOPLASTY  03/2015  . EYE SURGERY Right    laser surgery for blood behind eye, loss of sight  . FEMORAL-POPLITEAL BYPASS GRAFT Left 07/06/2014   Procedure: BYPASS GRAFT FEMORAL-POPLITEAL ARTERY;  Surgeon: Elam Dutch, MD;  Location: [place];  Service: Vascular;  Laterality: Left;  . HERNIA REPAIR    . JOINT REPLACEMENT    . LEFT HEART CATHETERIZATION WITH CORONARY ANGIOGRAM N/A 04/12/2014   Procedure: LEFT HEART CATHETERIZATION WITH CORONARY ANGIOGRAM;  Surgeon: Clent Demark, MD;  Location: [place] CATH LAB;  Service: Cardiovascular;  Laterality: N/A;  . PERCUTANEOUS CORONARY STENT INTERVENTION (PCI-S)  04/12/2014   Procedure: PERCUTANEOUS CORONARY STENT INTERVENTION (PCI-S);  Surgeon: Clent Demark, MD;  Location: [facility] CATH LAB;  Service: Cardiovascular;;  prox and mid RCA  . TOTAL HIP ARTHROPLASTY Left 05/18/2015  . TOTAL HIP ARTHROPLASTY Left 05/18/2015   Procedure: LEFT TOTAL HIP ARTHROPLASTY ANTERIOR APPROACH;  Surgeon: Mcarthur Rossetti, MD;  Location: [place];  Service: Orthopedics;  Laterality: Left;  . TOTAL KNEE ARTHROPLASTY Left ~ 2003  . TUBAL LIGATION    . uterine tumor    . VENTRAL HERNIA REPAIR       reports that she has been smoking cigarettes.  She has a 20.00 pack-year smoking history. she has never used smokeless tobacco. She reports that she does not drink alcohol or use drugs.  Allergies  Allergen Reactions  . Keflex [Cephalexin] Shortness Of Breath and Other (See Comments)    Chest pain, headache  . Codeine Other (See Comments)    Knocked her out of equilibrium. Made her world flip upside down  . Lipitor [Atorvastatin] Other (See Comments)    Severe pain/cramping in legs  . Azor [Amlodipine-Olmesartan] Swelling, Palpitations and Rash    Amlodipine alone resumed 12/2013, tolerating  . Lisinopril Cough  . Penicillins Other (See Comments)    Makes exzcema worse    Family History  Problem  Relation Age of Onset  . Diabetes Mother   . Stroke Mother   . Hypertension Father   . Stroke Father   . Hyperlipidemia Sister   . Hypertension Sister   . Aneurysm Sister   . Hyperlipidemia Brother   . Hypertension Brother      Prior to Admission medications   Medication Sig Start Date End Date Taking? Authorizing Provider  acetaminophen (TYLENOL) 325 MG tablet Take 2 tablets (650 mg total) by mouth every 6 (six) hours as needed for mild pain or moderate pain (or Fever >/= 101). 05/21/15  Yes Mcarthur Rossetti, MD  albuterol (PROVENTIL HFA;VENTOLIN HFA) 108 (90 Base) MCG/ACT inhaler Inhale 2 puffs into the lungs every 4 (four) hours as needed for wheezing or shortness of breath (or coughing). 6/37/85  Yes Delora Fuel, MD  amLODipine (NORVASC) 5 MG tablet Take 5 mg by mouth 2 (two) times daily.    Yes [provider]  aspirin EC 81 MG tablet Take 81 mg by mouth daily.   Yes [provider]  glimepiride (AMARYL) 2 MG tablet Take 2-3 mg by mouth 2 (two) times daily. Take 3 mg by mouth in the morning and take 2 mg by mouth in the afternoon (around 6 pm)   Yes [provider]  isosorbide mononitrate (IMDUR) 60 MG 24 hr tablet Take 60 mg by mouth daily. 02/24/15  Yes [provider]  metoprolol (TOPROL-XL) 50 MG 24 hr tablet Take 50 mg by mouth daily.    Yes [provider]  ticagrelor (BRILINTA) 90 MG TABS tablet Take 1 tablet (90 mg total) by mouth 2 (two) times daily. 04/15/14  Yes [name], MD  vitamin B-12 (CYANOCOBALAMIN) 50 MCG tablet Take 50 mcg by mouth daily.   Yes [provider]  methocarbamol (ROBAXIN) 500 MG tablet Take 1 tablet (500 mg total) by mouth every 6 (six) hours as needed for muscle spasms. Patient not taking: Reported on 06/08/2016 05/21/15   Mcarthur Rossetti, MD  nitroGLYCERIN (NITROSTAT) 0.4 MG SL tablet Place 0.4 mg under the tongue every 5 (five) minutes as needed for chest pain.    [provider]    Physical Exam: Vitals:   04/28/17 1345 04/28/17 1400 04/28/17 1515 04/28/17 1545  BP: (!) 152/59 (!) 177/71 (!) 162/73 (!) 136/49  Pulse: (!) 49 (!) 58 (!) 51 (!) 53  Resp: '14 17 17 12  ' Temp:      TempSrc:      SpO2: 97% 97% 95% 91%    Constitutional: NAD, calm, comfortable, tearful on occasion Vitals:   04/28/17 1345 04/28/17 1400 04/28/17 1515 04/28/17 1545  BP: (!) 152/59 (!) 177/71 (!) 162/73 (!) 136/49  Pulse: (!) 49 (!) 58 (!) 51 (!) 53  Resp: '14 17 17 12  ' Temp:      TempSrc:      SpO2: 97% 97% 95% 91%   Eyes: PERRL, lids and conjunctivae normal ENMT: Mucous membranes are moist. Posterior pharynx clear of any exudate or lesions.Normal dentition.  Neck: normal, supple, no masses, no thyromegaly Respiratory: clear to auscultation bilaterally, no wheezing, no crackles. Normal respiratory effort. No accessory muscle use.  Cardiovascular: Regular rate and rhythm, no murmurs / rubs / gallops. No extremity edema. 2+ pedal pulses. No carotid bruits.  Abdomen: no tenderness, no masses palpated. No hepatosplenomegaly. Bowel sounds positive.  Musculoskeletal: no clubbing / cyanosis. No joint deformity upper and lower extremities. Good ROM, no contractures. Normal muscle tone.  Skin: no rashes, lesions, ulcers. No induration Neurologic: CN 2-12 grossly intact. Sensation intact, DTR normal. Strength 5/5 in all 4.  Psychiatric: Normal judgment and insight. Alert and oriented x 3.  Depressed mood with tearful affect   Labs on Admission: I have personally reviewed following labs and imaging studies  CBC: Recent Labs  Lab 04/28/17 1149  WBC 7.8  NEUTROABS 5.8  HGB 13.2  HCT 39.9  MCV 86.6  PLT 401   Basic Metabolic Panel: Recent Labs  Lab 04/28/17 1149  NA 136  K 4.1  CL 104  CO2 24  GLUCOSE 170*  BUN 15  CREATININE 1.17*  CALCIUM 9.2   GFR: CrCl cannot be calculated (Unknown ideal weight.). Liver Function Tests: Recent Labs  Lab 04/28/17 1149   AST 16  ALT 11*  ALKPHOS 64  BILITOT  0.5  PROT 7.8  ALBUMIN 3.4*   No results for input(s): LIPASE, AMYLASE in the last 168 hours. No results for input(s): AMMONIA in the last 168 hours. Coagulation Profile: No results for input(s): INR, PROTIME in the last 168 hours. Cardiac Enzymes: No results for input(s): CKTOTAL, CKMB, CKMBINDEX, TROPONINI in the last 168 hours. BNP (last 3 results) No results for input(s): PROBNP in the last 8760 hours. HbA1C: No results for input(s): HGBA1C in the last 72 hours. CBG: No results for input(s): GLUCAP in the last 168 hours. Lipid Profile: No results for input(s): CHOL, HDL, LDLCALC, TRIG, CHOLHDL, LDLDIRECT in the last 72 hours. Thyroid Function Tests: No results for input(s): TSH, T4TOTAL, FREET4, T3FREE, THYROIDAB in the last 72 hours. Anemia Panel: No results for input(s): VITAMINB12, FOLATE, FERRITIN, TIBC, IRON, RETICCTPCT in the last 72 hours. Urine analysis:    Component Value Date/Time   COLORURINE RED (A) 09/03/2015 2312   APPEARANCEUR TURBID (A) 09/03/2015 2312   LABSPEC 1.011 09/03/2015 2312   PHURINE 5.5 09/03/2015 2312   GLUCOSEU NEGATIVE 09/03/2015 2312   HGBUR LARGE (A) 09/03/2015 2312   BILIRUBINUR NEGATIVE 09/03/2015 2312   KETONESUR 15 (A) 09/03/2015 2312   PROTEINUR 100 (A) 09/03/2015 2312   UROBILINOGEN 0.2 07/04/2014 0319   NITRITE NEGATIVE 09/03/2015 2312   LEUKOCYTESUR LARGE (A) 09/03/2015 2312    Radiological Exams on Admission: Dg Chest 1 View  Result Date: 04/28/2017 CLINICAL DATA:  Chest pain, back pain EXAM: CHEST 1 VIEW COMPARISON:  04/27/2016 FINDINGS: Vascular congestion. Heart is borderline in size. No confluent opacities or effusions. No acute bony abnormality. IMPRESSION: Vascular congestion. Electronically Signed   By: Rolm Baptise M.D.   On: 04/28/2017 12:20   Ct Angio Chest Pe W/cm &/or Wo Cm  Result Date: 04/28/2017 CLINICAL DATA:  Thoracic and lumbar pain.  Elevated D-dimer. EXAM: CT  ANGIOGRAPHY CHEST WITH CONTRAST TECHNIQUE: Multidetector CT imaging of the chest was performed using the standard protocol during bolus administration of intravenous contrast. Multiplanar CT image reconstructions and MIPs were obtained to evaluate the vascular anatomy. CONTRAST:  63m ISOVUE-370 IOPAMIDOL (ISOVUE-370) INJECTION 76% COMPARISON:  Chest x-ray April 28, 2017. Chest CT April 27, 2016. FINDINGS: Cardiovascular: The thoracic aorta is not well assessed due to timing of contrast. 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 No other aneurysmal dilatation. Atherosclerotic changes are identified. Evaluation for dissection is limited but none is seen. Coronary artery calcifications are again identified. The heart is borderline but stable. No pulmonary emboli. Mediastinum/Nodes: The thyroid and esophagus are normal. Prominent AP window nodes are seen on series 5, image 49, not changed since 2015 and of doubtful acute significance. No new adenopathy in the mediastinum or hila. No other adenopathy in the chest. No effusions. Lungs/Pleura: The central airways are normal. No pneumothorax. Emphysematous changes in the lungs persists. The 7 mm nodule in the right upper lobe posteriorly on series 6, image 37 is unchanged since 2015. No other nodules, masses, or infiltrates. Upper Abdomen: No acute abnormality. Musculoskeletal: Degenerative changes in the thoracic spine with no obvious fracture or malalignment. Review of the MIP images confirms the above findings. IMPRESSION: 1. No pulmonary embolus. 2. Atherosclerotic change in the thoracic aorta. 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 3. Emphysematous changes in the lungs. Stable nodule  in the right apex for 3 years. 4. Coronary artery calcified atherosclerosis. 5.  Prominent AP window nodes, unchanged since 2015, likely reactive. 6. No other abnormalities. Aortic Atherosclerosis (ICD10-I70.0) and Emphysema (ICD10-J43.9). Electronically Signed   By: Dorise Bullion III M.D   On: 04/28/2017 14:22    EKG: Independently reviewed.  Shows sinus bradycardia with left ventricular hypertrophy  Assessment/Plan Principal Problem:   Chest pain Active Problems:   Grief reaction   Hypertension   DM (diabetes mellitus) type II uncontrolled, periph vascular disorder (HCC)   Atherosclerosis of native arteries of extremity with intermittent claudication (HCC)   Hyperlipidemia   History of stroke   History of ST elevation myocardial infarction (STEMI)    1.  Chest pain: Patient does have a history of coronary artery disease we will bring her into the hospital and rule her out for cardial infarction with serial enzymes and EKGs.  I do not believe that she will require a stress test at this point.  If she rules out I think an outpatient follow-up appointment with Dr. Bayard Males her cardiologist would be appropriate.  At that point he can determine necessity for stress testing.  I do think the patient has significant amount of chest pain related to musculoskeletal issue from stress and strain.  Stress being the severe grief reaction from the death of her 2 children this year and it being the holiday season that just makes things much worse for her.  And strain because she states she has had muscular strain related to a problem with her bed requiring her to prop herself up on some pillows.  Should she rule out for myocardial infarction I would recommend discharge home in the a.m. with follow-up with Dr. Bayard Males her cardiologist.  2.  Grief reaction: Patient very obviously with a severe grief reaction to the death of HER-2 children.  She is seeking assistance through [facility] grief counseling.  She has an appointment to see them.  Given the amount of anxiety I think the  patient would benefit from a low-dose benzodiazepine.  Added clonazepam at 0.5 mg p.o. every 12 hours.  If this is too sedating we could discharge her on a dose of 0.25 mg p.o. every 12 hours.  I would recommend discharging her with a 10-day course of treatment for her to follow-up with her primary care physician for further evaluation and guidance.  As it is less than 6 months from the death of her son I do not think that SSRIs at this point would be of great benefit but will leave that decision up to her primary care physician.  3.  Hypertension continue home medications.  4.  Diabetes type 2 with peripheral vascular disease: We will continue her home medications and start sliding scale insulin coverage.  5.  Atherosclerosis of native arteries of extremity with intermittent claudication: Noted continue home antiplatelet medication.  6.  Hyperlipidemia continue home medications.  7.  History of stroke: Increases her risk for myocardial infarction.  8.  History of ST elevation myocardial infarction: Chest pain is not described as similar to her MI however this does increase her risk.  CODE STATUS full code  DVT prophylaxis: Lovenox Code Status: Full Family Communication: Spoke with patient and her daughter who is present in the room all questions answered. Disposition Plan: Likely home in a.m. Consults called: None patient to follow-up with Dr. Bayard Males primary cardiologist Admission status: Observation   Lady Deutscher MD [facility] Pager [phone] If 7PM-7AM, please contact night-coverage [email or web address] Password [facility]  04/28/2017, 3:56 PM

## 2017-05-04 ENCOUNTER — Other Ambulatory Visit: Payer: Self-pay | Admitting: Cardiology

## 2017-05-04 DIAGNOSIS — E119 Type 2 diabetes mellitus without complications: Secondary | ICD-10-CM | POA: Diagnosis not present

## 2017-05-04 DIAGNOSIS — F17290 Nicotine dependence, other tobacco product, uncomplicated: Secondary | ICD-10-CM | POA: Diagnosis not present

## 2017-05-04 DIAGNOSIS — I10 Essential (primary) hypertension: Secondary | ICD-10-CM | POA: Diagnosis not present

## 2017-05-04 DIAGNOSIS — I639 Cerebral infarction, unspecified: Secondary | ICD-10-CM | POA: Diagnosis not present

## 2017-05-04 DIAGNOSIS — R079 Chest pain, unspecified: Secondary | ICD-10-CM

## 2017-05-04 DIAGNOSIS — I739 Peripheral vascular disease, unspecified: Secondary | ICD-10-CM | POA: Diagnosis not present

## 2017-05-04 DIAGNOSIS — I714 Abdominal aortic aneurysm, without rupture: Secondary | ICD-10-CM | POA: Diagnosis not present

## 2017-05-04 DIAGNOSIS — I252 Old myocardial infarction: Secondary | ICD-10-CM | POA: Diagnosis not present

## 2017-05-04 DIAGNOSIS — I25110 Atherosclerotic heart disease of native coronary artery with unstable angina pectoris: Secondary | ICD-10-CM | POA: Diagnosis not present

## 2017-05-04 DIAGNOSIS — E785 Hyperlipidemia, unspecified: Secondary | ICD-10-CM | POA: Diagnosis not present

## 2017-05-11 ENCOUNTER — Ambulatory Visit (HOSPITAL_COMMUNITY): Payer: Medicare HMO

## 2017-05-14 ENCOUNTER — Encounter (HOSPITAL_COMMUNITY): Payer: Self-pay

## 2017-05-14 ENCOUNTER — Ambulatory Visit (HOSPITAL_COMMUNITY)
Admission: RE | Admit: 2017-05-14 | Discharge: 2017-05-14 | Disposition: A | Discharge status: Home / Self Care | Payer: Medicare HMO | Source: Ambulatory Visit | Attending: Cardiology | Admitting: Cardiology

## 2017-06-14 ENCOUNTER — Ambulatory Visit: Payer: Medicare HMO | Admitting: Vascular Surgery

## 2017-06-14 ENCOUNTER — Ambulatory Visit (HOSPITAL_COMMUNITY)
Admission: RE | Admit: 2017-06-14 | Discharge: 2017-06-14 | Disposition: A | Discharge status: Home / Self Care | Payer: Medicare HMO | Source: Ambulatory Visit | Attending: Vascular Surgery | Admitting: Vascular Surgery

## 2017-06-14 ENCOUNTER — Ambulatory Visit (INDEPENDENT_AMBULATORY_CARE_PROVIDER_SITE_OTHER)
Admission: RE | Admit: 2017-06-14 | Discharge: 2017-06-14 | Disposition: A | Discharge status: Home / Self Care | Payer: Medicare HMO | Source: Ambulatory Visit | Attending: Vascular Surgery | Admitting: Vascular Surgery

## 2017-06-14 ENCOUNTER — Encounter: Payer: Self-pay | Admitting: Vascular Surgery

## 2017-06-14 VITALS — BP 146/70 | HR 60 | Resp 18 | Ht 67.5 in | Wt 168.0 lb

## 2017-06-14 DIAGNOSIS — I714 Abdominal aortic aneurysm, without rupture, unspecified: Secondary | ICD-10-CM

## 2017-06-14 DIAGNOSIS — I739 Peripheral vascular disease, unspecified: Secondary | ICD-10-CM | POA: Diagnosis not present

## 2017-06-14 DIAGNOSIS — F17200 Nicotine dependence, unspecified, uncomplicated: Secondary | ICD-10-CM | POA: Diagnosis not present

## 2017-06-14 DIAGNOSIS — E1151 Type 2 diabetes mellitus with diabetic peripheral angiopathy without gangrene: Secondary | ICD-10-CM | POA: Diagnosis not present

## 2017-06-14 DIAGNOSIS — Z95828 Presence of other vascular implants and grafts: Secondary | ICD-10-CM | POA: Diagnosis not present

## 2017-06-14 NOTE — [place] Notes (Signed)
Patient is a 77 year old female who returns for follow-up today.  She has previously undergone a left femoral to below-knee popliteal bypass with vein February 2016.  This was done for a nonhealing ulcer on the left first toe.  She has had no further problems with the left leg.  She has no claudication symptoms.  She had one vessel runoff at the time of completion arteriogram.  She has also previously undergone abdominal aortic aneurysm repair in 2007.  She also has a known aberrant left subclavian artery but she did not wish further intervention on this.  Other chronic medical problems remain arthritis diabetes hyperlipidemia hypertension coronary artery disease all of which have been stable.  Review of systems: She denies shortness of breath.  She denies chest pain.  Past Medical History:  Diagnosis Date  . AAA (abdominal aortic aneurysm) ([place])   . Aneurysm ([place])   . Arthritis    osteoarthritis  . Constipation   . Diabetes mellitus    Type 2, on Glimiperide  . GERD (gastroesophageal reflux disease)   . Glaucoma   . Hyperlipidemia   . Hypertension   . Kidney stones   . Myocardial infarction ([place])   . Peripheral vascular disease ([place])   . Pneumonia   . Stroke ([place]) 01/2015   with left side weakness   . TIA (transient ischemic attack)   . Urinary incontinence    Past Surgical History:  Procedure Laterality Date  . ABDOMINAL AORTAGRAM N/A 03/13/2014   Procedure: ABDOMINAL Maxcine Ham;  Surgeon: Elam Dutch, MD;  Location: [facility] CATH LAB;  Service: Cardiovascular;  Laterality: N/A;  . ABDOMINAL AORTIC ANEURYSM REPAIR    . ABDOMINAL AORTIC ANEURYSM REPAIR  ?2012  . ABDOMINAL HYSTERECTOMY     partial  . BACK SURGERY    . CARDIAC CATHETERIZATION    . CORONARY ANGIOPLASTY  03/2015  . EYE SURGERY Right    laser surgery for blood behind eye, loss of sight  . FEMORAL-POPLITEAL BYPASS GRAFT Left 07/06/2014   Procedure: BYPASS GRAFT FEMORAL-POPLITEAL ARTERY;  Surgeon: Elam Dutch,  MD;  Location: [place];  Service: Vascular;  Laterality: Left;  . HERNIA REPAIR    . JOINT REPLACEMENT    . LEFT HEART CATHETERIZATION WITH CORONARY ANGIOGRAM N/A 04/12/2014   Procedure: LEFT HEART CATHETERIZATION WITH CORONARY ANGIOGRAM;  Surgeon: Clent Demark, MD;  Location: [place] CATH LAB;  Service: Cardiovascular;  Laterality: N/A;  . PERCUTANEOUS CORONARY STENT INTERVENTION (PCI-S)  04/12/2014   Procedure: PERCUTANEOUS CORONARY STENT INTERVENTION (PCI-S);  Surgeon: Clent Demark, MD;  Location: [facility] CATH LAB;  Service: Cardiovascular;;  prox and mid RCA  . TOTAL HIP ARTHROPLASTY Left 05/18/2015  . TOTAL HIP ARTHROPLASTY Left 05/18/2015   Procedure: LEFT TOTAL HIP ARTHROPLASTY ANTERIOR APPROACH;  Surgeon: Mcarthur Rossetti, MD;  Location: [place];  Service: Orthopedics;  Laterality: Left;  . TOTAL KNEE ARTHROPLASTY Left ~ 2003  . TUBAL LIGATION    . uterine tumor    . VENTRAL HERNIA REPAIR      Current Outpatient Medications on File Prior to Visit  Medication Sig Dispense Refill  . acetaminophen (TYLENOL) 325 MG tablet Take 2 tablets (650 mg total) by mouth every 6 (six) hours as needed for mild pain or moderate pain (or Fever >/= 101). 60 tablet 0  . amLODipine (NORVASC) 5 MG tablet Take 5 mg by mouth 2 (two) times daily.     Marland Kitchen aspirin EC 81 MG tablet Take 81 mg by mouth daily.    Marland Kitchen  glimepiride (AMARYL) 2 MG tablet Take 2-3 mg by mouth 2 (two) times daily. Take 3 mg by mouth in the morning and take 2 mg by mouth in the afternoon (around 6 pm)    . isosorbide mononitrate (IMDUR) 60 MG 24 hr tablet Take 60 mg by mouth daily.  3  . methocarbamol (ROBAXIN) 500 MG tablet Take 1 tablet (500 mg total) by mouth every 6 (six) hours as needed for muscle spasms. 60 tablet 0  . metoprolol (TOPROL-XL) 50 MG 24 hr tablet Take 50 mg by mouth daily.     . nitroGLYCERIN (NITROSTAT) 0.4 MG SL tablet Place 0.4 mg under the tongue every 5 (five) minutes as needed for chest pain.    . ticagrelor  (BRILINTA) 90 MG TABS tablet Take 1 tablet (90 mg total) by mouth 2 (two) times daily. 60 tablet 11  . vitamin B-12 (CYANOCOBALAMIN) 50 MCG tablet Take 50 mcg by mouth daily.    Marland Kitchen albuterol (PROVENTIL HFA;VENTOLIN HFA) 108 (90 Base) MCG/ACT inhaler Inhale 2 puffs into the lungs every 4 (four) hours as needed for wheezing or shortness of breath (or coughing). (Patient not taking: Reported on 06/14/2017) 1 Inhaler 0   No current facility-administered medications on file prior to visit.      Physical exam:  Vitals:   06/14/17 1041 06/14/17 1043  BP: (!) 149/72 (!) 146/70  Pulse: 60   Resp: 18   SpO2: 100%   Weight: 168 lb (76.2 kg)   Height: 5' 7.5" (1.715 m)     Abdomen: Soft nontender nondistended no pulsatile mass  Extremities: No palpable pedal pulses  Neck: No carotid bruits  Chest: Clear to auscultation  Cardiac: Regular rate and rhythm without murmur  Data: Patient had duplex ultrasound which showed her bypass graft is patent.  ABI on the left was 0.89 right was 0.5  Assessment: #1 abdominal aortic aneurysm repair in the remote past.  2.  Peripheral arterial disease patent left femoral to below-knee popliteal bypass with no claudication symptoms in the right leg or left leg continue current care repeat ABIs and graft duplex in 1 year.  She will see our nurse practitioner at that office visit.  3.  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  Ruta Hinds, MD [facility] Office: [phone] Pager: [phone]

## 2017-07-18 DIAGNOSIS — Z0000 Encounter for general adult medical examination without abnormal findings: Secondary | ICD-10-CM | POA: Diagnosis not present

## 2017-07-18 DIAGNOSIS — E1121 Type 2 diabetes mellitus with diabetic nephropathy: Secondary | ICD-10-CM | POA: Diagnosis not present

## 2017-07-18 DIAGNOSIS — R042 Hemoptysis: Secondary | ICD-10-CM | POA: Diagnosis not present

## 2017-07-18 DIAGNOSIS — Z79899 Other long term (current) drug therapy: Secondary | ICD-10-CM | POA: Diagnosis not present

## 2017-07-18 DIAGNOSIS — E11311 Type 2 diabetes mellitus with unspecified diabetic retinopathy with macular edema: Secondary | ICD-10-CM | POA: Diagnosis not present

## 2017-07-18 DIAGNOSIS — I2510 Atherosclerotic heart disease of native coronary artery without angina pectoris: Secondary | ICD-10-CM | POA: Diagnosis not present

## 2017-07-18 DIAGNOSIS — J439 Emphysema, unspecified: Secondary | ICD-10-CM | POA: Diagnosis not present

## 2017-07-18 DIAGNOSIS — I714 Abdominal aortic aneurysm, without rupture: Secondary | ICD-10-CM | POA: Diagnosis not present

## 2017-07-18 DIAGNOSIS — I63311 Cerebral infarction due to thrombosis of right middle cerebral artery: Secondary | ICD-10-CM | POA: Diagnosis not present

## 2017-07-18 DIAGNOSIS — E1165 Type 2 diabetes mellitus with hyperglycemia: Secondary | ICD-10-CM | POA: Diagnosis not present

## 2017-07-18 DIAGNOSIS — E559 Vitamin D deficiency, unspecified: Secondary | ICD-10-CM | POA: Diagnosis not present

## 2017-07-18 DIAGNOSIS — E785 Hyperlipidemia, unspecified: Secondary | ICD-10-CM | POA: Diagnosis not present

## 2017-07-18 DIAGNOSIS — I6529 Occlusion and stenosis of unspecified carotid artery: Secondary | ICD-10-CM | POA: Diagnosis not present

## 2018-01-17 DIAGNOSIS — R39198 Other difficulties with micturition: Secondary | ICD-10-CM | POA: Diagnosis not present

## 2018-01-17 DIAGNOSIS — G720 Drug-induced myopathy: Secondary | ICD-10-CM | POA: Diagnosis not present

## 2018-01-17 DIAGNOSIS — E1121 Type 2 diabetes mellitus with diabetic nephropathy: Secondary | ICD-10-CM | POA: Diagnosis not present

## 2018-01-17 DIAGNOSIS — F322 Major depressive disorder, single episode, severe without psychotic features: Secondary | ICD-10-CM | POA: Diagnosis not present

## 2018-01-17 DIAGNOSIS — E7800 Pure hypercholesterolemia, unspecified: Secondary | ICD-10-CM | POA: Diagnosis not present

## 2018-01-21 DIAGNOSIS — I252 Old myocardial infarction: Secondary | ICD-10-CM | POA: Diagnosis not present

## 2018-01-21 DIAGNOSIS — I714 Abdominal aortic aneurysm, without rupture: Secondary | ICD-10-CM | POA: Diagnosis not present

## 2018-01-21 DIAGNOSIS — E119 Type 2 diabetes mellitus without complications: Secondary | ICD-10-CM | POA: Diagnosis not present

## 2018-01-21 DIAGNOSIS — I10 Essential (primary) hypertension: Secondary | ICD-10-CM | POA: Diagnosis not present

## 2018-01-21 DIAGNOSIS — Z8673 Personal history of transient ischemic attack (TIA), and cerebral infarction without residual deficits: Secondary | ICD-10-CM | POA: Diagnosis not present

## 2018-01-21 DIAGNOSIS — F419 Anxiety disorder, unspecified: Secondary | ICD-10-CM | POA: Diagnosis not present

## 2018-01-21 DIAGNOSIS — I25118 Atherosclerotic heart disease of native coronary artery with other forms of angina pectoris: Secondary | ICD-10-CM | POA: Diagnosis not present

## 2018-01-21 DIAGNOSIS — R0789 Other chest pain: Secondary | ICD-10-CM | POA: Diagnosis not present

## 2018-01-21 DIAGNOSIS — I739 Peripheral vascular disease, unspecified: Secondary | ICD-10-CM | POA: Diagnosis not present

## 2018-02-08 ENCOUNTER — Ambulatory Visit (INDEPENDENT_AMBULATORY_CARE_PROVIDER_SITE_OTHER): Payer: Medicare HMO

## 2018-02-08 ENCOUNTER — Ambulatory Visit (INDEPENDENT_AMBULATORY_CARE_PROVIDER_SITE_OTHER): Payer: Commercial Managed Care - HMO | Admitting: Family Medicine

## 2018-02-08 ENCOUNTER — Encounter (INDEPENDENT_AMBULATORY_CARE_PROVIDER_SITE_OTHER): Payer: Self-pay | Admitting: Family Medicine

## 2018-02-08 DIAGNOSIS — M25552 Pain in left hip: Secondary | ICD-10-CM | POA: Diagnosis not present

## 2018-02-08 DIAGNOSIS — M25551 Pain in right hip: Secondary | ICD-10-CM | POA: Diagnosis not present

## 2018-02-08 DIAGNOSIS — M5441 Lumbago with sciatica, right side: Secondary | ICD-10-CM

## 2018-02-08 MED ORDER — BACLOFEN 10 MG PO TABS: 5.0000 mg | ORAL_TABLET | Freq: Three times a day (TID) | ORAL | 3 refills | Status: DC | PRN

## 2018-02-08 NOTE — Progress Notes (Signed)
Office Visit Note   Patient: [name]           Date of Birth: 06/16/1940           MRN: [id number] Visit Date: 02/08/2018 Requested by: [name], MD [address] [place], [place] [address] PCP: [name], MD  Subjective: Chief Complaint  Patient presents with  . right hip pain, shoots down leg x 2 months  . s/p fall on the right hip approx 2 months ago    HPI: She is here with right greater than left hip pain, low back and right sided sciatica pain.  Symptoms started yesterday without injury.  In the afternoon she developed severe pain radiating from the right posterior hip into the groin area and down to her foot with a numbness and tingling sensation.  She took some Tylenol but she did not think it made much difference.  After sleeping last night, she feels somewhat better today but she would like x-rays.  She has a history of left hip replacement a couple years ago per Dr. Ninfa Linden.  She did well after that, but has developed some swelling on the lateral hip in the past 5 or 6 months.  She has a history of spine surgery per Dr. Maryjean Ka a couple years ago for a right sided disc protrusion.              ROS: She has a history of vascular disease, hypertension and diabetes.  Other systems were negative.  Objective: Vital Signs: There were no vitals taken for this visit.  Physical Exam:  Back: No visible rash.  Slightly tender in the right sciatic notch.  Straight leg raise negative, lower extremity strength and reflexes are normal. Right hip: Good range of motion and minimal pain with passive flexion and internal rotation. Left hip: There is some soft tissue prominence, no warmth or erythema on the lateral aspect.  Surgical scar well-healed.  Imaging: Pelvis and hip x-rays: Intact left hip prosthesis with no sign of loosening.  Right hip has mild degenerative change but still a good joint space.  Lumbar spine x-rays: Moderate to severe facet joint  arthropathy diffusely.  L4-5 anterolisthesis unchanged from previous MRI scan.  No sign of compression fracture.   Assessment & Plan: 1.  Right posterior hip and leg pain, etiology uncertain but letter today. -Baclofen as needed if it flares up again.  Could contemplate lumbar MRI scan or nerve studies in the future if fails to improve with conservative management.  Physical therapy would be an option if she has a flareup that does not improve quickly.  2.  Left hip soft tissue swelling -Patient wants to come in to see Dr. Ninfa Linden for reassurance regarding this.   Follow-Up Instructions: No follow-ups on file.       Procedures: None today.   PMFS History: Patient Active Problem List   Diagnosis Date Noted  . Chest pain 04/28/2017  . History of ST elevation myocardial infarction (STEMI) 04/28/2017  . Grief reaction 04/28/2017  . Osteoarthritis of left hip 05/18/2015  . Status post total replacement of left hip 05/18/2015  . Coronary artery disease involving native coronary artery of native heart with angina pectoris ([place]) 04/16/2015  . History of stroke 04/16/2015  . Type 2 diabetes mellitus with circulatory disorder ([place]) 04/16/2015  . Cerebrovascular 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 artery ([place]) 04/16/2015  . HLD (hyperlipidemia)   . Acute CVA (cerebrovascular accident) ([place])  02/19/2015  . Transient ischemic attack (TIA) 02/18/2015  . Stroke ([place])   . Cerebral infarction due to thrombosis of right middle cerebral artery ([place])   . Acute myocardial infarction of inferolateral wall (HCC) 04/12/2014  . Numbness 03/18/2014  . TIA (transient ischemic attack) 03/18/2014  . DM (diabetes mellitus) type II uncontrolled, periph vascular disorder ([place]) 03/18/2014  . Hyperlipidemia   . Hypertension   . PVD (peripheral vascular disease) ([place]) 03/05/2014  . Transient cerebral ischemia 08/01/2012  . Atherosclerosis of native arteries of extremity with  intermittent claudication ([place]) 10/19/2011  . Unspecified disorders of arteries and arterioles 10/19/2011  . PAD (peripheral artery disease) ([place]) 04/13/2011   Past Medical History:  Diagnosis Date  . AAA (abdominal aortic aneurysm) ([place])   . Aneurysm ([place])   . Arthritis    osteoarthritis  . Constipation   . Diabetes mellitus    Type 2, on Glimiperide  . GERD (gastroesophageal reflux disease)   . Glaucoma   . Hyperlipidemia   . Hypertension   . Kidney stones   . Myocardial infarction ([place])   . Peripheral vascular disease ([place])   . Pneumonia   . Stroke ([place]) 01/2015   with left side weakness   . TIA (transient ischemic attack)   . Urinary incontinence     Family History  Problem Relation Age of Onset  . Diabetes Mother   . Stroke Mother   . Hypertension Father   . Stroke Father   . Hyperlipidemia Sister   . Hypertension Sister   . Aneurysm Sister   . Hyperlipidemia Brother   . Hypertension Brother     Past Surgical History:  Procedure Laterality Date  . ABDOMINAL AORTAGRAM N/A 03/13/2014   Procedure: ABDOMINAL Maxcine Ham;  Surgeon: Elam Dutch, MD;  Location: [facility] CATH LAB;  Service: Cardiovascular;  Laterality: N/A;  . ABDOMINAL AORTIC ANEURYSM REPAIR    . ABDOMINAL AORTIC ANEURYSM REPAIR  ?2012  . ABDOMINAL HYSTERECTOMY     partial  . BACK SURGERY    . CARDIAC CATHETERIZATION    . CORONARY ANGIOPLASTY  03/2015  . EYE SURGERY Right    laser surgery for blood behind eye, loss of sight  . FEMORAL-POPLITEAL BYPASS GRAFT Left 07/06/2014   Procedure: BYPASS GRAFT FEMORAL-POPLITEAL ARTERY;  Surgeon: Elam Dutch, MD;  Location: [facility];  Service: Vascular;  Laterality: Left;  . HERNIA REPAIR    . JOINT REPLACEMENT    . LEFT HEART CATHETERIZATION WITH CORONARY ANGIOGRAM N/A 04/12/2014   Procedure: LEFT HEART CATHETERIZATION WITH CORONARY ANGIOGRAM;  Surgeon: Clent Demark, MD;  Location: [facility] CATH LAB;  Service: Cardiovascular;  Laterality: N/A;  . PERCUTANEOUS  CORONARY STENT INTERVENTION (PCI-S)  04/12/2014   Procedure: PERCUTANEOUS CORONARY STENT INTERVENTION (PCI-S);  Surgeon: Clent Demark, MD;  Location: [facility] CATH LAB;  Service: Cardiovascular;;  prox and mid RCA  . TOTAL HIP ARTHROPLASTY Left 05/18/2015  . TOTAL HIP ARTHROPLASTY Left 05/18/2015   Procedure: LEFT TOTAL HIP ARTHROPLASTY ANTERIOR APPROACH;  Surgeon: Mcarthur Rossetti, MD;  Location: [facility];  Service: Orthopedics;  Laterality: Left;  . TOTAL KNEE ARTHROPLASTY Left ~ 2003  . TUBAL LIGATION    . uterine tumor    . VENTRAL HERNIA REPAIR     Social History   Occupational History  . Not on file  Tobacco Use  . Smoking status: Current Every Day Smoker    Packs/day: 0.50    Years: 40.00    Pack years: 20.00  Types: Cigarettes  . Smokeless tobacco: Never Used  Substance and Sexual Activity  . Alcohol use: No    Alcohol/week: 0.0 standard drinks  . Drug use: No  . Sexual activity: Not on file

## 2018-02-18 ENCOUNTER — Ambulatory Visit (INDEPENDENT_AMBULATORY_CARE_PROVIDER_SITE_OTHER): Payer: Medicare HMO | Admitting: Orthopaedic Surgery

## 2018-03-20 DIAGNOSIS — F419 Anxiety disorder, unspecified: Secondary | ICD-10-CM | POA: Diagnosis not present

## 2018-03-20 DIAGNOSIS — Z8673 Personal history of transient ischemic attack (TIA), and cerebral infarction without residual deficits: Secondary | ICD-10-CM | POA: Diagnosis not present

## 2018-03-20 DIAGNOSIS — I739 Peripheral vascular disease, unspecified: Secondary | ICD-10-CM | POA: Diagnosis not present

## 2018-03-20 DIAGNOSIS — I10 Essential (primary) hypertension: Secondary | ICD-10-CM | POA: Diagnosis not present

## 2018-03-20 DIAGNOSIS — E119 Type 2 diabetes mellitus without complications: Secondary | ICD-10-CM | POA: Diagnosis not present

## 2018-03-20 DIAGNOSIS — I25110 Atherosclerotic heart disease of native coronary artery with unstable angina pectoris: Secondary | ICD-10-CM | POA: Diagnosis not present

## 2018-03-20 DIAGNOSIS — F17290 Nicotine dependence, other tobacco product, uncomplicated: Secondary | ICD-10-CM | POA: Diagnosis not present

## 2018-03-20 DIAGNOSIS — I252 Old myocardial infarction: Secondary | ICD-10-CM | POA: Diagnosis not present

## 2018-03-20 DIAGNOSIS — E785 Hyperlipidemia, unspecified: Secondary | ICD-10-CM | POA: Diagnosis not present

## 2018-03-22 ENCOUNTER — Emergency Department (HOSPITAL_COMMUNITY): Payer: Medicare HMO

## 2018-03-22 ENCOUNTER — Emergency Department (HOSPITAL_COMMUNITY)
Admission: EM | Admit: 2018-03-22 | Discharge: 2018-03-22 | Disposition: A | Discharge status: Home / Self Care | Payer: Medicare HMO | Attending: Emergency Medicine | Admitting: Emergency Medicine

## 2018-03-22 ENCOUNTER — Encounter (HOSPITAL_COMMUNITY): Payer: Self-pay

## 2018-03-22 DIAGNOSIS — Z955 Presence of coronary angioplasty implant and graft: Secondary | ICD-10-CM | POA: Diagnosis not present

## 2018-03-22 DIAGNOSIS — R0789 Other chest pain: Secondary | ICD-10-CM | POA: Diagnosis not present

## 2018-03-22 DIAGNOSIS — I10 Essential (primary) hypertension: Secondary | ICD-10-CM | POA: Insufficient documentation

## 2018-03-22 DIAGNOSIS — R0902 Hypoxemia: Secondary | ICD-10-CM | POA: Diagnosis not present

## 2018-03-22 DIAGNOSIS — Z96642 Presence of left artificial hip joint: Secondary | ICD-10-CM | POA: Diagnosis not present

## 2018-03-22 DIAGNOSIS — Z79899 Other long term (current) drug therapy: Secondary | ICD-10-CM | POA: Diagnosis not present

## 2018-03-22 DIAGNOSIS — F17210 Nicotine dependence, cigarettes, uncomplicated: Secondary | ICD-10-CM | POA: Diagnosis not present

## 2018-03-22 DIAGNOSIS — E1159 Type 2 diabetes mellitus with other circulatory complications: Secondary | ICD-10-CM | POA: Diagnosis not present

## 2018-03-22 DIAGNOSIS — Z96652 Presence of left artificial knee joint: Secondary | ICD-10-CM | POA: Insufficient documentation

## 2018-03-22 DIAGNOSIS — I2510 Atherosclerotic heart disease of native coronary artery without angina pectoris: Secondary | ICD-10-CM | POA: Diagnosis not present

## 2018-03-22 DIAGNOSIS — R079 Chest pain, unspecified: Secondary | ICD-10-CM | POA: Diagnosis not present

## 2018-03-22 DIAGNOSIS — I774 Celiac artery compression syndrome: Secondary | ICD-10-CM | POA: Diagnosis not present

## 2018-03-22 DIAGNOSIS — Z7982 Long term (current) use of aspirin: Secondary | ICD-10-CM | POA: Insufficient documentation

## 2018-03-22 DIAGNOSIS — R0602 Shortness of breath: Secondary | ICD-10-CM | POA: Diagnosis not present

## 2018-03-22 LAB — BASIC METABOLIC PANEL
ANION GAP: 12 (ref 5–15)
BUN: 16 mg/dL (ref 8–23)
CHLORIDE: 104 mmol/L (ref 98–111)
CO2: 23 mmol/L (ref 22–32)
CREATININE: 1.18 mg/dL — AB (ref 0.44–1.00)
Calcium: 9.4 mg/dL (ref 8.9–10.3)
GFR, EST AFRICAN AMERICAN: 50 mL/min — AB (ref >60)
GFR, EST NON AFRICAN AMERICAN: 43 mL/min — AB (ref >60)
Glucose, Bld: 261 mg/dL — ABNORMAL HIGH (ref 70–99)
POTASSIUM: 4.2 mmol/L (ref 3.5–5.1)
SODIUM: 139 mmol/L (ref 135–145)

## 2018-03-22 LAB — I-STAT TROPONIN, ED: TROPONIN I, POC: 0 ng/mL (ref 0.00–0.08)

## 2018-03-22 LAB — CBC
HCT: 37.9 % (ref 36.0–46.0)
Hemoglobin: 11.7 g/dL — ABNORMAL LOW (ref 12.0–15.0)
MCH: 27.1 pg (ref 26.0–34.0)
MCHC: 30.9 g/dL (ref 30.0–36.0)
MCV: 87.9 fL (ref 80.0–100.0)
NRBC: 0 % (ref 0.0–0.2)
PLATELETS: 244 10*3/uL (ref 150–400)
RBC: 4.31 MIL/uL (ref 3.87–5.11)
RDW: 15.7 % — AB (ref 11.5–15.5)
WBC: 7.9 10*3/uL (ref 4.0–10.5)

## 2018-03-22 MED ORDER — IOPAMIDOL (ISOVUE-370) INJECTION 76%
100.0000 mL | Freq: Once | INTRAVENOUS | Status: AC | PRN
  Administered 2018-03-22: 100 mL via INTRAVENOUS

## 2018-03-22 MED ORDER — ONDANSETRON HCL 4 MG/2ML IJ SOLN
4.0000 mg | Freq: Once | INTRAMUSCULAR | Status: AC
  Administered 2018-03-22: 4 mg via INTRAVENOUS
  Filled 2018-03-22: qty 2

## 2018-03-22 MED ORDER — MORPHINE SULFATE (PF) 4 MG/ML IV SOLN
4.0000 mg | Freq: Once | INTRAVENOUS | Status: AC
  Administered 2018-03-22: 4 mg via INTRAVENOUS
  Filled 2018-03-22: qty 1

## 2018-03-22 MED ORDER — IOPAMIDOL (ISOVUE-370) INJECTION 76%
INTRAVENOUS | Status: AC
  Filled 2018-03-22: qty 100

## 2018-03-22 MED ORDER — SODIUM CHLORIDE 0.9 % IV BOLUS
500.0000 mL | Freq: Once | INTRAVENOUS | Status: AC
Start: 2018-03-22 — End: 2018-03-22
  Administered 2018-03-22: 500 mL via INTRAVENOUS

## 2018-03-22 NOTE — ED Triage Notes (Signed)
Pt presents for evaluation of chest pain with radiation to bilateral shoulders and back since Sunday. Saw PCP on Monday and told this was related to hypertension, pt had medication doubled on Monday for HTN. Pt states pain is 9/10, 6/10 after 2 nitro. 324 asa in route.

## 2018-03-22 NOTE — Discharge Instructions (Addendum)
Return if symptoms get worse.  Make sure you get your stress test on Monday

## 2018-03-22 NOTE — ED Notes (Signed)
Patient verbalizes understanding of discharge instructions. Opportunity for questioning and answers were provided. Armband removed by staff, pt discharged from ED in wheelchair.  

## 2018-03-22 NOTE — ED Provider Notes (Signed)
[facility] EMERGENCY DEPARTMENT Provider Note   CSN: [id number] Arrival date & time: 03/22/18  1217     History   Chief Complaint Chief Complaint  Patient presents with  . Chest Pain    HPI Kathaleya Mcduffee is a 77 y.o. female.  Patient complains of pain going into her upper back.  Patient was seen by her cardiologist on Monday he thinks that her discomfort is related to her blood pressure but has arranged for her to have a stress test on Monday  The history is provided by the patient. No language interpreter was used.  Chest Pain   This is a new problem. The current episode started more than 2 days ago. The problem occurs constantly. The problem has not changed since onset.The pain is associated with movement. The pain is present in the substernal region. The pain is at a severity of 7/10. The pain is moderate. The quality of the pain is described as dull. The pain does not radiate. The symptoms are aggravated by certain positions. Pertinent negatives include no abdominal pain, no back pain, no cough and no headaches.  Pertinent negatives for past medical history include no seizures.    Past Medical History:  Diagnosis Date  . AAA (abdominal aortic aneurysm) ([place])   . Aneurysm ([place])   . Arthritis    osteoarthritis  . Constipation   . Diabetes mellitus    Type 2, on Glimiperide  . GERD (gastroesophageal reflux disease)   . Glaucoma   . Hyperlipidemia   . Hypertension   . Kidney stones   . Myocardial infarction ([place])   . Peripheral vascular disease ([place])   . Pneumonia   . Stroke ([place]) 01/2015   with left side weakness   . TIA (transient ischemic attack)   . Urinary incontinence     Patient Active Problem List   Diagnosis Date Noted  . Chest pain 04/28/2017  . History of ST elevation myocardial infarction (STEMI) 04/28/2017  . Grief reaction 04/28/2017  . Osteoarthritis of left hip 05/18/2015  . Status post total replacement of left hip  05/18/2015  . Coronary artery disease involving native coronary artery of native heart with angina pectoris ([place]) 04/16/2015  . History of stroke 04/16/2015  . Type 2 diabetes mellitus with circulatory disorder ([place]) 04/16/2015  . Cerebrovascular 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 artery ([place]) 04/16/2015  . HLD (hyperlipidemia)   . Acute CVA (cerebrovascular accident) ([place]) 02/19/2015  . Transient ischemic attack (TIA) 02/18/2015  . Stroke ([place])   . Cerebral infarction due to thrombosis of right middle cerebral artery ([place])   . Acute myocardial infarction of inferolateral wall ([facility]) 04/12/2014  . Numbness 03/18/2014  . TIA (transient ischemic attack) 03/18/2014  . DM (diabetes mellitus) type II uncontrolled, periph vascular disorder ([place]) 03/18/2014  . Hyperlipidemia   . Hypertension   . PVD (peripheral vascular disease) ([place]) 03/05/2014  . Transient cerebral ischemia 08/01/2012  . Atherosclerosis of native arteries of extremity with intermittent claudication ([place]) 10/19/2011  . Unspecified disorders of arteries and arterioles 10/19/2011  . PAD (peripheral artery disease) ([place]) 04/13/2011    Past Surgical History:  Procedure Laterality Date  . ABDOMINAL AORTAGRAM N/A 03/13/2014   Procedure: ABDOMINAL Maxcine Ham;  Surgeon: Elam Dutch, MD;  Location: [facility] CATH LAB;  Service: Cardiovascular;  Laterality: N/A;  . ABDOMINAL AORTIC ANEURYSM REPAIR    . ABDOMINAL AORTIC ANEURYSM REPAIR  ?2012  . ABDOMINAL HYSTERECTOMY     partial  .  BACK SURGERY    . CARDIAC CATHETERIZATION    . CORONARY ANGIOPLASTY  03/2015  . EYE SURGERY Right    laser surgery for blood behind eye, loss of sight  . FEMORAL-POPLITEAL BYPASS GRAFT Left 07/06/2014   Procedure: BYPASS GRAFT FEMORAL-POPLITEAL ARTERY;  Surgeon: Elam Dutch, MD;  Location: [facility];  Service: Vascular;  Laterality: Left;  . HERNIA REPAIR    . JOINT REPLACEMENT    . LEFT HEART CATHETERIZATION WITH CORONARY  ANGIOGRAM N/A 04/12/2014   Procedure: LEFT HEART CATHETERIZATION WITH CORONARY ANGIOGRAM;  Surgeon: Clent Demark, MD;  [facility];  Service: Cardiovascular;  Laterality: N/A;  . PERCUTANEOUS CORONARY STENT INTERVENTION (PCI-S)  04/12/2014   Procedure: PERCUTANEOUS CORONARY STENT INTERVENTION (PCI-S);  Surgeon: Clent Demark, MD;  Location: [facility] CATH LAB;  Service: Cardiovascular;;  prox and mid RCA  . TOTAL HIP ARTHROPLASTY Left 05/18/2015  . TOTAL HIP ARTHROPLASTY Left 05/18/2015   Procedure: LEFT TOTAL HIP ARTHROPLASTY ANTERIOR APPROACH;  Surgeon: Mcarthur Rossetti, MD;  Location: [facility];  Service: Orthopedics;  Laterality: Left;  . TOTAL KNEE ARTHROPLASTY Left ~ 2003  . TUBAL LIGATION    . uterine tumor    . VENTRAL HERNIA REPAIR       OB History   None      Home Medications    Prior to Admission medications   Medication Sig Start Date End Date Taking? Authorizing Provider  acetaminophen (TYLENOL) 325 MG tablet Take 2 tablets (650 mg total) by mouth every 6 (six) hours as needed for mild pain or moderate pain (or Fever >/= 101). 05/21/15   Mcarthur Rossetti, MD  albuterol (PROVENTIL HFA;VENTOLIN HFA) 108 (90 Base) MCG/ACT inhaler Inhale 2 puffs into the lungs every 4 (four) hours as needed for wheezing or shortness of breath (or coughing). Patient not taking: Reported on 7/62/8315 1/76/16   Delora Fuel, MD  amLODipine (NORVASC) 5 MG tablet Take 5 mg by mouth 2 (two) times daily.     [provider]  aspirin EC 81 MG tablet Take 81 mg by mouth daily.    [provider]  baclofen (LIORESAL) 10 MG tablet Take 0.5-1 tablets (5-10 mg total) by mouth 3 (three) times daily as needed for muscle spasms. 02/08/18   Hilts, Legrand Como, MD  glimepiride (AMARYL) 2 MG tablet Take 2-3 mg by mouth 2 (two) times daily. Take 3 mg by mouth in the morning and take 2 mg by mouth in the afternoon (around 6 pm)    [provider]  isosorbide mononitrate  (IMDUR) 60 MG 24 hr tablet Take 60 mg by mouth daily. 02/24/15   [provider]  methocarbamol (ROBAXIN) 500 MG tablet Take 1 tablet (500 mg total) by mouth every 6 (six) hours as needed for muscle spasms. Patient not taking: Reported on 02/08/2018 05/21/15   Mcarthur Rossetti, MD  metoprolol (TOPROL-XL) 50 MG 24 hr tablet Take 50 mg by mouth daily.     [provider]  nitroGLYCERIN (NITROSTAT) 0.4 MG SL tablet Place 0.4 mg under the tongue every 5 (five) minutes as needed for chest pain.    [provider]  ticagrelor (BRILINTA) 90 MG TABS tablet Take 1 tablet (90 mg total) by mouth 2 (two) times daily. 04/15/14   [name], MD  vitamin B-12 (CYANOCOBALAMIN) 50 MCG tablet Take 50 mcg by mouth daily.    [provider]    Family History Family History  Problem Relation Age of  Onset  . Diabetes Mother   . Stroke Mother   . Hypertension Father   . Stroke Father   . Hyperlipidemia Sister   . Hypertension Sister   . Aneurysm Sister   . Hyperlipidemia Brother   . Hypertension Brother     Social History Social History   Tobacco Use  . Smoking status: Current Every Day Smoker    Packs/day: 0.50    Years: 40.00    Pack years: 20.00    Types: Cigarettes  . Smokeless tobacco: Never Used  Substance Use Topics  . Alcohol use: No    Alcohol/week: 0.0 standard drinks  . Drug use: No     Allergies   Keflex [cephalexin]; Codeine; Lipitor [atorvastatin]; Azor [amlodipine-olmesartan]; Lisinopril; and Penicillins   Review of Systems Review of Systems  Constitutional: Negative for appetite change and fatigue.  HENT: Negative for congestion, ear discharge and sinus pressure.   Eyes: Negative for discharge.  Respiratory: Negative for cough.   Cardiovascular: Positive for chest pain.  Gastrointestinal: Negative for abdominal pain and diarrhea.  Genitourinary: Negative for frequency and hematuria.  Musculoskeletal: Negative for back pain.    Skin: Negative for rash.  Neurological: Negative for seizures and headaches.  Psychiatric/Behavioral: Negative for hallucinations.     Physical Exam Updated Vital Signs BP (!) 166/86   Pulse (!) 50   Temp 98 F (36.7 C) (Oral)   Resp 14   SpO2 97%   Physical Exam  Constitutional: She is oriented to person, place, and time. She appears well-developed.  HENT:  Head: Normocephalic.  Eyes: Conjunctivae and EOM are normal. No scleral icterus.  Neck: Neck supple. No thyromegaly present.  Cardiovascular: Normal rate and regular rhythm. Exam reveals no gallop and no friction rub.  No murmur heard. Pulmonary/Chest: No stridor. She has no wheezes. She has no rales. She exhibits no tenderness.  Abdominal: She exhibits no distension. There is no tenderness. There is no rebound.  Musculoskeletal: Normal range of motion. She exhibits no edema.  Lymphadenopathy:    She has no cervical adenopathy.  Neurological: She is oriented to person, place, and time. She exhibits normal muscle tone. Coordination normal.  Skin: No rash noted. No erythema.  Psychiatric: She has a normal mood and affect. Her behavior is normal.     ED Treatments / Results  Labs (all labs ordered are listed, but only abnormal results are displayed) Labs Reviewed  BASIC METABOLIC PANEL - Abnormal; Notable for the following components:      Result Value   Glucose, Bld 261 (*)    Creatinine, Ser 1.18 (*)    GFR calc non Af Amer 43 (*)    GFR calc Af Amer 50 (*)    All other components within normal limits  CBC - Abnormal; Notable for the following components:   Hemoglobin 11.7 (*)    RDW 15.7 (*)    All other components within normal limits  I-STAT TROPONIN, ED    EKG EKG Interpretation  Date/Time:  Friday March 22 2018 12:20:04 EDT Ventricular Rate:  55 PR Interval:  156 QRS Duration: 80 QT Interval:  472 QTC Calculation: 451 R Axis:   65 Text Interpretation:  Sinus bradycardia Nonspecific ST  abnormality Abnormal ECG Confirmed by Milton Ferguson [phone]) on 03/22/2018 7:29:51 PM   Radiology Dg Chest 2 View  Result Date: 03/22/2018 CLINICAL DATA:  Chest and back pain for the past 5 days. History of hypertension, emphysema, diabetes, abdominal aortic aneurysm, current smoker. EXAM: CHEST -  2 VIEW COMPARISON:  Portable chest x-ray of April 28, 2017 and chest CT scan of April 28, 2017. FINDINGS: The lungs are mildly hyperinflated. The interstitial markings are coarse. There is subtle increased lung markings in the retrocardiac region on the right. The heart and pulmonary vascularity are normal. There is calcification in the wall of the thoracic aorta. There is multilevel degenerative disc disease of the thoracic spine. IMPRESSION: Chronic bronchitic changes. Atelectasis or developing pneumonia in the right lower lobe. No CHF. Followup PA and lateral chest X-ray is recommended in 3-4 weeks following trial of antibiotic therapy to ensure resolution and exclude underlying malignancy. Thoracic aortic atherosclerosis. Electronically Signed   By: David  Martinique M.D.   On: 03/22/2018 13:07   Ct Angio Chest/abd/pel For Dissection W And/or Wo Contrast  Result Date: 03/22/2018 CLINICAL DATA:  Chest pain radiating to the shoulders EXAM: CT ANGIOGRAPHY CHEST, ABDOMEN AND PELVIS TECHNIQUE: Multidetector CT imaging through the chest, abdomen and pelvis was performed using the standard protocol during bolus administration of intravenous contrast. Multiplanar reconstructed images and MIPs were obtained and reviewed to evaluate the vascular anatomy. CONTRAST:  126mL ISOVUE-370 IOPAMIDOL (ISOVUE-370) INJECTION 76% COMPARISON:  Chest x-ray 03/22/2018, CT 04/28/2017, CT 03/11/2014, 04/25/2012 FINDINGS: CTA CHEST FINDINGS Cardiovascular: Non contrasted images of the chest demonstrate extensive aortic atherosclerosis. No evidence for intramural hematoma. Coronary artery calcification. Heart size is within normal  limits. No significant pericardial effusion. 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 Maximum diameter 15 mm, no change as compared with prior CTA. Common origin of the bilateral common carotid arteries. No acute dissection is seen. Similar appearance of focal outpouching at the distal underside of the arch measuring 8 x 6 mm. Extensive mural thickening throughout the descending thoracic aorta without dissection. Mediastinum/Nodes: Midline trachea. No thyroid mass. AP window lymph nodes measuring up to 1 cm. Esophagus within normal limits. Lungs/Pleura: Moderate emphysema. No acute consolidation or effusion. Stable right upper lobe posterior subpleural nodule. Musculoskeletal: No acute or suspicious abnormality. Review of the MIP images confirms the above findings. CTA ABDOMEN AND PELVIS FINDINGS VASCULAR Aorta: No aneurism or dissection is visualized. Moderate mural thickening with areas of ulceration at the distal descending thoracic aorta, and the suprarenal proximal abdominal aorta. No acute aortic occlusion. Celiac: Mild stenosis at the origin of the celiac artery. SMA: No significant stenosis at the origin. Replaced right hepatic artery. Calcification and mural thrombus within the superior mesenteric artery about 2 cm distal to the origin with moderate stenosis over an 18 mm segment but with distal flow present. Renals: Suspected high-grade stenosis at the origin of left renal artery. Moderate stenosis at the origin of the right renal artery with moderate stenosis of the proximal right renal artery over a approximate 1 cm segment. IMA: Chronic occlusion of the IMA. Inflow: Moderate severe stenosis at the origin of the right common iliac artery. Mild stenosis at the origin of the left common iliac artery. Moderate diffuse diffuse disease of the right external iliac artery and  internal iliac artery. Moderate diffuse disease of the left internal and external iliac arteries. Prior left femoral bypass. Review of the MIP images confirms the above findings. NON-VASCULAR Hepatobiliary: No focal hepatic abnormality. No calcified gallstone. No biliary dilatation. Pancreas: Stable 11 mm focal enhancement at the uncinate process of the pancreas. No inflammatory change. Stable ductal dilatation. Spleen: Normal in size without focal abnormality. Adrenals/Urinary Tract: Nodular  adrenal gland without dominant mass. Kidneys show no hydronephrosis. Small cysts in the right kidney. The bladder is normal Stomach/Bowel: Stomach is within normal limits. Appendix appears normal. No evidence of bowel wall thickening, distention, or inflammatory changes. Lymphatic: No significantly enlarged lymph nodes Reproductive: Status post hysterectomy. No adnexal masses. Other: Negative for free air or free fluid. Supraumbilical ventral hernia containing fat and segment of small bowel without obstruction. Multiple additional fat containing ventral and periumbilical hernias. Musculoskeletal: Status post left hip replacement. Grade 1 anterolisthesis L4 on L5. Degenerative change. No acute or suspicious abnormality Review of the MIP images confirms the above findings. IMPRESSION: Vascular: 1. Negative for acute aortic dissection or acute intramural hematoma. 2. 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 Common origin of common carotid arteries. 3. Extensive aortic atherosclerosis and mural disease. Small areas of aortic ulceration within the abdominal aorta with no acute dissection seen. 4. Suspected hemodynamically significant stenosis at the origin of the left renal artery. 5. Mild stenosis at the origin of the celiac. Atherosclerosis and mural thickening within the SMA but with distal patency and no acute occlusion 6. Chronic occlusion of the IMA  7. 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 Moderate emphysema.  No acute pulmonary infiltrate. 2. No CT evidence for acute intra-abdominal or pelvic abnormality. 3. Multiple ventral hernia. One of the hernias contains fat and small segment of small bowel but no obstruction is noted. Electronically Signed   By: Donavan Foil M.D.   On: 03/22/2018 21:24    Procedures Procedures (including critical care time)  Medications Ordered in ED Medications  iopamidol (ISOVUE-370) 76 % injection (has no administration in time range)  ondansetron (ZOFRAN) injection 4 mg (has no administration in time range)  sodium chloride 0.9 % bolus 500 mL (0 mLs Intravenous Stopped 03/22/18 2133)  morphine 4 MG/ML injection 4 mg (4 mg Intravenous Given 03/22/18 2005)  iopamidol (ISOVUE-370) 76 % injection 100 mL (100 mLs Intravenous Contrast Given 03/22/18 2021)     Initial Impression / Assessment and Plan / ED Course  I have reviewed the triage vital signs and the nursing notes.  Pertinent labs & imaging results that were available during my care of the patient were reviewed by me and considered in my medical decision making (see chart for details).     CT angios does not show any dissection.  There is some question about one renal artery with stenosis.  Patient's labs so far have been normal.  I told the patient we should admit her for the chest pain and arrange for that stress test at earlier time.  She refused admission.  Patient will leave AMA and follow-up with her stress test Monday  Final Clinical Impressions(s) / ED Diagnoses   Final diagnoses:  Atypical chest pain    ED Discharge Orders    None       Milton Ferguson, MD 03/22/18 2154

## 2018-03-25 ENCOUNTER — Ambulatory Visit (HOSPITAL_COMMUNITY)
Admission: RE | Admit: 2018-03-25 | Discharge: 2018-03-25 | Disposition: A | Discharge status: Home / Self Care | Payer: Medicare HMO | Source: Ambulatory Visit | Attending: Cardiology | Admitting: Cardiology

## 2018-03-25 DIAGNOSIS — I252 Old myocardial infarction: Secondary | ICD-10-CM | POA: Diagnosis not present

## 2018-03-25 DIAGNOSIS — F419 Anxiety disorder, unspecified: Secondary | ICD-10-CM | POA: Diagnosis not present

## 2018-03-25 DIAGNOSIS — I10 Essential (primary) hypertension: Secondary | ICD-10-CM | POA: Diagnosis not present

## 2018-03-25 DIAGNOSIS — R079 Chest pain, unspecified: Secondary | ICD-10-CM

## 2018-03-25 DIAGNOSIS — F17290 Nicotine dependence, other tobacco product, uncomplicated: Secondary | ICD-10-CM | POA: Diagnosis not present

## 2018-03-25 DIAGNOSIS — E785 Hyperlipidemia, unspecified: Secondary | ICD-10-CM | POA: Diagnosis not present

## 2018-03-25 DIAGNOSIS — I739 Peripheral vascular disease, unspecified: Secondary | ICD-10-CM | POA: Diagnosis not present

## 2018-03-25 DIAGNOSIS — Z8673 Personal history of transient ischemic attack (TIA), and cerebral infarction without residual deficits: Secondary | ICD-10-CM | POA: Diagnosis not present

## 2018-03-25 DIAGNOSIS — E119 Type 2 diabetes mellitus without complications: Secondary | ICD-10-CM | POA: Diagnosis not present

## 2018-03-25 DIAGNOSIS — I25110 Atherosclerotic heart disease of native coronary artery with unstable angina pectoris: Secondary | ICD-10-CM | POA: Diagnosis not present

## 2018-03-25 MED ORDER — REGADENOSON 0.4 MG/5ML IV SOLN
0.4000 mg | Freq: Once | INTRAVENOUS | Status: AC
  Administered 2018-03-25: 0.4 mg via INTRAVENOUS

## 2018-03-25 MED ORDER — TECHNETIUM TC 99M TETROFOSMIN IV KIT
30.0000 | PACK | Freq: Once | INTRAVENOUS | Status: AC | PRN
  Administered 2018-03-25: 30 via INTRAVENOUS

## 2018-03-25 MED ORDER — REGADENOSON 0.4 MG/5ML IV SOLN
INTRAVENOUS | Status: AC
  Filled 2018-03-25: qty 5

## 2018-03-25 MED ORDER — TECHNETIUM TC 99M TETROFOSMIN IV KIT
10.0000 | PACK | Freq: Once | INTRAVENOUS | Status: AC | PRN
  Administered 2018-03-25: 10 via INTRAVENOUS

## 2018-05-27 ENCOUNTER — Ambulatory Visit (HOSPITAL_COMMUNITY)
Admission: EM | Admit: 2018-05-27 | Discharge: 2018-05-27 | Discharge status: Absent without leave | Payer: Medicare HMO | Source: Ambulatory Visit

## 2018-05-28 ENCOUNTER — Ambulatory Visit (HOSPITAL_COMMUNITY)
Admission: EM | Admit: 2018-05-28 | Discharge: 2018-05-28 | Disposition: A | Discharge status: Home / Self Care | Payer: Medicare HMO | Attending: Family Medicine | Admitting: Family Medicine

## 2018-05-28 ENCOUNTER — Encounter (HOSPITAL_COMMUNITY): Payer: Self-pay

## 2018-05-28 ENCOUNTER — Ambulatory Visit (INDEPENDENT_AMBULATORY_CARE_PROVIDER_SITE_OTHER): Payer: Medicare HMO

## 2018-05-28 ENCOUNTER — Ambulatory Visit (HOSPITAL_COMMUNITY): Payer: Medicare HMO

## 2018-05-28 DIAGNOSIS — J209 Acute bronchitis, unspecified: Secondary | ICD-10-CM | POA: Diagnosis not present

## 2018-05-28 DIAGNOSIS — R05 Cough: Secondary | ICD-10-CM | POA: Diagnosis not present

## 2018-05-28 DIAGNOSIS — J208 Acute bronchitis due to other specified organisms: Secondary | ICD-10-CM | POA: Diagnosis not present

## 2018-05-28 MED ORDER — PREDNISONE 10 MG PO TABS: 40.0000 mg | ORAL_TABLET | Freq: Every day | ORAL | 0 refills | Status: DC

## 2018-05-28 MED ORDER — AZITHROMYCIN 250 MG PO TABS: ORAL_TABLET | ORAL | 0 refills | Status: DC

## 2018-05-28 MED ORDER — BENZONATATE 100 MG PO CAPS: 100.0000 mg | ORAL_CAPSULE | Freq: Three times a day (TID) | ORAL | 0 refills | Status: DC

## 2018-05-28 NOTE — ED Provider Notes (Signed)
[facility]    CSN: [id number] Arrival date & time: 05/28/18  6440     History   Chief Complaint Chief Complaint  Patient presents with  . Cough  . Nasal Congestion    HPI [name] is a 77 y.o. female every day smoker who presents to the ED with cough and nasal congestion that started over a week ago and has gotten worse. Patient reports having pneumonia last year and this feels similar.   The history is provided by the patient. No language interpreter was used.  Cough  Cough characteristics:  Productive Sputum characteristics:  Yellow Severity:  Moderate Onset quality:  Gradual Duration:  1 week Progression:  Worsening Smoker: yes   Context: sick contacts   Relieved by:  Cough suppressants Worsened by:  Smoking Associated symptoms: fever, myalgias, rhinorrhea, shortness of breath, sinus congestion, sore throat and wheezing   Associated symptoms: no ear pain, no eye discharge, no headaches and no rash     Past Medical History:  Diagnosis Date  . AAA (abdominal aortic aneurysm) ([place])   . Aneurysm ([place])   . Arthritis    osteoarthritis  . Constipation   . Diabetes mellitus    Type 2, on Glimiperide  . GERD (gastroesophageal reflux disease)   . Glaucoma   . Hyperlipidemia   . Hypertension   . Kidney stones   . Myocardial infarction ([place])   . Peripheral vascular disease ([place])   . Pneumonia   . Stroke ([place]) 01/2015   with left side weakness   . TIA (transient ischemic attack)   . Urinary incontinence     Patient Active Problem List   Diagnosis Date Noted  . Chest pain 04/28/2017  . History of ST elevation myocardial infarction (STEMI) 04/28/2017  . Grief reaction 04/28/2017  . Osteoarthritis of left hip 05/18/2015  . Status post total replacement of left hip 05/18/2015  . Coronary artery disease involving native coronary artery of native heart with angina pectoris ([place]) 04/16/2015  . History of stroke 04/16/2015  . Type 2 diabetes  mellitus with circulatory disorder ([place]) 04/16/2015  . Cerebrovascular 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 artery ([place]) 04/16/2015  . HLD (hyperlipidemia)   . Acute CVA (cerebrovascular accident) ([place]) 02/19/2015  . Transient ischemic attack (TIA) 02/18/2015  . Stroke ([place])   . Cerebral infarction due to thrombosis of right middle cerebral artery ([place])   . Acute myocardial infarction of inferolateral wall (HCC) 04/12/2014  . Numbness 03/18/2014  . TIA (transient ischemic attack) 03/18/2014  . DM (diabetes mellitus) type II uncontrolled, periph vascular disorder ([place]) 03/18/2014  . Hyperlipidemia   . Hypertension   . PVD (peripheral vascular disease) ([place]) 03/05/2014  . Transient cerebral ischemia 08/01/2012  . Atherosclerosis of native arteries of extremity with intermittent claudication ([place]) 10/19/2011  . Unspecified disorders of arteries and arterioles 10/19/2011  . PAD (peripheral artery disease) ([place]) 04/13/2011    Past Surgical History:  Procedure Laterality Date  . ABDOMINAL AORTAGRAM N/A 03/13/2014   Procedure: ABDOMINAL Maxcine Ham;  Surgeon: Elam Dutch, MD;  Location: [facility] CATH LAB;  Service: Cardiovascular;  Laterality: N/A;  . ABDOMINAL AORTIC ANEURYSM REPAIR    . ABDOMINAL AORTIC ANEURYSM REPAIR  ?2012  . ABDOMINAL HYSTERECTOMY     partial  . BACK SURGERY    . CARDIAC CATHETERIZATION    . CORONARY ANGIOPLASTY  03/2015  . EYE SURGERY Right    laser surgery for blood behind eye, loss of sight  .  FEMORAL-POPLITEAL BYPASS GRAFT Left 07/06/2014   Procedure: BYPASS GRAFT FEMORAL-POPLITEAL ARTERY;  Surgeon: Elam Dutch, MD;  Location: [place];  Service: Vascular;  Laterality: Left;  . HERNIA REPAIR    . JOINT REPLACEMENT    . LEFT HEART CATHETERIZATION WITH CORONARY ANGIOGRAM N/A 04/12/2014   Procedure: LEFT HEART CATHETERIZATION WITH CORONARY ANGIOGRAM;  Surgeon: Clent Demark, MD;  Location: [place] CATH LAB;  Service: Cardiovascular;  Laterality:  N/A;  . PERCUTANEOUS CORONARY STENT INTERVENTION (PCI-S)  04/12/2014   Procedure: PERCUTANEOUS CORONARY STENT INTERVENTION (PCI-S);  Surgeon: Clent Demark, MD;  Location: [facility] CATH LAB;  Service: Cardiovascular;;  prox and mid RCA  . TOTAL HIP ARTHROPLASTY Left 05/18/2015  . TOTAL HIP ARTHROPLASTY Left 05/18/2015   Procedure: LEFT TOTAL HIP ARTHROPLASTY ANTERIOR APPROACH;  Surgeon: Mcarthur Rossetti, MD;  Location: [place];  Service: Orthopedics;  Laterality: Left;  . TOTAL KNEE ARTHROPLASTY Left ~ 2003  . TUBAL LIGATION    . uterine tumor    . VENTRAL HERNIA REPAIR      OB History   No obstetric history on file.      Home Medications    Prior to Admission medications   Medication Sig Start Date End Date Taking? Authorizing Provider  acetaminophen (TYLENOL) 325 MG tablet Take 2 tablets (650 mg total) by mouth every 6 (six) hours as needed for mild pain or moderate pain (or Fever >/= 101). 05/21/15   Mcarthur Rossetti, MD  albuterol (PROVENTIL HFA;VENTOLIN HFA) 108 (90 Base) MCG/ACT inhaler Inhale 2 puffs into the lungs every 4 (four) hours as needed for wheezing or shortness of breath (or coughing). Patient not taking: Reported on 3/79/0240 9/73/53   Delora Fuel, MD  amLODipine (NORVASC) 5 MG tablet Take 5 mg by mouth 2 (two) times daily.     [provider]  aspirin EC 81 MG tablet Take 81 mg by mouth daily.    [provider]  azithromycin (ZITHROMAX Z-PAK) 250 MG tablet Take the first 2 tablets now and then one tablet daily for infection. 05/28/18   Ashley Murrain, NP  baclofen (LIORESAL) 10 MG tablet Take 0.5-1 tablets (5-10 mg total) by mouth 3 (three) times daily as needed for muscle spasms. 02/08/18   Hilts, Legrand Como, MD  benzonatate (TESSALON) 100 MG capsule Take 1 capsule (100 mg total) by mouth every 8 (eight) hours. 05/28/18   Ashley Murrain, NP  glimepiride (AMARYL) 2 MG tablet Take 2-3 mg by mouth 2 (two) times daily. Take 3 mg by mouth in the  morning and take 2 mg by mouth in the afternoon (around 6 pm)    [provider]  isosorbide mononitrate (IMDUR) 60 MG 24 hr tablet Take 60 mg by mouth daily. 02/24/15   [provider]  methocarbamol (ROBAXIN) 500 MG tablet Take 1 tablet (500 mg total) by mouth every 6 (six) hours as needed for muscle spasms. Patient not taking: Reported on 02/08/2018 05/21/15   Mcarthur Rossetti, MD  metoprolol (TOPROL-XL) 50 MG 24 hr tablet Take 50 mg by mouth daily.     [provider]  nitroGLYCERIN (NITROSTAT) 0.4 MG SL tablet Place 0.4 mg under the tongue every 5 (five) minutes as needed for chest pain.    [provider]  predniSONE (DELTASONE) 10 MG tablet Take 4 tablets (40 mg total) by mouth daily with breakfast. 05/28/18   Ashley Murrain, NP  ticagrelor (BRILINTA) 90 MG TABS tablet Take 1 tablet (90 mg  total) by mouth 2 (two) times daily. 04/15/14   [name], MD  vitamin B-12 (CYANOCOBALAMIN) 50 MCG tablet Take 50 mcg by mouth daily.    [provider]    Family History Family History  Problem Relation Age of Onset  . Diabetes Mother   . Stroke Mother   . Hypertension Father   . Stroke Father   . Hyperlipidemia Sister   . Hypertension Sister   . Aneurysm Sister   . Hyperlipidemia Brother   . Hypertension Brother     Social History Social History   Tobacco Use  . Smoking status: Current Every Day Smoker    Packs/day: 0.50    Years: 40.00    Pack years: 20.00    Types: Cigarettes  . Smokeless tobacco: Never Used  Substance Use Topics  . Alcohol use: No    Alcohol/week: 0.0 standard drinks  . Drug use: No     Allergies   Keflex [cephalexin]; Codeine; Lipitor [atorvastatin]; Azor [amlodipine-olmesartan]; Lisinopril; and Penicillins   Review of Systems Review of Systems  Constitutional: Positive for fever.  HENT: Positive for congestion, postnasal drip, rhinorrhea and sore throat. Negative for ear pain.   Eyes: Positive for  redness and itching. Negative for discharge.  Respiratory: Positive for cough, shortness of breath and wheezing.   Gastrointestinal: Negative for abdominal pain, nausea and vomiting.  Genitourinary: Negative for dysuria, frequency and urgency.  Musculoskeletal: Positive for myalgias.  Skin: Negative for rash.  Neurological: Negative for headaches.  Hematological: Negative for adenopathy.  Psychiatric/Behavioral: Negative for confusion.     Physical Exam Triage Vital Signs ED Triage Vitals  Enc Vitals Group     BP 05/28/18 0958 (!) 142/57     Pulse Rate 05/28/18 0958 72     Resp 05/28/18 0958 18     Temp 05/28/18 0958 98.1 F (36.7 C)     Temp Source 05/28/18 0958 Oral     SpO2 05/28/18 0958 100 %     Weight --      Height --      Head Circumference --      Peak Flow --      Pain Score 05/28/18 0959 0     Pain Loc --      Pain Edu? --      Excl. in [place]? --    No data found.  Updated Vital Signs BP (!) 142/57 (BP Location: Left Arm)   Pulse 72   Temp 98.1 F (36.7 C) (Oral)   Resp 18   SpO2 100%   Visual Acuity Right Eye Distance:   Left Eye Distance:   Bilateral Distance:    Right Eye Near:   Left Eye Near:    Bilateral Near:     Physical Exam Vitals signs and nursing note reviewed.  Constitutional:      General: She is not in acute distress.    Appearance: Normal appearance. She is well-developed.  HENT:     Head: Normocephalic.     Right Ear: Tympanic membrane normal.     Left Ear: Tympanic membrane normal.     Nose: Congestion present.     Mouth/Throat:     Mouth: Mucous membranes are moist.     Pharynx: Oropharynx is clear.  Eyes:     Extraocular Movements: Extraocular movements intact.     Conjunctiva/sclera: Conjunctivae normal.  Neck:     Musculoskeletal: Normal range of motion and neck supple.  Cardiovascular:     Rate and  Rhythm: Normal rate and regular rhythm.  Pulmonary:     Effort: Pulmonary effort is normal. No respiratory distress.       Breath sounds: No wheezing or rales.  Abdominal:     Palpations: Abdomen is soft.     Tenderness: There is no abdominal tenderness.  Musculoskeletal: Normal range of motion.  Skin:    General: Skin is warm and dry.  Neurological:     General: No focal deficit present.     Mental Status: She is alert and oriented to person, place, and time.     Cranial Nerves: No cranial nerve deficit.  Psychiatric:        Mood and Affect: Mood normal.      UC Treatments / Results  Labs (all labs ordered are listed, but only abnormal results are displayed) Labs Reviewed - No data to display  Radiology Dg Chest 2 View  Result Date: 05/28/2018 CLINICAL DATA:  Cough, fever EXAM: CHEST - 2 VIEW COMPARISON:  03/22/2018 FINDINGS: Heart is normal size. Diffuse interstitial prominence throughout the lungs, worsened since prior study. Mild peribronchial thickening. No confluent opacities or effusions. No acute bony abnormality. IMPRESSION: Peribronchial thickening and increasing interstitial prominence within the lungs. This could reflect bronchitis or atypical infection. Electronically Signed   By: Rolm Baptise M.D.   On: 05/28/2018 11:15    Procedures Procedures (including critical care time)  Medications Ordered in UC Medications - No data to display  Initial Impression / Assessment and Plan / UC Course  I have reviewed the triage vital signs and the nursing notes. Pt CXR negative for focal infiltrate. Patients symptoms are consistent with bronchitis. Discussed that we will treat her with antibiotics since she is an every day smoker and at increased risk for infection. . Pt will be discharged with Z-Pak, Tessalon and short steroid burst.  Verbalizes understanding and is agreeable with plan. Pt is hemodynamically stable & in NAD prior to dc. Final Clinical Impressions(s) / UC Diagnoses   Final diagnoses:  Acute bronchitis, unspecified organism   Discharge Instructions   None    ED  Prescriptions    Medication Sig Dispense Auth. Provider   azithromycin (ZITHROMAX Z-PAK) 250 MG tablet Take the first 2 tablets now and then one tablet daily for infection. 6 tablet Debroah Baller M, NP   benzonatate (TESSALON) 100 MG capsule Take 1 capsule (100 mg total) by mouth every 8 (eight) hours. 21 capsule Rexford, Spaulding M, NP   predniSONE (DELTASONE) 10 MG tablet Take 4 tablets (40 mg total) by mouth daily with breakfast. 16 tablet Ashley Murrain, NP     Controlled Substance Prescriptions [place] Controlled Substance Registry consulted? Not Applicable   Ashley Murrain, NP 05/28/18 1230

## 2018-05-28 NOTE — ED Triage Notes (Signed)
Pt present coughing and nasal congestion, symptoms started over a week ago. Pt has tried OTC medication but no relief.

## 2018-06-18 DIAGNOSIS — I10 Essential (primary) hypertension: Secondary | ICD-10-CM | POA: Diagnosis not present

## 2018-06-18 DIAGNOSIS — J019 Acute sinusitis, unspecified: Secondary | ICD-10-CM | POA: Diagnosis not present

## 2018-06-18 DIAGNOSIS — E1121 Type 2 diabetes mellitus with diabetic nephropathy: Secondary | ICD-10-CM | POA: Diagnosis not present

## 2018-06-18 DIAGNOSIS — D649 Anemia, unspecified: Secondary | ICD-10-CM | POA: Diagnosis not present

## 2018-06-18 DIAGNOSIS — F329 Major depressive disorder, single episode, unspecified: Secondary | ICD-10-CM | POA: Diagnosis not present

## 2018-06-18 DIAGNOSIS — Z7984 Long term (current) use of oral hypoglycemic drugs: Secondary | ICD-10-CM | POA: Diagnosis not present

## 2018-06-18 DIAGNOSIS — J40 Bronchitis, not specified as acute or chronic: Secondary | ICD-10-CM | POA: Diagnosis not present

## 2018-06-18 DIAGNOSIS — R5383 Other fatigue: Secondary | ICD-10-CM | POA: Diagnosis not present

## 2018-07-02 DIAGNOSIS — J439 Emphysema, unspecified: Secondary | ICD-10-CM | POA: Diagnosis not present

## 2018-07-02 DIAGNOSIS — F322 Major depressive disorder, single episode, severe without psychotic features: Secondary | ICD-10-CM | POA: Diagnosis not present

## 2018-07-16 ENCOUNTER — Ambulatory Visit (INDEPENDENT_AMBULATORY_CARE_PROVIDER_SITE_OTHER): Payer: Medicare HMO | Admitting: Orthopedic Surgery

## 2018-07-16 ENCOUNTER — Encounter (INDEPENDENT_AMBULATORY_CARE_PROVIDER_SITE_OTHER): Payer: Self-pay | Admitting: Orthopedic Surgery

## 2018-07-16 VITALS — Ht 67.0 in | Wt 168.0 lb

## 2018-07-16 DIAGNOSIS — E08621 Diabetes mellitus due to underlying condition with foot ulcer: Secondary | ICD-10-CM

## 2018-07-16 DIAGNOSIS — B351 Tinea unguium: Secondary | ICD-10-CM

## 2018-07-16 DIAGNOSIS — E119 Type 2 diabetes mellitus without complications: Secondary | ICD-10-CM | POA: Diagnosis not present

## 2018-07-16 DIAGNOSIS — I10 Essential (primary) hypertension: Secondary | ICD-10-CM | POA: Diagnosis not present

## 2018-07-16 DIAGNOSIS — Z8673 Personal history of transient ischemic attack (TIA), and cerebral infarction without residual deficits: Secondary | ICD-10-CM | POA: Diagnosis not present

## 2018-07-16 DIAGNOSIS — L97411 Non-pressure chronic ulcer of right heel and midfoot limited to breakdown of skin: Secondary | ICD-10-CM | POA: Diagnosis not present

## 2018-07-16 DIAGNOSIS — E785 Hyperlipidemia, unspecified: Secondary | ICD-10-CM | POA: Diagnosis not present

## 2018-07-16 DIAGNOSIS — I252 Old myocardial infarction: Secondary | ICD-10-CM | POA: Diagnosis not present

## 2018-07-16 DIAGNOSIS — I739 Peripheral vascular disease, unspecified: Secondary | ICD-10-CM | POA: Diagnosis not present

## 2018-07-16 DIAGNOSIS — F17290 Nicotine dependence, other tobacco product, uncomplicated: Secondary | ICD-10-CM | POA: Diagnosis not present

## 2018-07-16 DIAGNOSIS — I25118 Atherosclerotic heart disease of native coronary artery with other forms of angina pectoris: Secondary | ICD-10-CM | POA: Diagnosis not present

## 2018-07-16 NOTE — Progress Notes (Signed)
Office Visit Note   Patient: [name]           Date of Birth: June 10, 1940           MRN: [id number] Visit Date: 07/16/2018              Requested by: [name], MD [address] [place], [place] [address] PCP: [name], MD  Chief Complaint  Patient presents with  . Left Foot - Follow-up  . Right Foot - Follow-up      HPI: Patient is a 78 year old woman with diabetic insensate neuropathy she complains of painful nails bilaterally with onychomycosis.  She states she is unable to safely trim them on her own.  Patient also complains of painful ulcers beneath the metatarsal heads of both feet.  Assessment & Plan: Visit Diagnoses:  1. Onychomycosis   2. Diabetic ulcer of right midfoot associated with diabetes mellitus due to underlying condition, limited to breakdown of skin ([place])     Plan: Ulcers were debrided x3 nails trimmed x10.  Reevaluate feet at follow-up.  Follow-Up Instructions: Return in about 3 months (around 10/14/2018).   Ortho Exam  Patient is alert, oriented, no adenopathy, well-dressed, normal affect, normal respiratory effort. Examination patient has an antalgic gait she has palpable pulses she has dorsiflexion to neutral bilaterally.  She has thickened discolored onychomycotic nails x10 which she is unable to safely trim on her own due to her diabetic insensate neuropathy and nails were trimmed F81 without complications.  Patient has Levan Hurst grade 1 ulcers beneath the metatarsal heads of both feet x4.  After informed consent a 10 blade knife was used to debride the skin and soft tissue back to healthy viable tissue the ulcers are 10 mm in diameter 1 mm deep no signs of infection no exposed bone or tendon.  Imaging: No results found. No images are attached to the encounter.  Labs: Lab Results  Component Value Date   HGBA1C 7.6 (H) 05/07/2015   HGBA1C 8.4 (H) 02/19/2015   HGBA1C 8.0 (H) 07/03/2014   REPTSTATUS 09/06/2015 FINAL  09/03/2015   GRAMSTAIN  01/18/2010    MODERATE WBC PRESENT,BOTH PMN AND MONONUCLEAR NO SQUAMOUS EPITHELIAL CELLS SEEN RARE GRAM POSITIVE COCCI IN PAIRS IN CLUSTERS   CULT >=100,000 COLONIES/mL ESCHERICHIA COLI (A) 09/03/2015   LABORGA ESCHERICHIA COLI (A) 09/03/2015     Lab Results  Component Value Date   ALBUMIN 3.4 (L) 04/28/2017   ALBUMIN 3.6 12/20/2015   ALBUMIN 3.2 (L) 09/03/2015    Body mass index is 26.31 kg/m.  Orders:  No orders of the defined types were placed in this encounter.  No orders of the defined types were placed in this encounter.    Procedures: No procedures performed  Clinical Data: No additional findings.  ROS:  All other systems negative, except as noted in the HPI. Review of Systems  Objective: Vital Signs: Ht 5\' 7"  (1.702 m)   Wt 168 lb (76.2 kg)   BMI 26.31 kg/m   Specialty Comments:  No specialty comments available.  PMFS History: Patient Active Problem List   Diagnosis Date Noted  . Chest pain 04/28/2017  . History of ST elevation myocardial infarction (STEMI) 04/28/2017  . Grief reaction 04/28/2017  . Osteoarthritis of left hip 05/18/2015  . Status post total replacement of left hip 05/18/2015  . Coronary artery disease involving native coronary artery of native heart with angina pectoris ([place]) 04/16/2015  . History of stroke 04/16/2015  . Type  2 diabetes mellitus with circulatory disorder ([place]) 04/16/2015  . Cerebrovascular 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 artery ([place]) 04/16/2015  . HLD (hyperlipidemia)   . Acute CVA (cerebrovascular accident) ([place]) 02/19/2015  . Transient ischemic attack (TIA) 02/18/2015  . Stroke ([place])   . Cerebral infarction due to thrombosis of right middle cerebral artery ([place])   . Acute myocardial infarction of inferolateral wall (HCC) 04/12/2014  . Numbness 03/18/2014  . TIA (transient ischemic attack) 03/18/2014  . DM (diabetes mellitus) type II uncontrolled, periph  vascular disorder ([place]) 03/18/2014  . Hyperlipidemia   . Hypertension   . PVD (peripheral vascular disease) ([place]) 03/05/2014  . Transient cerebral ischemia 08/01/2012  . Atherosclerosis of native arteries of extremity with intermittent claudication ([place]) 10/19/2011  . Unspecified disorders of arteries and arterioles 10/19/2011  . PAD (peripheral artery disease) ([place]) 04/13/2011   Past Medical History:  Diagnosis Date  . AAA (abdominal aortic aneurysm) ([place])   . Aneurysm ([place])   . Arthritis    osteoarthritis  . Constipation   . Diabetes mellitus    Type 2, on Glimiperide  . GERD (gastroesophageal reflux disease)   . Glaucoma   . Hyperlipidemia   . Hypertension   . Kidney stones   . Myocardial infarction ([place])   . Peripheral vascular disease ([place])   . Pneumonia   . Stroke ([place]) 01/2015   with left side weakness   . TIA (transient ischemic attack)   . Urinary incontinence     Family History  Problem Relation Age of Onset  . Diabetes Mother   . Stroke Mother   . Hypertension Father   . Stroke Father   . Hyperlipidemia Sister   . Hypertension Sister   . Aneurysm Sister   . Hyperlipidemia Brother   . Hypertension Brother     Past Surgical History:  Procedure Laterality Date  . ABDOMINAL AORTAGRAM N/A 03/13/2014   Procedure: ABDOMINAL Maxcine Ham;  Surgeon: Elam Dutch, MD;  Location: [facility] CATH LAB;  Service: Cardiovascular;  Laterality: N/A;  . ABDOMINAL AORTIC ANEURYSM REPAIR    . ABDOMINAL AORTIC ANEURYSM REPAIR  ?2012  . ABDOMINAL HYSTERECTOMY     partial  . BACK SURGERY    . CARDIAC CATHETERIZATION    . CORONARY ANGIOPLASTY  03/2015  . EYE SURGERY Right    laser surgery for blood behind eye, loss of sight  . FEMORAL-POPLITEAL BYPASS GRAFT Left 07/06/2014   Procedure: BYPASS GRAFT FEMORAL-POPLITEAL ARTERY;  Surgeon: Elam Dutch, MD;  Location: [place];  Service: Vascular;  Laterality: Left;  . HERNIA REPAIR    . JOINT REPLACEMENT    . LEFT HEART  CATHETERIZATION WITH CORONARY ANGIOGRAM N/A 04/12/2014   Procedure: LEFT HEART CATHETERIZATION WITH CORONARY ANGIOGRAM;  Surgeon: Clent Demark, MD;  Location: [facility] CATH LAB;  Service: Cardiovascular;  Laterality: N/A;  . PERCUTANEOUS CORONARY STENT INTERVENTION (PCI-S)  04/12/2014   Procedure: PERCUTANEOUS CORONARY STENT INTERVENTION (PCI-S);  Surgeon: Clent Demark, MD;  Location: [facility] CATH LAB;  Service: Cardiovascular;;  prox and mid RCA  . TOTAL HIP ARTHROPLASTY Left 05/18/2015  . TOTAL HIP ARTHROPLASTY Left 05/18/2015   Procedure: LEFT TOTAL HIP ARTHROPLASTY ANTERIOR APPROACH;  Surgeon: Mcarthur Rossetti, MD;  Location: [place];  Service: Orthopedics;  Laterality: Left;  . TOTAL KNEE ARTHROPLASTY Left ~ 2003  . TUBAL LIGATION    . uterine tumor    . VENTRAL HERNIA REPAIR     Social History   Occupational History  .  Not on file  Tobacco Use  . Smoking status: Current Every Day Smoker    Packs/day: 0.50    Years: 40.00    Pack years: 20.00    Types: Cigarettes  . Smokeless tobacco: Never Used  Substance and Sexual Activity  . Alcohol use: No    Alcohol/week: 0.0 standard drinks  . Drug use: No  . Sexual activity: Not on file

## 2018-07-23 DIAGNOSIS — D649 Anemia, unspecified: Secondary | ICD-10-CM | POA: Diagnosis not present

## 2018-10-14 ENCOUNTER — Ambulatory Visit: Payer: Self-pay | Admitting: Orthopedic Surgery

## 2019-02-10 DIAGNOSIS — I10 Essential (primary) hypertension: Secondary | ICD-10-CM | POA: Diagnosis not present

## 2019-02-10 DIAGNOSIS — E785 Hyperlipidemia, unspecified: Secondary | ICD-10-CM | POA: Diagnosis not present

## 2019-02-10 DIAGNOSIS — R0609 Other forms of dyspnea: Secondary | ICD-10-CM | POA: Diagnosis not present

## 2019-02-10 DIAGNOSIS — I639 Cerebral infarction, unspecified: Secondary | ICD-10-CM | POA: Diagnosis not present

## 2019-02-10 DIAGNOSIS — F419 Anxiety disorder, unspecified: Secondary | ICD-10-CM | POA: Diagnosis not present

## 2019-02-10 DIAGNOSIS — I739 Peripheral vascular disease, unspecified: Secondary | ICD-10-CM | POA: Diagnosis not present

## 2019-02-10 DIAGNOSIS — I25118 Atherosclerotic heart disease of native coronary artery with other forms of angina pectoris: Secondary | ICD-10-CM | POA: Diagnosis not present

## 2019-02-10 DIAGNOSIS — E119 Type 2 diabetes mellitus without complications: Secondary | ICD-10-CM | POA: Diagnosis not present

## 2019-02-10 DIAGNOSIS — I714 Abdominal aortic aneurysm, without rupture: Secondary | ICD-10-CM | POA: Diagnosis not present

## 2019-02-28 DIAGNOSIS — I10 Essential (primary) hypertension: Secondary | ICD-10-CM | POA: Diagnosis not present

## 2019-02-28 DIAGNOSIS — F17290 Nicotine dependence, other tobacco product, uncomplicated: Secondary | ICD-10-CM | POA: Diagnosis not present

## 2019-02-28 DIAGNOSIS — I639 Cerebral infarction, unspecified: Secondary | ICD-10-CM | POA: Diagnosis not present

## 2019-02-28 DIAGNOSIS — I714 Abdominal aortic aneurysm, without rupture: Secondary | ICD-10-CM | POA: Diagnosis not present

## 2019-02-28 DIAGNOSIS — E119 Type 2 diabetes mellitus without complications: Secondary | ICD-10-CM | POA: Diagnosis not present

## 2019-02-28 DIAGNOSIS — I25119 Atherosclerotic heart disease of native coronary artery with unspecified angina pectoris: Secondary | ICD-10-CM | POA: Diagnosis not present

## 2019-02-28 DIAGNOSIS — I739 Peripheral vascular disease, unspecified: Secondary | ICD-10-CM | POA: Diagnosis not present

## 2019-02-28 DIAGNOSIS — R0609 Other forms of dyspnea: Secondary | ICD-10-CM | POA: Diagnosis not present

## 2019-02-28 DIAGNOSIS — E785 Hyperlipidemia, unspecified: Secondary | ICD-10-CM | POA: Diagnosis not present

## 2019-03-12 DIAGNOSIS — I639 Cerebral infarction, unspecified: Secondary | ICD-10-CM | POA: Diagnosis not present

## 2019-03-12 DIAGNOSIS — I714 Abdominal aortic aneurysm, without rupture: Secondary | ICD-10-CM | POA: Diagnosis not present

## 2019-03-12 DIAGNOSIS — E785 Hyperlipidemia, unspecified: Secondary | ICD-10-CM | POA: Diagnosis not present

## 2019-03-12 DIAGNOSIS — I10 Essential (primary) hypertension: Secondary | ICD-10-CM | POA: Diagnosis not present

## 2019-03-12 DIAGNOSIS — F419 Anxiety disorder, unspecified: Secondary | ICD-10-CM | POA: Diagnosis not present

## 2019-03-12 DIAGNOSIS — I739 Peripheral vascular disease, unspecified: Secondary | ICD-10-CM | POA: Diagnosis not present

## 2019-03-12 DIAGNOSIS — E119 Type 2 diabetes mellitus without complications: Secondary | ICD-10-CM | POA: Diagnosis not present

## 2019-03-12 DIAGNOSIS — F17290 Nicotine dependence, other tobacco product, uncomplicated: Secondary | ICD-10-CM | POA: Diagnosis not present

## 2019-03-12 DIAGNOSIS — I25119 Atherosclerotic heart disease of native coronary artery with unspecified angina pectoris: Secondary | ICD-10-CM | POA: Diagnosis not present

## 2019-04-07 ENCOUNTER — Emergency Department (HOSPITAL_COMMUNITY): Payer: Medicare HMO

## 2019-04-07 ENCOUNTER — Ambulatory Visit (INDEPENDENT_AMBULATORY_CARE_PROVIDER_SITE_OTHER)
Admission: EM | Admit: 2019-04-07 | Discharge: 2019-04-07 | Disposition: A | Discharge status: Home / Self Care | Payer: Medicare HMO | Source: Home / Self Care

## 2019-04-07 ENCOUNTER — Other Ambulatory Visit: Payer: Self-pay

## 2019-04-07 ENCOUNTER — Encounter (HOSPITAL_COMMUNITY): Payer: Self-pay

## 2019-04-07 ENCOUNTER — Observation Stay (HOSPITAL_COMMUNITY): Payer: Medicare HMO

## 2019-04-07 ENCOUNTER — Observation Stay (HOSPITAL_COMMUNITY)
Admission: EM | Admit: 2019-04-07 | Discharge: 2019-04-08 | Disposition: A | Discharge status: Home / Self Care | Payer: Medicare HMO | Attending: Emergency Medicine | Admitting: Emergency Medicine

## 2019-04-07 DIAGNOSIS — I160 Hypertensive urgency: Principal | ICD-10-CM

## 2019-04-07 DIAGNOSIS — Z7902 Long term (current) use of antithrombotics/antiplatelets: Secondary | ICD-10-CM | POA: Insufficient documentation

## 2019-04-07 DIAGNOSIS — R42 Dizziness and giddiness: Secondary | ICD-10-CM | POA: Diagnosis not present

## 2019-04-07 DIAGNOSIS — E785 Hyperlipidemia, unspecified: Secondary | ICD-10-CM

## 2019-04-07 DIAGNOSIS — E1122 Type 2 diabetes mellitus with diabetic chronic kidney disease: Secondary | ICD-10-CM | POA: Insufficient documentation

## 2019-04-07 DIAGNOSIS — I25119 Atherosclerotic heart disease of native coronary artery with unspecified angina pectoris: Secondary | ICD-10-CM

## 2019-04-07 DIAGNOSIS — E1159 Type 2 diabetes mellitus with other circulatory complications: Secondary | ICD-10-CM | POA: Diagnosis present

## 2019-04-07 DIAGNOSIS — N183 Chronic kidney disease, stage 3 unspecified: Secondary | ICD-10-CM | POA: Diagnosis not present

## 2019-04-07 DIAGNOSIS — I252 Old myocardial infarction: Secondary | ICD-10-CM | POA: Diagnosis not present

## 2019-04-07 DIAGNOSIS — I161 Hypertensive emergency: Secondary | ICD-10-CM | POA: Diagnosis not present

## 2019-04-07 DIAGNOSIS — R079 Chest pain, unspecified: Secondary | ICD-10-CM

## 2019-04-07 DIAGNOSIS — F17210 Nicotine dependence, cigarettes, uncomplicated: Secondary | ICD-10-CM | POA: Diagnosis not present

## 2019-04-07 DIAGNOSIS — Z96642 Presence of left artificial hip joint: Secondary | ICD-10-CM | POA: Insufficient documentation

## 2019-04-07 DIAGNOSIS — E669 Obesity, unspecified: Secondary | ICD-10-CM | POA: Insufficient documentation

## 2019-04-07 DIAGNOSIS — Z8673 Personal history of transient ischemic attack (TIA), and cerebral infarction without residual deficits: Secondary | ICD-10-CM | POA: Diagnosis not present

## 2019-04-07 DIAGNOSIS — Z20828 Contact with and (suspected) exposure to other viral communicable diseases: Secondary | ICD-10-CM | POA: Diagnosis not present

## 2019-04-07 DIAGNOSIS — Z96652 Presence of left artificial knee joint: Secondary | ICD-10-CM | POA: Insufficient documentation

## 2019-04-07 DIAGNOSIS — I129 Hypertensive chronic kidney disease with stage 1 through stage 4 chronic kidney disease, or unspecified chronic kidney disease: Secondary | ICD-10-CM | POA: Diagnosis not present

## 2019-04-07 DIAGNOSIS — Z79899 Other long term (current) drug therapy: Secondary | ICD-10-CM | POA: Diagnosis not present

## 2019-04-07 DIAGNOSIS — R0789 Other chest pain: Secondary | ICD-10-CM | POA: Diagnosis present

## 2019-04-07 LAB — CBC
HCT: 35.5 % — ABNORMAL LOW (ref 36.0–46.0)
Hemoglobin: 11.7 g/dL — ABNORMAL LOW (ref 12.0–15.0)
MCH: 28.3 pg (ref 26.0–34.0)
MCHC: 33 g/dL (ref 30.0–36.0)
MCV: 85.7 fL (ref 80.0–100.0)
Platelets: 194 10*3/uL (ref 150–400)
RBC: 4.14 MIL/uL (ref 3.87–5.11)
RDW: 16.3 % — ABNORMAL HIGH (ref 11.5–15.5)
WBC: 6.8 10*3/uL (ref 4.0–10.5)
nRBC: 0 % (ref 0.0–0.2)

## 2019-04-07 LAB — BASIC METABOLIC PANEL
Anion gap: 8 (ref 5–15)
BUN: 17 mg/dL (ref 8–23)
CO2: 22 mmol/L (ref 22–32)
Calcium: 9.1 mg/dL (ref 8.9–10.3)
Chloride: 110 mmol/L (ref 98–111)
Creatinine, Ser: 1.43 mg/dL — ABNORMAL HIGH (ref 0.44–1.00)
GFR calc Af Amer: 40 mL/min — ABNORMAL LOW (ref >60)
GFR calc non Af Amer: 35 mL/min — ABNORMAL LOW (ref >60)
Glucose, Bld: 161 mg/dL — ABNORMAL HIGH (ref 70–99)
Potassium: 4.2 mmol/L (ref 3.5–5.1)
Sodium: 140 mmol/L (ref 135–145)

## 2019-04-07 LAB — TROPONIN I (HIGH SENSITIVITY)
Troponin I (High Sensitivity): 5 ng/L (ref <18)
Troponin I (High Sensitivity): 5 ng/L (ref <18)

## 2019-04-07 LAB — CBG MONITORING, ED: Glucose-Capillary: 90 mg/dL (ref 70–99)

## 2019-04-07 LAB — BRAIN NATRIURETIC PEPTIDE: B Natriuretic Peptide: 153.4 pg/mL — ABNORMAL HIGH (ref 0.0–100.0)

## 2019-04-07 MED ORDER — ONDANSETRON HCL 4 MG/2ML IJ SOLN: 4.0000 mg | Freq: Four times a day (QID) | INTRAMUSCULAR | Status: DC | PRN

## 2019-04-07 MED ORDER — ASPIRIN 81 MG PO CHEW
324.0000 mg | CHEWABLE_TABLET | Freq: Once | ORAL | Status: AC
  Administered 2019-04-07: 324 mg via ORAL
  Filled 2019-04-07: qty 4

## 2019-04-07 MED ORDER — AMLODIPINE BESYLATE 10 MG PO TABS
10.0000 mg | ORAL_TABLET | Freq: Every day | ORAL | Status: DC
  Administered 2019-04-08: 10 mg via ORAL
  Filled 2019-04-07: qty 1

## 2019-04-07 MED ORDER — NITROGLYCERIN IN D5W 200-5 MCG/ML-% IV SOLN
0.0000 ug/min | INTRAVENOUS | Status: DC
  Administered 2019-04-07: 5 ug/min via INTRAVENOUS
  Filled 2019-04-07: qty 250

## 2019-04-07 MED ORDER — LABETALOL HCL 5 MG/ML IV SOLN
10.0000 mg | INTRAVENOUS | Status: DC | PRN
  Administered 2019-04-07: 10 mg via INTRAVENOUS
  Filled 2019-04-07 (×2): qty 4

## 2019-04-07 MED ORDER — GLIMEPIRIDE 1 MG PO TABS: 2.0000 mg | ORAL_TABLET | Freq: Every day | ORAL | Status: DC

## 2019-04-07 MED ORDER — ISOSORBIDE MONONITRATE ER 60 MG PO TB24
60.0000 mg | ORAL_TABLET | Freq: Every day | ORAL | Status: DC
  Administered 2019-04-08: 60 mg via ORAL
  Filled 2019-04-07: qty 1

## 2019-04-07 MED ORDER — AMLODIPINE BESYLATE 5 MG PO TABS: 5.0000 mg | ORAL_TABLET | Freq: Two times a day (BID) | ORAL | Status: DC

## 2019-04-07 MED ORDER — LOSARTAN POTASSIUM 50 MG PO TABS
100.0000 mg | ORAL_TABLET | Freq: Every day | ORAL | Status: DC
  Administered 2019-04-07: 100 mg via ORAL
  Filled 2019-04-07: qty 2

## 2019-04-07 MED ORDER — METOPROLOL SUCCINATE ER 50 MG PO TB24
50.0000 mg | ORAL_TABLET | Freq: Every day | ORAL | Status: DC
  Administered 2019-04-08: 50 mg via ORAL
  Filled 2019-04-07: qty 1

## 2019-04-07 MED ORDER — CLOPIDOGREL BISULFATE 75 MG PO TABS
75.0000 mg | ORAL_TABLET | Freq: Every day | ORAL | Status: DC
  Administered 2019-04-08: 75 mg via ORAL
  Filled 2019-04-07: qty 1

## 2019-04-07 MED ORDER — HYDRALAZINE HCL 20 MG/ML IJ SOLN
10.0000 mg | Freq: Once | INTRAMUSCULAR | Status: AC
  Administered 2019-04-07: 10 mg via INTRAVENOUS
  Filled 2019-04-07: qty 1

## 2019-04-07 MED ORDER — ONDANSETRON HCL 4 MG PO TABS: 4.0000 mg | ORAL_TABLET | Freq: Four times a day (QID) | ORAL | Status: DC | PRN

## 2019-04-07 MED ORDER — ENOXAPARIN SODIUM 40 MG/0.4ML ~~LOC~~ SOLN: 40.0000 mg | SUBCUTANEOUS | Status: DC

## 2019-04-07 MED ORDER — ACETAMINOPHEN 325 MG PO TABS
650.0000 mg | ORAL_TABLET | Freq: Four times a day (QID) | ORAL | Status: DC | PRN
  Administered 2019-04-08: 650 mg via ORAL
  Filled 2019-04-07: qty 2

## 2019-04-07 MED ORDER — GLIMEPIRIDE 2 MG PO TABS: 2.0000 mg | ORAL_TABLET | Freq: Two times a day (BID) | ORAL | Status: DC

## 2019-04-07 MED ORDER — ACETAMINOPHEN 650 MG RE SUPP: 650.0000 mg | Freq: Four times a day (QID) | RECTAL | Status: DC | PRN

## 2019-04-07 MED ORDER — ALBUTEROL SULFATE (2.5 MG/3ML) 0.083% IN NEBU: 2.5000 mg | INHALATION_SOLUTION | RESPIRATORY_TRACT | Status: DC | PRN

## 2019-04-07 MED ORDER — GLIMEPIRIDE 1 MG PO TABS
3.0000 mg | ORAL_TABLET | Freq: Every day | ORAL | Status: DC
  Administered 2019-04-08: 3 mg via ORAL
  Filled 2019-04-07: qty 1
  Filled 2019-04-07: qty 3

## 2019-04-07 MED ORDER — NICOTINE 7 MG/24HR TD PT24
7.0000 mg | MEDICATED_PATCH | Freq: Once | TRANSDERMAL | Status: DC
  Administered 2019-04-07: 7 mg via TRANSDERMAL
  Filled 2019-04-07: qty 1

## 2019-04-07 NOTE — ED Notes (Signed)
Spoke with [employer], PA.  Hallie, PA has reviewed EKG and spoken to [facility] about patient and has requested patient go to ED by wheelchair and CMA

## 2019-04-07 NOTE — H&P (Signed)
History and Physical    [name] WCH:[id number] DOB: 02-07-1941 DOA: 04/07/2019  PCP: [name], MD  Patient coming from: Home  I have personally briefly reviewed patient's old medical records in [place]  Chief Complaint: CP, SOB  HPI: [name] is a 78 y.o. female with medical history significant of CAD s/p stent, CVA, DM2, HTN, PAD.  Patient presents to the ED with c/o L sided CP.  5 min short episode earlier today.  Severe, 10/10 pain when it was present, resolved currently.  Radiation to L arm, neck, and mid back.  Has ongoing SOB today.  Has been having dizziness for past 1 week.   ED Course: BP 223/81, persistently systolics > 353.  Given 10 hydralazine and 10 labetalol, SBP 190s, still SOB.  No O2 requirement, no fever, CXR clear.   Review of Systems: As per HPI, otherwise all review of systems negative.  Past Medical History:  Diagnosis Date  . AAA (abdominal aortic aneurysm) ([place])   . Aneurysm ([place])   . Arthritis    osteoarthritis  . Constipation   . Diabetes mellitus    Type 2, on Glimiperide  . GERD (gastroesophageal reflux disease)   . Glaucoma   . Hyperlipidemia   . Hypertension   . Kidney stones   . Myocardial infarction ([place])   . Peripheral vascular disease ([place])   . Pneumonia   . Stroke ([place]) 01/2015   with left side weakness   . TIA (transient ischemic attack)   . Urinary incontinence     Past Surgical History:  Procedure Laterality Date  . ABDOMINAL AORTAGRAM N/A 03/13/2014   Procedure: ABDOMINAL Maxcine Ham;  Surgeon: Elam Dutch, MD;  Location: [facility] CATH LAB;  Service: Cardiovascular;  Laterality: N/A;  . ABDOMINAL AORTIC ANEURYSM REPAIR    . ABDOMINAL AORTIC ANEURYSM REPAIR  ?2012  . ABDOMINAL HYSTERECTOMY     partial  . BACK SURGERY    . CARDIAC CATHETERIZATION    . CORONARY ANGIOPLASTY  03/2015  . EYE SURGERY Right    laser surgery for blood behind eye, loss of sight  . FEMORAL-POPLITEAL BYPASS GRAFT  Left 07/06/2014   Procedure: BYPASS GRAFT FEMORAL-POPLITEAL ARTERY;  Surgeon: Elam Dutch, MD;  Location: [facility];  Service: Vascular;  Laterality: Left;  . HERNIA REPAIR    . JOINT REPLACEMENT    . LEFT HEART CATHETERIZATION WITH CORONARY ANGIOGRAM N/A 04/12/2014   Procedure: LEFT HEART CATHETERIZATION WITH CORONARY ANGIOGRAM;  Surgeon: Clent Demark, MD;  Location: [facility] CATH LAB;  Service: Cardiovascular;  Laterality: N/A;  . PERCUTANEOUS CORONARY STENT INTERVENTION (PCI-S)  04/12/2014   Procedure: PERCUTANEOUS CORONARY STENT INTERVENTION (PCI-S);  Surgeon: Clent Demark, MD;  Location: [facility] CATH LAB;  Service: Cardiovascular;;  prox and mid RCA  . TOTAL HIP ARTHROPLASTY Left 05/18/2015  . TOTAL HIP ARTHROPLASTY Left 05/18/2015   Procedure: LEFT TOTAL HIP ARTHROPLASTY ANTERIOR APPROACH;  Surgeon: Mcarthur Rossetti, MD;  Location: [facility];  Service: Orthopedics;  Laterality: Left;  . TOTAL KNEE ARTHROPLASTY Left ~ 2003  . TUBAL LIGATION    . uterine tumor    . VENTRAL HERNIA REPAIR       reports that she has been smoking cigarettes. She has a 20.00 pack-year smoking history. She has never used smokeless tobacco. She reports that she does not drink alcohol or use drugs.  Allergies  Allergen Reactions  . Keflex [Cephalexin] Shortness Of Breath and Other (See Comments)    Chest pain, headache  .  Lipitor [Atorvastatin] Shortness Of Breath and Other (See Comments)    Severe pain/cramping in legs- "I cannot walk, talk, or breathe"  . Codeine Other (See Comments)    Disrupted patient's equilibrium- "Made my world flip upside down"  . Azor [Amlodipine-Olmesartan] Swelling, Palpitations and Rash    Amlodipine alone resumed 12/2013, tolerating  . Lisinopril Cough  . Penicillins Dermatitis and Other (See Comments)    Did it involve swelling of the face/tongue/throat, SOB, or low BP? No, just worsened eczema Did it involve sudden or severe rash/hives, skin peeling, or any reaction on the  inside of your mouth or nose? No Did you need to seek medical attention at a hospital or doctor's office? No When did it last happen? "More than 10 years ago" If all above answers are "NO", may proceed with cephalosporin use.     Family History  Problem Relation Age of Onset  . Diabetes Mother   . Stroke Mother   . Hypertension Father   . Stroke Father   . Hyperlipidemia Sister   . Hypertension Sister   . Aneurysm Sister   . Hyperlipidemia Brother   . Hypertension Brother      Prior to Admission medications   Medication Sig Start Date End Date Taking? Authorizing Provider  acetaminophen (TYLENOL) 650 MG CR tablet Take 650 mg by mouth daily.   Yes [provider]  amLODipine (NORVASC) 5 MG tablet Take 5 mg by mouth 2 (two) times daily.   Yes [provider]  clopidogrel (PLAVIX) 75 MG tablet Take 75 mg by mouth daily.   Yes [provider]  glimepiride (AMARYL) 2 MG tablet Take 2-3 mg by mouth 2 (two) times daily. Take 3 mg by mouth in the morning and 2 mg at bedtime   Yes [provider]  isosorbide mononitrate (IMDUR) 60 MG 24 hr tablet Take 60 mg by mouth daily. 02/24/15  Yes [provider]  losartan (COZAAR) 100 MG tablet Take 100 mg by mouth daily.   Yes [provider]  metoprolol (TOPROL-XL) 50 MG 24 hr tablet Take 50 mg by mouth daily.    Yes [provider]  nitroGLYCERIN (NITROSTAT) 0.4 MG SL tablet Place 0.4 mg under the tongue every 5 (five) minutes as needed for chest pain.   Yes [provider]  NON FORMULARY Place 1-2 drops into both eyes See admin instructions. [employer] Advanced Eye Relief Dry Eye Lubricant Eye Drops: Instill 1-2 drops into each eye one to two times a day   Yes [provider]  albuterol (PROVENTIL HFA;VENTOLIN HFA) 108 (90 Base) MCG/ACT inhaler Inhale 2 puffs into the lungs every 4 (four) hours as needed for wheezing or shortness of breath (or coughing). 9/56/38    Delora Fuel, MD  baclofen (LIORESAL) 10 MG tablet Take 0.5-1 tablets (5-10 mg total) by mouth 3 (three) times daily as needed for muscle spasms. Patient not taking: Reported on 04/07/2019 02/08/18   Hilts, Legrand Como, MD  vitamin B-12 (CYANOCOBALAMIN) 50 MCG tablet Take 50 mcg by mouth daily.    [provider]    Physical Exam: Vitals:   04/07/19 1815 04/07/19 1845 04/07/19 1907 04/07/19 1915  BP: (!) 210/88 (!) 212/75 (!) 223/81 (!) 197/88  Pulse: 75 83 74 73  Resp:   16 18  Temp:      TempSrc:      SpO2: 97% 97% 98% 98%    Constitutional: NAD, calm, comfortable Eyes: PERRL, lids and conjunctivae normal  ENMT: Mucous membranes are moist. Posterior pharynx clear of any exudate or lesions.Normal dentition.  Neck: normal, supple, no masses, no thyromegaly Respiratory: clear to auscultation bilaterally, no wheezing, no crackles. Normal respiratory effort. No accessory muscle use.  Cardiovascular: Regular rate and rhythm, no murmurs / rubs / gallops. No extremity edema. 2+ pedal pulses. No carotid bruits.  Abdomen: no tenderness, no masses palpated. No hepatosplenomegaly. Bowel sounds positive.  Musculoskeletal: no clubbing / cyanosis. No joint deformity upper and lower extremities. Good ROM, no contractures. Normal muscle tone.  Skin: no rashes, lesions, ulcers. No induration Neurologic: CN 2-12 grossly intact. Sensation intact, DTR normal. Strength 5/5 in all 4.  Psychiatric: Normal judgment and insight. Alert and oriented x 3. Normal mood.    Labs on Admission: I have personally reviewed following labs and imaging studies  CBC: Recent Labs  Lab 04/07/19 1254  WBC 6.8  HGB 11.7*  HCT 35.5*  MCV 85.7  PLT 846   Basic Metabolic Panel: Recent Labs  Lab 04/07/19 1254  NA 140  K 4.2  CL 110  CO2 22  GLUCOSE 161*  BUN 17  CREATININE 1.43*  CALCIUM 9.1   GFR: CrCl cannot be calculated (Unknown ideal weight.). Liver Function Tests: No results for input(s): AST,  ALT, ALKPHOS, BILITOT, PROT, ALBUMIN in the last 168 hours. No results for input(s): LIPASE, AMYLASE in the last 168 hours. No results for input(s): AMMONIA in the last 168 hours. Coagulation Profile: No results for input(s): INR, PROTIME in the last 168 hours. Cardiac Enzymes: No results for input(s): CKTOTAL, CKMB, CKMBINDEX, TROPONINI in the last 168 hours. BNP (last 3 results) No results for input(s): PROBNP in the last 8760 hours. HbA1C: No results for input(s): HGBA1C in the last 72 hours. CBG: Recent Labs  Lab 04/07/19 1922  GLUCAP 90   Lipid Profile: No results for input(s): CHOL, HDL, LDLCALC, TRIG, CHOLHDL, LDLDIRECT in the last 72 hours. Thyroid Function Tests: No results for input(s): TSH, T4TOTAL, FREET4, T3FREE, THYROIDAB in the last 72 hours. Anemia Panel: No results for input(s): VITAMINB12, FOLATE, FERRITIN, TIBC, IRON, RETICCTPCT in the last 72 hours. Urine analysis:    Component Value Date/Time   COLORURINE RED (A) 09/03/2015 2312   APPEARANCEUR TURBID (A) 09/03/2015 2312   LABSPEC 1.011 09/03/2015 2312   PHURINE 5.5 09/03/2015 2312   GLUCOSEU NEGATIVE 09/03/2015 2312   HGBUR LARGE (A) 09/03/2015 2312   BILIRUBINUR NEGATIVE 09/03/2015 2312   KETONESUR 15 (A) 09/03/2015 2312   PROTEINUR 100 (A) 09/03/2015 2312   UROBILINOGEN 0.2 07/04/2014 0319   NITRITE NEGATIVE 09/03/2015 2312   LEUKOCYTESUR LARGE (A) 09/03/2015 2312    Radiological Exams on Admission: Dg Chest 2 View  Result Date: 04/07/2019 CLINICAL DATA:  Chest pain. EXAM: CHEST - 2 VIEW COMPARISON:  May 28, 2018. FINDINGS: The heart size and mediastinal contours are within normal limits. Both lungs are clear. No pneumothorax or pleural effusion is noted. The visualized skeletal structures are unremarkable. IMPRESSION: No active cardiopulmonary disease. Electronically Signed   By: Marijo Conception M.D.   On: 04/07/2019 13:25    EKG: Independently reviewed.  Assessment/Plan Principal Problem:    Hypertensive urgency Active Problems:   HLD (hyperlipidemia)   Coronary artery disease involving native coronary artery of native heart with angina pectoris (HCC)   History of stroke   Type 2 diabetes mellitus with circulatory disorder (HCC)   History of ST elevation myocardial infarction (STEMI)   CKD (chronic kidney disease) stage 3, GFR 30-59  ml/min    1. HTN Urgency - 1. Cont home BP meds 2. Labetalol 10mg  Q1H PRN 3. Add hydralazine if HR drops 4. Checking CT head given the 1 week h/o dizziness and h/o stroke 2. CAD, h/o stroke - 1. Cont plavix 3. DM2 - 1. Cont Amaryl 4. CKD stage 3 - 1. Creat up slightly from 1.2 baseline last year to 1.4 today. 2. Repeat BMP in AM  DVT prophylaxis: Lovenox Code Status: Full Family Communication: No family in room Disposition Plan: Home after admit Consults called: None Admission status: Place in 43    [place], [place] Hospital[facility]  How to contact the [facility] Attending or Consulting provider [place] or covering provider during after hours [place], for this patient?  1. Check the care team in [facility] and look for a) attending/consulting TR[facility] provider listed and b) the [facility] team listed 2. Log into [email or web address]  Amion Physician Scheduling and messaging for groups and whole hospitals  On call and physician scheduling software for group practices, residents, hospitalists and other medical providers for call, clinic, rotation and shift schedules. OnCall Enterprise is a hospital-wide system for scheduling doctors and paging doctors on call. EasyPlot is for scientific plotting and data analysis.  [email or web address]  and use [facility]'s universal password to access. If you do not have the password, please contact the hospital operator.  3. Locate the [facility] provider you are looking for under [facility] and page to a number that you can be directly reached. 4. If you still have difficulty reaching the provider, please page the [facility]  (Director on Call) for the Hospitalists listed on amion for assistance.  04/07/2019, 7:44 PM

## 2019-04-07 NOTE — ED Notes (Signed)
ED TO INPATIENT HANDOFF REPORT  ED Nurse Name and Phone #: William Hamburger, RN [phone]  S Name/Age/Gender [name] 78 y.o. female Room/Bed: 019C/019C  Code Status   Code Status: Prior  Home/SNF/Other Home Patient oriented to: self, place, time and situation Is this baseline? No   Triage Complete: Triage complete  Chief Complaint CP  Triage Note Pt here from Urgent Care, sts last night she had a buzzing in her ear then woke up dizzy again this morning, as she has been for one week, so she went to Urgent Care. On the way to urgent care pt had pressure in her L chest with radiation to L arm. Pt takes Plavix and this morning her gums are bleeding. Hx stent placement three years ago.    Allergies Allergies  Allergen Reactions  . Keflex [Cephalexin] Shortness Of Breath and Other (See Comments)    Chest pain, headache  . Lipitor [Atorvastatin] Shortness Of Breath and Other (See Comments)    Severe pain/cramping in legs- "I cannot walk, talk, or breathe"  . Codeine Other (See Comments)    Disrupted patient's equilibrium- "Made my world flip upside down"  . Azor [Amlodipine-Olmesartan] Swelling, Palpitations and Rash    Amlodipine alone resumed 12/2013, tolerating  . Lisinopril Cough  . Penicillins Dermatitis and Other (See Comments)    Did it involve swelling of the face/tongue/throat, SOB, or low BP? No, just worsened eczema Did it involve sudden or severe rash/hives, skin peeling, or any reaction on the inside of your mouth or nose? No Did you need to seek medical attention at a hospital or doctor's office? No When did it last happen? "More than 10 years ago" If all above answers are "NO", may proceed with cephalosporin use.     Level of Care/Admitting Diagnosis ED Disposition    ED Disposition Condition Comment   Admit  The patient appears reasonably stabilized for admission considering the current resources, flow, and capabilities available in the ED at this time, and I  doubt any other [facility] requiring further screening and/or treatment in the ED prior to admission is  present.       B Medical/Surgery History Past Medical History:  Diagnosis Date  . AAA (abdominal aortic aneurysm) ([place])   . Aneurysm ([place])   . Arthritis    osteoarthritis  . Constipation   . Diabetes mellitus    Type 2, on Glimiperide  . GERD (gastroesophageal reflux disease)   . Glaucoma   . Hyperlipidemia   . Hypertension   . Kidney stones   . Myocardial infarction ([place])   . Peripheral vascular disease ([place])   . Pneumonia   . Stroke ([place]) 01/2015   with left side weakness   . TIA (transient ischemic attack)   . Urinary incontinence    Past Surgical History:  Procedure Laterality Date  . ABDOMINAL AORTAGRAM N/A 03/13/2014   Procedure: ABDOMINAL Maxcine Ham;  Surgeon: Elam Dutch, MD;  Location: [facility] CATH LAB;  Service: Cardiovascular;  Laterality: N/A;  . ABDOMINAL AORTIC ANEURYSM REPAIR    . ABDOMINAL AORTIC ANEURYSM REPAIR  ?2012  . ABDOMINAL HYSTERECTOMY     partial  . BACK SURGERY    . CARDIAC CATHETERIZATION    . CORONARY ANGIOPLASTY  03/2015  . EYE SURGERY Right    laser surgery for blood behind eye, loss of sight  . FEMORAL-POPLITEAL BYPASS GRAFT Left 07/06/2014   Procedure: BYPASS GRAFT FEMORAL-POPLITEAL ARTERY;  Surgeon: Elam Dutch, MD;  Location: [facility];  Service: Vascular;  Laterality: Left;  . HERNIA REPAIR    . JOINT REPLACEMENT    . LEFT HEART CATHETERIZATION WITH CORONARY ANGIOGRAM N/A 04/12/2014   Procedure: LEFT HEART CATHETERIZATION WITH CORONARY ANGIOGRAM;  Surgeon: Clent Demark, MD;  Location: [facility] CATH LAB;  Service: Cardiovascular;  Laterality: N/A;  . PERCUTANEOUS CORONARY STENT INTERVENTION (PCI-S)  04/12/2014   Procedure: PERCUTANEOUS CORONARY STENT INTERVENTION (PCI-S);  Surgeon: Clent Demark, MD;  Location: [facility] CATH LAB;  Service: Cardiovascular;;  prox and mid RCA  . TOTAL HIP ARTHROPLASTY Left 05/18/2015  . TOTAL HIP ARTHROPLASTY Left  05/18/2015   Procedure: LEFT TOTAL HIP ARTHROPLASTY ANTERIOR APPROACH;  Surgeon: Mcarthur Rossetti, MD;  Location: [facility];  Service: Orthopedics;  Laterality: Left;  . TOTAL KNEE ARTHROPLASTY Left ~ 2003  . TUBAL LIGATION    . uterine tumor    . VENTRAL HERNIA REPAIR       A IV Location/Drains/Wounds Patient Lines/Drains/Airways Status   Active Line/Drains/Airways    Name:   Placement date:   Placement time:   Site:   Days:   Peripheral IV 04/07/19 Right;Anterior Forearm   04/07/19    1645    Forearm   less than 1   Incision (Closed) 07/06/14 Leg Left   07/06/14    0925     1736   Incision (Closed) 05/18/15 Hip Left   05/18/15    1423     1420   Wound / Incision (Open or Dehisced) 07/03/14 Diabetic ulcer Toe (Comment  which one) Left Left foot great toe necrosis    07/03/14    1345    Toe (Comment  which one)   1739          Intake/Output Last 24 hours No intake or output data in the 24 hours ending 04/07/19 1908  Labs/Imaging Results for orders placed or performed during the hospital encounter of 04/07/19 (from the past 48 hour(s))  Basic metabolic panel     Status: Abnormal   Collection Time: 04/07/19 12:54 PM  Result Value Ref Range   Sodium 140 135 - 145 mmol/L   Potassium 4.2 3.5 - 5.1 mmol/L   Chloride 110 98 - 111 mmol/L   CO2 22 22 - 32 mmol/L   Glucose, Bld 161 (H) 70 - 99 mg/dL   BUN 17 8 - 23 mg/dL   Creatinine, Ser 1.43 (H) 0.44 - 1.00 mg/dL   Calcium 9.1 8.9 - 10.3 mg/dL   GFR calc non Af Amer 35 (L) >60 mL/min   GFR calc Af Amer 40 (L) >60 mL/min   Anion gap 8 5 - 15    Comment: Performed at [facility], [address], [place], [place] [address]  CBC     Status: Abnormal   Collection Time: 04/07/19 12:54 PM  Result Value Ref Range   WBC 6.8 4.0 - 10.5 K/uL   RBC 4.14 3.87 - 5.11 MIL/uL   Hemoglobin 11.7 (L) 12.0 - 15.0 g/dL   HCT 35.5 (L) 36.0 - 46.0 %   MCV 85.7 80.0 - 100.0 fL   MCH 28.3 26.0 - 34.0 pg   MCHC 33.0 30.0 - 36.0 g/dL   RDW  16.3 (H) 11.5 - 15.5 %   Platelets 194 150 - 400 K/uL   nRBC 0.0 0.0 - 0.2 %    Comment: Performed at [facility], [place] [address] [place], [place] [address]  Troponin I (High Sensitivity)  Status: None   Collection Time: 04/07/19 12:54 PM  Result Value Ref Range   Troponin I (High Sensitivity) 5 <18 ng/L    Comment: (NOTE) Elevated high sensitivity troponin I (hsTnI) values and significant  changes across serial measurements may suggest ACS but many other  chronic and acute conditions are known to elevate hsTnI results.  Refer to the "Links" section for chest pain algorithms and additional  guidance. Performed at [facility], [place] [address], [place], [place] [address]   Brain natriuretic peptide     Status: Abnormal   Collection Time: 04/07/19  3:13 PM  Result Value Ref Range   B Natriuretic Peptide 153.4 (H) 0.0 - 100.0 pg/mL    Comment: Performed at [facility] [address], [place], [place] [address]  Troponin I (High Sensitivity)     Status: None   Collection Time: 04/07/19  4:00 PM  Result Value Ref Range   Troponin I (High Sensitivity) 5 <18 ng/L    Comment: (NOTE) Elevated high sensitivity troponin I (hsTnI) values and significant  changes across serial measurements may suggest ACS but many other  chronic and acute conditions are known to elevate hsTnI results.  Refer to the "Links" section for chest pain algorithms and additional  guidance. Performed at [facility], [place] [address], [place], [place] [address]    Dg Chest 2 View  Result Date: 04/07/2019 CLINICAL DATA:  Chest pain. EXAM: CHEST - 2 VIEW COMPARISON:  May 28, 2018. FINDINGS: The heart size and mediastinal contours are within normal limits. Both lungs are clear. No pneumothorax or pleural effusion is noted. The visualized skeletal structures are unremarkable. IMPRESSION: No active cardiopulmonary disease. Electronically Signed   By: Marijo Conception M.D.   On: 04/07/2019  13:25    Pending Labs Unresulted Labs (From admission, onward)    Start     Ordered   04/07/19 1848  SARS CORONAVIRUS 2 (TAT 6-24 HRS) Nasopharyngeal Nasopharyngeal Swab  (Symptomatic/High Risk of Exposure/Tier 1 Patients Labs with Precautions)  ONCE - STAT,   STAT    Question Answer Comment  Is this test for diagnosis or screening Diagnosis of ill patient   Symptomatic for COVID-19 as defined by CDC Yes   Date of Symptom Onset 04/07/2019   Hospitalized for COVID-19 Yes   Admitted to ICU for COVID-19 No   Previously tested for COVID-19 No   Resident in a congregate (group) care setting No   Employed in healthcare setting No   Pregnant No      04/07/19 1847          Vitals/Pain Today's Vitals   04/07/19 1745 04/07/19 1800 04/07/19 1815 04/07/19 1907  BP: (!) 207/74 (!) 199/151 (!) 210/88 (!) 223/81  Pulse: 65 77 75 74  Resp:    16  Temp:      TempSrc:      SpO2: 98% 100% 97% 98%  PainSc:        Isolation Precautions Airborne and Contact precautions  Medications Medications  nitroGLYCERIN 50 mg in dextrose 5 % 250 mL (0.2 mg/mL) infusion (5 mcg/min Intravenous New Bag/Given 04/07/19 1907)  nicotine (NICODERM CQ - dosed in mg/24 hr) patch 7 mg (has no administration in time range)  aspirin chewable tablet 324 mg (324 mg Oral Given 04/07/19 1540)  hydrALAZINE (APRESOLINE) injection 10 mg (10 mg Intravenous Given 04/07/19 1703)    Mobility walks with person assist Moderate fall risk   Focused Assessments  Cardiac Assessment Handoff:    Lab Results  Component Value Date   CKTOTAL 51 04/12/2014   CKMB 1.2 04/12/2014   TROPONINI 2.19 (HH) 04/15/2014   Lab Results  Component Value Date   DDIMER 3.81 (H) 04/28/2017   Does the Patient currently have chest pain? No     R Recommendations: See Admitting Provider Note  Report given to:   Additional Notes:

## 2019-04-07 NOTE — ED Provider Notes (Signed)
[facility] EMERGENCY DEPARTMENT Provider Note   CSN: [id number] Arrival date & time: 04/07/19  1242     History   Chief Complaint No chief complaint on file.   HPI [name] is a 78 y.o. female.   The history is provided by the patient.  Chest Pain Pain location:  L chest Pain radiates to:  L arm, neck and mid back Pain severity now: Currently no pain, 10/10 pain when present. Duration:  5 minutes Progression:  Resolved Chronicity:  Recurrent Relieved by:  Rest Worsened by:  Exertion Associated symptoms: nausea and shortness of breath   Associated symptoms: no fever, no syncope and no vomiting   Risk factors: coronary artery disease (1 stent), diabetes mellitus, hypertension, prior DVT/PE and smoking   Risk factors: not female    Patient had about 5 minutes of chest pain around 10:15 AM.  She has since felt release, ringing in her right ear that has been present over the last 2 days that may be related to blood pressure symptoms, increased swelling in her bilateral lower extremities.  She denies any missed doses of her medications.  She takes Plavix at home daily.  Past Medical History:  Diagnosis Date  . AAA (abdominal aortic aneurysm) ([place])   . Aneurysm ([place])   . Arthritis    osteoarthritis  . Constipation   . Diabetes mellitus    Type 2, on Glimiperide  . GERD (gastroesophageal reflux disease)   . Glaucoma   . Hyperlipidemia   . Hypertension   . Kidney stones   . Myocardial infarction ([place])   . Peripheral vascular disease ([place])   . Pneumonia   . Stroke ([place]) 01/2015   with left side weakness   . TIA (transient ischemic attack)   . Urinary incontinence     Patient Active Problem List   Diagnosis Date Noted  . Hypertensive urgency 04/07/2019  . CKD (chronic kidney disease) stage 3, GFR 30-59 ml/min 04/07/2019  . Chest pain 04/28/2017  . History of ST elevation myocardial infarction (STEMI) 04/28/2017  . Grief reaction 04/28/2017  .  Osteoarthritis of left hip 05/18/2015  . Status post total replacement of left hip 05/18/2015  . Coronary artery disease involving native coronary artery of native heart with angina pectoris ([place]) 04/16/2015  . History of stroke 04/16/2015  . Type 2 diabetes mellitus with circulatory disorder ([place]) 04/16/2015  . Cerebrovascular 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 artery ([place]) 04/16/2015  . HLD (hyperlipidemia)   . Acute CVA (cerebrovascular accident) ([place]) 02/19/2015  . Transient ischemic attack (TIA) 02/18/2015  . Stroke ([place])   . Cerebral infarction due to thrombosis of right middle cerebral artery ([place])   . Acute myocardial infarction of inferolateral wall (H[facility]) 04/12/2014  . Numbness 03/18/2014  . TIA (transient ischemic attack) 03/18/2014  . DM (diabetes mellitus) type II uncontrolled, periph vascular disorder ([place]) 03/18/2014  . Hyperlipidemia   . Hypertension   . PVD (peripheral vascular disease) ([place]) 03/05/2014  . Transient cerebral ischemia 08/01/2012  . Atherosclerosis of native arteries of extremity with intermittent claudication ([place]) 10/19/2011  . Unspecified disorders of arteries and arterioles 10/19/2011  . PAD (peripheral artery disease) ([place]) 04/13/2011    Past Surgical History:  Procedure Laterality Date  . ABDOMINAL AORTAGRAM N/A 03/13/2014   Procedure: ABDOMINAL Maxcine Ham;  Surgeon: Elam Dutch, MD;  Location: [facility] CATH LAB;  Service: Cardiovascular;  Laterality: N/A;  . ABDOMINAL AORTIC ANEURYSM REPAIR    . ABDOMINAL  AORTIC ANEURYSM REPAIR  ?2012  . ABDOMINAL HYSTERECTOMY     partial  . BACK SURGERY    . CARDIAC CATHETERIZATION    . CORONARY ANGIOPLASTY  03/2015  . EYE SURGERY Right    laser surgery for blood behind eye, loss of sight  . FEMORAL-POPLITEAL BYPASS GRAFT Left 07/06/2014   Procedure: BYPASS GRAFT FEMORAL-POPLITEAL ARTERY;  Surgeon: Elam Dutch, MD;  Location: [facility];  Service: Vascular;  Laterality: Left;  . HERNIA  REPAIR    . JOINT REPLACEMENT    . LEFT HEART CATHETERIZATION WITH CORONARY ANGIOGRAM N/A 04/12/2014   Procedure: LEFT HEART CATHETERIZATION WITH CORONARY ANGIOGRAM;  Surgeon: Clent Demark, MD;  Location: [facility] CATH LAB;  Service: Cardiovascular;  Laterality: N/A;  . PERCUTANEOUS CORONARY STENT INTERVENTION (PCI-S)  04/12/2014   Procedure: PERCUTANEOUS CORONARY STENT INTERVENTION (PCI-S);  Surgeon: Clent Demark, MD;  Location: [facility] CATH LAB;  Service: Cardiovascular;;  prox and mid RCA  . TOTAL HIP ARTHROPLASTY Left 05/18/2015  . TOTAL HIP ARTHROPLASTY Left 05/18/2015   Procedure: LEFT TOTAL HIP ARTHROPLASTY ANTERIOR APPROACH;  Surgeon: Mcarthur Rossetti, MD;  Location: [facility];  Service: Orthopedics;  Laterality: Left;  . TOTAL KNEE ARTHROPLASTY Left ~ 2003  . TUBAL LIGATION    . uterine tumor    . VENTRAL HERNIA REPAIR       OB History   No obstetric history on file.      Home Medications    Prior to Admission medications   Medication Sig Start Date End Date Taking? Authorizing Provider  acetaminophen (TYLENOL) 650 MG CR tablet Take 650 mg by mouth daily.   Yes [provider]  amLODipine (NORVASC) 5 MG tablet Take 5 mg by mouth 2 (two) times daily.   Yes [provider]  clopidogrel (PLAVIX) 75 MG tablet Take 75 mg by mouth daily.   Yes [provider]  glimepiride (AMARYL) 2 MG tablet Take 2-3 mg by mouth 2 (two) times daily. Take 3 mg by mouth in the morning and 2 mg at bedtime   Yes [provider]  isosorbide mononitrate (IMDUR) 60 MG 24 hr tablet Take 60 mg by mouth daily. 02/24/15  Yes [provider]  losartan (COZAAR) 100 MG tablet Take 100 mg by mouth daily.   Yes [provider]  metoprolol (TOPROL-XL) 50 MG 24 hr tablet Take 50 mg by mouth daily.    Yes [provider]  nitroGLYCERIN (NITROSTAT) 0.4 MG SL tablet Place 0.4 mg under the tongue every 5 (five) minutes as needed for chest pain.   Yes [provider]  NON FORMULARY Place 1-2 drops into both eyes See admin instructions. [employer] Advanced Eye Relief Dry Eye Lubricant Eye Drops: Instill 1-2 drops into each eye one to two times a day   Yes [provider]  albuterol (PROVENTIL HFA;VENTOLIN HFA) 108 (90 Base) MCG/ACT inhaler Inhale 2 puffs into the lungs every 4 (four) hours as needed for wheezing or shortness of breath (or coughing). 4/40/10   Delora Fuel, MD  baclofen (LIORESAL) 10 MG tablet Take 0.5-1 tablets (5-10 mg total) by mouth 3 (three) times daily as needed for muscle spasms. Patient not taking: Reported on 04/07/2019 02/08/18   Hilts, Legrand Como, MD  vitamin B-12 (CYANOCOBALAMIN) 50 MCG tablet Take 50 mcg by mouth daily.    [provider]    Family History Family History  Problem Relation Age of Onset  . Diabetes Mother   .  Stroke Mother   . Hypertension Father   . Stroke Father   . Hyperlipidemia Sister   . Hypertension Sister   . Aneurysm Sister   . Hyperlipidemia Brother   . Hypertension Brother     Social History Social History   Tobacco Use  . Smoking status: Current Every Day Smoker    Packs/day: 0.50    Years: 40.00    Pack years: 20.00    Types: Cigarettes  . Smokeless tobacco: Never Used  Substance Use Topics  . Alcohol use: No    Alcohol/week: 0.0 standard drinks  . Drug use: No     Allergies   Keflex [cephalexin], Lipitor [atorvastatin], Codeine, Azor [amlodipine-olmesartan], Lisinopril, and Penicillins   Review of Systems Review of Systems  Constitutional: Negative for fever.  Respiratory: Positive for shortness of breath.   Cardiovascular: Positive for chest pain. Negative for syncope.  Gastrointestinal: Positive for nausea. Negative for vomiting.  All other systems reviewed and are negative.    Physical Exam Updated Vital Signs BP (!) 197/88   Pulse 73   Temp 98.3 F (36.8 C) (Oral)   Resp 18   SpO2 98%   Physical Exam Vitals signs and nursing  note reviewed.  Constitutional:      Appearance: She is well-developed. She is obese.  HENT:     Head: Normocephalic and atraumatic.     Right Ear: Tympanic membrane normal.     Left Ear: Tympanic membrane normal.     Nose: Nose normal.     Mouth/Throat:     Mouth: Mucous membranes are moist.  Eyes:     Extraocular Movements: Extraocular movements intact.     Conjunctiva/sclera: Conjunctivae normal.  Neck:     Musculoskeletal: Neck supple.  Cardiovascular:     Rate and Rhythm: Normal rate and regular rhythm.     Heart sounds: No murmur.  Pulmonary:     Effort: Pulmonary effort is normal. No respiratory distress.     Breath sounds: Normal breath sounds.  Abdominal:     General: There is no distension.     Palpations: Abdomen is soft.     Tenderness: There is no abdominal tenderness. There is no guarding or rebound.     Comments: No abdominal tenderness to palpation  Musculoskeletal:     Comments: 1+ bilateral lower extremity edema, no asymmetry or erythema, no posterior calf tenderness to palpation  Skin:    General: Skin is warm and dry.  Neurological:     General: No focal deficit present.     Mental Status: She is alert and oriented to person, place, and time.     Motor: No weakness.      ED Treatments / Results  Labs (all labs ordered are listed, but only abnormal results are displayed) Labs Reviewed  BASIC METABOLIC PANEL - Abnormal; Notable for the following components:      Result Value   Glucose, Bld 161 (*)    Creatinine, Ser 1.43 (*)    GFR calc non Af Amer 35 (*)    GFR calc Af Amer 40 (*)    All other components within normal limits  CBC - Abnormal; Notable for the following components:   Hemoglobin 11.7 (*)    HCT 35.5 (*)    RDW 16.3 (*)    All other components within normal limits  BRAIN NATRIURETIC PEPTIDE - Abnormal; Notable for the following components:   B Natriuretic Peptide 153.4 (*)    All other components within  normal limits  SARS  CORONAVIRUS 2 (TAT 6-24 HRS)  CBC  COMPREHENSIVE METABOLIC PANEL  CBG MONITORING, ED  TROPONIN I (HIGH SENSITIVITY)  TROPONIN I (HIGH SENSITIVITY)    EKG EKG Interpretation  Date/Time:  Monday April 07 2019 12:55:26 EST Ventricular Rate:  59 PR Interval:  154 QRS Duration: 76 QT Interval:  434 QTC Calculation: 429 R Axis:   10 Text Interpretation: Sinus bradycardia Otherwise normal ECG Confirmed by Virgel Manifold [phone]) on 04/07/2019 2:44:15 PM   Radiology Dg Chest 2 View  Result Date: 04/07/2019 CLINICAL DATA:  Chest pain. EXAM: CHEST - 2 VIEW COMPARISON:  May 28, 2018. FINDINGS: The heart size and mediastinal contours are within normal limits. Both lungs are clear. No pneumothorax or pleural effusion is noted. The visualized skeletal structures are unremarkable. IMPRESSION: No active cardiopulmonary disease. Electronically Signed   By: Marijo Conception M.D.   On: 04/07/2019 13:25    Procedures Procedures (including critical care time)  Medications Ordered in ED Medications  nicotine (NICODERM CQ - dosed in mg/24 hr) patch 7 mg (has no administration in time range)  labetalol (NORMODYNE) injection 10 mg (has no administration in time range)  metoprolol succinate (TOPROL-XL) 24 hr tablet 50 mg (has no administration in time range)  isosorbide mononitrate (IMDUR) 24 hr tablet 60 mg (has no administration in time range)  losartan (COZAAR) tablet 100 mg (has no administration in time range)  clopidogrel (PLAVIX) tablet 75 mg (has no administration in time range)  albuterol (VENTOLIN HFA) 108 (90 Base) MCG/ACT inhaler 2 puff (has no administration in time range)  amLODipine (NORVASC) tablet 5 mg (has no administration in time range)  glimepiride (AMARYL) tablet 2-3 mg (has no administration in time range)  acetaminophen (TYLENOL) tablet 650 mg (has no administration in time range)    Or  acetaminophen (TYLENOL) suppository 650 mg (has no administration in time range)   ondansetron (ZOFRAN) tablet 4 mg (has no administration in time range)    Or  ondansetron (ZOFRAN) injection 4 mg (has no administration in time range)  enoxaparin (LOVENOX) injection 40 mg (has no administration in time range)  aspirin chewable tablet 324 mg (324 mg Oral Given 04/07/19 1540)  hydrALAZINE (APRESOLINE) injection 10 mg (10 mg Intravenous Given 04/07/19 1703)     Initial Impression / Assessment and Plan / ED Course  I have reviewed the triage vital signs and the nursing notes.  Pertinent labs & imaging results that were available during my care of the patient were reviewed by me and considered in my medical decision making (see chart for details).     HEAR Score: 5  [name] is a 78 y.o. female with a past medical history of hypertension (on amlodipine, losartan, Lopressor), diabetes, current smoking presents today for a chest pain episode that lasted about 10 minutes and ringing in her right ear.  Vital signs show hypertension with systolic greater than 947 while in the room.  Patient states that she has not missed any doses of her medication.  Labs and imaging sent for differential diagnosis of CHF, electrolyte abnormality, hypertensive emergency.  324 mg aspirin given on arrival, nitroglycerin held considering no chest pain.  Hydralazine given for high blood pressure.  BNP is elevated at 153, creatinine elevated at 1.4 with a baseline normally around 1.15.  EKG showed no new changes.  Delta troponin less than 5.  On reassessment, patient's blood pressure has not come down much and is still systolic of 096.  After discussion multiple conversations, patient decides that she does not want to leave [place], and consents to an admission for hypertensive emergency.  Labetalol pushes started and medicine team is consulted for admission.  Care of patient was discussed with supervising attending.  Final Clinical Impressions(s) / ED Diagnoses   Final diagnoses:   Hypertensive emergency  Chest pain, unspecified type    ED Discharge Orders    None       Julianne Rice, MD 04/07/19 1942    Carmin Muskrat, MD 04/09/19 [phone]

## 2019-04-07 NOTE — ED Triage Notes (Signed)
Pt here from Urgent Care, sts last night she had a buzzing in her ear then woke up dizzy again this morning, as she has been for one week, so she went to Urgent Care. On the way to urgent care pt had pressure in her L chest with radiation to L arm. Pt takes Plavix and this morning her gums are bleeding. Hx stent placement three years ago.

## 2019-04-07 NOTE — ED Triage Notes (Signed)
Pt presents with left side chest pain that started this morning and radiated down left arm.  Pt has cardiac Hx.

## 2019-04-07 NOTE — ED Notes (Signed)
Patient is being discharged from the Urgent [facility] and sent to the Emergency Department via wheelchair by staff. Per Zipporah Plants, PA, patient is stable but in need of higher level of care due to cardiac complaint. Patient is aware and verbalizes understanding of plan of care.  Vitals:   04/07/19 1217  BP: (!) 217/82  Pulse: 66  Resp: 16  Temp: 98.2 F (36.8 C)  SpO2: 100%

## 2019-04-07 NOTE — ED Notes (Signed)
Patient transported to CT 

## 2019-04-07 NOTE — ED Notes (Signed)
Patient is being discharged from the Urgent [facility] and sent to the Emergency Department via wheelchair by staff. Per provider Debara Pickett, patient is stable but in need of higher level of care due to Active Chest Pain & cardiac Hx. Patient is aware and verbalizes understanding of plan of care.    Vitals:   04/07/19 1217  BP: (!) 217/82  Pulse: 66  Resp: 16  Temp: 98.2 F (36.8 C)  SpO2: 100%

## 2019-04-08 ENCOUNTER — Encounter (HOSPITAL_COMMUNITY): Payer: Self-pay

## 2019-04-08 DIAGNOSIS — I160 Hypertensive urgency: Secondary | ICD-10-CM | POA: Diagnosis not present

## 2019-04-08 LAB — CBC
HCT: 32 % — ABNORMAL LOW (ref 36.0–46.0)
Hemoglobin: 10.4 g/dL — ABNORMAL LOW (ref 12.0–15.0)
MCH: 27.5 pg (ref 26.0–34.0)
MCHC: 32.5 g/dL (ref 30.0–36.0)
MCV: 84.7 fL (ref 80.0–100.0)
Platelets: 179 10*3/uL (ref 150–400)
RBC: 3.78 MIL/uL — ABNORMAL LOW (ref 3.87–5.11)
RDW: 16.5 % — ABNORMAL HIGH (ref 11.5–15.5)
WBC: 8.2 10*3/uL (ref 4.0–10.5)
nRBC: 0 % (ref 0.0–0.2)

## 2019-04-08 LAB — COMPREHENSIVE METABOLIC PANEL
ALT: 9 U/L (ref 0–44)
AST: 12 U/L — ABNORMAL LOW (ref 15–41)
Albumin: 3 g/dL — ABNORMAL LOW (ref 3.5–5.0)
Alkaline Phosphatase: 49 U/L (ref 38–126)
Anion gap: 8 (ref 5–15)
BUN: 17 mg/dL (ref 8–23)
CO2: 21 mmol/L — ABNORMAL LOW (ref 22–32)
Calcium: 9.1 mg/dL (ref 8.9–10.3)
Chloride: 111 mmol/L (ref 98–111)
Creatinine, Ser: 1.38 mg/dL — ABNORMAL HIGH (ref 0.44–1.00)
GFR calc Af Amer: 42 mL/min — ABNORMAL LOW (ref >60)
GFR calc non Af Amer: 36 mL/min — ABNORMAL LOW (ref >60)
Glucose, Bld: 112 mg/dL — ABNORMAL HIGH (ref 70–99)
Potassium: 3.9 mmol/L (ref 3.5–5.1)
Sodium: 140 mmol/L (ref 135–145)
Total Bilirubin: 0.6 mg/dL (ref 0.3–1.2)
Total Protein: 6.6 g/dL (ref 6.5–8.1)

## 2019-04-08 LAB — SARS CORONAVIRUS 2 (TAT 6-24 HRS): SARS Coronavirus 2: NEGATIVE

## 2019-04-08 MED ORDER — PNEUMOCOCCAL VAC POLYVALENT 25 MCG/0.5ML IJ INJ: 0.5000 mL | INJECTION | INTRAMUSCULAR | Status: DC

## 2019-04-08 NOTE — Discharge Summary (Signed)
Physician Discharge Summary  [name] DPO:[id number] DOB: Oct 22, 1940 DOA: 04/07/2019  PCP: [name], MD  Admit date: 04/07/2019 Discharge date: 04/08/2019  Admitted From: Home Disposition:  Home  Discharge Condition:Stable CODE STATUS:FULL Diet recommendation: Heart Healthy  Brief/Interim Summary:  HPI by Dr Alcario Drought: [name] is a 78 y.o. female with medical history significant of CAD s/p stent, CVA, DM2, HTN, PAD. Patient presents to the ED with c/o L sided CP.  5 min short episode earlier today.  Severe, 10/10 pain when it was present, resolved currently.  Radiation to L arm, neck, and mid back. Has ongoing SOB today. Has been having dizziness for past 1 week  Hospital course: Her hospital course remained stable.  Her blood pressure improved this morning.  Patient seen and examined the bedside today.  She feels much better.  She does not have any complaints and she wants to go home.  She denies any dizziness, chest pain, shortness of breath.  I talk to her son on phone and updated the current situation and discharge planning.  He verbalized understanding.  He said that she may not have been taking her blood pressure medicines at home. I have requested the patient and the son to continue her antihypertensives as before and follow-up with her PCP in a week.  Following problems were addressed during hospitalization:  1. HTN Urgency - 1. BP much better today.Cont home BP meds 2. CT head did not show any acute intracranial abnormalities  2. CAD, h/o stroke - 1. Cont plavix  3. DM2 - 1. Cont Amaryl  4. CKD stage 3 - 1. Creat up slightly from 1.2 baseline last year to 1.3 today. 2. Follow up as an outpatient    Discharge Diagnoses:  Principal Problem:   Hypertensive urgency Active Problems:   HLD (hyperlipidemia)   Coronary artery disease involving native coronary artery of native heart with angina pectoris (HCC)   History of stroke   Type 2 diabetes  mellitus with circulatory disorder (HCC)   History of ST elevation myocardial infarction (STEMI)   CKD (chronic kidney disease) stage 3, GFR 30-59 ml/min    Discharge Instructions  Discharge Instructions    Diet - low sodium heart healthy   Complete by: As directed    Discharge instructions   Complete by: As directed    1)Follow up with your PCP in a week. 2) Continue your home medications. 3)Monitor your blood pressure at home.   Increase activity slowly   Complete by: As directed      Allergies as of 04/08/2019      Reactions   Keflex [cephalexin] Shortness Of Breath, Other (See Comments)   Chest pain, headache   Lipitor [atorvastatin] Shortness Of Breath, Other (See Comments)   Severe pain/cramping in legs- "I cannot walk, talk, or breathe"   Codeine Other (See Comments)   Disrupted patient's equilibrium- "Made my world flip upside down"   Azor [amlodipine-olmesartan] Swelling, Palpitations, Rash   Amlodipine alone resumed 12/2013, tolerating   Lisinopril Cough   Penicillins Dermatitis, Other (See Comments)   Did it involve swelling of the face/tongue/throat, SOB, or low BP? No, just worsened eczema Did it involve sudden or severe rash/hives, skin peeling, or any reaction on the inside of your mouth or nose? No Did you need to seek medical attention at a hospital or doctor's office? No When did it last happen? "More than 10 years ago" If all above answers are "NO", may proceed with cephalosporin use.  Medication List    TAKE these medications   acetaminophen 650 MG CR tablet Commonly known as: TYLENOL Take 650 mg by mouth daily.   albuterol 108 (90 Base) MCG/ACT inhaler Commonly known as: VENTOLIN HFA Inhale 2 puffs into the lungs every 4 (four) hours as needed for wheezing or shortness of breath (or coughing).   amLODipine 5 MG tablet Commonly known as: NORVASC Take 5 mg by mouth 2 (two) times daily.   baclofen 10 MG tablet Commonly known as:  LIORESAL Take 0.5-1 tablets (5-10 mg total) by mouth 3 (three) times daily as needed for muscle spasms.   clopidogrel 75 MG tablet Commonly known as: PLAVIX Take 75 mg by mouth daily.   glimepiride 2 MG tablet Commonly known as: AMARYL Take 2-3 mg by mouth 2 (two) times daily. Take 3 mg by mouth in the morning and 2 mg at bedtime   isosorbide mononitrate 60 MG 24 hr tablet Commonly known as: IMDUR Take 60 mg by mouth daily.   losartan 100 MG tablet Commonly known as: COZAAR Take 100 mg by mouth daily.   metoprolol succinate 50 MG 24 hr tablet Commonly known as: TOPROL-XL Take 50 mg by mouth daily.   nitroGLYCERIN 0.4 MG SL tablet Commonly known as: NITROSTAT Place 0.4 mg under the tongue every 5 (five) minutes as needed for chest pain.   NON FORMULARY Place 1-2 drops into both eyes See admin instructions. [employer] Lomb Advanced Eye Relief Dry Eye Lubricant Eye Drops: Instill 1-2 drops into each eye one to two times a day   vitamin B-12 50 MCG tablet Commonly known as: CYANOCOBALAMIN Take 50 mcg by mouth daily.      Follow-up Information    [name], MD. Schedule an appointment as soon as possible for a visit in 1 week(s).   Specialty: Family Medicine Contact information: [address] [place] [address] [phone]          Allergies  Allergen Reactions  . Keflex [Cephalexin] Shortness Of Breath and Other (See Comments)    Chest pain, headache  . Lipitor [Atorvastatin] Shortness Of Breath and Other (See Comments)    Severe pain/cramping in legs- "I cannot walk, talk, or breathe"  . Codeine Other (See Comments)    Disrupted patient's equilibrium- "Made my world flip upside down"  . Azor [Amlodipine-Olmesartan] Swelling, Palpitations and Rash    Amlodipine alone resumed 12/2013, tolerating  . Lisinopril Cough  . Penicillins Dermatitis and Other (See Comments)    Did it involve swelling of the face/tongue/throat, SOB, or low BP?  No, just worsened eczema Did it involve sudden or severe rash/hives, skin peeling, or any reaction on the inside of your mouth or nose? No Did you need to seek medical attention at a hospital or doctor's office? No When did it last happen? "More than 10 years ago" If all above answers are "NO", may proceed with cephalosporin use.     Consultations:  None   Procedures/Studies: Dg Chest 2 View  Result Date: 04/07/2019 CLINICAL DATA:  Chest pain. EXAM: CHEST - 2 VIEW COMPARISON:  May 28, 2018. FINDINGS: The heart size and mediastinal contours are within normal limits. Both lungs are clear. No pneumothorax or pleural effusion is noted. The visualized skeletal structures are unremarkable. IMPRESSION: No active cardiopulmonary disease. Electronically Signed   By: Marijo Conception M.D.   On: 04/07/2019 13:25   Ct Head Wo Contrast  Result Date: 04/07/2019 CLINICAL DATA:  Ataxia dizzy  EXAM: CT HEAD WITHOUT CONTRAST TECHNIQUE: Contiguous axial images were obtained from the base of the skull through the vertex without intravenous contrast. COMPARISON:  MRI 02/18/2015, CT brain 03/18/2014 FINDINGS: Brain: Chronic infarct involving right white matter. Progressed hypodensity within the right frontal white matter, likely progression of small vessel ischemic change. No focal mass or hemorrhage. Moderate small vessel ischemic changes of the white matter. Mild ex vacuo dilatation of the right lateral ventricle. Chronic appearing lacunar infarct within the right basal ganglia. Vascular: No hyperdense vessel.  Carotid vascular calcification Skull: Normal. Negative for fracture or focal lesion. Sinuses/Orbits: No acute finding. Other: None IMPRESSION: 1. No definite CT evidence for acute intracranial abnormality. 2. Atrophy and small vessel ischemic changes of the white matter. Chronic infarct within the right white matter and basal ganglia. Increased white matter hypodensity slightly asymmetric to the left  presumably due to progression of small vessel ischemic change. Electronically Signed   By: Donavan Foil M.D.   On: 04/07/2019 21:03       Subjective:  Patient seen and examined the bedside this morning.  Hemodynamically  stable for discharge.  Discharge Exam: Vitals:   04/08/19 0340 04/08/19 0426  BP: (!) 199/75 (!) 155/54  Pulse:    Resp:    Temp:    SpO2:     Vitals:   04/08/19 0005 04/08/19 0338 04/08/19 0340 04/08/19 0426  BP: (!) 185/87 (!) 204/71 (!) 199/75 (!) 155/54  Pulse:      Resp: 16 18    Temp: 97.6 F (36.4 C) 98.2 F (36.8 C)    TempSrc: Oral Oral    SpO2: 99% 100%    Weight: 80.7 kg     Height: 5\' 7"  (1.702 m)       General: Pt is alert, awake, not in acute distress Cardiovascular: RRR, S1/S2 +, no rubs, no gallops Respiratory: CTA bilaterally, no wheezing, no rhonchi Abdominal: Soft, NT, ND, bowel sounds + Extremities: no edema, no cyanosis    The results of significant diagnostics from this hospitalization (including imaging, microbiology, ancillary and laboratory) are listed below for reference.     Microbiology: Recent Results (from the past 240 hour(s))  SARS CORONAVIRUS 2 (TAT 6-24 HRS) Nasopharyngeal Nasopharyngeal Swab     Status: None   Collection Time: 04/07/19  7:01 PM   Specimen: Nasopharyngeal Swab  Result Value Ref Range Status   SARS Coronavirus 2 NEGATIVE NEGATIVE Final    Comment: (NOTE) SARS-CoV-2 target nucleic acids are NOT DETECTED. The SARS-CoV-2 RNA is generally detectable in upper and lower respiratory specimens during the acute phase of infection. Negative results do not preclude SARS-CoV-2 infection, do not rule out co-infections with other pathogens, and should not be used as the sole basis for treatment or other patient management decisions. Negative results must be combined with clinical observations, patient history, and epidemiological information. The expected result is Negative. Fact Sheet for  Patients: SugarRoll.be Fact Sheet for Healthcare Providers: https://www.woods-mathews.com/ This test is not yet approved or cleared by the [place] FDA and  has been authorized for detection and/or diagnosis of SARS-CoV-2 by FDA under an Emergency Use Authorization (EUA). This EUA will remain  in effect (meaning this test can be used) for the duration of the COVID-19 declaration under Section 56 4(b)(1) of the Act, 21 U.S.C. section 360bbb-3(b)(1), unless the authorization is terminated or revoked sooner. Performed at [facility], [place] [address], [place], [place] [address]      Labs: BNP (last 3  results) Recent Labs    04/07/19 1513  BNP 009.3*   Basic Metabolic Panel: Recent Labs  Lab 04/07/19 1254 04/08/19 0325  NA 140 140  K 4.2 3.9  CL 110 111  CO2 22 21*  GLUCOSE 161* 112*  BUN 17 17  CREATININE 1.43* 1.38*  CALCIUM 9.1 9.1   Liver Function Tests: Recent Labs  Lab 04/08/19 0325  AST 12*  ALT 9  ALKPHOS 49  BILITOT 0.6  PROT 6.6  ALBUMIN 3.0*   No results for input(s): LIPASE, AMYLASE in the last 168 hours. No results for input(s): AMMONIA in the last 168 hours. CBC: Recent Labs  Lab 04/07/19 1254 04/08/19 0325  WBC 6.8 8.2  HGB 11.7* 10.4*  HCT 35.5* 32.0*  MCV 85.7 84.7  PLT 194 179   Cardiac Enzymes: No results for input(s): CKTOTAL, CKMB, CKMBINDEX, TROPONINI in the last 168 hours. BNP: Invalid input(s): POCBNP CBG: Recent Labs  Lab 04/07/19 1922  GLUCAP 90   D-Dimer No results for input(s): DDIMER in the last 72 hours. Hgb A1c No results for input(s): HGBA1C in the last 72 hours. Lipid Profile No results for input(s): CHOL, HDL, LDLCALC, TRIG, CHOLHDL, LDLDIRECT in the last 72 hours. Thyroid function studies No results for input(s): TSH, T4TOTAL, T3FREE, THYROIDAB in the last 72 hours.  Invalid input(s): FREET3 Anemia work up No results for input(s): VITAMINB12, FOLATE,  FERRITIN, TIBC, IRON, RETICCTPCT in the last 72 hours. Urinalysis    Component Value Date/Time   COLORURINE RED (A) 09/03/2015 2312   APPEARANCEUR TURBID (A) 09/03/2015 2312   LABSPEC 1.011 09/03/2015 2312   PHURINE 5.5 09/03/2015 2312   GLUCOSEU NEGATIVE 09/03/2015 2312   HGBUR LARGE (A) 09/03/2015 2312   BILIRUBINUR NEGATIVE 09/03/2015 2312   KETONESUR 15 (A) 09/03/2015 2312   PROTEINUR 100 (A) 09/03/2015 2312   UROBILINOGEN 0.2 07/04/2014 0319   NITRITE NEGATIVE 09/03/2015 2312   LEUKOCYTESUR LARGE (A) 09/03/2015 2312   Sepsis Labs Invalid input(s): PROCALCITONIN,  WBC,  LACTICIDVEN Microbiology Recent Results (from the past 240 hour(s))  SARS CORONAVIRUS 2 (TAT 6-24 HRS) Nasopharyngeal Nasopharyngeal Swab     Status: None   Collection Time: 04/07/19  7:01 PM   Specimen: Nasopharyngeal Swab  Result Value Ref Range Status   SARS Coronavirus 2 NEGATIVE NEGATIVE Final    Comment: (NOTE) SARS-CoV-2 target nucleic acids are NOT DETECTED. The SARS-CoV-2 RNA is generally detectable in upper and lower respiratory specimens during the acute phase of infection. Negative results do not preclude SARS-CoV-2 infection, do not rule out co-infections with other pathogens, and should not be used as the sole basis for treatment or other patient management decisions. Negative results must be combined with clinical observations, patient history, and epidemiological information. The expected result is Negative. Fact Sheet for Patients: SugarRoll.be Fact Sheet for Healthcare Providers: https://www.woods-mathews.com/ This test is not yet approved or cleared by the [place] FDA and  has been authorized for detection and/or diagnosis of SARS-CoV-2 by FDA under an Emergency Use Authorization (EUA). This EUA will remain  in effect (meaning this test can be used) for the duration of the COVID-19 declaration under Section 56 4(b)(1) of the Act, 21  U.S.C. section 360bbb-3(b)(1), unless the authorization is terminated or revoked sooner. Performed at [facility], [place] [address] [place], [place] [address]     Please note: You were cared for by a hospitalist during your hospital stay. Once you are discharged, your primary care physician will handle any further  medical issues. Please note that NO REFILLS for any discharge medications will be authorized once you are discharged, as it is imperative that you return to your primary care physician (or establish a relationship with a primary care physician if you do not have one) for your post hospital discharge needs so that they can reassess your need for medications and monitor your lab values.    Time coordinating discharge: 40 minutes  SIGNED:   Shelly Coss, MD  [facility] 04/08/2019, 11:21 AM Pager [phone]  If 7PM-7AM, please contact night-coverage [email or web address] Password [email or web address]

## 2019-04-10 DIAGNOSIS — F331 Major depressive disorder, recurrent, moderate: Secondary | ICD-10-CM | POA: Diagnosis not present

## 2019-04-10 DIAGNOSIS — F17200 Nicotine dependence, unspecified, uncomplicated: Secondary | ICD-10-CM | POA: Diagnosis not present

## 2019-04-10 DIAGNOSIS — I701 Atherosclerosis of renal artery: Secondary | ICD-10-CM | POA: Diagnosis not present

## 2019-04-10 DIAGNOSIS — E1121 Type 2 diabetes mellitus with diabetic nephropathy: Secondary | ICD-10-CM | POA: Diagnosis not present

## 2019-04-10 DIAGNOSIS — I10 Essential (primary) hypertension: Secondary | ICD-10-CM | POA: Diagnosis not present

## 2019-04-10 DIAGNOSIS — N182 Chronic kidney disease, stage 2 (mild): Secondary | ICD-10-CM | POA: Diagnosis not present

## 2019-04-30 DIAGNOSIS — F17290 Nicotine dependence, other tobacco product, uncomplicated: Secondary | ICD-10-CM | POA: Diagnosis not present

## 2019-04-30 DIAGNOSIS — I714 Abdominal aortic aneurysm, without rupture: Secondary | ICD-10-CM | POA: Diagnosis not present

## 2019-04-30 DIAGNOSIS — I25110 Atherosclerotic heart disease of native coronary artery with unstable angina pectoris: Secondary | ICD-10-CM | POA: Diagnosis not present

## 2019-04-30 DIAGNOSIS — I639 Cerebral infarction, unspecified: Secondary | ICD-10-CM | POA: Diagnosis not present

## 2019-04-30 DIAGNOSIS — E1151 Type 2 diabetes mellitus with diabetic peripheral angiopathy without gangrene: Secondary | ICD-10-CM | POA: Diagnosis not present

## 2019-04-30 DIAGNOSIS — E785 Hyperlipidemia, unspecified: Secondary | ICD-10-CM | POA: Diagnosis not present

## 2019-04-30 DIAGNOSIS — I10 Essential (primary) hypertension: Secondary | ICD-10-CM | POA: Diagnosis not present

## 2019-05-16 ENCOUNTER — Emergency Department (HOSPITAL_COMMUNITY)
Admission: EM | Admit: 2019-05-16 | Discharge: 2019-05-16 | Disposition: A | Discharge status: Home / Self Care | Payer: Medicare HMO | Attending: Emergency Medicine | Admitting: Emergency Medicine

## 2019-05-16 ENCOUNTER — Other Ambulatory Visit: Payer: Self-pay

## 2019-05-16 ENCOUNTER — Encounter (HOSPITAL_COMMUNITY): Payer: Self-pay | Admitting: Emergency Medicine

## 2019-05-16 DIAGNOSIS — I10 Essential (primary) hypertension: Secondary | ICD-10-CM | POA: Diagnosis not present

## 2019-05-16 DIAGNOSIS — M549 Dorsalgia, unspecified: Secondary | ICD-10-CM | POA: Insufficient documentation

## 2019-05-16 DIAGNOSIS — M5489 Other dorsalgia: Secondary | ICD-10-CM | POA: Diagnosis not present

## 2019-05-16 DIAGNOSIS — Z5321 Procedure and treatment not carried out due to patient leaving prior to being seen by health care provider: Secondary | ICD-10-CM | POA: Insufficient documentation

## 2019-05-16 DIAGNOSIS — R52 Pain, unspecified: Secondary | ICD-10-CM | POA: Diagnosis not present

## 2019-05-16 LAB — CBC
HCT: 34.7 % — ABNORMAL LOW (ref 36.0–46.0)
Hemoglobin: 11.2 g/dL — ABNORMAL LOW (ref 12.0–15.0)
MCH: 27.7 pg (ref 26.0–34.0)
MCHC: 32.3 g/dL (ref 30.0–36.0)
MCV: 85.9 fL (ref 80.0–100.0)
Platelets: 179 10*3/uL (ref 150–400)
RBC: 4.04 MIL/uL (ref 3.87–5.11)
RDW: 16.2 % — ABNORMAL HIGH (ref 11.5–15.5)
WBC: 7.9 10*3/uL (ref 4.0–10.5)
nRBC: 0 % (ref 0.0–0.2)

## 2019-05-16 LAB — BASIC METABOLIC PANEL
Anion gap: 9 (ref 5–15)
BUN: 21 mg/dL (ref 8–23)
CO2: 22 mmol/L (ref 22–32)
Calcium: 9.4 mg/dL (ref 8.9–10.3)
Chloride: 109 mmol/L (ref 98–111)
Creatinine, Ser: 1.63 mg/dL — ABNORMAL HIGH (ref 0.44–1.00)
GFR calc Af Amer: 34 mL/min — ABNORMAL LOW (ref >60)
GFR calc non Af Amer: 30 mL/min — ABNORMAL LOW (ref >60)
Glucose, Bld: 151 mg/dL — ABNORMAL HIGH (ref 70–99)
Potassium: 4.3 mmol/L (ref 3.5–5.1)
Sodium: 140 mmol/L (ref 135–145)

## 2019-05-16 NOTE — ED Notes (Signed)
Truddie Coco (Daughter#[phone]) called about the status of her mother. Would like a call back.

## 2019-05-16 NOTE — ED Notes (Signed)
No answer x 2 for VS

## 2019-05-16 NOTE — ED Notes (Signed)
Called Pt for vitals no answer.

## 2019-05-16 NOTE — ED Notes (Signed)
Called for vitals X2 no answer.

## 2019-05-16 NOTE — ED Notes (Signed)
Called for vitals no answer X3.

## 2019-05-16 NOTE — ED Triage Notes (Addendum)
Patient arrives via [facility] from home for c/o L flank pain that radiates into her abdomen, pt was in the hospital recently and diagnosed with worsening renal function. Pt reports pain getting worse and is now in her hips, EMS VSS other than bp that was 204/90. Pt a/ox4

## 2019-05-22 DIAGNOSIS — R109 Unspecified abdominal pain: Secondary | ICD-10-CM | POA: Diagnosis not present

## 2019-05-22 DIAGNOSIS — I10 Essential (primary) hypertension: Secondary | ICD-10-CM | POA: Diagnosis not present

## 2019-06-11 DIAGNOSIS — I10 Essential (primary) hypertension: Secondary | ICD-10-CM | POA: Diagnosis not present

## 2019-06-11 DIAGNOSIS — F331 Major depressive disorder, recurrent, moderate: Secondary | ICD-10-CM | POA: Diagnosis not present

## 2019-06-11 DIAGNOSIS — N1832 Chronic kidney disease, stage 3b: Secondary | ICD-10-CM | POA: Diagnosis not present

## 2019-06-11 DIAGNOSIS — I701 Atherosclerosis of renal artery: Secondary | ICD-10-CM | POA: Diagnosis not present

## 2019-06-17 ENCOUNTER — Telehealth (HOSPITAL_COMMUNITY): Payer: Self-pay

## 2019-06-17 ENCOUNTER — Other Ambulatory Visit: Payer: Self-pay

## 2019-06-17 DIAGNOSIS — I6529 Occlusion and stenosis of unspecified carotid artery: Secondary | ICD-10-CM

## 2019-06-17 DIAGNOSIS — I739 Peripheral vascular disease, unspecified: Secondary | ICD-10-CM

## 2019-06-17 DIAGNOSIS — I70212 Atherosclerosis of native arteries of extremities with intermittent claudication, left leg: Secondary | ICD-10-CM

## 2019-06-17 DIAGNOSIS — I701 Atherosclerosis of renal artery: Secondary | ICD-10-CM

## 2019-06-17 NOTE — Telephone Encounter (Signed)

## 2019-06-18 ENCOUNTER — Encounter (HOSPITAL_COMMUNITY): Payer: Medicare HMO

## 2019-06-18 ENCOUNTER — Other Ambulatory Visit (HOSPITAL_COMMUNITY): Payer: Medicare HMO

## 2019-06-19 ENCOUNTER — Other Ambulatory Visit: Payer: Self-pay

## 2019-06-19 ENCOUNTER — Ambulatory Visit: Payer: Medicare HMO | Admitting: Vascular Surgery

## 2019-06-19 ENCOUNTER — Ambulatory Visit (INDEPENDENT_AMBULATORY_CARE_PROVIDER_SITE_OTHER)
Admission: RE | Admit: 2019-06-19 | Discharge: 2019-06-19 | Disposition: A | Discharge status: Home / Self Care | Payer: Medicare HMO | Source: Ambulatory Visit | Attending: Vascular Surgery | Admitting: Vascular Surgery

## 2019-06-19 ENCOUNTER — Ambulatory Visit (HOSPITAL_COMMUNITY)
Admission: RE | Admit: 2019-06-19 | Discharge: 2019-06-19 | Disposition: A | Discharge status: Home / Self Care | Payer: Medicare HMO | Source: Ambulatory Visit | Attending: Vascular Surgery | Admitting: Vascular Surgery

## 2019-06-19 ENCOUNTER — Encounter: Payer: Self-pay | Admitting: Vascular Surgery

## 2019-06-19 ENCOUNTER — Encounter (HOSPITAL_COMMUNITY): Payer: Medicare HMO

## 2019-06-19 ENCOUNTER — Ambulatory Visit (INDEPENDENT_AMBULATORY_CARE_PROVIDER_SITE_OTHER): Payer: Medicare HMO | Admitting: Vascular Surgery

## 2019-06-19 VITALS — BP 183/80 | HR 62 | Temp 97.2°F | Resp 20 | Ht 67.0 in | Wt 178.0 lb

## 2019-06-19 DIAGNOSIS — I70212 Atherosclerosis of native arteries of extremities with intermittent claudication, left leg: Secondary | ICD-10-CM | POA: Diagnosis not present

## 2019-06-19 DIAGNOSIS — I701 Atherosclerosis of renal artery: Secondary | ICD-10-CM | POA: Diagnosis not present

## 2019-06-19 DIAGNOSIS — I739 Peripheral vascular disease, unspecified: Secondary | ICD-10-CM | POA: Insufficient documentation

## 2019-06-19 DIAGNOSIS — I6529 Occlusion and stenosis of unspecified carotid artery: Secondary | ICD-10-CM | POA: Insufficient documentation

## 2019-06-19 NOTE — H&P (View-Only) (Signed)
Referring Physician: Dr. [name]  Patient name: [name] MRN: [id number] DOB: [birth year] Sex: female  REASON FOR CONSULT: Renal artery stenosis  HPI: [name] is a 79 y.o. female, well-known to our practice.  She is referred today for evaluation of possible renal artery stenosis as an etiology for poorly controlled blood pressure.  She is currently requiring 4 medications.  She states that her blood pressure has not improved with this.  She was recently in the emergency room for elevation of her blood pressure.  She has not had any decline in her renal function that she knows of.  She currently is on Norvasc losartan Aldactone and Toprol.  She is also on Lipitor Plavix and Brilinta.  She previously had an infrarenal abdominal aortic aneurysm repair by Dr. Amedeo Plenty in 2007.  I did a left femoral to below-knee popliteal bypass on her in 2016.  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  Other medical problems include   Past Medical History:  Diagnosis Date  . AAA (abdominal aortic aneurysm) ([place])   . Aneurysm ([place])   . Arthritis    osteoarthritis  . Constipation   . Diabetes mellitus    Type 2, on Glimiperide  . GERD (gastroesophageal reflux disease)   . Glaucoma   . Hyperlipidemia   . Hypertension   . Kidney stones   . Myocardial infarction ([place])   . Peripheral vascular disease ([place])   . Pneumonia   . Stroke ([place]) 01/2015   with left side weakness   . TIA (transient ischemic attack)   . Urinary incontinence    Past Surgical History:  Procedure Laterality Date  . ABDOMINAL AORTAGRAM N/A 03/13/2014   Procedure: ABDOMINAL Maxcine Ham;  Surgeon: Elam Dutch, MD;  Location: [facility] CATH LAB;  Service: Cardiovascular;  Laterality: N/A;  . ABDOMINAL AORTIC ANEURYSM REPAIR    . ABDOMINAL AORTIC ANEURYSM REPAIR  ?2012  . ABDOMINAL HYSTERECTOMY     partial  . BACK SURGERY    . CARDIAC  CATHETERIZATION    . CORONARY ANGIOPLASTY  03/2015  . EYE SURGERY Right    laser surgery for blood behind eye, loss of sight  . FEMORAL-POPLITEAL BYPASS GRAFT Left 07/06/2014   Procedure: BYPASS GRAFT FEMORAL-POPLITEAL ARTERY;  Surgeon: Elam Dutch, MD;  Location: [facility];  Service: Vascular;  Laterality: Left;  . HERNIA REPAIR    . JOINT REPLACEMENT    . LEFT HEART CATHETERIZATION WITH CORONARY ANGIOGRAM N/A 04/12/2014   Procedure: LEFT HEART CATHETERIZATION WITH CORONARY ANGIOGRAM;  Surgeon: Clent Demark, MD;  Location: [facility] CATH LAB;  Service: Cardiovascular;  Laterality: N/A;  . PERCUTANEOUS CORONARY STENT INTERVENTION (PCI-S)  04/12/2014   Procedure: PERCUTANEOUS CORONARY STENT INTERVENTION (PCI-S);  Surgeon: Clent Demark, MD;  Location: [facility] CATH LAB;  Service: Cardiovascular;;  prox and mid RCA  . TOTAL HIP ARTHROPLASTY Left 05/18/2015  . TOTAL HIP ARTHROPLASTY Left 05/18/2015   Procedure: LEFT TOTAL HIP ARTHROPLASTY ANTERIOR APPROACH;  Surgeon: Mcarthur Rossetti, MD;  Location: [facility];  Service: Orthopedics;  Laterality: Left;  . TOTAL KNEE ARTHROPLASTY Left ~ 2003  . TUBAL LIGATION    . uterine tumor    . VENTRAL HERNIA REPAIR      Family History  Problem Relation Age of Onset  . Diabetes Mother   . Stroke Mother   . Hypertension Father   . Stroke Father   .  Hyperlipidemia Sister   . Hypertension Sister   . Aneurysm Sister   . Hyperlipidemia Brother   . Hypertension Brother     SOCIAL HISTORY: Social History   Socioeconomic History  . Marital status: Widowed    Spouse name: Not on file  . Number of children: Not on file  . Years of education: Not on file  . Highest education level: Not on file  Occupational History  . Not on file  Tobacco Use  . Smoking status: Current Every Day Smoker    Packs/day: 0.50    Years: 40.00    Pack years: 20.00    Types: Cigarettes  . Smokeless tobacco: Never Used  Substance and Sexual Activity  . Alcohol use: No     Alcohol/week: 0.0 standard drinks  . Drug use: No  . Sexual activity: Not on file  Other Topics Concern  . Not on file  Social History Narrative  . Not on file   Social Determinants of Health   Financial Resource Strain:   . Difficulty of Paying Living Expenses: Not on file  Food Insecurity:   . Worried About Charity fundraiser in the Last Year: Not on file  . Ran Out of Food in the Last Year: Not on file  Transportation Needs:   . Lack of Transportation (Medical): Not on file  . Lack of Transportation (Non-Medical): Not on file  Physical Activity:   . Days of Exercise per Week: Not on file  . Minutes of Exercise per Session: Not on file  Stress:   . Feeling of Stress : Not on file  Social Connections:   . Frequency of Communication with Friends and Family: Not on file  . Frequency of Social Gatherings with Friends and Family: Not on file  . Attends Religious Services: Not on file  . Active Member of Clubs or Organizations: Not on file  . Attends Archivist Meetings: Not on file  . Marital Status: Not on file  Intimate Partner Violence:   . Fear of Current or Ex-Partner: Not on file  . Emotionally Abused: Not on file  . Physically Abused: Not on file  . Sexually Abused: Not on file    Allergies  Allergen Reactions  . Keflex [Cephalexin] Shortness Of Breath and Other (See Comments)    Chest pain, headache  . Lipitor [Atorvastatin] Shortness Of Breath and Other (See Comments)    Severe pain/cramping in legs- "I cannot walk, talk, or breathe"  . Codeine Other (See Comments)    Disrupted patient's equilibrium- "Made my world flip upside down"  . Hydrochlorothiazide     Other reaction(s): lightheaded, nausea  . Hydrocodone-Acetaminophen     Other reaction(s): "get crazy"  . Metformin Hcl     Other reaction(s): upset stomach  . Tramadol Hcl     Other reaction(s): "get crazy"  . Azor [Amlodipine-Olmesartan] Swelling, Palpitations and Rash    Amlodipine alone  resumed 12/2013, tolerating  . Lisinopril Cough  . Penicillins Dermatitis and Other (See Comments)    Did it involve swelling of the face/tongue/throat, SOB, or low BP? No, just worsened eczema Did it involve sudden or severe rash/hives, skin peeling, or any reaction on the inside of your mouth or nose? No Did you need to seek medical attention at a hospital or doctor's office? No When did it last happen? "More than 10 years ago" If all above answers are "NO", may proceed with cephalosporin use.     Current Outpatient Medications  Medication Sig Dispense Refill  . acetaminophen (TYLENOL) 650 MG CR tablet Take 650 mg by mouth daily.    Marland Kitchen albuterol (PROVENTIL HFA;VENTOLIN HFA) 108 (90 Base) MCG/ACT inhaler Inhale 2 puffs into the lungs every 4 (four) hours as needed for wheezing or shortness of breath (or coughing). 1 Inhaler 0  . amLODipine (NORVASC) 10 MG tablet     . atorvastatin (LIPITOR) 40 MG tablet     . clopidogrel (PLAVIX) 75 MG tablet Take 75 mg by mouth daily.    Marland Kitchen esomeprazole (NEXIUM) 40 MG capsule Oral once a day    . glimepiride (AMARYL) 2 MG tablet Take 2-3 mg by mouth 2 (two) times daily. Take 3 mg by mouth in the morning and 2 mg at bedtime    . isosorbide mononitrate (IMDUR) 60 MG 24 hr tablet Take 60 mg by mouth daily.  3  . losartan (COZAAR) 100 MG tablet Take 100 mg by mouth daily.    Marland Kitchen losartan (COZAAR) 50 MG tablet Take 50 mg by mouth daily.    . metoprolol (TOPROL-XL) 50 MG 24 hr tablet Take 50 mg by mouth daily.     . nitroGLYCERIN (NITROSTAT) 0.4 MG SL tablet Place 0.4 mg under the tongue every 5 (five) minutes as needed for chest pain.    . NON FORMULARY Place 1-2 drops into both eyes See admin instructions. Bausch + Lomb Advanced Eye Relief Dry Eye Lubricant Eye Drops: Instill 1-2 drops into each eye one to two times a day    . spironolactone (ALDACTONE) 25 MG tablet Take 25 mg by mouth daily.    . ticagrelor (BRILINTA) 90 MG TABS tablet Orally Twice a day    .  vitamin B-12 (CYANOCOBALAMIN) 50 MCG tablet Take 50 mcg by mouth daily.    . baclofen (LIORESAL) 10 MG tablet Take 0.5-1 tablets (5-10 mg total) by mouth 3 (three) times daily as needed for muscle spasms. (Patient not taking: Reported on 04/07/2019) 30 each 3   No current facility-administered medications for this visit.    ROS:   General:  No weight loss, Fever, chills  HEENT: No recent headaches, no nasal bleeding, no visual changes, no sore throat  Neurologic: No dizziness, blackouts, seizures. No recent symptoms of stroke or mini- stroke. No recent episodes of slurred speech, or temporary blindness.  Cardiac: No recent episodes of chest pain/pressure, no shortness of breath at rest.  + shortness of breath with exertion.  Denies history of atrial fibrillation or irregular heartbeat  Vascular: No history of rest pain in feet.  No history of claudication.  No history of non-healing ulcer, No history of DVT   Pulmonary: No home oxygen, no productive cough, no hemoptysis,  No asthma or wheezing  Musculoskeletal:  [X]  Arthritis, [ ]  Low back pain,  [X]  Joint pain  Hematologic:No history of hypercoagulable state.  No history of easy bleeding.  No history of anemia  Gastrointestinal: No hematochezia or melena,  No gastroesophageal reflux, no trouble swallowing  Urinary: [ ]  chronic Kidney disease, [ ]  on HD - [ ]  MWF or [ ]  TTHS, [ ]  Burning with urination, [ ]  Frequent urination, [ ]  Difficulty urinating;   Skin: No rashes  Psychological: No history of anxiety,  No history of depression   Physical Examination  Vitals:   06/19/19 1107  BP: (!) 183/80  Pulse: 62  Resp: 20  Temp: (!) 97.2 F (36.2 C)  SpO2: 98%  Weight: 178 lb (80.7 kg)  Height:  5\' 7"  (1.702 m)    Body mass index is 27.88 kg/m.  General:  Alert and oriented, no acute distress HEENT: Normal Neck: No JVD Cardiac: Regular Rate and Rhythm  Abdomen: Soft, non-tender, non-distended, no mass Skin: No  rash Extremity Pulses:  2+ radial, brachial, femoral, absent dorsalis pedis, posterior tibial pulses bilaterally Musculoskeletal: No deformity or edema  Neurologic: Upper and lower extremity motor 5/5 and symmetric  DATA:  Patient had a carotid duplex exam today this showed no significant carotid stenosis.  She also had bilateral ABIs performed today which were 0.7 on the left 0.5 on the right.  She also had an arterial duplex exam of her left leg bypass graft which showed that this was patent.  She also had a renal duplex exam which suggested a greater than 60% right renal artery stenosis with velocities of 254 cm/s.  ASSESSMENT: Poorly controlled blood pressure requiring 4 medications at this point with systolic pressures still in the 180s possible renal artery stenosis   PLAN: Renal angiogram possible intervention scheduled for June 25, 2019.  Risk benefits possible complications of procedure details including not limited to bleeding infection vessel injury were discussed with the patient and her daughter.  They agree to proceed.   Ruta Hinds, MD [facility] Office: [phone] Pager: [phone]

## 2019-06-19 NOTE — Progress Notes (Signed)
Referring Physician: Dr. [name]  Patient name: [name] MRN: [id number] DOB: 1940/06/24 Sex: female  REASON FOR CONSULT: Renal artery stenosis  HPI: [name] is a 79 y.o. female, well-known to our practice.  She is referred today for evaluation of possible renal artery stenosis as an etiology for poorly controlled blood pressure.  She is currently requiring 4 medications.  She states that her blood pressure has not improved with this.  She was recently in the emergency room for elevation of her blood pressure.  She has not had any decline in her renal function that she knows of.  She currently is on Norvasc losartan Aldactone and Toprol.  She is also on Lipitor Plavix and Brilinta.  She previously had an infrarenal abdominal aortic aneurysm repair by Dr. Amedeo Plenty in 2007.  I did a left femoral to below-knee popliteal bypass on her in 2016.  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  Other medical problems include   Past Medical History:  Diagnosis Date  . AAA (abdominal aortic aneurysm) ([place])   . Aneurysm ([place])   . Arthritis    osteoarthritis  . Constipation   . Diabetes mellitus    Type 2, on Glimiperide  . GERD (gastroesophageal reflux disease)   . Glaucoma   . Hyperlipidemia   . Hypertension   . Kidney stones   . Myocardial infarction ([place])   . Peripheral vascular disease ([place])   . Pneumonia   . Stroke ([place]) 01/2015   with left side weakness   . TIA (transient ischemic attack)   . Urinary incontinence    Past Surgical History:  Procedure Laterality Date  . ABDOMINAL AORTAGRAM N/A 03/13/2014   Procedure: ABDOMINAL Maxcine Ham;  Surgeon: Elam Dutch, MD;  Location: [facility] CATH LAB;  Service: Cardiovascular;  Laterality: N/A;  . ABDOMINAL AORTIC ANEURYSM REPAIR    . ABDOMINAL AORTIC ANEURYSM REPAIR  ?2012  . ABDOMINAL HYSTERECTOMY     partial  . BACK SURGERY    . CARDIAC  CATHETERIZATION    . CORONARY ANGIOPLASTY  03/2015  . EYE SURGERY Right    laser surgery for blood behind eye, loss of sight  . FEMORAL-POPLITEAL BYPASS GRAFT Left 07/06/2014   Procedure: BYPASS GRAFT FEMORAL-POPLITEAL ARTERY;  Surgeon: Elam Dutch, MD;  Location: [facility];  Service: Vascular;  Laterality: Left;  . HERNIA REPAIR    . JOINT REPLACEMENT    . LEFT HEART CATHETERIZATION WITH CORONARY ANGIOGRAM N/A 04/12/2014   Procedure: LEFT HEART CATHETERIZATION WITH CORONARY ANGIOGRAM;  Surgeon: Clent Demark, MD;  Location: [facility] CATH LAB;  Service: Cardiovascular;  Laterality: N/A;  . PERCUTANEOUS CORONARY STENT INTERVENTION (PCI-S)  04/12/2014   Procedure: PERCUTANEOUS CORONARY STENT INTERVENTION (PCI-S);  Surgeon: Clent Demark, MD;  Location: [facility] CATH LAB;  Service: Cardiovascular;;  prox and mid RCA  . TOTAL HIP ARTHROPLASTY Left 05/18/2015  . TOTAL HIP ARTHROPLASTY Left 05/18/2015   Procedure: LEFT TOTAL HIP ARTHROPLASTY ANTERIOR APPROACH;  Surgeon: Mcarthur Rossetti, MD;  Location: [facility];  Service: Orthopedics;  Laterality: Left;  . TOTAL KNEE ARTHROPLASTY Left ~ 2003  . TUBAL LIGATION    . uterine tumor    . VENTRAL HERNIA REPAIR      Family History  Problem Relation Age of Onset  . Diabetes Mother   . Stroke Mother   . Hypertension Father   . Stroke Father   .  Hyperlipidemia Sister   . Hypertension Sister   . Aneurysm Sister   . Hyperlipidemia Brother   . Hypertension Brother     SOCIAL HISTORY: Social History   Socioeconomic History  . Marital status: Widowed    Spouse name: Not on file  . Number of children: Not on file  . Years of education: Not on file  . Highest education level: Not on file  Occupational History  . Not on file  Tobacco Use  . Smoking status: Current Every Day Smoker    Packs/day: 0.50    Years: 40.00    Pack years: 20.00    Types: Cigarettes  . Smokeless tobacco: Never Used  Substance and Sexual Activity  . Alcohol use: No     Alcohol/week: 0.0 standard drinks  . Drug use: No  . Sexual activity: Not on file  Other Topics Concern  . Not on file  Social History Narrative  . Not on file   Social Determinants of Health   Financial Resource Strain:   . Difficulty of Paying Living Expenses: Not on file  Food Insecurity:   . Worried About Charity fundraiser in the Last Year: Not on file  . Ran Out of Food in the Last Year: Not on file  Transportation Needs:   . Lack of Transportation (Medical): Not on file  . Lack of Transportation (Non-Medical): Not on file  Physical Activity:   . Days of Exercise per Week: Not on file  . Minutes of Exercise per Session: Not on file  Stress:   . Feeling of Stress : Not on file  Social Connections:   . Frequency of Communication with Friends and Family: Not on file  . Frequency of Social Gatherings with Friends and Family: Not on file  . Attends Religious Services: Not on file  . Active Member of Clubs or Organizations: Not on file  . Attends Archivist Meetings: Not on file  . Marital Status: Not on file  Intimate Partner Violence:   . Fear of Current or Ex-Partner: Not on file  . Emotionally Abused: Not on file  . Physically Abused: Not on file  . Sexually Abused: Not on file    Allergies  Allergen Reactions  . Keflex [Cephalexin] Shortness Of Breath and Other (See Comments)    Chest pain, headache  . Lipitor [Atorvastatin] Shortness Of Breath and Other (See Comments)    Severe pain/cramping in legs- "I cannot walk, talk, or breathe"  . Codeine Other (See Comments)    Disrupted patient's equilibrium- "Made my world flip upside down"  . Hydrochlorothiazide     Other reaction(s): lightheaded, nausea  . Hydrocodone-Acetaminophen     Other reaction(s): "get crazy"  . Metformin Hcl     Other reaction(s): upset stomach  . Tramadol Hcl     Other reaction(s): "get crazy"  . Azor [Amlodipine-Olmesartan] Swelling, Palpitations and Rash    Amlodipine alone  resumed 12/2013, tolerating  . Lisinopril Cough  . Penicillins Dermatitis and Other (See Comments)    Did it involve swelling of the face/tongue/throat, SOB, or low BP? No, just worsened eczema Did it involve sudden or severe rash/hives, skin peeling, or any reaction on the inside of your mouth or nose? No Did you need to seek medical attention at a hospital or doctor's office? No When did it last happen? "More than 10 years ago" If all above answers are "NO", may proceed with cephalosporin use.     Current Outpatient Medications  Medication Sig Dispense Refill  . acetaminophen (TYLENOL) 650 MG CR tablet Take 650 mg by mouth daily.    Marland Kitchen albuterol (PROVENTIL HFA;VENTOLIN HFA) 108 (90 Base) MCG/ACT inhaler Inhale 2 puffs into the lungs every 4 (four) hours as needed for wheezing or shortness of breath (or coughing). 1 Inhaler 0  . amLODipine (NORVASC) 10 MG tablet     . atorvastatin (LIPITOR) 40 MG tablet     . clopidogrel (PLAVIX) 75 MG tablet Take 75 mg by mouth daily.    Marland Kitchen esomeprazole (NEXIUM) 40 MG capsule Oral once a day    . glimepiride (AMARYL) 2 MG tablet Take 2-3 mg by mouth 2 (two) times daily. Take 3 mg by mouth in the morning and 2 mg at bedtime    . isosorbide mononitrate (IMDUR) 60 MG 24 hr tablet Take 60 mg by mouth daily.  3  . losartan (COZAAR) 100 MG tablet Take 100 mg by mouth daily.    Marland Kitchen losartan (COZAAR) 50 MG tablet Take 50 mg by mouth daily.    . metoprolol (TOPROL-XL) 50 MG 24 hr tablet Take 50 mg by mouth daily.     . nitroGLYCERIN (NITROSTAT) 0.4 MG SL tablet Place 0.4 mg under the tongue every 5 (five) minutes as needed for chest pain.    . NON FORMULARY Place 1-2 drops into both eyes See admin instructions. Bausch + Lomb Advanced Eye Relief Dry Eye Lubricant Eye Drops: Instill 1-2 drops into each eye one to two times a day    . spironolactone (ALDACTONE) 25 MG tablet Take 25 mg by mouth daily.    . ticagrelor (BRILINTA) 90 MG TABS tablet Orally Twice a day    .  vitamin B-12 (CYANOCOBALAMIN) 50 MCG tablet Take 50 mcg by mouth daily.    . baclofen (LIORESAL) 10 MG tablet Take 0.5-1 tablets (5-10 mg total) by mouth 3 (three) times daily as needed for muscle spasms. (Patient not taking: Reported on 04/07/2019) 30 each 3   No current facility-administered medications for this visit.    ROS:   General:  No weight loss, Fever, chills  HEENT: No recent headaches, no nasal bleeding, no visual changes, no sore throat  Neurologic: No dizziness, blackouts, seizures. No recent symptoms of stroke or mini- stroke. No recent episodes of slurred speech, or temporary blindness.  Cardiac: No recent episodes of chest pain/pressure, no shortness of breath at rest.  + shortness of breath with exertion.  Denies history of atrial fibrillation or irregular heartbeat  Vascular: No history of rest pain in feet.  No history of claudication.  No history of non-healing ulcer, No history of DVT   Pulmonary: No home oxygen, no productive cough, no hemoptysis,  No asthma or wheezing  Musculoskeletal:  [X]  Arthritis, [ ]  Low back pain,  [X]  Joint pain  Hematologic:No history of hypercoagulable state.  No history of easy bleeding.  No history of anemia  Gastrointestinal: No hematochezia or melena,  No gastroesophageal reflux, no trouble swallowing  Urinary: [ ]  chronic Kidney disease, [ ]  on HD - [ ]  MWF or [ ]  TTHS, [ ]  Burning with urination, [ ]  Frequent urination, [ ]  Difficulty urinating;   Skin: No rashes  Psychological: No history of anxiety,  No history of depression   Physical Examination  Vitals:   06/19/19 1107  BP: (!) 183/80  Pulse: 62  Resp: 20  Temp: (!) 97.2 F (36.2 C)  SpO2: 98%  Weight: 178 lb (80.7 kg)  Height:  5\' 7"  (1.702 m)    Body mass index is 27.88 kg/m.  General:  Alert and oriented, no acute distress HEENT: Normal Neck: No JVD Cardiac: Regular Rate and Rhythm  Abdomen: Soft, non-tender, non-distended, no mass Skin: No  rash Extremity Pulses:  2+ radial, brachial, femoral, absent dorsalis pedis, posterior tibial pulses bilaterally Musculoskeletal: No deformity or edema  Neurologic: Upper and lower extremity motor 5/5 and symmetric  DATA:  Patient had a carotid duplex exam today this showed no significant carotid stenosis.  She also had bilateral ABIs performed today which were 0.7 on the left 0.5 on the right.  She also had an arterial duplex exam of her left leg bypass graft which showed that this was patent.  She also had a renal duplex exam which suggested a greater than 60% right renal artery stenosis with velocities of 254 cm/s.  ASSESSMENT: Poorly controlled blood pressure requiring 4 medications at this point with systolic pressures still in the 180s possible renal artery stenosis   PLAN: Renal angiogram possible intervention scheduled for June 25, 2019.  Risk benefits possible complications of procedure details including not limited to bleeding infection vessel injury were discussed with the patient and her daughter.  They agree to proceed.   Ruta Hinds, MD [facility] Office: [phone] Pager: [phone]

## 2019-06-23 ENCOUNTER — Other Ambulatory Visit (HOSPITAL_COMMUNITY)
Admission: RE | Admit: 2019-06-23 | Discharge: 2019-06-23 | Disposition: A | Discharge status: Home / Self Care | Payer: Medicare HMO | Source: Ambulatory Visit | Attending: Vascular Surgery | Admitting: Vascular Surgery

## 2019-06-23 DIAGNOSIS — Z20822 Contact with and (suspected) exposure to covid-19: Secondary | ICD-10-CM | POA: Insufficient documentation

## 2019-06-23 DIAGNOSIS — Z01812 Encounter for preprocedural laboratory examination: Secondary | ICD-10-CM | POA: Insufficient documentation

## 2019-06-23 LAB — SARS CORONAVIRUS 2 (TAT 6-24 HRS): SARS Coronavirus 2: NEGATIVE

## 2019-06-25 ENCOUNTER — Other Ambulatory Visit: Payer: Self-pay

## 2019-06-25 ENCOUNTER — Encounter (HOSPITAL_COMMUNITY)
Admission: RE | Disposition: A | Discharge status: Home / Self Care | Payer: Self-pay | Source: Home / Self Care | Attending: Vascular Surgery

## 2019-06-25 ENCOUNTER — Ambulatory Visit (HOSPITAL_COMMUNITY)
Admission: RE | Admit: 2019-06-25 | Discharge: 2019-06-25 | Disposition: A | Discharge status: Home / Self Care | Payer: Medicare HMO | Attending: Vascular Surgery | Admitting: Vascular Surgery

## 2019-06-25 DIAGNOSIS — Z7984 Long term (current) use of oral hypoglycemic drugs: Secondary | ICD-10-CM | POA: Diagnosis not present

## 2019-06-25 DIAGNOSIS — H409 Unspecified glaucoma: Secondary | ICD-10-CM | POA: Diagnosis not present

## 2019-06-25 DIAGNOSIS — K219 Gastro-esophageal reflux disease without esophagitis: Secondary | ICD-10-CM | POA: Diagnosis not present

## 2019-06-25 DIAGNOSIS — I252 Old myocardial infarction: Secondary | ICD-10-CM | POA: Diagnosis not present

## 2019-06-25 DIAGNOSIS — I714 Abdominal aortic aneurysm, without rupture: Secondary | ICD-10-CM | POA: Insufficient documentation

## 2019-06-25 DIAGNOSIS — Z96642 Presence of left artificial hip joint: Secondary | ICD-10-CM | POA: Diagnosis not present

## 2019-06-25 DIAGNOSIS — Z955 Presence of coronary angioplasty implant and graft: Secondary | ICD-10-CM | POA: Insufficient documentation

## 2019-06-25 DIAGNOSIS — Z8349 Family history of other endocrine, nutritional and metabolic diseases: Secondary | ICD-10-CM | POA: Diagnosis not present

## 2019-06-25 DIAGNOSIS — F17210 Nicotine dependence, cigarettes, uncomplicated: Secondary | ICD-10-CM | POA: Insufficient documentation

## 2019-06-25 DIAGNOSIS — Z881 Allergy status to other antibiotic agents status: Secondary | ICD-10-CM | POA: Insufficient documentation

## 2019-06-25 DIAGNOSIS — Z8673 Personal history of transient ischemic attack (TIA), and cerebral infarction without residual deficits: Secondary | ICD-10-CM | POA: Insufficient documentation

## 2019-06-25 DIAGNOSIS — E1151 Type 2 diabetes mellitus with diabetic peripheral angiopathy without gangrene: Secondary | ICD-10-CM | POA: Insufficient documentation

## 2019-06-25 DIAGNOSIS — I150 Renovascular hypertension: Secondary | ICD-10-CM | POA: Diagnosis not present

## 2019-06-25 DIAGNOSIS — Z95828 Presence of other vascular implants and grafts: Secondary | ICD-10-CM | POA: Insufficient documentation

## 2019-06-25 DIAGNOSIS — I701 Atherosclerosis of renal artery: Secondary | ICD-10-CM | POA: Diagnosis not present

## 2019-06-25 DIAGNOSIS — Z833 Family history of diabetes mellitus: Secondary | ICD-10-CM | POA: Diagnosis not present

## 2019-06-25 DIAGNOSIS — M1990 Unspecified osteoarthritis, unspecified site: Secondary | ICD-10-CM | POA: Diagnosis not present

## 2019-06-25 DIAGNOSIS — Z8249 Family history of ischemic heart disease and other diseases of the circulatory system: Secondary | ICD-10-CM | POA: Insufficient documentation

## 2019-06-25 DIAGNOSIS — E785 Hyperlipidemia, unspecified: Secondary | ICD-10-CM | POA: Insufficient documentation

## 2019-06-25 DIAGNOSIS — Z888 Allergy status to other drugs, medicaments and biological substances status: Secondary | ICD-10-CM | POA: Diagnosis not present

## 2019-06-25 DIAGNOSIS — Z880 Allergy status to penicillin: Secondary | ICD-10-CM | POA: Diagnosis not present

## 2019-06-25 DIAGNOSIS — Z7902 Long term (current) use of antithrombotics/antiplatelets: Secondary | ICD-10-CM | POA: Insufficient documentation

## 2019-06-25 DIAGNOSIS — Z885 Allergy status to narcotic agent status: Secondary | ICD-10-CM | POA: Diagnosis not present

## 2019-06-25 DIAGNOSIS — Z79899 Other long term (current) drug therapy: Secondary | ICD-10-CM | POA: Diagnosis not present

## 2019-06-25 HISTORY — PX: RENAL ANGIOGRAPHY: CATH118260

## 2019-06-25 HISTORY — PX: PERIPHERAL VASCULAR INTERVENTION: CATH118257

## 2019-06-25 LAB — POCT I-STAT, CHEM 8
BUN: 24 mg/dL — ABNORMAL HIGH (ref 8–23)
Calcium, Ion: 1.24 mmol/L (ref 1.15–1.40)
Chloride: 108 mmol/L (ref 98–111)
Creatinine, Ser: 1.6 mg/dL — ABNORMAL HIGH (ref 0.44–1.00)
Glucose, Bld: 124 mg/dL — ABNORMAL HIGH (ref 70–99)
HCT: 35 % — ABNORMAL LOW (ref 36.0–46.0)
Hemoglobin: 11.9 g/dL — ABNORMAL LOW (ref 12.0–15.0)
Potassium: 4.7 mmol/L (ref 3.5–5.1)
Sodium: 142 mmol/L (ref 135–145)
TCO2: 26 mmol/L (ref 22–32)

## 2019-06-25 LAB — GLUCOSE, CAPILLARY: Glucose-Capillary: 156 mg/dL — ABNORMAL HIGH (ref 70–99)

## 2019-06-25 LAB — POCT ACTIVATED CLOTTING TIME
Activated Clotting Time: 290 seconds
Activated Clotting Time: 252 seconds
Activated Clotting Time: 219 seconds
Activated Clotting Time: 197 seconds
Activated Clotting Time: 169 seconds

## 2019-06-25 SURGERY — RENAL ANGIOGRAPHY
Anesthesia: LOCAL | Laterality: Bilateral

## 2019-06-25 MED ORDER — LIDOCAINE HCL (PF) 1 % IJ SOLN
INTRAMUSCULAR | Status: DC | PRN
  Administered 2019-06-25: 15 mL

## 2019-06-25 MED ORDER — HEPARIN (PORCINE) IN NACL 1000-0.9 UT/500ML-% IV SOLN
INTRAVENOUS | Status: DC | PRN
  Administered 2019-06-25: 500 mL

## 2019-06-25 MED ORDER — SODIUM CHLORIDE 0.9% FLUSH: 3.0000 mL | INTRAVENOUS | Status: DC | PRN

## 2019-06-25 MED ORDER — OXYCODONE HCL 5 MG PO TABS
5.0000 mg | ORAL_TABLET | ORAL | Status: DC | PRN
  Administered 2019-06-25 (×2): 5 mg via ORAL

## 2019-06-25 MED ORDER — HYDRALAZINE HCL 20 MG/ML IJ SOLN
INTRAMUSCULAR | Status: DC | PRN
  Administered 2019-06-25 (×2): 10 mg via INTRAVENOUS

## 2019-06-25 MED ORDER — ONDANSETRON HCL 4 MG/2ML IJ SOLN
INTRAMUSCULAR | Status: DC | PRN
  Administered 2019-06-25: 4 mg via INTRAVENOUS

## 2019-06-25 MED ORDER — ONDANSETRON HCL 4 MG/2ML IJ SOLN: 4.0000 mg | Freq: Four times a day (QID) | INTRAMUSCULAR | Status: DC | PRN

## 2019-06-25 MED ORDER — ACETAMINOPHEN 325 MG PO TABS: 650.0000 mg | ORAL_TABLET | ORAL | Status: DC | PRN

## 2019-06-25 MED ORDER — HYDRALAZINE HCL 20 MG/ML IJ SOLN
INTRAMUSCULAR | Status: AC
  Filled 2019-06-25: qty 1

## 2019-06-25 MED ORDER — ATROPINE SULFATE 1 MG/10ML IJ SOSY
PREFILLED_SYRINGE | INTRAMUSCULAR | Status: AC
  Filled 2019-06-25: qty 10

## 2019-06-25 MED ORDER — IODIXANOL 320 MG/ML IV SOLN
INTRAVENOUS | Status: DC | PRN
  Administered 2019-06-25: 100 mL

## 2019-06-25 MED ORDER — LABETALOL HCL 5 MG/ML IV SOLN
INTRAVENOUS | Status: AC
  Filled 2019-06-25: qty 4

## 2019-06-25 MED ORDER — FENTANYL CITRATE (PF) 100 MCG/2ML IJ SOLN
INTRAMUSCULAR | Status: DC | PRN
  Administered 2019-06-25: 25 ug via INTRAVENOUS

## 2019-06-25 MED ORDER — CLOPIDOGREL BISULFATE 75 MG PO TABS
75.0000 mg | ORAL_TABLET | Freq: Every day | ORAL | Status: DC
  Administered 2019-06-25: 11:00:00 75 mg via ORAL

## 2019-06-25 MED ORDER — LIDOCAINE HCL (PF) 1 % IJ SOLN
INTRAMUSCULAR | Status: AC
  Filled 2019-06-25: qty 30

## 2019-06-25 MED ORDER — SODIUM CHLORIDE 0.9 % IV SOLN: INTRAVENOUS | Status: DC

## 2019-06-25 MED ORDER — MIDAZOLAM HCL 2 MG/2ML IJ SOLN
INTRAMUSCULAR | Status: AC
  Filled 2019-06-25: qty 2

## 2019-06-25 MED ORDER — HEPARIN (PORCINE) IN NACL 1000-0.9 UT/500ML-% IV SOLN
INTRAVENOUS | Status: AC
  Filled 2019-06-25: qty 1000

## 2019-06-25 MED ORDER — SODIUM CHLORIDE 0.9 % IV SOLN: 250.0000 mL | INTRAVENOUS | Status: DC | PRN

## 2019-06-25 MED ORDER — HYDRALAZINE HCL 20 MG/ML IJ SOLN: 5.0000 mg | INTRAMUSCULAR | Status: DC | PRN

## 2019-06-25 MED ORDER — PROMETHAZINE HCL 25 MG/ML IJ SOLN: 12.5000 mg | Freq: Four times a day (QID) | INTRAMUSCULAR | Status: DC | PRN

## 2019-06-25 MED ORDER — LABETALOL HCL 5 MG/ML IV SOLN
10.0000 mg | INTRAVENOUS | Status: DC | PRN
  Administered 2019-06-25: 10 mg via INTRAVENOUS

## 2019-06-25 MED ORDER — PROMETHAZINE HCL 25 MG/ML IJ SOLN
INTRAMUSCULAR | Status: DC | PRN
  Administered 2019-06-25: 12.5 mg via INTRAVENOUS

## 2019-06-25 MED ORDER — FENTANYL CITRATE (PF) 100 MCG/2ML IJ SOLN
INTRAMUSCULAR | Status: AC
  Filled 2019-06-25: qty 2

## 2019-06-25 MED ORDER — MIDAZOLAM HCL 2 MG/2ML IJ SOLN
INTRAMUSCULAR | Status: DC | PRN
  Administered 2019-06-25: 1 mg via INTRAVENOUS

## 2019-06-25 MED ORDER — OXYCODONE HCL 5 MG PO TABS
ORAL_TABLET | ORAL | Status: AC
  Filled 2019-06-25: qty 1

## 2019-06-25 MED ORDER — PROMETHAZINE HCL 25 MG/ML IJ SOLN
INTRAMUSCULAR | Status: AC
  Filled 2019-06-25: qty 1

## 2019-06-25 MED ORDER — FAMOTIDINE IN NACL 20-0.9 MG/50ML-% IV SOLN
INTRAVENOUS | Status: AC | PRN
  Administered 2019-06-25: 20 mg via INTRAVENOUS

## 2019-06-25 MED ORDER — CLOPIDOGREL BISULFATE 75 MG PO TABS: 300.0000 mg | ORAL_TABLET | Freq: Once | ORAL | Status: DC

## 2019-06-25 MED ORDER — ASPIRIN EC 81 MG PO TBEC
81.0000 mg | DELAYED_RELEASE_TABLET | Freq: Every day | ORAL | Status: DC
  Administered 2019-06-25: 15:00:00 81 mg via ORAL
  Filled 2019-06-25: qty 1

## 2019-06-25 MED ORDER — HEPARIN SODIUM (PORCINE) 1000 UNIT/ML IJ SOLN
INTRAMUSCULAR | Status: DC | PRN
  Administered 2019-06-25: 8000 [IU] via INTRAVENOUS

## 2019-06-25 MED ORDER — MORPHINE SULFATE (PF) 2 MG/ML IV SOLN: 2.0000 mg | INTRAVENOUS | Status: DC | PRN

## 2019-06-25 MED ORDER — FAMOTIDINE IN NACL 20-0.9 MG/50ML-% IV SOLN
INTRAVENOUS | Status: AC
  Filled 2019-06-25: qty 50

## 2019-06-25 MED ORDER — CLOPIDOGREL BISULFATE 75 MG PO TABS
ORAL_TABLET | ORAL | Status: AC
  Filled 2019-06-25: qty 1

## 2019-06-25 MED ORDER — SODIUM CHLORIDE 0.9 % IV SOLN: INTRAVENOUS | Status: AC

## 2019-06-25 MED ORDER — ATROPINE SULFATE 1 MG/10ML IJ SOSY
PREFILLED_SYRINGE | INTRAMUSCULAR | Status: DC | PRN
  Administered 2019-06-25: 1 mg via INTRAVENOUS

## 2019-06-25 MED ORDER — ASPIRIN EC 81 MG PO TBEC: 81.0000 mg | DELAYED_RELEASE_TABLET | Freq: Every day | ORAL | 2 refills | Status: DC

## 2019-06-25 MED ORDER — SODIUM CHLORIDE 0.9% FLUSH: 3.0000 mL | Freq: Two times a day (BID) | INTRAVENOUS | Status: DC

## 2019-06-25 SURGICAL SUPPLY — 16 items
CATH BEACON 5 .035 65 KMP TIP (CATHETERS) ×3 IMPLANT
CATH OMNI FLUSH 5F 65CM (CATHETERS) ×3 IMPLANT
KIT ENCORE 26 ADVANTAGE (KITS) ×3 IMPLANT
KIT PV (KITS) ×3 IMPLANT
SHEATH GUIDING 6.5 FR 55X73X9 (SHEATH) ×3 IMPLANT
SHEATH PINNACLE 5F 10CM (SHEATH) ×3 IMPLANT
SHEATH PINNACLE 6F 10CM (SHEATH) ×3 IMPLANT
SHEATH PINNACLE 7F 10CM (SHEATH) ×3 IMPLANT
SHEATH PROBE COVER 6X72 (BAG) ×3 IMPLANT
STENT HERCULINK RX 6.0X12X135 (Permanent Stent) ×3 IMPLANT
STENT HERCULINK RX 6.0X18X135 (Permanent Stent) ×3 IMPLANT
SYR MEDRAD MARK V 150ML (SYRINGE) ×3 IMPLANT
TRANSDUCER W/STOPCOCK (MISCELLANEOUS) ×3 IMPLANT
TRAY PV CATH (CUSTOM PROCEDURE TRAY) ×3 IMPLANT
WIRE BENTSON .035X145CM (WIRE) ×3 IMPLANT
WIRE STABILIZER XS .014X180CM (WIRE) ×9 IMPLANT

## 2019-06-25 NOTE — Op Note (Signed)
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  90% stenosis left renal artery origin stented to 0% residual stenosis 2.  70% right renal artery stenosis stented to 0% residual stenosis 3.  70% stenosis right common iliac origin  Operative details: After obtaining informed consent, the patient was taken to the [facility].  The patient was placed in supine position angio table.  Both groins were prepped and draped in usual sterile fashion.  Local anesthesia was infiltrated of the right common femoral artery.  Ultrasound was used to identify the right common femoral artery and femoral bifurcation.  An introducer needle was used to cannulate the right common femoral artery and an 035 Bentson wire advanced up into the right iliac system under fluoroscopic guidance.  A 5 French sheath was placed over this.  Guidewire would not initially advanced up into the abdominal aorta so a contrast angiogram was obtained.  This showed some tortuosity and kinking of the right common iliac artery adjacent to her previous abdominal aortic aneurysm repair.  With the assistance of a KMP catheter I was able to advance the guidewire up into the abdominal aorta under fluoroscopic guidance.  A 5 French Omni Flush catheter was then advanced over the guidewire in the abdominal aorta and abdominal aortogram was obtained in AP projection.  The left renal artery has a 90% stenosis near its origin.  The right renal artery has a 70% stenosis which is heavily calcified in its midportion.  The infrarenal abdominal aorta is patent.  There is a tube graft which begins about 1 to 2 cm below the renal arteries and extends to the aortic bifurcation.  There is some slight kinking of the  right common iliac artery and narrowing causing about a 70% stenosis.  At this point, the Omni Flush catheter was pulled down just above the origin of the renal arteries and a magnified view was performed to confirm the above findings.  The 5 French sheath in the right groin was then swapped out over a guidewire for a 6-1/2 [place] Oscor sheath.  This was advanced up to the level of the left renal artery origin.  It was then articulated so that the tip of the sheath was engaged in the left renal artery.  An 014 stabilizer wire was then used to cross the lesion.  The patient had been given 8000 units of intravenous heparin prior to crossing the lesion.  ACT was confirmed to be 290.  After crossing the lesion contrast angiogram was obtained to perform measurements of the artery.  A 6 x 12 balloon expandable stent was selected and centered on the lesion.  It was then inflated to 11 atm for 1 minute.  Completion angiogram was performed which showed wide patency of the left renal artery no evidence of dissection.  Guidewire was pulled back and the skin turned over towards the right renal artery.  This required a little more manipulation to engage the artery but I was able to successfully advance the 035 Bentson wire across the lesion and then swapped this out for the 014 stabilizer wire through the KMP catheter.  Contrast angiogram was then again obtained to determine precise location and measurements.  A 6 x 18 balloon expandable stent was selected and centered on the  lesion.  This was then deployed to 11 atm for 1 minute.  During this time the patient experienced a vagal reaction with her heart rate briefly dropping into the mid 20s.  She also became nauseous.  She was given a full stick of atropine with quick recovery of her heart rate into the mid 90s to low 100s.  She was given some Zofran for her nausea.  The balloon was pulled back and a completion angiogram was performed which showed wide patency with no  residual stenosis of the right renal artery and no evidence of dissection.  The sheath was then swapped out for a 7 [place] short sheath over a guidewire.  Aside from the vagal episode the patient tolerated procedure well and there were no complications.  The patient was taken the holding area in stable condition.  Operative management: The patient will be maintained on Plavix and aspirin to maintain stent patency.  She does have a 70% stenosis of her right common iliac artery but currently this is asymptomatic.  In light of the fact that she had a vagal episode we did not address this today.  I will discuss this further with her in the future.  I will also stop the patient's losartan today and continue her other blood pressure medications which include isosorbide metoprolol and amlodipine.  The patient will check her blood pressure over the next few weeks and this may need further adjustment.  I will forward my note to her primary care physician.  Ruta Hinds, MD [facility] Office: [phone]

## 2019-06-25 NOTE — Interval H&P Note (Signed)
History and Physical Interval Note:  06/25/2019 10:18 AM  [name]  has presented today for surgery, with the diagnosis of peripheral vascular disease.  The various methods of treatment have been discussed with the patient and family. After consideration of risks, benefits and other options for treatment, the patient has consented to  Procedure(s) with comments: RENAL ANGIOGRAPHY (Bilateral) PERIPHERAL VASCULAR INTERVENTION - Lt Renal and Rt Renal as a surgical intervention.  The patient's history has been reviewed, patient examined, no change in status, stable for surgery.  I have reviewed the patient's chart and labs.  Questions were answered to the patient's satisfaction.     Ruta Hinds

## 2019-06-25 NOTE — Progress Notes (Signed)
Site area: Right groin a 5 french arterial  Sheath was removed  Site Prior to Removal:  Level 0  Pressure Applied For 20 MINUTES    Bedrest Beginning at 1400pm  Manual:   Yes.    Patient Status During Pull:  stable  Post Pull Groin Site:  Level 0  Post Pull Instructions Given:  Yes.    Post Pull Pulses Present:  Yes.    Dressing Applied:  Yes.    Comments:

## 2019-06-25 NOTE — Progress Notes (Signed)
Pt ambulated in hallway and to bathroom, voided and walked back to room.  Pt right groin site level zero pre and post ambulation.  DC instructions reviewed with daughter and verbalized understanding.  Copies of DC instructions along with pt stent card given to daughter.  Pt DC home with daughter.

## 2019-06-25 NOTE — Progress Notes (Signed)
This RN attempted to gain IV access x2 unsuccessfully. Thayer Headings, RN to try next.

## 2019-06-25 NOTE — Discharge Instructions (Signed)
   [facility]  Discharge Instructions  Renal Angiogram; Angioplasty/Stenting  Please refer to the following instructions for your post-procedure care. Your surgeon or physician assistant will discuss any changes with you.  Activity  Avoid lifting more than 8 pounds (1 gallons of milk) for 72 hours (3 days) after your procedure. You may walk as much as you can tolerate. It's OK to drive after 72 hours.  Bathing/Showering  You may shower the day after your procedure. If you have a bandage, you may remove it at 24- 48 hours. Clean your incision site with mild soap and water. Pat the area dry with a clean towel.  Diet  Drink plenty of fluids to stay hydrated over the next several days.  Resume your pre-procedure diet. There are no special food restrictions following this procedure. All patients with peripheral vascular disease should follow a low fat/low cholesterol diet. In order to heal from your surgery, it is CRITICAL to get adequate nutrition. Your body requires vitamins, minerals, and protein. Vegetables are the best source of vitamins and minerals. Vegetables also provide the perfect balance of protein. Processed food has little nutritional value, so try to avoid this.  Medications  Resume taking all of your medications unless your doctor tells you not to. If your incision is causing pain, you may take over-the-counter pain relievers such as acetaminophen (Tylenol)  Follow Up  Follow up will be arranged at the time of your procedure. You may have an office visit scheduled or may be scheduled for surgery. Ask your surgeon if you have any questions.  Please call us immediately for any of the following conditions: .Severe or worsening pain your legs or feet at rest or with walking. .Increased pain, redness, drainage at your groin puncture site. .Fever of 101 degrees or higher. .If you have any mild or slow bleeding from your puncture site: lie down,  apply firm constant pressure over the area with a piece of gauze or a clean wash cloth for 30 minutes- no peeking!, call 911 right away if you are still bleeding after 30 minutes, or if the bleeding is heavy and unmanageable.  Reduce your risk factors of vascular disease:  Stop smoking. If you would like help call [facility] at 1-800-QUIT-NOW [phone]) or [place] at [phone]. Manage your cholesterol Maintain a desired weight Control your diabetes Keep your blood pressure down  If you have any questions, please call the office at [phone]

## 2019-07-23 ENCOUNTER — Telehealth (HOSPITAL_COMMUNITY): Payer: Self-pay

## 2019-07-23 ENCOUNTER — Other Ambulatory Visit: Payer: Self-pay

## 2019-07-23 DIAGNOSIS — I701 Atherosclerosis of renal artery: Secondary | ICD-10-CM

## 2019-07-23 NOTE — Telephone Encounter (Signed)

## 2019-07-24 ENCOUNTER — Ambulatory Visit (INDEPENDENT_AMBULATORY_CARE_PROVIDER_SITE_OTHER): Payer: Medicare HMO | Admitting: Vascular Surgery

## 2019-07-24 ENCOUNTER — Ambulatory Visit (HOSPITAL_COMMUNITY)
Admission: RE | Admit: 2019-07-24 | Discharge: 2019-07-24 | Disposition: A | Discharge status: Home / Self Care | Payer: Medicare HMO | Source: Ambulatory Visit | Attending: Vascular Surgery | Admitting: Vascular Surgery

## 2019-07-24 ENCOUNTER — Ambulatory Visit: Payer: Medicare HMO | Attending: Internal Medicine

## 2019-07-24 ENCOUNTER — Other Ambulatory Visit: Payer: Self-pay

## 2019-07-24 ENCOUNTER — Encounter: Payer: Self-pay | Admitting: Vascular Surgery

## 2019-07-24 VITALS — BP 136/78 | HR 59 | Temp 97.9°F | Resp 20 | Ht 67.5 in | Wt 170.0 lb

## 2019-07-24 DIAGNOSIS — I739 Peripheral vascular disease, unspecified: Secondary | ICD-10-CM

## 2019-07-24 DIAGNOSIS — Z23 Encounter for immunization: Secondary | ICD-10-CM | POA: Insufficient documentation

## 2019-07-24 DIAGNOSIS — I701 Atherosclerosis of renal artery: Secondary | ICD-10-CM | POA: Diagnosis not present

## 2019-07-24 NOTE — Progress Notes (Signed)
   Covid-19 Vaccination Clinic  Name:  [name]    MRN: [id number] DOB: 03-12-1941  07/24/2019  [name] was observed post Covid-19 immunization for 15 minutes without incidence. She was provided with Vaccine Information Sheet and instruction to access the V-Safe system.   [name] was instructed to call 911 with any severe reactions post vaccine: Marland Kitchen Difficulty breathing  . Swelling of your face and throat  . A fast heartbeat  . A bad rash all over your body  . Dizziness and weakness    Immunizations Administered    Name Date Dose VIS Date Route   Pfizer COVID-19 Vaccine 07/24/2019  2:15 PM 0.3 mL 05/09/2019 Intramuscular   Manufacturer: [place]   Lot: [id number]   [place]: [address]-3

## 2019-07-24 NOTE — Progress Notes (Signed)
Patient name: Mikell Camp MRN: [id number] DOB: 1940/06/25 Sex: female HPI: Shakena Callari is a 79 y.o. female, status post bilateral renal artery stenting June 25, 2019.  She returns today for further follow-up.  She states that her blood pressure control has improved.  She is now off of her losartan and Aldactone.  She previously was on Norvasc Toprol isosorbide and those 2 medications.  Of note she also was noted on her angiogram to have a 70% of her right common iliac artery.  She does have mild claudication symptoms in the right leg.  However, she states she is not bothered enough by this currently to consider a intervention.  She does not have rest pain or tissue loss.  Other medical problems include nominal aortic aneurysm status post repair by Dr. Amedeo Plenty in 2007.  She also has previously had a left femoral to below-knee popliteal bypass in 2016.  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  She is on Plavix aspirin and a statin.  Past Medical History:  Diagnosis Date  . AAA (abdominal aortic aneurysm) ([place])   . Aneurysm ([place])   . Arthritis    osteoarthritis  . Constipation   . Diabetes mellitus    Type 2, on Glimiperide  . GERD (gastroesophageal reflux disease)   . Glaucoma   . Hyperlipidemia   . Hypertension   . Kidney stones   . Myocardial infarction ([place])   . Peripheral vascular disease ([place])   . Pneumonia   . Stroke ([place]) 01/2015   with left side weakness   . TIA (transient ischemic attack)   . Urinary incontinence    Past Surgical History:  Procedure Laterality Date  . ABDOMINAL AORTAGRAM N/A 03/13/2014   Procedure: ABDOMINAL Maxcine Ham;  Surgeon: Elam Dutch, MD;  Location: [facility] CATH LAB;  Service: Cardiovascular;  Laterality: N/A;  . ABDOMINAL AORTIC ANEURYSM REPAIR    . ABDOMINAL AORTIC ANEURYSM REPAIR  ?2012  . ABDOMINAL HYSTERECTOMY     partial  . BACK SURGERY    . CARDIAC  CATHETERIZATION    . CORONARY ANGIOPLASTY  03/2015  . EYE SURGERY Right    laser surgery for blood behind eye, loss of sight  . FEMORAL-POPLITEAL BYPASS GRAFT Left 07/06/2014   Procedure: BYPASS GRAFT FEMORAL-POPLITEAL ARTERY;  Surgeon: Elam Dutch, MD;  Location: [facility];  Service: Vascular;  Laterality: Left;  . HERNIA REPAIR    . JOINT REPLACEMENT    . LEFT HEART CATHETERIZATION WITH CORONARY ANGIOGRAM N/A 04/12/2014   Procedure: LEFT HEART CATHETERIZATION WITH CORONARY ANGIOGRAM;  Surgeon: Clent Demark, MD;  Location: [facility] CATH LAB;  Service: Cardiovascular;  Laterality: N/A;  . PERCUTANEOUS CORONARY STENT INTERVENTION (PCI-S)  04/12/2014   Procedure: PERCUTANEOUS CORONARY STENT INTERVENTION (PCI-S);  Surgeon: Clent Demark, MD;  Location: [facility] CATH LAB;  Service: Cardiovascular;;  prox and mid RCA  . PERIPHERAL VASCULAR INTERVENTION  06/25/2019   Procedure: PERIPHERAL VASCULAR INTERVENTION;  Surgeon: Elam Dutch, MD;  Location: [facility] CV LAB;  Service: Cardiovascular;;  Lt Renal and Rt Renal  . RENAL ANGIOGRAPHY Bilateral 06/25/2019   Procedure: RENAL ANGIOGRAPHY;  Surgeon: Elam Dutch, MD;  Location: [facility] CV LAB;  Service: Cardiovascular;  Laterality: Bilateral;  . TOTAL HIP ARTHROPLASTY Left 05/18/2015  . TOTAL HIP ARTHROPLASTY Left 05/18/2015   Procedure: LEFT TOTAL HIP ARTHROPLASTY ANTERIOR APPROACH;  Surgeon: Mcarthur Rossetti, MD;  Location: [facility];  Service: Orthopedics;  Laterality: Left;  . TOTAL KNEE ARTHROPLASTY Left ~ 2003  . TUBAL LIGATION    . uterine tumor    . VENTRAL HERNIA REPAIR      Family History  Problem Relation Age of Onset  . Diabetes Mother   . Stroke Mother   . Hypertension Father   . Stroke Father   . Hyperlipidemia Sister   . Hypertension Sister   . Aneurysm Sister   . Hyperlipidemia Brother   . Hypertension Brother     SOCIAL HISTORY: Social History   Socioeconomic History  . Marital status: Widowed    Spouse  name: Not on file  . Number of children: Not on file  . Years of education: Not on file  . Highest education level: Not on file  Occupational History  . Not on file  Tobacco Use  . Smoking status: Current Every Day Smoker    Packs/day: 0.50    Years: 40.00    Pack years: 20.00    Types: Cigarettes  . Smokeless tobacco: Never Used  Substance and Sexual Activity  . Alcohol use: No    Alcohol/week: 0.0 standard drinks  . Drug use: No  . Sexual activity: Not on file  Other Topics Concern  . Not on file  Social History Narrative  . Not on file   Social Determinants of Health   Financial Resource Strain:   . Difficulty of Paying Living Expenses: Not on file  Food Insecurity:   . Worried About Charity fundraiser in the Last Year: Not on file  . Ran Out of Food in the Last Year: Not on file  Transportation Needs:   . Lack of Transportation (Medical): Not on file  . Lack of Transportation (Non-Medical): Not on file  Physical Activity:   . Days of Exercise per Week: Not on file  . Minutes of Exercise per Session: Not on file  Stress:   . Feeling of Stress : Not on file  Social Connections:   . Frequency of Communication with Friends and Family: Not on file  . Frequency of Social Gatherings with Friends and Family: Not on file  . Attends Religious Services: Not on file  . Active Member of Clubs or Organizations: Not on file  . Attends Archivist Meetings: Not on file  . Marital Status: Not on file  Intimate Partner Violence:   . Fear of Current or Ex-Partner: Not on file  . Emotionally Abused: Not on file  . Physically Abused: Not on file  . Sexually Abused: Not on file    Allergies  Allergen Reactions  . Keflex [Cephalexin] Shortness Of Breath and Other (See Comments)    Chest pain, headache  . Lipitor [Atorvastatin] Shortness Of Breath and Other (See Comments)    Severe pain/cramping in legs- "I cannot walk, talk, or breathe"  . Codeine Other (See Comments)     Disrupted patient's equilibrium- "Made my world flip upside down"  . Hydrochlorothiazide     Other reaction(s): lightheaded, nausea  . Hydrocodone-Acetaminophen     Other reaction(s): "get crazy"  . Metformin Hcl     Other reaction(s): upset stomach  . Tramadol Hcl     Other reaction(s): "get crazy"  . Azor [Amlodipine-Olmesartan] Swelling, Palpitations and Rash    Amlodipine alone resumed 12/2013, tolerating  . Lisinopril Cough  . Penicillins Dermatitis and Other (See Comments)    Did it involve swelling of the face/tongue/throat, SOB, or  low BP? No, just worsened eczema Did it involve sudden or severe rash/hives, skin peeling, or any reaction on the inside of your mouth or nose? No Did you need to seek medical attention at a hospital or doctor's office? No When did it last happen? "More than 10 years ago" If all above answers are "NO", may proceed with cephalosporin use.     Current Outpatient Medications  Medication Sig Dispense Refill  . acetaminophen (TYLENOL) 650 MG CR tablet Take 650 mg by mouth daily.    Marland Kitchen albuterol (PROVENTIL HFA;VENTOLIN HFA) 108 (90 Base) MCG/ACT inhaler Inhale 2 puffs into the lungs every 4 (four) hours as needed for wheezing or shortness of breath (or coughing). 1 Inhaler 0  . amLODipine (NORVASC) 10 MG tablet Take 10 mg by mouth daily.     Marland Kitchen aspirin EC 81 MG tablet Take 1 tablet (81 mg total) by mouth daily. 150 tablet 2  . atorvastatin (LIPITOR) 20 MG tablet Take 20 mg by mouth daily.    . clopidogrel (PLAVIX) 75 MG tablet Take 75 mg by mouth daily.    Marland Kitchen esomeprazole (NEXIUM) 40 MG capsule Take 40 mg by mouth daily.     . isosorbide mononitrate (IMDUR) 60 MG 24 hr tablet Take 60 mg by mouth daily.  3  . metoprolol succinate (TOPROL-XL) 100 MG 24 hr tablet Take 100 mg by mouth daily.    . nitroGLYCERIN (NITROSTAT) 0.4 MG SL tablet Place 0.4 mg under the tongue every 5 (five) minutes as needed for chest pain.    Vladimir Faster Glycol-Propyl Glycol  (LUBRICANT EYE DROPS) 0.4-0.3 % SOLN Place 1 drop into both eyes 3 (three) times daily as needed (dry/irritated eyes.). [employer] Advanced Eye Relief Dry Eye Lubricant Eye Drops     Current Facility-Administered Medications  Medication Dose Route Frequency Provider Last Rate Last Admin  . promethazine (PHENERGAN) injection 12.5 mg  12.5 mg Intravenous Q6H PRN Fields, Jessy Oto, MD        ROS:   General:  No weight loss, Fever, chills  HEENT: No recent headaches, no nasal bleeding, no visual changes, no sore throat  Neurologic: No dizziness, blackouts, seizures. No recent symptoms of stroke or mini- stroke. No recent episodes of slurred speech, or temporary blindness.  Cardiac: No recent episodes of chest pain/pressure, no shortness of breath at rest.  No shortness of breath with exertion.  Denies history of atrial fibrillation or irregular heartbeat  Vascular: No history of rest pain in feet.  No history of claudication.  No history of non-healing ulcer, No history of DVT   Pulmonary: No home oxygen, no productive cough, no hemoptysis,  No asthma or wheezing  Musculoskeletal:  [X]  Arthritis, [ ]  Low back pain,  [X]  Joint pain  Hematologic:No history of hypercoagulable state.  No history of easy bleeding.  No history of anemia  Gastrointestinal: No hematochezia or melena,  No gastroesophageal reflux, no trouble swallowing  Urinary: [X]  chronic Kidney disease, [ ]  on HD - [ ]  MWF or [ ]  TTHS, [ ]  Burning with urination, [ ]  Frequent urination, [ ]  Difficulty urinating;   Skin: No rashes  Psychological: No history of anxiety,  No history of depression   Physical Examination  Vitals:   07/24/19 1012  BP: 136/78  Pulse: (!) 59  Resp: 20  Temp: 97.9 F (36.6 C)  SpO2: 99%  Weight: 170 lb (77.1 kg)  Height: 5' 7.5" (1.715 m)    Body mass index is  26.23 kg/m.  General:  Alert and oriented, no acute distress HEENT: Normal Neck: No JVD Pulmonary: Clear to auscultation  bilaterally Cardiac: Regular Rate and Rhythm  Abdomen: Soft, non-tender, non-distended, no mass Skin: No rash Extremity Pulses:  2+ radial, brachial, 1+ right 2+ left femoral Musculoskeletal: No deformity or edema  Neurologic: Upper and lower extremity motor 5/5 and symmetric  DATA:  We attempted to do a renal artery duplex scan today but due to overlying bowel gas and the patient's smoking prior to the visit we were unable to visualize either renal artery.  ASSESSMENT: Status post bilateral renal artery stenting.  She is now on less blood pressure medication with reasonable systolic pressure in the 003K today.  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  The patient will continue to monitor her blood pressure.  If she has more difficulty controlling her blood pressure or has to have added medications we would revisit the right renal or left renal artery stenosis.  Otherwise she will have a renal duplex scan in 1 year and be seen in our APP clinic.  2 patient with known right common iliac stenosis mildly symptomatic she will call us in the future if she wishes intervention for this.  ABI 0.03 June 2019  3.  Left femoral below-knee popliteal bypass currently asymptomatic last ABI 0.05 June 2019 bypass was patent with some element of inflow stenosis.  Probably needs repeat duplex and ABIs in 1 year  4.  Apparent left subclavian artery currently refusing treatment no further intervention at this point   Ruta Hinds, MD [facility] [place] Office: [phone] Pager: [phone]

## 2019-07-25 ENCOUNTER — Other Ambulatory Visit: Payer: Self-pay | Admitting: *Deleted

## 2019-07-25 DIAGNOSIS — I701 Atherosclerosis of renal artery: Secondary | ICD-10-CM

## 2019-07-25 DIAGNOSIS — I739 Peripheral vascular disease, unspecified: Secondary | ICD-10-CM

## 2019-07-30 DIAGNOSIS — E785 Hyperlipidemia, unspecified: Secondary | ICD-10-CM | POA: Diagnosis not present

## 2019-07-30 DIAGNOSIS — I10 Essential (primary) hypertension: Secondary | ICD-10-CM | POA: Diagnosis not present

## 2019-07-30 DIAGNOSIS — I714 Abdominal aortic aneurysm, without rupture: Secondary | ICD-10-CM | POA: Diagnosis not present

## 2019-07-30 DIAGNOSIS — E1151 Type 2 diabetes mellitus with diabetic peripheral angiopathy without gangrene: Secondary | ICD-10-CM | POA: Diagnosis not present

## 2019-07-30 DIAGNOSIS — E119 Type 2 diabetes mellitus without complications: Secondary | ICD-10-CM | POA: Diagnosis not present

## 2019-07-30 DIAGNOSIS — I639 Cerebral infarction, unspecified: Secondary | ICD-10-CM | POA: Diagnosis not present

## 2019-07-30 DIAGNOSIS — F17290 Nicotine dependence, other tobacco product, uncomplicated: Secondary | ICD-10-CM | POA: Diagnosis not present

## 2019-07-30 DIAGNOSIS — I25118 Atherosclerotic heart disease of native coronary artery with other forms of angina pectoris: Secondary | ICD-10-CM | POA: Diagnosis not present

## 2019-08-19 ENCOUNTER — Ambulatory Visit: Payer: Medicare HMO | Attending: Internal Medicine

## 2019-08-19 DIAGNOSIS — Z23 Encounter for immunization: Secondary | ICD-10-CM

## 2019-08-19 NOTE — Progress Notes (Signed)
   Covid-19 Vaccination Clinic  Name:  [name]    MRN: [id number] DOB: [birth year]  08/19/2019  [name] was observed post Covid-19 immunization for 15 minutes without incident. She was provided with Vaccine Information Sheet and instruction to access the V-Safe system.   [name] was instructed to call 911 with any severe reactions post vaccine: Marland Kitchen Difficulty breathing  . Swelling of face and throat  . A fast heartbeat  . A bad rash all over body  . Dizziness and weakness   Immunizations Administered    Name Date Dose VIS Date Route   Pfizer COVID-19 Vaccine 08/19/2019  8:27 AM 0.3 mL 05/09/2019 Intramuscular   Manufacturer: [place]   Lot: [id number]   [place]: [address]-6

## 2019-09-01 DIAGNOSIS — I701 Atherosclerosis of renal artery: Secondary | ICD-10-CM | POA: Diagnosis not present

## 2019-09-01 DIAGNOSIS — I129 Hypertensive chronic kidney disease with stage 1 through stage 4 chronic kidney disease, or unspecified chronic kidney disease: Secondary | ICD-10-CM | POA: Diagnosis not present

## 2019-09-01 DIAGNOSIS — Z1159 Encounter for screening for other viral diseases: Secondary | ICD-10-CM | POA: Diagnosis not present

## 2019-09-01 DIAGNOSIS — N1832 Chronic kidney disease, stage 3b: Secondary | ICD-10-CM | POA: Diagnosis not present

## 2019-09-01 DIAGNOSIS — E1122 Type 2 diabetes mellitus with diabetic chronic kidney disease: Secondary | ICD-10-CM | POA: Diagnosis not present

## 2019-10-11 ENCOUNTER — Emergency Department (HOSPITAL_COMMUNITY): Payer: Medicare HMO

## 2019-10-11 ENCOUNTER — Inpatient Hospital Stay (HOSPITAL_COMMUNITY): Payer: Medicare HMO

## 2019-10-11 ENCOUNTER — Other Ambulatory Visit: Payer: Self-pay

## 2019-10-11 ENCOUNTER — Inpatient Hospital Stay (HOSPITAL_COMMUNITY)
Admission: EM | Admit: 2019-10-11 | Discharge: 2019-10-15 | DRG: 065 | Disposition: A | Discharge status: Home Health | Payer: Medicare HMO | Attending: Internal Medicine | Admitting: Internal Medicine

## 2019-10-11 ENCOUNTER — Encounter (HOSPITAL_COMMUNITY): Payer: Self-pay | Admitting: Emergency Medicine

## 2019-10-11 DIAGNOSIS — Z7902 Long term (current) use of antithrombotics/antiplatelets: Secondary | ICD-10-CM

## 2019-10-11 DIAGNOSIS — E1351 Other specified diabetes mellitus with diabetic peripheral angiopathy without gangrene: Secondary | ICD-10-CM | POA: Diagnosis present

## 2019-10-11 DIAGNOSIS — N1832 Chronic kidney disease, stage 3b: Secondary | ICD-10-CM | POA: Diagnosis present

## 2019-10-11 DIAGNOSIS — E1122 Type 2 diabetes mellitus with diabetic chronic kidney disease: Secondary | ICD-10-CM | POA: Diagnosis present

## 2019-10-11 DIAGNOSIS — I700 Atherosclerosis of aorta: Secondary | ICD-10-CM | POA: Diagnosis present

## 2019-10-11 DIAGNOSIS — E785 Hyperlipidemia, unspecified: Secondary | ICD-10-CM | POA: Diagnosis present

## 2019-10-11 DIAGNOSIS — Z955 Presence of coronary angioplasty implant and graft: Secondary | ICD-10-CM

## 2019-10-11 DIAGNOSIS — R739 Hyperglycemia, unspecified: Secondary | ICD-10-CM | POA: Diagnosis not present

## 2019-10-11 DIAGNOSIS — J439 Emphysema, unspecified: Secondary | ICD-10-CM | POA: Diagnosis present

## 2019-10-11 DIAGNOSIS — T383X6A Underdosing of insulin and oral hypoglycemic [antidiabetic] drugs, initial encounter: Secondary | ICD-10-CM | POA: Diagnosis present

## 2019-10-11 DIAGNOSIS — I10 Essential (primary) hypertension: Secondary | ICD-10-CM | POA: Diagnosis present

## 2019-10-11 DIAGNOSIS — Z9889 Other specified postprocedural states: Secondary | ICD-10-CM | POA: Diagnosis not present

## 2019-10-11 DIAGNOSIS — H409 Unspecified glaucoma: Secondary | ICD-10-CM | POA: Diagnosis present

## 2019-10-11 DIAGNOSIS — Z8673 Personal history of transient ischemic attack (TIA), and cerebral infarction without residual deficits: Secondary | ICD-10-CM | POA: Diagnosis not present

## 2019-10-11 DIAGNOSIS — I252 Old myocardial infarction: Secondary | ICD-10-CM

## 2019-10-11 DIAGNOSIS — E7800 Pure hypercholesterolemia, unspecified: Secondary | ICD-10-CM | POA: Diagnosis not present

## 2019-10-11 DIAGNOSIS — Z90710 Acquired absence of both cervix and uterus: Secondary | ICD-10-CM

## 2019-10-11 DIAGNOSIS — T461X6A Underdosing of calcium-channel blockers, initial encounter: Secondary | ICD-10-CM | POA: Diagnosis present

## 2019-10-11 DIAGNOSIS — R29702 NIHSS score 2: Secondary | ICD-10-CM | POA: Diagnosis present

## 2019-10-11 DIAGNOSIS — E1151 Type 2 diabetes mellitus with diabetic peripheral angiopathy without gangrene: Secondary | ICD-10-CM | POA: Diagnosis not present

## 2019-10-11 DIAGNOSIS — I639 Cerebral infarction, unspecified: Secondary | ICD-10-CM | POA: Diagnosis not present

## 2019-10-11 DIAGNOSIS — Q278 Other specified congenital malformations of peripheral vascular system: Secondary | ICD-10-CM | POA: Diagnosis not present

## 2019-10-11 DIAGNOSIS — T45526A Underdosing of antithrombotic drugs, initial encounter: Secondary | ICD-10-CM | POA: Diagnosis present

## 2019-10-11 DIAGNOSIS — I739 Peripheral vascular disease, unspecified: Secondary | ICD-10-CM | POA: Diagnosis not present

## 2019-10-11 DIAGNOSIS — I2510 Atherosclerotic heart disease of native coronary artery without angina pectoris: Secondary | ICD-10-CM | POA: Diagnosis present

## 2019-10-11 DIAGNOSIS — Z91138 Patient's unintentional underdosing of medication regimen for other reason: Secondary | ICD-10-CM

## 2019-10-11 DIAGNOSIS — I6389 Other cerebral infarction: Secondary | ICD-10-CM | POA: Diagnosis not present

## 2019-10-11 DIAGNOSIS — F17213 Nicotine dependence, cigarettes, with withdrawal: Secondary | ICD-10-CM | POA: Diagnosis not present

## 2019-10-11 DIAGNOSIS — K219 Gastro-esophageal reflux disease without esophagitis: Secondary | ICD-10-CM | POA: Diagnosis present

## 2019-10-11 DIAGNOSIS — E1165 Type 2 diabetes mellitus with hyperglycemia: Secondary | ICD-10-CM | POA: Diagnosis not present

## 2019-10-11 DIAGNOSIS — I6503 Occlusion and stenosis of bilateral vertebral arteries: Secondary | ICD-10-CM | POA: Diagnosis present

## 2019-10-11 DIAGNOSIS — Z8679 Personal history of other diseases of the circulatory system: Secondary | ICD-10-CM | POA: Diagnosis not present

## 2019-10-11 DIAGNOSIS — I69354 Hemiplegia and hemiparesis following cerebral infarction affecting left non-dominant side: Secondary | ICD-10-CM

## 2019-10-11 DIAGNOSIS — I5189 Other ill-defined heart diseases: Secondary | ICD-10-CM | POA: Diagnosis present

## 2019-10-11 DIAGNOSIS — Z881 Allergy status to other antibiotic agents status: Secondary | ICD-10-CM

## 2019-10-11 DIAGNOSIS — Z8249 Family history of ischemic heart disease and other diseases of the circulatory system: Secondary | ICD-10-CM

## 2019-10-11 DIAGNOSIS — Z96642 Presence of left artificial hip joint: Secondary | ICD-10-CM | POA: Diagnosis present

## 2019-10-11 DIAGNOSIS — R29810 Facial weakness: Secondary | ICD-10-CM | POA: Diagnosis present

## 2019-10-11 DIAGNOSIS — R531 Weakness: Secondary | ICD-10-CM | POA: Diagnosis not present

## 2019-10-11 DIAGNOSIS — I63511 Cerebral infarction due to unspecified occlusion or stenosis of right middle cerebral artery: Principal | ICD-10-CM | POA: Diagnosis present

## 2019-10-11 DIAGNOSIS — Z95820 Peripheral vascular angioplasty status with implants and grafts: Secondary | ICD-10-CM

## 2019-10-11 DIAGNOSIS — Z20822 Contact with and (suspected) exposure to covid-19: Secondary | ICD-10-CM | POA: Diagnosis present

## 2019-10-11 DIAGNOSIS — Z880 Allergy status to penicillin: Secondary | ICD-10-CM

## 2019-10-11 DIAGNOSIS — T466X6A Underdosing of antihyperlipidemic and antiarteriosclerotic drugs, initial encounter: Secondary | ICD-10-CM | POA: Diagnosis present

## 2019-10-11 DIAGNOSIS — R8290 Unspecified abnormal findings in urine: Secondary | ICD-10-CM | POA: Diagnosis present

## 2019-10-11 DIAGNOSIS — I6523 Occlusion and stenosis of bilateral carotid arteries: Secondary | ICD-10-CM | POA: Diagnosis present

## 2019-10-11 DIAGNOSIS — Z823 Family history of stroke: Secondary | ICD-10-CM

## 2019-10-11 DIAGNOSIS — I129 Hypertensive chronic kidney disease with stage 1 through stage 4 chronic kidney disease, or unspecified chronic kidney disease: Secondary | ICD-10-CM | POA: Diagnosis present

## 2019-10-11 DIAGNOSIS — Z7982 Long term (current) use of aspirin: Secondary | ICD-10-CM

## 2019-10-11 DIAGNOSIS — I25119 Atherosclerotic heart disease of native coronary artery with unspecified angina pectoris: Secondary | ICD-10-CM | POA: Diagnosis not present

## 2019-10-11 DIAGNOSIS — Z6826 Body mass index (BMI) 26.0-26.9, adult: Secondary | ICD-10-CM

## 2019-10-11 DIAGNOSIS — Z87442 Personal history of urinary calculi: Secondary | ICD-10-CM

## 2019-10-11 DIAGNOSIS — I63311 Cerebral infarction due to thrombosis of right middle cerebral artery: Secondary | ICD-10-CM

## 2019-10-11 DIAGNOSIS — Z96652 Presence of left artificial knee joint: Secondary | ICD-10-CM | POA: Diagnosis present

## 2019-10-11 DIAGNOSIS — H538 Other visual disturbances: Secondary | ICD-10-CM | POA: Diagnosis present

## 2019-10-11 DIAGNOSIS — T447X6A Underdosing of beta-adrenoreceptor antagonists, initial encounter: Secondary | ICD-10-CM | POA: Diagnosis present

## 2019-10-11 DIAGNOSIS — R0902 Hypoxemia: Secondary | ICD-10-CM | POA: Diagnosis not present

## 2019-10-11 DIAGNOSIS — I63233 Cerebral infarction due to unspecified occlusion or stenosis of bilateral carotid arteries: Secondary | ICD-10-CM | POA: Diagnosis not present

## 2019-10-11 DIAGNOSIS — R630 Anorexia: Secondary | ICD-10-CM | POA: Diagnosis present

## 2019-10-11 DIAGNOSIS — T471X6A Underdosing of other antacids and anti-gastric-secretion drugs, initial encounter: Secondary | ICD-10-CM | POA: Diagnosis present

## 2019-10-11 DIAGNOSIS — T463X6A Underdosing of coronary vasodilators, initial encounter: Secondary | ICD-10-CM | POA: Diagnosis present

## 2019-10-11 DIAGNOSIS — Z888 Allergy status to other drugs, medicaments and biological substances status: Secondary | ICD-10-CM

## 2019-10-11 DIAGNOSIS — Z885 Allergy status to narcotic agent status: Secondary | ICD-10-CM

## 2019-10-11 DIAGNOSIS — Z83438 Family history of other disorder of lipoprotein metabolism and other lipidemia: Secondary | ICD-10-CM

## 2019-10-11 DIAGNOSIS — Z833 Family history of diabetes mellitus: Secondary | ICD-10-CM

## 2019-10-11 LAB — COMPREHENSIVE METABOLIC PANEL
ALT: 10 U/L (ref 0–44)
AST: 12 U/L — ABNORMAL LOW (ref 15–41)
Albumin: 3.5 g/dL (ref 3.5–5.0)
Alkaline Phosphatase: 73 U/L (ref 38–126)
Anion gap: 10 (ref 5–15)
BUN: 14 mg/dL (ref 8–23)
CO2: 25 mmol/L (ref 22–32)
Calcium: 9.6 mg/dL (ref 8.9–10.3)
Chloride: 101 mmol/L (ref 98–111)
Creatinine, Ser: 1.16 mg/dL — ABNORMAL HIGH (ref 0.44–1.00)
GFR calc Af Amer: 51 mL/min — ABNORMAL LOW (ref >60)
GFR calc non Af Amer: 44 mL/min — ABNORMAL LOW (ref >60)
Glucose, Bld: 374 mg/dL — ABNORMAL HIGH (ref 70–99)
Potassium: 4.7 mmol/L (ref 3.5–5.1)
Sodium: 136 mmol/L (ref 135–145)
Total Bilirubin: 1 mg/dL (ref 0.3–1.2)
Total Protein: 8 g/dL (ref 6.5–8.1)

## 2019-10-11 LAB — DIFFERENTIAL
Abs Immature Granulocytes: 0.03 10*3/uL (ref 0.00–0.07)
Basophils Absolute: 0.1 10*3/uL (ref 0.0–0.1)
Basophils Relative: 1 %
Eosinophils Absolute: 0.1 10*3/uL (ref 0.0–0.5)
Eosinophils Relative: 1 %
Immature Granulocytes: 1 %
Lymphocytes Relative: 29 %
Lymphs Abs: 1.8 10*3/uL (ref 0.7–4.0)
Monocytes Absolute: 0.5 10*3/uL (ref 0.1–1.0)
Monocytes Relative: 8 %
Neutro Abs: 4 10*3/uL (ref 1.7–7.7)
Neutrophils Relative %: 60 %

## 2019-10-11 LAB — CBG MONITORING, ED
Glucose-Capillary: 344 mg/dL — ABNORMAL HIGH (ref 70–99)
Glucose-Capillary: 486 mg/dL — ABNORMAL HIGH (ref 70–99)
Glucose-Capillary: 507 mg/dL (ref 70–99)

## 2019-10-11 LAB — CBC
HCT: 42.3 % (ref 36.0–46.0)
Hemoglobin: 13.6 g/dL (ref 12.0–15.0)
MCH: 28.9 pg (ref 26.0–34.0)
MCHC: 32.2 g/dL (ref 30.0–36.0)
MCV: 89.8 fL (ref 80.0–100.0)
Platelets: 166 10*3/uL (ref 150–400)
RBC: 4.71 MIL/uL (ref 3.87–5.11)
RDW: 14.6 % (ref 11.5–15.5)
WBC: 6.4 10*3/uL (ref 4.0–10.5)
nRBC: 0 % (ref 0.0–0.2)

## 2019-10-11 LAB — CREATININE, SERUM
Creatinine, Ser: 1.26 mg/dL — ABNORMAL HIGH (ref 0.44–1.00)
GFR calc Af Amer: 47 mL/min — ABNORMAL LOW (ref >60)
GFR calc non Af Amer: 40 mL/min — ABNORMAL LOW (ref >60)

## 2019-10-11 LAB — LIPID PANEL
Cholesterol: 141 mg/dL (ref 0–200)
HDL: 39 mg/dL — ABNORMAL LOW (ref >40)
LDL Cholesterol: 70 mg/dL (ref 0–99)
Total CHOL/HDL Ratio: 3.6 RATIO
Triglycerides: 159 mg/dL — ABNORMAL HIGH (ref <150)
VLDL: 32 mg/dL (ref 0–40)

## 2019-10-11 LAB — PROTIME-INR
INR: 0.9 (ref 0.8–1.2)
Prothrombin Time: 12.2 seconds (ref 11.4–15.2)

## 2019-10-11 LAB — TROPONIN I (HIGH SENSITIVITY): Troponin I (High Sensitivity): 7 ng/L (ref <18)

## 2019-10-11 LAB — SARS CORONAVIRUS 2 (TAT 6-24 HRS): SARS Coronavirus 2: NEGATIVE

## 2019-10-11 LAB — I-STAT BETA HCG BLOOD, ED (MC, WL, AP ONLY): I-stat hCG, quantitative: 10 m[IU]/mL — ABNORMAL HIGH (ref <5)

## 2019-10-11 LAB — APTT: aPTT: 32 seconds (ref 24–36)

## 2019-10-11 MED ORDER — CLOPIDOGREL BISULFATE 75 MG PO TABS: 75.0000 mg | ORAL_TABLET | Freq: Every day | ORAL | Status: DC

## 2019-10-11 MED ORDER — INSULIN ASPART 100 UNIT/ML ~~LOC~~ SOLN
0.0000 [IU] | Freq: Every day | SUBCUTANEOUS | Status: DC
  Administered 2019-10-14: 2 [IU] via SUBCUTANEOUS

## 2019-10-11 MED ORDER — CLOPIDOGREL BISULFATE 300 MG PO TABS
300.0000 mg | ORAL_TABLET | Freq: Once | ORAL | Status: AC
  Administered 2019-10-11: 300 mg via ORAL
  Filled 2019-10-11: qty 1

## 2019-10-11 MED ORDER — SPIRONOLACTONE 25 MG PO TABS
25.0000 mg | ORAL_TABLET | Freq: Every day | ORAL | Status: DC
  Administered 2019-10-11 – 2019-10-15 (×4): 25 mg via ORAL
  Filled 2019-10-11 (×4): qty 1

## 2019-10-11 MED ORDER — STROKE: EARLY STAGES OF RECOVERY BOOK
Freq: Once | Status: AC
  Filled 2019-10-11 (×2): qty 1

## 2019-10-11 MED ORDER — CLOPIDOGREL BISULFATE 75 MG PO TABS
75.0000 mg | ORAL_TABLET | Freq: Every day | ORAL | Status: DC
  Administered 2019-10-12 – 2019-10-15 (×4): 75 mg via ORAL
  Filled 2019-10-11 (×5): qty 1

## 2019-10-11 MED ORDER — SODIUM CHLORIDE 0.9% FLUSH
10.0000 mL | Freq: Two times a day (BID) | INTRAVENOUS | Status: DC
  Administered 2019-10-11 – 2019-10-15 (×7): 10 mL

## 2019-10-11 MED ORDER — IOHEXOL 350 MG/ML SOLN
75.0000 mL | Freq: Once | INTRAVENOUS | Status: AC | PRN
  Administered 2019-10-11: 75 mL via INTRAVENOUS

## 2019-10-11 MED ORDER — ROSUVASTATIN CALCIUM 5 MG PO TABS
40.0000 mg | ORAL_TABLET | Freq: Every day | ORAL | Status: DC
  Filled 2019-10-11: qty 8

## 2019-10-11 MED ORDER — SODIUM CHLORIDE 0.9% FLUSH: 10.0000 mL | INTRAVENOUS | Status: DC | PRN

## 2019-10-11 MED ORDER — ATORVASTATIN CALCIUM 10 MG PO TABS
20.0000 mg | ORAL_TABLET | Freq: Every day | ORAL | Status: DC
  Administered 2019-10-11: 20 mg via ORAL
  Filled 2019-10-11: qty 2

## 2019-10-11 MED ORDER — AMLODIPINE BESYLATE 5 MG PO TABS
5.0000 mg | ORAL_TABLET | Freq: Every day | ORAL | Status: DC
  Administered 2019-10-11 – 2019-10-15 (×4): 5 mg via ORAL
  Filled 2019-10-11 (×4): qty 1

## 2019-10-11 MED ORDER — ALBUTEROL SULFATE (2.5 MG/3ML) 0.083% IN NEBU: 3.0000 mL | INHALATION_SOLUTION | RESPIRATORY_TRACT | Status: DC | PRN

## 2019-10-11 MED ORDER — INSULIN ASPART 100 UNIT/ML ~~LOC~~ SOLN
7.0000 [IU] | Freq: Once | SUBCUTANEOUS | Status: AC
  Administered 2019-10-11: 7 [IU] via SUBCUTANEOUS

## 2019-10-11 MED ORDER — ISOSORBIDE MONONITRATE ER 60 MG PO TB24
60.0000 mg | ORAL_TABLET | Freq: Every day | ORAL | Status: DC
  Administered 2019-10-11 – 2019-10-15 (×4): 60 mg via ORAL
  Filled 2019-10-11: qty 2
  Filled 2019-10-11 (×3): qty 1

## 2019-10-11 MED ORDER — PANTOPRAZOLE SODIUM 40 MG PO TBEC
40.0000 mg | DELAYED_RELEASE_TABLET | Freq: Every day | ORAL | Status: DC
  Administered 2019-10-11 – 2019-10-15 (×5): 40 mg via ORAL
  Filled 2019-10-11 (×5): qty 1

## 2019-10-11 MED ORDER — HEPARIN SODIUM (PORCINE) 5000 UNIT/ML IJ SOLN
5000.0000 [IU] | Freq: Three times a day (TID) | INTRAMUSCULAR | Status: DC
  Administered 2019-10-11 – 2019-10-15 (×12): 5000 [IU] via SUBCUTANEOUS
  Filled 2019-10-11 (×12): qty 1

## 2019-10-11 MED ORDER — HYDRALAZINE HCL 10 MG PO TABS
10.0000 mg | ORAL_TABLET | Freq: Four times a day (QID) | ORAL | Status: DC | PRN
  Filled 2019-10-11: qty 1

## 2019-10-11 MED ORDER — ASPIRIN EC 81 MG PO TBEC
81.0000 mg | DELAYED_RELEASE_TABLET | Freq: Every day | ORAL | Status: DC
  Administered 2019-10-11 – 2019-10-15 (×5): 81 mg via ORAL
  Filled 2019-10-11 (×5): qty 1

## 2019-10-11 MED ORDER — ACETAMINOPHEN 325 MG PO TABS: 650.0000 mg | ORAL_TABLET | Freq: Every evening | ORAL | Status: DC | PRN

## 2019-10-11 MED ORDER — METOPROLOL SUCCINATE ER 100 MG PO TB24
100.0000 mg | ORAL_TABLET | Freq: Every day | ORAL | Status: DC
  Administered 2019-10-11 – 2019-10-15 (×5): 100 mg via ORAL
  Filled 2019-10-11 (×2): qty 1
  Filled 2019-10-11 (×2): qty 4
  Filled 2019-10-11: qty 1

## 2019-10-11 MED ORDER — INSULIN ASPART 100 UNIT/ML ~~LOC~~ SOLN
0.0000 [IU] | Freq: Three times a day (TID) | SUBCUTANEOUS | Status: DC
  Administered 2019-10-12: 8 [IU] via SUBCUTANEOUS
  Administered 2019-10-12: 15 [IU] via SUBCUTANEOUS
  Administered 2019-10-13: 8 [IU] via SUBCUTANEOUS
  Administered 2019-10-13: 5 [IU] via SUBCUTANEOUS
  Administered 2019-10-13: 11 [IU] via SUBCUTANEOUS
  Administered 2019-10-14: 3 [IU] via SUBCUTANEOUS
  Administered 2019-10-14: 5 [IU] via SUBCUTANEOUS
  Administered 2019-10-14 – 2019-10-15 (×2): 3 [IU] via SUBCUTANEOUS
  Administered 2019-10-15: 5 [IU] via SUBCUTANEOUS
  Administered 2019-10-15: 3 [IU] via SUBCUTANEOUS

## 2019-10-11 NOTE — ED Notes (Signed)
Out to CT 

## 2019-10-11 NOTE — ED Notes (Signed)
Admitting paged in reference to glucose levels and level of care for appropriate floor.

## 2019-10-11 NOTE — Consult Note (Addendum)
NEURO HOSPITALIST  CONSULT   Requesting Physician: Dr. Langston Masker    Chief Complaint: increased left side weakness  History obtained from:  Patient   HPI:                                                                                                                                         Moe Brier is an 79 y.o. 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  Neurology consulted for infarct seen on MRI.  Patient states that went to bed at 2030 and was her usual self. she does have some weakness on her left side from a previous CVA but stated that  when she woke up this morning she was unable to open and close her hand which she is normally able to do and she had more trouble walking she felt like she was dragging her left foot more than usual and needed to rely on using the walker more than usual.  At baseline she is able to care for herself and she is also primary caregiver to her son's daughter who had a stroke.  Patient endorses smoking half a pack cigarettes a day. denies chest pain shortness of breath.  Endorses having intermittent blurred vision for the past month.  ED course:  CT head: No hemorrhage MRI: Right 3 to 4 mm acute infarction in the posterior limb internal capsule.  BP: 195/82 BG: 344  MRI brain September 2016 showed a right corona radiata infarct, mild bilateral ICA stenosis, moderate bilateral vertebral artery stenosis, chronic bilateral basal ganglia  Modified Rankin: Rankin Score=1  NIHSS:2     Past Medical History:  Diagnosis Date  . AAA (abdominal aortic aneurysm) ([place])   . Aneurysm ([place])   . Arthritis    osteoarthritis  . Constipation   . Diabetes mellitus    Type 2, on Glimiperide  . GERD (gastroesophageal reflux disease)   . Glaucoma   . Hyperlipidemia   . Hypertension   . Kidney stones   .  Myocardial infarction ([place])   . Peripheral vascular disease ([place])   . Pneumonia   . Stroke ([place]) 01/2015   with left side weakness   . TIA (transient ischemic attack)   . Urinary incontinence     Past Surgical History:  Procedure Laterality Date  . ABDOMINAL AORTAGRAM N/A 03/13/2014   Procedure: ABDOMINAL Maxcine Ham;  Surgeon: Elam Dutch, MD;  Location: [facility] C[facility] LAB;  Service: Cardiovascular;  Laterality: N/A;  . ABDOMINAL AORTIC ANEURYSM REPAIR    . ABDOMINAL AORTIC  ANEURYSM REPAIR  ?2012  . ABDOMINAL HYSTERECTOMY     partial  . BACK SURGERY    . CARDIAC CATHETERIZATION    . CORONARY ANGIOPLASTY  03/2015  . EYE SURGERY Right    laser surgery for blood behind eye, loss of sight  . FEMORAL-POPLITEAL BYPASS GRAFT Left 07/06/2014   Procedure: BYPASS GRAFT FEMORAL-POPLITEAL ARTERY;  Surgeon: Elam Dutch, MD;  Location: [place];  Service: Vascular;  Laterality: Left;  . HERNIA REPAIR    . JOINT REPLACEMENT    . LEFT HEART CATHETERIZATION WITH CORONARY ANGIOGRAM N/A 04/12/2014   Procedure: LEFT HEART CATHETERIZATION WITH CORONARY ANGIOGRAM;  Surgeon: Clent Demark, MD;  Location: [place] CATH LAB;  Service: Cardiovascular;  Laterality: N/A;  . PERCUTANEOUS CORONARY STENT INTERVENTION (PCI-S)  04/12/2014   Procedure: PERCUTANEOUS CORONARY STENT INTERVENTION (PCI-S);  Surgeon: Clent Demark, MD;  Location: [facility] CATH LAB;  Service: Cardiovascular;;  prox and mid RCA  . PERIPHERAL VASCULAR INTERVENTION  06/25/2019   Procedure: PERIPHERAL VASCULAR INTERVENTION;  Surgeon: Elam Dutch, MD;  Location: [place] CV LAB;  Service: Cardiovascular;;  Lt Renal and Rt Renal  . RENAL ANGIOGRAPHY Bilateral 06/25/2019   Procedure: RENAL ANGIOGRAPHY;  Surgeon: Elam Dutch, MD;  Location: [place] CV LAB;  Service: Cardiovascular;  Laterality: Bilateral;  . TOTAL HIP ARTHROPLASTY Left 05/18/2015  . TOTAL HIP ARTHROPLASTY Left 05/18/2015   Procedure: LEFT TOTAL HIP ARTHROPLASTY ANTERIOR  APPROACH;  Surgeon: Mcarthur Rossetti, MD;  Location: [place];  Service: Orthopedics;  Laterality: Left;  . TOTAL KNEE ARTHROPLASTY Left ~ 2003  . TUBAL LIGATION    . uterine tumor    . VENTRAL HERNIA REPAIR      Family History  Problem Relation Age of Onset  . Diabetes Mother   . Stroke Mother   . Hypertension Father   . Stroke Father   . Hyperlipidemia Sister   . Hypertension Sister   . Aneurysm Sister   . Hyperlipidemia Brother   . Hypertension Brother          Social History:  reports that she has been smoking cigarettes. She has a 20.00 pack-year smoking history. She has never used smokeless tobacco. She reports that she does not drink alcohol or use drugs.  Allergies:  Allergies  Allergen Reactions  . Keflex [Cephalexin] Shortness Of Breath and Other (See Comments)    Chest pain, headache  . Lipitor [Atorvastatin] Shortness Of Breath and Other (See Comments)    Severe pain/cramping in legs- "I cannot walk, talk, or breathe"  . Codeine Other (See Comments)    Disrupted patient's equilibrium- "Made my world flip upside down"  . Hydrochlorothiazide     Other reaction(s): lightheaded, nausea  . Hydrocodone-Acetaminophen     Other reaction(s): "get crazy"  . Metformin Hcl     Other reaction(s): upset stomach  . Tramadol Hcl     Other reaction(s): "get crazy"  . Azor [Amlodipine-Olmesartan] Swelling, Palpitations and Rash    Amlodipine alone resumed 12/2013, tolerating  . Lisinopril Cough  . Penicillins Dermatitis and Other (See Comments)    Did it involve swelling of the face/tongue/throat, SOB, or low BP? No, just worsened eczema Did it involve sudden or severe rash/hives, skin peeling, or any reaction on the inside of your mouth or nose? No Did you need to seek medical attention at a hospital or doctor's office? No When did it last happen? "More than 10 years ago" If all above answers are "NO", may  proceed with cephalosporin use.     Medications:                                                                                                                           Current Facility-Administered Medications  Medication Dose Route Frequency Provider Last Rate Last Admin  . promethazine (PHENERGAN) injection 12.5 mg  12.5 mg Intravenous Q6H PRN Elam Dutch, MD       Current Outpatient Medications  Medication Sig Dispense Refill  . acetaminophen (TYLENOL) 650 MG CR tablet Take 650 mg by mouth daily.    Marland Kitchen albuterol (PROVENTIL HFA;VENTOLIN HFA) 108 (90 Base) MCG/ACT inhaler Inhale 2 puffs into the lungs every 4 (four) hours as needed for wheezing or shortness of breath (or coughing). 1 Inhaler 0  . amLODipine (NORVASC) 10 MG tablet Take 10 mg by mouth daily.     Marland Kitchen aspirin EC 81 MG tablet Take 1 tablet (81 mg total) by mouth daily. 150 tablet 2  . atorvastatin (LIPITOR) 20 MG tablet Take 20 mg by mouth daily.    . clopidogrel (PLAVIX) 75 MG tablet Take 75 mg by mouth daily.    Marland Kitchen esomeprazole (NEXIUM) 40 MG capsule Take 40 mg by mouth daily.     . isosorbide mononitrate (IMDUR) 60 MG 24 hr tablet Take 60 mg by mouth daily.  3  . metoprolol succinate (TOPROL-XL) 100 MG 24 hr tablet Take 100 mg by mouth daily.    . nitroGLYCERIN (NITROSTAT) 0.4 MG SL tablet Place 0.4 mg under the tongue every 5 (five) minutes as needed for chest pain.    Vladimir Faster Glycol-Propyl Glycol (LUBRICANT EYE DROPS) 0.4-0.3 % SOLN Place 1 drop into both eyes 3 (three) times daily as needed (dry/irritated eyes.). [employer] Advanced Eye Relief Dry Eye Lubricant Eye Drops       ROS:                                                                                                                                   ROS was performed and is negative except as noted in HPI    General Examination:  Blood pressure (!) 195/82, pulse (!) 43, temperature 98.1 F (36.7 C), temperature  source Oral, resp. rate 16, height 5' 7.5" (1.715 m), weight 77.1 kg, SpO2 96 %.  Physical Exam  Constitutional: Appears well-developed and well-nourished.  Psych: Affect appropriate to situation Eyes: Normal external eye and conjunctiva. HENT: Normocephalic, no lesions, without obvious abnormality.   Musculoskeletal-no joint tenderness, deformity or swelling Cardiovascular: Normal rate and regular rhythm.  Respiratory: Effort normal, non-labored breathing saturations WNL GI: Soft.  No distension. There is no tenderness.  Skin: WDI  Neurological Examination Mental Status: Alert, oriented, thought content appropriate.  Speech fluent without evidence of aphasia.  Able to follow commands without difficulty. Cranial Nerves: II: Visual fields grossly normal,  III,IV, VI: ptosis not present, extra-ocular motions intact bilaterally, pupils equal, round, reactive to light and accommodation V,VII: smile asymmetric, slight left facial droop, facial light touch sensation normal bilaterally VIII: hearing normal bilaterally IX,X: uvula rises midline XI: bilateral shoulder shrug XII: midline tongue extension Motor: Right : Upper extremity   5/5 Left:     Upper extremity   4/5  Lower extremity   5/5  Lower extremity   4/5 Tone and bulk:normal tone throughout; no atrophy noted Sensory:  light touch intact throughout, bilaterally Deep Tendon Reflexes: 2+ and symmetric biceps and patella Cerebellar: No ataxia Gait: deferred   Lab Results: Basic Metabolic Panel: Recent Labs  Lab 10/11/19 1048  NA 136  K 4.7  CL 101  CO2 25  GLUCOSE 374*  BUN 14  CREATININE 1.16*  CALCIUM 9.6    CBC: Recent Labs  Lab 10/11/19 1048  WBC 6.4  NEUTROABS 4.0  HGB 13.6  HCT 42.3  MCV 89.8  PLT 166    CBG: Recent Labs  Lab 10/11/19 1130  GLUCAP 344*    Imaging: CT HEAD WO CONTRAST  Result Date: 10/11/2019 CLINICAL DATA:  Pt arrives via [facility] from home for c/o L sided weakness, hx of cva  with L side deficits (facial droop, grip strength weak and L leg drags). Pt and family state they think L side is weaker than at baseline. LSN 6pm yesterday by family, pt reports she went to bed around 830 last night, pt states she woke up this morning feeling weaker on L side than usua EXAM: CT HEAD WITHOUT CONTRAST TECHNIQUE: Contiguous axial images were obtained from the base of the skull through the vertex without intravenous contrast. COMPARISON:  04/07/2019 FINDINGS: Brain: No evidence of acute infarction, hemorrhage, hydrocephalus, extra-axial collection or mass lesion/mass effect. Old infarct extending from the right basal ganglia to the deep right white matter. Small old right thalamic lacunar infarct. Patchy white matter hypoattenuation most evident in the right frontal lobe consistent with chronic ischemic change. These findings are stable. Vascular: No hyperdense vessel or unexpected calcification. Skull: Normal. Negative for fracture or focal lesion. Sinuses/Orbits: Visualized globes and orbits are unremarkable. The visualized sinuses and mastoid air cells are clear. Other: None. IMPRESSION: 1. No acute intracranial abnormalities. No change from the prior study. Electronically Signed   By: Lajean Manes M.D.   On: 10/11/2019 11:19   MR BRAIN WO CONTRAST  Result Date: 10/11/2019 CLINICAL DATA:  Left-sided weakness affecting the arm and leg. Negative acute head CT. EXAM: MRI HEAD WITHOUT CONTRAST TECHNIQUE: Multiplanar, multiecho pulse sequences of the brain and surrounding structures were obtained without intravenous contrast. COMPARISON:  Head CT earlier same day.  MRI 02/18/2015. FINDINGS: Brain: Diffusion imaging shows a 3-4 mm acute infarction in the posterior  limb internal capsule on the right. No other acute infarction. There are chronic small-vessel ischemic changes of the pons. No focal cerebellar infarction. Cerebral hemispheres show extensive chronic small-vessel ischemic changes throughout  the white matter, with a larger more confluent subcortical white matter infarction in the right frontoparietal region. Old small vessel infarctions of the basal ganglia and radiating white matter tracts, right more than left. Some hemosiderin deposition in that area. No mass, recent hemorrhage, hydrocephalus or extra-axial collection. Vascular: Major vessels at the base of the brain show flow. Skull and upper cervical spine: Negative Sinuses/Orbits: Clear/normal.  Previous lens implant on the right Other: None IMPRESSION: 3-4 mm acute infarction in the posterior limb internal capsule on the right. Extensive old ischemic changes elsewhere throughout the brain as outlined above. Electronically Signed   By: Nelson Chimes M.D.   On: 10/11/2019 12:59       Laurey Morale, MSN, NP-C [facility] [phone]  10/11/2019, 1:58 PM   Attending physician note to follow with Assessment and plan .  I have seen the patient and reviewed the above note.  Assessment: 79 y.o. 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 CTH no hemorrhage. MRI showed a right IC infarct.  Patient was not a candidate for TPA d/t presenting outside the window.  I suspect that this represents small vessel ischemia due to the risk factors listed below.  She will need risk factor modification.  Stroke Risk Factors - diabetes mellitus, hyperlipidemia, hypertension and smoking    Recommendations: -- BP goal : Permissive HTN upto 220/110 mmHg (for 24-48 post admission )  --CTA  Head and neck --Echocardiogram -- Prophylactic therapy-ASA and plavix x 3 weeks.  -- High intensity Statin if LDLd> 70 -- HgbA1c, fasting lipid panel -- PT consult, OT consult, Speech consult --Telemetry monitoring --Frequent neuro checks --Stroke swallow screen  -- smoking cessation  --Please page the Stroke team from 8am-4pm.    You can look them up on [email or web address]    Roland Rack, MD [facility] [phone]  If 7pm- 7am, please page neurology on call as listed in [place].

## 2019-10-11 NOTE — ED Notes (Signed)
Patient transported to MRI 

## 2019-10-11 NOTE — ED Provider Notes (Signed)
[facility] EMERGENCY DEPARTMENT Provider Note   CSN: [id number] Arrival date & time: 10/11/19  1031     History Chief Complaint  Patient presents with  . Weakness    [name] is a 79 y.o. female with a history of MI, AAA, stroke with residual left-sided weakness, presenting to the ED with worsening of her chronic left-sided weakness.  Patient reports that she normally has some level of weakness with her grip in her left hand and also her left foot, which he states she walks with a drag foot.  However today she noted that her foot was weaker than normal and she needed to rely more heavily on her walker.  She also feels ago her left arm is heavier than normal.  Her speech is clear.  She does report significant mental stress in her life that she has lost both of her children recently.  She lives with the daughter of her now deceased son, the daughter being deaf.  They have no other assistance in the house.  She had been living independently until now.  She denies any significant headache.  Denies CP, fevers, chills, slurred speech.  MRI brain in Sept 2016 per our records showed acute infarct in the right corona raidata, mild bilateral ICA stenosis, moderate bilateral vertebral rtery stenosis, no flow-limiting stenosis, chronic bilateral basal ganglia lacunar infarcts.  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 Maximum diameter 15 mm, no change as compared with prior CTA. Common origin of the bilateral common carotid arteries. No acute dissection is seen. Similar appearance of focal outpouching at the distal underside of the arch measuring 8 x 6 mm. Extensive mural thickening throughout the descending thoracic aorta without dissection.    No intraabdominal  AAA.  HPI     Past Medical History:  Diagnosis Date  . AAA (abdominal aortic aneurysm) ([place])   . Aneurysm ([place])   . Arthritis    osteoarthritis  . Constipation   . Diabetes mellitus    Type 2, on Glimiperide  . GERD (gastroesophageal reflux disease)   . Glaucoma   . Hyperlipidemia   . Hypertension   . Kidney stones   . Myocardial infarction ([place])   . Peripheral vascular disease ([place])   . Pneumonia   . Stroke ([place]) 01/2015   with left side weakness   . TIA (transient ischemic attack)   . Urinary incontinence     Patient Active Problem List   Diagnosis Date Noted  . Hypertensive urgency 04/07/2019  . CKD (chronic kidney disease) stage 3, GFR 30-59 ml/min 04/07/2019  . Chest pain 04/28/2017  . History of ST elevation myocardial infarction (STEMI) 04/28/2017  . Grief reaction 04/28/2017  . Osteoarthritis of left hip 05/18/2015  . Status post total replacement of left hip 05/18/2015  . Coronary artery disease involving native coronary artery of native heart with angina pectoris ([place]) 04/16/2015  . History of stroke 04/16/2015  . Type 2 diabetes mellitus with circulatory disorder ([place]) 04/16/2015  . Cerebrovascular 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 artery ([place]) 04/16/2015  . HLD (hyperlipidemia)   . Acute CVA (cerebrovascular accident) ([place]) 02/19/2015  . Transient ischemic attack (TIA) 02/18/2015  . Stroke ([place])   . Cerebral infarction due to thrombosis of right middle cerebral artery ([place])   . Acute myocardial infarction  of inferolateral wall ([place]) 04/12/2014  . Numbness 03/18/2014  . TIA (transient ischemic attack) 03/18/2014  . DM (diabetes mellitus) type II uncontrolled, periph vascular disorder ([place]) 03/18/2014  . Hyperlipidemia   . Hypertension   . PVD (peripheral vascular disease) ([place]) 03/05/2014  . Transient cerebral ischemia 08/01/2012  . Atherosclerosis of native arteries of extremity with intermittent claudication ([place]) 10/19/2011   . Unspecified disorders of arteries and arterioles 10/19/2011  . PAD (peripheral artery disease) ([place]) 04/13/2011    Past Surgical History:  Procedure Laterality Date  . ABDOMINAL AORTAGRAM N/A 03/13/2014   Procedure: ABDOMINAL Maxcine Ham;  Surgeon: Elam Dutch, MD;  Location: [facility] CATH LAB;  Service: Cardiovascular;  Laterality: N/A;  . ABDOMINAL AORTIC ANEURYSM REPAIR    . ABDOMINAL AORTIC ANEURYSM REPAIR  ?2012  . ABDOMINAL HYSTERECTOMY     partial  . BACK SURGERY    . CARDIAC CATHETERIZATION    . CORONARY ANGIOPLASTY  03/2015  . EYE SURGERY Right    laser surgery for blood behind eye, loss of sight  . FEMORAL-POPLITEAL BYPASS GRAFT Left 07/06/2014   Procedure: BYPASS GRAFT FEMORAL-POPLITEAL ARTERY;  Surgeon: Elam Dutch, MD;  Location: [facility];  Service: Vascular;  Laterality: Left;  . HERNIA REPAIR    . JOINT REPLACEMENT    . LEFT HEART CATHETERIZATION WITH CORONARY ANGIOGRAM N/A 04/12/2014   Procedure: LEFT HEART CATHETERIZATION WITH CORONARY ANGIOGRAM;  Surgeon: Clent Demark, MD;  Location: [facility] CATH LAB;  Service: Cardiovascular;  Laterality: N/A;  . PERCUTANEOUS CORONARY STENT INTERVENTION (PCI-S)  04/12/2014   Procedure: PERCUTANEOUS CORONARY STENT INTERVENTION (PCI-S);  Surgeon: Clent Demark, MD;  Location[facility] CATH LAB;  Service: Cardiovascular;;  prox and mid RCA  . PERIPHERAL VASCULAR INTERVENTION  06/25/2019   Procedure: PERIPHERAL VASCULAR INTERVENTION;  Surgeon: Elam Dutch, MD;  Location: [facility] CV LAB;  Service: Cardiovascular;;  Lt Renal and Rt Renal  . RENAL ANGIOGRAPHY Bilateral 06/25/2019   Procedure: RENAL ANGIOGRAPHY;  Surgeon: Elam Dutch, MD;  Location: [facility] CV LAB;  Service: Cardiovascular;  Laterality: Bilateral;  . TOTAL HIP ARTHROPLASTY Left 05/18/2015  . TOTAL HIP ARTHROPLASTY Left 05/18/2015   Procedure: LEFT TOTAL HIP ARTHROPLASTY ANTERIOR APPROACH;  Surgeon: Mcarthur Rossetti, MD;  Location: [facility];  Service:  Orthopedics;  Laterality: Left;  . TOTAL KNEE ARTHROPLASTY Left ~ 2003  . TUBAL LIGATION    . uterine tumor    . VENTRAL HERNIA REPAIR       OB History   No obstetric history on file.     Family History  Problem Relation Age of Onset  . Diabetes Mother   . Stroke Mother   . Hypertension Father   . Stroke Father   . Hyperlipidemia Sister   . Hypertension Sister   . Aneurysm Sister   . Hyperlipidemia Brother   . Hypertension Brother     Social History   Tobacco Use  . Smoking status: Current Every Day Smoker    Packs/day: 0.50    Years: 40.00    Pack years: 20.00    Types: Cigarettes  . Smokeless tobacco: Never Used  Substance Use Topics  . Alcohol use: No    Alcohol/week: 0.0 standard drinks  . Drug use: No    Home Medications Prior to Admission medications   Medication Sig Start Date End Date Taking? Authorizing Provider  acetaminophen (TYLENOL) 650 MG CR tablet Take 650 mg by mouth at bedtime as needed for pain.  Yes [provider]  albuterol (PROVENTIL HFA;VENTOLIN HFA) 108 (90 Base) MCG/ACT inhaler Inhale 2 puffs into the lungs every 4 (four) hours as needed for wheezing or shortness of breath (or coughing). 06/16/12  Yes Delora Fuel, MD  amLODipine (NORVASC) 5 MG tablet Take 5 mg by mouth daily. 09/01/19  Yes [provider]  aspirin EC 81 MG tablet Take 1 tablet (81 mg total) by mouth daily. Patient taking differently: Take 243 mg by mouth daily as needed (chest pain).  06/25/19  Yes Fields, Jessy Oto, MD  atorvastatin (LIPITOR) 20 MG tablet Take 20 mg by mouth daily. 06/22/19  Yes [provider]  clopidogrel (PLAVIX) 75 MG tablet Take 75 mg by mouth daily.   Yes [provider]  esomeprazole (NEXIUM) 40 MG capsule Take 40 mg by mouth daily as needed (acid reflux).    Yes [provider]  isosorbide mononitrate (IMDUR) 60 MG 24 hr tablet Take 60 mg by mouth daily. 02/24/15  Yes [provider]  metoprolol  succinate (TOPROL-XL) 100 MG 24 hr tablet Take 100 mg by mouth daily. 06/09/19  Yes [provider]  nitroGLYCERIN (NITROSTAT) 0.4 MG SL tablet Place 0.4 mg under the tongue every 5 (five) minutes as needed for chest pain.   Yes [provider]  Polyethyl Glycol-Propyl Glycol (LUBRICANT EYE DROPS) 0.4-0.3 % SOLN Place 1 drop into both eyes 3 (three) times daily as needed (dry/irritated eyes.). [employer] Advanced Eye Relief Dry Eye Lubricant Eye Drops   Yes [provider]  spironolactone (ALDACTONE) 25 MG tablet Take 25 mg by mouth daily. 08/04/19  Yes [provider]    Allergies    Keflex [cephalexin], Lipitor [atorvastatin], Codeine, Hydrochlorothiazide, Hydrocodone-acetaminophen, Metformin hcl, Tramadol hcl, Azor [amlodipine-olmesartan], Lisinopril, and Penicillins  Review of Systems   Review of Systems  Constitutional: Negative for chills and fever.  Eyes: Negative for pain and visual disturbance.  Respiratory: Negative for cough and shortness of breath.   Cardiovascular: Negative for chest pain and palpitations.  Gastrointestinal: Negative for abdominal pain and vomiting.  Genitourinary: Negative for dysuria and hematuria.  Musculoskeletal: Negative for arthralgias and back pain.  Skin: Negative for color change and rash.  Neurological: Positive for weakness. Negative for syncope.  All other systems reviewed and are negative.   Physical Exam Updated Vital Signs BP (!) 229/91 (BP Location: Right Arm)   Pulse 66   Temp 98.5 F (36.9 C) (Oral)   Resp 18   Ht 5' 7.5" (1.715 m)   Wt 77.1 kg   SpO2 96%   BMI 26.23 kg/m   Physical Exam Vitals and nursing note reviewed.  Constitutional:      General: She is not in acute distress.    Appearance: She is well-developed.  HENT:     Head: Normocephalic and atraumatic.  Eyes:     General: No visual field deficit.    Conjunctiva/sclera: Conjunctivae normal.  Cardiovascular:     Rate and  Rhythm: Normal rate and regular rhythm.     Pulses: Normal pulses.  Pulmonary:     Effort: Pulmonary effort is normal. No respiratory distress.     Breath sounds: Normal breath sounds.  Abdominal:     Palpations: Abdomen is soft.     Tenderness: There is no abdominal tenderness.  Musculoskeletal:     Cervical back: Neck supple.  Skin:    General: Skin is warm and dry.  Neurological:     Mental Status: She is alert  and oriented to person, place, and time.     GCS: GCS eye subscore is 4. GCS verbal subscore is 5. GCS motor subscore is 6.     Cranial Nerves: No dysarthria.     Sensory: Sensation is intact.     Comments: Left lower facial droop No numbness on exam 4/5 strength in left lower extremity, 3/5 strength in left upper extremity (including arm abduction, grip strength)  Psychiatric:        Mood and Affect: Mood normal.        Behavior: Behavior normal.     ED Results / Procedures / Treatments   Labs (all labs ordered are listed, but only abnormal results are displayed) Labs Reviewed  COMPREHENSIVE METABOLIC PANEL - Abnormal; Notable for the following components:      Result Value   Glucose, Bld 374 (*)    Creatinine, Ser 1.16 (*)    AST 12 (*)    GFR calc non Af Amer 44 (*)    GFR calc Af Amer 51 (*)    All other components within normal limits  CBG MONITORING, ED - Abnormal; Notable for the following components:   Glucose-Capillary 344 (*)    All other components within normal limits  I-STAT BETA HCG BLOOD, ED ([facility], [facility], [facility] ONLY) - Abnormal; Notable for the following components:   I-stat hCG, quantitative 10.0 (*)    All other components within normal limits  SARS CORONAVIRUS 2 (TAT 6-24 HRS)  PROTIME-INR  APTT  CBC  DIFFERENTIAL  I-STAT CHEM 8, ED  TROPONIN I (HIGH SENSITIVITY)    EKG EKG Interpretation  Date/Time:  Saturday Oct 11 2019 10:29:03 EDT Ventricular Rate:  63 PR Interval:  150 QRS Duration: 80 QT Interval:  440 QTC Calculation: 450 R  Axis:   46 Text Interpretation: Normal sinus rhythm Normal ECG No STEMI Confirmed by Octaviano Glow [phone]) on 10/11/2019 11:50:31 AM   Radiology CT HEAD WO CONTRAST  Result Date: 10/11/2019 CLINICAL DATA:  Pt arrives via [facility] from home for c/o L sided weakness, hx of cva with L side deficits (facial droop, grip strength weak and L leg drags). Pt and family state they think L side is weaker than at baseline. LSN 6pm yesterday by family, pt reports she went to bed around 830 last night, pt states she woke up this morning feeling weaker on L side than usua EXAM: CT HEAD WITHOUT CONTRAST TECHNIQUE: Contiguous axial images were obtained from the base of the skull through the vertex without intravenous contrast. COMPARISON:  04/07/2019 FINDINGS: Brain: No evidence of acute infarction, hemorrhage, hydrocephalus, extra-axial collection or mass lesion/mass effect. Old infarct extending from the right basal ganglia to the deep right white matter. Small old right thalamic lacunar infarct. Patchy white matter hypoattenuation most evident in the right frontal lobe consistent with chronic ischemic change. These findings are stable. Vascular: No hyperdense vessel or unexpected calcification. Skull: Normal. Negative for fracture or focal lesion. Sinuses/Orbits: Visualized globes and orbits are unremarkable. The visualized sinuses and mastoid air cells are clear. Other: None. IMPRESSION: 1. No acute intracranial abnormalities. No change from the prior study. Electronically Signed   By: Lajean Manes M.D.   On: 10/11/2019 11:19   MR BRAIN WO CONTRAST  Result Date: 10/11/2019 CLINICAL DATA:  Left-sided weakness affecting the arm and leg. Negative acute head CT. EXAM: MRI HEAD WITHOUT CONTRAST TECHNIQUE: Multiplanar, multiecho pulse sequences of the brain and surrounding structures were obtained without intravenous contrast. COMPARISON:  Head CT  earlier same day.  MRI 02/18/2015. FINDINGS: Brain: Diffusion imaging shows a  3-4 mm acute infarction in the posterior limb internal capsule on the right. No other acute infarction. There are chronic small-vessel ischemic changes of the pons. No focal cerebellar infarction. Cerebral hemispheres show extensive chronic small-vessel ischemic changes throughout the white matter, with a larger more confluent subcortical white matter infarction in the right frontoparietal region. Old small vessel infarctions of the basal ganglia and radiating white matter tracts, right more than left. Some hemosiderin deposition in that area. No mass, recent hemorrhage, hydrocephalus or extra-axial collection. Vascular: Major vessels at the base of the brain show flow. Skull and upper cervical spine: Negative Sinuses/Orbits: Clear/normal.  Previous lens implant on the right Other: None IMPRESSION: 3-4 mm acute infarction in the posterior limb internal capsule on the right. Extensive old ischemic changes elsewhere throughout the brain as outlined above. Electronically Signed   By: Nelson Chimes M.D.   On: 10/11/2019 12:59    Procedures Procedures (including critical care time)  Medications Ordered in ED Medications - No data to display  ED Course  I have reviewed the triage vital signs and the nursing notes.  Pertinent labs & imaging results that were available during my care of the patient were reviewed by me and considered in my medical decision making (see chart for details).  79 yo female here with concern for acute on chronic left sided weakness since yesterday evening.  She has a hx of stroke as noted in history above.  Difficult to determine what her actual baseline strength is following her prior stroke, aside from her subjective history to me of worsening weakness.  She does report significant stressors in her life with the loss of both of her sons, and I suspect there may be some recrudence of her chronic encephalomalacia.  CTH shows no acute findings but many chronic changes.  We'll proceed  for MRI.  No evidence of meningitis or encephalitis on exam.  She is mentally sharp.    No chest pain, SOB, to suggest this is related to her thoracic aneurysm.  Bloodwork reviewed personally, largely unremakrable.  Hyperglycemic with BS 374, no anion gap, no evidence of DKA.  CBC wnl.  Troponin pending (to evaluate for atypical CP symptoms).  ECG nonischemic per my interpretation, NSR.    Clinical Course as of Oct 11 1903  Sat Oct 11, 2019  1131 Beta hcg ordered at triage for this 79 year old female.  I am doubtful this is a true positive result.   [MT]  1312  IMPRESSION: 3-4 mm acute infarction in the posterior limb internal capsule on the right.  Extensive old ischemic changes elsewhere throughout the brain as outlined above.   [MT]  1314 Paged neurology   [MT]  1319 Spoke to dr Leonel Ramsay, they will be coming to evaluate patient   [MT]  1508 Pt willing to stay overnight for w/u.  Will admit to hospitalist   [MT]  1605 Signed out to hospitalist   [MT]    Clinical Course User Index [MT] Jess Sulak, Carola Rhine, MD    Final Clinical Impression(s) / ED Diagnoses Final diagnoses:  Cerebrovascular accident (CVA), unspecified mechanism [facility])    Rx / DC Orders ED Discharge Orders    None       Emmie Frakes, Carola Rhine, MD 10/11/19 1906

## 2019-10-11 NOTE — ED Triage Notes (Addendum)
Pt arrives via [facility] from home for c/o L sided weakness, hx of cva with L side deficits (facial droop, grip strength weak and L leg drags). Pt and family state they think L side is weaker than at baseline. LSN 6pm yesterday by family, pt reports she went to bed around 830 last night, pt states she woke up this morning feeling weaker on L side than usual. EMS VS 174/80, HR 64, RR 18, 100% on ra, Cbg 437. A/ox4, speech clear.

## 2019-10-11 NOTE — H&P (Signed)
History and Physical    [name] NWG:[id number] DOB: Aug 06, 1940 DOA: 10/11/2019  PCP: [name], MD (Confirm with patient/family/NH records and if not entered, this has to be entered at [facility] point of entry) Patient coming from: Home  I have personally briefly reviewed patient's old medical records in [place]  Chief Complaint: acute on chronic weakness of the left sided weakness since yesterday.    HPI: [name] is a 79 y.o. female with HTN, DM,  CAD with inferior STEMI s/p PCI to [facility] 03/2014, PVD s/p abdominal aneurysm repair-08/2005, Left Femoral-popliteal bypass surgery-07/06/2014, frequent TIAs- since 2002 and strokes in past has chronic speech and left sided weakness who presented to the ED with worsening left-sided weakness since a.m.  Patient reports that woke up to go to the bathroom this a.m. and started falling against a wall.  Also felt heavy in her left arm.  Called her daughter who advised her to call 911.  She denies any trouble speaking, swallowing.  Reports her blood pressure has been running high past 2 weeks.   Personal social history: She is currently widowed.  Lives with daughter-in-law who is hearing impaired.  She uses a walker on and off to ambulate. Active smoker 0.5 pack/day for 60 years.  She denies any EtOH use.  Denies any illicit drug use.  Used to be a Theatre manager.  Family history: 2 sons died of stomach cancers. One brother died of bone cancer.  Mother with diabetes, HTN.  Dad also had HTN.  ED Course:  Vitals with 174/74, heart rate in 50s to 60s.  -Head CT unremarkable -MRI with 3-4 mm acute infarction in the posterior limb internal capsule on the right.  -Labs with WBC 6, Hb 13.6, platelets 166. -Sodium 136, potassium 4.7, glucose 324, creatinine 1.16.-Troponin VII  Review of Systems: As per HPI otherwise 10 point review of systems negative.    Past Medical History:  Diagnosis Date  . AAA (abdominal aortic aneurysm)  ([place])   . Aneurysm ([place])   . Arthritis    osteoarthritis  . Constipation   . Diabetes mellitus    Type 2, on Glimiperide  . GERD (gastroesophageal reflux disease)   . Glaucoma   . Hyperlipidemia   . Hypertension   . Kidney stones   . Myocardial infarction ([place])   . Peripheral vascular disease ([place])   . Pneumonia   . Stroke ([place]) 01/2015   with left side weakness   . TIA (transient ischemic attack)   . Urinary incontinence     Past Surgical History:  Procedure Laterality Date  . ABDOMINAL AORTAGRAM N/A 03/13/2014   Procedure: ABDOMINAL Maxcine Ham;  Surgeon: Elam Dutch, MD;  Location[facility] C[facility] LAB;  Service: Cardiovascular;  Laterality: N/A;  . ABDOMINAL AORTIC ANEURYSM REPAIR    . ABDOMINAL AORTIC ANEURYSM REPAIR  ?2012  . ABDOMINAL HYSTERECTOMY     partial  . BACK SURGERY    . CARDIAC CATHETERIZATION    . CORONARY ANGIOPLASTY  03/2015  . EYE SURGERY Right    laser surgery for blood behind eye, loss of sight  . FEMORAL-POPLITEAL BYPASS GRAFT Left 07/06/2014   Procedure: BYPASS GRAFT FEMORAL-POPLITEAL ARTERY;  Surgeon: Elam Dutch, MD;  Location: [place];  Service: Vascular;  Laterality: Left;  . HERNIA REPAIR    . JOINT REPLACEMENT    . LEFT HEART CATHETERIZATION WITH CORONARY ANGIOGRAM N/A 04/12/2014   Procedure: LEFT HEART CATHETERIZATION WITH CORONARY ANGIOGRAM;  Surgeon: Clent Demark, MD;  Location: [place] CATH LAB;  Service: Cardiovascular;  Laterality: N/A;  . PERCUTANEOUS CORONARY STENT INTERVENTION (PCI-S)  04/12/2014   Procedure: PERCUTANEOUS CORONARY STENT INTERVENTION (PCI-S);  Surgeon: Clent Demark, MD;  Location[facility] LAB;  Service: Cardiovascular;;  prox and mid RCA  . PERIPHERAL VASCULAR INTERVENTION  06/25/2019   Procedure: PERIPHERAL VASCULAR INTERVENTION;  Surgeon: Elam Dutch, MD;  Location: [place] CV LAB;  Service: Cardiovascular;;  Lt Renal and Rt Renal  . RENAL ANGIOGRAPHY Bilateral 06/25/2019   Procedure: RENAL ANGIOGRAPHY;   Surgeon: Elam Dutch, MD;  Location: [place] CV LAB;  Service: Cardiovascular;  Laterality: Bilateral;  . TOTAL HIP ARTHROPLASTY Left 05/18/2015  . TOTAL HIP ARTHROPLASTY Left 05/18/2015   Procedure: LEFT TOTAL HIP ARTHROPLASTY ANTERIOR APPROACH;  Surgeon: Mcarthur Rossetti, MD;  Location: [place];  Service: Orthopedics;  Laterality: Left;  . TOTAL KNEE ARTHROPLASTY Left ~ 2003  . TUBAL LIGATION    . uterine tumor    . VENTRAL HERNIA REPAIR       reports that she has been smoking cigarettes. She has a 20.00 pack-year smoking history. She has never used smokeless tobacco. She reports that she does not drink alcohol or use drugs.  Allergies  Allergen Reactions  . Keflex [Cephalexin] Shortness Of Breath and Other (See Comments)    Chest pain, headache  . Lipitor [Atorvastatin] Shortness Of Breath and Other (See Comments)    Severe pain/cramping in legs- "I cannot walk, talk, or breathe"  . Codeine Other (See Comments)    Disrupted patient's equilibrium- "Made my world flip upside down"  . Hydrochlorothiazide     Other reaction(s): lightheaded, nausea  . Hydrocodone-Acetaminophen     Other reaction(s): "get crazy"  . Metformin Hcl     Other reaction(s): upset stomach  . Tramadol Hcl     Other reaction(s): "get crazy"  . Azor [Amlodipine-Olmesartan] Swelling, Palpitations and Rash    Amlodipine alone resumed 12/2013, tolerating  . Lisinopril Cough  . Penicillins Dermatitis and Other (See Comments)    Did it involve swelling of the face/tongue/throat, SOB, or low BP? No, just worsened eczema Did it involve sudden or severe rash/hives, skin peeling, or any reaction on the inside of your mouth or nose? No Did you need to seek medical attention at a hospital or doctor's office? No When did it last happen? "More than 10 years ago" If all above answers are "NO", may proceed with cephalosporin use.     Family History  Problem Relation Age of Onset  . Diabetes Mother   .  Stroke Mother   . Hypertension Father   . Stroke Father   . Hyperlipidemia Sister   . Hypertension Sister   . Aneurysm Sister   . Hyperlipidemia Brother   . Hypertension Brother      Prior to Admission medications   Medication Sig Start Date End Date Taking? Authorizing Provider  acetaminophen (TYLENOL) 650 MG CR tablet Take 650 mg by mouth daily.    [provider]  albuterol (PROVENTIL HFA;VENTOLIN HFA) 108 (90 Base) MCG/ACT inhaler Inhale 2 puffs into the lungs every 4 (four) hours as needed for wheezing or shortness of breath (or coughing). 08/24/49   Delora Fuel, MD  amLODipine (NORVASC) 10 MG tablet Take 10 mg by mouth daily.  05/20/19   [provider]  aspirin EC 81 MG tablet Take 1 tablet (81 mg total) by mouth daily. 06/25/19   Elam Dutch, MD  atorvastatin (LIPITOR) 20  MG tablet Take 20 mg by mouth daily. 06/22/19   [provider]  clopidogrel (PLAVIX) 75 MG tablet Take 75 mg by mouth daily.    [provider]  esomeprazole (NEXIUM) 40 MG capsule Take 40 mg by mouth daily.     [provider]  isosorbide mononitrate (IMDUR) 60 MG 24 hr tablet Take 60 mg by mouth daily. 02/24/15   [provider]  metoprolol succinate (TOPROL-XL) 100 MG 24 hr tablet Take 100 mg by mouth daily. 06/09/19   [provider]  nitroGLYCERIN (NITROSTAT) 0.4 MG SL tablet Place 0.4 mg under the tongue every 5 (five) minutes as needed for chest pain.    [provider]  Polyethyl Glycol-Propyl Glycol (LUBRICANT EYE DROPS) 0.4-0.3 % SOLN Place 1 drop into both eyes 3 (three) times daily as needed (dry/irritated eyes.). [employer] Advanced Eye Relief Dry Eye Lubricant Eye Drops    [provider]    Physical Exam: Vitals:   10/11/19 1037 10/11/19 1130 10/11/19 1315 10/11/19 1330  BP: (!) 174/74 (!) 134/98 (!) 178/80 (!) 195/82  Pulse: 65 (!) 57 (!) 58 (!) 43  Resp: 14 19 16 16   Temp: 98.1 F (36.7 C)     TempSrc:  Oral     SpO2: 100% 96% 98% 96%  Weight: 77.1 kg     Height: 5' 7.5" (1.715 m)       Constitutional: NAD, calm, comfortable Vitals:   10/11/19 1037 10/11/19 1130 10/11/19 1315 10/11/19 1330  BP: (!) 174/74 (!) 134/98 (!) 178/80 (!) 195/82  Pulse: 65 (!) 57 (!) 58 (!) 43  Resp: 14 19 16 16   Temp: 98.1 F (36.7 C)     TempSrc: Oral     SpO2: 100% 96% 98% 96%  Weight: 77.1 kg     Height: 5' 7.5" (1.715 m)      Physical Exam  Constitutional: She is oriented to person, place, and time. She appears well-developed and well-nourished. No distress.  HENT:  Head: Normocephalic and atraumatic.  Right Ear: External ear normal.  Left Ear: External ear normal.  Mouth/Throat: Oropharynx is clear and moist.  Eyes: Pupils are equal, round, and reactive to light. EOM are normal. Right eye exhibits no discharge. Left eye exhibits no discharge. No scleral icterus.  Neck: No JVD present.  Cardiovascular: Normal rate and regular rhythm. Exam reveals no friction rub.  No murmur heard. Pulmonary/Chest: Effort normal and breath sounds normal. No respiratory distress. She has no wheezes. She has no rales. She exhibits no tenderness.  Abdominal: Soft. Bowel sounds are normal. She exhibits no distension and no mass. There is no abdominal tenderness. There is no rebound and no guarding.  Musculoskeletal:        General: No deformity or edema. Normal range of motion.     Cervical back: Normal range of motion and neck supple.  Lymphadenopathy:    She has no cervical adenopathy.  Neurological: She is alert and oriented to person, place, and time. She displays abnormal reflex.  Tongue and uvula midline. Left upper extremity 4/5 in left lower extremity 3/5.  Skin: Skin is warm and dry. She is not diaphoretic.  Vitals reviewed.    Labs on Admission: I have personally reviewed following labs and imaging studies  CBC: Recent Labs  Lab 10/11/19 1048  WBC 6.4  NEUTROABS 4.0  HGB 13.6  HCT 42.3  MCV  89.8  PLT 010   Basic Metabolic Panel: Recent Labs  Lab 10/11/19  1048  NA 136  K 4.7  CL 101  CO2 25  GLUCOSE 374*  BUN 14  CREATININE 1.16*  CALCIUM 9.6   GFR: Estimated Creatinine Clearance: 41.8 mL/min (A) (by C-G formula based on SCr of 1.16 mg/dL (H)). Liver Function Tests: Recent Labs  Lab 10/11/19 1048  AST 12*  ALT 10  ALKPHOS 73  BILITOT 1.0  PROT 8.0  ALBUMIN 3.5   No results for input(s): LIPASE, AMYLASE in the last 168 hours. No results for input(s): AMMONIA in the last 168 hours. Coagulation Profile: Recent Labs  Lab 10/11/19 1048  INR 0.9   Cardiac Enzymes: No results for input(s): CKTOTAL, CKMB, CKMBINDEX, TROPONINI in the last 168 hours. BNP (last 3 results) No results for input(s): PROBNP in the last 8760 hours. HbA1C: No results for input(s): HGBA1C in the last 72 hours. CBG: Recent Labs  Lab 10/11/19 1130  GLUCAP 344*   Lipid Profile: No results for input(s): CHOL, HDL, LDLCALC, TRIG, CHOLHDL, LDLDIRECT in the last 72 hours. Thyroid Function Tests: No results for input(s): TSH, T4TOTAL, FREET4, T3FREE, THYROIDAB in the last 72 hours. Anemia Panel: No results for input(s): VITAMINB12, FOLATE, FERRITIN, TIBC, IRON, RETICCTPCT in the last 72 hours. Urine analysis:    Component Value Date/Time   COLORURINE RED (A) 09/03/2015 2312   APPEARANCEUR TURBID (A) 09/03/2015 2312   LABSPEC 1.011 09/03/2015 2312   PHURINE 5.5 09/03/2015 2312   GLUCOSEU NEGATIVE 09/03/2015 2312   HGBUR LARGE (A) 09/03/2015 2312   BILIRUBINUR NEGATIVE 09/03/2015 2312   KETONESUR 15 (A) 09/03/2015 2312   PROTEINUR 100 (A) 09/03/2015 2312   UROBILINOGEN 0.2 07/04/2014 0319   NITRITE NEGATIVE 09/03/2015 2312   LEUKOCYTESUR LARGE (A) 09/03/2015 2312    Radiological Exams on Admission: CT HEAD WO CONTRAST  Result Date: 10/11/2019 CLINICAL DATA:  Pt arrives via [facility] from home for c/o L sided weakness, hx of cva with L side deficits (facial droop, grip strength  weak and L leg drags). Pt and family state they think L side is weaker than at baseline. LSN 6pm yesterday by family, pt reports she went to bed around 830 last night, pt states she woke up this morning feeling weaker on L side than usua EXAM: CT HEAD WITHOUT CONTRAST TECHNIQUE: Contiguous axial images were obtained from the base of the skull through the vertex without intravenous contrast. COMPARISON:  04/07/2019 FINDINGS: Brain: No evidence of acute infarction, hemorrhage, hydrocephalus, extra-axial collection or mass lesion/mass effect. Old infarct extending from the right basal ganglia to the deep right white matter. Small old right thalamic lacunar infarct. Patchy white matter hypoattenuation most evident in the right frontal lobe consistent with chronic ischemic change. These findings are stable. Vascular: No hyperdense vessel or unexpected calcification. Skull: Normal. Negative for fracture or focal lesion. Sinuses/Orbits: Visualized globes and orbits are unremarkable. The visualized sinuses and mastoid air cells are clear. Other: None. IMPRESSION: 1. No acute intracranial abnormalities. No change from the prior study. Electronically Signed   By: Lajean Manes M.D.   On: 10/11/2019 11:19   MR BRAIN WO CONTRAST  Result Date: 10/11/2019 CLINICAL DATA:  Left-sided weakness affecting the arm and leg. Negative acute head CT. EXAM: MRI HEAD WITHOUT CONTRAST TECHNIQUE: Multiplanar, multiecho pulse sequences of the brain and surrounding structures were obtained without intravenous contrast. COMPARISON:  Head CT earlier same day.  MRI 02/18/2015. FINDINGS: Brain: Diffusion imaging shows a 3-4 mm acute infarction in the posterior limb internal capsule on the right. No other  acute infarction. There are chronic small-vessel ischemic changes of the pons. No focal cerebellar infarction. Cerebral hemispheres show extensive chronic small-vessel ischemic changes throughout the white matter, with a larger more confluent  subcortical white matter infarction in the right frontoparietal region. Old small vessel infarctions of the basal ganglia and radiating white matter tracts, right more than left. Some hemosiderin deposition in that area. No mass, recent hemorrhage, hydrocephalus or extra-axial collection. Vascular: Major vessels at the base of the brain show flow. Skull and upper cervical spine: Negative Sinuses/Orbits: Clear/normal.  Previous lens implant on the right Other: None IMPRESSION: 3-4 mm acute infarction in the posterior limb internal capsule on the right. Extensive old ischemic changes elsewhere throughout the brain as outlined above. Electronically Signed   By: Nelson Chimes M.D.   On: 10/11/2019 12:59    EKG: Independently reviewed.  CVP is per minute, normal axis, no ST-T wave changes, intervals within normal limits.  Assessment/Plan Active Problems:   CVA (cerebral vascular accident) ([place])  #RT MCA CVA per MRI  -Seen by neurology.  -F/u CTA neck.  -BP goal : Permissive HTN upto 220/110 mmHg (for 24-48 post admission )  --Echocardiogram -- Loaded with Plavix in ED. C/w ASA. --[place] -- PT consult, OT consult, Speech consult --Telemetry monitoring --Frequent neuro checks --Stroke swallow screen    #HTN/CAD -Continue Norvasc -Continue with Imdur -Continue with Toprol-XL  #Abnormal urinalysis  #DM -Holding glimepiride -Sliding scale insulin. -A1c pending  #GERD -Continue Nexium  DVT prophylaxis: Subcu heparin.    Code Status: Full code Family Communication: Spoke to patient alone.  Disposition Plan: Home PT versus rehab  Consults called: Neurology Admission status: Inpatient   Melba Araki MD [facility]   If 7PM-7AM, please contact night-coverage [email or web address] Password [facility]  10/11/2019, 3:47 PM

## 2019-10-12 ENCOUNTER — Inpatient Hospital Stay (HOSPITAL_COMMUNITY): Payer: Medicare HMO

## 2019-10-12 DIAGNOSIS — Z8673 Personal history of transient ischemic attack (TIA), and cerebral infarction without residual deficits: Secondary | ICD-10-CM

## 2019-10-12 DIAGNOSIS — I6389 Other cerebral infarction: Secondary | ICD-10-CM

## 2019-10-12 DIAGNOSIS — E1151 Type 2 diabetes mellitus with diabetic peripheral angiopathy without gangrene: Secondary | ICD-10-CM

## 2019-10-12 DIAGNOSIS — E1165 Type 2 diabetes mellitus with hyperglycemia: Secondary | ICD-10-CM

## 2019-10-12 DIAGNOSIS — I63311 Cerebral infarction due to thrombosis of right middle cerebral artery: Secondary | ICD-10-CM

## 2019-10-12 LAB — BASIC METABOLIC PANEL
Anion gap: 13 (ref 5–15)
BUN: 15 mg/dL (ref 8–23)
CO2: 22 mmol/L (ref 22–32)
Calcium: 9.4 mg/dL (ref 8.9–10.3)
Chloride: 98 mmol/L (ref 98–111)
Creatinine, Ser: 1.21 mg/dL — ABNORMAL HIGH (ref 0.44–1.00)
GFR calc Af Amer: 49 mL/min — ABNORMAL LOW (ref >60)
GFR calc non Af Amer: 42 mL/min — ABNORMAL LOW (ref >60)
Glucose, Bld: 428 mg/dL — ABNORMAL HIGH (ref 70–99)
Potassium: 3.7 mmol/L (ref 3.5–5.1)
Sodium: 133 mmol/L — ABNORMAL LOW (ref 135–145)

## 2019-10-12 LAB — COMPREHENSIVE METABOLIC PANEL
ALT: 11 U/L (ref 0–44)
AST: 11 U/L — ABNORMAL LOW (ref 15–41)
Albumin: 3.3 g/dL — ABNORMAL LOW (ref 3.5–5.0)
Alkaline Phosphatase: 66 U/L (ref 38–126)
Anion gap: 11 (ref 5–15)
BUN: 16 mg/dL (ref 8–23)
CO2: 24 mmol/L (ref 22–32)
Calcium: 9.6 mg/dL (ref 8.9–10.3)
Chloride: 100 mmol/L (ref 98–111)
Creatinine, Ser: 1.17 mg/dL — ABNORMAL HIGH (ref 0.44–1.00)
GFR calc Af Amer: 51 mL/min — ABNORMAL LOW (ref >60)
GFR calc non Af Amer: 44 mL/min — ABNORMAL LOW (ref >60)
Glucose, Bld: 359 mg/dL — ABNORMAL HIGH (ref 70–99)
Potassium: 4.2 mmol/L (ref 3.5–5.1)
Sodium: 135 mmol/L (ref 135–145)
Total Bilirubin: 0.7 mg/dL (ref 0.3–1.2)
Total Protein: 7.5 g/dL (ref 6.5–8.1)

## 2019-10-12 LAB — CBC
HCT: 39.9 % (ref 36.0–46.0)
Hemoglobin: 13 g/dL (ref 12.0–15.0)
MCH: 29 pg (ref 26.0–34.0)
MCHC: 32.6 g/dL (ref 30.0–36.0)
MCV: 88.9 fL (ref 80.0–100.0)
Platelets: 157 10*3/uL (ref 150–400)
RBC: 4.49 MIL/uL (ref 3.87–5.11)
RDW: 14.5 % (ref 11.5–15.5)
WBC: 7.3 10*3/uL (ref 4.0–10.5)
nRBC: 0 % (ref 0.0–0.2)

## 2019-10-12 LAB — GLUCOSE, CAPILLARY
Glucose-Capillary: 268 mg/dL — ABNORMAL HIGH (ref 70–99)
Glucose-Capillary: 192 mg/dL — ABNORMAL HIGH (ref 70–99)

## 2019-10-12 LAB — ECHOCARDIOGRAM COMPLETE
Height: 67.5 in
Weight: 2720 oz

## 2019-10-12 LAB — CBG MONITORING, ED
Glucose-Capillary: 339 mg/dL — ABNORMAL HIGH (ref 70–99)
Glucose-Capillary: 351 mg/dL — ABNORMAL HIGH (ref 70–99)
Glucose-Capillary: 140 mg/dL — ABNORMAL HIGH (ref 70–99)

## 2019-10-12 LAB — HEMOGLOBIN A1C
Hgb A1c MFr Bld: 12 % — ABNORMAL HIGH (ref 4.8–5.6)
Mean Plasma Glucose: 297.7 mg/dL

## 2019-10-12 MED ORDER — INSULIN GLARGINE 100 UNIT/ML ~~LOC~~ SOLN
10.0000 [IU] | Freq: Every day | SUBCUTANEOUS | Status: DC
  Administered 2019-10-12 – 2019-10-13 (×2): 10 [IU] via SUBCUTANEOUS
  Filled 2019-10-12 (×2): qty 0.1

## 2019-10-12 MED ORDER — ATORVASTATIN CALCIUM 40 MG PO TABS
40.0000 mg | ORAL_TABLET | Freq: Every day | ORAL | Status: DC
  Administered 2019-10-12 – 2019-10-15 (×4): 40 mg via ORAL
  Filled 2019-10-12 (×4): qty 1

## 2019-10-12 MED ORDER — NICOTINE 14 MG/24HR TD PT24
14.0000 mg | MEDICATED_PATCH | Freq: Every day | TRANSDERMAL | Status: DC
  Administered 2019-10-12 – 2019-10-15 (×4): 14 mg via TRANSDERMAL
  Filled 2019-10-12 (×4): qty 1

## 2019-10-12 NOTE — ED Notes (Signed)
Breakfast Ordered 

## 2019-10-12 NOTE — Care Management (Signed)
[facility] can take patient but can not provide services for 7-10 days. Will look for other  agencies

## 2019-10-12 NOTE — Progress Notes (Signed)
Obtained report from ED nurse Luellen Pucker

## 2019-10-12 NOTE — Progress Notes (Signed)
*  PRELIMINARY RESULTS* Echocardiogram 2D Echocardiogram has been performed.  [name] 10/12/2019, 2:29 PM

## 2019-10-12 NOTE — ED Notes (Signed)
Pt belligerent and using racial slurs to staff.  Pt demanding to go home now. ADM MD notified of Pt demand . MD reported he would see her next . Pt on phone with Daughter reporting how much dust was in room .

## 2019-10-12 NOTE — ED Notes (Signed)
Spoke with daughter on phone about Pt's request to go home . Daughter reports she will be here at 1400.  ADM Dr in room and wants to be notified when Daughter arrives.

## 2019-10-12 NOTE — TOC Initial Note (Signed)
Transition of Care [facility]) - Initial/Assessment Note    Patient Details  Name: [name] MRN: [id number] Date of Birth: 08/20/1940  Transition of Care [facility]) CM/SW Contact:    Verdell Carmine, RN Phone Number: 10/12/2019, 2:36 PM  Clinical Narrative:                 Patient seen initially as she was stating she was leaving. States she had a terrible night waiting for a bed to open up, the room stayed dirty, she didn't get any sleep.  I apologized to her for the perception of the staff not seeming to care about the dirty room, they did clean it after the daughter called early this morning. Her daughter was in the room and encouraged her to stay another night. The things she wanted were 1. To get up to a room upstairs 2. To get a nicotine patch, I spoke to the [facility] to get a room, called the Doctor and he ordered the patch.  She was calmer when I left the room  and ensured her that I would work on getting home health coordinated.. The daughter took me aside as I exited and stated that some of her belligerent behavior is due to the absence of cigarettes, as well as her son dying a few months ago.  The daughter dod prod her mother to stay another night by saying that care coordination is difficult to get services on Sunday. I stated that I could get [facility] agency representatives on Sunday to set up services, but she just kept talking over me.  PT has already evaluated the patient and deemed the needing SNF but patient refused. Last HH was 2016  Ans patient had [facility]. Reached out to [facility] to see if they could take Ms [place].  Expected Discharge Plan: [place] Barriers to Discharge: Continued Medical Work up   Patient Goals and CMS Choice Patient states their goals for this hospitalization and ongoing recovery are:: Go home tomorrow ( patient already tried to sign out AMA)      Expected Discharge Plan and Services Expected Discharge Plan: [place]   Discharge  Planning Services: CM Consult Post Acute Care Choice: [place] arrangements for the past 2 months: Single Family Home                     Date DME Agency Contacted: (Will need ordered tomorrow when room number known 3:1)     HH Arranged: OT, PT, Nurse's Aide          Prior Living Arrangements/Services Living arrangements for the past 2 months: Single Family Home Lives with:: Self Patient language and need for interpreter reviewed:: Yes        Need for Family Participation in Patient Care: Yes (Comment) Care giver support system in place?: Yes (comment) Current home services: (Has [facility] will need 3:1) Criminal Activity/Legal Involvement Pertinent to Current Situation/Hospitalization: No - Comment as needed  Activities of Daily Living      Permission Sought/Granted   Permission granted to share information with : Yes, Verbal Permission Granted  Share Information with NAME: Daughter  Permission granted to share info w AGENCY: Home ehalth agencies        Emotional Assessment Appearance:: Appears younger than stated age Attitude/Demeanor/Rapport: Complaining Affect (typically observed): Blunt Orientation: : Oriented to Self, Oriented to Place, Oriented to  Time, Oriented to Situation Alcohol / Substance Use: Tobacco Use(current tobacco user)  Psych Involvement: No (comment)  Admission diagnosis:  CVA (cerebral vascular accident) [facility]) [I63.9] Patient Active Problem List   Diagnosis Date Noted  . CVA (cerebral vascular accident) ([place]) 10/11/2019  . Hypertensive urgency 04/07/2019  . CKD (chronic kidney disease) stage 3, GFR 30-59 ml/min 04/07/2019  . Chest pain 04/28/2017  . History of ST elevation myocardial infarction (STEMI) 04/28/2017  . Grief reaction 04/28/2017  . Osteoarthritis of left hip 05/18/2015  . Status post total replacement of left hip 05/18/2015  . Coronary artery disease involving native coronary artery of native heart with angina pectoris  ([place]) 04/16/2015  . History of stroke 04/16/2015  . Type 2 diabetes mellitus with circulatory disorder ([place]) 04/16/2015  . Cerebrovascular 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 artery ([place]) 04/16/2015  . HLD (hyperlipidemia)   . Acute CVA (cerebrovascular accident) ([place]) 02/19/2015  . Transient ischemic attack (TIA) 02/18/2015  . Stroke ([place])   . Cerebral infarction due to thrombosis of right middle cerebral artery ([place])   . Acute myocardial infarction of inferolateral wall (HCC) 04/12/2014  . Numbness 03/18/2014  . TIA (transient ischemic attack) 03/18/2014  . DM (diabetes mellitus) type II uncontrolled, periph vascular disorder ([place]) 03/18/2014  . Hyperlipidemia   . Hypertension   . PVD (peripheral vascular disease) ([place]) 03/05/2014  . Transient cerebral ischemia 08/01/2012  . Atherosclerosis of native arteries of extremity with intermittent claudication ([place]) 10/19/2011  . Unspecified disorders of arteries and arterioles 10/19/2011  . PAD (peripheral artery disease) ([place]) 04/13/2011   PCP:  [name], MD Pharmacy:   [facility] [place], [place] [address] Phone: [phone] Fax: [phone]     Social Determinants of Health (SDOH) Interventions    Readmission Risk Interventions No flowsheet data found.

## 2019-10-12 NOTE — Evaluation (Signed)
Speech Language Pathology Evaluation Patient Details Name: [name] MRN: [id number] DOB: [birth year] Today's Date: 10/12/2019 Time: 7824-2353 SLP Time Calculation (min) (ACUTE ONLY): 30 min  Problem List:  Patient Active Problem List   Diagnosis Date Noted  . CVA (cerebral vascular accident) ([place]) 10/11/2019  . Hypertensive urgency 04/07/2019  . CKD (chronic kidney disease) stage 3, GFR 30-59 ml/min 04/07/2019  . Chest pain 04/28/2017  . History of ST elevation myocardial infarction (STEMI) 04/28/2017  . Grief reaction 04/28/2017  . Osteoarthritis of left hip 05/18/2015  . Status post total replacement of left hip 05/18/2015  . Coronary artery disease involving native coronary artery of native heart with angina pectoris ([place]) 04/16/2015  . History of stroke 04/16/2015  . Type 2 diabetes mellitus with circulatory disorder ([place]) 04/16/2015  . Cerebrovascular 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 artery ([place]) 04/16/2015  . HLD (hyperlipidemia)   . Acute CVA (cerebrovascular accident) ([place]) 02/19/2015  . Transient ischemic attack (TIA) 02/18/2015  . Stroke ([place])   . Cerebral infarction due to thrombosis of right middle cerebral artery ([place])   . Acute myocardial infarction of inferolateral wall (HCC) 04/12/2014  . Numbness 03/18/2014  . TIA (transient ischemic attack) 03/18/2014  . DM (diabetes mellitus) type II uncontrolled, periph vascular disorder ([place]) 03/18/2014  . Hyperlipidemia   . Hypertension   . PVD (peripheral vascular disease) ([place]) 03/05/2014  . Transient cerebral ischemia 08/01/2012  . Atherosclerosis of native arteries of extremity with intermittent claudication ([place]) 10/19/2011  . Unspecified disorders of arteries and arterioles 10/19/2011  . PAD (peripheral artery disease) ([place]) 04/13/2011   Past Medical History:  Past Medical History:  Diagnosis Date  . AAA (abdominal aortic aneurysm) ([place])   . Aneurysm ([place])   . Arthritis    osteoarthritis  . Constipation   . Diabetes mellitus    Type 2, on Glimiperide  . GERD (gastroesophageal reflux disease)   . Glaucoma   . Hyperlipidemia   . Hypertension   . Kidney stones   . Myocardial infarction ([place])   . Peripheral vascular disease ([place])   . Pneumonia   . Stroke ([place]) 01/2015   with left side weakness   . TIA (transient ischemic attack)   . Urinary incontinence    Past Surgical History:  Past Surgical History:  Procedure Laterality Date  . ABDOMINAL AORTAGRAM N/A 03/13/2014   Procedure: ABDOMINAL Maxcine Ham;  Surgeon: Elam Dutch, MD;  Location: [facility] CATH LAB;  Service: Cardiovascular;  Laterality: N/A;  . ABDOMINAL AORTIC ANEURYSM REPAIR    . ABDOMINAL AORTIC ANEURYSM REPAIR  ?2012  . ABDOMINAL HYSTERECTOMY     partial  . BACK SURGERY    . CARDIAC CATHETERIZATION    . CORONARY ANGIOPLASTY  03/2015  . EYE SURGERY Right    laser surgery for blood behind eye, loss of sight  . FEMORAL-POPLITEAL BYPASS GRAFT Left 07/06/2014   Procedure: BYPASS GRAFT FEMORAL-POPLITEAL ARTERY;  Surgeon: Elam Dutch, MD;  Location: [facility];  Service: Vascular;  Laterality: Left;  . HERNIA REPAIR    . JOINT REPLACEMENT    . LEFT HEART CATHETERIZATION WITH CORONARY ANGIOGRAM N/A 04/12/2014   Procedure: LEFT HEART CATHETERIZATION WITH CORONARY ANGIOGRAM;  Surgeon: Clent Demark, MD;  Location: [facility] CATH LAB;  Service: Cardiovascular;  Laterality: N/A;  . PERCUTANEOUS CORONARY STENT INTERVENTION (PCI-S)  04/12/2014   Procedure: PERCUTANEOUS CORONARY STENT INTERVENTION (PCI-S);  Surgeon: Clent Demark, MD;  Location: [facility] CATH LAB;  Service: Cardiovascular;;  prox and  mid RCA  . PERIPHERAL VASCULAR INTERVENTION  06/25/2019   Procedure: PERIPHERAL VASCULAR INTERVENTION;  Surgeon: Elam Dutch, MD;  Location: [facility] CV LAB;  Service: Cardiovascular;;  Lt Renal and Rt Renal  . RENAL ANGIOGRAPHY Bilateral 06/25/2019   Procedure: RENAL ANGIOGRAPHY;  Surgeon: Elam Dutch,  MD;  Location: [facility] CV LAB;  Service: Cardiovascular;  Laterality: Bilateral;  . TOTAL HIP ARTHROPLASTY Left 05/18/2015  . TOTAL HIP ARTHROPLASTY Left 05/18/2015   Procedure: LEFT TOTAL HIP ARTHROPLASTY ANTERIOR APPROACH;  Surgeon: Mcarthur Rossetti, MD;  Location: [facility];  Service: Orthopedics;  Laterality: Left;  . TOTAL KNEE ARTHROPLASTY Left ~ 2003  . TUBAL LIGATION    . uterine tumor    . VENTRAL HERNIA REPAIR     HPI:  [name] is a 79 y.o. female with HTN, DM,  CAD with inferior STEMI s/p PCI to [facility] 03/2014, PVD s/p abdominal aneurysm repair-08/2005, Left Femoral-popliteal bypass surgery-07/06/2014, frequent TIAs- since 2002 and strokes in past and left sided weakness who presented to the ED with worsening left-sided weakness since a.m.  Patient reports that woke up to go to the bathroom this a.m. and started falling against a wall.  Also felt heavy in her left arm.  Called her daughter who advised her to call 911.  She denies any trouble speaking, swallowing.  Reports her blood pressure has been running high past 2 weeks.  MRI of the head was showing 3-88m acute infarct in the posterior lims of the internal capsule on the right as well as extensive old ischemic changes elsewhere throughout the brain.  The patient reported that she lives with her 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 whom she cares for following a severe CVA.  She was driving and managing medication and bill paying prior to admission.  She was last seen at [facility] by [place] 02/19/2015.  At that time she was noted to have mild flaccid dysarthria.    Assessment / Plan / Recommendation Clinical Impression  Cognitive/linguistic evaluation and motor speech screen were completed.  Cranial nerve exam was completed and unremarkable.  Lingual, labial, facial and jaw range of motion and strength appeared to be adequate.  Facial sensation appeared to be intact and she did not describe a difference in sensation from the left to right side of her  face.  Speech was clear and wasy to understand.  No discernible dysarthria or apraxia were noted.  She achieved a score of 25/30 on the Mini Mental State Exam suggesting functional skills.  She was fully oriented to person, place and situation.  She was partially oriented to time knowing the month, year and day of the week.  She was unable to accurately state the date.  Her immediate and delayed recall of three novel words was good.   Her attention to task appeared adequate.  Language skills appeared to be intact.  She was able to name objects, repeat a short sentence, follow 2 parts of a 3 step command, read/comprehend a sentence and write a sentence.  She struggled to accurately copy a Agricultural consultant.  She was able to provide logical solutions to simple problems.  During the clock drawing task her initial attempt was not accurate.  On the initial attempt her numbers were not in the correct location and when she went to place the hands on the clock she realized this and started over.  On her second attempt she was able to appropriately place the numbers on the clock face and place  the hands to the directed time.  Given results of this evaluation ST follow up is not indicated.  If we can be of further assistance please feel free to reconsult.       SLP Assessment  SLP Recommendation/Assessment: Patient does not need any further Speech [place] Pathology Services SLP Visit Diagnosis: Frontal lobe and executive function deficit Frontal lobe and executive function deficit following: Cerebral infarction    Follow Up Recommendations  None          SLP Evaluation Cognition  Overall Cognitive Status: Within Functional Limits for tasks assessed Arousal/Alertness: Awake/alert Orientation Level: Oriented X4 Attention: Sustained Sustained Attention: Appears intact Memory: Appears intact Awareness: Appears intact Problem Solving: Appears intact Safety/Judgment: Appears intact       Comprehension  Auditory  Comprehension Overall Auditory Comprehension: Appears within functional limits for tasks assessed Yes/No Questions: Not tested Commands: Within Functional Limits Conversation: Complex Reading Comprehension Reading Status: Within funtional limits    Expression Expression Primary Mode of Expression: Verbal Verbal Expression Overall Verbal Expression: Appears within functional limits for tasks assessed Initiation: No impairment Automatic Speech: Name;Social Response Level of Generative/Spontaneous Verbalization: Sentence;Conversation Repetition: No impairment Naming: No impairment Pragmatics: No impairment Non-Verbal Means of Communication: Not applicable Written Expression Dominant Hand: Right Written Expression: Within Functional Limits   Oral / Motor  Oral Motor/Sensory Function Overall Oral Motor/Sensory Function: Within functional limits Motor Speech Overall Motor Speech: Appears within functional limits for tasks assessed Respiration: Within functional limits Phonation: Normal Resonance: Within functional limits Articulation: Within functional limitis Intelligibility: Intelligible Motor Planning: Witnin functional limits Motor Speech Errors: Not applicable   GO                    Laura Flatten, MA, CCC-SLP Acute Rehab SLP [phone]  Lamar Sprinkles 10/12/2019, 11:44 AM

## 2019-10-12 NOTE — Evaluation (Signed)
Physical Therapy Evaluation Patient Details Name: [name] MRN: [id number] DOB: 1940-08-28 Today's Date: 10/12/2019   History of Present Illness  79 y.o. female with HTN, DM,  CAD with inferior STEMI s/p PCI to dRCA 03/2014, PVD s/p abdominal aneurysm repair-08/2005, Left Femoral-popliteal bypass surgery-07/06/2014, frequent TIAs- since 2002 and strokes in past has chronic speech and left sided weakness who presented to the ED with worsening left-sided weakness since a.m. 10/11/19. MRI with 3-4 mm acute infarction in the posterior limb internal capsule on the right. Admitted for treatment of CVA.   Clinical Impression  PTA pt living with and providing care for deaf daughter in-law who is s/p CVA and her grandson. Pt daughter works full time and provides some support for iADLs. Pt reports independence with mobility, only needing Rollator for ambulation on "bad days" and independent in ADLs, and iADLs, was driving to grocery store and church. Pt is currently limited in safe mobility by increased L-sided weakness, which is contributing to foot drop, as well as decreased balance and endurance. Pt is min A for bed mobility, min guard for transfers and min A for ambulation of 40 feet with RW. Pt requires increased mental focus to advance L LE and fatigues quickly. PT is recommending SNF level rehab at discharge to improve L sided mobility, strength and endurance. Pt may refuse unless services can be provided for care of daughter in-law and grandson. PT will continue to follow acutely.      Follow Up Recommendations SNF    Equipment Recommendations  Other (comment)(TBD at next venue)    Recommendations for Other Services OT consult     Precautions / Restrictions Precautions Precautions: Fall Restrictions Weight Bearing Restrictions: No      Mobility  Bed Mobility Overal bed mobility: Needs Assistance Bed Mobility: Supine to Sit;Sit to Supine     Supine to sit: Min guard Sit to supine: Min  assist   General bed mobility comments: min guard for coming to EoB, increased time and effort required, min A for management of L LE back into bed  Transfers Overall transfer level: Needs assistance Equipment used: None Transfers: Sit to/from Stand Sit to Stand: Min guard         General transfer comment: min guard for safety, increased time and effort with reliance on stronger R side, able to static stand prior to reaching for RW  Ambulation/Gait Ambulation/Gait assistance: Min assist Gait Distance (Feet): 40 Feet Assistive device: Rolling walker (2 wheeled) Gait Pattern/deviations: Step-through pattern;Decreased step length - right;Decreased step length - left;Shuffle;Decreased weight shift to left;Decreased dorsiflexion - left;Decreased stance time - left Gait velocity: slowed Gait velocity interpretation: <1.8 ft/sec, indicate of risk for recurrent falls General Gait Details: min A for steadying with mildly unsteady gait, no overt Lob, multimodal cues for sequencing and proximity to RW, pt with better safety when thinking about L foot advancement and performs increased L hip and knee flexion for L LE advancement      Modified Rankin (Stroke Patients Only) Modified Rankin (Stroke Patients Only) Pre-Morbid Rankin Score: No significant disability Modified Rankin: Moderately severe disability     Balance Overall balance assessment: Needs assistance Sitting-balance support: Feet supported;No upper extremity supported Sitting balance-Leahy Scale: Fair   Postural control: Right lateral lean Standing balance support: No upper extremity supported;During functional activity Standing balance-Leahy Scale: Fair  Pertinent Vitals/Pain  No denies pain     Home Living Family/patient expects to be discharged to:: Private residence Living Arrangements: Other (Comment)(caregiver for deaf daughter in-law) Available Help at Discharge:  Family;Available PRN/intermittently Type of Home: House Home Access: Ramped entrance     Home Layout: One level Home Equipment: Grab bars - tub/shower;Shower seat;Shower seat - built in;Walker - 4 wheels;Electric scooter      Prior Function Level of Independence: Independent with assistive device(s)         Comments: uses Rollator PRN, 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 since son's death earlier this year, drives to grocery store and church      Hand Dominance    Right    Extremity/Trunk Assessment   Upper Extremity Assessment Upper Extremity Assessment: RUE deficits/detail;LUE deficits/detail RUE Deficits / Details: R ROM WFL, strength grossly 4/5 RUE Sensation: WNL RUE Coordination: WNL LUE Deficits / Details: AROM WFL, increased time to perform ROM, strength grossly 3+/5 LUE Sensation: WNL LUE Coordination: decreased fine motor;decreased gross motor    Lower Extremity Assessment Lower Extremity Assessment: RLE deficits/detail;LLE deficits/detail RLE Deficits / Details: AROM WLF, Strength grossly assessed at 4/5 RLE Sensation: WNL RLE Coordination: WNL LLE Deficits / Details: AAROM WFL, strength grossly 3/5 LLE Sensation: WNL LLE Coordination: decreased fine motor;decreased gross motor       Communication   Communication: No difficulties  Cognition Arousal/Alertness: Awake/alert Behavior During Therapy: WFL for tasks assessed/performed Overall Cognitive Status: Within Functional Limits for tasks assessed                                        General Comments General comments (skin integrity, edema, etc.): VSS on RA, Pt is very worried about who will care for her daughter in-law and grandson in her absence if d/c to SNF        Assessment/Plan    PT Assessment Patient needs continued PT services  PT Problem List Decreased strength;Decreased activity tolerance;Decreased balance;Decreased mobility;Decreased coordination;Decreased safety  awareness       PT Treatment Interventions DME instruction;Gait training;Functional mobility training;Therapeutic activities;Therapeutic exercise;Balance training;Cognitive remediation;Patient/family education;Neuromuscular re-education    PT Goals (Current goals can be found in the Care Plan section)  Acute Rehab PT Goals Patient Stated Goal: go home as soon as possible PT Goal Formulation: With patient Time For Goal Achievement: 10/26/19 Potential to Achieve Goals: Good    Frequency Min 4X/week   Barriers to discharge Other (comment)(worried about care of daughter in-law and grandson in her ca)         AM-PAC PT "6 Clicks" Mobility  Outcome Measure Help needed turning from your back to your side while in a flat bed without using bedrails?: None Help needed moving from lying on your back to sitting on the side of a flat bed without using bedrails?: A Little Help needed moving to and from a bed to a chair (including a wheelchair)?: A Little Help needed standing up from a chair using your arms (e.g., wheelchair or bedside chair)?: A Little Help needed to walk in hospital room?: A Little Help needed climbing 3-5 steps with a railing? : A Lot 6 Click Score: 18    End of Session Equipment Utilized During Treatment: Gait belt Activity Tolerance: Patient tolerated treatment well Patient left: in bed;with call bell/phone within reach;with nursing/sitter in room Nurse Communication: Mobility status PT Visit Diagnosis: Unsteadiness on feet (R26.81);Other  abnormalities of gait and mobility (R26.89);Muscle weakness (generalized) (M62.81);Difficulty in walking, not elsewhere classified (R26.2);Other symptoms and signs involving the nervous system (R29.898);Hemiplegia and hemiparesis Hemiplegia - Right/Left: Left Hemiplegia - dominant/non-dominant: Non-dominant Hemiplegia - caused by: Cerebral infarction    Time: 0932-1001 PT Time Calculation (min) (ACUTE ONLY): 29 min   Charges:   PT  Evaluation $PT Eval Moderate Complexity: 1 Mod PT Treatments $Gait Training: 8-22 mins        Evelynne Spiers B. Migdalia Dk PT, DPT Acute Rehabilitation Services Pager [phone] Office [phone]   [place] 10/12/2019, 10:21 AM

## 2019-10-12 NOTE — Progress Notes (Signed)
Arrived from ER via stretcher; assisted to bed; patient is alert and appropriate.  Oriented patient to room and unit routine.  Fall safety reviewed; bed low and locked and alarm placed on the bed.

## 2019-10-12 NOTE — ED Notes (Signed)
Viually checked on Pt, she was resting well with a regular breathing pattern

## 2019-10-12 NOTE — Progress Notes (Signed)
PROGRESS NOTE    [name]    Code Status: Full Code  WCH:[id number] DOB: 02-May-1941 DOA: 10/11/2019 LOS: 1 days  PCP: [name], MD CC:  Chief Complaint  Patient presents with  . Weakness       Hospital Summary   This is an 79 year old female with past medical history of hypertension, diabetes, CAD with inferior STEMI s/p PCI in 2015, tobacco abuse, PVD s/p abdominal aneurysm repair in 2007, left femoral-popliteal bypass surgery in 2016, frequent TIAs and strokes in the past with some residual left-sided weakness who presented to the ED with worsening left-sided weakness since morning of admission.  ED course: Vitals 174/74, HR 50s to 60s.  WBC 6, Hb 13.6, platelets 166, N 136, K4.7, glucose 324, Cr 1.16 Head CT unremarkable however MRI brain with 3 to 4 mm acute infarct in posterior limb of the internal capsule on the right.  Received Plavix load.  Seen by neurology.  Did not receive TPA as she was outside therapeutic window.  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 Started on dual antiplatelets   5/16: Multiple long discussions with the patient in the ED as she was becoming verbally aggressive with staff and stated that she wanted to leave.  After multiple discussions with the patient and daughter, it was thought that the patient was suffering from nicotine withdrawal and was eventually willing to stay overnight with a nicotine patch for TOC to arrange safe dispo with home health.  A & P   Active Problems:   Hypertension   DM (diabetes mellitus) type II uncontrolled, periph vascular disorder (HCC)   Coronary artery disease involving native coronary artery of native heart with angina pectoris ([place])   CVA (cerebral vascular accident) ([place])   1. Acute stroke, right-sideded 3 to 4 mm infarction  of posterior limb of internal capsule a. Having left upper and lower extremity weakness b. PT recommending SNF however patient refusing.  After long discussion with patient and daughter plan is for home with home health.  TOC consulted and will help arrange this c. Multiple risk factors: Previous stroke, hypertension, hyperlipidemia, tobacco abuse, poorly controlled diabetes, advanced age, family history of stroke d. 10/12/2019 Echo: Grade 1 diastolic dysfunction with EF 45% and global hypokinesis -previous echo in 2016 with EF 45 to 50%, mild LVH and mild hypokinesis e. Aggressive risk factor modification: Lipitor increased to 40 mg daily, diabetic coordinator consulted f. Appreciate further neurology recommendations  2. Hypertension a. On Norvasc, Imdur and Toprol-XL b. Had soft BP this a.m. and Norvasc and Imdur were held.  BP improved and was given Toprol c. Can allow permissive hypertension with gradual normalization in 5 to 7 days  3. Hyperlipidemia/aortic atherosclerosis a. LDL 70 on Lipitor 20 mg daily b. Increase to Lipitor 40 mg daily for goal <70  4. Tobacco abuse with nicotine withdrawal a. Verbally aggressive behaviors today to staff b. Start nicotine patch  5. Poorly controlled type 2 diabetes a. HA1C 12.0 b. Has not been taking any medications for the past 3 months, previously on glimepiride c. Continue Lantus 10 units daily and sliding scale d. Diabetic coordinator consulted   6. GERD on Nexium  7. CKD 3b, at baseline   DVT prophylaxis: Heparin Family Communication: Patient's daughter has been updated  Disposition Plan:  Status is: Inpatient  Remains inpatient appropriate because:Unsafe  d/c plan   Dispo: The patient is from: Home              Anticipated d/c is to: Home              Anticipated d/c date is: 1 day              Patient currently is not medically stable to d/c.           Pressure injury documentation    None  Consultants    Neurology   Procedures  None  Antibiotics   Anti-infectives (From admission, onward)   None        Subjective   Patient very upset today regarding cleanliness of her room though it has been cleaned and she was switched rooms.  Upset that she needs to stay hospitalized and I was notified that she was making aggressive remarks to the nursing staff.  At bedside patient was also making silly racial accusations against me which I assured her was not the case.  I had a discussion with the patient's daughter over the phone who states that the patient gets this way when she is having nicotine withdrawal and has not had a cigarette.  Daughter was on her way to discuss dispo plan with the patient.  Patient able to eat lunch and without any complaints of slurred speech.  Complaining of left upper and lower extremity weakness but improved from yesterday.  Reported that she is taking care of her daughter-in-law who is deaf because her son passed away recently from cancer and had another son passed away 4 years ago.  No other complaints.  Objective   Vitals:   10/12/19 0630 10/12/19 0700 10/12/19 1031 10/12/19 1155  BP: (!) 156/87 (!) 177/73 (!) 105/92 (!) 161/65  Pulse: 61 60 72 76  Resp: 15 16  16   Temp:   98.9 F (37.2 C)   TempSrc:   Oral   SpO2: 97% 98% 98% 96%  Weight:      Height:        Intake/Output Summary (Last 24 hours) at 10/12/2019 1427 Last data filed at 10/12/2019 0250 Gross per 24 hour  Intake 10 ml  Output 900 ml  Net -890 ml   Filed Weights   10/11/19 1037  Weight: 77.1 kg    Examination:  Physical Exam Vitals and nursing note reviewed.  Constitutional:      Appearance: She is not toxic-appearing.  HENT:     Head: Normocephalic.     Mouth/Throat:     Mouth: Mucous membranes are moist.  Eyes:     Conjunctiva/sclera: Conjunctivae normal.  Cardiovascular:     Rate and Rhythm: Normal rate and regular rhythm.  Pulmonary:     Effort: Pulmonary effort is  normal. No respiratory distress.  Abdominal:     General: Abdomen is flat.     Tenderness: There is no abdominal tenderness.  Musculoskeletal:     Comments: LUE, LLE 4/5 muscle strength and decreased grip strength RUE, RLE 5/5 muscle strength  Neurological:     Mental Status: She is alert.     Data Reviewed: I have personally reviewed following labs and imaging studies  CBC: Recent Labs  Lab 10/11/19 1048 10/12/19 0500  WBC 6.4 7.3  NEUTROABS 4.0  --   HGB 13.6 13.0  HCT 42.3 39.9  MCV 89.8 88.9  PLT 166 825   Basic Metabolic Panel: Recent Labs  Lab 10/11/19 1048 10/11/19 2224 10/12/19 0010  10/12/19 0500  NA 136  --  133* 135  K 4.7  --  3.7 4.2  CL 101  --  98 100  CO2 25  --  22 24  GLUCOSE 374*  --  428* 359*  BUN 14  --  15 16  CREATININE 1.16* 1.26* 1.21* 1.17*  CALCIUM 9.6  --  9.4 9.6   GFR: Estimated Creatinine Clearance: 41.5 mL/min (A) (by C-G formula based on SCr of 1.17 mg/dL (H)). Liver Function Tests: Recent Labs  Lab 10/11/19 1048 10/12/19 0500  AST 12* 11*  ALT 10 11  ALKPHOS 73 66  BILITOT 1.0 0.7  PROT 8.0 7.5  ALBUMIN 3.5 3.3*   No results for input(s): LIPASE, AMYLASE in the last 168 hours. No results for input(s): AMMONIA in the last 168 hours. Coagulation Profile: Recent Labs  Lab 10/11/19 1048  INR 0.9   Cardiac Enzymes: No results for input(s): CKTOTAL, CKMB, CKMBINDEX, TROPONINI in the last 168 hours. BNP (last 3 results) No results for input(s): PROBNP in the last 8760 hours. HbA1C: Recent Labs    10/12/19 0016  HGBA1C 12.0*   CBG: Recent Labs  Lab 10/11/19 2222 10/11/19 2324 10/12/19 0556 10/12/19 0804 10/12/19 1424  GLUCAP 486* 507* 339* 351* 140*   Lipid Profile: Recent Labs    10/11/19 2224  CHOL 141  HDL 39*  LDLCALC 70  TRIG 159*  CHOLHDL 3.6   Thyroid Function Tests: No results for input(s): TSH, T4TOTAL, FREET4, T3FREE, THYROIDAB in the last 72 hours. Anemia Panel: No results for  input(s): VITAMINB12, FOLATE, FERRITIN, TIBC, IRON, RETICCTPCT in the last 72 hours. Sepsis Labs: No results for input(s): PROCALCITON, LATICACIDVEN in the last 168 hours.  Recent Results (from the past 240 hour(s))  SARS CORONAVIRUS 2 (TAT 6-24 HRS) Nasopharyngeal Nasopharyngeal Swab     Status: None   Collection Time: 10/11/19  3:15 PM   Specimen: Nasopharyngeal Swab  Result Value Ref Range Status   SARS Coronavirus 2 NEGATIVE NEGATIVE Final    Comment: (NOTE) SARS-CoV-2 target nucleic acids are NOT DETECTED. The SARS-CoV-2 RNA is generally detectable in upper and lower respiratory specimens during the acute phase of infection. Negative results do not preclude SARS-CoV-2 infection, do not rule out co-infections with other pathogens, and should not be used as the sole basis for treatment or other patient management decisions. Negative results must be combined with clinical observations, patient history, and epidemiological information. The expected result is Negative. Fact Sheet for Patients: SugarRoll.be Fact Sheet for Healthcare Providers: https://www.woods-mathews.com/ This test is not yet approved or cleared by the [place] FDA and  has been authorized for detection and/or diagnosis of SARS-CoV-2 by FDA under an Emergency Use Authorization (EUA). This EUA will remain  in effect (meaning this test can be used) for the duration of the COVID-19 declaration under Section 56 4(b)(1) of the Act, 21 U.S.C. section 360bbb-3(b)(1), unless the authorization is terminated or revoked sooner. Performed at [facility], [place] [address] [place], [place] [address]          Radiology Studies: CT ANGIO HEAD W OR WO CONTRAST  Result Date: 10/11/2019 CLINICAL DATA:  Stroke follow-up with residual left-sided deficits. EXAM: CT ANGIOGRAPHY HEAD AND NECK TECHNIQUE: Multidetector CT imaging of the head and neck was performed using the  standard protocol during bolus administration of intravenous contrast. Multiplanar CT image reconstructions and MIPs were obtained to evaluate the vascular anatomy. Carotid stenosis measurements (when applicable) are obtained utilizing NASCET  criteria, using the distal internal carotid diameter as the denominator. CONTRAST:  34mL OMNIPAQUE IOHEXOL 350 MG/ML SOLN COMPARISON:  Head CT 10/11/2019 Brain MRI 10/11/2019 CTA chest abdomen pelvis 03/22/2018 CTA chest 04/28/2017 FINDINGS: CTA NECK FINDINGS SKELETON: There is no bony spinal canal stenosis. No lytic or blastic lesion. OTHER NECK: Normal pharynx, larynx and major salivary glands. No cervical lymphadenopathy. Unremarkable thyroid gland. UPPER CHEST: Biapical emphysema. AORTIC ARCH: There is extensive calcific atherosclerosis of the aortic arch. There is no aneurysm, dissection or hemodynamically significant stenosis of the visualized portion of the aorta. Aberrant right subclavian artery. 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 Common carotid arteries share a origin. The visualized proximal subclavian arteries are widely patent. RIGHT CAROTID SYSTEM: Multifocal atherosclerosis within the common carotid artery. There is mixed density atherosclerosis at the carotid bifurcation but no hemodynamically significant stenosis. LEFT CAROTID SYSTEM: There is approximately 50% narrowing of the midportion of the common carotid artery. Mixed density plaque extends to the carotid bifurcation but does not cause hemodynamically significant stenosis. VERTEBRAL ARTERIES: Codominant configuration. Both origins are clearly patent. Moderate stenosis of the left V1 segment and left V3 segment. Otherwise, both vertebral arteries are patent to the vertebrobasilar confluence. CTA HEAD FINDINGS POSTERIOR CIRCULATION: --Vertebral arteries: Normal V4  segments. --Inferior cerebellar arteries: Normal. --Basilar artery: Normal. --Superior cerebellar arteries: Normal. --Posterior cerebral arteries (PCA): Normal. The left PCA is predominantly supplied by the posterior communicating artery. ANTERIOR CIRCULATION: --Intracranial internal carotid arteries: Moderate atherosclerotic calcification of the internal carotid arteries at the skull base with approximately 50% stenosis bilaterally. --Anterior cerebral arteries (ACA): Moderate stenosis of the right A2 segment. Both A1 segments are present. Otherwise normal. --Middle cerebral arteries (MCA): Mild atherosclerotic irregularity of the right M1 segment. Otherwise normal. VENOUS SINUSES: As permitted by contrast timing, patent. ANATOMIC VARIANTS: None Review of the MIP images confirms the above findings. IMPRESSION: 1. No intracranial arterial occlusion or high-grade stenosis. 2. 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 3. Moderate stenosis of the right anterior cerebral artery A2 segment. 4. 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 5. Aortic Atherosclerosis (ICD10-I70.0) and Emphysema (ICD10-J43.9). Electronically Signed   By: Ulyses Jarred M.D.   On: 10/11/2019 23:47   CT HEAD WO CONTRAST  Result Date: 10/11/2019 CLINICAL DATA:  Pt arrives via [facility] from home for c/o L sided weakness, hx of cva with L side deficits (facial droop, grip strength weak and L leg drags). Pt and family state they think L side is weaker than at baseline. LSN 6pm yesterday by family, pt reports she went to bed around 830 last night, pt states she woke up this morning feeling weaker on L side than usua EXAM: CT HEAD WITHOUT CONTRAST TECHNIQUE: Contiguous axial images were obtained from the base of the skull through the vertex without intravenous contrast. COMPARISON:  04/07/2019 FINDINGS: Brain: No evidence of acute infarction, hemorrhage, hydrocephalus, extra-axial  collection or mass lesion/mass effect. Old infarct extending from the right basal ganglia to the deep right white matter. Small old right thalamic lacunar infarct. Patchy white matter hypoattenuation most evident in the right frontal lobe consistent with chronic ischemic change. These findings are stable. Vascular: No hyperdense vessel or unexpected calcification. Skull: Normal. Negative for fracture or focal lesion. Sinuses/Orbits: Visualized globes and orbits are unremarkable. The visualized sinuses and mastoid air cells are clear. Other:  None. IMPRESSION: 1. No acute intracranial abnormalities. No change from the prior study. Electronically Signed   By: Lajean Manes M.D.   On: 10/11/2019 11:19   CT ANGIO NECK W OR WO CONTRAST  Result Date: 10/11/2019 CLINICAL DATA:  Stroke follow-up with residual left-sided deficits. EXAM: CT ANGIOGRAPHY HEAD AND NECK TECHNIQUE: Multidetector CT imaging of the head and neck was performed using the standard protocol during bolus administration of intravenous contrast. Multiplanar CT image reconstructions and MIPs were obtained to evaluate the vascular anatomy. Carotid stenosis measurements (when applicable) are obtained utilizing NASCET criteria, using the distal internal carotid diameter as the denominator. CONTRAST:  10mL OMNIPAQUE IOHEXOL 350 MG/ML SOLN COMPARISON:  Head CT 10/11/2019 Brain MRI 10/11/2019 CTA chest abdomen pelvis 03/22/2018 CTA chest 04/28/2017 FINDINGS: CTA NECK FINDINGS SKELETON: There is no bony spinal canal stenosis. No lytic or blastic lesion. OTHER NECK: Normal pharynx, larynx and major salivary glands. No cervical lymphadenopathy. Unremarkable thyroid gland. UPPER CHEST: Biapical emphysema. AORTIC ARCH: There is extensive calcific atherosclerosis of the aortic arch. There is no aneurysm, dissection or hemodynamically significant stenosis of the visualized portion of the aorta. Aberrant right subclavian artery. 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 Common carotid arteries share a origin. The visualized proximal subclavian arteries are widely patent. RIGHT CAROTID SYSTEM: Multifocal atherosclerosis within the common carotid artery. There is mixed density atherosclerosis at the carotid bifurcation but no hemodynamically significant stenosis. LEFT CAROTID SYSTEM: There is approximately 50% narrowing of the midportion of the common carotid artery. Mixed density plaque extends to the carotid bifurcation but does not cause hemodynamically significant stenosis. VERTEBRAL ARTERIES: Codominant configuration. Both origins are clearly patent. Moderate stenosis of the left V1 segment and left V3 segment. Otherwise, both vertebral arteries are patent to the vertebrobasilar confluence. CTA HEAD FINDINGS POSTERIOR CIRCULATION: --Vertebral arteries: Normal V4 segments. --Inferior cerebellar arteries: Normal. --Basilar artery: Normal. --Superior cerebellar arteries: Normal. --Posterior cerebral arteries (PCA): Normal. The left PCA is predominantly supplied by the posterior communicating artery. ANTERIOR CIRCULATION: --Intracranial internal carotid arteries: Moderate atherosclerotic calcification of the internal carotid arteries at the skull base with approximately 50% stenosis bilaterally. --Anterior cerebral arteries (ACA): Moderate stenosis of the right A2 segment. Both A1 segments are present. Otherwise normal. --Middle cerebral arteries (MCA): Mild atherosclerotic irregularity of the right M1 segment. Otherwise normal. VENOUS SINUSES: As permitted by contrast timing, patent. ANATOMIC VARIANTS: None Review of the MIP images confirms the above findings. IMPRESSION: 1. No intracranial arterial occlusion or high-grade stenosis. 2. 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 3. Moderate stenosis of the right anterior  cerebral artery A2 segment. 4. 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 5. Aortic Atherosclerosis (ICD10-I70.0) and Emphysema (ICD10-J43.9). Electronically Signed   By: Ulyses Jarred M.D.   On: 10/11/2019 23:47   MR BRAIN WO CONTRAST  Result Date: 10/11/2019 CLINICAL DATA:  Left-sided weakness affecting the arm and leg. Negative acute head CT. EXAM: MRI HEAD WITHOUT CONTRAST TECHNIQUE: Multiplanar, multiecho pulse sequences of the brain and surrounding structures were obtained without intravenous contrast. COMPARISON:  Head CT earlier same day.  MRI 02/18/2015. FINDINGS: Brain: Diffusion imaging shows a 3-4 mm acute infarction in the posterior limb internal capsule on the right. No other acute infarction. There are chronic small-vessel ischemic changes of the pons. No focal cerebellar infarction. Cerebral hemispheres show extensive chronic small-vessel  ischemic changes throughout the white matter, with a larger more confluent subcortical white matter infarction in the right frontoparietal region. Old small vessel infarctions of the basal ganglia and radiating white matter tracts, right more than left. Some hemosiderin deposition in that area. No mass, recent hemorrhage, hydrocephalus or extra-axial collection. Vascular: Major vessels at the base of the brain show flow. Skull and upper cervical spine: Negative Sinuses/Orbits: Clear/normal.  Previous lens implant on the right Other: None IMPRESSION: 3-4 mm acute infarction in the posterior limb internal capsule on the right. Extensive old ischemic changes elsewhere throughout the brain as outlined above. Electronically Signed   By: Nelson Chimes M.D.   On: 10/11/2019 12:59        Scheduled Meds: .  stroke: mapping our early stages of recovery book   Does not apply Once  . amLODipine  5 mg Oral Daily  . aspirin EC  81 mg Oral Daily  . atorvastatin  40 mg Oral Daily  . clopidogrel  75 mg Oral Daily  .  heparin  5,000 Units Subcutaneous Q8H  . insulin aspart  0-15 Units Subcutaneous TID WC  . insulin aspart  0-5 Units Subcutaneous QHS  . insulin glargine  10 Units Subcutaneous Daily  . isosorbide mononitrate  60 mg Oral Daily  . metoprolol succinate  100 mg Oral Daily  . nicotine  14 mg Transdermal Daily  . pantoprazole  40 mg Oral Daily  . sodium chloride flush  10-40 mL Intracatheter Q12H  . spironolactone  25 mg Oral Daily   Continuous Infusions:   Time spent: 60 minutes with over 50% of the time coordinating the patient's care    Harold Hedge, DO [facility] Pager [phone]  Call night coverage person covering after 7pm

## 2019-10-12 NOTE — ED Notes (Signed)
Pt complained of leg cramps , mustard was given and legs stretched out. It seemed to alviate the problem

## 2019-10-12 NOTE — Progress Notes (Signed)
STROKE TEAM PROGRESS NOTE   INTERVAL HISTORY Her daughter is at the bedside.  Patient lying in bed, awake alert, fully orientated.  Still has left arm and leg weakness more than her baseline residual.  MRI showed right internal capsule small infarct.  PT recommend SNF, however, patient would like to have home PT/OT.   OBJECTIVE Vitals:   10/12/19 0430 10/12/19 0500 10/12/19 0530 10/12/19 0600  BP: (!) 176/78 (!) 157/81 (!) 177/88 (!) 169/75  Pulse: 62 75 62 66  Resp: 15 16 15 16   Temp:      TempSrc:      SpO2: 98% 98% 95% 96%  Weight:      Height:        CBC:  Recent Labs  Lab 10/11/19 1048 10/12/19 0500  WBC 6.4 7.3  NEUTROABS 4.0  --   HGB 13.6 13.0  HCT 42.3 39.9  MCV 89.8 88.9  PLT 166 497    Basic Metabolic Panel:  Recent Labs  Lab 10/12/19 0010 10/12/19 0500  NA 133* 135  K 3.7 4.2  CL 98 100  CO2 22 24  GLUCOSE 428* 359*  BUN 15 16  CREATININE 1.21* 1.17*  CALCIUM 9.4 9.6    Lipid Panel:     Component Value Date/Time   CHOL 141 10/11/2019 2224   TRIG 159 (H) 10/11/2019 2224   HDL 39 (L) 10/11/2019 2224   CHOLHDL 3.6 10/11/2019 2224   VLDL 32 10/11/2019 2224   LDLCALC 70 10/11/2019 2224   HgbA1c:  Lab Results  Component Value Date   HGBA1C 12.0 (H) 10/12/2019   Urine Drug Screen:     Component Value Date/Time   LABOPIA NONE DETECTED 03/18/2014 0917   COCAINSCRNUR NONE DETECTED 03/18/2014 0917   LABBENZ NONE DETECTED 03/18/2014 0917   AMPHETMU NONE DETECTED 03/18/2014 0917   THCU NONE DETECTED 03/18/2014 0917   LABBARB NONE DETECTED 03/18/2014 0917    Alcohol Level     Component Value Date/Time   ETH <11 03/18/2014 0732    IMAGING  CT ANGIO HEAD W OR WO CONTRAST CT ANGIO NECK W OR WO CONTRAST 10/11/2019 IMPRESSION:  1. No intracranial arterial occlusion or high-grade stenosis.  2. 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  3. Moderate stenosis of the right anterior cerebral artery A2 segment.  4.  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  5. Aortic Atherosclerosis (ICD10-I70.0) and Emphysema (ICD10-J43.9).   CT HEAD WO CONTRAST 10/11/2019 IMPRESSION:  1. No acute intracranial abnormalities. No change from the prior study.  MR BRAIN WO CONTRAST 10/11/2019 IMPRESSION:  3-4 mm acute infarction in the posterior limb internal capsule on the right. Extensive old ischemic changes elsewhere throughout the brain as outlined above.   ECG - SR rate 63 BPM. (See cardiology reading for complete details)   PHYSICAL EXAM  Temp:  [97.4 F (36.3 C)-99.5 F (37.5 C)] 97.9 F (36.6 C) (05/16 1613) Pulse Rate:  [52-86] 60 (05/16 1613) Resp:  [14-18] 18 (05/16 1613) BP: (105-229)/(60-133) 164/66 (05/16 1613) SpO2:  [93 %-99 %] 99 % (05/16 1613)  General - Well nourished, well developed, in no apparent distress.  Ophthalmologic - fundi not visualized due to noncooperation.  Cardiovascular - Regular rhythm and rate.  Mental Status -  Level of arousal and orientation to time, place, and person were intact. Language including expression, naming, repetition, comprehension was assessed and found intact. Fund of Knowledge was assessed and was intact.  Cranial Nerves II - XII - II - Visual field intact OU. III, IV, VI - Extraocular movements intact. V - Facial sensation intact bilaterally. VII - mild left facial droop. VIII - Hearing & vestibular intact bilaterally. X - Palate elevates symmetrically. XI - Chin turning & shoulder shrug intact bilaterally. XII - Tongue protrusion intact.  Motor Strength - The patient's strength was normal in RUE and RLE, however, LUE 4/5 proximal and 2/5 finger grip, LLE 4/5 proximal and distal. Bulk was normal and fasciculations were absent.   Motor Tone - Muscle tone was assessed at the neck and appendages and was normal.  Reflexes - The patient's reflexes were symmetrical in all extremities and she had no  pathological reflexes.  Sensory - Light touch, temperature/pinprick were assessed and were symmetrical.    Coordination - The patient had normal movements in the right hand with no ataxia or dysmetria. L FTN ataxic but proportional to the weakness. Tremor was absent.  Gait and Station - deferred.   ASSESSMENT/PLAN [name] is a 79 y.o. 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  The patient did not receive IV t-PA due to late presentation (>4.5 hours from time of onset).  Stroke: right PLIC small infarct likely due to small vessel disease.  CT head - No acute intracranial abnormalities. No change from the prior study.  MRI head - 3-4 mm acute infarction in the posterior limb internal capsule on the right. Extensive old ischemic changes elsewhere throughout the brain as outlined above.   CTA H&N - No intracranial arterial occlusion or high-grade stenosis. 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 Moderate stenosis of the right anterior cerebral artery A2 segment. 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   2D Echo - EF 45%  [employer] Virus 2 - negative  LDL - 70  HgbA1c - 12.0  UDS - pending  VTE prophylaxis - [place] Heparin  aspirin 81 mg daily and clopidogrel 75 mg daily prior to admission, now on aspirin 81 mg daily and clopidogrel 75 mg daily. Continue DAPT on discharge  Patient counseled to be compliant with her antithrombotic medications  Ongoing aggressive stroke risk factor management  Therapy recommendations:  pending  Disposition:  Pending  History of stroke  01/2015 admitted for right CR infarct.  MRI negative.  Carotid Doppler and TEE unremarkable.  LDL 137, A1c 8.4.  Continued her aspirin and Brilinta and pravastatin on discharge   Hypertension  Home  BP meds: Norvasc ; metoprolol  Current BP meds: Norvasc ; metoprolol  Stable . Permissive hypertension (OK if < 220/120) but gradually normalize in 5-7 days  . Long-term BP goal normotensive  Hyperlipidemia  Home Lipid lowering medication: Lipitor 20 mg daily  LDL 70, goal < 70  Current lipid lowering medication: Lipitor 40 mg daily   Continue statin at discharge  Diabetes  Home diabetic meds: none   Current diabetic meds: insulin  HgbA1c 12.0, goal < 7.0  Hyperglycemia  SSI  CBG monitoring  Tobacco abuse  Current smoker  Smoking cessation counseling provided  Pt is willing to quit  Other Stroke Risk Factors  Advanced age  PAD status post femoropopliteal bypass in 06/2014  Family hx stroke (mother)  Coronary artery disease/MI  AAA status post repair  Other Active Problems  Code status - Full  code  CKD stage 3a - Creatinine - 1.21->1.17   Hospital day # 1  Neurology will sign off. Please call with questions. Pt will follow up with stroke clinic NP at [facility] in about 4 weeks. Thanks for the consult.  Rosalin Hawking, MD PhD Stroke Neurology 10/12/2019 4:50 PM    To contact Stroke Continuity provider, please refer to [email or web address] After hours, contact General Neurology

## 2019-10-13 DIAGNOSIS — Z9889 Other specified postprocedural states: Secondary | ICD-10-CM

## 2019-10-13 DIAGNOSIS — R739 Hyperglycemia, unspecified: Secondary | ICD-10-CM

## 2019-10-13 DIAGNOSIS — Z8679 Personal history of other diseases of the circulatory system: Secondary | ICD-10-CM

## 2019-10-13 DIAGNOSIS — E7800 Pure hypercholesterolemia, unspecified: Secondary | ICD-10-CM

## 2019-10-13 DIAGNOSIS — I739 Peripheral vascular disease, unspecified: Secondary | ICD-10-CM

## 2019-10-13 DIAGNOSIS — I10 Essential (primary) hypertension: Secondary | ICD-10-CM

## 2019-10-13 LAB — GLUCOSE, CAPILLARY
Glucose-Capillary: 226 mg/dL — ABNORMAL HIGH (ref 70–99)
Glucose-Capillary: 234 mg/dL — ABNORMAL HIGH (ref 70–99)
Glucose-Capillary: 287 mg/dL — ABNORMAL HIGH (ref 70–99)
Glucose-Capillary: 322 mg/dL — ABNORMAL HIGH (ref 70–99)
Glucose-Capillary: 136 mg/dL — ABNORMAL HIGH (ref 70–99)

## 2019-10-13 LAB — CBC
HCT: 39 % (ref 36.0–46.0)
Hemoglobin: 12.9 g/dL (ref 12.0–15.0)
MCH: 29.3 pg (ref 26.0–34.0)
MCHC: 33.1 g/dL (ref 30.0–36.0)
MCV: 88.6 fL (ref 80.0–100.0)
Platelets: 148 10*3/uL — ABNORMAL LOW (ref 150–400)
RBC: 4.4 MIL/uL (ref 3.87–5.11)
RDW: 14.6 % (ref 11.5–15.5)
WBC: 6.5 10*3/uL (ref 4.0–10.5)
nRBC: 0 % (ref 0.0–0.2)

## 2019-10-13 LAB — COMPREHENSIVE METABOLIC PANEL
ALT: 10 U/L (ref 0–44)
AST: 14 U/L — ABNORMAL LOW (ref 15–41)
Albumin: 2.9 g/dL — ABNORMAL LOW (ref 3.5–5.0)
Alkaline Phosphatase: 50 U/L (ref 38–126)
Anion gap: 9 (ref 5–15)
BUN: 21 mg/dL (ref 8–23)
CO2: 22 mmol/L (ref 22–32)
Calcium: 9.1 mg/dL (ref 8.9–10.3)
Chloride: 106 mmol/L (ref 98–111)
Creatinine, Ser: 1.2 mg/dL — ABNORMAL HIGH (ref 0.44–1.00)
GFR calc Af Amer: 49 mL/min — ABNORMAL LOW (ref >60)
GFR calc non Af Amer: 43 mL/min — ABNORMAL LOW (ref >60)
Glucose, Bld: 212 mg/dL — ABNORMAL HIGH (ref 70–99)
Potassium: 4 mmol/L (ref 3.5–5.1)
Sodium: 137 mmol/L (ref 135–145)
Total Bilirubin: 1.1 mg/dL (ref 0.3–1.2)
Total Protein: 6.7 g/dL (ref 6.5–8.1)

## 2019-10-13 MED ORDER — INSULIN GLARGINE 100 UNIT/ML ~~LOC~~ SOLN
14.0000 [IU] | Freq: Every day | SUBCUTANEOUS | Status: DC
  Administered 2019-10-14 – 2019-10-15 (×2): 14 [IU] via SUBCUTANEOUS
  Filled 2019-10-13 (×2): qty 0.14

## 2019-10-13 MED ORDER — INSULIN ASPART 100 UNIT/ML ~~LOC~~ SOLN
3.0000 [IU] | Freq: Three times a day (TID) | SUBCUTANEOUS | Status: DC
  Administered 2019-10-13 – 2019-10-15 (×7): 3 [IU] via SUBCUTANEOUS

## 2019-10-13 MED ORDER — ENSURE MAX PROTEIN PO LIQD
11.0000 [oz_av] | Freq: Two times a day (BID) | ORAL | Status: DC
  Administered 2019-10-13 – 2019-10-15 (×4): 11 [oz_av] via ORAL
  Filled 2019-10-13 (×6): qty 330

## 2019-10-13 MED ORDER — ADULT MULTIVITAMIN W/MINERALS CH
1.0000 | ORAL_TABLET | Freq: Every day | ORAL | Status: DC
  Administered 2019-10-13 – 2019-10-15 (×3): 1 via ORAL
  Filled 2019-10-13 (×3): qty 1

## 2019-10-13 NOTE — NC FL2 (Signed)
[place] MEDICAID FL2 LEVEL OF CARE SCREENING TOOL     IDENTIFICATION  Patient Name: [name] Birthdate: August 20, 1940 Sex: female Admission Date (Current Location): 10/11/2019  [facility] and [place] Number:  Herbalist and Address:  [facility], [place] [address] [place], [place] [address]      Provider Number: [id number]  Attending Physician Name and Address:  Harold Hedge, MD  Relative Name and Phone Number:       Current Level of Care: Hospital Recommended Level of Care: [place] Prior Approval Number:    Date Approved/Denied:   PASRR Number: [id number]  Discharge Plan: SNF    Current Diagnoses: Patient Active Problem List   Diagnosis Date Noted  . CVA (cerebral vascular accident) ([facility]) 10/11/2019  . Hypertensive urgency 04/07/2019  . CKD (chronic kidney disease) stage 3, GFR 30-59 ml/min 04/07/2019  . Chest pain 04/28/2017  . History of ST elevation myocardial infarction (STEMI) 04/28/2017  . Grief reaction 04/28/2017  . Osteoarthritis of left hip 05/18/2015  . Status post total replacement of left hip 05/18/2015  . Coronary artery disease involving native coronary artery of native heart with angina pectoris ([place]) 04/16/2015  . History of stroke 04/16/2015  . Type 2 diabetes mellitus with circulatory disorder ([place]) 04/16/2015  . Cerebrovascular 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 artery ([place]) 04/16/2015  . HLD (hyperlipidemia)   . Acute CVA (cerebrovascular accident) ([place]) 02/19/2015  . Transient ischemic attack (TIA) 02/18/2015  . Stroke ([place])   . Cerebral infarction due to thrombosis of right middle cerebral artery ([place])   . Acute myocardial infarction of inferolateral wall (H[facility]) 04/12/2014  . Numbness 03/18/2014  . TIA (transient ischemic attack) 03/18/2014  . DM (diabetes mellitus) type II uncontrolled, periph vascular disorder ([place]) 03/18/2014  . Hyperlipidemia   . Hypertension    . PVD (peripheral vascular disease) ([place]) 03/05/2014  . Transient cerebral ischemia 08/01/2012  . Atherosclerosis of native arteries of extremity with intermittent claudication ([place]) 10/19/2011  . Unspecified disorders of arteries and arterioles 10/19/2011  . PAD (peripheral artery disease) ([place]) 04/13/2011    Orientation RESPIRATION BLADDER Height & Weight     Self, Time, Situation, Place  Normal Continent Weight: 77.1 kg Height:  5' 7.5" (171.5 cm)  BEHAVIORAL SYMPTOMS/MOOD NEUROLOGICAL BOWEL NUTRITION STATUS      Continent Diet(carb modified/ thin liquids)  AMBULATORY STATUS COMMUNICATION OF NEEDS Skin   Extensive Assist Verbally Normal                       Personal Care Assistance Level of Assistance  Bathing, Dressing, Feeding Bathing Assistance: Limited assistance Feeding assistance: Limited assistance Dressing Assistance: Maximum assistance     Functional Limitations Info  Sight, Speech, Hearing Sight Info: Adequate Hearing Info: Adequate Speech Info: Adequate    SPECIAL CARE FACTORS FREQUENCY  PT (By licensed PT), OT (By licensed OT), Speech therapy     PT Frequency: 5x/wk OT Frequency: 5x/wk     Speech Therapy Frequency: 3x/wk      Contractures Contractures Info: Not present    Additional Factors Info  Code Status, Allergies, Insulin Sliding Scale Code Status Info: Full Allergies Info: Keflex/ lipitor/ codeine/ hydrochlorothiazide/ hydrocodone-acetaminophen/ metformin/ tramadol/ azor/ lisinopril/ penicilin   Insulin Sliding Scale Info: Novolog 0-15 units SQ three times a day/ Novolog 0-5 units SQ at bedtime       Current Medications (10/13/2019):  This is the current hospital active medication  list Current Facility-Administered Medications  Medication Dose Route Frequency Provider Last Rate Last Admin  .  stroke: mapping our early stages of recovery book   Does not apply Once Mujtaba, Mohammadtokir, MD      . acetaminophen (TYLENOL) tablet 650  mg  650 mg Oral QHS PRN Mujtaba, Mohammadtokir, MD      . albuterol (PROVENTIL) (2.5 MG/3ML) 0.083% nebulizer solution 3 mL  3 mL Inhalation Q4H PRN Mujtaba, Mohammadtokir, MD      . amLODipine (NORVASC) tablet 5 mg  5 mg Oral Daily Mujtaba, Mohammadtokir, MD   5 mg at 10/13/19 0936  . aspirin EC tablet 81 mg  81 mg Oral Daily Mujtaba, Mohammadtokir, MD   81 mg at 10/13/19 0939  . atorvastatin (LIPITOR) tablet 40 mg  40 mg Oral Daily Rosalin Hawking, MD   40 mg at 10/13/19 0936  . clopidogrel (PLAVIX) tablet 75 mg  75 mg Oral Daily Mujtaba, Mohammadtokir, MD   75 mg at 10/13/19 0936  . heparin injection 5,000 Units  5,000 Units Subcutaneous Q8H Mujtaba, Mohammadtokir, MD   5,000 Units at 10/13/19 1497  . hydrALAZINE (APRESOLINE) tablet 10 mg  10 mg Oral Q6H PRN Mujtaba, Mohammadtokir, MD      . insulin aspart (novoLOG) injection 0-15 Units  0-15 Units Subcutaneous TID WC Mujtaba, Mohammadtokir, MD   5 Units at 10/13/19 0720  . insulin aspart (novoLOG) injection 0-5 Units  0-5 Units Subcutaneous QHS Mujtaba, Mohammadtokir, MD      . Derrill Memo ON 10/14/2019] insulin glargine (LANTUS) injection 14 Units  14 Units Subcutaneous Daily Harold Hedge, MD      . isosorbide mononitrate (IMDUR) 24 hr tablet 60 mg  60 mg Oral Daily Mujtaba, Mohammadtokir, MD   60 mg at 10/13/19 0936  . metoprolol succinate (TOPROL-XL) 24 hr tablet 100 mg  100 mg Oral Daily Mujtaba, Mohammadtokir, MD   100 mg at 10/13/19 0936  . nicotine (NICODERM CQ - dosed in mg/24 hours) patch 14 mg  14 mg Transdermal Daily Harold Hedge, MD   14 mg at 10/13/19 0939  . pantoprazole (PROTONIX) EC tablet 40 mg  40 mg Oral Daily Mujtaba, Mohammadtokir, MD   40 mg at 10/13/19 0936  . sodium chloride flush (NS) 0.9 % injection 10-40 mL  10-40 mL Intracatheter Q12H Mujtaba, Mohammadtokir, MD   10 mL at 10/13/19 0939  . sodium chloride flush (NS) 0.9 % injection 10-40 mL  10-40 mL Intracatheter PRN Mujtaba, Mohammadtokir, MD      . spironolactone  (ALDACTONE) tablet 25 mg  25 mg Oral Daily Mujtaba, Mohammadtokir, MD   25 mg at 10/13/19 0263     Discharge Medications: Please see discharge summary for a list of discharge medications.  Relevant Imaging Results:  Relevant Lab Results:   Additional Information SS#: [id number]  Pollie Friar, RN

## 2019-10-13 NOTE — Evaluation (Signed)
Occupational Therapy Evaluation Patient Details Name: [name] MRN: [id number] DOB: July 26, 1940 Today's Date: 10/13/2019    History of Present Illness 79 y.o. female with HTN, DM,  CAD with inferior STEMI s/p PCI to dRCA 03/2014, PVD s/p abdominal aneurysm repair-08/2005, Left Femoral-popliteal bypass surgery-07/06/2014, frequent TIAs- since 2002 and strokes in past has chronic speech and left sided weakness who presented to the ED with worsening left-sided weakness since a.m. 10/11/19. MRI with 3-4 mm acute infarction in the posterior limb internal capsule on the right. Admitted for treatment of CVA.    Clinical Impression   PTA patient independent. Admitted for above and limited by problem list below, including L sided weakness and impaired coordination, impaired balance, decreased activity tolerance, impaired cognition (memory, attention, safety, problem solving).  She completes ADls with min-mod assist, transfers and in room mobility using RW with min guard to min assist for safety/balance.  She requires cueing for L UE coordination and compensatory techniques, safety and attention to L LE foot clearance during mobility.  Patient educated on exercises to complete with L UE, positioning, and functional use.  Patient voiced understanding with all education and recommendations.  She will benefit from further OT services while admitted and after dc at SNF level to optimize independence and safety with ADLs, mobility and IADLs prior to returning home.     Follow Up Recommendations  SNF;Supervision/Assistance - 24 hour    Equipment Recommendations  Other (comment)(TBD at next venue of care)    Recommendations for Other Services       Precautions / Restrictions Precautions Precautions: Fall Restrictions Weight Bearing Restrictions: No      Mobility Bed Mobility               General bed mobility comments: OOB upon entry  Transfers Overall transfer level: Needs  assistance Equipment used: Rolling walker (2 wheeled) Transfers: Sit to/from Stand Sit to Stand: Min assist;Min guard         General transfer comment: min guard to min assist to steady with cueing for safety and hand placement     Balance Overall balance assessment: Needs assistance Sitting-balance support: Feet supported;No upper extremity supported Sitting balance-Leahy Scale: Fair Sitting balance - Comments: able to don/doff shoes in recliner with supervision   Standing balance support: Bilateral upper extremity supported;No upper extremity supported;During functional activity Standing balance-Leahy Scale: Fair Standing balance comment: preference to BUE support dyanimcally, able to groom at sink with min guard without UE support                           ADL either performed or assessed with clinical judgement   ADL Overall ADL's : Needs assistance/impaired Eating/Feeding: Minimal assistance   Grooming: Minimal assistance;Standing;Wash/dry hands   Upper Body Bathing: Minimal assistance;Sitting   Lower Body Bathing: Minimal assistance;Sit to/from stand   Upper Body Dressing : Minimal assistance;Sitting   Lower Body Dressing: Moderate assistance;Sit to/from stand   Toilet Transfer: Minimal assistance;Ambulation;RW Toilet Transfer Details (indicate cue type and reason): simulated to/from recliner  Toileting- Clothing Manipulation and Hygiene: Minimal assistance;Sit to/from stand       Functional mobility during ADLs: Minimal assistance;Min guard;Rolling walker;Cueing for safety General ADL Comments: pt limited by decreased strength and coordination of L UE, impaired balance, decreased safety and attention when completing ADLs     Vision   Vision Assessment?: No apparent visual deficits     Perception     Praxis  Pertinent Vitals/Pain Pain Assessment: No/denies pain     Hand Dominance Right   Extremity/Trunk Assessment Upper Extremity  Assessment Upper Extremity Assessment: LUE deficits/detail LUE Deficits / Details: AROM WFL, increased effort for coordination, grossly 3/5 MMT; slightly increased tone in hand  LUE Sensation: decreased light touch LUE Coordination: decreased fine motor;decreased gross motor   Lower Extremity Assessment Lower Extremity Assessment: Defer to PT evaluation   Cervical / Trunk Assessment Cervical / Trunk Assessment: Normal   Communication Communication Communication: No difficulties   Cognition Arousal/Alertness: Awake/alert Behavior During Therapy: WFL for tasks assessed/performed Overall Cognitive Status: No family/caregiver present to determine baseline cognitive functioning Area of Impairment: Safety/judgement;Memory;Awareness;Problem solving;Attention                   Current Attention Level: Sustained Memory: Decreased short-term memory   Safety/Judgement: Decreased awareness of safety Awareness: Emergent Problem Solving: Requires verbal cues General Comments: pt endorses some decreased STM at baseline, mild decreased safety awareness and verbal cueing required, decreased attetnion to task and mulittasking     General Comments       Exercises     Shoulder Instructions      Home Living Family/patient expects to be discharged to:: Private residence Living Arrangements: Other (Comment)(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 ) Available Help at Discharge: Family;Available PRN/intermittently Type of Home: House Home Access: Ramped entrance     Home Layout: One level     Bathroom Shower/Tub: Occupational psychologist: Handicapped height Bathroom Accessibility: Yes   Home Equipment: Grab bars - tub/shower;Shower seat;Shower seat - built in;Walker - 4 wheels;Electric scooter          Prior Functioning/Environment Level of Independence: Independent with assistive device(s)        Comments: uses Rollator PRN, 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 since  son's death earlier this year, drives to grocery store and church         OT Problem List: Decreased strength;Decreased activity tolerance;Impaired balance (sitting and/or standing);Decreased coordination;Decreased cognition;Decreased safety awareness;Decreased knowledge of use of DME or AE;Decreased knowledge of precautions;Impaired UE functional use;Impaired tone;Impaired sensation      OT Treatment/Interventions: Self-care/ADL training;DME and/or AE instruction;Neuromuscular education;Therapeutic activities;Cognitive remediation/compensation;Patient/family education;Balance training    OT Goals(Current goals can be found in the care plan section) Acute Rehab OT Goals Patient Stated Goal: to get to rehab and then home  OT Goal Formulation: With patient Time For Goal Achievement: 10/27/19 Potential to Achieve Goals: Good  OT Frequency: Min 2X/week   Barriers to D/C:            Co-evaluation              AM-PAC OT "6 Clicks" Daily Activity     Outcome Measure Help from another person eating meals?: A Little Help from another person taking care of personal grooming?: A Little Help from another person toileting, which includes using toliet, bedpan, or urinal?: A Little Help from another person bathing (including washing, rinsing, drying)?: A Little Help from another person to put on and taking off regular upper body clothing?: A Little Help from another person to put on and taking off regular lower body clothing?: A Lot 6 Click Score: 17   End of Session Equipment Utilized During Treatment: Gait belt;Rolling walker  Activity Tolerance: Patient tolerated treatment well Patient left: in chair;with call bell/phone within reach;with chair alarm set  OT Visit Diagnosis: Other abnormalities of gait and mobility (R26.89);Hemiplegia and hemiparesis Hemiplegia - Right/Left: Left Hemiplegia -  dominant/non-dominant: Non-Dominant Hemiplegia - caused by: Cerebral infarction                 Time: 1157-1220 OT Time Calculation (min): 23 min Charges:  OT General Charges $OT Visit: 1 Visit OT Evaluation $OT Eval Moderate Complexity: 1 Mod OT Treatments $Self Care/Home Management : 8-22 mins  [name], OT Acute Rehabilitation Services Pager [phone] Office [phone]   Delight Stare 10/13/2019, 1:10 PM

## 2019-10-13 NOTE — Progress Notes (Addendum)
Inpatient Diabetes Program Recommendations  AACE/ADA: New Consensus Statement on Inpatient Glycemic Control (2015)  Target Ranges:  Prepandial:   less than 140 mg/dL      Peak postprandial:   less than 180 mg/dL (1-2 hours)      Critically ill patients:  140 - 180 mg/dL   Lab Results  Component Value Date   GLUCAP 234 (H) 10/13/2019   HGBA1C 12.0 (H) 10/12/2019    Review of Glycemic Control Results for EMRY, TOBIN (MRN [id number]) as of 10/13/2019 09:10  Ref. Range 10/12/2019 18:13 10/12/2019 21:16 10/13/2019 06:12 10/13/2019 08:18  Glucose-Capillary Latest Ref Range: 70 - 99 mg/dL 268 (H) 192 (H) 226 (H) 234 (H)   Diabetes history: Type 2 DM Outpatient Diabetes medications: none Current orders for Inpatient glycemic control: Lantus 10 units QD, Novolog 0-15 units TID, Novolog 0-5 units QHS  Inpatient Diabetes Program Recommendations:    Consider increasing Lantus to 14 units QD.  Addendum: Spoke with patient regarding outpatient diabetes management. Patient has taken Glimepiride in the past, however it was discontinued due to "kidney blockage". Patient was last seen by PCP in Jan and wasn't told her diabetes needed attention, unable to see results per Care Everywhere.  Reviewed patient's current A1c of 12.0%. Explained what a A1c is and what it measures. Also reviewed goal A1c with patient, importance of good glucose control @ home, and blood sugar goals. Reviewed patho of DM, need for improved control, risk for repeated CVA, role of pancreas, impact of stress on glycemic control, vascular changes and commorbidities.  Patient needs a meter at discharge and is requesting a Breeze 2 meter device kit. [phone]). Encouraged to begin checking at least two times per day and educated on when to call MD.  Admits to drinking regular sodas everyday. Reviewed alternatives to sugary beverages, plate method, and the importance of being mindful of carb intake. Educated patient on insulin pen use at  home. Reviewed contents of insulin flexpen starter kit. Reviewed all steps if insulin pen including attachment of needle, 2-unit air shot, dialing up dose, giving injection, removing needle, disposal of sharps, storage of unused insulin, disposal of insulin etc. Patient able to provide successful return demonstration. Also reviewed troubleshooting with insulin pen.  Discussed possible need for insulin at discharge. After demonstrating insulin pen and discussing in detail, patient is not interested in insulin at discharge. Patient will need close follow up with PCP and would recommend adding oral agents.  Thanks, Bronson Curb, MSN, RNC-OB Diabetes Coordinator [phone] (8a-5p)

## 2019-10-13 NOTE — Progress Notes (Signed)
PROGRESS NOTE    [name]    Code Status: Full Code  FKC:[id number] DOB: 12-29-1940 DOA: 10/11/2019 LOS: 2 days  PCP: [name], MD CC:  Chief Complaint  Patient presents with  . Weakness       Hospital Summary   This is an 79 year old female with past medical history of hypertension, diabetes, CAD with inferior STEMI s/p PCI in 2015, tobacco abuse, PVD s/p abdominal aneurysm repair in 2007, left femoral-popliteal bypass surgery in 2016, frequent TIAs and strokes in the past with some residual left-sided weakness who presented to the ED with worsening left-sided weakness since morning of admission.  ED course: Vitals 174/74, HR 50s to 60s.  WBC 6, Hb 13.6, platelets 166, N 136, K4.7, glucose 324, Cr 1.16 Head CT unremarkable however MRI brain with 3 to 4 mm acute infarct in posterior limb of the internal capsule on the right.  Received Plavix load.  Seen by neurology.  Did not receive TPA as she was outside therapeutic window.  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 Started on dual antiplatelets   5/16: Multiple long discussions with the patient in the ED as she was becoming verbally aggressive with staff and stated that she wanted to leave.  After multiple discussions with the patient and daughter, it was thought that the patient was suffering from nicotine withdrawal and was eventually willing to stay overnight with a nicotine patch for TOC to arrange safe dispo with home health.  5/17: patient now amenable to going to SNF. TOC notified of change  A & P   Active Problems:   Hypertension   DM (diabetes mellitus) type II uncontrolled, periph vascular disorder (HCC)   Coronary artery disease involving native coronary artery of native heart with angina pectoris ([place])   CVA (cerebral vascular  accident) ([place])   1. Acute stroke, right-sideded 3 to 4 mm infarction of posterior limb of internal capsule a. Residual left upper and lower extremity weakness b. Multiple risk factors: Previous stroke, hypertension, hyperlipidemia, tobacco abuse, poorly controlled diabetes, advanced age, family history of stroke c. 10/12/2019 Echo: Grade 1 diastolic dysfunction with EF 45% and global hypokinesis -previous echo in 2016 with EF 45 to 50%, mild LVH and mild hypokinesis d. Aggressive risk factor modification: Lipitor increased to 40 mg daily, diabetic coordinator consulted e. Continue Aspirin/Plavix indefinitely with outpatient neuro follow up f. Initially refusing SNF but now amenable to going, TOC team notified  2. Hypertension a. On Norvasc, Imdur and Toprol-XL  3. Hyperlipidemia/aortic atherosclerosis a. LDL 70 on Lipitor 20 mg daily. Increased to Lipitor 40 mg daily for goal <70  4. Tobacco abuse with nicotine withdrawal a. Verbally aggressive behaviors today to staff b. Started nicotine patch  5. Poorly controlled type 2 diabetes a. HA1C 12.0 b. Has not been taking any medications for the past 3 months, previously on glimepiride c. Lantus 14 units daily, novolog 3 units TID with meals and sliding scale d. Diabetic coordinator consulted - needs meter at discharge and patient not wanting to start on insulin, prefers oral therapy at discharge. Needs close outpatient follow up   6. GERD on Nexium  7. CKD 3b, at baseline   DVT prophylaxis: Heparin Family Communication: Patient's daughter has been updated  Disposition Plan:  Status is: Inpatient  Remains inpatient appropriate because:Unsafe d/c plan   Dispo: The  patient is from: Home              Anticipated d/c is to: SNF              Anticipated d/c date is: 1 day              Patient currently is medically stable to d/c.      Pressure injury documentation    None  Consultants  Neurology   Procedures   None  Antibiotics   Anti-infectives (From admission, onward)   None        Subjective   Patient states that she is not upset at the staff anymore and is in a better mood. She decided it was better to go to SNF instead of home at discharge and is happy with this decision. She still has some decreased grip strength of left hand but otherwise feels fine.   Objective   Vitals:   10/13/19 0356 10/13/19 0816 10/13/19 1250 10/13/19 1514  BP: (!) 169/68 (!) 148/67 (!) 125/57 123/60  Pulse: (!) 59 (!) 56 68 66  Resp: 17 18 20 20   Temp: (!) 97.5 F (36.4 C) 98.3 F (36.8 C) 97.6 F (36.4 C) 98.1 F (36.7 C)  TempSrc: Oral Oral Oral Oral  SpO2: 98% 98% 98% 100%  Weight:      Height:        Intake/Output Summary (Last 24 hours) at 10/13/2019 1541 Last data filed at 10/13/2019 1414 Gross per 24 hour  Intake 150 ml  Output --  Net 150 ml   Filed Weights   10/11/19 1037  Weight: 77.1 kg    Examination:  Physical Exam Vitals and nursing note reviewed.  Constitutional:      Appearance: Normal appearance.  HENT:     Head: Normocephalic and atraumatic.  Eyes:     Conjunctiva/sclera: Conjunctivae normal.  Cardiovascular:     Rate and Rhythm: Normal rate and regular rhythm.  Pulmonary:     Effort: Pulmonary effort is normal.     Breath sounds: Normal breath sounds.  Abdominal:     General: Abdomen is flat.     Palpations: Abdomen is soft.  Musculoskeletal:        General: No swelling or tenderness.  Skin:    Coloration: Skin is not jaundiced or pale.  Neurological:     Mental Status: She is alert.     Comments: 4/5 LUE/LLE strength and decreased L hand grip strength 5/5 RUE muscle strength  Psychiatric:        Mood and Affect: Mood normal.        Behavior: Behavior normal.     Data Reviewed: I have personally reviewed following labs and imaging studies  CBC: Recent Labs  Lab 10/11/19 1048 10/12/19 0500 10/13/19 0308  WBC 6.4 7.3 6.5  NEUTROABS 4.0  --    --   HGB 13.6 13.0 12.9  HCT 42.3 39.9 39.0  MCV 89.8 88.9 88.6  PLT 166 157 093*   Basic Metabolic Panel: Recent Labs  Lab 10/11/19 1048 10/11/19 2224 10/12/19 0010 10/12/19 0500 10/13/19 0308  NA 136  --  133* 135 137  K 4.7  --  3.7 4.2 4.0  CL 101  --  98 100 106  CO2 25  --  22 24 22   GLUCOSE 374*  --  428* 359* 212*  BUN 14  --  15 16 21   CREATININE 1.16* 1.26* 1.21* 1.17* 1.20*  CALCIUM 9.6  --  9.4 9.6 9.1   GFR: Estimated Creatinine Clearance: 40.4 mL/min (A) (by C-G formula based on SCr of 1.2 mg/dL (H)). Liver Function Tests: Recent Labs  Lab 10/11/19 1048 10/12/19 0500 10/13/19 0308  AST 12* 11* 14*  ALT 10 11 10   ALKPHOS 73 66 50  BILITOT 1.0 0.7 1.1  PROT 8.0 7.5 6.7  ALBUMIN 3.5 3.3* 2.9*   No results for input(s): LIPASE, AMYLASE in the last 168 hours. No results for input(s): AMMONIA in the last 168 hours. Coagulation Profile: Recent Labs  Lab 10/11/19 1048  INR 0.9   Cardiac Enzymes: No results for input(s): CKTOTAL, CKMB, CKMBINDEX, TROPONINI in the last 168 hours. BNP (last 3 results) No results for input(s): PROBNP in the last 8760 hours. HbA1C: Recent Labs    10/12/19 0016  HGBA1C 12.0*   CBG: Recent Labs  Lab 10/12/19 1813 10/12/19 2116 10/13/19 0612 10/13/19 0818 10/13/19 1247  GLUCAP 268* 192* 226* 234* 287*   Lipid Profile: Recent Labs    10/11/19 2224  CHOL 141  HDL 39*  LDLCALC 70  TRIG 159*  CHOLHDL 3.6   Thyroid Function Tests: No results for input(s): TSH, T4TOTAL, FREET4, T3FREE, THYROIDAB in the last 72 hours. Anemia Panel: No results for input(s): VITAMINB12, FOLATE, FERRITIN, TIBC, IRON, RETICCTPCT in the last 72 hours. Sepsis Labs: No results for input(s): PROCALCITON, LATICACIDVEN in the last 168 hours.  Recent Results (from the past 240 hour(s))  SARS CORONAVIRUS 2 (TAT 6-24 HRS) Nasopharyngeal Nasopharyngeal Swab     Status: None   Collection Time: 10/11/19  3:15 PM   Specimen: Nasopharyngeal  Swab  Result Value Ref Range Status   SARS Coronavirus 2 NEGATIVE NEGATIVE Final    Comment: (NOTE) SARS-CoV-2 target nucleic acids are NOT DETECTED. The SARS-CoV-2 RNA is generally detectable in upper and lower respiratory specimens during the acute phase of infection. Negative results do not preclude SARS-CoV-2 infection, do not rule out co-infections with other pathogens, and should not be used as the sole basis for treatment or other patient management decisions. Negative results must be combined with clinical observations, patient history, and epidemiological information. The expected result is Negative. Fact Sheet for Patients: SugarRoll.be Fact Sheet for Healthcare Providers: https://www.woods-mathews.com/ This test is not yet approved or cleared by the [place] FDA and  has been authorized for detection and/or diagnosis of SARS-CoV-2 by FDA under an Emergency Use Authorization (EUA). This EUA will remain  in effect (meaning this test can be used) for the duration of the COVID-19 declaration under Section 56 4(b)(1) of the Act, 21 U.S.C. section 360bbb-3(b)(1), unless the authorization is terminated or revoked sooner. Performed at [facility], [place] [address] [place], [place] [address]          Radiology Studies: CT ANGIO HEAD W OR WO CONTRAST  Result Date: 10/11/2019 CLINICAL DATA:  Stroke follow-up with residual left-sided deficits. EXAM: CT ANGIOGRAPHY HEAD AND NECK TECHNIQUE: Multidetector CT imaging of the head and neck was performed using the standard protocol during bolus administration of intravenous contrast. Multiplanar CT image reconstructions and MIPs were obtained to evaluate the vascular anatomy. Carotid stenosis measurements (when applicable) are obtained utilizing NASCET criteria, using the distal internal carotid diameter as the denominator. CONTRAST:  57mL OMNIPAQUE IOHEXOL 350 MG/ML SOLN COMPARISON:   Head CT 10/11/2019 Brain MRI 10/11/2019 CTA chest abdomen pelvis 03/22/2018 CTA chest 04/28/2017 FINDINGS: CTA NECK FINDINGS SKELETON: There is no bony spinal canal stenosis. No lytic or blastic lesion. OTHER NECK:  Normal pharynx, larynx and major salivary glands. No cervical lymphadenopathy. Unremarkable thyroid gland. UPPER CHEST: Biapical emphysema. AORTIC ARCH: There is extensive calcific atherosclerosis of the aortic arch. There is no aneurysm, dissection or hemodynamically significant stenosis of the visualized portion of the aorta. Aberrant right subclavian artery. 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 Common carotid arteries share a origin. The visualized proximal subclavian arteries are widely patent. RIGHT CAROTID SYSTEM: Multifocal atherosclerosis within the common carotid artery. There is mixed density atherosclerosis at the carotid bifurcation but no hemodynamically significant stenosis. LEFT CAROTID SYSTEM: There is approximately 50% narrowing of the midportion of the common carotid artery. Mixed density plaque extends to the carotid bifurcation but does not cause hemodynamically significant stenosis. VERTEBRAL ARTERIES: Codominant configuration. Both origins are clearly patent. Moderate stenosis of the left V1 segment and left V3 segment. Otherwise, both vertebral arteries are patent to the vertebrobasilar confluence. CTA HEAD FINDINGS POSTERIOR CIRCULATION: --Vertebral arteries: Normal V4 segments. --Inferior cerebellar arteries: Normal. --Basilar artery: Normal. --Superior cerebellar arteries: Normal. --Posterior cerebral arteries (PCA): Normal. The left PCA is predominantly supplied by the posterior communicating artery. ANTERIOR CIRCULATION: --Intracranial internal carotid arteries: Moderate atherosclerotic calcification of the internal carotid arteries at  the skull base with approximately 50% stenosis bilaterally. --Anterior cerebral arteries (ACA): Moderate stenosis of the right A2 segment. Both A1 segments are present. Otherwise normal. --Middle cerebral arteries (MCA): Mild atherosclerotic irregularity of the right M1 segment. Otherwise normal. VENOUS SINUSES: As permitted by contrast timing, patent. ANATOMIC VARIANTS: None Review of the MIP images confirms the above findings. IMPRESSION: 1. No intracranial arterial occlusion or high-grade stenosis. 2. 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 3. Moderate stenosis of the right anterior cerebral artery A2 segment. 4. 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 5. Aortic Atherosclerosis (ICD10-I70.0) and Emphysema (ICD10-J43.9). Electronically Signed   By: Ulyses Jarred M.D.   On: 10/11/2019 23:47   CT ANGIO NECK W OR WO CONTRAST  Result Date: 10/11/2019 CLINICAL DATA:  Stroke follow-up with residual left-sided deficits. EXAM: CT ANGIOGRAPHY HEAD AND NECK TECHNIQUE: Multidetector CT imaging of the head and neck was performed using the standard protocol during bolus administration of intravenous contrast. Multiplanar CT image reconstructions and MIPs were obtained to evaluate the vascular anatomy. Carotid stenosis measurements (when applicable) are obtained utilizing NASCET criteria, using the distal internal carotid diameter as the denominator. CONTRAST:  16mL OMNIPAQUE IOHEXOL 350 MG/ML SOLN COMPARISON:  Head CT 10/11/2019 Brain MRI 10/11/2019 CTA chest abdomen pelvis 03/22/2018 CTA chest 04/28/2017 FINDINGS: CTA NECK FINDINGS SKELETON: There is no bony spinal canal stenosis. No lytic or blastic lesion. OTHER NECK: Normal pharynx, larynx and major salivary glands. No cervical lymphadenopathy. Unremarkable thyroid gland. UPPER CHEST: Biapical emphysema. AORTIC ARCH: There is extensive calcific atherosclerosis of the aortic arch. There is no  aneurysm, dissection or hemodynamically significant stenosis of the visualized portion of the aorta. Aberrant right subclavian artery. 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 Common carotid arteries share a origin. The visualized proximal subclavian arteries are widely patent. RIGHT CAROTID SYSTEM: Multifocal atherosclerosis within the common carotid artery. There is mixed density atherosclerosis at the carotid bifurcation but no hemodynamically significant stenosis. LEFT CAROTID SYSTEM: There is approximately 50% narrowing of the midportion of the  common carotid artery. Mixed density plaque extends to the carotid bifurcation but does not cause hemodynamically significant stenosis. VERTEBRAL ARTERIES: Codominant configuration. Both origins are clearly patent. Moderate stenosis of the left V1 segment and left V3 segment. Otherwise, both vertebral arteries are patent to the vertebrobasilar confluence. CTA HEAD FINDINGS POSTERIOR CIRCULATION: --Vertebral arteries: Normal V4 segments. --Inferior cerebellar arteries: Normal. --Basilar artery: Normal. --Superior cerebellar arteries: Normal. --Posterior cerebral arteries (PCA): Normal. The left PCA is predominantly supplied by the posterior communicating artery. ANTERIOR CIRCULATION: --Intracranial internal carotid arteries: Moderate atherosclerotic calcification of the internal carotid arteries at the skull base with approximately 50% stenosis bilaterally. --Anterior cerebral arteries (ACA): Moderate stenosis of the right A2 segment. Both A1 segments are present. Otherwise normal. --Middle cerebral arteries (MCA): Mild atherosclerotic irregularity of the right M1 segment. Otherwise normal. VENOUS SINUSES: As permitted by contrast timing, patent. ANATOMIC VARIANTS: None Review of the MIP images confirms the above findings. IMPRESSION:  1. No intracranial arterial occlusion or high-grade stenosis. 2. 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 3. Moderate stenosis of the right anterior cerebral artery A2 segment. 4. 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 5. Aortic Atherosclerosis (ICD10-I70.0) and Emphysema (ICD10-J43.9). Electronically Signed   By: Ulyses Jarred M.D.   On: 10/11/2019 23:47   ECHOCARDIOGRAM COMPLETE  Result Date: 10/12/2019    ECHOCARDIOGRAM REPORT   Patient Name:   [name] Date of Exam: 10/12/2019 Medical Rec #:  [id number]      Height:       67.5 in Accession #:    [id number]     Weight:       170.0 lb Date of Birth:  [birth year]       BSA:          1.898 m Patient Age:    31 years       BP:           177/73 mmHg Patient Gender: F              HR:           67 bpm. Exam Location:  Inpatient Procedure: 2D Echo, Cardiac Doppler and Color Doppler Indications:    Stroke 434.91 / I163.9, R/O PFO  History:        Patient has prior history of Echocardiogram examinations, most                 recent 12/19/2014. CAD and Previous Myocardial Infarction,                 Stroke; Risk Factors:Hypertension, Diabetes and Dyslipidemia.  Sonographer:    Alvino Chapel RCS Referring Phys: [id number] [place]  1. Left ventricular ejection fraction, by estimation, is 45%. The left ventricle has mildly decreased function. The left ventricle demonstrates global hypokinesis. There is mild left ventricular hypertrophy. Left ventricular diastolic parameters are consistent with Grade I diastolic dysfunction (impaired relaxation).  2. The mitral valve is degenerative, mildly calcified with moderate annular calcification. Trivial mitral valve regurgitation.  3. The aortic valve is tricuspid. Aortic valve regurgitation is not visualized.  4. The inferior vena cava is normal in size with greater than 50% respiratory variability, suggesting right atrial  pressure of 3 mmHg.  5. Right ventricular systolic function is normal. The right ventricular size is normal. Tricuspid regurgitation signal is inadequate for assessing PA pressure. FINDINGS  Left Ventricle: Left ventricular ejection fraction, by estimation, is 45%. The left ventricle  has mildly decreased function. The left ventricle demonstrates global hypokinesis. The left ventricular internal cavity size was normal in size. There is mild left ventricular hypertrophy. Left ventricular diastolic parameters are consistent with Grade I diastolic dysfunction (impaired relaxation). Right Ventricle: The right ventricular size is normal. No increase in right ventricular wall thickness. Right ventricular systolic function is normal. Tricuspid regurgitation signal is inadequate for assessing PA pressure. Left Atrium: Left atrial size was normal in size. Right Atrium: Right atrial size was normal in size. Pericardium: There is no evidence of pericardial effusion. Mitral Valve: The mitral valve is degenerative in appearance. There is mild calcification of the mitral valve leaflet(s). Mild to moderate mitral annular calcification. Trivial mitral valve regurgitation. Tricuspid Valve: The tricuspid valve is grossly normal. Tricuspid valve regurgitation is trivial. Aortic Valve: The aortic valve is tricuspid. Aortic valve regurgitation is not visualized. Mild aortic valve annular calcification. Pulmonic Valve: The pulmonic valve was grossly normal. Pulmonic valve regurgitation is trivial. Aorta: The aortic root is normal in size and structure. Venous: The inferior vena cava is normal in size with greater than 50% respiratory variability, suggesting right atrial pressure of 3 mmHg. IAS/Shunts: No atrial level shunt detected by color flow Doppler.  LEFT VENTRICLE PLAX 2D LVIDd:         3.40 cm     Diastology LVIDs:         2.60 cm     LV e' lateral:   6.42 cm/s LV PW:         1.20 cm     LV E/e' lateral: 6.1 LV IVS:        1.30 cm      LV e' medial:    4.03 cm/s                            LV E/e' medial:  9.8  LV Volumes (MOD) LV vol d, MOD A2C: 35.5 ml LV vol d, MOD A4C: 56.0 ml LV vol s, MOD A2C: 22.6 ml LV vol s, MOD A4C: 24.7 ml LV SV MOD A2C:     12.9 ml LV SV MOD A4C:     56.0 ml LV SV MOD BP:      22.7 ml RIGHT VENTRICLE RV S prime:     7.40 cm/s TAPSE (M-mode): 1.9 cm LEFT ATRIUM           Index       RIGHT ATRIUM           Index LA diam:      2.90 cm 1.53 cm/m  RA Area:     14.30 cm LA Vol (A2C): 32.7 ml 17.23 ml/m RA Volume:   35.30 ml  18.60 ml/m LA Vol (A4C): 23.1 ml 12.17 ml/m  AORTIC VALVE LVOT Vmax:   64.20 cm/s LVOT Vmean:  40.800 cm/s LVOT VTI:    0.101 m  AORTA Ao Root diam: 2.80 cm MITRAL VALVE MV Area (PHT): 1.66 cm    SHUNTS MV Decel Time: 456 msec    Systemic VTI: 0.10 m MV E velocity: 39.40 cm/s MV A velocity: 90.80 cm/s MV E/A ratio:  0.43 Rozann Lesches MD Electronically signed by Rozann Lesches MD Signature Date/Time: 10/12/2019/2:37:06 PM    Final         Scheduled Meds: . amLODipine  5 mg Oral Daily  . aspirin EC  81 mg Oral Daily  . atorvastatin  40 mg Oral Daily  .  clopidogrel  75 mg Oral Daily  . heparin  5,000 Units Subcutaneous Q8H  . insulin aspart  0-15 Units Subcutaneous TID WC  . insulin aspart  0-5 Units Subcutaneous QHS  . [START ON 10/14/2019] insulin glargine  14 Units Subcutaneous Daily  . isosorbide mononitrate  60 mg Oral Daily  . metoprolol succinate  100 mg Oral Daily  . multivitamin with minerals  1 tablet Oral Daily  . nicotine  14 mg Transdermal Daily  . pantoprazole  40 mg Oral Daily  . Ensure Max Protein  11 oz Oral BID  . sodium chloride flush  10-40 mL Intracatheter Q12H  . spironolactone  25 mg Oral Daily   Continuous Infusions:   Time spent: 25 minutes with over 50% of the time coordinating the patient's care    Harold Hedge, DO [facility] Pager [phone]  Call night coverage person covering after 7pm

## 2019-10-13 NOTE — TOC Progression Note (Signed)
Transition of Care [facility]) - Progression Note    Patient Details  Name: [name] MRN: [id number] Date of Birth: [birth year]  [facility]) CM/SW Contact  Pollie Friar, RN Phone Number: 10/13/2019, 10:57 AM  Clinical Narrative:    CM has met with the patient and she has decided she does want to go to rehab prior to home. She is agreeable to being faxed out in the [facility] area. CM will provide her bed offers later today and then insurance will need to be started.  TOC following.   Expected Discharge Plan: Skilled Nursing Facility Barriers to Discharge: Continued Medical Work up  Expected Discharge Plan and Services Expected Discharge Plan: [place]   Discharge Planning Services: CM Consult Post Acute Care Choice: [place] arrangements for the past 2 months: Single Family Home                   DME Agency: [facility] Date DME Agency Contacted: (Will need ordered tomorrow when room number known 3:1)     HH Arranged: PT, OT, Nurse's Aide HH Agency: [facility] [place] Date [facility] Agency Contacted: 10/12/19 Time HH Agency Contacted: [id number](plan for going to home tuesday wont know for sure until they can call in monday to find out) Representative spoke with at [place]: [place] Determinants of Health (SDOH) Interventions    Readmission Risk Interventions No flowsheet data found.

## 2019-10-13 NOTE — Progress Notes (Signed)
Initial Nutrition Assessment  DOCUMENTATION CODES:   Not applicable  INTERVENTION: -Ensure Max po BID, each supplement provides 150 kcal and 30 grams of protein.  -MVI daily -Snacks BID  NUTRITION DIAGNOSIS:   Inadequate oral intake related to poor appetite as evidenced by per patient/family report.    GOAL:   Patient will meet greater than or equal to 90% of their needs    MONITOR:   PO intake, Supplement acceptance, Weight trends, Labs, I & O's  REASON FOR ASSESSMENT:   Malnutrition Screening Tool    ASSESSMENT:   Pt with a PMH significant for HTN, DM, CAD with inferior STEMI s/p PCI, PVD s/p abdominal aneurysm, left femoral-popliteal bypass surgery, frequent TIAs and stroke admitted for stroke.  Pt reports appetite has been poor for the last 6 months and that she has been having to force herself to eat 2 meals per day but wasn't able to eat full portions due to her decreased appetite. Pt also reports forgetting to eat frequently over the last 6 months.   After further discussions with pt, she revealed that she has had 3 children die, with her son dying only 3 months ago. Pt reports feeling depressed and feels as though this is why she has not had an appetite. Pt became very upset and tearful. Provided comfort.   Pt reports unintentional wt loss over the last 6 months but is unsure of how much. Per wt readings, pt with 4.2% wt loss x6 months, which is insignificant for time frame.  No PO intake documented.   Labs: CBGs [phone] (Diabetes Coordinator following) Medications reviewed and include: Novolog, Lantus, Aldactone  NUTRITION - FOCUSED PHYSICAL EXAM:  Deferred due to pt's emotional state, will attempt at follow-up.   Diet Order:   Diet Order            Diet Carb Modified Fluid consistency: Thin; Room service appropriate? Yes  Diet effective now              EDUCATION NEEDS:   No education needs have been identified at this time  Skin:  Skin  Assessment: Reviewed RN Assessment  Last BM:  5/15  Height:   Ht Readings from Last 1 Encounters:  10/11/19 5' 7.5" (1.715 m)    Weight:   Wt Readings from Last 6 Encounters:  10/11/19 77.1 kg  07/24/19 77.1 kg  06/25/19 78 kg  06/19/19 80.7 kg  04/08/19 80.7 kg  07/16/18 76.2 kg    BMI:  Body mass index is 26.23 kg/m.  Estimated Nutritional Needs:   Kcal:  1700-1900  Protein:  85-95 grams  Fluid:  >1.7L/d   Larkin Ina, MS, RD, LDN RD pager number and weekend/on-call pager number located in [place].

## 2019-10-13 NOTE — Progress Notes (Signed)
Physical Therapy Treatment Patient Details Name: [name] MRN: [id number] DOB: 11/04/1940 Today's Date: 10/13/2019    History of Present Illness 79 y.o. female with HTN, DM,  CAD with inferior STEMI s/p PCI to dRCA 03/2014, PVD s/p abdominal aneurysm repair-08/2005, Left Femoral-popliteal bypass surgery-07/06/2014, frequent TIAs- since 2002 and strokes in past has chronic speech and left sided weakness who presented to the ED with worsening left-sided weakness since a.m. 10/11/19. MRI with 3-4 mm acute infarction in the posterior limb internal capsule on the right. Admitted for treatment of CVA.     PT Comments    Patient seen for mobility progression. Pt very pleasant and eager to participate. Patient presents with L side weakness and requires mod A for gait training. Pt with multiple LOB during session requiring assist to prevent falls. Pt realizing extent of deficits today and is agreeable to rehab before returning home. Continue to recommend further skilled PT services in both acute and post acute settings to maximize independence and safety with mobility.    Follow Up Recommendations  SNF     Equipment Recommendations  Other (comment)(TBD at next venue)    Recommendations for Other Services OT consult     Precautions / Restrictions Precautions Precautions: Fall Restrictions Weight Bearing Restrictions: No    Mobility  Bed Mobility Overal bed mobility: Needs Assistance Bed Mobility: Supine to Sit     Supine to sit: Min guard     General bed mobility comments: use of rail; min guard for safety  Transfers Overall transfer level: Needs assistance Equipment used: Rolling walker (2 wheeled) Transfers: Sit to/from Stand Sit to Stand: Min assist         General transfer comment: assist to power up and for balance from EOB and [facility]  Ambulation/Gait Ambulation/Gait assistance: Mod assist Gait Distance (Feet): 60 Feet Assistive device: 1 person hand held assist(assist  at trunk with gait belt) Gait Pattern/deviations: Step-through pattern;Decreased step length - right;Decreased step length - left;Decreased weight shift to left;Decreased dorsiflexion - left;Decreased stance time - left Gait velocity: decreased   General Gait Details: assistance required for balance and weight shifting; pt with multiple LOB requiring assist to prevent falls; cues for "heel to toe" on L side and pt able to improve with concentration however pt distracted easily; increased difficulty with turning    Stairs             Wheelchair Mobility    Modified Rankin (Stroke Patients Only) Modified Rankin (Stroke Patients Only) Pre-Morbid Rankin Score: No significant disability Modified Rankin: Moderately severe disability     Balance Overall balance assessment: Needs assistance Sitting-balance support: Feet supported;No upper extremity supported Sitting balance-Leahy Scale: Fair Sitting balance - Comments: able to don/doff shoes in recliner with supervision   Standing balance support: During functional activity;Single extremity supported Standing balance-Leahy Scale: Poor Standing balance comment: preference to BUE support dyanimcally, able to groom at sink with min guard without UE support                            Cognition Arousal/Alertness: Awake/alert Behavior During Therapy: WFL for tasks assessed/performed Overall Cognitive Status: No family/caregiver present to determine baseline cognitive functioning Area of Impairment: Safety/judgement;Memory;Awareness;Problem solving;Attention                   Current Attention Level: Sustained Memory: Decreased short-term memory   Safety/Judgement: Decreased awareness of safety Awareness: Emergent Problem Solving: Requires verbal cues General Comments:  pt realizing her deficits during session and reports "i can't go home like this!"      Exercises      General Comments        Pertinent  Vitals/Pain Pain Assessment: No/denies pain    Home Living Family/patient expects to be discharged to:: Private residence Living Arrangements: Other (Comment)(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 ) Available Help at Discharge: Family;Available PRN/intermittently Type of Home: House Home Access: Ramped entrance   Home Layout: One level Home Equipment: Grab bars - tub/shower;Shower seat;Shower seat - built in;Walker - 4 wheels;Electric scooter      Prior Function Level of Independence: Independent with assistive device(s)      Comments: uses [employer], 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 since son's death earlier this year, drives to grocery store and church    PT Goals (current goals can now be found in the care plan section) Acute Rehab PT Goals Patient Stated Goal: to get to rehab and then home  Progress towards PT goals: Progressing toward goals    Frequency    Min 4X/week      PT Plan Current plan remains appropriate    Co-evaluation              AM-PAC PT "6 Clicks" Mobility   Outcome Measure  Help needed turning from your back to your side while in a flat bed without using bedrails?: None Help needed moving from lying on your back to sitting on the side of a flat bed without using bedrails?: A Little Help needed moving to and from a bed to a chair (including a wheelchair)?: A Little Help needed standing up from a chair using your arms (e.g., wheelchair or bedside chair)?: A Little Help needed to walk in hospital room?: A Lot Help needed climbing 3-5 steps with a railing? : A Lot 6 Click Score: 17    End of Session Equipment Utilized During Treatment: Gait belt Activity Tolerance: Patient tolerated treatment well Patient left: with call bell/phone within reach;in chair;with chair alarm set Nurse Communication: Mobility status PT Visit Diagnosis: Unsteadiness on feet (R26.81);Other abnormalities of gait and mobility (R26.89);Muscle weakness (generalized)  (M62.81);Difficulty in walking, not elsewhere classified (R26.2);Other symptoms and signs involving the nervous system (R29.898);Hemiplegia and hemiparesis Hemiplegia - Right/Left: Left Hemiplegia - dominant/non-dominant: Non-dominant Hemiplegia - caused by: Cerebral infarction     Time: 0623-7628 PT Time Calculation (min) (ACUTE ONLY): 40 min  Charges:  $Gait Training: 23-37 mins                     Earney Navy, PTA Acute Rehabilitation Services Pager: [phone] Office: [phone]     Darliss Cheney 10/13/2019, 1:36 PM

## 2019-10-14 LAB — COMPREHENSIVE METABOLIC PANEL
ALT: 10 U/L (ref 0–44)
AST: 17 U/L (ref 15–41)
Albumin: 2.9 g/dL — ABNORMAL LOW (ref 3.5–5.0)
Alkaline Phosphatase: 45 U/L (ref 38–126)
Anion gap: 11 (ref 5–15)
BUN: 30 mg/dL — ABNORMAL HIGH (ref 8–23)
CO2: 23 mmol/L (ref 22–32)
Calcium: 9.1 mg/dL (ref 8.9–10.3)
Chloride: 104 mmol/L (ref 98–111)
Creatinine, Ser: 1.58 mg/dL — ABNORMAL HIGH (ref 0.44–1.00)
GFR calc Af Amer: 35 mL/min — ABNORMAL LOW (ref >60)
GFR calc non Af Amer: 31 mL/min — ABNORMAL LOW (ref >60)
Glucose, Bld: 134 mg/dL — ABNORMAL HIGH (ref 70–99)
Potassium: 4 mmol/L (ref 3.5–5.1)
Sodium: 138 mmol/L (ref 135–145)
Total Bilirubin: 0.9 mg/dL (ref 0.3–1.2)
Total Protein: 6.4 g/dL — ABNORMAL LOW (ref 6.5–8.1)

## 2019-10-14 LAB — CBC
HCT: 36.8 % (ref 36.0–46.0)
Hemoglobin: 12.1 g/dL (ref 12.0–15.0)
MCH: 29.2 pg (ref 26.0–34.0)
MCHC: 32.9 g/dL (ref 30.0–36.0)
MCV: 88.7 fL (ref 80.0–100.0)
Platelets: 159 10*3/uL (ref 150–400)
RBC: 4.15 MIL/uL (ref 3.87–5.11)
RDW: 14.6 % (ref 11.5–15.5)
WBC: 6.2 10*3/uL (ref 4.0–10.5)
nRBC: 0 % (ref 0.0–0.2)

## 2019-10-14 LAB — GLUCOSE, CAPILLARY
Glucose-Capillary: 155 mg/dL — ABNORMAL HIGH (ref 70–99)
Glucose-Capillary: 211 mg/dL — ABNORMAL HIGH (ref 70–99)
Glucose-Capillary: 162 mg/dL — ABNORMAL HIGH (ref 70–99)
Glucose-Capillary: 246 mg/dL — ABNORMAL HIGH (ref 70–99)

## 2019-10-14 NOTE — Progress Notes (Signed)
Physical Therapy Treatment Patient Details Name: [name] MRN: [id number] DOB: [birth year] Today's Date: 10/14/2019    History of Present Illness 79 y.o. female with HTN, DM,  CAD with inferior STEMI s/p PCI to dRCA 03/2014, PVD s/p abdominal aneurysm repair-08/2005, Left Femoral-popliteal bypass surgery-07/06/2014, frequent TIAs- since 2002 and strokes in past has chronic speech and left sided weakness who presented to the ED with worsening left-sided weakness since a.m. 10/11/19. MRI with 3-4 mm acute infarction in the posterior limb internal capsule on the right. Admitted for treatment of CVA.     PT Comments    Patient seen for mobility progression. Continue to progress as tolerated with anticipated d/c to SNF for further skilled PT services.     Follow Up Recommendations  SNF     Equipment Recommendations  Other (comment)(TBD at next venue)    Recommendations for Other Services OT consult     Precautions / Restrictions Precautions Precautions: Fall Restrictions Weight Bearing Restrictions: No    Mobility  Bed Mobility Overal bed mobility: Needs Assistance Bed Mobility: Supine to Sit     Supine to sit: Min guard     General bed mobility comments: use of rail; min guard for safety  Transfers Overall transfer level: Needs assistance Equipment used: 1 person hand held assist Transfers: Sit to/from Stand Sit to Stand: Min assist;Mod assist         General transfer comment: intial stand mod A to power up and min A second trial; cues for use of L UE   Ambulation/Gait Ambulation/Gait assistance: Mod assist;Min assist Gait Distance (Feet): 60 Feet Assistive device: 1 person hand held assist;Rolling walker (2 wheeled) Gait Pattern/deviations: Step-through pattern;Decreased step length - right;Decreased step length - left;Decreased dorsiflexion - left;Decreased stance time - left Gait velocity: decreased   General Gait Details: initially ambulating without AD and  then pt requesting use of RW as she was reaching for objects to hold to for balance; with RW pt requires assistance for balance and managing RW due to tendency pull to R side; cues for increased L step length/height and taking time for improved steps when turning   Stairs             Wheelchair Mobility    Modified Rankin (Stroke Patients Only) Modified Rankin (Stroke Patients Only) Pre-Morbid Rankin Score: No significant disability Modified Rankin: Moderately severe disability     Balance Overall balance assessment: Needs assistance Sitting-balance support: Feet supported;No upper extremity supported Sitting balance-Leahy Scale: Fair     Standing balance support: During functional activity;Single extremity supported Standing balance-Leahy Scale: Poor                              Cognition Arousal/Alertness: Awake/alert Behavior During Therapy: WFL for tasks assessed/performed Overall Cognitive Status: No family/caregiver present to determine baseline cognitive functioning Area of Impairment: Safety/judgement;Memory                     Memory: Decreased short-term memory   Safety/Judgement: Decreased awareness of safety;Decreased awareness of deficits            Exercises      General Comments        Pertinent Vitals/Pain Pain Assessment: No/denies pain    Home Living                      Prior Function  PT Goals (current goals can now be found in the care plan section) Progress towards PT goals: Progressing toward goals    Frequency    Min 4X/week      PT Plan Current plan remains appropriate    Co-evaluation              AM-PAC PT "6 Clicks" Mobility   Outcome Measure  Help needed turning from your back to your side while in a flat bed without using bedrails?: None Help needed moving from lying on your back to sitting on the side of a flat bed without using bedrails?: A Little Help needed  moving to and from a bed to a chair (including a wheelchair)?: A Little Help needed standing up from a chair using your arms (e.g., wheelchair or bedside chair)?: A Little Help needed to walk in hospital room?: A Lot Help needed climbing 3-5 steps with a railing? : A Lot 6 Click Score: 17    End of Session Equipment Utilized During Treatment: Gait belt Activity Tolerance: Patient tolerated treatment well Patient left: with call bell/phone within reach;in chair;with chair alarm set Nurse Communication: Mobility status PT Visit Diagnosis: Unsteadiness on feet (R26.81);Other abnormalities of gait and mobility (R26.89);Muscle weakness (generalized) (M62.81);Difficulty in walking, not elsewhere classified (R26.2);Other symptoms and signs involving the nervous system (R29.898);Hemiplegia and hemiparesis Hemiplegia - Right/Left: Left Hemiplegia - dominant/non-dominant: Non-dominant Hemiplegia - caused by: Cerebral infarction     Time: 1519-1550 PT Time Calculation (min) (ACUTE ONLY): 31 min  Charges:  $Gait Training: 23-37 mins                     Earney Navy, PTA Acute Rehabilitation Services Pager: [phone] Office: [phone]     Darliss Cheney 10/14/2019, 6:23 PM

## 2019-10-14 NOTE — Progress Notes (Signed)
PROGRESS NOTE    [name]    Code Status: Full Code  AOZ:[id number] DOB: Jul 26, 1940 DOA: 10/11/2019 LOS: 3 days  PCP: [name], MD CC:  Chief Complaint  Patient presents with  . Weakness       Hospital Summary   This is an 79 year old female with past medical history of hypertension, diabetes, CAD with inferior STEMI s/p PCI in 2015, tobacco abuse, PVD s/p abdominal aneurysm repair in 2007, left femoral-popliteal bypass surgery in 2016, frequent TIAs and strokes in the past with some residual left-sided weakness who presented to the ED with worsening left-sided weakness since morning of admission.  ED course: Vitals 174/74, HR 50s to 60s.  WBC 6, Hb 13.6, platelets 166, N 136, K4.7, glucose 324, Cr 1.16 Head CT unremarkable however MRI brain with 3 to 4 mm acute infarct in posterior limb of the internal capsule on the right.  Received Plavix load.  Seen by neurology.  Did not receive TPA as she was outside therapeutic window.  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 Started on dual antiplatelets   5/16: Multiple long discussions with the patient in the ED as she was becoming verbally aggressive with staff and stated that she wanted to leave.  After multiple discussions with the patient and daughter, it was thought that the patient was suffering from nicotine withdrawal and was eventually willing to stay overnight with a nicotine patch for TOC to arrange safe dispo with home health.  5/17: patient now amenable to going to SNF. TOC notified of change   A & P   Active Problems:   Hypertension   DM (diabetes mellitus) type II uncontrolled, periph vascular disorder (HCC)   Coronary artery disease involving native coronary artery of native heart with angina pectoris ([place])   CVA (cerebral vascular  accident) ([place])   1. Acute stroke, right-sideded 3 to 4 mm infarction of posterior limb of internal capsule a. Residual left upper and lower extremity weakness b. Multiple risk factors: Previous stroke, hypertension, hyperlipidemia, tobacco abuse, poorly controlled diabetes, advanced age, family history of stroke c. 10/12/2019 Echo: Grade 1 diastolic dysfunction with EF 45% and global hypokinesis -previous echo in 2016 with EF 45 to 50%, mild LVH and mild hypokinesis d. Aggressive risk factor modification: Lipitor increased to 40 mg daily, diabetic coordinator consulted e. Continue Aspirin/Plavix indefinitely with outpatient neuro follow up f. Initially refusing SNF but now amenable to going, TOC team notified  2. Hypertension a. On Norvasc, Imdur and Toprol-XL  3. Hyperlipidemia/aortic atherosclerosis a. LDL 70 on Lipitor 20 mg daily. Increased to Lipitor 40 mg daily for goal <70  4. Tobacco abuse with nicotine withdrawal a. nicotine patch  5. Poorly controlled type 2 diabetes a. HA1C 12.0 b. Has not been taking any medications for the past 3 months, previously on glimepiride c. Lantus 14 units daily, novolog 3 units TID with meals and sliding scale d. Diabetic coordinator consulted - needs meter at discharge and patient does not want to start on insulin, prefers oral therapy at discharge.  Previously with GI upset on Metformin.  Likely just restart her glimepiride which she tolerated before needs close outpatient follow up   6. GERD on Nexium  7. CKD 3b, at baseline   DVT prophylaxis: Heparin Family Communication: Patient's daughter has been updated yesterday Disposition Plan:  Status is: Inpatient  Remains inpatient appropriate because:Unsafe d/c plan   Dispo: The patient is from: Home              Anticipated d/c is to: SNF              Anticipated d/c date is: 1 day              Patient currently is medically stable to d/c.      Pressure injury documentation     None  Consultants  Neurology   Procedures  None  Antibiotics   Anti-infectives (From admission, onward)   None        Subjective   States she is feeling stronger every day.  No complaints, no overnight events  Objective   Vitals:   10/14/19 0031 10/14/19 0339 10/14/19 0821 10/14/19 1233  BP: 122/62 135/65 (!) 154/77 120/74  Pulse: (!) 57 (!) 56 63 72  Resp: 17 16 20 20   Temp: (!) 97.4 F (36.3 C) (!) 97.5 F (36.4 C) 98.1 F (36.7 C) 97.9 F (36.6 C)  TempSrc: Oral Oral Oral Oral  SpO2: 99% 96% 99% 100%  Weight:      Height:        Intake/Output Summary (Last 24 hours) at 10/14/2019 1349 Last data filed at 10/14/2019 1238 Gross per 24 hour  Intake 870 ml  Output --  Net 870 ml   Filed Weights   10/11/19 1037  Weight: 77.1 kg    Examination:  Physical Exam Vitals and nursing note reviewed.  Constitutional:      Appearance: Normal appearance.  HENT:     Head: Normocephalic and atraumatic.  Eyes:     Conjunctiva/sclera: Conjunctivae normal.  Cardiovascular:     Rate and Rhythm: Normal rate and regular rhythm.  Pulmonary:     Effort: Pulmonary effort is normal.     Breath sounds: Normal breath sounds.  Abdominal:     General: Abdomen is flat.     Palpations: Abdomen is soft.  Musculoskeletal:        General: No swelling or tenderness.  Skin:    Coloration: Skin is not jaundiced or pale.  Neurological:     Mental Status: She is alert.     Comments: Decreased left grip strength  Psychiatric:        Mood and Affect: Mood normal.        Behavior: Behavior normal.     Data Reviewed: I have personally reviewed following labs and imaging studies  CBC: Recent Labs  Lab 10/11/19 1048 10/12/19 0500 10/13/19 0308 10/14/19 0333  WBC 6.4 7.3 6.5 6.2  NEUTROABS 4.0  --   --   --   HGB 13.6 13.0 12.9 12.1  HCT 42.3 39.9 39.0 36.8  MCV 89.8 88.9 88.6 88.7  PLT 166 157 148* 124   Basic Metabolic Panel: Recent Labs  Lab 10/11/19 1048  10/11/19 1048 10/11/19 2224 10/12/19 0010 10/12/19 0500 10/13/19 0308 10/14/19 0333  NA 136  --   --  133* 135 137 138  K 4.7  --   --  3.7 4.2 4.0 4.0  CL 101  --   --  98 100 106 104  CO2 25  --   --  22 24 22 23   GLUCOSE 374*  --   --  428* 359* 212* 134*  BUN 14  --   --  15 16 21  30*  CREATININE 1.16*   < > 1.26* 1.21* 1.17* 1.20* 1.58*  CALCIUM 9.6  --   --  9.4 9.6 9.1 9.1   < > = values in this interval not displayed.   GFR: Estimated Creatinine Clearance: 30.7 mL/min (A) (by C-G formula based on SCr of 1.58 mg/dL (H)). Liver Function Tests: Recent Labs  Lab 10/11/19 1048 10/12/19 0500 10/13/19 0308 10/14/19 0333  AST 12* 11* 14* 17  ALT 10 11 10 10   ALKPHOS 73 66 50 45  BILITOT 1.0 0.7 1.1 0.9  PROT 8.0 7.5 6.7 6.4*  ALBUMIN 3.5 3.3* 2.9* 2.9*   No results for input(s): LIPASE, AMYLASE in the last 168 hours. No results for input(s): AMMONIA in the last 168 hours. Coagulation Profile: Recent Labs  Lab 10/11/19 1048  INR 0.9   Cardiac Enzymes: No results for input(s): CKTOTAL, CKMB, CKMBINDEX, TROPONINI in the last 168 hours. BNP (last 3 results) No results for input(s): PROBNP in the last 8760 hours. HbA1C: Recent Labs    10/12/19 0016  HGBA1C 12.0*   CBG: Recent Labs  Lab 10/13/19 1247 10/13/19 1718 10/13/19 2121 10/14/19 0645 10/14/19 1203  GLUCAP 287* 322* 136* 155* 211*   Lipid Profile: Recent Labs    10/11/19 2224  CHOL 141  HDL 39*  LDLCALC 70  TRIG 159*  CHOLHDL 3.6   Thyroid Function Tests: No results for input(s): TSH, T4TOTAL, FREET4, T3FREE, THYROIDAB in the last 72 hours. Anemia Panel: No results for input(s): VITAMINB12, FOLATE, FERRITIN, TIBC, IRON, RETICCTPCT in the last 72 hours. Sepsis Labs: No results for input(s): PROCALCITON, LATICACIDVEN in the last 168 hours.  Recent Results (from the past 240 hour(s))  SARS CORONAVIRUS 2 (TAT 6-24 HRS) Nasopharyngeal Nasopharyngeal Swab     Status: None   Collection Time:  10/11/19  3:15 PM   Specimen: Nasopharyngeal Swab  Result Value Ref Range Status   SARS Coronavirus 2 NEGATIVE NEGATIVE Final    Comment: (NOTE) SARS-CoV-2 target nucleic acids are NOT DETECTED. The SARS-CoV-2 RNA is generally detectable in upper and lower respiratory specimens during the acute phase of infection. Negative results do not preclude SARS-CoV-2 infection, do not rule out co-infections with other pathogens, and should not be used as the sole basis for treatment or other patient management decisions. Negative results must be combined with clinical observations, patient history, and epidemiological information. The expected result is Negative. Fact Sheet for Patients: SugarRoll.be Fact Sheet for Healthcare Providers: https://www.woods-mathews.com/ This test is not yet approved or cleared by the [place] FDA and  has been authorized for detection and/or diagnosis of SARS-CoV-2 by FDA under an Emergency Use Authorization (EUA). This EUA will remain  in effect (meaning this test can be used) for the duration of the COVID-19 declaration under Section 56 4(b)(1) of the Act, 21 U.S.C. section 360bbb-3(b)(1), unless the authorization is terminated or revoked sooner. Performed at [facility], [place] [address] [place], [place] [address]          Radiology Studies: ECHOCARDIOGRAM COMPLETE  Result Date: 10/12/2019    ECHOCARDIOGRAM REPORT   Patient Name:   [name] Date of Exam: 10/12/2019 Medical Rec #:  [id number]      Height:       67.5 in Accession #:    [id number]     Weight:       170.0 lb Date of Birth:  08/10/1940       BSA:          1.898 m Patient Age:    66 years  BP:           177/73 mmHg Patient Gender: F              HR:           67 bpm. Exam Location:  Inpatient Procedure: 2D Echo, Cardiac Doppler and Color Doppler Indications:    Stroke 434.91 / I163.9, R/O PFO  History:        Patient has prior history of  Echocardiogram examinations, most                 recent 12/19/2014. CAD and Previous Myocardial Infarction,                 Stroke; Risk Factors:Hypertension, Diabetes and Dyslipidemia.  Sonographer:    Alvino Chapel RCS Referring Phys: [id number] [place]  1. Left ventricular ejection fraction, by estimation, is 45%. The left ventricle has mildly decreased function. The left ventricle demonstrates global hypokinesis. There is mild left ventricular hypertrophy. Left ventricular diastolic parameters are consistent with Grade I diastolic dysfunction (impaired relaxation).  2. The mitral valve is degenerative, mildly calcified with moderate annular calcification. Trivial mitral valve regurgitation.  3. The aortic valve is tricuspid. Aortic valve regurgitation is not visualized.  4. The inferior vena cava is normal in size with greater than 50% respiratory variability, suggesting right atrial pressure of 3 mmHg.  5. Right ventricular systolic function is normal. The right ventricular size is normal. Tricuspid regurgitation signal is inadequate for assessing PA pressure. FINDINGS  Left Ventricle: Left ventricular ejection fraction, by estimation, is 45%. The left ventricle has mildly decreased function. The left ventricle demonstrates global hypokinesis. The left ventricular internal cavity size was normal in size. There is mild left ventricular hypertrophy. Left ventricular diastolic parameters are consistent with Grade I diastolic dysfunction (impaired relaxation). Right Ventricle: The right ventricular size is normal. No increase in right ventricular wall thickness. Right ventricular systolic function is normal. Tricuspid regurgitation signal is inadequate for assessing PA pressure. Left Atrium: Left atrial size was normal in size. Right Atrium: Right atrial size was normal in size. Pericardium: There is no evidence of pericardial effusion. Mitral Valve: The mitral valve is degenerative in  appearance. There is mild calcification of the mitral valve leaflet(s). Mild to moderate mitral annular calcification. Trivial mitral valve regurgitation. Tricuspid Valve: The tricuspid valve is grossly normal. Tricuspid valve regurgitation is trivial. Aortic Valve: The aortic valve is tricuspid. Aortic valve regurgitation is not visualized. Mild aortic valve annular calcification. Pulmonic Valve: The pulmonic valve was grossly normal. Pulmonic valve regurgitation is trivial. Aorta: The aortic root is normal in size and structure. Venous: The inferior vena cava is normal in size with greater than 50% respiratory variability, suggesting right atrial pressure of 3 mmHg. IAS/Shunts: No atrial level shunt detected by color flow Doppler.  LEFT VENTRICLE PLAX 2D LVIDd:         3.40 cm     Diastology LVIDs:         2.60 cm     LV e' lateral:   6.42 cm/s LV PW:         1.20 cm     LV E/e' lateral: 6.1 LV IVS:        1.30 cm     LV e' medial:    4.03 cm/s                            LV E/e' medial:  9.8  LV Volumes (MOD) LV vol d, MOD A2C: 35.5 ml LV vol d, MOD A4C: 56.0 ml LV vol s, MOD A2C: 22.6 ml LV vol s, MOD A4C: 24.7 ml LV SV MOD A2C:     12.9 ml LV SV MOD A4C:     56.0 ml LV SV MOD BP:      22.7 ml RIGHT VENTRICLE RV S prime:     7.40 cm/s TAPSE (M-mode): 1.9 cm LEFT ATRIUM           Index       RIGHT ATRIUM           Index LA diam:      2.90 cm 1.53 cm/m  RA Area:     14.30 cm LA Vol (A2C): 32.7 ml 17.23 ml/m RA Volume:   35.30 ml  18.60 ml/m LA Vol (A4C): 23.1 ml 12.17 ml/m  AORTIC VALVE LVOT Vmax:   64.20 cm/s LVOT Vmean:  40.800 cm/s LVOT VTI:    0.101 m  AORTA Ao Root diam: 2.80 cm MITRAL VALVE MV Area (PHT): 1.66 cm    SHUNTS MV Decel Time: 456 msec    Systemic VTI: 0.10 m MV E velocity: 39.40 cm/s MV A velocity: 90.80 cm/s MV E/A ratio:  0.43 Rozann Lesches MD Electronically signed by Rozann Lesches MD Signature Date/Time: 10/12/2019/2:37:06 PM    Final         Scheduled Meds: . amLODipine  5 mg  Oral Daily  . aspirin EC  81 mg Oral Daily  . atorvastatin  40 mg Oral Daily  . clopidogrel  75 mg Oral Daily  . heparin  5,000 Units Subcutaneous Q8H  . insulin aspart  0-15 Units Subcutaneous TID WC  . insulin aspart  0-5 Units Subcutaneous QHS  . insulin aspart  3 Units Subcutaneous TID WC  . insulin glargine  14 Units Subcutaneous Daily  . isosorbide mononitrate  60 mg Oral Daily  . metoprolol succinate  100 mg Oral Daily  . multivitamin with minerals  1 tablet Oral Daily  . nicotine  14 mg Transdermal Daily  . pantoprazole  40 mg Oral Daily  . Ensure Max Protein  11 oz Oral BID  . sodium chloride flush  10-40 mL Intracatheter Q12H  . spironolactone  25 mg Oral Daily   Continuous Infusions:   Time spent: 20 minutes with over 50% of the time coordinating the patient's care    Harold Hedge, DO [facility] Pager [phone]  Call night coverage person covering after 7pm

## 2019-10-15 DIAGNOSIS — I25119 Atherosclerotic heart disease of native coronary artery with unspecified angina pectoris: Secondary | ICD-10-CM

## 2019-10-15 LAB — GLUCOSE, CAPILLARY
Glucose-Capillary: 190 mg/dL — ABNORMAL HIGH (ref 70–99)
Glucose-Capillary: 202 mg/dL — ABNORMAL HIGH (ref 70–99)
Glucose-Capillary: 188 mg/dL — ABNORMAL HIGH (ref 70–99)

## 2019-10-15 MED ORDER — BUSPIRONE HCL 5 MG PO TABS: 5.0000 mg | ORAL_TABLET | Freq: Two times a day (BID) | ORAL | 1 refills | Status: DC

## 2019-10-15 MED ORDER — ATORVASTATIN CALCIUM 40 MG PO TABS: 40.0000 mg | ORAL_TABLET | Freq: Every day | ORAL | 1 refills | Status: DC

## 2019-10-15 MED ORDER — BLOOD GLUCOSE MONITOR KIT: PACK | 0 refills | Status: DC

## 2019-10-15 MED ORDER — BAYER BREEZE 2 SYSTEM W/DEVICE KIT: 1.0000 | PACK | Freq: Two times a day (BID) | 0 refills | Status: DC

## 2019-10-15 MED ORDER — GLIMEPIRIDE 2 MG PO TABS: 2.0000 mg | ORAL_TABLET | ORAL | 1 refills | Status: DC

## 2019-10-15 NOTE — Plan of Care (Signed)
  Problem: Education: Goal: Knowledge of General Education information will improve Description: Including pain rating scale, medication(s)/side effects and non-pharmacologic comfort measures Outcome: Progressing   Problem: Nutrition: Goal: Adequate nutrition will be maintained Outcome: Progressing   Problem: Safety: Goal: Ability to remain free from injury will improve Outcome: Progressing   

## 2019-10-15 NOTE — Progress Notes (Signed)
Physical Therapy Treatment Patient Details Name: [name] MRN: [id number] DOB: 04-12-1941 Today's Date: 10/15/2019    History of Present Illness 79 y.o. female with HTN, DM,  CAD with inferior STEMI s/p PCI to dRCA 03/2014, PVD s/p abdominal aneurysm repair-08/2005, Left Femoral-popliteal bypass surgery-07/06/2014, frequent TIAs- since 2002 and strokes in past has chronic speech and left sided weakness who presented to the ED with worsening left-sided weakness since a.m. 10/11/19. MRI with 3-4 mm acute infarction in the posterior limb internal capsule on the right. Admitted for treatment of CVA.     PT Comments    Patient seen for PT treatment. Pt continues to rely on bed rails for bed mobility, min A for sit to stand transfers, and min-mod A for gait training using RW. Pt presents with L side weakness, L inattention, impaired balance, and decreased insight into deficits. Pt with L lateral LOB into wall while ambulating and required assistance to recover. Pt educated that current deficits increase risk for falls and pt remains adamant about d/c home vs SNF. Pt reports her daughters can provide 24 hour assistance upon d/c. PT will continue to follow acutely.    Follow Up Recommendations  SNF (24 hour supervision/assistance if d/c home)     Equipment Recommendations  RW   Recommendations for Other Services OT consult     Precautions / Restrictions Precautions Precautions: Fall Restrictions Weight Bearing Restrictions: No    Mobility  Bed Mobility Overal bed mobility: Needs Assistance Bed Mobility: Supine to Sit     Supine to sit: Min guard     General bed mobility comments: reliant on use of rail; min guard for safety  Transfers Overall transfer level: Needs assistance Equipment used: Rolling walker (2 wheeled) Transfers: Sit to/from Stand Sit to Stand: Min assist         General transfer comment: assist to power up into standing; cues for L hand placement on RW    Ambulation/Gait Ambulation/Gait assistance: Mod assist;Min assist Gait Distance (Feet): 80 Feet Assistive device: Rolling walker (2 wheeled) Gait Pattern/deviations: Step-through pattern;Decreased step length - right;Decreased step length - left;Decreased dorsiflexion - left;Decreased stance time - left;Narrow base of support Gait velocity: decreased   General Gait Details: cues for L hand placement on RW, L heel to toe, and safe use of AD; pt with near scissoring gait at times and with L lateral LOB into wall requiring assist to recover; assistance needed to safely manage RW as she tends to push toward L side given R UE stronger; tendency to shuffle L foot    Stairs             Wheelchair Mobility    Modified Rankin (Stroke Patients Only) Modified Rankin (Stroke Patients Only) Pre-Morbid Rankin Score: No significant disability Modified Rankin: Moderately severe disability     Balance Overall balance assessment: Needs assistance Sitting-balance support: Feet supported;No upper extremity supported Sitting balance-Leahy Scale: Fair     Standing balance support: During functional activity;Single extremity supported Standing balance-Leahy Scale: Poor                              Cognition Arousal/Alertness: Awake/alert Behavior During Therapy: WFL for tasks assessed/performed Overall Cognitive Status: No family/caregiver present to determine baseline cognitive functioning Area of Impairment: Safety/judgement;Memory                     Memory: Decreased short-term memory   Safety/Judgement: Decreased awareness  of safety;Decreased awareness of deficits     General Comments: poor insight into deficits even with L lateral LOB into wall when not provided assistance       Exercises      General Comments        Pertinent Vitals/Pain Pain Assessment: No/denies pain    Home Living                      Prior Function             PT Goals (current goals can now be found in the care plan section) Progress towards PT goals: Progressing toward goals    Frequency    Min 4X/week      PT Plan Current plan remains appropriate    Co-evaluation              AM-PAC PT "6 Clicks" Mobility   Outcome Measure  Help needed turning from your back to your side while in a flat bed without using bedrails?: None Help needed moving from lying on your back to sitting on the side of a flat bed without using bedrails?: A Little Help needed moving to and from a bed to a chair (including a wheelchair)?: A Little Help needed standing up from a chair using your arms (e.g., wheelchair or bedside chair)?: A Little Help needed to walk in hospital room?: A Lot Help needed climbing 3-5 steps with a railing? : A Lot 6 Click Score: 17    End of Session Equipment Utilized During Treatment: Gait belt Activity Tolerance: Patient tolerated treatment well Patient left: with call bell/phone within reach;in chair;with chair alarm set Nurse Communication: Mobility status PT Visit Diagnosis: Unsteadiness on feet (R26.81);Other abnormalities of gait and mobility (R26.89);Muscle weakness (generalized) (M62.81);Difficulty in walking, not elsewhere classified (R26.2);Other symptoms and signs involving the nervous system (R29.898);Hemiplegia and hemiparesis Hemiplegia - Right/Left: Left Hemiplegia - dominant/non-dominant: Non-dominant Hemiplegia - caused by: Cerebral infarction     Time: 1139-1212 PT Time Calculation (min) (ACUTE ONLY): 33 min  Charges:  $Gait Training: 23-37 mins                     [name], [name] [facility] Pager: [phone] Office: [phone]     [name] 10/15/2019, 3:37 PM

## 2019-10-15 NOTE — Discharge Summary (Signed)
Physician Discharge Summary  [name] SNK:[id number] DOB: 06/12/1940 DOA: 10/11/2019  PCP: [name], MD  Admit date: 10/11/2019 Discharge date: 10/15/2019  Admitted From:  home Disposition:  home Recommendations for Outpatient Follow-up:  1. Follow up with PCP in 1-2 weeks 2. Please obtain BMP/CBC in one week 3. Please follow up with neurology   Home Health yes Equipment/Devices:none  Discharge Condition stable CODE STATUS full Diet recommendation:cardiac Brief/Interim Summary:79 year old female with past medical history of hypertension, diabetes, CAD with inferior STEMI s/p PCI in 2015, tobacco abuse, PVD s/p abdominal aneurysm repair in 2007, left femoral-popliteal bypass surgery in 2016, frequent TIAs and strokes in the past with some residual left-sided weakness who presented to the ED with worsening left-sided weakness since morning of admission.  ED course: Vitals 174/74, HR 50s to 60s.  WBC 6, Hb 13.6, platelets 166, N 136, K4.7, glucose 324, Cr 1.16 Head CT unremarkable however MRI brain with 3 to 4 mm acute infarct in posterior limb of the internal capsule on the right.  Received Plavix load.  Seen by neurology.  Did not receive TPA as she was outside therapeutic window.  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 Started on dual antiplatelets    Discharge Diagnoses:  Active Problems:   Hypertension   DM (diabetes mellitus) type II uncontrolled, periph vascular disorder (HCC)   Coronary artery disease involving native coronary artery of native heart with angina pectoris ([place])   CVA (cerebral vascular accident) ([place])   1. Acute stroke, right-sideded 3 to 4 mm infarction of posterior limb of internal capsule-patient with residual left upper and left lower extremity weakness.  She has  multiple risk factors including prior stroke, type 2 diabetes, hypertension, hyperlipidemia, tobacco abuse.  Work-up showed 10/12/2019 Echo: Grade 1 diastolic dysfunction with EF 45% and global hypokinesis -previous echo in 2016 with EF 45 to 50%, mild LVH and mild hypokinesis Lipitor increased to 40 mg daily. Continue aspirin and Plavix indefinitely with outpatient neuro follow-up. Patient refusing to go to SNF.  We will discharge her home with home health PT OT aide. Discussed in detail with her oldest daughter Thayer Headings. SW to arrange or give information on Meals on Wheels and helping with transportation to doctor's appointments.  2. Hypertension On Norvasc, Imdur and Toprol-XL continue.  3. Hyperlipidemia/aortic atherosclerosis LDL 70 on Lipitor 20 mg daily. Increased to Lipitor 40 mg daily for goal <70  4. Tobacco abuse with nicotine withdrawal nicotine patch  5. Poorly controlled type 2 diabetes-hemoglobin A1c 12.0.  She has not been taking any medications for the past 3 months.  She was on glimepiride at home.  Will restart glimepiride.  Given her prescription for glucometer.   6. GERD continue home meds  7. CKD 3b, at baseline    Nutrition Problem: Inadequate oral intake Etiology: poor appetite    Signs/Symptoms: per patient/family report     Interventions: Premier Protein, MVI, Snacks  Estimated body mass index is 26.23 kg/m as calculated from the following:   Height as of this encounter: 5' 7.5" (1.715 m).   Weight as of this encounter: 77.1 kg.  Discharge Instructions  Discharge Instructions    Ambulatory referral to Neurology   Complete by: As directed    Follow up with stroke clinic NP (Jessica Vanschaick or Cecille Rubin, if both not available, consider Zachery Dauer, or Jaynee Eagles)  at [facility] in about 4 weeks. Thanks.   Call MD for:  difficulty breathing, headache or visual disturbances   Complete by: As directed    Call MD for:  extreme fatigue   Complete  by: As directed    Call MD for:  persistant dizziness or light-headedness   Complete by: As directed    Call MD for:  persistant nausea and vomiting   Complete by: As directed    Call MD for:  severe uncontrolled pain   Complete by: As directed    Diet - low sodium heart healthy   Complete by: As directed    Diet - low sodium heart healthy   Complete by: As directed    Discharge instructions   Complete by: As directed    You were seen and examined in the hospital for stroke and cared for by a hospitalist.   Upon Discharge:  - take aspirin and plavix daily - increase your Lipitor to 40 mg daily - Your diabetes is poorly controlled you must improve your diabetes with proper diet and lifestyle changes. This puts you at risk for another stroke.  - You need to stop smoking as this puts you at risk of having another stroke - Make an appointment with your primary care physician within 7 days - Follow up with neurology  Bring all home medications to your appointment to review Request that your primary physician go over all hospital tests and procedures/radiological results at the follow up.   Please get all hospital records sent to your physician by signing a hospital release before you go home.   Read the complete instructions along with all the possible side effects for all the medicines you take and that have been prescribed to you. Take any new medicines after you have completely understood and accept all the possible adverse reactions/side effects.   If you have any questions about your discharge medications or the care you received while you were in the hospital, you can call the unit and asked to speak with the hospitalist on call. Once you are discharged, your primary care physician will handle any further medical issues. Please note that NO REFILLS for any discharge medications will be authorized, as it is imperative that you return to your primary care physician (or establish a  relationship with a primary care physician if you do not have one) for your aftercare needs so that they can reassess your need for medications and monitor your lab values.   Do not drive, operate heavy machinery, perform activities at heights, swimming or participation in water activities or provide baby sitting services if your were admitted for loss of consciousness/seizures or if you are on sedating medications including, but not limited to benzodiazepines, sleep medications, narcotic pain medications, etc., until you have been cleared to do so by a medical doctor.   Do not take more than prescribed medications.   Wear a seat belt while driving.  If you have smoked or chewed Tobacco in the last 2 years please stop smoking; also stop any regular Alcohol and/or any Recreational drug use including marijuana.  If you experience worsening of your admission symptoms or develop shortness of breath, chest pain, suicidal or homicidal thoughts or experience a life threatening emergency, you must seek medical attention immediately by calling 911 or calling your PCP immediately.   Increase activity slowly   Complete by: As directed    Increase activity slowly   Complete by: As directed  Allergies as of 10/15/2019      Reactions   Keflex [cephalexin] Shortness Of Breath, Other (See Comments)   Chest pain, headache   Lipitor [atorvastatin] Shortness Of Breath, Other (See Comments)   Severe pain/cramping in legs- "I cannot walk, talk, or breathe" (pt is currently taking 20 mg daily 10/11/19)   Codeine Other (See Comments)   Disrupted patient's equilibrium- "Made my world flip upside down"   Hydrochlorothiazide Other (See Comments)   Other reaction(s): lightheaded, nausea   Hydrocodone-acetaminophen Other (See Comments)   Other reaction(s): "get crazy"   Metformin Hcl Other (See Comments)   Other reaction(s): upset stomach   Tramadol Hcl Other (See Comments)   Other reaction(s): "get crazy"    Azor [amlodipine-olmesartan] Swelling, Palpitations, Rash   Amlodipine alone resumed 12/2013, tolerating   Lisinopril Cough   Penicillins Dermatitis, Other (See Comments)   Did it involve swelling of the face/tongue/throat, SOB, or low BP? No, just worsened eczema Did it involve sudden or severe rash/hives, skin peeling, or any reaction on the inside of your mouth or nose? No Did you need to seek medical attention at a hospital or doctor's office? No When did it last happen? "More than 10 years ago" If all above answers are "NO", may proceed with cephalosporin use.      Medication List    TAKE these medications   acetaminophen 650 MG CR tablet Commonly known as: TYLENOL Take 650 mg by mouth at bedtime as needed for pain.   albuterol 108 (90 Base) MCG/ACT inhaler Commonly known as: VENTOLIN HFA Inhale 2 puffs into the lungs every 4 (four) hours as needed for wheezing or shortness of breath (or coughing).   amLODipine 5 MG tablet Commonly known as: NORVASC Take 5 mg by mouth daily.   aspirin EC 81 MG tablet Take 1 tablet (81 mg total) by mouth daily. What changed:   how much to take  when to take this  reasons to take this   atorvastatin 40 MG tablet Commonly known as: LIPITOR Take 1 tablet (40 mg total) by mouth daily. What changed:   medication strength  how much to take   [place] w/Device Kit 1 each by Does not apply route 2 (two) times daily.   clopidogrel 75 MG tablet Commonly known as: PLAVIX Take 75 mg by mouth daily.   esomeprazole 40 MG capsule Commonly known as: NEXIUM Take 40 mg by mouth daily as needed (acid reflux).   glimepiride 2 MG tablet Commonly known as: Amaryl Take 1 tablet (2 mg total) by mouth every morning.   isosorbide mononitrate 60 MG 24 hr tablet Commonly known as: IMDUR Take 60 mg by mouth daily.   Lubricant Eye Drops 0.4-0.3 % Soln Generic drug: Polyethyl Glycol-Propyl Glycol Place 1 drop into both eyes 3  (three) times daily as needed (dry/irritated eyes.). [employer] Advanced Eye Relief Dry Eye Lubricant Eye Drops   metoprolol succinate 100 MG 24 hr tablet Commonly known as: TOPROL-XL Take 100 mg by mouth daily.   nitroGLYCERIN 0.4 MG SL tablet Commonly known as: NITROSTAT Place 0.4 mg under the tongue every 5 (five) minutes as needed for chest pain.   spironolactone 25 MG tablet Commonly known as: ALDACTONE Take 25 mg by mouth daily.            Durable Medical Equipment  (From admission, onward)         Start     Ordered   10/15/19 1037  For home use only DME 3 n 1  Once     10/15/19 1036   10/15/19 1037  For home use only DME Walker rolling  Once    Question Answer Comment  Walker: With [place]   Patient needs a walker to treat with the following condition Stroke ([place])      10/15/19 1036   10/13/19 0725  For home use only DME 3 n 1  Once     10/13/19 0724          Contact information for follow-up providers    [facility]. Schedule an appointment as soon as possible for a visit in 4 week(s).   Specialty: Neurology Contact information: [address] [place] [phone]           Contact information for after-discharge care    [facility] .   Service: Skilled Nursing Contact information: [address] [address] [phone]                 Allergies  Allergen Reactions  . Keflex [Cephalexin] Shortness Of Breath and Other (See Comments)    Chest pain, headache  . Lipitor [Atorvastatin] Shortness Of Breath and Other (See Comments)    Severe pain/cramping in legs- "I cannot walk, talk, or breathe" (pt is currently taking 20 mg daily 10/11/19)  . Codeine Other (See Comments)    Disrupted patient's equilibrium- "Made my world flip upside down"  . Hydrochlorothiazide Other (See Comments)    Other reaction(s):  lightheaded, nausea  . Hydrocodone-Acetaminophen Other (See Comments)    Other reaction(s): "get crazy"  . Metformin Hcl Other (See Comments)    Other reaction(s): upset stomach  . Tramadol Hcl Other (See Comments)    Other reaction(s): "get crazy"  . Azor [Amlodipine-Olmesartan] Swelling, Palpitations and Rash    Amlodipine alone resumed 12/2013, tolerating  . Lisinopril Cough  . Penicillins Dermatitis and Other (See Comments)    Did it involve swelling of the face/tongue/throat, SOB, or low BP? No, just worsened eczema Did it involve sudden or severe rash/hives, skin peeling, or any reaction on the inside of your mouth or nose? No Did you need to seek medical attention at a hospital or doctor's office? No When did it last happen? "More than 10 years ago" If all above answers are "NO", may proceed with cephalosporin use.     Consultations: neuro  Procedures/Studies: CT ANGIO HEAD W OR WO CONTRAST  Result Date: 10/11/2019 CLINICAL DATA:  Stroke follow-up with residual left-sided deficits. EXAM: CT ANGIOGRAPHY HEAD AND NECK TECHNIQUE: Multidetector CT imaging of the head and neck was performed using the standard protocol during bolus administration of intravenous contrast. Multiplanar CT image reconstructions and MIPs were obtained to evaluate the vascular anatomy. Carotid stenosis measurements (when applicable) are obtained utilizing NASCET criteria, using the distal internal carotid diameter as the denominator. CONTRAST:  21m OMNIPAQUE IOHEXOL 350 MG/ML SOLN COMPARISON:  Head CT 10/11/2019 Brain MRI 10/11/2019 CTA chest abdomen pelvis 03/22/2018 CTA chest 04/28/2017 FINDINGS: CTA NECK FINDINGS SKELETON: There is no bony spinal canal stenosis. No lytic or blastic lesion. OTHER NECK: Normal pharynx, larynx and major salivary glands. No cervical lymphadenopathy. Unremarkable thyroid gland. UPPER CHEST: Biapical emphysema. AORTIC ARCH: There is extensive calcific atherosclerosis of the aortic  arch. There is no aneurysm, dissection or hemodynamically significant stenosis of the visualized portion of the aorta. Aberrant right subclavian artery.  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 Common carotid arteries share a origin. The visualized proximal subclavian arteries are widely patent. RIGHT CAROTID SYSTEM: Multifocal atherosclerosis within the common carotid artery. There is mixed density atherosclerosis at the carotid bifurcation but no hemodynamically significant stenosis. LEFT CAROTID SYSTEM: There is approximately 50% narrowing of the midportion of the common carotid artery. Mixed density plaque extends to the carotid bifurcation but does not cause hemodynamically significant stenosis. VERTEBRAL ARTERIES: Codominant configuration. Both origins are clearly patent. Moderate stenosis of the left V1 segment and left V3 segment. Otherwise, both vertebral arteries are patent to the vertebrobasilar confluence. CTA HEAD FINDINGS POSTERIOR CIRCULATION: --Vertebral arteries: Normal V4 segments. --Inferior cerebellar arteries: Normal. --Basilar artery: Normal. --Superior cerebellar arteries: Normal. --Posterior cerebral arteries (PCA): Normal. The left PCA is predominantly supplied by the posterior communicating artery. ANTERIOR CIRCULATION: --Intracranial internal carotid arteries: Moderate atherosclerotic calcification of the internal carotid arteries at the skull base with approximately 50% stenosis bilaterally. --Anterior cerebral arteries (ACA): Moderate stenosis of the right A2 segment. Both A1 segments are present. Otherwise normal. --Middle cerebral arteries (MCA): Mild atherosclerotic irregularity of the right M1 segment. Otherwise normal. VENOUS SINUSES: As permitted by contrast timing, patent. ANATOMIC VARIANTS: None Review of the MIP images confirms the above  findings. IMPRESSION: 1. No intracranial arterial occlusion or high-grade stenosis. 2. 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 3. Moderate stenosis of the right anterior cerebral artery A2 segment. 4. 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 5. Aortic Atherosclerosis (ICD10-I70.0) and Emphysema (ICD10-J43.9). Electronically Signed   By: Ulyses Jarred M.D.   On: 10/11/2019 23:47   CT HEAD WO CONTRAST  Result Date: 10/11/2019 CLINICAL DATA:  Pt arrives via [facility] from home for c/o L sided weakness, hx of cva with L side deficits (facial droop, grip strength weak and L leg drags). Pt and family state they think L side is weaker than at baseline. LSN 6pm yesterday by family, pt reports she went to bed around 830 last night, pt states she woke up this morning feeling weaker on L side than usua EXAM: CT HEAD WITHOUT CONTRAST TECHNIQUE: Contiguous axial images were obtained from the base of the skull through the vertex without intravenous contrast. COMPARISON:  04/07/2019 FINDINGS: Brain: No evidence of acute infarction, hemorrhage, hydrocephalus, extra-axial collection or mass lesion/mass effect. Old infarct extending from the right basal ganglia to the deep right white matter. Small old right thalamic lacunar infarct. Patchy white matter hypoattenuation most evident in the right frontal lobe consistent with chronic ischemic change. These findings are stable. Vascular: No hyperdense vessel or unexpected calcification. Skull: Normal. Negative for fracture or focal lesion. Sinuses/Orbits: Visualized globes and orbits are unremarkable. The visualized sinuses and mastoid air cells are clear. Other: None. IMPRESSION: 1. No acute intracranial abnormalities. No change from the prior study. Electronically Signed   By: Lajean Manes M.D.   On: 10/11/2019 11:19   CT ANGIO NECK W OR WO CONTRAST  Result Date: 10/11/2019 CLINICAL DATA:  Stroke  follow-up with residual left-sided deficits. EXAM: CT ANGIOGRAPHY HEAD AND NECK TECHNIQUE: Multidetector CT imaging of the head and neck was performed using the standard protocol during bolus administration of intravenous contrast. Multiplanar CT image reconstructions and MIPs were obtained to evaluate the vascular anatomy. Carotid stenosis measurements (when applicable) are obtained utilizing NASCET criteria, using the  distal internal carotid diameter as the denominator. CONTRAST:  27m OMNIPAQUE IOHEXOL 350 MG/ML SOLN COMPARISON:  Head CT 10/11/2019 Brain MRI 10/11/2019 CTA chest abdomen pelvis 03/22/2018 CTA chest 04/28/2017 FINDINGS: CTA NECK FINDINGS SKELETON: There is no bony spinal canal stenosis. No lytic or blastic lesion. OTHER NECK: Normal pharynx, larynx and major salivary glands. No cervical lymphadenopathy. Unremarkable thyroid gland. UPPER CHEST: Biapical emphysema. AORTIC ARCH: There is extensive calcific atherosclerosis of the aortic arch. There is no aneurysm, dissection or hemodynamically significant stenosis of the visualized portion of the aorta. Aberrant right subclavian artery. 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 Common carotid arteries share a origin. The visualized proximal subclavian arteries are widely patent. RIGHT CAROTID SYSTEM: Multifocal atherosclerosis within the common carotid artery. There is mixed density atherosclerosis at the carotid bifurcation but no hemodynamically significant stenosis. LEFT CAROTID SYSTEM: There is approximately 50% narrowing of the midportion of the common carotid artery. Mixed density plaque extends to the carotid bifurcation but does not cause hemodynamically significant stenosis. VERTEBRAL ARTERIES: Codominant configuration. Both origins are clearly patent. Moderate stenosis of the left V1 segment and left V3  segment. Otherwise, both vertebral arteries are patent to the vertebrobasilar confluence. CTA HEAD FINDINGS POSTERIOR CIRCULATION: --Vertebral arteries: Normal V4 segments. --Inferior cerebellar arteries: Normal. --Basilar artery: Normal. --Superior cerebellar arteries: Normal. --Posterior cerebral arteries (PCA): Normal. The left PCA is predominantly supplied by the posterior communicating artery. ANTERIOR CIRCULATION: --Intracranial internal carotid arteries: Moderate atherosclerotic calcification of the internal carotid arteries at the skull base with approximately 50% stenosis bilaterally. --Anterior cerebral arteries (ACA): Moderate stenosis of the right A2 segment. Both A1 segments are present. Otherwise normal. --Middle cerebral arteries (MCA): Mild atherosclerotic irregularity of the right M1 segment. Otherwise normal. VENOUS SINUSES: As permitted by contrast timing, patent. ANATOMIC VARIANTS: None Review of the MIP images confirms the above findings. IMPRESSION: 1. No intracranial arterial occlusion or high-grade stenosis. 2. 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 3. Moderate stenosis of the right anterior cerebral artery A2 segment. 4. 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 5. Aortic Atherosclerosis (ICD10-I70.0) and Emphysema (ICD10-J43.9). Electronically Signed   By: KUlyses JarredM.D.   On: 10/11/2019 23:47   MR BRAIN WO CONTRAST  Result Date: 10/11/2019 CLINICAL DATA:  Left-sided weakness affecting the arm and leg. Negative acute head CT. EXAM: MRI HEAD WITHOUT CONTRAST TECHNIQUE: Multiplanar, multiecho pulse sequences of the brain and surrounding structures were obtained without intravenous contrast. COMPARISON:  Head CT earlier same day.  MRI 02/18/2015. FINDINGS: Brain: Diffusion imaging shows a 3-4 mm acute infarction in the posterior limb internal capsule on the right. No other acute infarction. There are chronic  small-vessel ischemic changes of the pons. No focal cerebellar infarction. Cerebral hemispheres show extensive chronic small-vessel ischemic changes throughout the white matter, with a larger more confluent subcortical white matter infarction in the right frontoparietal region. Old small vessel infarctions of the basal ganglia and radiating white matter tracts, right more than left. Some hemosiderin deposition in that area. No mass, recent hemorrhage, hydrocephalus or extra-axial collection. Vascular: Major vessels at the base of the brain show flow. Skull and upper cervical spine: Negative Sinuses/Orbits: Clear/normal.  Previous lens implant on the right Other: None IMPRESSION: 3-4 mm acute infarction in the posterior limb internal capsule on the right. Extensive old ischemic changes elsewhere throughout the brain  as outlined above. Electronically Signed   By: Nelson Chimes M.D.   On: 10/11/2019 12:59   ECHOCARDIOGRAM COMPLETE  Result Date: 10/12/2019    ECHOCARDIOGRAM REPORT   Patient Name:   [name] Date of Exam: 10/12/2019 Medical Rec #:  [id number]      Height:       67.5 in Accession #:    [id number]     Weight:       170.0 lb Date of Birth:  16-Mar-1941       BSA:          1.898 m Patient Age:    38 years       BP:           177/73 mmHg Patient Gender: F              HR:           67 bpm. Exam Location:  Inpatient Procedure: 2D Echo, Cardiac Doppler and Color Doppler Indications:    Stroke 434.91 / I163.9, R/O PFO  History:        Patient has prior history of Echocardiogram examinations, most                 recent 12/19/2014. CAD and Previous Myocardial Infarction,                 Stroke; Risk Factors:Hypertension, Diabetes and Dyslipidemia.  Sonographer:    Alvino Chapel RCS Referring Phys: [id number] [place]  1. Left ventricular ejection fraction, by estimation, is 45%. The left ventricle has mildly decreased function. The left ventricle demonstrates global hypokinesis. There is  mild left ventricular hypertrophy. Left ventricular diastolic parameters are consistent with Grade I diastolic dysfunction (impaired relaxation).  2. The mitral valve is degenerative, mildly calcified with moderate annular calcification. Trivial mitral valve regurgitation.  3. The aortic valve is tricuspid. Aortic valve regurgitation is not visualized.  4. The inferior vena cava is normal in size with greater than 50% respiratory variability, suggesting right atrial pressure of 3 mmHg.  5. Right ventricular systolic function is normal. The right ventricular size is normal. Tricuspid regurgitation signal is inadequate for assessing PA pressure. FINDINGS  Left Ventricle: Left ventricular ejection fraction, by estimation, is 45%. The left ventricle has mildly decreased function. The left ventricle demonstrates global hypokinesis. The left ventricular internal cavity size was normal in size. There is mild left ventricular hypertrophy. Left ventricular diastolic parameters are consistent with Grade I diastolic dysfunction (impaired relaxation). Right Ventricle: The right ventricular size is normal. No increase in right ventricular wall thickness. Right ventricular systolic function is normal. Tricuspid regurgitation signal is inadequate for assessing PA pressure. Left Atrium: Left atrial size was normal in size. Right Atrium: Right atrial size was normal in size. Pericardium: There is no evidence of pericardial effusion. Mitral Valve: The mitral valve is degenerative in appearance. There is mild calcification of the mitral valve leaflet(s). Mild to moderate mitral annular calcification. Trivial mitral valve regurgitation. Tricuspid Valve: The tricuspid valve is grossly normal. Tricuspid valve regurgitation is trivial. Aortic Valve: The aortic valve is tricuspid. Aortic valve regurgitation is not visualized. Mild aortic valve annular calcification. Pulmonic Valve: The pulmonic valve was grossly normal. Pulmonic valve  regurgitation is trivial. Aorta: The aortic root is normal in size and structure. Venous: The inferior vena cava is normal in size with greater than 50% respiratory variability, suggesting right atrial pressure of 3 mmHg. IAS/Shunts: No atrial level shunt detected by color flow Doppler.  LEFT VENTRICLE PLAX 2D LVIDd:         3.40 cm     Diastology LVIDs:         2.60 cm     LV e' lateral:   6.42 cm/s LV PW:         1.20 cm     LV E/e' lateral: 6.1 LV IVS:        1.30 cm     LV e' medial:    4.03 cm/s                            LV E/e' medial:  9.8  LV Volumes (MOD) LV vol d, MOD A2C: 35.5 ml LV vol d, MOD A4C: 56.0 ml LV vol s, MOD A2C: 22.6 ml LV vol s, MOD A4C: 24.7 ml LV SV MOD A2C:     12.9 ml LV SV MOD A4C:     56.0 ml LV SV MOD BP:      22.7 ml RIGHT VENTRICLE RV S prime:     7.40 cm/s TAPSE (M-mode): 1.9 cm LEFT ATRIUM           Index       RIGHT ATRIUM           Index LA diam:      2.90 cm 1.53 cm/m  RA Area:     14.30 cm LA Vol (A2C): 32.7 ml 17.23 ml/m RA Volume:   35.30 ml  18.60 ml/m LA Vol (A4C): 23.1 ml 12.17 ml/m  AORTIC VALVE LVOT Vmax:   64.20 cm/s LVOT Vmean:  40.800 cm/s LVOT VTI:    0.101 m  AORTA Ao Root diam: 2.80 cm MITRAL VALVE MV Area (PHT): 1.66 cm    SHUNTS MV Decel Time: 456 msec    Systemic VTI: 0.10 m MV E velocity: 39.40 cm/s MV A velocity: 90.80 cm/s MV E/A ratio:  0.43 Rozann Lesches MD Electronically signed by Rozann Lesches MD Signature Date/Time: 10/12/2019/2:37:06 PM    Final     (Echo, Carotid, EGD, Colonoscopy, ERCP)    Subjective:  Patient resting in bed no new complaints still with some left-sided weakness Discharge Exam: Vitals:   10/15/19 0452 10/15/19 0757  BP: (!) 150/68 (!) 147/70  Pulse: (!) 59 64  Resp: 17 20  Temp: 97.8 F (36.6 C) 98.7 F (37.1 C)  SpO2: 92% 99%   Vitals:   10/14/19 1948 10/14/19 2332 10/15/19 0452 10/15/19 0757  BP: (!) 123/59 (!) 156/67 (!) 150/68 (!) 147/70  Pulse: 63 62 (!) 59 64  Resp: '20 17 17 20  ' Temp: 98.1 F  (36.7 C) 98.1 F (36.7 C) 97.8 F (36.6 C) 98.7 F (37.1 C)  TempSrc: Oral Oral Oral Oral  SpO2: 97% 97% 92% 99%  Weight:      Height:        General: Pt is alert, awake, not in acute distress Cardiovascular: RRR, S1/S2 +, no rubs, no gallops Respiratory: CTA bilaterally, no wheezing, no rhonchi Abdominal: Soft, NT, ND, bowel sounds + Extremities: no edema, no cyanosis Neuro left upper and lower extremity weakness    The results of significant diagnostics from this hospitalization (including imaging, microbiology, ancillary and laboratory) are listed below for reference.     Microbiology: Recent Results (from the past 240 hour(s))  SARS CORONAVIRUS 2 (TAT 6-24 HRS) Nasopharyngeal Nasopharyngeal Swab     Status: None   Collection Time: 10/11/19  3:15 PM  Specimen: Nasopharyngeal Swab  Result Value Ref Range Status   SARS Coronavirus 2 NEGATIVE NEGATIVE Final    Comment: (NOTE) SARS-CoV-2 target nucleic acids are NOT DETECTED. The SARS-CoV-2 RNA is generally detectable in upper and lower respiratory specimens during the acute phase of infection. Negative results do not preclude SARS-CoV-2 infection, do not rule out co-infections with other pathogens, and should not be used as the sole basis for treatment or other patient management decisions. Negative results must be combined with clinical observations, patient history, and epidemiological information. The expected result is Negative. Fact Sheet for Patients: SugarRoll.be Fact Sheet for Healthcare Providers: https://www.woods-.com/ This test is not yet approved or cleared by the [place] FDA and  has been authorized for detection and/or diagnosis of SARS-CoV-2 by FDA under an Emergency Use Authorization (EUA). This EUA will remain  in effect (meaning this test can be used) for the duration of the COVID-19 declaration under Section 56 4(b)(1) of the Act, 21  U.S.C. section 360bbb-3(b)(1), unless the authorization is terminated or revoked sooner. Performed at [facility], [place] [address], [place], [place] [address]      Labs: BNP (last 3 results) Recent Labs    04/07/19 1513  BNP 408.1*   Basic Metabolic Panel: Recent Labs  Lab 10/11/19 1048 10/11/19 1048 10/11/19 2224 10/12/19 0010 10/12/19 0500 10/13/19 0308 10/14/19 0333  NA 136  --   --  133* 135 137 138  K 4.7  --   --  3.7 4.2 4.0 4.0  CL 101  --   --  98 100 106 104  CO2 25  --   --  '22 24 22 23  ' GLUCOSE 374*  --   --  428* 359* 212* 134*  BUN 14  --   --  '15 16 21 ' 30*  CREATININE 1.16*   < > 1.26* 1.21* 1.17* 1.20* 1.58*  CALCIUM 9.6  --   --  9.4 9.6 9.1 9.1   < > = values in this interval not displayed.   Liver Function Tests: Recent Labs  Lab 10/11/19 1048 10/12/19 0500 10/13/19 0308 10/14/19 0333  AST 12* 11* 14* 17  ALT '10 11 10 10  ' ALKPHOS 73 66 50 45  BILITOT 1.0 0.7 1.1 0.9  PROT 8.0 7.5 6.7 6.4*  ALBUMIN 3.5 3.3* 2.9* 2.9*   No results for input(s): LIPASE, AMYLASE in the last 168 hours. No results for input(s): AMMONIA in the last 168 hours. CBC: Recent Labs  Lab 10/11/19 1048 10/12/19 0500 10/13/19 0308 10/14/19 0333  WBC 6.4 7.3 6.5 6.2  NEUTROABS 4.0  --   --   --   HGB 13.6 13.0 12.9 12.1  HCT 42.3 39.9 39.0 36.8  MCV 89.8 88.9 88.6 88.7  PLT 166 157 148* 159   Cardiac Enzymes: No results for input(s): CKTOTAL, CKMB, CKMBINDEX, TROPONINI in the last 168 hours. BNP: Invalid input(s): POCBNP CBG: Recent Labs  Lab 10/14/19 0645 10/14/19 1203 10/14/19 1619 10/14/19 2147 10/15/19 0622  GLUCAP 155* 211* 162* 246* 190*   D-Dimer No results for input(s): DDIMER in the last 72 hours. Hgb A1c No results for input(s): HGBA1C in the last 72 hours. Lipid Profile No results for input(s): CHOL, HDL, LDLCALC, TRIG, CHOLHDL, LDLDIRECT in the last 72 hours. Thyroid function studies No results for input(s): TSH, T4TOTAL,  T3FREE, THYROIDAB in the last 72 hours.  Invalid input(s): FREET3 Anemia work up No results for input(s): VITAMINB12, FOLATE, FERRITIN, TIBC, IRON, RETICCTPCT in  the last 72 hours. Urinalysis    Component Value Date/Time   COLORURINE RED (A) 09/03/2015 2312   APPEARANCEUR TURBID (A) 09/03/2015 2312   LABSPEC 1.011 09/03/2015 2312   PHURINE 5.5 09/03/2015 2312   GLUCOSEU NEGATIVE 09/03/2015 2312   HGBUR LARGE (A) 09/03/2015 2312   BILIRUBINUR NEGATIVE 09/03/2015 2312   KETONESUR 15 (A) 09/03/2015 2312   PROTEINUR 100 (A) 09/03/2015 2312   UROBILINOGEN 0.2 07/04/2014 0319   NITRITE NEGATIVE 09/03/2015 2312   LEUKOCYTESUR LARGE (A) 09/03/2015 2312   Sepsis Labs Invalid input(s): PROCALCITONIN,  WBC,  LACTICIDVEN Microbiology Recent Results (from the past 240 hour(s))  SARS CORONAVIRUS 2 (TAT 6-24 HRS) Nasopharyngeal Nasopharyngeal Swab     Status: None   Collection Time: 10/11/19  3:15 PM   Specimen: Nasopharyngeal Swab  Result Value Ref Range Status   SARS Coronavirus 2 NEGATIVE NEGATIVE Final    Comment: (NOTE) SARS-CoV-2 target nucleic acids are NOT DETECTED. The SARS-CoV-2 RNA is generally detectable in upper and lower respiratory specimens during the acute phase of infection. Negative results do not preclude SARS-CoV-2 infection, do not rule out co-infections with other pathogens, and should not be used as the sole basis for treatment or other patient management decisions. Negative results must be combined with clinical observations, patient history, and epidemiological information. The expected result is Negative. Fact Sheet for Patients: SugarRoll.be Fact Sheet for Healthcare Providers: https://www.woods-[place]el.com/ This test is not yet approved or cleared by the [place] FDA and  has been authorized for detection and/or diagnosis of SARS-CoV-2 by FDA under an Emergency Use Authorization (EUA). This EUA will remain  in  effect (meaning this test can be used) for the duration of the COVID-19 declaration under Section 56 4(b)(1) of the Act, 21 U.S.C. section 360bbb-3(b)(1), unless the authorization is terminated or revoked sooner. Performed at [facility], [place] [address] [place], [place] [address]      Time coordinating discharge:  39 minutes  SIGNED:   Georgette Shell, MD  [facility] 10/15/2019, 11:09 AM Pager   If 7PM-7AM, please contact night-coverage [email or web address] Password [email or web address]

## 2019-10-15 NOTE — Care Management Important Message (Signed)
Important Message  Patient Details  Name: [name] MRN: [id number] Date of Birth: March 13, 1941   Medicare Important Message Given:  Yes     Sophonie Goforth Montine Circle 10/15/2019, 3:02 PM

## 2019-10-15 NOTE — TOC Transition Note (Signed)
Transition of Care [facility]) - CM/SW Discharge Note   Patient Details  Name: Camay Pedigo MRN: [id number] Date of Birth: 11-05-1940  [facility] CM/SW Contact:  Pollie Friar, RN Phone Number: 10/15/2019, 11:36 AM   Clinical Narrative:    Pt has changed her mind and has decided to d/c home with [facility] services. CM will update Cory with [place].  DME ordered delivered to the room per [employer].  CM provided her the SCAT information and application.  Pt is calling daughter to get transport home.   Final next level of care: [place] Barriers to Discharge: No Barriers Identified   Patient Goals and CMS Choice Patient states their goals for this hospitalization and ongoing recovery are:: Go home tomorrow ( patient already tried to sign out AMA) CMS Medicare.gov Compare Post Acute Care list provided to:: Patient Choice offered to / list presented to : Patient  Discharge Placement                       Discharge Plan and Services   Discharge Planning Services: CM Consult Post Acute Care Choice: Home Health          DME Arranged: 3-N-1, [place] rolling DME Agency: [employer] Date DME Agency Contacted: 10/15/19   Representative spoke with at DME Agency: [place]: PT, OT, Nurse's Aide, Social Work [employer] Agency: [place] Date [place]: 10/15/19 Time [place]: 1617(plan for going to home tuesday wont know for sure until they can call in [place] to find out) Representative spoke with at [place]: [place] ([place]) Interventions     Readmission Risk Interventions No flowsheet data found.

## 2019-10-17 DIAGNOSIS — E1121 Type 2 diabetes mellitus with diabetic nephropathy: Secondary | ICD-10-CM | POA: Diagnosis not present

## 2019-10-17 DIAGNOSIS — I639 Cerebral infarction, unspecified: Secondary | ICD-10-CM | POA: Diagnosis not present

## 2019-10-21 DIAGNOSIS — I25119 Atherosclerotic heart disease of native coronary artery with unspecified angina pectoris: Secondary | ICD-10-CM | POA: Diagnosis not present

## 2019-10-21 DIAGNOSIS — I69354 Hemiplegia and hemiparesis following cerebral infarction affecting left non-dominant side: Secondary | ICD-10-CM | POA: Diagnosis not present

## 2019-10-21 DIAGNOSIS — I252 Old myocardial infarction: Secondary | ICD-10-CM | POA: Diagnosis not present

## 2019-10-21 DIAGNOSIS — E1122 Type 2 diabetes mellitus with diabetic chronic kidney disease: Secondary | ICD-10-CM | POA: Diagnosis not present

## 2019-10-21 DIAGNOSIS — I1310 Hypertensive heart and chronic kidney disease without heart failure, with stage 1 through stage 4 chronic kidney disease, or unspecified chronic kidney disease: Secondary | ICD-10-CM | POA: Diagnosis not present

## 2019-10-21 DIAGNOSIS — I10 Essential (primary) hypertension: Secondary | ICD-10-CM | POA: Diagnosis not present

## 2019-10-21 DIAGNOSIS — E1151 Type 2 diabetes mellitus with diabetic peripheral angiopathy without gangrene: Secondary | ICD-10-CM | POA: Diagnosis not present

## 2019-10-21 DIAGNOSIS — I088 Other rheumatic multiple valve diseases: Secondary | ICD-10-CM | POA: Diagnosis not present

## 2019-10-21 DIAGNOSIS — J439 Emphysema, unspecified: Secondary | ICD-10-CM | POA: Diagnosis not present

## 2019-10-21 DIAGNOSIS — N1832 Chronic kidney disease, stage 3b: Secondary | ICD-10-CM | POA: Diagnosis not present

## 2019-10-22 DIAGNOSIS — I69354 Hemiplegia and hemiparesis following cerebral infarction affecting left non-dominant side: Secondary | ICD-10-CM | POA: Diagnosis not present

## 2019-10-22 DIAGNOSIS — I088 Other rheumatic multiple valve diseases: Secondary | ICD-10-CM | POA: Diagnosis not present

## 2019-10-22 DIAGNOSIS — I252 Old myocardial infarction: Secondary | ICD-10-CM | POA: Diagnosis not present

## 2019-10-22 DIAGNOSIS — E1151 Type 2 diabetes mellitus with diabetic peripheral angiopathy without gangrene: Secondary | ICD-10-CM | POA: Diagnosis not present

## 2019-10-22 DIAGNOSIS — N1832 Chronic kidney disease, stage 3b: Secondary | ICD-10-CM | POA: Diagnosis not present

## 2019-10-22 DIAGNOSIS — I25119 Atherosclerotic heart disease of native coronary artery with unspecified angina pectoris: Secondary | ICD-10-CM | POA: Diagnosis not present

## 2019-10-22 DIAGNOSIS — I1310 Hypertensive heart and chronic kidney disease without heart failure, with stage 1 through stage 4 chronic kidney disease, or unspecified chronic kidney disease: Secondary | ICD-10-CM | POA: Diagnosis not present

## 2019-10-22 DIAGNOSIS — J439 Emphysema, unspecified: Secondary | ICD-10-CM | POA: Diagnosis not present

## 2019-10-22 DIAGNOSIS — E1122 Type 2 diabetes mellitus with diabetic chronic kidney disease: Secondary | ICD-10-CM | POA: Diagnosis not present

## 2019-10-24 ENCOUNTER — Other Ambulatory Visit: Payer: Self-pay

## 2019-10-24 DIAGNOSIS — I1310 Hypertensive heart and chronic kidney disease without heart failure, with stage 1 through stage 4 chronic kidney disease, or unspecified chronic kidney disease: Secondary | ICD-10-CM | POA: Diagnosis not present

## 2019-10-24 DIAGNOSIS — J439 Emphysema, unspecified: Secondary | ICD-10-CM | POA: Diagnosis not present

## 2019-10-24 DIAGNOSIS — E1151 Type 2 diabetes mellitus with diabetic peripheral angiopathy without gangrene: Secondary | ICD-10-CM | POA: Diagnosis not present

## 2019-10-24 DIAGNOSIS — I088 Other rheumatic multiple valve diseases: Secondary | ICD-10-CM | POA: Diagnosis not present

## 2019-10-24 DIAGNOSIS — I25119 Atherosclerotic heart disease of native coronary artery with unspecified angina pectoris: Secondary | ICD-10-CM | POA: Diagnosis not present

## 2019-10-24 DIAGNOSIS — E1122 Type 2 diabetes mellitus with diabetic chronic kidney disease: Secondary | ICD-10-CM | POA: Diagnosis not present

## 2019-10-24 DIAGNOSIS — I69354 Hemiplegia and hemiparesis following cerebral infarction affecting left non-dominant side: Secondary | ICD-10-CM | POA: Diagnosis not present

## 2019-10-24 DIAGNOSIS — I252 Old myocardial infarction: Secondary | ICD-10-CM | POA: Diagnosis not present

## 2019-10-24 DIAGNOSIS — N1832 Chronic kidney disease, stage 3b: Secondary | ICD-10-CM | POA: Diagnosis not present

## 2019-10-24 NOTE — Patient Outreach (Signed)
[place] [facility]) Care Management  10/24/2019  Gissell Barra January 06, 1941 [id number]    EMMI-Stroke RED ON EMMI ALERT Day # 6 Date: 10/23/2019 Red Alert Reason: "Smoked or been around smoke? Yes"   Outreach attempt #1 to patient. Spoke with patient who reports she is doing and feeling well She denies any acute issues or concerns. She voices that HHPT just left her home. She was told by therapist that she is doing good and making progress. Reviewed and addressed red alert with patient. She voices that she continues to smoke but has thought about quitting. She is interested in trying nicotine patches. She has already completed PCP follow up appt but forgot to mention it to MD. RN CM instructed patient to contact office and to discuss with MD which smoking cessation medication is recommended for her. She will call office and follow up. She denies any further RN CM needs or concerns at this time. Patient advised  that they would continue to get automated EMMI-Stroke post discharge calls to assess how they are doing following recent hospitalization and will receive a call from a nurse if any of their responses were abnormal. Patient voiced understanding and was appreciative of f/u call.     Plan: RN CM will close case at this time.  Enzo Montgomery, RN,BSN,CCM [place] Management Telephonic Care Management Coordinator Direct Phone: [phone] Toll Free: [phone] Fax: [phone]

## 2019-10-28 DIAGNOSIS — E1122 Type 2 diabetes mellitus with diabetic chronic kidney disease: Secondary | ICD-10-CM | POA: Diagnosis not present

## 2019-10-28 DIAGNOSIS — I1310 Hypertensive heart and chronic kidney disease without heart failure, with stage 1 through stage 4 chronic kidney disease, or unspecified chronic kidney disease: Secondary | ICD-10-CM | POA: Diagnosis not present

## 2019-10-28 DIAGNOSIS — J439 Emphysema, unspecified: Secondary | ICD-10-CM | POA: Diagnosis not present

## 2019-10-28 DIAGNOSIS — E1151 Type 2 diabetes mellitus with diabetic peripheral angiopathy without gangrene: Secondary | ICD-10-CM | POA: Diagnosis not present

## 2019-10-28 DIAGNOSIS — I69354 Hemiplegia and hemiparesis following cerebral infarction affecting left non-dominant side: Secondary | ICD-10-CM | POA: Diagnosis not present

## 2019-10-28 DIAGNOSIS — I088 Other rheumatic multiple valve diseases: Secondary | ICD-10-CM | POA: Diagnosis not present

## 2019-10-28 DIAGNOSIS — I252 Old myocardial infarction: Secondary | ICD-10-CM | POA: Diagnosis not present

## 2019-10-28 DIAGNOSIS — I25119 Atherosclerotic heart disease of native coronary artery with unspecified angina pectoris: Secondary | ICD-10-CM | POA: Diagnosis not present

## 2019-10-28 DIAGNOSIS — N1832 Chronic kidney disease, stage 3b: Secondary | ICD-10-CM | POA: Diagnosis not present

## 2019-10-29 DIAGNOSIS — I088 Other rheumatic multiple valve diseases: Secondary | ICD-10-CM | POA: Diagnosis not present

## 2019-10-29 DIAGNOSIS — I69354 Hemiplegia and hemiparesis following cerebral infarction affecting left non-dominant side: Secondary | ICD-10-CM | POA: Diagnosis not present

## 2019-10-29 DIAGNOSIS — I1310 Hypertensive heart and chronic kidney disease without heart failure, with stage 1 through stage 4 chronic kidney disease, or unspecified chronic kidney disease: Secondary | ICD-10-CM | POA: Diagnosis not present

## 2019-10-29 DIAGNOSIS — E1122 Type 2 diabetes mellitus with diabetic chronic kidney disease: Secondary | ICD-10-CM | POA: Diagnosis not present

## 2019-10-29 DIAGNOSIS — J439 Emphysema, unspecified: Secondary | ICD-10-CM | POA: Diagnosis not present

## 2019-10-29 DIAGNOSIS — N1832 Chronic kidney disease, stage 3b: Secondary | ICD-10-CM | POA: Diagnosis not present

## 2019-10-29 DIAGNOSIS — I25119 Atherosclerotic heart disease of native coronary artery with unspecified angina pectoris: Secondary | ICD-10-CM | POA: Diagnosis not present

## 2019-10-29 DIAGNOSIS — I252 Old myocardial infarction: Secondary | ICD-10-CM | POA: Diagnosis not present

## 2019-10-29 DIAGNOSIS — E1151 Type 2 diabetes mellitus with diabetic peripheral angiopathy without gangrene: Secondary | ICD-10-CM | POA: Diagnosis not present

## 2019-10-30 DIAGNOSIS — I69354 Hemiplegia and hemiparesis following cerebral infarction affecting left non-dominant side: Secondary | ICD-10-CM | POA: Diagnosis not present

## 2019-10-30 DIAGNOSIS — I252 Old myocardial infarction: Secondary | ICD-10-CM | POA: Diagnosis not present

## 2019-10-30 DIAGNOSIS — I088 Other rheumatic multiple valve diseases: Secondary | ICD-10-CM | POA: Diagnosis not present

## 2019-10-30 DIAGNOSIS — E1151 Type 2 diabetes mellitus with diabetic peripheral angiopathy without gangrene: Secondary | ICD-10-CM | POA: Diagnosis not present

## 2019-10-30 DIAGNOSIS — J439 Emphysema, unspecified: Secondary | ICD-10-CM | POA: Diagnosis not present

## 2019-10-30 DIAGNOSIS — I25119 Atherosclerotic heart disease of native coronary artery with unspecified angina pectoris: Secondary | ICD-10-CM | POA: Diagnosis not present

## 2019-10-30 DIAGNOSIS — I1310 Hypertensive heart and chronic kidney disease without heart failure, with stage 1 through stage 4 chronic kidney disease, or unspecified chronic kidney disease: Secondary | ICD-10-CM | POA: Diagnosis not present

## 2019-10-30 DIAGNOSIS — N1832 Chronic kidney disease, stage 3b: Secondary | ICD-10-CM | POA: Diagnosis not present

## 2019-10-30 DIAGNOSIS — E1122 Type 2 diabetes mellitus with diabetic chronic kidney disease: Secondary | ICD-10-CM | POA: Diagnosis not present

## 2019-10-31 DIAGNOSIS — I69354 Hemiplegia and hemiparesis following cerebral infarction affecting left non-dominant side: Secondary | ICD-10-CM | POA: Diagnosis not present

## 2019-10-31 DIAGNOSIS — I252 Old myocardial infarction: Secondary | ICD-10-CM | POA: Diagnosis not present

## 2019-10-31 DIAGNOSIS — I088 Other rheumatic multiple valve diseases: Secondary | ICD-10-CM | POA: Diagnosis not present

## 2019-10-31 DIAGNOSIS — J439 Emphysema, unspecified: Secondary | ICD-10-CM | POA: Diagnosis not present

## 2019-10-31 DIAGNOSIS — I1310 Hypertensive heart and chronic kidney disease without heart failure, with stage 1 through stage 4 chronic kidney disease, or unspecified chronic kidney disease: Secondary | ICD-10-CM | POA: Diagnosis not present

## 2019-10-31 DIAGNOSIS — E1122 Type 2 diabetes mellitus with diabetic chronic kidney disease: Secondary | ICD-10-CM | POA: Diagnosis not present

## 2019-10-31 DIAGNOSIS — E1151 Type 2 diabetes mellitus with diabetic peripheral angiopathy without gangrene: Secondary | ICD-10-CM | POA: Diagnosis not present

## 2019-10-31 DIAGNOSIS — N1832 Chronic kidney disease, stage 3b: Secondary | ICD-10-CM | POA: Diagnosis not present

## 2019-10-31 DIAGNOSIS — I25119 Atherosclerotic heart disease of native coronary artery with unspecified angina pectoris: Secondary | ICD-10-CM | POA: Diagnosis not present

## 2019-11-04 DIAGNOSIS — I1310 Hypertensive heart and chronic kidney disease without heart failure, with stage 1 through stage 4 chronic kidney disease, or unspecified chronic kidney disease: Secondary | ICD-10-CM | POA: Diagnosis not present

## 2019-11-04 DIAGNOSIS — I25119 Atherosclerotic heart disease of native coronary artery with unspecified angina pectoris: Secondary | ICD-10-CM | POA: Diagnosis not present

## 2019-11-04 DIAGNOSIS — I252 Old myocardial infarction: Secondary | ICD-10-CM | POA: Diagnosis not present

## 2019-11-04 DIAGNOSIS — N1832 Chronic kidney disease, stage 3b: Secondary | ICD-10-CM | POA: Diagnosis not present

## 2019-11-04 DIAGNOSIS — I088 Other rheumatic multiple valve diseases: Secondary | ICD-10-CM | POA: Diagnosis not present

## 2019-11-04 DIAGNOSIS — J439 Emphysema, unspecified: Secondary | ICD-10-CM | POA: Diagnosis not present

## 2019-11-04 DIAGNOSIS — E1122 Type 2 diabetes mellitus with diabetic chronic kidney disease: Secondary | ICD-10-CM | POA: Diagnosis not present

## 2019-11-04 DIAGNOSIS — I69354 Hemiplegia and hemiparesis following cerebral infarction affecting left non-dominant side: Secondary | ICD-10-CM | POA: Diagnosis not present

## 2019-11-04 DIAGNOSIS — E1151 Type 2 diabetes mellitus with diabetic peripheral angiopathy without gangrene: Secondary | ICD-10-CM | POA: Diagnosis not present

## 2019-11-06 DIAGNOSIS — I088 Other rheumatic multiple valve diseases: Secondary | ICD-10-CM | POA: Diagnosis not present

## 2019-11-06 DIAGNOSIS — E1122 Type 2 diabetes mellitus with diabetic chronic kidney disease: Secondary | ICD-10-CM | POA: Diagnosis not present

## 2019-11-06 DIAGNOSIS — I1310 Hypertensive heart and chronic kidney disease without heart failure, with stage 1 through stage 4 chronic kidney disease, or unspecified chronic kidney disease: Secondary | ICD-10-CM | POA: Diagnosis not present

## 2019-11-06 DIAGNOSIS — I69354 Hemiplegia and hemiparesis following cerebral infarction affecting left non-dominant side: Secondary | ICD-10-CM | POA: Diagnosis not present

## 2019-11-06 DIAGNOSIS — E1151 Type 2 diabetes mellitus with diabetic peripheral angiopathy without gangrene: Secondary | ICD-10-CM | POA: Diagnosis not present

## 2019-11-06 DIAGNOSIS — J439 Emphysema, unspecified: Secondary | ICD-10-CM | POA: Diagnosis not present

## 2019-11-06 DIAGNOSIS — N1832 Chronic kidney disease, stage 3b: Secondary | ICD-10-CM | POA: Diagnosis not present

## 2019-11-06 DIAGNOSIS — I25119 Atherosclerotic heart disease of native coronary artery with unspecified angina pectoris: Secondary | ICD-10-CM | POA: Diagnosis not present

## 2019-11-06 DIAGNOSIS — I252 Old myocardial infarction: Secondary | ICD-10-CM | POA: Diagnosis not present

## 2019-11-11 DIAGNOSIS — J439 Emphysema, unspecified: Secondary | ICD-10-CM | POA: Diagnosis not present

## 2019-11-11 DIAGNOSIS — N1832 Chronic kidney disease, stage 3b: Secondary | ICD-10-CM | POA: Diagnosis not present

## 2019-11-11 DIAGNOSIS — E1122 Type 2 diabetes mellitus with diabetic chronic kidney disease: Secondary | ICD-10-CM | POA: Diagnosis not present

## 2019-11-11 DIAGNOSIS — I088 Other rheumatic multiple valve diseases: Secondary | ICD-10-CM | POA: Diagnosis not present

## 2019-11-11 DIAGNOSIS — I1310 Hypertensive heart and chronic kidney disease without heart failure, with stage 1 through stage 4 chronic kidney disease, or unspecified chronic kidney disease: Secondary | ICD-10-CM | POA: Diagnosis not present

## 2019-11-11 DIAGNOSIS — I252 Old myocardial infarction: Secondary | ICD-10-CM | POA: Diagnosis not present

## 2019-11-11 DIAGNOSIS — I25119 Atherosclerotic heart disease of native coronary artery with unspecified angina pectoris: Secondary | ICD-10-CM | POA: Diagnosis not present

## 2019-11-11 DIAGNOSIS — I69354 Hemiplegia and hemiparesis following cerebral infarction affecting left non-dominant side: Secondary | ICD-10-CM | POA: Diagnosis not present

## 2019-11-11 DIAGNOSIS — E1151 Type 2 diabetes mellitus with diabetic peripheral angiopathy without gangrene: Secondary | ICD-10-CM | POA: Diagnosis not present

## 2019-11-13 DIAGNOSIS — J439 Emphysema, unspecified: Secondary | ICD-10-CM | POA: Diagnosis not present

## 2019-11-13 DIAGNOSIS — E1122 Type 2 diabetes mellitus with diabetic chronic kidney disease: Secondary | ICD-10-CM | POA: Diagnosis not present

## 2019-11-13 DIAGNOSIS — I252 Old myocardial infarction: Secondary | ICD-10-CM | POA: Diagnosis not present

## 2019-11-13 DIAGNOSIS — N1832 Chronic kidney disease, stage 3b: Secondary | ICD-10-CM | POA: Diagnosis not present

## 2019-11-13 DIAGNOSIS — E1151 Type 2 diabetes mellitus with diabetic peripheral angiopathy without gangrene: Secondary | ICD-10-CM | POA: Diagnosis not present

## 2019-11-13 DIAGNOSIS — I25119 Atherosclerotic heart disease of native coronary artery with unspecified angina pectoris: Secondary | ICD-10-CM | POA: Diagnosis not present

## 2019-11-13 DIAGNOSIS — I1310 Hypertensive heart and chronic kidney disease without heart failure, with stage 1 through stage 4 chronic kidney disease, or unspecified chronic kidney disease: Secondary | ICD-10-CM | POA: Diagnosis not present

## 2019-11-13 DIAGNOSIS — I69354 Hemiplegia and hemiparesis following cerebral infarction affecting left non-dominant side: Secondary | ICD-10-CM | POA: Diagnosis not present

## 2019-11-13 DIAGNOSIS — I088 Other rheumatic multiple valve diseases: Secondary | ICD-10-CM | POA: Diagnosis not present

## 2019-11-14 DIAGNOSIS — I714 Abdominal aortic aneurysm, without rupture: Secondary | ICD-10-CM | POA: Diagnosis not present

## 2019-11-14 DIAGNOSIS — E1151 Type 2 diabetes mellitus with diabetic peripheral angiopathy without gangrene: Secondary | ICD-10-CM | POA: Diagnosis not present

## 2019-11-14 DIAGNOSIS — I639 Cerebral infarction, unspecified: Secondary | ICD-10-CM | POA: Diagnosis not present

## 2019-11-14 DIAGNOSIS — E785 Hyperlipidemia, unspecified: Secondary | ICD-10-CM | POA: Diagnosis not present

## 2019-11-14 DIAGNOSIS — F17290 Nicotine dependence, other tobacco product, uncomplicated: Secondary | ICD-10-CM | POA: Diagnosis not present

## 2019-11-14 DIAGNOSIS — I10 Essential (primary) hypertension: Secondary | ICD-10-CM | POA: Diagnosis not present

## 2019-11-14 DIAGNOSIS — I25118 Atherosclerotic heart disease of native coronary artery with other forms of angina pectoris: Secondary | ICD-10-CM | POA: Diagnosis not present

## 2019-11-17 DIAGNOSIS — I088 Other rheumatic multiple valve diseases: Secondary | ICD-10-CM | POA: Diagnosis not present

## 2019-11-17 DIAGNOSIS — I1310 Hypertensive heart and chronic kidney disease without heart failure, with stage 1 through stage 4 chronic kidney disease, or unspecified chronic kidney disease: Secondary | ICD-10-CM | POA: Diagnosis not present

## 2019-11-17 DIAGNOSIS — N1832 Chronic kidney disease, stage 3b: Secondary | ICD-10-CM | POA: Diagnosis not present

## 2019-11-17 DIAGNOSIS — E1151 Type 2 diabetes mellitus with diabetic peripheral angiopathy without gangrene: Secondary | ICD-10-CM | POA: Diagnosis not present

## 2019-11-17 DIAGNOSIS — J439 Emphysema, unspecified: Secondary | ICD-10-CM | POA: Diagnosis not present

## 2019-11-17 DIAGNOSIS — I25119 Atherosclerotic heart disease of native coronary artery with unspecified angina pectoris: Secondary | ICD-10-CM | POA: Diagnosis not present

## 2019-11-17 DIAGNOSIS — E1122 Type 2 diabetes mellitus with diabetic chronic kidney disease: Secondary | ICD-10-CM | POA: Diagnosis not present

## 2019-11-17 DIAGNOSIS — I252 Old myocardial infarction: Secondary | ICD-10-CM | POA: Diagnosis not present

## 2019-11-17 DIAGNOSIS — I69354 Hemiplegia and hemiparesis following cerebral infarction affecting left non-dominant side: Secondary | ICD-10-CM | POA: Diagnosis not present

## 2019-11-18 ENCOUNTER — Inpatient Hospital Stay: Payer: Medicare HMO | Admitting: Adult Health

## 2019-11-20 DIAGNOSIS — I252 Old myocardial infarction: Secondary | ICD-10-CM | POA: Diagnosis not present

## 2019-11-20 DIAGNOSIS — I69354 Hemiplegia and hemiparesis following cerebral infarction affecting left non-dominant side: Secondary | ICD-10-CM | POA: Diagnosis not present

## 2019-11-20 DIAGNOSIS — E1151 Type 2 diabetes mellitus with diabetic peripheral angiopathy without gangrene: Secondary | ICD-10-CM | POA: Diagnosis not present

## 2019-11-20 DIAGNOSIS — I25119 Atherosclerotic heart disease of native coronary artery with unspecified angina pectoris: Secondary | ICD-10-CM | POA: Diagnosis not present

## 2019-11-20 DIAGNOSIS — N1832 Chronic kidney disease, stage 3b: Secondary | ICD-10-CM | POA: Diagnosis not present

## 2019-11-20 DIAGNOSIS — I088 Other rheumatic multiple valve diseases: Secondary | ICD-10-CM | POA: Diagnosis not present

## 2019-11-20 DIAGNOSIS — I1310 Hypertensive heart and chronic kidney disease without heart failure, with stage 1 through stage 4 chronic kidney disease, or unspecified chronic kidney disease: Secondary | ICD-10-CM | POA: Diagnosis not present

## 2019-11-20 DIAGNOSIS — E1122 Type 2 diabetes mellitus with diabetic chronic kidney disease: Secondary | ICD-10-CM | POA: Diagnosis not present

## 2019-11-20 DIAGNOSIS — J439 Emphysema, unspecified: Secondary | ICD-10-CM | POA: Diagnosis not present

## 2019-11-25 DIAGNOSIS — I252 Old myocardial infarction: Secondary | ICD-10-CM | POA: Diagnosis not present

## 2019-11-25 DIAGNOSIS — I25119 Atherosclerotic heart disease of native coronary artery with unspecified angina pectoris: Secondary | ICD-10-CM | POA: Diagnosis not present

## 2019-11-25 DIAGNOSIS — I69354 Hemiplegia and hemiparesis following cerebral infarction affecting left non-dominant side: Secondary | ICD-10-CM | POA: Diagnosis not present

## 2019-11-25 DIAGNOSIS — N1832 Chronic kidney disease, stage 3b: Secondary | ICD-10-CM | POA: Diagnosis not present

## 2019-11-25 DIAGNOSIS — I088 Other rheumatic multiple valve diseases: Secondary | ICD-10-CM | POA: Diagnosis not present

## 2019-11-25 DIAGNOSIS — J439 Emphysema, unspecified: Secondary | ICD-10-CM | POA: Diagnosis not present

## 2019-11-25 DIAGNOSIS — I1310 Hypertensive heart and chronic kidney disease without heart failure, with stage 1 through stage 4 chronic kidney disease, or unspecified chronic kidney disease: Secondary | ICD-10-CM | POA: Diagnosis not present

## 2019-11-25 DIAGNOSIS — E1151 Type 2 diabetes mellitus with diabetic peripheral angiopathy without gangrene: Secondary | ICD-10-CM | POA: Diagnosis not present

## 2019-11-25 DIAGNOSIS — E1122 Type 2 diabetes mellitus with diabetic chronic kidney disease: Secondary | ICD-10-CM | POA: Diagnosis not present

## 2019-12-09 DIAGNOSIS — I25119 Atherosclerotic heart disease of native coronary artery with unspecified angina pectoris: Secondary | ICD-10-CM | POA: Diagnosis not present

## 2019-12-09 DIAGNOSIS — J439 Emphysema, unspecified: Secondary | ICD-10-CM | POA: Diagnosis not present

## 2019-12-09 DIAGNOSIS — N1832 Chronic kidney disease, stage 3b: Secondary | ICD-10-CM | POA: Diagnosis not present

## 2019-12-09 DIAGNOSIS — I088 Other rheumatic multiple valve diseases: Secondary | ICD-10-CM | POA: Diagnosis not present

## 2019-12-09 DIAGNOSIS — I1310 Hypertensive heart and chronic kidney disease without heart failure, with stage 1 through stage 4 chronic kidney disease, or unspecified chronic kidney disease: Secondary | ICD-10-CM | POA: Diagnosis not present

## 2019-12-09 DIAGNOSIS — I69354 Hemiplegia and hemiparesis following cerebral infarction affecting left non-dominant side: Secondary | ICD-10-CM | POA: Diagnosis not present

## 2019-12-09 DIAGNOSIS — I252 Old myocardial infarction: Secondary | ICD-10-CM | POA: Diagnosis not present

## 2019-12-09 DIAGNOSIS — E1122 Type 2 diabetes mellitus with diabetic chronic kidney disease: Secondary | ICD-10-CM | POA: Diagnosis not present

## 2019-12-09 DIAGNOSIS — E1151 Type 2 diabetes mellitus with diabetic peripheral angiopathy without gangrene: Secondary | ICD-10-CM | POA: Diagnosis not present

## 2019-12-10 ENCOUNTER — Encounter: Payer: Self-pay | Admitting: Adult Health

## 2019-12-10 ENCOUNTER — Ambulatory Visit: Payer: Medicare HMO | Admitting: Adult Health

## 2019-12-10 VITALS — BP 123/63 | HR 77 | Ht 67.0 in | Wt 165.0 lb

## 2019-12-10 DIAGNOSIS — I63311 Cerebral infarction due to thrombosis of right middle cerebral artery: Secondary | ICD-10-CM | POA: Diagnosis not present

## 2019-12-10 DIAGNOSIS — E1159 Type 2 diabetes mellitus with other circulatory complications: Secondary | ICD-10-CM | POA: Diagnosis not present

## 2019-12-10 DIAGNOSIS — E782 Mixed hyperlipidemia: Secondary | ICD-10-CM

## 2019-12-10 DIAGNOSIS — I69354 Hemiplegia and hemiparesis following cerebral infarction affecting left non-dominant side: Secondary | ICD-10-CM

## 2019-12-10 DIAGNOSIS — I10 Essential (primary) hypertension: Secondary | ICD-10-CM | POA: Diagnosis not present

## 2019-12-10 NOTE — Progress Notes (Signed)
[facility] [address] [place]. [place] [address] [phone]       HOSPITAL FOLLOW UP NOTE  Ms. [name] Date of Birth:  [birth year] Medical Record Number:  [id number]   Reason for Referral:  hospital stroke follow up    SUBJECTIVE:   CHIEF COMPLAINT:  Chief Complaint  Patient presents with  . Follow-up    tx rm here for a stoke f/u.Pt is having pain from the hips down and headaches.    HPI:   Ms. [place]ica Franson is a 79 y.o. 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   Stroke work-up revealed right PLIC small infarct likely due to small vessel disease.  Prior stroke history 2016 with residual left-sided weakness.  HTN stable.  LDL 70 and increase atorvastatin dose to 40 mg daily.  A1c 12.0 and recommend close PCP follow-up.  Current tobacco use of smoking cessation counseling provided.  Other stroke risk factors include advanced age, PAD s/p fem pop 2016, family history of stroke, CAD/MI and AAA s/p repair.  Stroke: right PLIC small infarct likely due to small vessel disease.  CT head - No acute intracranial abnormalities. No change from the prior study.  MRI head - 3-4 mm acute infarction in the posterior limb internal capsule on the right. Extensive old ischemic changes elsewhere throughout the brain as outlined above.   CTA H&N - No intracranial arterial occlusion or high-grade stenosis. 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 Moderate stenosis of the right anterior cerebral artery A2 segment. 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   2D Echo - EF 45%  [employer] Virus 2 - negative  LDL - 70  HgbA1c - 12.0  VTE prophylaxis - [place] Heparin  aspirin 81 mg daily and clopidogrel 75 mg daily prior to admission, now on aspirin 81 mg  daily and clopidogrel 75 mg daily. Continue DAPT on discharge  Patient counseled to be compliant with her antithrombotic medications  Ongoing aggressive stroke risk factor management  Therapy recommendations: SNF - pt refused  Disposition:  HH PT/OT/aide  Today, 12/10/2019, Ms. Franey is being seen for hospital follow-up accompanied by family friend.  Residual deficits of worsened left hemiparesis from baseline but does endorse slow improvement.  Recently completed home health therapy and is interested in pursuing outpatient therapy.  Remains on DAPT with aspirin and Plavix without bleeding or bruising.  Continues on atorvastatin 40 mg daily but does report lower extremity ache/pain since increasing dosage.  Blood pressure today 123/63.  Glucose levels stable per patient.  Reports blurred vision prior to stroke slightly worsened post stroke -has not had recent follow-up with ophthalmologist.  No concerns at this time.      ROS:   14 system review of systems performed and negative with exception of see HPI  PMH:  Past Medical History:  Diagnosis Date  . AAA (abdominal aortic aneurysm) ([place])   . Aneurysm ([place])   . Arthritis    osteoarthritis  . Constipation   . Diabetes mellitus    Type 2, on Glimiperide  . GERD (gastroesophageal reflux disease)   . Glaucoma   . Hyperlipidemia   . Hypertension   . Kidney stones   . Myocardial infarction ([place])   . Peripheral vascular disease ([place])   . Pneumonia   . Stroke [facility])  01/2015   with left side weakness   . TIA (transient ischemic attack)   . Urinary incontinence     PSH:  Past Surgical History:  Procedure Laterality Date  . ABDOMINAL AORTAGRAM N/A 03/13/2014   Procedure: ABDOMINAL Maxcine Ham;  Surgeon: Elam Dutch, MD;  Location: [facility] CATH LAB;  Service: Cardiovascular;  Laterality: N/A;  . ABDOMINAL AORTIC ANEURYSM REPAIR    . ABDOMINAL AORTIC ANEURYSM REPAIR  ?2012  . ABDOMINAL HYSTERECTOMY     partial  . BACK SURGERY    .  CARDIAC CATHETERIZATION    . CORONARY ANGIOPLASTY  03/2015  . EYE SURGERY Right    laser surgery for blood behind eye, loss of sight  . FEMORAL-POPLITEAL BYPASS GRAFT Left 07/06/2014   Procedure: BYPASS GRAFT FEMORAL-POPLITEAL ARTERY;  Surgeon: Elam Dutch, MD;  Location: [place];  Service: Vascular;  Laterality: Left;  . HERNIA REPAIR    . JOINT REPLACEMENT    . LEFT HEART CATHETERIZATION WITH CORONARY ANGIOGRAM N/A 04/12/2014   Procedure: LEFT HEART CATHETERIZATION WITH CORONARY ANGIOGRAM;  Surgeon: Clent Demark, MD;  Location: [place] CATH LAB;  Service: Cardiovascular;  Laterality: N/A;  . PERCUTANEOUS CORONARY STENT INTERVENTION (PCI-S)  04/12/2014   Procedure: PERCUTANEOUS CORONARY STENT INTERVENTION (PCI-S);  Surgeon: Clent Demark, MD;  Location: [facility] CATH LAB;  Service: Cardiovascular;;  prox and mid RCA  . PERIPHERAL VASCULAR INTERVENTION  06/25/2019   Procedure: PERIPHERAL VASCULAR INTERVENTION;  Surgeon: Elam Dutch, MD;  Location: [place] CV LAB;  Service: Cardiovascular;;  Lt Renal and Rt Renal  . RENAL ANGIOGRAPHY Bilateral 06/25/2019   Procedure: RENAL ANGIOGRAPHY;  Surgeon: Elam Dutch, MD;  Location: [place] CV LAB;  Service: Cardiovascular;  Laterality: Bilateral;  . TOTAL HIP ARTHROPLASTY Left 05/18/2015  . TOTAL HIP ARTHROPLASTY Left 05/18/2015   Procedure: LEFT TOTAL HIP ARTHROPLASTY ANTERIOR APPROACH;  Surgeon: Mcarthur Rossetti, MD;  Location: [place];  Service: Orthopedics;  Laterality: Left;  . TOTAL KNEE ARTHROPLASTY Left ~ 2003  . TUBAL LIGATION    . uterine tumor    . VENTRAL HERNIA REPAIR      Social History:  Social History   Socioeconomic History  . Marital status: Widowed    Spouse name: Not on file  . Number of children: Not on file  . Years of education: Not on file  . Highest education level: Not on file  Occupational History  . Not on file  Tobacco Use  . Smoking status: Current Every Day Smoker    Packs/day: 0.50     Years: 40.00    Pack years: 20.00    Types: Cigarettes  . Smokeless tobacco: Never Used  Vaping Use  . Vaping Use: Never used  Substance and Sexual Activity  . Alcohol use: No    Alcohol/week: 0.0 standard drinks  . Drug use: No  . Sexual activity: Not on file  Other Topics Concern  . Not on file  Social History Narrative  . Not on file   Social Determinants of Health   Financial Resource Strain:   . Difficulty of Paying Living Expenses:   Food Insecurity:   . Worried About Charity fundraiser in the Last Year:   . Arboriculturist in the Last Year:   Transportation Needs:   . Film/video editor (Medical):   Marland Kitchen Lack of Transportation (Non-Medical):   Physical Activity:   . Days of Exercise per Week:   . Minutes of Exercise per  Session:   Stress:   . Feeling of Stress :   Social Connections:   . Frequency of Communication with Friends and Family:   . Frequency of Social Gatherings with Friends and Family:   . Attends Religious Services:   . Active Member of Clubs or Organizations:   . Attends Archivist Meetings:   Marland Kitchen Marital Status:   Intimate Partner Violence:   . Fear of Current or Ex-Partner:   . Emotionally Abused:   Marland Kitchen Physically Abused:   . Sexually Abused:     Family History:  Family History  Problem Relation Age of Onset  . Diabetes Mother   . Stroke Mother   . Hypertension Father   . Stroke Father   . Hyperlipidemia Sister   . Hypertension Sister   . Aneurysm Sister   . Hyperlipidemia Brother   . Hypertension Brother     Medications:   Current Outpatient Medications on File Prior to Visit  Medication Sig Dispense Refill  . acetaminophen (TYLENOL) 650 MG CR tablet Take 650 mg by mouth at bedtime as needed for pain.     Marland Kitchen albuterol (PROVENTIL HFA;VENTOLIN HFA) 108 (90 Base) MCG/ACT inhaler Inhale 2 puffs into the lungs every 4 (four) hours as needed for wheezing or shortness of breath (or coughing). 1 Inhaler 0  . amLODipine (NORVASC) 5  MG tablet Take 5 mg by mouth daily.    Marland Kitchen aspirin EC 81 MG tablet Take 1 tablet (81 mg total) by mouth daily. (Patient taking differently: Take 243 mg by mouth daily as needed (chest pain). ) 150 tablet 2  . atorvastatin (LIPITOR) 40 MG tablet Take 1 tablet (40 mg total) by mouth daily. 30 tablet 1  . blood glucose meter kit and supplies KIT Dispense based on patient and insurance preference. Use up to four times daily as directed. (FOR ICD-9 250.00, 250.01). 1 each 0  . Blood Glucose Monitoring Suppl (BAYER BREEZE 2 SYSTEM) w/Device KIT 1 each by Does not apply route 2 (two) times daily. 1 kit 0  . busPIRone (BUSPAR) 5 MG tablet Take 1 tablet (5 mg total) by mouth 2 (two) times daily. 60 tablet 1  . clopidogrel (PLAVIX) 75 MG tablet Take 75 mg by mouth daily.    Marland Kitchen esomeprazole (NEXIUM) 40 MG capsule Take 40 mg by mouth daily as needed (acid reflux).     Marland Kitchen glimepiride (AMARYL) 2 MG tablet Take 1 tablet (2 mg total) by mouth every morning. 30 tablet 1  . isosorbide mononitrate (IMDUR) 60 MG 24 hr tablet Take 60 mg by mouth daily.  3  . metoprolol succinate (TOPROL-XL) 100 MG 24 hr tablet Take 100 mg by mouth daily.    . nitroGLYCERIN (NITROSTAT) 0.4 MG SL tablet Place 0.4 mg under the tongue every 5 (five) minutes as needed for chest pain.    Vladimir Faster Glycol-Propyl Glycol (LUBRICANT EYE DROPS) 0.4-0.3 % SOLN Place 1 drop into both eyes 3 (three) times daily as needed (dry/irritated eyes.). [employer] Lomb Advanced Eye Relief Dry Eye Lubricant Eye Drops    . spironolactone (ALDACTONE) 25 MG tablet Take 25 mg by mouth daily.     Current Facility-Administered Medications on File Prior to Visit  Medication Dose Route Frequency Provider Last Rate Last Admin  . promethazine (PHENERGAN) injection 12.5 mg  12.5 mg Intravenous Q6H PRN Elam Dutch, MD        Allergies:   Allergies  Allergen Reactions  . Keflex [Cephalexin] Shortness Of  Breath and Other (See Comments)    Chest pain, headache  .  Lipitor [Atorvastatin] Shortness Of Breath and Other (See Comments)    Severe pain/cramping in legs- "I cannot walk, talk, or breathe" (pt is currently taking 20 mg daily 10/11/19)  . Codeine Other (See Comments)    Disrupted patient's equilibrium- "Made my world flip upside down"  . Hydrochlorothiazide Other (See Comments)    Other reaction(s): lightheaded, nausea  . Hydrocodone-Acetaminophen Other (See Comments)    Other reaction(s): "get crazy"  . Metformin Hcl Other (See Comments)    Other reaction(s): upset stomach  . Tramadol Hcl Other (See Comments)    Other reaction(s): "get crazy"  . Azor [Amlodipine-Olmesartan] Swelling, Palpitations and Rash    Amlodipine alone resumed 12/2013, tolerating  . Lisinopril Cough  . Penicillins Dermatitis and Other (See Comments)    Did it involve swelling of the face/tongue/throat, SOB, or low BP? No, just worsened eczema Did it involve sudden or severe rash/hives, skin peeling, or any reaction on the inside of your mouth or nose? No Did you need to seek medical attention at a hospital or doctor's office? No When did it last happen? "More than 10 years ago" If all above answers are "NO", may proceed with cephalosporin use.       OBJECTIVE:  Physical Exam  Vitals:   12/10/19 1351  BP: 123/63  Pulse: 77  Weight: 165 lb (74.8 kg)  Height: '5\' 7"'  (1.702 m)   Body mass index is 25.84 kg/m. No exam data present  No flowsheet data found.   General: well developed, well nourished,  pleasant elderly African-American female, seated, in no evident distress Head: head normocephalic and atraumatic.   Neck: supple with no carotid or supraclavicular bruits Cardiovascular: regular rate and rhythm, no murmurs Musculoskeletal: no deformity Skin:  no rash/petichiae Vascular:  Normal pulses all extremities   Neurologic Exam Mental Status: Awake and fully alert.   Fluent speech and language.  Oriented to place and time. Recent and remote memory  intact. Attention span, concentration and fund of knowledge appropriate. Mood and affect appropriate.  Cranial Nerves: Fundoscopic exam reveals sharp disc margins. Pupils equal, briskly reactive to light. Extraocular movements full without nystagmus. Visual fields full to confrontation. Hearing intact. Facial sensation intact. Face, tongue, palate moves normally and symmetrically.  Motor: Full strength right upper and lower extremity. LUE: 4/5 with decreased hand dexterity LLE: 3/5 hip flexor, 4/5 knee flexion and extension, 4/5 ankle dorsiflexion Sensory.: intact to touch , pinprick , position and vibratory sensation.  Coordination: Rapid alternating movements normal in all extremities except decreased left hand. Finger-to-nose and heel-to-shin performed accurately on right side. Gait and Station: Arises from chair without difficulty. Stance is normal. Gait demonstrates  normal stride length and balance with occasional dragging of left foot and use of Rollator walker.  Tandem gait not attempted. Reflexes: 1+ and symmetric. Toes downgoing.     NIHSS  0 Modified Rankin  2     ASSESSMENT: Sheronica Corey is a 79 y.o. year old female presented with increased left-sided weakness on 10/11/2019 with stroke work-up revealing right PLIC small infarct likely secondary to small vessel disease. Vascular risk factors include HTN, HLD, DM, prior stroke 2016, PAD, CAD/MI, tobacco use and AAA s/p repair.      PLAN:  1. Right PLIC stroke :  -Residual deficit: Increased left hemiparesis from baseline and gait impairment.  Referral placed to outpatient PT/OT for ongoing improvement.  Advised use of Rollator walker  at all times for fall prevention -Continue aspirin 81 mg daily and clopidogrel 75 mg daily  and atorvastatin for secondary stroke prevention.  -Maintain strict control of hypertension with blood pressure goal below 130/90, diabetes with hemoglobin A1c goal below 6.5% and cholesterol with LDL  cholesterol (bad cholesterol) goal below 70 mg/dL.  I also advised the patient to eat a healthy diet with plenty of whole grains, cereals, fruits and vegetables, exercise regularly with at least 30 minutes of continuous activity daily and maintain ideal body weight. 2. HTN: Stable today.  Continue to follow PCP for monitoring management 3. HLD: Recent LDL 70.  Possible statin myalgias with increased dosage.  Recommend decreasing atorvastatin back to 20 mg daily and if pains continue to follow-up with PCP 4. Uncontrolled DMII: Recent A1c 12.0.  Continue to follow with PCP for monitoring and management.  Also recommended scheduling follow-up visit with established retina specialist for blurred vision complaints which have been ongoing 5. Extensive cardiac history: Advised to schedule follow-up with cardiologist for ongoing monitoring and management    Follow up in 3 months or call earlier if needed   I spent 45 minutes of face-to-face and non-face-to-face time with patient and family friend.  This included previsit chart review, lab review, study review, order entry, electronic health record documentation, patient education regarding recent stroke, residual deficits, importance of managing stroke risk factors and answered all questions to patient satisfaction   Frann Rider, [facility]  [facility] [address] [place], [place] [address]  Phone [phone] Fax [phone] Note: This document was prepared with digital dictation and possible smart phrase technology. Any transcriptional errors that result from this process are unintentional.

## 2019-12-10 NOTE — Patient Instructions (Signed)
Referral placed to outpatient PT/OT - you will be called to schedule visit  Continue aspirin 81 mg daily and clopidogrel 75 mg daily  for secondary stroke prevention  Recommend trailing decreasing atorvastatin dose back down to 20mg  daily to see if this helps with pain  Continue to follow up with PCP regarding cholesterol, diabetes and blood pressure management   Schedule follow up with eye doctor regarding visual concerns  Follow up with cardiology as scheduled  Continue to monitor blood pressure at home  Maintain strict control of hypertension with blood pressure goal below 130/90, diabetes with hemoglobin A1c goal below 6.5% and cholesterol with LDL cholesterol (bad cholesterol) goal below 70 mg/dL. I also advised the patient to eat a healthy diet with plenty of whole grains, cereals, fruits and vegetables, exercise regularly and maintain ideal body weight.  Followup in the future with me in 3 months or call earlier if needed       Thank you for coming to see [place] at [facility]. I hope we have been able to provide you high quality care today.  You may receive a patient satisfaction survey over the next few weeks. We would appreciate your feedback and comments so that we may continue to improve ourselves and the health of our patients.

## 2019-12-11 NOTE — Progress Notes (Signed)
I agree with the above plan 

## 2019-12-22 DIAGNOSIS — I639 Cerebral infarction, unspecified: Secondary | ICD-10-CM | POA: Diagnosis not present

## 2019-12-22 DIAGNOSIS — E1121 Type 2 diabetes mellitus with diabetic nephropathy: Secondary | ICD-10-CM | POA: Diagnosis not present

## 2019-12-22 DIAGNOSIS — K219 Gastro-esophageal reflux disease without esophagitis: Secondary | ICD-10-CM | POA: Diagnosis not present

## 2019-12-22 DIAGNOSIS — F339 Major depressive disorder, recurrent, unspecified: Secondary | ICD-10-CM | POA: Diagnosis not present

## 2020-02-13 DIAGNOSIS — E1151 Type 2 diabetes mellitus with diabetic peripheral angiopathy without gangrene: Secondary | ICD-10-CM | POA: Diagnosis not present

## 2020-02-13 DIAGNOSIS — I714 Abdominal aortic aneurysm, without rupture: Secondary | ICD-10-CM | POA: Diagnosis not present

## 2020-02-13 DIAGNOSIS — E785 Hyperlipidemia, unspecified: Secondary | ICD-10-CM | POA: Diagnosis not present

## 2020-02-13 DIAGNOSIS — I25118 Atherosclerotic heart disease of native coronary artery with other forms of angina pectoris: Secondary | ICD-10-CM | POA: Diagnosis not present

## 2020-02-13 DIAGNOSIS — F17290 Nicotine dependence, other tobacco product, uncomplicated: Secondary | ICD-10-CM | POA: Diagnosis not present

## 2020-02-13 DIAGNOSIS — I639 Cerebral infarction, unspecified: Secondary | ICD-10-CM | POA: Diagnosis not present

## 2020-02-13 DIAGNOSIS — I10 Essential (primary) hypertension: Secondary | ICD-10-CM | POA: Diagnosis not present

## 2020-03-31 DIAGNOSIS — M25539 Pain in unspecified wrist: Secondary | ICD-10-CM | POA: Diagnosis not present

## 2020-03-31 DIAGNOSIS — I639 Cerebral infarction, unspecified: Secondary | ICD-10-CM | POA: Diagnosis not present

## 2020-03-31 DIAGNOSIS — I2510 Atherosclerotic heart disease of native coronary artery without angina pectoris: Secondary | ICD-10-CM | POA: Diagnosis not present

## 2020-03-31 DIAGNOSIS — F331 Major depressive disorder, recurrent, moderate: Secondary | ICD-10-CM | POA: Diagnosis not present

## 2020-03-31 DIAGNOSIS — I10 Essential (primary) hypertension: Secondary | ICD-10-CM | POA: Diagnosis not present

## 2020-03-31 DIAGNOSIS — E11311 Type 2 diabetes mellitus with unspecified diabetic retinopathy with macular edema: Secondary | ICD-10-CM | POA: Diagnosis not present

## 2020-04-01 ENCOUNTER — Encounter: Payer: Self-pay | Admitting: Adult Health

## 2020-04-01 ENCOUNTER — Ambulatory Visit: Payer: Medicare HMO | Admitting: Adult Health

## 2020-04-01 NOTE — Progress Notes (Deleted)
[facility] [address] [place]. [place] [address] [phone]       STROKE FOLLOW UP NOTE  [name] Date of Birth:  November 12, 1940 Medical Record Number:  [id number]   Reason for Referral: stroke follow up    SUBJECTIVE:   CHIEF COMPLAINT:  No chief complaint on file.   HPI:   Today, 04/01/2020, [name] returns for stroke follow-up.  Reports residual ***.  Denies new or worsening stroke/TIA symptoms.  Remains on aspirin and Plavix without bleeding or bruising.  Remains on atorvastatin without myalgias.  Blood pressure today ***.  Glucose levels ***.  No concerns at this time.    History provided for reference purposes only Update 12/10/2019 JM: [name] is being seen for hospital follow-up accompanied by family friend.  Residual deficits of worsened left hemiparesis from baseline but does endorse slow improvement.  Recently completed home health therapy and is interested in pursuing outpatient therapy.  Remains on DAPT with aspirin and Plavix without bleeding or bruising.  Continues on atorvastatin 40 mg daily but does report lower extremity ache/pain since increasing dosage.  Blood pressure today 123/63.  Glucose levels stable per patient.  Reports blurred vision prior to stroke slightly worsened post stroke -has not had recent follow-up with ophthalmologist.  No concerns at this time.  Stroke admission 10/11/2019 [name] is a 79 y.o. 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   Stroke work-up revealed right PLIC small infarct likely due to small vessel disease.  Prior stroke history 2016 with residual left-sided weakness.  HTN stable.  LDL 70 and increase atorvastatin dose to 40 mg daily.  A1c 12.0 and recommend close PCP follow-up.  Current tobacco use of smoking cessation counseling provided.  Other  stroke risk factors include advanced age, PAD s/p fem pop 2016, family history of stroke, CAD/MI and AAA s/p repair.  Stroke: right PLIC small infarct likely due to small vessel disease.  CT head - No acute intracranial abnormalities. No change from the prior study.  MRI head - 3-4 mm acute infarction in the posterior limb internal capsule on the right. Extensive old ischemic changes elsewhere throughout the brain as outlined above.   CTA H&N - No intracranial arterial occlusion or high-grade stenosis. 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 Moderate stenosis of the right anterior cerebral artery A2 segment. 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   2D Echo - EF 45%  [employer] Virus 2 - negative  LDL - 70  HgbA1c - 12.0  VTE prophylaxis -  Heparin  aspirin 81 mg daily and clopidogrel 75 mg daily prior to admission, now on aspirin 81 mg daily and clopidogrel 75 mg daily. Continue DAPT on discharge  Patient counseled to be compliant with her antithrombotic medications  Ongoing aggressive stroke risk factor management  Therapy recommendations: SNF - pt refused  Disposition:  HH PT/OT/aide        ROS:   14 system review of systems performed and negative with exception of see HPI  PMH:  Past Medical History:  Diagnosis Date  . AAA (abdominal aortic aneurysm) ([place])   . Aneurysm ([place])   . Arthritis    osteoarthritis  . Constipation   . Diabetes mellitus    Type 2, on Glimiperide  . GERD (gastroesophageal reflux disease)   .  Glaucoma   . Hyperlipidemia   . Hypertension   . Kidney stones   . Myocardial infarction ([place])   . Peripheral vascular disease ([place])   . Pneumonia   . Stroke ([place]) 01/2015   with left side weakness   . TIA (transient ischemic attack)   . Urinary incontinence     PSH:  Past Surgical History:  Procedure Laterality Date  . ABDOMINAL AORTAGRAM N/A 03/13/2014    Procedure: ABDOMINAL Maxcine Ham;  Surgeon: Elam Dutch, MD;  Location: [facility] CATH LAB;  Service: Cardiovascular;  Laterality: N/A;  . ABDOMINAL AORTIC ANEURYSM REPAIR    . ABDOMINAL AORTIC ANEURYSM REPAIR  ?2012  . ABDOMINAL HYSTERECTOMY     partial  . BACK SURGERY    . CARDIAC CATHETERIZATION    . CORONARY ANGIOPLASTY  03/2015  . EYE SURGERY Right    laser surgery for blood behind eye, loss of sight  . FEMORAL-POPLITEAL BYPASS GRAFT Left 07/06/2014   Procedure: BYPASS GRAFT FEMORAL-POPLITEAL ARTERY;  Surgeon: Elam Dutch, MD;  Location: [facility];  Service: Vascular;  Laterality: Left;  . HERNIA REPAIR    . JOINT REPLACEMENT    . LEFT HEART CATHETERIZATION WITH CORONARY ANGIOGRAM N/A 04/12/2014   Procedure: LEFT HEART CATHETERIZATION WITH CORONARY ANGIOGRAM;  Surgeon: Clent Demark, MD;  Location: [facility] CATH LAB;  Service: Cardiovascular;  Laterality: N/A;  . PERCUTANEOUS CORONARY STENT INTERVENTION (PCI-S)  04/12/2014   Procedure: PERCUTANEOUS CORONARY STENT INTERVENTION (PCI-S);  Surgeon: Clent Demark, MD;  Location: [facility] CATH LAB;  Service: Cardiovascular;;  prox and mid RCA  . PERIPHERAL VASCULAR INTERVENTION  06/25/2019   Procedure: PERIPHERAL VASCULAR INTERVENTION;  Surgeon: Elam Dutch, MD;  Location: [facility] CV LAB;  Service: Cardiovascular;;  Lt Renal and Rt Renal  . RENAL ANGIOGRAPHY Bilateral 06/25/2019   Procedure: RENAL ANGIOGRAPHY;  Surgeon: Elam Dutch, MD;  Location: [facility] CV LAB;  Service: Cardiovascular;  Laterality: Bilateral;  . TOTAL HIP ARTHROPLASTY Left 05/18/2015  . TOTAL HIP ARTHROPLASTY Left 05/18/2015   Procedure: LEFT TOTAL HIP ARTHROPLASTY ANTERIOR APPROACH;  Surgeon: Mcarthur Rossetti, MD;  Location: [facility];  Service: Orthopedics;  Laterality: Left;  . TOTAL KNEE ARTHROPLASTY Left ~ 2003  . TUBAL LIGATION    . uterine tumor    . VENTRAL HERNIA REPAIR      Social History:  Social History   Socioeconomic History  . Marital  status: Widowed    Spouse name: Not on file  . Number of children: Not on file  . Years of education: Not on file  . Highest education level: Not on file  Occupational History  . Not on file  Tobacco Use  . Smoking status: Current Every Day Smoker    Packs/day: 0.50    Years: 40.00    Pack years: 20.00    Types: Cigarettes  . Smokeless tobacco: Never Used  Vaping Use  . Vaping Use: Never used  Substance and Sexual Activity  . Alcohol use: No    Alcohol/week: 0.0 standard drinks  . Drug use: No  . Sexual activity: Not on file  Other Topics Concern  . Not on file  Social History Narrative  . Not on file   Social Determinants of Health   Financial Resource Strain:   . Difficulty of Paying Living Expenses: Not on file  Food Insecurity:   . Worried About Charity fundraiser in the Last Year: Not on file  . Ran Out of Food in  the Last Year: Not on file  Transportation Needs:   . Lack of Transportation (Medical): Not on file  . Lack of Transportation (Non-Medical): Not on file  Physical Activity:   . Days of Exercise per Week: Not on file  . Minutes of Exercise per Session: Not on file  Stress:   . Feeling of Stress : Not on file  Social Connections:   . Frequency of Communication with Friends and Family: Not on file  . Frequency of Social Gatherings with Friends and Family: Not on file  . Attends Religious Services: Not on file  . Active Member of Clubs or Organizations: Not on file  . Attends Archivist Meetings: Not on file  . Marital Status: Not on file  Intimate Partner Violence:   . Fear of Current or Ex-Partner: Not on file  . Emotionally Abused: Not on file  . Physically Abused: Not on file  . Sexually Abused: Not on file    Family History:  Family History  Problem Relation Age of Onset  . Diabetes Mother   . Stroke Mother   . Hypertension Father   . Stroke Father   . Hyperlipidemia Sister   . Hypertension Sister   . Aneurysm Sister   .  Hyperlipidemia Brother   . Hypertension Brother     Medications:   Current Outpatient Medications on File Prior to Visit  Medication Sig Dispense Refill  . acetaminophen (TYLENOL) 650 MG CR tablet Take 650 mg by mouth at bedtime as needed for pain.     Marland Kitchen albuterol (PROVENTIL HFA;VENTOLIN HFA) 108 (90 Base) MCG/ACT inhaler Inhale 2 puffs into the lungs every 4 (four) hours as needed for wheezing or shortness of breath (or coughing). 1 Inhaler 0  . amLODipine (NORVASC) 5 MG tablet Take 5 mg by mouth daily.    Marland Kitchen aspirin EC 81 MG tablet Take 1 tablet (81 mg total) by mouth daily. (Patient taking differently: Take 243 mg by mouth daily as needed (chest pain). ) 150 tablet 2  . atorvastatin (LIPITOR) 40 MG tablet Take 1 tablet (40 mg total) by mouth daily. 30 tablet 1  . blood glucose meter kit and supplies KIT Dispense based on patient and insurance preference. Use up to four times daily as directed. (FOR ICD-9 250.00, 250.01). 1 each 0  . Blood Glucose Monitoring Suppl (BAYER BREEZE 2 SYSTEM) w/Device KIT 1 each by Does not apply route 2 (two) times daily. 1 kit 0  . busPIRone (BUSPAR) 5 MG tablet Take 1 tablet (5 mg total) by mouth 2 (two) times daily. 60 tablet 1  . clopidogrel (PLAVIX) 75 MG tablet Take 75 mg by mouth daily.    Marland Kitchen esomeprazole (NEXIUM) 40 MG capsule Take 40 mg by mouth daily as needed (acid reflux).     Marland Kitchen glimepiride (AMARYL) 2 MG tablet Take 1 tablet (2 mg total) by mouth every morning. 30 tablet 1  . isosorbide mononitrate (IMDUR) 60 MG 24 hr tablet Take 60 mg by mouth daily.  3  . metoprolol succinate (TOPROL-XL) 100 MG 24 hr tablet Take 100 mg by mouth daily.    . nitroGLYCERIN (NITROSTAT) 0.4 MG SL tablet Place 0.4 mg under the tongue every 5 (five) minutes as needed for chest pain.    Vladimir Faster Glycol-Propyl Glycol (LUBRICANT EYE DROPS) 0.4-0.3 % SOLN Place 1 drop into both eyes 3 (three) times daily as needed (dry/irritated eyes.). [employer] Advanced Eye Relief Dry  Eye Lubricant Eye Drops    .  spironolactone (ALDACTONE) 25 MG tablet Take 25 mg by mouth daily.     Current Facility-Administered Medications on File Prior to Visit  Medication Dose Route Frequency Provider Last Rate Last Admin  . promethazine (PHENERGAN) injection 12.5 mg  12.5 mg Intravenous Q6H PRN Elam Dutch, MD        Allergies:   Allergies  Allergen Reactions  . Keflex [Cephalexin] Shortness Of Breath and Other (See Comments)    Chest pain, headache  . Lipitor [Atorvastatin] Shortness Of Breath and Other (See Comments)    Severe pain/cramping in legs- "I cannot walk, talk, or breathe" (pt is currently taking 20 mg daily 10/11/19)  . Codeine Other (See Comments)    Disrupted patient's equilibrium- "Made my world flip upside down"  . Hydrochlorothiazide Other (See Comments)    Other reaction(s): lightheaded, nausea  . Hydrocodone-Acetaminophen Other (See Comments)    Other reaction(s): "get crazy"  . Metformin Hcl Other (See Comments)    Other reaction(s): upset stomach  . Tramadol Hcl Other (See Comments)    Other reaction(s): "get crazy"  . Azor [Amlodipine-Olmesartan] Swelling, Palpitations and Rash    Amlodipine alone resumed 12/2013, tolerating  . Lisinopril Cough  . Penicillins Dermatitis and Other (See Comments)    Did it involve swelling of the face/tongue/throat, SOB, or low BP? No, just worsened eczema Did it involve sudden or severe rash/hives, skin peeling, or any reaction on the inside of your mouth or nose? No Did you need to seek medical attention at a hospital or doctor's office? No When did it last happen? "More than 10 years ago" If all above answers are "NO", may proceed with cephalosporin use.       OBJECTIVE:  Physical Exam  There were no vitals filed for this visit. There is no height or weight on file to calculate BMI. No exam data present   General: well developed, well nourished,  pleasant elderly African-American female, seated, in no  evident distress Head: head normocephalic and atraumatic.   Neck: supple with no carotid or supraclavicular bruits Cardiovascular: regular rate and rhythm, no murmurs Musculoskeletal: no deformity Skin:  no rash/petichiae Vascular:  Normal pulses all extremities   Neurologic Exam Mental Status: Awake and fully alert.   Fluent speech and language.  Oriented to place and time. Recent and remote memory intact. Attention span, concentration and fund of knowledge appropriate. Mood and affect appropriate.  Cranial Nerves: Pupils equal, briskly reactive to light. Extraocular movements full without nystagmus. Visual fields full to confrontation. Hearing intact. Facial sensation intact. Face, tongue, palate moves normally and symmetrically.  Motor: Full strength right upper and lower extremity. LUE: 4/5 with decreased hand dexterity LLE: 3/5 hip flexor, 4/5 knee flexion and extension, 4/5 ankle dorsiflexion Sensory.: intact to touch , pinprick , position and vibratory sensation.  Coordination: Rapid alternating movements normal in all extremities except decreased left hand. Finger-to-nose and heel-to-shin performed accurately on right side. Gait and Station: Arises from chair without difficulty. Stance is normal. Gait demonstrates  normal stride length and balance with occasional dragging of left foot and use of Rollator walker.  Tandem gait not attempted. Reflexes: 1+ and symmetric. Toes downgoing.        ASSESSMENT: [name] is a 79 y.o. year old female presented with increased left-sided weakness on 10/11/2019 with stroke work-up revealing right PLIC small infarct likely secondary to small vessel disease. Vascular risk factors include HTN, HLD, DM, prior stroke 2016, PAD, CAD/MI, tobacco use and AAA s/p repair.  PLAN:  1. Right PLIC stroke :  a. Residual deficit: Increased left hemiparesis from baseline and gait impairment.  Referral placed to outpatient PT/OT for ongoing  improvement.  Advised use of Rollator walker at all times for fall prevention b. Continue aspirin 81 mg daily and clopidogrel 75 mg daily  and atorvastatin for secondary stroke prevention.  Advised prolonged DAPT not indicated from a stroke standpoint but remains on DAPT per cardiology recommendations c. Discussed secondary stroke prevention measures and importance of close PCP follow-up for aggressive stroke risk factor management 2. HTN: BP goal<130/90.  Controlled on metoprolol, Imdur and amlodipine per PCP 3. HLD: LDL goal <70.  Recent LDL 70.  On atorvastatin per PCP 4. DMII: A1c goal <7.0.  Recent A1c 12.0.  On glimepiride per PCP 5.     Follow up in 3 months or call earlier if needed   I spent 45 minutes of face-to-face and non-face-to-face time with patient and family friend.  This included previsit chart review, lab review, study review, order entry, electronic health record documentation, patient education regarding recent stroke, residual deficits, importance of managing stroke risk factors and answered all questions to patient satisfaction   Frann Rider, [facility]  [facility] [address] [place], [place] [address]  Phone [phone] Fax [phone] Note: This document was prepared with digital dictation and possible smart phrase technology. Any transcriptional errors that result from this process are unintentional.

## 2020-04-08 DIAGNOSIS — H348312 Tributary (branch) retinal vein occlusion, right eye, stable: Secondary | ICD-10-CM | POA: Diagnosis not present

## 2020-04-08 DIAGNOSIS — H2512 Age-related nuclear cataract, left eye: Secondary | ICD-10-CM | POA: Diagnosis not present

## 2020-04-08 DIAGNOSIS — H04123 Dry eye syndrome of bilateral lacrimal glands: Secondary | ICD-10-CM | POA: Diagnosis not present

## 2020-04-08 DIAGNOSIS — Z961 Presence of intraocular lens: Secondary | ICD-10-CM | POA: Diagnosis not present

## 2020-04-08 DIAGNOSIS — E119 Type 2 diabetes mellitus without complications: Secondary | ICD-10-CM | POA: Diagnosis not present

## 2020-04-09 DIAGNOSIS — E1122 Type 2 diabetes mellitus with diabetic chronic kidney disease: Secondary | ICD-10-CM | POA: Diagnosis not present

## 2020-04-09 DIAGNOSIS — I69354 Hemiplegia and hemiparesis following cerebral infarction affecting left non-dominant side: Secondary | ICD-10-CM | POA: Diagnosis not present

## 2020-04-09 DIAGNOSIS — E785 Hyperlipidemia, unspecified: Secondary | ICD-10-CM | POA: Diagnosis not present

## 2020-04-09 DIAGNOSIS — N183 Chronic kidney disease, stage 3 unspecified: Secondary | ICD-10-CM | POA: Diagnosis not present

## 2020-04-09 DIAGNOSIS — I252 Old myocardial infarction: Secondary | ICD-10-CM | POA: Diagnosis not present

## 2020-04-09 DIAGNOSIS — I129 Hypertensive chronic kidney disease with stage 1 through stage 4 chronic kidney disease, or unspecified chronic kidney disease: Secondary | ICD-10-CM | POA: Diagnosis not present

## 2020-04-09 DIAGNOSIS — M1991 Primary osteoarthritis, unspecified site: Secondary | ICD-10-CM | POA: Diagnosis not present

## 2020-04-09 DIAGNOSIS — E1151 Type 2 diabetes mellitus with diabetic peripheral angiopathy without gangrene: Secondary | ICD-10-CM | POA: Diagnosis not present

## 2020-04-09 DIAGNOSIS — I25119 Atherosclerotic heart disease of native coronary artery with unspecified angina pectoris: Secondary | ICD-10-CM | POA: Diagnosis not present

## 2020-04-14 DIAGNOSIS — I129 Hypertensive chronic kidney disease with stage 1 through stage 4 chronic kidney disease, or unspecified chronic kidney disease: Secondary | ICD-10-CM | POA: Diagnosis not present

## 2020-04-14 DIAGNOSIS — E1151 Type 2 diabetes mellitus with diabetic peripheral angiopathy without gangrene: Secondary | ICD-10-CM | POA: Diagnosis not present

## 2020-04-14 DIAGNOSIS — M1991 Primary osteoarthritis, unspecified site: Secondary | ICD-10-CM | POA: Diagnosis not present

## 2020-04-14 DIAGNOSIS — E1122 Type 2 diabetes mellitus with diabetic chronic kidney disease: Secondary | ICD-10-CM | POA: Diagnosis not present

## 2020-04-14 DIAGNOSIS — I69354 Hemiplegia and hemiparesis following cerebral infarction affecting left non-dominant side: Secondary | ICD-10-CM | POA: Diagnosis not present

## 2020-04-14 DIAGNOSIS — I25119 Atherosclerotic heart disease of native coronary artery with unspecified angina pectoris: Secondary | ICD-10-CM | POA: Diagnosis not present

## 2020-04-14 DIAGNOSIS — I252 Old myocardial infarction: Secondary | ICD-10-CM | POA: Diagnosis not present

## 2020-04-14 DIAGNOSIS — N183 Chronic kidney disease, stage 3 unspecified: Secondary | ICD-10-CM | POA: Diagnosis not present

## 2020-04-14 DIAGNOSIS — E785 Hyperlipidemia, unspecified: Secondary | ICD-10-CM | POA: Diagnosis not present

## 2020-04-16 DIAGNOSIS — E1151 Type 2 diabetes mellitus with diabetic peripheral angiopathy without gangrene: Secondary | ICD-10-CM | POA: Diagnosis not present

## 2020-04-16 DIAGNOSIS — I252 Old myocardial infarction: Secondary | ICD-10-CM | POA: Diagnosis not present

## 2020-04-16 DIAGNOSIS — I129 Hypertensive chronic kidney disease with stage 1 through stage 4 chronic kidney disease, or unspecified chronic kidney disease: Secondary | ICD-10-CM | POA: Diagnosis not present

## 2020-04-16 DIAGNOSIS — M1991 Primary osteoarthritis, unspecified site: Secondary | ICD-10-CM | POA: Diagnosis not present

## 2020-04-16 DIAGNOSIS — I69354 Hemiplegia and hemiparesis following cerebral infarction affecting left non-dominant side: Secondary | ICD-10-CM | POA: Diagnosis not present

## 2020-04-16 DIAGNOSIS — N183 Chronic kidney disease, stage 3 unspecified: Secondary | ICD-10-CM | POA: Diagnosis not present

## 2020-04-16 DIAGNOSIS — E785 Hyperlipidemia, unspecified: Secondary | ICD-10-CM | POA: Diagnosis not present

## 2020-04-16 DIAGNOSIS — I25119 Atherosclerotic heart disease of native coronary artery with unspecified angina pectoris: Secondary | ICD-10-CM | POA: Diagnosis not present

## 2020-04-16 DIAGNOSIS — E1122 Type 2 diabetes mellitus with diabetic chronic kidney disease: Secondary | ICD-10-CM | POA: Diagnosis not present

## 2020-04-19 DIAGNOSIS — F331 Major depressive disorder, recurrent, moderate: Secondary | ICD-10-CM | POA: Diagnosis not present

## 2020-04-20 DIAGNOSIS — N183 Chronic kidney disease, stage 3 unspecified: Secondary | ICD-10-CM | POA: Diagnosis not present

## 2020-04-20 DIAGNOSIS — I25119 Atherosclerotic heart disease of native coronary artery with unspecified angina pectoris: Secondary | ICD-10-CM | POA: Diagnosis not present

## 2020-04-20 DIAGNOSIS — E1151 Type 2 diabetes mellitus with diabetic peripheral angiopathy without gangrene: Secondary | ICD-10-CM | POA: Diagnosis not present

## 2020-04-20 DIAGNOSIS — M1991 Primary osteoarthritis, unspecified site: Secondary | ICD-10-CM | POA: Diagnosis not present

## 2020-04-20 DIAGNOSIS — I69354 Hemiplegia and hemiparesis following cerebral infarction affecting left non-dominant side: Secondary | ICD-10-CM | POA: Diagnosis not present

## 2020-04-20 DIAGNOSIS — E1122 Type 2 diabetes mellitus with diabetic chronic kidney disease: Secondary | ICD-10-CM | POA: Diagnosis not present

## 2020-04-20 DIAGNOSIS — I129 Hypertensive chronic kidney disease with stage 1 through stage 4 chronic kidney disease, or unspecified chronic kidney disease: Secondary | ICD-10-CM | POA: Diagnosis not present

## 2020-04-20 DIAGNOSIS — I252 Old myocardial infarction: Secondary | ICD-10-CM | POA: Diagnosis not present

## 2020-04-20 DIAGNOSIS — E785 Hyperlipidemia, unspecified: Secondary | ICD-10-CM | POA: Diagnosis not present

## 2020-04-27 ENCOUNTER — Telehealth: Payer: Self-pay | Admitting: *Deleted

## 2020-04-27 ENCOUNTER — Ambulatory Visit: Payer: Medicare HMO | Admitting: Podiatry

## 2020-04-27 NOTE — Telephone Encounter (Signed)
done

## 2020-04-27 NOTE — Telephone Encounter (Signed)
Patient is not feeling well today and does not think she should come in for her appt. Please cancel and contact to reschedule.

## 2020-04-28 DIAGNOSIS — E1122 Type 2 diabetes mellitus with diabetic chronic kidney disease: Secondary | ICD-10-CM | POA: Diagnosis not present

## 2020-04-28 DIAGNOSIS — I25119 Atherosclerotic heart disease of native coronary artery with unspecified angina pectoris: Secondary | ICD-10-CM | POA: Diagnosis not present

## 2020-04-28 DIAGNOSIS — N183 Chronic kidney disease, stage 3 unspecified: Secondary | ICD-10-CM | POA: Diagnosis not present

## 2020-04-28 DIAGNOSIS — I69354 Hemiplegia and hemiparesis following cerebral infarction affecting left non-dominant side: Secondary | ICD-10-CM | POA: Diagnosis not present

## 2020-04-28 DIAGNOSIS — M1991 Primary osteoarthritis, unspecified site: Secondary | ICD-10-CM | POA: Diagnosis not present

## 2020-04-28 DIAGNOSIS — E785 Hyperlipidemia, unspecified: Secondary | ICD-10-CM | POA: Diagnosis not present

## 2020-04-28 DIAGNOSIS — I252 Old myocardial infarction: Secondary | ICD-10-CM | POA: Diagnosis not present

## 2020-04-28 DIAGNOSIS — I129 Hypertensive chronic kidney disease with stage 1 through stage 4 chronic kidney disease, or unspecified chronic kidney disease: Secondary | ICD-10-CM | POA: Diagnosis not present

## 2020-04-28 DIAGNOSIS — E1151 Type 2 diabetes mellitus with diabetic peripheral angiopathy without gangrene: Secondary | ICD-10-CM | POA: Diagnosis not present

## 2020-04-30 DIAGNOSIS — I25119 Atherosclerotic heart disease of native coronary artery with unspecified angina pectoris: Secondary | ICD-10-CM | POA: Diagnosis not present

## 2020-04-30 DIAGNOSIS — E785 Hyperlipidemia, unspecified: Secondary | ICD-10-CM | POA: Diagnosis not present

## 2020-04-30 DIAGNOSIS — I252 Old myocardial infarction: Secondary | ICD-10-CM | POA: Diagnosis not present

## 2020-04-30 DIAGNOSIS — I129 Hypertensive chronic kidney disease with stage 1 through stage 4 chronic kidney disease, or unspecified chronic kidney disease: Secondary | ICD-10-CM | POA: Diagnosis not present

## 2020-04-30 DIAGNOSIS — N183 Chronic kidney disease, stage 3 unspecified: Secondary | ICD-10-CM | POA: Diagnosis not present

## 2020-04-30 DIAGNOSIS — E1122 Type 2 diabetes mellitus with diabetic chronic kidney disease: Secondary | ICD-10-CM | POA: Diagnosis not present

## 2020-04-30 DIAGNOSIS — E1151 Type 2 diabetes mellitus with diabetic peripheral angiopathy without gangrene: Secondary | ICD-10-CM | POA: Diagnosis not present

## 2020-04-30 DIAGNOSIS — M1991 Primary osteoarthritis, unspecified site: Secondary | ICD-10-CM | POA: Diagnosis not present

## 2020-04-30 DIAGNOSIS — I69354 Hemiplegia and hemiparesis following cerebral infarction affecting left non-dominant side: Secondary | ICD-10-CM | POA: Diagnosis not present

## 2020-05-03 DIAGNOSIS — I252 Old myocardial infarction: Secondary | ICD-10-CM | POA: Diagnosis not present

## 2020-05-03 DIAGNOSIS — E1151 Type 2 diabetes mellitus with diabetic peripheral angiopathy without gangrene: Secondary | ICD-10-CM | POA: Diagnosis not present

## 2020-05-03 DIAGNOSIS — E785 Hyperlipidemia, unspecified: Secondary | ICD-10-CM | POA: Diagnosis not present

## 2020-05-03 DIAGNOSIS — I25119 Atherosclerotic heart disease of native coronary artery with unspecified angina pectoris: Secondary | ICD-10-CM | POA: Diagnosis not present

## 2020-05-03 DIAGNOSIS — I129 Hypertensive chronic kidney disease with stage 1 through stage 4 chronic kidney disease, or unspecified chronic kidney disease: Secondary | ICD-10-CM | POA: Diagnosis not present

## 2020-05-03 DIAGNOSIS — E1122 Type 2 diabetes mellitus with diabetic chronic kidney disease: Secondary | ICD-10-CM | POA: Diagnosis not present

## 2020-05-03 DIAGNOSIS — N183 Chronic kidney disease, stage 3 unspecified: Secondary | ICD-10-CM | POA: Diagnosis not present

## 2020-05-03 DIAGNOSIS — I69354 Hemiplegia and hemiparesis following cerebral infarction affecting left non-dominant side: Secondary | ICD-10-CM | POA: Diagnosis not present

## 2020-05-03 DIAGNOSIS — M1991 Primary osteoarthritis, unspecified site: Secondary | ICD-10-CM | POA: Diagnosis not present

## 2020-05-06 DIAGNOSIS — I25119 Atherosclerotic heart disease of native coronary artery with unspecified angina pectoris: Secondary | ICD-10-CM | POA: Diagnosis not present

## 2020-05-06 DIAGNOSIS — I252 Old myocardial infarction: Secondary | ICD-10-CM | POA: Diagnosis not present

## 2020-05-06 DIAGNOSIS — N183 Chronic kidney disease, stage 3 unspecified: Secondary | ICD-10-CM | POA: Diagnosis not present

## 2020-05-06 DIAGNOSIS — E785 Hyperlipidemia, unspecified: Secondary | ICD-10-CM | POA: Diagnosis not present

## 2020-05-06 DIAGNOSIS — E1151 Type 2 diabetes mellitus with diabetic peripheral angiopathy without gangrene: Secondary | ICD-10-CM | POA: Diagnosis not present

## 2020-05-06 DIAGNOSIS — I69354 Hemiplegia and hemiparesis following cerebral infarction affecting left non-dominant side: Secondary | ICD-10-CM | POA: Diagnosis not present

## 2020-05-06 DIAGNOSIS — I129 Hypertensive chronic kidney disease with stage 1 through stage 4 chronic kidney disease, or unspecified chronic kidney disease: Secondary | ICD-10-CM | POA: Diagnosis not present

## 2020-05-06 DIAGNOSIS — E1122 Type 2 diabetes mellitus with diabetic chronic kidney disease: Secondary | ICD-10-CM | POA: Diagnosis not present

## 2020-05-06 DIAGNOSIS — M1991 Primary osteoarthritis, unspecified site: Secondary | ICD-10-CM | POA: Diagnosis not present

## 2020-05-09 DIAGNOSIS — E1122 Type 2 diabetes mellitus with diabetic chronic kidney disease: Secondary | ICD-10-CM | POA: Diagnosis not present

## 2020-05-09 DIAGNOSIS — M1991 Primary osteoarthritis, unspecified site: Secondary | ICD-10-CM | POA: Diagnosis not present

## 2020-05-09 DIAGNOSIS — I129 Hypertensive chronic kidney disease with stage 1 through stage 4 chronic kidney disease, or unspecified chronic kidney disease: Secondary | ICD-10-CM | POA: Diagnosis not present

## 2020-05-09 DIAGNOSIS — I69354 Hemiplegia and hemiparesis following cerebral infarction affecting left non-dominant side: Secondary | ICD-10-CM | POA: Diagnosis not present

## 2020-05-09 DIAGNOSIS — I25119 Atherosclerotic heart disease of native coronary artery with unspecified angina pectoris: Secondary | ICD-10-CM | POA: Diagnosis not present

## 2020-05-09 DIAGNOSIS — E785 Hyperlipidemia, unspecified: Secondary | ICD-10-CM | POA: Diagnosis not present

## 2020-05-09 DIAGNOSIS — I252 Old myocardial infarction: Secondary | ICD-10-CM | POA: Diagnosis not present

## 2020-05-09 DIAGNOSIS — N183 Chronic kidney disease, stage 3 unspecified: Secondary | ICD-10-CM | POA: Diagnosis not present

## 2020-05-09 DIAGNOSIS — E1151 Type 2 diabetes mellitus with diabetic peripheral angiopathy without gangrene: Secondary | ICD-10-CM | POA: Diagnosis not present

## 2020-05-10 DIAGNOSIS — E1122 Type 2 diabetes mellitus with diabetic chronic kidney disease: Secondary | ICD-10-CM | POA: Diagnosis not present

## 2020-05-10 DIAGNOSIS — E785 Hyperlipidemia, unspecified: Secondary | ICD-10-CM | POA: Diagnosis not present

## 2020-05-10 DIAGNOSIS — I252 Old myocardial infarction: Secondary | ICD-10-CM | POA: Diagnosis not present

## 2020-05-10 DIAGNOSIS — N183 Chronic kidney disease, stage 3 unspecified: Secondary | ICD-10-CM | POA: Diagnosis not present

## 2020-05-10 DIAGNOSIS — E1151 Type 2 diabetes mellitus with diabetic peripheral angiopathy without gangrene: Secondary | ICD-10-CM | POA: Diagnosis not present

## 2020-05-10 DIAGNOSIS — I129 Hypertensive chronic kidney disease with stage 1 through stage 4 chronic kidney disease, or unspecified chronic kidney disease: Secondary | ICD-10-CM | POA: Diagnosis not present

## 2020-05-10 DIAGNOSIS — I69354 Hemiplegia and hemiparesis following cerebral infarction affecting left non-dominant side: Secondary | ICD-10-CM | POA: Diagnosis not present

## 2020-05-10 DIAGNOSIS — M1991 Primary osteoarthritis, unspecified site: Secondary | ICD-10-CM | POA: Diagnosis not present

## 2020-05-10 DIAGNOSIS — I25119 Atherosclerotic heart disease of native coronary artery with unspecified angina pectoris: Secondary | ICD-10-CM | POA: Diagnosis not present

## 2020-05-14 DIAGNOSIS — E785 Hyperlipidemia, unspecified: Secondary | ICD-10-CM | POA: Diagnosis not present

## 2020-05-14 DIAGNOSIS — I10 Essential (primary) hypertension: Secondary | ICD-10-CM | POA: Diagnosis not present

## 2020-05-14 DIAGNOSIS — I639 Cerebral infarction, unspecified: Secondary | ICD-10-CM | POA: Diagnosis not present

## 2020-05-14 DIAGNOSIS — I739 Peripheral vascular disease, unspecified: Secondary | ICD-10-CM | POA: Diagnosis not present

## 2020-05-14 DIAGNOSIS — E119 Type 2 diabetes mellitus without complications: Secondary | ICD-10-CM | POA: Diagnosis not present

## 2020-05-14 DIAGNOSIS — I25118 Atherosclerotic heart disease of native coronary artery with other forms of angina pectoris: Secondary | ICD-10-CM | POA: Diagnosis not present

## 2020-05-14 DIAGNOSIS — I714 Abdominal aortic aneurysm, without rupture: Secondary | ICD-10-CM | POA: Diagnosis not present

## 2020-05-18 DIAGNOSIS — E785 Hyperlipidemia, unspecified: Secondary | ICD-10-CM | POA: Diagnosis not present

## 2020-05-18 DIAGNOSIS — I25119 Atherosclerotic heart disease of native coronary artery with unspecified angina pectoris: Secondary | ICD-10-CM | POA: Diagnosis not present

## 2020-05-18 DIAGNOSIS — N183 Chronic kidney disease, stage 3 unspecified: Secondary | ICD-10-CM | POA: Diagnosis not present

## 2020-05-18 DIAGNOSIS — E1122 Type 2 diabetes mellitus with diabetic chronic kidney disease: Secondary | ICD-10-CM | POA: Diagnosis not present

## 2020-05-18 DIAGNOSIS — E1151 Type 2 diabetes mellitus with diabetic peripheral angiopathy without gangrene: Secondary | ICD-10-CM | POA: Diagnosis not present

## 2020-05-18 DIAGNOSIS — I252 Old myocardial infarction: Secondary | ICD-10-CM | POA: Diagnosis not present

## 2020-05-18 DIAGNOSIS — I69354 Hemiplegia and hemiparesis following cerebral infarction affecting left non-dominant side: Secondary | ICD-10-CM | POA: Diagnosis not present

## 2020-05-18 DIAGNOSIS — M1991 Primary osteoarthritis, unspecified site: Secondary | ICD-10-CM | POA: Diagnosis not present

## 2020-05-18 DIAGNOSIS — I129 Hypertensive chronic kidney disease with stage 1 through stage 4 chronic kidney disease, or unspecified chronic kidney disease: Secondary | ICD-10-CM | POA: Diagnosis not present

## 2020-05-24 DIAGNOSIS — I129 Hypertensive chronic kidney disease with stage 1 through stage 4 chronic kidney disease, or unspecified chronic kidney disease: Secondary | ICD-10-CM | POA: Diagnosis not present

## 2020-05-24 DIAGNOSIS — E1151 Type 2 diabetes mellitus with diabetic peripheral angiopathy without gangrene: Secondary | ICD-10-CM | POA: Diagnosis not present

## 2020-05-24 DIAGNOSIS — I69354 Hemiplegia and hemiparesis following cerebral infarction affecting left non-dominant side: Secondary | ICD-10-CM | POA: Diagnosis not present

## 2020-05-24 DIAGNOSIS — I25119 Atherosclerotic heart disease of native coronary artery with unspecified angina pectoris: Secondary | ICD-10-CM | POA: Diagnosis not present

## 2020-05-24 DIAGNOSIS — M1991 Primary osteoarthritis, unspecified site: Secondary | ICD-10-CM | POA: Diagnosis not present

## 2020-05-24 DIAGNOSIS — N183 Chronic kidney disease, stage 3 unspecified: Secondary | ICD-10-CM | POA: Diagnosis not present

## 2020-05-24 DIAGNOSIS — E785 Hyperlipidemia, unspecified: Secondary | ICD-10-CM | POA: Diagnosis not present

## 2020-05-24 DIAGNOSIS — E1122 Type 2 diabetes mellitus with diabetic chronic kidney disease: Secondary | ICD-10-CM | POA: Diagnosis not present

## 2020-05-24 DIAGNOSIS — I252 Old myocardial infarction: Secondary | ICD-10-CM | POA: Diagnosis not present

## 2020-05-26 DIAGNOSIS — I63311 Cerebral infarction due to thrombosis of right middle cerebral artery: Secondary | ICD-10-CM | POA: Diagnosis not present

## 2020-05-26 DIAGNOSIS — I10 Essential (primary) hypertension: Secondary | ICD-10-CM | POA: Diagnosis not present

## 2020-05-26 DIAGNOSIS — E1121 Type 2 diabetes mellitus with diabetic nephropathy: Secondary | ICD-10-CM | POA: Diagnosis not present

## 2020-05-26 DIAGNOSIS — N182 Chronic kidney disease, stage 2 (mild): Secondary | ICD-10-CM | POA: Diagnosis not present

## 2020-05-26 DIAGNOSIS — E11311 Type 2 diabetes mellitus with unspecified diabetic retinopathy with macular edema: Secondary | ICD-10-CM | POA: Diagnosis not present

## 2020-05-26 DIAGNOSIS — J439 Emphysema, unspecified: Secondary | ICD-10-CM | POA: Diagnosis not present

## 2020-05-26 DIAGNOSIS — I25118 Atherosclerotic heart disease of native coronary artery with other forms of angina pectoris: Secondary | ICD-10-CM | POA: Diagnosis not present

## 2020-05-26 DIAGNOSIS — E785 Hyperlipidemia, unspecified: Secondary | ICD-10-CM | POA: Diagnosis not present

## 2020-05-26 DIAGNOSIS — E1165 Type 2 diabetes mellitus with hyperglycemia: Secondary | ICD-10-CM | POA: Diagnosis not present

## 2020-06-21 DIAGNOSIS — E1165 Type 2 diabetes mellitus with hyperglycemia: Secondary | ICD-10-CM | POA: Diagnosis not present

## 2020-06-21 DIAGNOSIS — E785 Hyperlipidemia, unspecified: Secondary | ICD-10-CM | POA: Diagnosis not present

## 2020-06-21 DIAGNOSIS — J439 Emphysema, unspecified: Secondary | ICD-10-CM | POA: Diagnosis not present

## 2020-06-21 DIAGNOSIS — E11311 Type 2 diabetes mellitus with unspecified diabetic retinopathy with macular edema: Secondary | ICD-10-CM | POA: Diagnosis not present

## 2020-06-21 DIAGNOSIS — I701 Atherosclerosis of renal artery: Secondary | ICD-10-CM | POA: Diagnosis not present

## 2020-06-21 DIAGNOSIS — N182 Chronic kidney disease, stage 2 (mild): Secondary | ICD-10-CM | POA: Diagnosis not present

## 2020-06-21 DIAGNOSIS — F322 Major depressive disorder, single episode, severe without psychotic features: Secondary | ICD-10-CM | POA: Diagnosis not present

## 2020-06-21 DIAGNOSIS — I10 Essential (primary) hypertension: Secondary | ICD-10-CM | POA: Diagnosis not present

## 2020-06-21 DIAGNOSIS — E1121 Type 2 diabetes mellitus with diabetic nephropathy: Secondary | ICD-10-CM | POA: Diagnosis not present

## 2020-06-25 DIAGNOSIS — S6991XA Unspecified injury of right wrist, hand and finger(s), initial encounter: Secondary | ICD-10-CM | POA: Diagnosis not present

## 2020-06-25 DIAGNOSIS — M19131 Post-traumatic osteoarthritis, right wrist: Secondary | ICD-10-CM | POA: Diagnosis not present

## 2020-06-25 DIAGNOSIS — S6992XA Unspecified injury of left wrist, hand and finger(s), initial encounter: Secondary | ICD-10-CM | POA: Diagnosis not present

## 2020-06-29 DIAGNOSIS — M25531 Pain in right wrist: Secondary | ICD-10-CM | POA: Diagnosis not present

## 2020-06-29 DIAGNOSIS — M19031 Primary osteoarthritis, right wrist: Secondary | ICD-10-CM | POA: Diagnosis not present

## 2020-06-29 DIAGNOSIS — S6981XA Other specified injuries of right wrist, hand and finger(s), initial encounter: Secondary | ICD-10-CM | POA: Diagnosis not present

## 2020-08-01 DIAGNOSIS — M5442 Lumbago with sciatica, left side: Secondary | ICD-10-CM | POA: Diagnosis not present

## 2020-08-01 DIAGNOSIS — M5441 Lumbago with sciatica, right side: Secondary | ICD-10-CM | POA: Diagnosis not present

## 2020-08-30 DIAGNOSIS — I10 Essential (primary) hypertension: Secondary | ICD-10-CM | POA: Diagnosis not present

## 2020-08-30 DIAGNOSIS — I739 Peripheral vascular disease, unspecified: Secondary | ICD-10-CM | POA: Diagnosis not present

## 2020-08-30 DIAGNOSIS — E785 Hyperlipidemia, unspecified: Secondary | ICD-10-CM | POA: Diagnosis not present

## 2020-08-30 DIAGNOSIS — I639 Cerebral infarction, unspecified: Secondary | ICD-10-CM | POA: Diagnosis not present

## 2020-08-30 DIAGNOSIS — I714 Abdominal aortic aneurysm, without rupture: Secondary | ICD-10-CM | POA: Diagnosis not present

## 2020-08-30 DIAGNOSIS — I25118 Atherosclerotic heart disease of native coronary artery with other forms of angina pectoris: Secondary | ICD-10-CM | POA: Diagnosis not present

## 2020-08-30 DIAGNOSIS — E119 Type 2 diabetes mellitus without complications: Secondary | ICD-10-CM | POA: Diagnosis not present

## 2020-09-20 ENCOUNTER — Emergency Department (HOSPITAL_COMMUNITY): Payer: Medicare HMO

## 2020-09-20 ENCOUNTER — Encounter (HOSPITAL_COMMUNITY): Payer: Self-pay | Admitting: Radiology

## 2020-09-20 ENCOUNTER — Other Ambulatory Visit: Payer: Self-pay

## 2020-09-20 ENCOUNTER — Emergency Department (HOSPITAL_COMMUNITY)
Admission: EM | Admit: 2020-09-20 | Discharge: 2020-09-20 | Disposition: A | Discharge status: Home / Self Care | Payer: Medicare HMO | Attending: Emergency Medicine | Admitting: Emergency Medicine

## 2020-09-20 DIAGNOSIS — M25511 Pain in right shoulder: Secondary | ICD-10-CM | POA: Diagnosis not present

## 2020-09-20 DIAGNOSIS — Z7902 Long term (current) use of antithrombotics/antiplatelets: Secondary | ICD-10-CM | POA: Diagnosis not present

## 2020-09-20 DIAGNOSIS — R0789 Other chest pain: Secondary | ICD-10-CM

## 2020-09-20 DIAGNOSIS — E785 Hyperlipidemia, unspecified: Secondary | ICD-10-CM | POA: Insufficient documentation

## 2020-09-20 DIAGNOSIS — E1122 Type 2 diabetes mellitus with diabetic chronic kidney disease: Secondary | ICD-10-CM | POA: Diagnosis not present

## 2020-09-20 DIAGNOSIS — Z79899 Other long term (current) drug therapy: Secondary | ICD-10-CM | POA: Insufficient documentation

## 2020-09-20 DIAGNOSIS — Z96652 Presence of left artificial knee joint: Secondary | ICD-10-CM | POA: Insufficient documentation

## 2020-09-20 DIAGNOSIS — N183 Chronic kidney disease, stage 3 unspecified: Secondary | ICD-10-CM | POA: Insufficient documentation

## 2020-09-20 DIAGNOSIS — E1169 Type 2 diabetes mellitus with other specified complication: Secondary | ICD-10-CM | POA: Diagnosis not present

## 2020-09-20 DIAGNOSIS — M25519 Pain in unspecified shoulder: Secondary | ICD-10-CM | POA: Diagnosis not present

## 2020-09-20 DIAGNOSIS — I129 Hypertensive chronic kidney disease with stage 1 through stage 4 chronic kidney disease, or unspecified chronic kidney disease: Secondary | ICD-10-CM | POA: Diagnosis not present

## 2020-09-20 DIAGNOSIS — Z7984 Long term (current) use of oral hypoglycemic drugs: Secondary | ICD-10-CM | POA: Diagnosis not present

## 2020-09-20 DIAGNOSIS — I2510 Atherosclerotic heart disease of native coronary artery without angina pectoris: Secondary | ICD-10-CM | POA: Insufficient documentation

## 2020-09-20 DIAGNOSIS — Z96642 Presence of left artificial hip joint: Secondary | ICD-10-CM | POA: Insufficient documentation

## 2020-09-20 DIAGNOSIS — M79601 Pain in right arm: Secondary | ICD-10-CM | POA: Insufficient documentation

## 2020-09-20 DIAGNOSIS — F17210 Nicotine dependence, cigarettes, uncomplicated: Secondary | ICD-10-CM | POA: Insufficient documentation

## 2020-09-20 DIAGNOSIS — E1165 Type 2 diabetes mellitus with hyperglycemia: Secondary | ICD-10-CM | POA: Diagnosis not present

## 2020-09-20 DIAGNOSIS — R079 Chest pain, unspecified: Secondary | ICD-10-CM | POA: Diagnosis not present

## 2020-09-20 DIAGNOSIS — I10 Essential (primary) hypertension: Secondary | ICD-10-CM | POA: Diagnosis not present

## 2020-09-20 LAB — CBC WITH DIFFERENTIAL/PLATELET
Abs Immature Granulocytes: 0.03 10*3/uL (ref 0.00–0.07)
Basophils Absolute: 0.1 10*3/uL (ref 0.0–0.1)
Basophils Relative: 1 %
Eosinophils Absolute: 0.1 10*3/uL (ref 0.0–0.5)
Eosinophils Relative: 2 %
HCT: 40.6 % (ref 36.0–46.0)
Hemoglobin: 13.2 g/dL (ref 12.0–15.0)
Immature Granulocytes: 1 %
Lymphocytes Relative: 34 %
Lymphs Abs: 2.1 10*3/uL (ref 0.7–4.0)
MCH: 29.8 pg (ref 26.0–34.0)
MCHC: 32.5 g/dL (ref 30.0–36.0)
MCV: 91.6 fL (ref 80.0–100.0)
Monocytes Absolute: 0.4 10*3/uL (ref 0.1–1.0)
Monocytes Relative: 7 %
Neutro Abs: 3.4 10*3/uL (ref 1.7–7.7)
Neutrophils Relative %: 55 %
Platelets: 165 10*3/uL (ref 150–400)
RBC: 4.43 MIL/uL (ref 3.87–5.11)
RDW: 13.8 % (ref 11.5–15.5)
WBC: 6.1 10*3/uL (ref 4.0–10.5)
nRBC: 0 % (ref 0.0–0.2)

## 2020-09-20 LAB — TROPONIN I (HIGH SENSITIVITY)
Troponin I (High Sensitivity): 4 ng/L (ref <18)
Troponin I (High Sensitivity): 5 ng/L (ref <18)

## 2020-09-20 LAB — BASIC METABOLIC PANEL
Anion gap: 8 (ref 5–15)
BUN: 15 mg/dL (ref 8–23)
CO2: 22 mmol/L (ref 22–32)
Calcium: 9.3 mg/dL (ref 8.9–10.3)
Chloride: 106 mmol/L (ref 98–111)
Creatinine, Ser: 1.22 mg/dL — ABNORMAL HIGH (ref 0.44–1.00)
GFR, Estimated: 45 mL/min — ABNORMAL LOW (ref >60)
Glucose, Bld: 251 mg/dL — ABNORMAL HIGH (ref 70–99)
Potassium: 4.4 mmol/L (ref 3.5–5.1)
Sodium: 136 mmol/L (ref 135–145)

## 2020-09-20 LAB — D-DIMER, QUANTITATIVE: D-Dimer, Quant: 3.65 ug/mL-FEU — ABNORMAL HIGH (ref 0.00–0.50)

## 2020-09-20 MED ORDER — IOHEXOL 350 MG/ML SOLN
80.0000 mL | Freq: Once | INTRAVENOUS | Status: AC | PRN
  Administered 2020-09-20: 80 mL via INTRAVENOUS

## 2020-09-20 MED ORDER — NICOTINE 7 MG/24HR TD PT24
7.0000 mg | MEDICATED_PATCH | Freq: Once | TRANSDERMAL | Status: DC
  Administered 2020-09-20: 7 mg via TRANSDERMAL
  Filled 2020-09-20: qty 1

## 2020-09-20 NOTE — ED Provider Notes (Signed)
[facility] EMERGENCY DEPARTMENT Provider Note   CSN: [id number] Arrival date & time: 09/20/20  6720     History Chief Complaint  Patient presents with  . Arm Pain    C/o right upper arm/ armpit pain that was not relived with 324 of aspirin and 2 doses of sublingual nitro.     [name] is a 80 y.o. female.  [name] is a 80 y.o. female with a past history of STEMI, HLD, HTN, stroke, T2DM, and CKD that presented with right shoulder and arm pain. She stated the pain began about one week ago and has been unresolved by tylenol, nitro, and aspirin. She states the pain is a dull ache that is present in the right shoulder and extends down to the forearm. The pain is also present at rest. She denies any recent falls or trauma to the area. She also denies any nausea, vomiting, abdominal pain, diaphoresis, HA, numbness or tingling in her extremities, lightheadedness or dizziness, chest pain, neck pain, vision changes, new or worsening lower extremity edema, cough. She does endorse some DOE for roughly a week. She denies any history of blood clots. Is on plavix and aspirin, but no other anticoagulation.  Does have history of MI but cannot remember if she had similar arm pain with this, but was concerned this may be causing her pain when it did not improve with Tylenol at home, took aspirin and 2 nitro this morning without improvement.        Past Medical History:  Diagnosis Date  . AAA (abdominal aortic aneurysm) ([place])   . Aneurysm ([place])   . Arthritis    osteoarthritis  . Constipation   . Diabetes mellitus    Type 2, on Glimiperide  . GERD (gastroesophageal reflux disease)   . Glaucoma   . Hyperlipidemia   . Hypertension   . Kidney stones   . Myocardial infarction ([place])   . Peripheral vascular disease ([place])   . Pneumonia   . Stroke ([place]) 01/2015   with left side weakness   . TIA (transient ischemic attack)   . Urinary incontinence     Patient Active  Problem List   Diagnosis Date Noted  . CVA (cerebral vascular accident) ([place]) 10/11/2019  . Hypertensive urgency 04/07/2019  . CKD (chronic kidney disease) stage 3, GFR 30-59 ml/min (HCC) 04/07/2019  . Chest pain 04/28/2017  . History of ST elevation myocardial infarction (STEMI) 04/28/2017  . Grief reaction 04/28/2017  . Osteoarthritis of left hip 05/18/2015  . Status post total replacement of left hip 05/18/2015  . Coronary artery disease involving native coronary artery of native heart with angina pectoris ([place]) 04/16/2015  . History of stroke 04/16/2015  . Type 2 diabetes mellitus with circulatory disorder ([place]) 04/16/2015  . Cerebrovascular 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 artery ([place]) 04/16/2015  . HLD (hyperlipidemia)   . Acute CVA (cerebrovascular accident) ([place]) 02/19/2015  . Transient ischemic attack (TIA) 02/18/2015  . Stroke ([place])   . Cerebral infarction due to thrombosis of right middle cerebral artery ([place])   . Acute myocardial infarction of inferolateral wall (HCC) 04/12/2014  . Numbness 03/18/2014  . TIA (transient ischemic attack) 03/18/2014  . DM (diabetes mellitus) type II uncontrolled, periph vascular disorder ([place]) 03/18/2014  . Hyperlipidemia   . Hypertension   . PVD (peripheral vascular disease) ([place]) 03/05/2014  . Transient cerebral ischemia 08/01/2012  . Atherosclerosis of native arteries of extremity with intermittent claudication ([place]) 10/19/2011  .  Unspecified disorders of arteries and arterioles 10/19/2011  . PAD (peripheral artery disease) ([place]) 04/13/2011    Past Surgical History:  Procedure Laterality Date  . ABDOMINAL AORTAGRAM N/A 03/13/2014   Procedure: ABDOMINAL Maxcine Ham;  Surgeon: Elam Dutch, MD;  Location: [facility] CATH LAB;  Service: Cardiovascular;  Laterality: N/A;  . ABDOMINAL AORTIC ANEURYSM REPAIR    . ABDOMINAL AORTIC ANEURYSM REPAIR  ?2012  . ABDOMINAL HYSTERECTOMY     partial  . BACK SURGERY    . CARDIAC  CATHETERIZATION    . CORONARY ANGIOPLASTY  03/2015  . EYE SURGERY Right    laser surgery for blood behind eye, loss of sight  . FEMORAL-POPLITEAL BYPASS GRAFT Left 07/06/2014   Procedure: BYPASS GRAFT FEMORAL-POPLITEAL ARTERY;  Surgeon: Elam Dutch, MD;  Location: [facility];  Service: Vascular;  Laterality: Left;  . HERNIA REPAIR    . JOINT REPLACEMENT    . LEFT HEART CATHETERIZATION WITH CORONARY ANGIOGRAM N/A 04/12/2014   Procedure: LEFT HEART CATHETERIZATION WITH CORONARY ANGIOGRAM;  Surgeon: Clent Demark, MD;  Location: [facility] CATH LAB;  Service: Cardiovascular;  Laterality: N/A;  . PERCUTANEOUS CORONARY STENT INTERVENTION (PCI-S)  04/12/2014   Procedure: PERCUTANEOUS CORONARY STENT INTERVENTION (PCI-S);  Surgeon: Clent Demark, MD;  Location: [facility] CATH LAB;  Service: Cardiovascular;;  prox and mid RCA  . PERIPHERAL VASCULAR INTERVENTION  06/25/2019   Procedure: PERIPHERAL VASCULAR INTERVENTION;  Surgeon: Elam Dutch, MD;  Location: [facility] CV LAB;  Service: Cardiovascular;;  Lt Renal and Rt Renal  . RENAL ANGIOGRAPHY Bilateral 06/25/2019   Procedure: RENAL ANGIOGRAPHY;  Surgeon: Elam Dutch, MD;  Location: [facility] CV LAB;  Service: Cardiovascular;  Laterality: Bilateral;  . TOTAL HIP ARTHROPLASTY Left 05/18/2015  . TOTAL HIP ARTHROPLASTY Left 05/18/2015   Procedure: LEFT TOTAL HIP ARTHROPLASTY ANTERIOR APPROACH;  Surgeon: Mcarthur Rossetti, MD;  Location: [facility];  Service: Orthopedics;  Laterality: Left;  . TOTAL KNEE ARTHROPLASTY Left ~ 2003  . TUBAL LIGATION    . uterine tumor    . VENTRAL HERNIA REPAIR       OB History   No obstetric history on file.     Family History  Problem Relation Age of Onset  . Diabetes Mother   . Stroke Mother   . Hypertension Father   . Stroke Father   . Hyperlipidemia Sister   . Hypertension Sister   . Aneurysm Sister   . Hyperlipidemia Brother   . Hypertension Brother     Social History   Tobacco Use  . Smoking  status: Current Every Day Smoker    Packs/day: 0.50    Years: 40.00    Pack years: 20.00    Types: Cigarettes  . Smokeless tobacco: Never Used  Vaping Use  . Vaping Use: Never used  Substance Use Topics  . Alcohol use: No    Alcohol/week: 0.0 standard drinks  . Drug use: No    Home Medications Prior to Admission medications   Medication Sig Start Date End Date Taking? Authorizing Provider  acetaminophen (TYLENOL) 650 MG CR tablet Take 650 mg by mouth at bedtime as needed for pain.     [provider]  albuterol (PROVENTIL HFA;VENTOLIN HFA) 108 (90 Base) MCG/ACT inhaler Inhale 2 puffs into the lungs every 4 (four) hours as needed for wheezing or shortness of breath (or coughing). 4/97/02   Delora Fuel, MD  amLODipine (NORVASC) 5 MG tablet Take 5 mg by mouth daily. 09/01/19   [provider]  aspirin EC 81 MG tablet Take 1 tablet (81 mg total) by mouth daily. Patient taking differently: Take 243 mg by mouth daily as needed (chest pain).  06/25/19   Elam Dutch, MD  atorvastatin (LIPITOR) 40 MG tablet Take 1 tablet (40 mg total) by mouth daily. 10/15/19 12/14/19  Georgette Shell, MD  blood glucose meter kit and supplies KIT Dispense based on patient and insurance preference. Use up to four times daily as directed. (FOR ICD-9 250.00, 250.01). 10/15/19   Georgette Shell, MD  Blood Glucose Monitoring Suppl (BAYER BREEZE 2 SYSTEM) w/Device KIT 1 each by Does not apply route 2 (two) times daily. 10/15/19   Georgette Shell, MD  busPIRone (BUSPAR) 5 MG tablet Take 1 tablet (5 mg total) by mouth 2 (two) times daily. 10/15/19   Georgette Shell, MD  clopidogrel (PLAVIX) 75 MG tablet Take 75 mg by mouth daily.    [provider]  esomeprazole (NEXIUM) 40 MG capsule Take 40 mg by mouth daily as needed (acid reflux).     [provider]  glimepiride (AMARYL) 2 MG tablet Take 1 tablet (2 mg total) by mouth every morning. 10/15/19 12/14/19  Georgette Shell, MD  isosorbide mononitrate (IMDUR) 60 MG 24 hr tablet Take 60 mg by mouth daily. 02/24/15   [provider]  metoprolol succinate (TOPROL-XL) 100 MG 24 hr tablet Take 100 mg by mouth daily. 06/09/19   [provider]  nitroGLYCERIN (NITROSTAT) 0.4 MG SL tablet Place 0.4 mg under the tongue every 5 (five) minutes as needed for chest pain.    [provider]  Polyethyl Glycol-Propyl Glycol (LUBRICANT EYE DROPS) 0.4-0.3 % SOLN Place 1 drop into both eyes 3 (three) times daily as needed (dry/irritated eyes.). [employer] Advanced Eye Relief Dry Eye Lubricant Eye Drops    [provider]  spironolactone (ALDACTONE) 25 MG tablet Take 25 mg by mouth daily. 08/04/19   [provider]    Allergies    Keflex [cephalexin], Lipitor [atorvastatin], Codeine, Hydrochlorothiazide, Hydrocodone-acetaminophen, Metformin hcl, Tramadol hcl, Azor [amlodipine-olmesartan], Lisinopril, and Penicillins  Review of Systems   Review of Systems  Constitutional: Negative for chills and fever.  HENT: Negative.   Eyes: Negative for visual disturbance.  Respiratory: Positive for shortness of breath. Negative for cough.   Cardiovascular: Positive for chest pain.  Gastrointestinal: Negative for abdominal pain, nausea and vomiting.  Genitourinary: Negative for dysuria and frequency.  Musculoskeletal: Positive for arthralgias and myalgias.  Skin: Negative for color change and rash.  Neurological: Negative for dizziness, weakness, light-headedness and numbness.  All other systems reviewed and are negative.   Physical Exam Updated Vital Signs BP (!) 161/72 (BP Location: Left Arm)   Pulse 65   Temp 98.1 F (36.7 C) (Oral)   Resp (!) 22   Ht '5\' 7"'  (1.702 m)   Wt 74 kg   SpO2 98%   BMI 25.55 kg/m   Physical Exam Vitals and nursing note reviewed.  Constitutional:      General: She is not in acute distress.    Appearance: Normal appearance. She is well-developed  and normal weight. She is not ill-appearing or diaphoretic.  HENT:     Head: Normocephalic and atraumatic.     Mouth/Throat:     Mouth: Mucous membranes are moist.     Pharynx: Oropharynx is clear.  Eyes:     General:        Right eye: No discharge.  Left eye: No discharge.  Cardiovascular:     Rate and Rhythm: Normal rate and regular rhythm.     Pulses: Normal pulses.          Radial pulses are 2+ on the right side and 2+ on the left side.     Heart sounds: Normal heart sounds.  Pulmonary:     Effort: Pulmonary effort is normal. No respiratory distress.     Breath sounds: Normal breath sounds. No wheezing or rales.     Comments: Respirations equal and unlabored, patient able to speak in full sentences, lungs clear to auscultation bilaterally, chest wall NTTP Abdominal:     General: Bowel sounds are normal. There is no distension.     Palpations: Abdomen is soft. There is no mass.     Tenderness: There is no abdominal tenderness. There is no guarding.     Comments: Abdomen soft, nondistended, nontender to palpation in all quadrants without guarding or peritoneal signs   Musculoskeletal:        General: Tenderness present. No deformity.     Cervical back: Neck supple.     Right lower leg: No edema.     Left lower leg: No edema.     Comments: Some tenderness over the shoulder, upper right arm and armpit, without swelling, skin changes, lymphadenopathy.  Range of motion intact, distal pulses 2+, normal sensation and strength.  Skin:    General: Skin is warm and dry.     Capillary Refill: Capillary refill takes less than 2 seconds.  Neurological:     Mental Status: She is alert.     Coordination: Coordination normal.     Comments: Speech is clear, able to follow commands Moves extremities without ataxia, coordination intact  Psychiatric:        Mood and Affect: Mood normal.        Behavior: Behavior normal.     ED Results / Procedures / Treatments   Labs (all labs  ordered are listed, but only abnormal results are displayed) Labs Reviewed  BASIC METABOLIC PANEL - Abnormal; Notable for the following components:      Result Value   Glucose, Bld 251 (*)    Creatinine, Ser 1.22 (*)    GFR, Estimated 45 (*)    All other components within normal limits  D-DIMER, QUANTITATIVE - Abnormal; Notable for the following components:   D-Dimer, Quant 3.65 (*)    All other components within normal limits  CBC WITH DIFFERENTIAL/PLATELET  TROPONIN I (HIGH SENSITIVITY)  TROPONIN I (HIGH SENSITIVITY)    EKG EKG Interpretation  Date/Time:  Monday September 20 2020 07:22:34 EDT Ventricular Rate:  63 PR Interval:  150 QRS Duration: 86 QT Interval:  428 QTC Calculation: 439 R Axis:   69 Text Interpretation: Sinus rhythm Consider left atrial enlargement Borderline T wave abnormalities Baseline wander TECHNICALLY DIFFICULT Otherwise no significant change Confirmed by Deno Etienne [phone]) on 09/20/2020 8:15:44 AM   Radiology DG Chest 2 View  Result Date: 09/20/2020 CLINICAL DATA:  Chest and shoulder pain EXAM: CHEST - 2 VIEW COMPARISON:  04/07/2019 FINDINGS: Heart size normal. No pleural effusion or edema. No airspace densities identified. Coarsened interstitial markings appear similar to previous exam. Visualized osseous structures appear intact. IMPRESSION: No acute cardiopulmonary abnormalities. Electronically Signed   By: Kerby Moors M.D.   On: 09/20/2020 09:05   DG Shoulder Right  Result Date: 09/20/2020 CLINICAL DATA:  80 year old female with right shoulder pain, painful range of motion. EXAM: RIGHT SHOULDER -  2+ VIEW COMPARISON:  Chest radiographs 04/07/2019. FINDINGS: Bone mineralization is within normal limits for age. No glenohumeral joint dislocation. Proximal right humerus intact. Mild to moderate degenerative spurring at both the right [facility] joint and at the right humerus greater tuberosity. Right clavicle, scapula and visible ribs appear intact. Negative visible  right chest. IMPRESSION: Degenerative changes.  No acute osseous abnormality identified. Electronically Signed   By: Genevie Ann M.D.   On: 09/20/2020 09:06   CT Angio Chest PE W and/or Wo Contrast  Result Date: 09/20/2020 CLINICAL DATA:  Chest pain. EXAM: CT ANGIOGRAPHY CHEST WITH CONTRAST TECHNIQUE: Multidetector CT imaging of the chest was performed using the standard protocol during bolus administration of intravenous contrast. Multiplanar CT image reconstructions and MIPs were obtained to evaluate the vascular anatomy. CONTRAST:  48m OMNIPAQUE IOHEXOL 350 MG/ML SOLN COMPARISON:  March 22, 2018. FINDINGS: Cardiovascular: Satisfactory opacification of the pulmonary arteries to the segmental level. No evidence of pulmonary embolism. Normal heart size. No pericardial effusion. Atherosclerosis of thoracic aorta is noted. Mild coronary artery calcifications are noted. Mediastinum/Nodes: No enlarged mediastinal, hilar, or axillary lymph nodes. Thyroid gland, trachea, and esophagus demonstrate no significant findings. Lungs/Pleura: No pneumothorax or pleural effusion is noted. Emphysematous disease is noted in both upper lobes. Stable subpleural nodule is noted posteriorly in the right upper lobe best seen on image number 38 of series 6 which can be considered benign at this point with no further follow-up required. No definite acute abnormality is noted. Upper Abdomen: No acute abnormality. Musculoskeletal: No chest wall abnormality. No acute or significant osseous findings. Review of the MIP images confirms the above findings. IMPRESSION: No definite evidence of pulmonary embolus. Mild coronary artery calcifications are noted. No definite acute abnormality is noted. Aortic Atherosclerosis (ICD10-I70.0). Electronically Signed   By: JMarijo ConceptionM.D.   On: 09/20/2020 15:01     Procedures Procedures   Medications Ordered in ED Medications  iohexol (OMNIPAQUE) 350 MG/ML injection 80 mL (80 mLs Intravenous  Contrast Given 09/20/20 1438)    ED Course  I have reviewed the triage vital signs and the nursing notes.  Pertinent labs & imaging results that were available during my care of the patient were reviewed by me and considered in my medical decision making (see chart for details).    MDM Rules/Calculators/A&P                         Patient presents to the emergency department with right arm pain, dyspnea on exertion. Patient nontoxic appearing, in no apparent distress, vitals without significant abnormality. Fairly benign physical exam.   Patient's primary cardiologist: Dr. HTerrence Dupont DDX: ACS, pulmonary embolism, dissection, pneumothorax, pneumonia, arrhythmia, severe anemia, MSK, GERD, anxiety, abdominal process.   Additional history obtained:  Additional history obtained from chart review & nursing note review.   EKG: Sinus rhythm with baseline wander, no concerning ischemic changes  Lab Tests:  I Ordered, reviewed, and interpreted labs, which included:  CBC: No leukocytosis, normal hemoglobin BMP: Glucose 251, creatinine at baseline, no other electrolyte derangements Troponin: Negative x2 D-dimer: Elevated at 3.65  Imaging Studies ordered:  I ordered imaging studies which included CXR, right shoulder x-ray, CTA, I independently reviewed, formal radiology impression shows:  CXR: No acute cardiopulmonary disease DG right shoulder: Degenerative changes, no acute osseous abnormality CT PE study: No evidence of PE, mild coronary artery calcifications noted, no other acute abnormalities.  ED Course:   EKG without obvious acute  ischemia, delta troponin negative, symptoms very atypical, doubt ACS.  D-dimer was elevated, but CTA without evidence of PE or dissection.  Cardiac monitor reviewed, no notable arrhythmias or tachycardia. Patient has appeared hemodynamically stable throughout ER visit and appears safe for discharge with close PCP/cardiology follow up. I discussed results,  treatment plan, need for PCP follow-up, and return precautions with the patient. Provided opportunity for questions, patient confirmed understanding and is in agreement with plan.    Portions of this note were generated with [employer]. Dictation errors may occur despite best attempts at proofreading.      Final Clinical Impression(s) / ED Diagnoses Final diagnoses:  Right arm pain  Atypical chest pain    Rx / DC Orders ED Discharge Orders    None       Jacqlyn Larsen, Hershal Coria 09/22/20 [place], [place], DO 09/22/20 1927

## 2020-09-20 NOTE — Discharge Instructions (Signed)
Your evaluation today has been reassuring, we do not see signs of stress on your heart or a blood clot contributing to pain and shortness of breath as well as right arm pain.  Please follow-up with your cardiologist and primary care doctor regarding the symptoms, in the meantime you can use topical rubs like Biofreeze or Voltaren, and could continue to take Tylenol for pain.

## 2020-09-20 NOTE — ED Notes (Signed)
Daughter called to pick up pt

## 2020-09-20 NOTE — ED Notes (Signed)
Patient transported to X-ray 

## 2020-09-20 NOTE — ED Notes (Signed)
RN gave pt sandwhich and water

## 2020-10-01 DIAGNOSIS — M2550 Pain in unspecified joint: Secondary | ICD-10-CM | POA: Diagnosis not present

## 2020-10-01 DIAGNOSIS — I701 Atherosclerosis of renal artery: Secondary | ICD-10-CM | POA: Diagnosis not present

## 2020-10-01 DIAGNOSIS — I739 Peripheral vascular disease, unspecified: Secondary | ICD-10-CM | POA: Diagnosis not present

## 2020-10-01 DIAGNOSIS — I25110 Atherosclerotic heart disease of native coronary artery with unstable angina pectoris: Secondary | ICD-10-CM | POA: Diagnosis not present

## 2020-10-01 DIAGNOSIS — I6523 Occlusion and stenosis of bilateral carotid arteries: Secondary | ICD-10-CM | POA: Diagnosis not present

## 2020-10-01 DIAGNOSIS — I639 Cerebral infarction, unspecified: Secondary | ICD-10-CM | POA: Diagnosis not present

## 2020-10-01 DIAGNOSIS — I714 Abdominal aortic aneurysm, without rupture: Secondary | ICD-10-CM | POA: Diagnosis not present

## 2020-10-01 DIAGNOSIS — E1121 Type 2 diabetes mellitus with diabetic nephropathy: Secondary | ICD-10-CM | POA: Diagnosis not present

## 2020-10-21 ENCOUNTER — Other Ambulatory Visit: Payer: Self-pay

## 2020-10-21 DIAGNOSIS — I10 Essential (primary) hypertension: Secondary | ICD-10-CM

## 2020-11-01 DIAGNOSIS — I10 Essential (primary) hypertension: Secondary | ICD-10-CM | POA: Diagnosis not present

## 2020-11-01 DIAGNOSIS — I25118 Atherosclerotic heart disease of native coronary artery with other forms of angina pectoris: Secondary | ICD-10-CM | POA: Diagnosis not present

## 2020-11-01 DIAGNOSIS — I2510 Atherosclerotic heart disease of native coronary artery without angina pectoris: Secondary | ICD-10-CM | POA: Diagnosis not present

## 2020-11-01 DIAGNOSIS — E785 Hyperlipidemia, unspecified: Secondary | ICD-10-CM | POA: Diagnosis not present

## 2020-11-01 DIAGNOSIS — I63311 Cerebral infarction due to thrombosis of right middle cerebral artery: Secondary | ICD-10-CM | POA: Diagnosis not present

## 2020-11-01 DIAGNOSIS — I714 Abdominal aortic aneurysm, without rupture: Secondary | ICD-10-CM | POA: Diagnosis not present

## 2020-11-01 DIAGNOSIS — E1165 Type 2 diabetes mellitus with hyperglycemia: Secondary | ICD-10-CM | POA: Diagnosis not present

## 2020-11-01 DIAGNOSIS — I739 Peripheral vascular disease, unspecified: Secondary | ICD-10-CM | POA: Diagnosis not present

## 2020-11-01 DIAGNOSIS — F17290 Nicotine dependence, other tobacco product, uncomplicated: Secondary | ICD-10-CM | POA: Diagnosis not present

## 2020-11-01 DIAGNOSIS — F322 Major depressive disorder, single episode, severe without psychotic features: Secondary | ICD-10-CM | POA: Diagnosis not present

## 2020-11-01 DIAGNOSIS — J439 Emphysema, unspecified: Secondary | ICD-10-CM | POA: Diagnosis not present

## 2020-11-01 DIAGNOSIS — E119 Type 2 diabetes mellitus without complications: Secondary | ICD-10-CM | POA: Diagnosis not present

## 2020-11-01 DIAGNOSIS — E1121 Type 2 diabetes mellitus with diabetic nephropathy: Secondary | ICD-10-CM | POA: Diagnosis not present

## 2020-11-01 DIAGNOSIS — N182 Chronic kidney disease, stage 2 (mild): Secondary | ICD-10-CM | POA: Diagnosis not present

## 2020-11-04 DIAGNOSIS — F331 Major depressive disorder, recurrent, moderate: Secondary | ICD-10-CM | POA: Diagnosis not present

## 2020-11-04 DIAGNOSIS — I639 Cerebral infarction, unspecified: Secondary | ICD-10-CM | POA: Diagnosis not present

## 2020-11-04 DIAGNOSIS — R109 Unspecified abdominal pain: Secondary | ICD-10-CM | POA: Diagnosis not present

## 2021-01-09 ENCOUNTER — Emergency Department (HOSPITAL_COMMUNITY): Payer: Medicare HMO

## 2021-01-09 ENCOUNTER — Inpatient Hospital Stay (HOSPITAL_COMMUNITY)
Admission: EM | Admit: 2021-01-09 | Discharge: 2021-01-27 | DRG: 233 | Disposition: A | Discharge status: Skilled Nursing Facility | Payer: Medicare HMO | Attending: Thoracic Surgery (Cardiothoracic Vascular Surgery) | Admitting: Thoracic Surgery (Cardiothoracic Vascular Surgery)

## 2021-01-09 ENCOUNTER — Encounter (HOSPITAL_COMMUNITY): Payer: Self-pay

## 2021-01-09 DIAGNOSIS — Z4682 Encounter for fitting and adjustment of non-vascular catheter: Secondary | ICD-10-CM | POA: Diagnosis not present

## 2021-01-09 DIAGNOSIS — D631 Anemia in chronic kidney disease: Secondary | ICD-10-CM | POA: Diagnosis not present

## 2021-01-09 DIAGNOSIS — E119 Type 2 diabetes mellitus without complications: Secondary | ICD-10-CM | POA: Diagnosis not present

## 2021-01-09 DIAGNOSIS — M6281 Muscle weakness (generalized): Secondary | ICD-10-CM | POA: Diagnosis not present

## 2021-01-09 DIAGNOSIS — E1122 Type 2 diabetes mellitus with diabetic chronic kidney disease: Secondary | ICD-10-CM | POA: Diagnosis present

## 2021-01-09 DIAGNOSIS — I472 Ventricular tachycardia: Secondary | ICD-10-CM | POA: Diagnosis not present

## 2021-01-09 DIAGNOSIS — I2510 Atherosclerotic heart disease of native coronary artery without angina pectoris: Secondary | ICD-10-CM

## 2021-01-09 DIAGNOSIS — I63311 Cerebral infarction due to thrombosis of right middle cerebral artery: Secondary | ICD-10-CM | POA: Diagnosis not present

## 2021-01-09 DIAGNOSIS — E785 Hyperlipidemia, unspecified: Secondary | ICD-10-CM | POA: Diagnosis present

## 2021-01-09 DIAGNOSIS — I25110 Atherosclerotic heart disease of native coronary artery with unstable angina pectoris: Secondary | ICD-10-CM | POA: Diagnosis present

## 2021-01-09 DIAGNOSIS — Z951 Presence of aortocoronary bypass graft: Secondary | ICD-10-CM

## 2021-01-09 DIAGNOSIS — J9601 Acute respiratory failure with hypoxia: Secondary | ICD-10-CM | POA: Diagnosis not present

## 2021-01-09 DIAGNOSIS — I69354 Hemiplegia and hemiparesis following cerebral infarction affecting left non-dominant side: Secondary | ICD-10-CM | POA: Diagnosis not present

## 2021-01-09 DIAGNOSIS — Z880 Allergy status to penicillin: Secondary | ICD-10-CM

## 2021-01-09 DIAGNOSIS — I214 Non-ST elevation (NSTEMI) myocardial infarction: Principal | ICD-10-CM

## 2021-01-09 DIAGNOSIS — I499 Cardiac arrhythmia, unspecified: Secondary | ICD-10-CM | POA: Diagnosis not present

## 2021-01-09 DIAGNOSIS — I10 Essential (primary) hypertension: Secondary | ICD-10-CM | POA: Diagnosis present

## 2021-01-09 DIAGNOSIS — N1832 Chronic kidney disease, stage 3b: Secondary | ICD-10-CM | POA: Diagnosis present

## 2021-01-09 DIAGNOSIS — N179 Acute kidney failure, unspecified: Secondary | ICD-10-CM | POA: Diagnosis present

## 2021-01-09 DIAGNOSIS — I25119 Atherosclerotic heart disease of native coronary artery with unspecified angina pectoris: Secondary | ICD-10-CM | POA: Diagnosis not present

## 2021-01-09 DIAGNOSIS — D72829 Elevated white blood cell count, unspecified: Secondary | ICD-10-CM | POA: Diagnosis not present

## 2021-01-09 DIAGNOSIS — E8770 Fluid overload, unspecified: Secondary | ICD-10-CM | POA: Diagnosis not present

## 2021-01-09 DIAGNOSIS — Z83438 Family history of other disorder of lipoprotein metabolism and other lipidemia: Secondary | ICD-10-CM

## 2021-01-09 DIAGNOSIS — Z7401 Bed confinement status: Secondary | ICD-10-CM | POA: Diagnosis not present

## 2021-01-09 DIAGNOSIS — Z66 Do not resuscitate: Secondary | ICD-10-CM | POA: Diagnosis present

## 2021-01-09 DIAGNOSIS — I700 Atherosclerosis of aorta: Secondary | ICD-10-CM | POA: Diagnosis present

## 2021-01-09 DIAGNOSIS — E1151 Type 2 diabetes mellitus with diabetic peripheral angiopathy without gangrene: Secondary | ICD-10-CM | POA: Diagnosis present

## 2021-01-09 DIAGNOSIS — Z885 Allergy status to narcotic agent status: Secondary | ICD-10-CM

## 2021-01-09 DIAGNOSIS — R0902 Hypoxemia: Secondary | ICD-10-CM | POA: Diagnosis not present

## 2021-01-09 DIAGNOSIS — N183 Chronic kidney disease, stage 3 unspecified: Secondary | ICD-10-CM | POA: Diagnosis present

## 2021-01-09 DIAGNOSIS — D696 Thrombocytopenia, unspecified: Secondary | ICD-10-CM | POA: Diagnosis present

## 2021-01-09 DIAGNOSIS — I252 Old myocardial infarction: Secondary | ICD-10-CM

## 2021-01-09 DIAGNOSIS — F17210 Nicotine dependence, cigarettes, uncomplicated: Secondary | ICD-10-CM | POA: Diagnosis present

## 2021-01-09 DIAGNOSIS — J9811 Atelectasis: Secondary | ICD-10-CM | POA: Diagnosis not present

## 2021-01-09 DIAGNOSIS — I471 Supraventricular tachycardia: Secondary | ICD-10-CM | POA: Diagnosis not present

## 2021-01-09 DIAGNOSIS — I714 Abdominal aortic aneurysm, without rupture: Secondary | ICD-10-CM | POA: Diagnosis not present

## 2021-01-09 DIAGNOSIS — R739 Hyperglycemia, unspecified: Secondary | ICD-10-CM | POA: Diagnosis not present

## 2021-01-09 DIAGNOSIS — M7918 Myalgia, other site: Secondary | ICD-10-CM | POA: Diagnosis not present

## 2021-01-09 DIAGNOSIS — Z7982 Long term (current) use of aspirin: Secondary | ICD-10-CM

## 2021-01-09 DIAGNOSIS — R079 Chest pain, unspecified: Secondary | ICD-10-CM | POA: Diagnosis present

## 2021-01-09 DIAGNOSIS — Z20822 Contact with and (suspected) exposure to covid-19: Secondary | ICD-10-CM | POA: Diagnosis present

## 2021-01-09 DIAGNOSIS — Z7984 Long term (current) use of oral hypoglycemic drugs: Secondary | ICD-10-CM

## 2021-01-09 DIAGNOSIS — E1165 Type 2 diabetes mellitus with hyperglycemia: Secondary | ICD-10-CM | POA: Diagnosis present

## 2021-01-09 DIAGNOSIS — M542 Cervicalgia: Secondary | ICD-10-CM | POA: Diagnosis not present

## 2021-01-09 DIAGNOSIS — Z79899 Other long term (current) drug therapy: Secondary | ICD-10-CM

## 2021-01-09 DIAGNOSIS — J9600 Acute respiratory failure, unspecified whether with hypoxia or hypercapnia: Secondary | ICD-10-CM | POA: Diagnosis not present

## 2021-01-09 DIAGNOSIS — Z8249 Family history of ischemic heart disease and other diseases of the circulatory system: Secondary | ICD-10-CM

## 2021-01-09 DIAGNOSIS — K219 Gastro-esophageal reflux disease without esophagitis: Secondary | ICD-10-CM | POA: Diagnosis present

## 2021-01-09 DIAGNOSIS — I480 Paroxysmal atrial fibrillation: Secondary | ICD-10-CM | POA: Diagnosis not present

## 2021-01-09 DIAGNOSIS — D62 Acute posthemorrhagic anemia: Secondary | ICD-10-CM | POA: Diagnosis not present

## 2021-01-09 DIAGNOSIS — R5381 Other malaise: Secondary | ICD-10-CM | POA: Diagnosis not present

## 2021-01-09 DIAGNOSIS — Z7902 Long term (current) use of antithrombotics/antiplatelets: Secondary | ICD-10-CM

## 2021-01-09 DIAGNOSIS — R2681 Unsteadiness on feet: Secondary | ICD-10-CM | POA: Diagnosis not present

## 2021-01-09 DIAGNOSIS — Z96652 Presence of left artificial knee joint: Secondary | ICD-10-CM | POA: Diagnosis present

## 2021-01-09 DIAGNOSIS — I739 Peripheral vascular disease, unspecified: Secondary | ICD-10-CM | POA: Diagnosis not present

## 2021-01-09 DIAGNOSIS — J9690 Respiratory failure, unspecified, unspecified whether with hypoxia or hypercapnia: Secondary | ICD-10-CM | POA: Diagnosis not present

## 2021-01-09 DIAGNOSIS — Z881 Allergy status to other antibiotic agents status: Secondary | ICD-10-CM

## 2021-01-09 DIAGNOSIS — Z888 Allergy status to other drugs, medicaments and biological substances status: Secondary | ICD-10-CM

## 2021-01-09 DIAGNOSIS — Z0189 Encounter for other specified special examinations: Secondary | ICD-10-CM

## 2021-01-09 DIAGNOSIS — R0789 Other chest pain: Secondary | ICD-10-CM | POA: Diagnosis not present

## 2021-01-09 DIAGNOSIS — I517 Cardiomegaly: Secondary | ICD-10-CM | POA: Diagnosis not present

## 2021-01-09 DIAGNOSIS — I129 Hypertensive chronic kidney disease with stage 1 through stage 4 chronic kidney disease, or unspecified chronic kidney disease: Secondary | ICD-10-CM | POA: Diagnosis present

## 2021-01-09 DIAGNOSIS — Z01810 Encounter for preprocedural cardiovascular examination: Secondary | ICD-10-CM

## 2021-01-09 DIAGNOSIS — Z8679 Personal history of other diseases of the circulatory system: Secondary | ICD-10-CM

## 2021-01-09 DIAGNOSIS — J90 Pleural effusion, not elsewhere classified: Secondary | ICD-10-CM

## 2021-01-09 DIAGNOSIS — G9341 Metabolic encephalopathy: Secondary | ICD-10-CM | POA: Diagnosis not present

## 2021-01-09 DIAGNOSIS — J811 Chronic pulmonary edema: Secondary | ICD-10-CM | POA: Diagnosis not present

## 2021-01-09 DIAGNOSIS — Z955 Presence of coronary angioplasty implant and graft: Secondary | ICD-10-CM

## 2021-01-09 DIAGNOSIS — I25118 Atherosclerotic heart disease of native coronary artery with other forms of angina pectoris: Secondary | ICD-10-CM | POA: Diagnosis not present

## 2021-01-09 DIAGNOSIS — Z833 Family history of diabetes mellitus: Secondary | ICD-10-CM

## 2021-01-09 DIAGNOSIS — Z01818 Encounter for other preprocedural examination: Secondary | ICD-10-CM | POA: Diagnosis not present

## 2021-01-09 DIAGNOSIS — Z419 Encounter for procedure for purposes other than remedying health state, unspecified: Secondary | ICD-10-CM

## 2021-01-09 DIAGNOSIS — E1351 Other specified diabetes mellitus with diabetic peripheral angiopathy without gangrene: Secondary | ICD-10-CM | POA: Diagnosis present

## 2021-01-09 DIAGNOSIS — R5383 Other fatigue: Secondary | ICD-10-CM | POA: Diagnosis not present

## 2021-01-09 DIAGNOSIS — Z96642 Presence of left artificial hip joint: Secondary | ICD-10-CM | POA: Diagnosis present

## 2021-01-09 DIAGNOSIS — Z87442 Personal history of urinary calculi: Secondary | ICD-10-CM

## 2021-01-09 DIAGNOSIS — Z09 Encounter for follow-up examination after completed treatment for conditions other than malignant neoplasm: Secondary | ICD-10-CM

## 2021-01-09 DIAGNOSIS — R41841 Cognitive communication deficit: Secondary | ICD-10-CM | POA: Diagnosis not present

## 2021-01-09 DIAGNOSIS — E782 Mixed hyperlipidemia: Secondary | ICD-10-CM | POA: Diagnosis not present

## 2021-01-09 DIAGNOSIS — Z823 Family history of stroke: Secondary | ICD-10-CM

## 2021-01-09 DIAGNOSIS — Z9861 Coronary angioplasty status: Secondary | ICD-10-CM | POA: Diagnosis not present

## 2021-01-09 LAB — BASIC METABOLIC PANEL
Anion gap: 9 (ref 5–15)
BUN: 23 mg/dL (ref 8–23)
CO2: 25 mmol/L (ref 22–32)
Calcium: 9.9 mg/dL (ref 8.9–10.3)
Chloride: 104 mmol/L (ref 98–111)
Creatinine, Ser: 1.54 mg/dL — ABNORMAL HIGH (ref 0.44–1.00)
GFR, Estimated: 34 mL/min — ABNORMAL LOW (ref >60)
Glucose, Bld: 245 mg/dL — ABNORMAL HIGH (ref 70–99)
Potassium: 4.6 mmol/L (ref 3.5–5.1)
Sodium: 138 mmol/L (ref 135–145)

## 2021-01-09 LAB — TROPONIN I (HIGH SENSITIVITY): Troponin I (High Sensitivity): 147 ng/L (ref <18)

## 2021-01-09 LAB — CBC
HCT: 45.1 % (ref 36.0–46.0)
Hemoglobin: 14.9 g/dL (ref 12.0–15.0)
MCH: 29.7 pg (ref 26.0–34.0)
MCHC: 33 g/dL (ref 30.0–36.0)
MCV: 90 fL (ref 80.0–100.0)
Platelets: 188 10*3/uL (ref 150–400)
RBC: 5.01 MIL/uL (ref 3.87–5.11)
RDW: 14.1 % (ref 11.5–15.5)
WBC: 8 10*3/uL (ref 4.0–10.5)
nRBC: 0 % (ref 0.0–0.2)

## 2021-01-09 LAB — CBG MONITORING, ED: Glucose-Capillary: 215 mg/dL — ABNORMAL HIGH (ref 70–99)

## 2021-01-09 NOTE — ED Triage Notes (Signed)
Patient BIBEMS from home c/c chest pain and vomiting beginning today.  Chest pain is described as "pressure of left side of chest" Patient has felt "off" for last two weeks.  EMS Vitals: BP: 168/90 HR: 74 RR:18 SPO2:94% RA 281 Blood glucose EMS: 4 Zofran IV  1 nitro tab

## 2021-01-09 NOTE — ED Notes (Signed)
Patient resting in stretcher comfortably. Denies any needs at this time. Call bell within reach. Stretcher in low and locked position. Side rails up x2.

## 2021-01-09 NOTE — ED Provider Notes (Signed)
[facility] EMERGENCY DEPARTMENT Provider Note   CSN: [id number] Arrival date & time: 01/09/21  2219     History Chief Complaint  Patient presents with   Chest Pain   Emesis    [name] is a 80 y.o. female.   Chest Pain Associated symptoms: no abdominal pain, no back pain, no cough, no fever, no palpitations, no shortness of breath and no vomiting   Emesis Associated symptoms: no abdominal pain, no arthralgias, no chills, no cough, no fever and no sore throat    80 year old female with a past medical history of AAA, diabetes, hypertension, hyperlipidemia, STEMI in 2018 presenting to the emergency department for chest pain.  Patient reports that this afternoon, when she has been walking around her house she has developed chest pain.  It is central, radiating to her back.  It is reliably triggered with exertion every time.  She states that will improve with rest "after a little while".  She denies any fever, cough, nausea, vomiting, diarrhea.  She denies any sick contacts.  She denies any shortness of breath.  She initially called 911 earlier today and they gave her 324 mg of aspirin, but she did not wish to go to the emergency department at that time.  She called 911 again because her chest pain was getting more severe as the evening went on whenever she walked around her home.  EMS report that on their initial twelve-lead, it was relatively unremarkable but when they repeated it there were new ST depressions in the inferior leads.  Past Medical History:  Diagnosis Date   AAA (abdominal aortic aneurysm) ([place])    Aneurysm (HCC)    Arthritis    osteoarthritis   Constipation    Diabetes mellitus    Type 2, on Glimiperide   GERD (gastroesophageal reflux disease)    Glaucoma    Hyperlipidemia    Hypertension    Kidney stones    Myocardial infarction [facility])    Peripheral vascular disease ([place])    Pneumonia    Stroke ([place]) 01/2015   with left side weakness     TIA (transient ischemic attack)    Urinary incontinence     Patient Active Problem List   Diagnosis Date Noted   CVA (cerebral vascular accident) ([place]) 10/11/2019   Hypertensive urgency 04/07/2019   CKD (chronic kidney disease) stage 3, GFR 30-59 ml/min ([place]) 04/07/2019   Chest pain 04/28/2017   History of ST elevation myocardial infarction (STEMI) 04/28/2017   Grief reaction 04/28/2017   Osteoarthritis of left hip 05/18/2015   Status post total replacement of left hip 05/18/2015   Coronary artery disease involving native coronary artery of native heart with angina pectoris ([place]) 04/16/2015   History of stroke 04/16/2015   Type 2 diabetes mellitus with circulatory disorder ([place]) 04/16/2015   Cerebrovascular 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 artery ([place]) 04/16/2015   HLD (hyperlipidemia)    Acute CVA (cerebrovascular accident) ([place]) 02/19/2015   Transient ischemic attack (TIA) 02/18/2015   Stroke [facility])    Cerebral infarction due to thrombosis of right middle cerebral artery (HCC)    Acute myocardial infarction of inferolateral wall ([place]) 04/12/2014   Numbness 03/18/2014   TIA (transient ischemic attack) 03/18/2014   DM (diabetes mellitus) type II uncontrolled, periph vascular disorder ([place]) 03/18/2014   Hyperlipidemia    Hypertension    PVD (peripheral vascular disease) ([place]) 03/05/2014   Transient cerebral ischemia 08/01/2012   Atherosclerosis of native arteries  of extremity with intermittent claudication ([place]) 10/19/2011   Unspecified disorders of arteries and arterioles 10/19/2011   PAD (peripheral artery disease) ([place]) 04/13/2011    Past Surgical History:  Procedure Laterality Date   ABDOMINAL AORTAGRAM N/A 03/13/2014   Procedure: ABDOMINAL Maxcine Ham;  Surgeon: Elam Dutch, MD;  Location: [facility] CATH LAB;  Service: Cardiovascular;  Laterality: N/A;   ABDOMINAL AORTIC ANEURYSM REPAIR     ABDOMINAL AORTIC ANEURYSM REPAIR  ?2012   ABDOMINAL  HYSTERECTOMY     partial   BACK SURGERY     CARDIAC CATHETERIZATION     CORONARY ANGIOPLASTY  03/2015   EYE SURGERY Right    laser surgery for blood behind eye, loss of sight   FEMORAL-POPLITEAL BYPASS GRAFT Left 07/06/2014   Procedure: BYPASS GRAFT FEMORAL-POPLITEAL ARTERY;  Surgeon: Elam Dutch, MD;  Location: [place];  Service: Vascular;  Laterality: Left;   HERNIA REPAIR     JOINT REPLACEMENT     LEFT HEART CATHETERIZATION WITH CORONARY ANGIOGRAM N/A 04/12/2014   Procedure: LEFT HEART CATHETERIZATION WITH CORONARY ANGIOGRAM;  Surgeon: Clent Demark, MD;  Location: [place] CATH LAB;  Service: Cardiovascular;  Laterality: N/A;   PERCUTANEOUS CORONARY STENT INTERVENTION (PCI-S)  04/12/2014   Procedure: PERCUTANEOUS CORONARY STENT INTERVENTION (PCI-S);  Surgeon: Clent Demark, MD;  Location: [facility] CATH LAB;  Service: Cardiovascular;;  prox and mid RCA   PERIPHERAL VASCULAR INTERVENTION  06/25/2019   Procedure: PERIPHERAL VASCULAR INTERVENTION;  Surgeon: Elam Dutch, MD;  Location: [place] CV LAB;  Service: Cardiovascular;;  Lt Renal and Rt Renal   RENAL ANGIOGRAPHY Bilateral 06/25/2019   Procedure: RENAL ANGIOGRAPHY;  Surgeon: Elam Dutch, MD;  Location: [place] CV LAB;  Service: Cardiovascular;  Laterality: Bilateral;   TOTAL HIP ARTHROPLASTY Left 05/18/2015   TOTAL HIP ARTHROPLASTY Left 05/18/2015   Procedure: LEFT TOTAL HIP ARTHROPLASTY ANTERIOR APPROACH;  Surgeon: Mcarthur Rossetti, MD;  Location: [place];  Service: Orthopedics;  Laterality: Left;   TOTAL KNEE ARTHROPLASTY Left ~ 2003   TUBAL LIGATION     uterine tumor     VENTRAL HERNIA REPAIR       OB History   No obstetric history on file.     Family History  Problem Relation Age of Onset   Diabetes Mother    Stroke Mother    Hypertension Father    Stroke Father    Hyperlipidemia Sister    Hypertension Sister    Aneurysm Sister    Hyperlipidemia Brother    Hypertension Brother     Social History    Tobacco Use   Smoking status: Every Day    Packs/day: 0.50    Years: 40.00    Pack years: 20.00    Types: Cigarettes   Smokeless tobacco: Never  Vaping Use   Vaping Use: Never used  Substance Use Topics   Alcohol use: No    Alcohol/week: 0.0 standard drinks   Drug use: No    Home Medications Prior to Admission medications   Medication Sig Start Date End Date Taking? Authorizing Provider  acetaminophen (TYLENOL) 650 MG CR tablet Take 650 mg by mouth at bedtime as needed for pain.     [provider]  albuterol (PROVENTIL HFA;VENTOLIN HFA) 108 (90 Base) MCG/ACT inhaler Inhale 2 puffs into the lungs every 4 (four) hours as needed for wheezing or shortness of breath (or coughing). 12/01/21   Delora Fuel, MD  amLODipine (NORVASC) 5 MG tablet Take 5 mg by mouth  daily. 09/01/19   [provider]  aspirin EC 81 MG tablet Take 1 tablet (81 mg total) by mouth daily. Patient taking differently: Take 243 mg by mouth daily as needed (chest pain).  06/25/19   Elam Dutch, MD  atorvastatin (LIPITOR) 40 MG tablet Take 1 tablet (40 mg total) by mouth daily. 10/15/19 12/14/19  Georgette Shell, MD  blood glucose meter kit and supplies KIT Dispense based on patient and insurance preference. Use up to four times daily as directed. (FOR ICD-9 250.00, 250.01). 10/15/19   Georgette Shell, MD  Blood Glucose Monitoring Suppl (BAYER BREEZE 2 SYSTEM) w/Device KIT 1 each by Does not apply route 2 (two) times daily. 10/15/19   Georgette Shell, MD  busPIRone (BUSPAR) 5 MG tablet Take 1 tablet (5 mg total) by mouth 2 (two) times daily. 10/15/19   Georgette Shell, MD  clopidogrel (PLAVIX) 75 MG tablet Take 75 mg by mouth daily.    [provider]  esomeprazole (NEXIUM) 40 MG capsule Take 40 mg by mouth daily as needed (acid reflux).     [provider]  glimepiride (AMARYL) 2 MG tablet Take 1 tablet (2 mg total) by mouth every morning. 10/15/19 12/14/19  Georgette Shell, MD  isosorbide mononitrate (IMDUR) 60 MG 24 hr tablet Take 60 mg by mouth daily. 02/24/15   [provider]  metoprolol succinate (TOPROL-XL) 100 MG 24 hr tablet Take 100 mg by mouth daily. 06/09/19   [provider]  nitroGLYCERIN (NITROSTAT) 0.4 MG SL tablet Place 0.4 mg under the tongue every 5 (five) minutes as needed for chest pain.    [provider]  Polyethyl Glycol-Propyl Glycol (LUBRICANT EYE DROPS) 0.4-0.3 % SOLN Place 1 drop into both eyes 3 (three) times daily as needed (dry/irritated eyes.). [employer] Advanced Eye Relief Dry Eye Lubricant Eye Drops    [provider]  spironolactone (ALDACTONE) 25 MG tablet Take 25 mg by mouth daily. 08/04/19   [provider]    Allergies    Keflex [cephalexin], Lipitor [atorvastatin], Codeine, Hydrochlorothiazide, Hydrocodone-acetaminophen, Metformin hcl, Tramadol hcl, Azor [amlodipine-olmesartan], Lisinopril, and Penicillins  Review of Systems   Review of Systems  Constitutional:  Negative for chills and fever.  HENT:  Negative for ear pain and sore throat.   Eyes:  Negative for pain and visual disturbance.  Respiratory:  Negative for cough and shortness of breath.   Cardiovascular:  Positive for chest pain. Negative for palpitations.  Gastrointestinal:  Negative for abdominal pain and vomiting.  Genitourinary:  Negative for dysuria and hematuria.  Musculoskeletal:  Negative for arthralgias and back pain.  Skin:  Negative for color change and rash.  Neurological:  Negative for seizures and syncope.  All other systems reviewed and are negative.  Physical Exam Updated Vital Signs BP (!) 157/93   Pulse 68   Temp 97.9 F (36.6 C) (Oral)   Resp 16   SpO2 95%   Physical Exam Vitals and nursing note reviewed.  Constitutional:      General: She is not in acute distress.    Appearance: She is well-developed. She is not ill-appearing or toxic-appearing.  HENT:     Head:  Normocephalic and atraumatic.  Eyes:     Conjunctiva/sclera: Conjunctivae normal.  Cardiovascular:     Rate and Rhythm: Normal rate and regular rhythm.     Heart sounds: No murmur heard. Pulmonary:     Effort: Pulmonary effort is normal. No respiratory distress.  Breath sounds: Normal breath sounds.  Abdominal:     Palpations: Abdomen is soft.     Tenderness: There is no abdominal tenderness.  Musculoskeletal:     Cervical back: Neck supple.     Right lower leg: No edema.     Left lower leg: Edema present.     Comments: Asymmetric leg swelling that is baseline per patient, from previous leg surgery  Skin:    General: Skin is warm and dry.     Capillary Refill: Capillary refill takes less than 2 seconds.  Neurological:     Mental Status: She is alert.    ED Results / Procedures / Treatments   Labs (all labs ordered are listed, but only abnormal results are displayed) Labs Reviewed  CBG MONITORING, ED - Abnormal; Notable for the following components:      Result Value   Glucose-Capillary 215 (*)    All other components within normal limits  RESP PANEL BY RT-PCR (FLU A&B, COVID) ARPGX2  CBC  BASIC METABOLIC PANEL  TROPONIN I (HIGH SENSITIVITY)    EKG EKG Interpretation  Date/Time:  'Sunday January 09 2021 22:34:51 EDT Ventricular Rate:  65 PR Interval:  156 QRS Duration: 94 QT Interval:  428 QTC Calculation: 445 R Axis:   63 Text Interpretation: Sinus rhythm Consider left ventricular hypertrophy Nonspecific T abnormalities, lateral leads No significant change since last tracing Confirmed by Knapp, Jon ([id number]) on 01/09/2021 10:37:09 PM  Radiology DG Chest Portable 1 View  Result Date: 01/09/2021 CLINICAL DATA:  Chest pain EXAM: PORTABLE CHEST 1 VIEW COMPARISON:  09/20/2020 FINDINGS: Cardiac shadow is stable. The lungs are well aerated bilaterally. No focal infiltrate or effusion is seen. No bony abnormality is noted. IMPRESSION: No active disease. Electronically  Signed   By: Mark  Lukens M.D.   On: 01/09/2021 22:59    Procedures Procedures   Medications Ordered in ED Medications - No data to display  ED Course  I have reviewed the triage vital signs and the nursing notes.  Pertinent labs & imaging results that were available during my care of the patient were reviewed by me and considered in my medical decision making (see chart for details).    MDM Rules/Calculators/A&P                           80'  year old female with known CAD presenting to the emergency department with typical exertional chest pain.  Vital signs reviewed on arrival, within acceptable limits.  Patient has already been given 324 mg of aspirin earlier by EMS.  EMS captured 2 twelve-lead ECGs, 1 when she was experiencing chest pain that seems to show some ST depressions in leads II, III, and aVF and then another when she was pain-free that does not show these depressions.  Concerning for dynamic changes.  Patient is currently changed free, therefore will defer heparin initiation.  We will obtain a CBC, BMP, delta troponin, chest x-ray.  EKG obtained here on arrival shows normal sinus rhythm, appropriate QTC, no anatomic ST elevations or ST depressions that be concerning for acute ischemia.  Chest x-ray does not show any evidence of pneumonia or pneumothorax.  Although the patient has some asymmetric leg swelling, she states that this is baseline and her presentation is not consistent with pulmonary embolism or aortic dissection. ACS work up pending. Patient care signed out to oncoming provider.   Final Clinical Impression(s) / ED Diagnoses Final diagnoses:  Exertional chest pain  Rx / DC Orders ED Discharge Orders     None        Claud Kelp, MD 01/09/21 2340    Dorie Rank, MD 01/10/21 [id number]

## 2021-01-10 ENCOUNTER — Other Ambulatory Visit: Payer: Self-pay

## 2021-01-10 ENCOUNTER — Encounter (HOSPITAL_COMMUNITY): Payer: Self-pay | Admitting: Family Medicine

## 2021-01-10 ENCOUNTER — Inpatient Hospital Stay (HOSPITAL_COMMUNITY): Payer: Medicare HMO

## 2021-01-10 DIAGNOSIS — E1122 Type 2 diabetes mellitus with diabetic chronic kidney disease: Secondary | ICD-10-CM | POA: Diagnosis present

## 2021-01-10 DIAGNOSIS — Z20822 Contact with and (suspected) exposure to covid-19: Secondary | ICD-10-CM | POA: Diagnosis present

## 2021-01-10 DIAGNOSIS — I471 Supraventricular tachycardia: Secondary | ICD-10-CM | POA: Diagnosis not present

## 2021-01-10 DIAGNOSIS — N1832 Chronic kidney disease, stage 3b: Secondary | ICD-10-CM | POA: Diagnosis present

## 2021-01-10 DIAGNOSIS — J9601 Acute respiratory failure with hypoxia: Secondary | ICD-10-CM | POA: Diagnosis not present

## 2021-01-10 DIAGNOSIS — I10 Essential (primary) hypertension: Secondary | ICD-10-CM | POA: Diagnosis not present

## 2021-01-10 DIAGNOSIS — D62 Acute posthemorrhagic anemia: Secondary | ICD-10-CM | POA: Diagnosis not present

## 2021-01-10 DIAGNOSIS — Z01810 Encounter for preprocedural cardiovascular examination: Secondary | ICD-10-CM | POA: Diagnosis not present

## 2021-01-10 DIAGNOSIS — I739 Peripheral vascular disease, unspecified: Secondary | ICD-10-CM | POA: Diagnosis not present

## 2021-01-10 DIAGNOSIS — I214 Non-ST elevation (NSTEMI) myocardial infarction: Secondary | ICD-10-CM | POA: Diagnosis not present

## 2021-01-10 DIAGNOSIS — F17210 Nicotine dependence, cigarettes, uncomplicated: Secondary | ICD-10-CM | POA: Diagnosis present

## 2021-01-10 DIAGNOSIS — J9811 Atelectasis: Secondary | ICD-10-CM | POA: Diagnosis not present

## 2021-01-10 DIAGNOSIS — D696 Thrombocytopenia, unspecified: Secondary | ICD-10-CM | POA: Diagnosis present

## 2021-01-10 DIAGNOSIS — I700 Atherosclerosis of aorta: Secondary | ICD-10-CM | POA: Diagnosis present

## 2021-01-10 DIAGNOSIS — N183 Chronic kidney disease, stage 3 unspecified: Secondary | ICD-10-CM | POA: Diagnosis not present

## 2021-01-10 DIAGNOSIS — E785 Hyperlipidemia, unspecified: Secondary | ICD-10-CM | POA: Diagnosis present

## 2021-01-10 DIAGNOSIS — R079 Chest pain, unspecified: Secondary | ICD-10-CM | POA: Diagnosis present

## 2021-01-10 DIAGNOSIS — E1151 Type 2 diabetes mellitus with diabetic peripheral angiopathy without gangrene: Secondary | ICD-10-CM | POA: Diagnosis not present

## 2021-01-10 DIAGNOSIS — I472 Ventricular tachycardia: Secondary | ICD-10-CM | POA: Diagnosis not present

## 2021-01-10 DIAGNOSIS — J90 Pleural effusion, not elsewhere classified: Secondary | ICD-10-CM | POA: Diagnosis present

## 2021-01-10 DIAGNOSIS — D72829 Elevated white blood cell count, unspecified: Secondary | ICD-10-CM | POA: Diagnosis not present

## 2021-01-10 DIAGNOSIS — I69354 Hemiplegia and hemiparesis following cerebral infarction affecting left non-dominant side: Secondary | ICD-10-CM | POA: Diagnosis not present

## 2021-01-10 DIAGNOSIS — Z66 Do not resuscitate: Secondary | ICD-10-CM | POA: Diagnosis present

## 2021-01-10 DIAGNOSIS — I25110 Atherosclerotic heart disease of native coronary artery with unstable angina pectoris: Secondary | ICD-10-CM | POA: Diagnosis not present

## 2021-01-10 DIAGNOSIS — N179 Acute kidney failure, unspecified: Secondary | ICD-10-CM | POA: Diagnosis present

## 2021-01-10 DIAGNOSIS — E1165 Type 2 diabetes mellitus with hyperglycemia: Secondary | ICD-10-CM | POA: Diagnosis present

## 2021-01-10 DIAGNOSIS — I129 Hypertensive chronic kidney disease with stage 1 through stage 4 chronic kidney disease, or unspecified chronic kidney disease: Secondary | ICD-10-CM | POA: Diagnosis present

## 2021-01-10 DIAGNOSIS — I2510 Atherosclerotic heart disease of native coronary artery without angina pectoris: Secondary | ICD-10-CM

## 2021-01-10 DIAGNOSIS — G9341 Metabolic encephalopathy: Secondary | ICD-10-CM | POA: Diagnosis not present

## 2021-01-10 DIAGNOSIS — E8770 Fluid overload, unspecified: Secondary | ICD-10-CM | POA: Diagnosis not present

## 2021-01-10 LAB — BASIC METABOLIC PANEL
Anion gap: 8 (ref 5–15)
BUN: 23 mg/dL (ref 8–23)
CO2: 23 mmol/L (ref 22–32)
Calcium: 9.1 mg/dL (ref 8.9–10.3)
Chloride: 106 mmol/L (ref 98–111)
Creatinine, Ser: 1.36 mg/dL — ABNORMAL HIGH (ref 0.44–1.00)
GFR, Estimated: 39 mL/min — ABNORMAL LOW (ref >60)
Glucose, Bld: 171 mg/dL — ABNORMAL HIGH (ref 70–99)
Potassium: 4.2 mmol/L (ref 3.5–5.1)
Sodium: 137 mmol/L (ref 135–145)

## 2021-01-10 LAB — TROPONIN I (HIGH SENSITIVITY)
Troponin I (High Sensitivity): 1018 ng/L (ref <18)
Troponin I (High Sensitivity): 6501 ng/L (ref <18)
Troponin I (High Sensitivity): 6571 ng/L (ref <18)
Troponin I (High Sensitivity): 5766 ng/L (ref <18)

## 2021-01-10 LAB — HEMOGLOBIN A1C
Hgb A1c MFr Bld: 9.7 % — ABNORMAL HIGH (ref 4.8–5.6)
Mean Plasma Glucose: 231.69 mg/dL

## 2021-01-10 LAB — CBC
HCT: 42.5 % (ref 36.0–46.0)
Hemoglobin: 13.7 g/dL (ref 12.0–15.0)
MCH: 29.1 pg (ref 26.0–34.0)
MCHC: 32.2 g/dL (ref 30.0–36.0)
MCV: 90.4 fL (ref 80.0–100.0)
Platelets: 235 10*3/uL (ref 150–400)
RBC: 4.7 MIL/uL (ref 3.87–5.11)
RDW: 14.6 % (ref 11.5–15.5)
WBC: 7.9 10*3/uL (ref 4.0–10.5)
nRBC: 0 % (ref 0.0–0.2)

## 2021-01-10 LAB — GLUCOSE, CAPILLARY: Glucose-Capillary: 217 mg/dL — ABNORMAL HIGH (ref 70–99)

## 2021-01-10 LAB — RESP PANEL BY RT-PCR (FLU A&B, COVID) ARPGX2
Influenza A by PCR: NEGATIVE
Influenza B by PCR: NEGATIVE
SARS Coronavirus 2 by RT PCR: NEGATIVE

## 2021-01-10 LAB — ECHOCARDIOGRAM COMPLETE
Area-P 1/2: 3.7 cm2
Height: 67 in
S' Lateral: 2.9 cm
Weight: 2610.25 oz

## 2021-01-10 LAB — CBG MONITORING, ED
Glucose-Capillary: 128 mg/dL — ABNORMAL HIGH (ref 70–99)
Glucose-Capillary: 222 mg/dL — ABNORMAL HIGH (ref 70–99)
Glucose-Capillary: 158 mg/dL — ABNORMAL HIGH (ref 70–99)

## 2021-01-10 LAB — LIPID PANEL
Cholesterol: 247 mg/dL — ABNORMAL HIGH (ref 0–200)
HDL: 39 mg/dL — ABNORMAL LOW (ref >40)
LDL Cholesterol: 180 mg/dL — ABNORMAL HIGH (ref 0–99)
Total CHOL/HDL Ratio: 6.3 RATIO
Triglycerides: 139 mg/dL (ref <150)
VLDL: 28 mg/dL (ref 0–40)

## 2021-01-10 LAB — HEPARIN LEVEL (UNFRACTIONATED)
Heparin Unfractionated: 0.63 IU/mL (ref 0.30–0.70)
Heparin Unfractionated: 0.47 IU/mL (ref 0.30–0.70)

## 2021-01-10 MED ORDER — SODIUM CHLORIDE 0.9 % WEIGHT BASED INFUSION
1.0000 mL/kg/h | INTRAVENOUS | Status: DC
  Administered 2021-01-11: 1 mL/kg/h via INTRAVENOUS

## 2021-01-10 MED ORDER — INSULIN ASPART 100 UNIT/ML IJ SOLN
0.0000 [IU] | Freq: Every day | INTRAMUSCULAR | Status: DC
  Administered 2021-01-10 – 2021-01-16 (×2): 2 [IU] via SUBCUTANEOUS

## 2021-01-10 MED ORDER — NITROGLYCERIN IN D5W 200-5 MCG/ML-% IV SOLN
0.0000 ug/min | INTRAVENOUS | Status: DC
  Administered 2021-01-10: 5 ug/min via INTRAVENOUS
  Filled 2021-01-10: qty 250

## 2021-01-10 MED ORDER — PANTOPRAZOLE SODIUM 40 MG IV SOLR
40.0000 mg | INTRAVENOUS | Status: DC
  Administered 2021-01-10 – 2021-01-12 (×3): 40 mg via INTRAVENOUS
  Filled 2021-01-10 (×4): qty 40

## 2021-01-10 MED ORDER — MORPHINE SULFATE (PF) 2 MG/ML IV SOLN
2.0000 mg | Freq: Once | INTRAVENOUS | Status: AC
  Administered 2021-01-10: 2 mg via INTRAVENOUS
  Filled 2021-01-10: qty 1

## 2021-01-10 MED ORDER — NITROGLYCERIN 2 % TD OINT
1.0000 [in_us] | TOPICAL_OINTMENT | Freq: Once | TRANSDERMAL | Status: AC
  Administered 2021-01-10: 1 [in_us] via TOPICAL
  Filled 2021-01-10: qty 1

## 2021-01-10 MED ORDER — ACETAMINOPHEN 650 MG RE SUPP: 650.0000 mg | Freq: Four times a day (QID) | RECTAL | Status: DC | PRN

## 2021-01-10 MED ORDER — HYDROMORPHONE HCL 1 MG/ML IJ SOLN: 1.0000 mg | INTRAMUSCULAR | Status: DC | PRN

## 2021-01-10 MED ORDER — SPIRONOLACTONE 25 MG PO TABS
25.0000 mg | ORAL_TABLET | Freq: Every day | ORAL | Status: DC
  Filled 2021-01-10: qty 1

## 2021-01-10 MED ORDER — NITROGLYCERIN 0.4 MG SL SUBL
0.4000 mg | SUBLINGUAL_TABLET | SUBLINGUAL | Status: DC | PRN
  Administered 2021-01-10 – 2021-01-12 (×3): 0.4 mg via SUBLINGUAL
  Filled 2021-01-10 (×3): qty 1

## 2021-01-10 MED ORDER — NITROGLYCERIN 2 % TD OINT
1.0000 [in_us] | TOPICAL_OINTMENT | Freq: Four times a day (QID) | TRANSDERMAL | Status: DC
  Administered 2021-01-10: 1 [in_us] via TOPICAL
  Filled 2021-01-10: qty 1

## 2021-01-10 MED ORDER — ISOSORBIDE MONONITRATE ER 30 MG PO TB24
60.0000 mg | ORAL_TABLET | Freq: Every day | ORAL | Status: DC
  Administered 2021-01-10: 60 mg via ORAL
  Filled 2021-01-10: qty 2

## 2021-01-10 MED ORDER — SODIUM CHLORIDE 0.9 % IV SOLN
250.0000 mL | INTRAVENOUS | Status: DC | PRN
Start: 2021-01-10 — End: 2021-01-11

## 2021-01-10 MED ORDER — HEPARIN (PORCINE) 25000 UT/250ML-% IV SOLN
750.0000 [IU]/h | INTRAVENOUS | Status: DC
  Administered 2021-01-10: 800 [IU]/h via INTRAVENOUS
  Administered 2021-01-11: 750 [IU]/h via INTRAVENOUS
  Filled 2021-01-10 (×2): qty 250

## 2021-01-10 MED ORDER — METOPROLOL SUCCINATE ER 25 MG PO TB24
100.0000 mg | ORAL_TABLET | Freq: Every day | ORAL | Status: DC
  Administered 2021-01-10: 100 mg via ORAL
  Filled 2021-01-10: qty 4

## 2021-01-10 MED ORDER — LACTATED RINGERS IV SOLN: INTRAVENOUS | Status: DC

## 2021-01-10 MED ORDER — HEPARIN BOLUS VIA INFUSION
3000.0000 [IU] | Freq: Once | INTRAVENOUS | Status: AC
  Administered 2021-01-10: 3000 [IU] via INTRAVENOUS
  Filled 2021-01-10: qty 3000

## 2021-01-10 MED ORDER — METOPROLOL TARTRATE 25 MG PO TABS
25.0000 mg | ORAL_TABLET | Freq: Two times a day (BID) | ORAL | Status: DC
  Administered 2021-01-10 – 2021-01-16 (×14): 25 mg via ORAL
  Filled 2021-01-10 (×15): qty 1

## 2021-01-10 MED ORDER — ALBUTEROL SULFATE (2.5 MG/3ML) 0.083% IN NEBU: 2.5000 mg | INHALATION_SOLUTION | RESPIRATORY_TRACT | Status: DC | PRN

## 2021-01-10 MED ORDER — ASPIRIN EC 81 MG PO TBEC
81.0000 mg | DELAYED_RELEASE_TABLET | Freq: Every day | ORAL | Status: DC
  Administered 2021-01-10 – 2021-01-16 (×6): 81 mg via ORAL
  Filled 2021-01-10 (×7): qty 1

## 2021-01-10 MED ORDER — CLOPIDOGREL BISULFATE 75 MG PO TABS
300.0000 mg | ORAL_TABLET | Freq: Once | ORAL | Status: AC
  Administered 2021-01-10: 300 mg via ORAL
  Filled 2021-01-10: qty 4

## 2021-01-10 MED ORDER — SODIUM CHLORIDE 0.9 % WEIGHT BASED INFUSION
3.0000 mL/kg/h | INTRAVENOUS | Status: AC
  Administered 2021-01-11: 3 mL/kg/h via INTRAVENOUS

## 2021-01-10 MED ORDER — SODIUM CHLORIDE 0.9% FLUSH: 3.0000 mL | INTRAVENOUS | Status: DC | PRN

## 2021-01-10 MED ORDER — NICOTINE 14 MG/24HR TD PT24
14.0000 mg | MEDICATED_PATCH | Freq: Every day | TRANSDERMAL | Status: DC
  Administered 2021-01-10 – 2021-01-16 (×7): 14 mg via TRANSDERMAL
  Filled 2021-01-10 (×9): qty 1

## 2021-01-10 MED ORDER — AMLODIPINE BESYLATE 5 MG PO TABS
5.0000 mg | ORAL_TABLET | Freq: Every day | ORAL | Status: DC
  Administered 2021-01-10 – 2021-01-16 (×7): 5 mg via ORAL
  Filled 2021-01-10 (×8): qty 1

## 2021-01-10 MED ORDER — INSULIN ASPART 100 UNIT/ML IJ SOLN
0.0000 [IU] | Freq: Three times a day (TID) | INTRAMUSCULAR | Status: DC
  Administered 2021-01-10: 1 [IU] via SUBCUTANEOUS
  Administered 2021-01-10: 3 [IU] via SUBCUTANEOUS
  Administered 2021-01-10: 2 [IU] via SUBCUTANEOUS
  Administered 2021-01-11 – 2021-01-12 (×4): 1 [IU] via SUBCUTANEOUS
  Administered 2021-01-13 – 2021-01-14 (×4): 2 [IU] via SUBCUTANEOUS
  Administered 2021-01-14: 1 [IU] via SUBCUTANEOUS
  Administered 2021-01-15 – 2021-01-16 (×5): 2 [IU] via SUBCUTANEOUS
  Administered 2021-01-16: 1 [IU] via SUBCUTANEOUS
  Administered 2021-01-17: 2 [IU] via SUBCUTANEOUS

## 2021-01-10 MED ORDER — ATORVASTATIN CALCIUM 80 MG PO TABS
80.0000 mg | ORAL_TABLET | Freq: Every day | ORAL | Status: DC
  Administered 2021-01-10 – 2021-01-16 (×7): 80 mg via ORAL
  Filled 2021-01-10 (×5): qty 1
  Filled 2021-01-10: qty 2
  Filled 2021-01-10 (×2): qty 1

## 2021-01-10 MED ORDER — ACETAMINOPHEN 325 MG PO TABS
650.0000 mg | ORAL_TABLET | Freq: Four times a day (QID) | ORAL | Status: DC | PRN
Start: 2021-01-10 — End: 2021-01-17
  Administered 2021-01-10 – 2021-01-16 (×9): 650 mg via ORAL
  Filled 2021-01-10 (×9): qty 2

## 2021-01-10 MED ORDER — ASPIRIN 81 MG PO CHEW
81.0000 mg | CHEWABLE_TABLET | ORAL | Status: AC
  Administered 2021-01-11: 81 mg via ORAL
  Filled 2021-01-10: qty 1

## 2021-01-10 MED ORDER — SODIUM CHLORIDE 0.9% FLUSH
3.0000 mL | Freq: Two times a day (BID) | INTRAVENOUS | Status: DC
  Administered 2021-01-10 – 2021-01-15 (×3): 3 mL via INTRAVENOUS

## 2021-01-10 MED ORDER — CLOPIDOGREL BISULFATE 75 MG PO TABS
75.0000 mg | ORAL_TABLET | Freq: Every day | ORAL | Status: DC
  Administered 2021-01-10 – 2021-01-11 (×2): 75 mg via ORAL
  Filled 2021-01-10 (×3): qty 1

## 2021-01-10 NOTE — Consult Note (Signed)
Reason for Consult:chest pain/elevated high sensitivity troponin I Referring Physician: [facility]  [name] is an 80 y.o. female.  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 presented as STEMI requiring PTCA stenting to 100% and RCA using 2.7530 mm long and 2.7512 mm long science Alpine drug-eluting stent, noted to have moderate ostial LAD stenosis, hypertension, hyperlipidemia, diabetes mellitus, abdominal aortic aneurysm repair in the past, history of renal artery stenosis status post stenting in the past,history of femoropopliteal bypass in the past,history of CVA , tobacco abuse, actively smokes half pack per day, Imdur ER complaining ofretrosternal and left-sided chest pain described as pressure that radiated toward 10 with minimal exertion radiating to neck and both shoulders.  States took 2 sublingual nitroglycerin on Saturday with relief and yesterday again took 2 sublingual nitroglycerin with partial relief.  The associated with nausea and vomiting.  States chest pain occurs with minimal exertion with walking at home.  Denies any palpitation, lightheadedness or syncope.  Denies PND, orthopnea or leg swelling.  Patient had EKG done in the ED, which showed normal sinus rhythm with nonspecific minor ST-T wave changes.  First set of troponin high was minimally elevated.  Patient was started on heparin and received Nitropaste with relief of chest pain.  Patient presently denies any chest pain.states has not been very compliant with her medications and has missed aspirin.  Past Medical History:  Diagnosis Date   AAA (abdominal aortic aneurysm) ([place])    Aneurysm (HCC)    Arthritis    osteoarthritis   Constipation    Diabetes mellitus    Type 2, on Glimiperide   GERD (gastroesophageal reflux disease)    Glaucoma    Hyperlipidemia    Hypertension     Kidney stones    Myocardial infarction [facility])    Peripheral vascular disease ([place])    Pneumonia    Stroke ([place]) 01/2015   with left side weakness    TIA (transient ischemic attack)    Urinary incontinence     Past Surgical History:  Procedure Laterality Date   ABDOMINAL AORTAGRAM N/A 03/13/2014   Procedure: ABDOMINAL Maxcine Ham;  Surgeon: Elam Dutch, MD;  Location: [facility] CATH LAB;  Service: Cardiovascular;  Laterality: N/A;   ABDOMINAL AORTIC ANEURYSM REPAIR     ABDOMINAL AORTIC ANEURYSM REPAIR  ?2012   ABDOMINAL HYSTERECTOMY     partial   BACK SURGERY     CARDIAC CATHETERIZATION     CORONARY ANGIOPLASTY  03/2015   EYE SURGERY Right    laser surgery for blood behind eye, loss of sight   FEMORAL-POPLITEAL BYPASS GRAFT Left 07/06/2014   Procedure: BYPASS GRAFT FEMORAL-POPLITEAL ARTERY;  Surgeon: Elam Dutch, MD;  Location: [facility];  Service: Vascular;  Laterality: Left;   HERNIA REPAIR     JOINT REPLACEMENT     LEFT HEART CATHETERIZATION WITH CORONARY ANGIOGRAM N/A 04/12/2014   Procedure: LEFT HEART CATHETERIZATION WITH CORONARY ANGIOGRAM;  Surgeon: Clent Demark, MD;  Location: [facility] CATH LAB;  Service: Cardiovascular;  Laterality: N/A;   PERCUTANEOUS CORONARY STENT INTERVENTION (PCI-S)  04/12/2014   Procedure: PERCUTANEOUS CORONARY STENT INTERVENTION (PCI-S);  Surgeon: Clent Demark, MD;  Location: [facility] CATH LAB;  Service: Cardiovascular;;  prox and mid RCA   PERIPHERAL VASCULAR INTERVENTION  06/25/2019   Procedure: PERIPHERAL VASCULAR INTERVENTION;  Surgeon: Elam Dutch, MD;  Location: [facility] CV LAB;  Service: Cardiovascular;;  Lt Renal and Rt Renal   RENAL ANGIOGRAPHY Bilateral 06/25/2019   Procedure: RENAL ANGIOGRAPHY;  Surgeon: Elam Dutch, MD;  Location: [facility] CV LAB;  Service: Cardiovascular;  Laterality: Bilateral;   TOTAL HIP ARTHROPLASTY Left 05/18/2015   TOTAL HIP ARTHROPLASTY Left 05/18/2015   Procedure: LEFT TOTAL HIP ARTHROPLASTY ANTERIOR  APPROACH;  Surgeon: Mcarthur Rossetti, MD;  Location: [facility];  Service: Orthopedics;  Laterality: Left;   TOTAL KNEE ARTHROPLASTY Left ~ 2003   TUBAL LIGATION     uterine tumor     VENTRAL HERNIA REPAIR      Family History  Problem Relation Age of Onset   Diabetes Mother    Stroke Mother    Hypertension Father    Stroke Father    Hyperlipidemia Sister    Hypertension Sister    Aneurysm Sister    Hyperlipidemia Brother    Hypertension Brother     Social History:  reports that she has been smoking cigarettes. She has a 20.00 pack-year smoking history. She has never used smokeless tobacco. She reports that she does not drink alcohol and does not use drugs.  Allergies:  Allergies  Allergen Reactions   Keflex [Cephalexin] Shortness Of Breath and Other (See Comments)    Chest pain, headache   Lipitor [Atorvastatin] Shortness Of Breath and Other (See Comments)    Severe pain/cramping in legs- "I cannot walk, talk, or breathe" (pt is currently taking 20 mg daily 10/11/19)   Codeine Other (See Comments)    Disrupted patient's equilibrium- "Made my world flip upside down"   Hydrochlorothiazide Other (See Comments)    Other reaction(s): lightheaded, nausea   Hydrocodone-Acetaminophen Other (See Comments)    Other reaction(s): "get crazy"   Metformin Hcl Other (See Comments)    Other reaction(s): upset stomach   Tramadol Hcl Other (See Comments)    Other reaction(s): "get crazy"   Azor [Amlodipine-Olmesartan] Swelling, Palpitations and Rash    Amlodipine alone resumed 12/2013, tolerating   Lisinopril Cough   Penicillins Dermatitis and Other (See Comments)    Did it involve swelling of the face/tongue/throat, SOB, or low BP? No, just worsened eczema Did it involve sudden or severe rash/hives, skin peeling, or any reaction on the inside of your mouth or nose? No Did you need to seek medical attention at a hospital or doctor's office? No When did it last happen? "More than 10 years  ago" If all above answers are "NO", may proceed with cephalosporin use.     Medications: I have reviewed the patient's current medications.  Results for orders placed or performed during the hospital encounter of 01/09/21 (from the past 48 hour(s))  Resp Panel by RT-PCR (Flu A&B, Covid) Nasopharyngeal Swab     Status: None   Collection Time: 01/09/21 12:18 AM   Specimen: Nasopharyngeal Swab; Nasopharyngeal(NP) swabs in vial transport medium  Result Value Ref Range   SARS Coronavirus 2 by RT PCR NEGATIVE NEGATIVE    Comment: (NOTE) SARS-CoV-2 target nucleic acids are NOT DETECTED.  The SARS-CoV-2 RNA is generally detectable in upper respiratory specimens during the acute phase of infection. The lowest concentration of SARS-CoV-2 viral copies this assay can detect is 138 copies/mL. A negative result does not preclude SARS-Cov-2 infection and should not be used as the sole basis for treatment or other patient management decisions. A negative result may occur with  improper specimen collection/handling, submission of specimen other than nasopharyngeal swab, presence of viral mutation(s) within the areas targeted by this  assay, and inadequate number of viral copies(<138 copies/mL). A negative result must be combined with clinical observations, patient history, and epidemiological information. The expected result is Negative.  Fact Sheet for Patients:  EntrepreneurPulse.com.au  Fact Sheet for Healthcare Providers:  IncredibleEmployment.be  This test is no t yet approved or cleared by the [place] FDA and  has been authorized for detection and/or diagnosis of SARS-CoV-2 by FDA under an Emergency Use Authorization (EUA). This EUA will remain  in effect (meaning this test can be used) for the duration of the COVID-19 declaration under Section 564(b)(1) of the Act, 21 U.S.C.section 360bbb-3(b)(1), unless the authorization is terminated  or  revoked sooner.       Influenza A by PCR NEGATIVE NEGATIVE   Influenza B by PCR NEGATIVE NEGATIVE    Comment: (NOTE) The Xpert Xpress SARS-CoV-2/FLU/RSV plus assay is intended as an aid in the diagnosis of influenza from Nasopharyngeal swab specimens and should not be used as a sole basis for treatment. Nasal washings and aspirates are unacceptable for Xpert Xpress SARS-CoV-2/FLU/RSV testing.  Fact Sheet for Patients: EntrepreneurPulse.com.au  Fact Sheet for Healthcare Providers: IncredibleEmployment.be  This test is not yet approved or cleared by the [place] FDA and has been authorized for detection and/or diagnosis of SARS-CoV-2 by FDA under an Emergency Use Authorization (EUA). This EUA will remain in effect (meaning this test can be used) for the duration of the COVID-19 declaration under Section 564(b)(1) of the Act, 21 U.S.C. section 360bbb-3(b)(1), unless the authorization is terminated or revoked.  Performed at [facility], [place] [address] [place], [place] [address]   Basic metabolic panel     Status: Abnormal   Collection Time: 01/09/21 10:30 PM  Result Value Ref Range   Sodium 138 135 - 145 mmol/L   Potassium 4.6 3.5 - 5.1 mmol/L   Chloride 104 98 - 111 mmol/L   CO2 25 22 - 32 mmol/L   Glucose, Bld 245 (H) 70 - 99 mg/dL    Comment: Glucose reference range applies only to samples taken after fasting for at least 8 hours.   BUN 23 8 - 23 mg/dL   Creatinine, Ser 1.54 (H) 0.44 - 1.00 mg/dL   Calcium 9.9 8.9 - 10.3 mg/dL   GFR, Estimated 34 (L) >60 mL/min    Comment: (NOTE) Calculated using the CKD-EPI Creatinine Equation (2021)    Anion gap 9 5 - 15    Comment: Performed at [facility] [address] [place], [place] [address]  Troponin I (High Sensitivity)     Status: Abnormal   Collection Time: 01/09/21 10:30 PM  Result Value Ref Range   Troponin I (High Sensitivity) 147 (HH) <18 ng/L    Comment:  CRITICAL RESULT CALLED TO, READ BACK BY AND VERIFIED WITH: NOLTE S,RN 01/09/21 2358 WAYK (NOTE) Elevated high sensitivity troponin I (hsTnI) values and significant  changes across serial measurements may suggest ACS but many other  chronic and acute conditions are known to elevate hsTnI results.  Refer to the Links section for chest pain algorithms and additional  guidance. Performed at [facility], [place] [address] [place] [address]   CBC     Status: None   Collection Time: 01/09/21 10:30 PM  Result Value Ref Range   WBC 8.0 4.0 - 10.5 K/uL   RBC 5.01 3.87 - 5.11 MIL/uL   Hemoglobin 14.9 12.0 - 15.0 g/dL   HCT 45.1 36.0 - 46.0 %  MCV 90.0 80.0 - 100.0 fL   MCH 29.7 26.0 - 34.0 pg   MCHC 33.0 30.0 - 36.0 g/dL   RDW 14.1 11.5 - 15.5 %   Platelets 188 150 - 400 K/uL   nRBC 0.0 0.0 - 0.2 %    Comment: Performed at [facility], [place] [address], [place], [place] [address]  CBG monitoring, ED     Status: Abnormal   Collection Time: 01/09/21 10:50 PM  Result Value Ref Range   Glucose-Capillary 215 (H) 70 - 99 mg/dL    Comment: Glucose reference range applies only to samples taken after fasting for at least 8 hours.   Comment 1 Notify RN    Comment 2 Document in Chart   Troponin I (High Sensitivity)     Status: Abnormal   Collection Time: 01/10/21 12:30 AM  Result Value Ref Range   Troponin I (High Sensitivity) 1,018 (HH) <18 ng/L    Comment: CRITICAL VALUE NOTED.  VALUE IS CONSISTENT WITH PREVIOUSLY REPORTED AND CALLED VALUE. (NOTE) Elevated high sensitivity troponin I (hsTnI) values and significant  changes across serial measurements may suggest ACS but many other  chronic and acute conditions are known to elevate hsTnI results.  Refer to the Links section for chest pain algorithms and additional  guidance. Performed at [facility], [place] [address], [place], [place] [address]   CBC     Status: None   Collection Time: 01/10/21  4:47 AM  Result  Value Ref Range   WBC 7.9 4.0 - 10.5 K/uL   RBC 4.70 3.87 - 5.11 MIL/uL   Hemoglobin 13.7 12.0 - 15.0 g/dL   HCT 42.5 36.0 - 46.0 %   MCV 90.4 80.0 - 100.0 fL   MCH 29.1 26.0 - 34.0 pg   MCHC 32.2 30.0 - 36.0 g/dL   RDW 14.6 11.5 - 15.5 %   Platelets 235 150 - 400 K/uL   nRBC 0.0 0.0 - 0.2 %    Comment: Performed at [facility], [place] [address], [place], [place] [address]    DG Chest Portable 1 View  Result Date: 01/09/2021 CLINICAL DATA:  Chest pain EXAM: PORTABLE CHEST 1 VIEW COMPARISON:  09/20/2020 FINDINGS: Cardiac shadow is stable. The lungs are well aerated bilaterally. No focal infiltrate or effusion is seen. No bony abnormality is noted. IMPRESSION: No active disease. Electronically Signed   By: Inez Catalina M.D.   On: 01/09/2021 22:59    Review of Systems  Constitutional:  Negative for fever.  HENT:  Negative for sore throat.   Eyes:  Negative for pain.  Respiratory:  Negative for cough and shortness of breath.   Cardiovascular:  Positive for chest pain. Negative for palpitations and leg swelling.  Gastrointestinal:  Negative for abdominal pain and blood in stool.  Genitourinary:  Negative for difficulty urinating.  Skin: Negative.   Neurological:  Negative for dizziness and syncope.  Blood pressure (!) 117/91, pulse 62, temperature 97.9 F (36.6 C), temperature source Oral, resp. rate 15, SpO2 100 %. Physical Exam Constitutional:      Appearance: She is well-developed.  Eyes:     Extraocular Movements: Extraocular movements intact.     Pupils: Pupils are equal, round, and reactive to light.  Neck:     Vascular: No JVD.  Cardiovascular:     Rate and Rhythm: Normal rate and regular rhythm.     Heart sounds: Murmur (2/6 systolic murmur noted) heard.  Pulmonary:  Effort: Pulmonary effort is normal.     Comments: Clear to auscultation Abdominal:     General: Bowel sounds are normal. There is no abdominal bruit.     Palpations: Abdomen is soft. There is no  hepatomegaly.  Musculoskeletal:     Cervical back: Normal range of motion and neck supple.     Comments: No clubbing, cyanosis or edema  Neurological:     Mental Status: She is alert and oriented to person, place, and time.    Assessment/Plan: Acute NSTEMI Coronary artery disease, history of inferolateral wall STEMI in November 2015.  Status post PCI to RCA. Multivessel CAD. Hypertension. Diabetes mellitus. Hyperlipidemia. History of CVA. Peripheral vascular disease.  He is renal stenting and femoropopliteal bypass in the past. 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 Chronic kidney disease stage IIIB Tobacco abuse. Plan Discussed with patient at length.  Various options of treatment, I.e., medical versus invasive left cardiac catheterization, possible PTCA stenting,it's risk and benefit I.e.death, MI, stroke, need for emergency CABG,local vascular complications, risk of infection, bleeding, worsening renal function risk of restenosis.  radial versus femoral approach.  States wants to discuss with her daughter first, but most likely will proceed with PCI .patient also given the option regarding radial approach as she has significant peripheral vascular disease, marked wanted to proceed with femoral approach understands the risks and benefits.  We will schedule her for tomorrow.  Patient also refusing for CABG, so we will start dual antiplatelet medications today [name] 01/10/2021, 7:40 AM

## 2021-01-10 NOTE — Progress Notes (Signed)
PROGRESS NOTE    [name]  QIO:[id number] DOB: [birth year] DOA: 01/09/2021 PCP: [name], MD    Chief Complaint  Patient presents with   Chest Pain   Emesis    Brief Narrative:   This is a no charge note, as patient was admitted earlier today by Dr. Ulyses Southward chart, imaging and labs were reviewed, patient was seen and examined.  Assessment & Plan:   Principal Problem:   NSTEMI (non-ST elevated myocardial infarction) ([place]) Active Problems:   Hyperlipidemia   Hypertension   DM (diabetes mellitus) type II uncontrolled, periph vascular disorder (HCC)   CKD (chronic kidney disease) stage 3, GFR 30-59 ml/min (HCC)   CAD (coronary artery disease)   NSTEMI (non-ST elevated myocardial infarction)  -Since findings significant for NSTEMI, cardiology input greatly appreciated, plan for cardiac cath tomorrow, new with aspirin and Plavix (per cardiology note patient would not agree to CABG), she is on beta-blockers, statin as well.    Hyperlipidemia Pt has allergy to lipitor documented. Will try pravachol to see if tolerates better.      Hypertension Continue norvasc, metoprolol, spironolactone, Imdur     DM (diabetes mellitus) type II uncontrolled, periph vascular disorder  Amaryl held while in hospital to avoid hypoglycemia.  Sugars were monitored with meals and at bedtime.  Corrective insulin ordered to maintain glycemic control.  Check hemoglobin A1c.     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 Monitor electrolytes and renal function in am with labs. IVF hydration with LR at 75 ml/hr overnight.      CAD (coronary artery disease) Chronic. Pt had cardiac stent placed 3 years ago she states and takes her aspirin daily.       Tobacco use Nicotine patch to prevent withdrawls. Pt encouraged to quit tobacco use for her health.     DVT prophylaxis: on heparin GTT Code Status: Full Family Communication: Discussed with the patient, none at bedside Disposition:    Status is: Inpatient  Remains inpatient appropriate because:IV treatments appropriate due to intensity of illness or inability to take PO  Dispo: The patient is from: Home              Anticipated d/c is to: Home              Patient currently is not medically stable to d/c.   Difficult to place patient No       Consultants:  cardiology   Subjective:  Currently denies any chest pain or shortness of breath.  Objective: Vitals:   01/10/21 1015 01/10/21 1030 01/10/21 1045 01/10/21 1100  BP: (!) 148/72 (!) 116/58 (!) 144/70 130/69  Pulse:      Resp: 16 15 19 16   Temp:      TempSrc:      SpO2:      Weight:      Height:        Intake/Output Summary (Last 24 hours) at 01/10/2021 1134 Last data filed at 01/10/2021 0847 Gross per 24 hour  Intake --  Output 500 ml  Net -500 ml   Filed Weights   01/10/21 0900  Weight: 74 kg    Examination:  General exam: Appears calm and comfortable  Respiratory system: Clear to auscultation. Respiratory effort normal. Cardiovascular system: S1 & S2 heard, RRR. No JVD, murmurs, rubs, gallops or clicks. No pedal edema. Gastrointestinal system: Abdomen is nondistended, soft and nontender. No organomegaly or masses felt. Normal bowel sounds heard. Central nervous system: Alert and oriented. No  focal neurological deficits. Extremities: Symmetric 5 x 5 power. Skin: No rashes, lesions or ulcers Psychiatry: Judgement and insight appear normal. Mood & affect appropriate.     Data Reviewed: I have personally reviewed following labs and imaging studies  CBC: Recent Labs  Lab 01/09/21 2230 01/10/21 0447  WBC 8.0 7.9  HGB 14.9 13.7  HCT 45.1 42.5  MCV 90.0 90.4  PLT 188 010    Basic Metabolic Panel: Recent Labs  Lab 01/09/21 2230 01/10/21 0939  NA 138 137  K 4.6 4.2  CL 104 106  CO2 25 23  GLUCOSE 245* 171*  BUN 23 23  CREATININE 1.54* 1.36*  CALCIUM 9.9 9.1    GFR: Estimated Creatinine Clearance: 34.1 mL/min (A)  (by C-G formula based on SCr of 1.36 mg/dL (H)).  Liver Function Tests: No results for input(s): AST, ALT, ALKPHOS, BILITOT, PROT, ALBUMIN in the last 168 hours.  CBG: Recent Labs  Lab 01/09/21 2250 01/10/21 0802  GLUCAP 215* 128*     Recent Results (from the past 240 hour(s))  Resp Panel by RT-PCR (Flu A&B, Covid) Nasopharyngeal Swab     Status: None   Collection Time: 01/09/21 12:18 AM   Specimen: Nasopharyngeal Swab; Nasopharyngeal(NP) swabs in vial transport medium  Result Value Ref Range Status   SARS Coronavirus 2 by RT PCR NEGATIVE NEGATIVE Final    Comment: (NOTE) SARS-CoV-2 target nucleic acids are NOT DETECTED.  The SARS-CoV-2 RNA is generally detectable in upper respiratory specimens during the acute phase of infection. The lowest concentration of SARS-CoV-2 viral copies this assay can detect is 138 copies/mL. A negative result does not preclude SARS-Cov-2 infection and should not be used as the sole basis for treatment or other patient management decisions. A negative result may occur with  improper specimen collection/handling, submission of specimen other than nasopharyngeal swab, presence of viral mutation(s) within the areas targeted by this assay, and inadequate number of viral copies(<138 copies/mL). A negative result must be combined with clinical observations, patient history, and epidemiological information. The expected result is Negative.  Fact Sheet for Patients:  EntrepreneurPulse.com.au  Fact Sheet for Healthcare Providers:  IncredibleEmployment.be  This test is no t yet approved or cleared by the [place] FDA and  has been authorized for detection and/or diagnosis of SARS-CoV-2 by FDA under an Emergency Use Authorization (EUA). This EUA will remain  in effect (meaning this test can be used) for the duration of the COVID-19 declaration under Section 564(b)(1) of the Act, 21 U.S.C.section 360bbb-3(b)(1),  unless the authorization is terminated  or revoked sooner.       Influenza A by PCR NEGATIVE NEGATIVE Final   Influenza B by PCR NEGATIVE NEGATIVE Final    Comment: (NOTE) The Xpert Xpress SARS-CoV-2/FLU/RSV plus assay is intended as an aid in the diagnosis of influenza from Nasopharyngeal swab specimens and should not be used as a sole basis for treatment. Nasal washings and aspirates are unacceptable for Xpert Xpress SARS-CoV-2/FLU/RSV testing.  Fact Sheet for Patients: EntrepreneurPulse.com.au  Fact Sheet for Healthcare Providers: IncredibleEmployment.be  This test is not yet approved or cleared by the [place] FDA and has been authorized for detection and/or diagnosis of SARS-CoV-2 by FDA under an Emergency Use Authorization (EUA). This EUA will remain in effect (meaning this test can be used) for the duration of the COVID-19 declaration under Section 564(b)(1) of the Act, 21 U.S.C. section 360bbb-3(b)(1), unless the authorization is terminated or revoked.  Performed at [facility], [place]  [address], [place], [place] [address]          Radiology Studies: DG Chest Portable 1 View  Result Date: 01/09/2021 CLINICAL DATA:  Chest pain EXAM: PORTABLE CHEST 1 VIEW COMPARISON:  09/20/2020 FINDINGS: Cardiac shadow is stable. The lungs are well aerated bilaterally. No focal infiltrate or effusion is seen. No bony abnormality is noted. IMPRESSION: No active disease. Electronically Signed   By: Inez Catalina M.D.   On: 01/09/2021 22:59        Scheduled Meds:  amLODipine  5 mg Oral Daily   aspirin EC  81 mg Oral Daily   atorvastatin  80 mg Oral Daily   clopidogrel  75 mg Oral Daily   insulin aspart  0-5 Units Subcutaneous QHS   insulin aspart  0-9 Units Subcutaneous TID WC   metoprolol tartrate  25 mg Oral BID   nicotine  14 mg Transdermal Daily   nitroGLYCERIN  1 inch Topical Q6H   pantoprazole (PROTONIX) IV  40 mg  Intravenous Q24H   sodium chloride flush  3 mL Intravenous Q12H   Continuous Infusions:  heparin 750 Units/hr (01/10/21 0932)   lactated ringers 75 mL/hr at 01/10/21 0227     LOS: 0 days     Phillips Climes, MD [facility]   To contact the attending provider between 7A-7P or the covering provider during after hours 7P-7A, please log into the web site [email or web address] and access using universal [place] password for that web site. If you do not have the password, please call the hospital operator.  01/10/2021, 11:34 AM

## 2021-01-10 NOTE — Progress Notes (Signed)
ANTICOAGULATION CONSULT NOTE - Follow Up Consult  Pharmacy Consult for Heparin Indication: chest pain/ACS  Allergies  Allergen Reactions   Keflex [Cephalexin] Shortness Of Breath and Other (See Comments)    Chest pain, headache   Lipitor [Atorvastatin] Shortness Of Breath and Other (See Comments)    Severe pain/cramping in legs- "I cannot walk, talk, or breathe" (pt is currently taking 20 mg daily 10/11/19)   Codeine Other (See Comments)    Disrupted patient's equilibrium- "Made my world flip upside down"   Hydrochlorothiazide Other (See Comments)    Other reaction(s): lightheaded, nausea   Hydrocodone-Acetaminophen Other (See Comments)    Other reaction(s): "get crazy"   Metformin Hcl Other (See Comments)    Other reaction(s): upset stomach   Tramadol Hcl Other (See Comments)    Other reaction(s): "get crazy"   Azor [Amlodipine-Olmesartan] Swelling, Palpitations and Rash    Amlodipine alone resumed 12/2013, tolerating   Lisinopril Cough   Penicillins Dermatitis and Other (See Comments)    Did it involve swelling of the face/tongue/throat, SOB, or low BP? No, just worsened eczema Did it involve sudden or severe rash/hives, skin peeling, or any reaction on the inside of your mouth or nose? No Did you need to seek medical attention at a hospital or doctor's office? No When did it last happen? "More than 10 years ago" If all above answers are "NO", may proceed with cephalosporin use.       Vital Signs: Temp: 98.1 F (36.7 C) (08/15 0835) Temp Source: Oral (08/15 0835) BP: 154/77 (08/15 0915) Pulse Rate: 63 (08/15 0915)  Labs: Recent Labs    01/09/21 2230 01/10/21 0030 01/10/21 0447 01/10/21 0820  HGB 14.9  --  13.7  --   HCT 45.1  --  42.5  --   PLT 188  --  235  --   HEPARINUNFRC  --   --   --  0.63  CREATININE 1.54*  --   --   --   TROPONINIHS 147* 1,018*  --   --     Assessment: 80 y/o F with chest pain to start heparin. CBC remains wnl - no BMP from this AM  has been collected. Level this AM was 0.63. No s/sx of bleeding.  Goal of Therapy:  Heparin level 0.3-0.7 units/ml Monitor platelets by anticoagulation protocol: Yes   Plan:  Given that level is still within goal but on higher end, given age/renal dysfunction, will decrease slightly to 750 units/hr. -F/u 8h heparin level -Monitor daily HL, CBC, and any s/sx of bleeding -F/u heparin plan post cath, on sched for tomorrow per Cards note from this AM.  Joetta Manners, PharmD, [facility] Clinical Pharmacist ED RPh Phone: [place]: [phone]

## 2021-01-10 NOTE — Progress Notes (Signed)
  Echocardiogram 2D Echocardiogram has been performed.  [name] 01/10/2021, 1:48 PM

## 2021-01-10 NOTE — Progress Notes (Signed)
ANTICOAGULATION CONSULT NOTE - Follow Up Consult  Pharmacy Consult for Heparin Indication: chest pain/ACS  Allergies  Allergen Reactions   Keflex [Cephalexin] Shortness Of Breath and Other (See Comments)    Chest pain, headache   Lipitor [Atorvastatin] Shortness Of Breath and Other (See Comments)    Severe pain/cramping in legs- "I cannot walk, talk, or breathe" (pt is currently taking 20 mg daily 10/11/19)   Codeine Other (See Comments)    Disrupted patient's equilibrium- "Made my world flip upside down"   Hydrochlorothiazide Other (See Comments)    Other reaction(s): lightheaded, nausea   Hydrocodone-Acetaminophen Other (See Comments)    Other reaction(s): "get crazy"   Metformin Hcl Other (See Comments)    Other reaction(s): upset stomach   Tramadol Hcl Other (See Comments)    Other reaction(s): "get crazy"   Azor [Amlodipine-Olmesartan] Swelling, Palpitations and Rash    Amlodipine alone resumed 12/2013, tolerating   Lisinopril Cough   Penicillins Dermatitis and Other (See Comments)    Did it involve swelling of the face/tongue/throat, SOB, or low BP? No, just worsened eczema Did it involve sudden or severe rash/hives, skin peeling, or any reaction on the inside of your mouth or nose? No Did you need to seek medical attention at a hospital or doctor's office? No When did it last happen? "More than 10 years ago" If all above answers are "NO", may proceed with cephalosporin use.       Vital Signs: Temp: 98.1 F (36.7 C) (08/15 0835) Temp Source: Oral (08/15 0835) BP: 126/72 (08/15 1900) Pulse Rate: 69 (08/15 1900)  Labs: Recent Labs    01/09/21 2230 01/10/21 0030 01/10/21 0447 01/10/21 0820 01/10/21 0939 01/10/21 1110 01/10/21 1926  HGB 14.9  --  13.7  --   --   --   --   HCT 45.1  --  42.5  --   --   --   --   PLT 188  --  235  --   --   --   --   HEPARINUNFRC  --   --   --  0.63  --   --  0.47  CREATININE 1.54*  --   --   --  1.36*  --   --   TROPONINIHS  147* 1,018*  --   --  6,501* 6,571*  --     Assessment: 80 y/o F with chest pain to start heparin. CBC remains wnl - no BMP from this AM has been collected. Level this 0.47. No s/sx of bleeding.  Goal of Therapy:  Heparin level 0.3-0.7 units/ml Monitor platelets by anticoagulation protocol: Yes   Plan:  Continue Heparin drip at 750 units/hr. -Monitor daily HL, CBC, and any s/sx of bleeding -F/u heparin plan post cath, on sched for tomorrow per Cards note from this AM.  Alanda Slim, PharmD, [facility] Clinical Pharmacist Please see AMION for all Pharmacists' Contact Phone Numbers 01/10/2021, 8:20 PM

## 2021-01-10 NOTE — ED Provider Notes (Signed)
I assumed care of this patient.  Please see previous provider note for further details of Hx, PE.  Briefly patient is a 80 y.o. female who presented chest pain noted to have STD in inferior leads. These resolve when pain improves.  Currently pending labs and reassessment. Plan for admission.   CBC w/o leukocytosis or anemia. No electrolyte derangements. Mild AKI. Trop of 147. Hep gtt started. Spoke with Dr. Terrence Dupont. He will plan for cath in the morning. [facility] admission.  Marland Kitchen1-3 Lead EKG Interpretation  Date/Time: 01/10/2021 12:36 AM Performed by: Fatima Blank, MD Authorized by: Fatima Blank, MD     Interpretation: normal     ECG rate:  69   ECG rate assessment: normal     Rhythm: sinus rhythm     Ectopy: none     Conduction: normal   .Critical Care  Date/Time: 01/10/2021 12:37 AM Performed by: Fatima Blank, MD Authorized by: Fatima Blank, MD   Critical care provider statement:    Critical care time (minutes):  30   Critical care was necessary to treat or prevent imminent or life-threatening deterioration of the following conditions:  Cardiac failure   Critical care was time spent personally by me on the following activities:  Discussions with consultants, evaluation of patient's response to treatment, examination of patient, ordering and performing treatments and interventions, ordering and review of laboratory studies, ordering and review of radiographic studies, pulse oximetry, re-evaluation of patient's condition, obtaining history from patient or surrogate and review of old charts   Care discussed with: admitting provider        Adlee Paar, Grayce Sessions, MD 01/10/21 [phone]

## 2021-01-10 NOTE — ED Notes (Signed)
Pt to echo at this time 

## 2021-01-10 NOTE — ED Notes (Signed)
Providers made aware of Troponin of 147

## 2021-01-10 NOTE — Progress Notes (Signed)
ANTICOAGULATION CONSULT NOTE - Initial Consult  Pharmacy Consult for Heparin Indication: chest pain/ACS  Allergies  Allergen Reactions   Keflex [Cephalexin] Shortness Of Breath and Other (See Comments)    Chest pain, headache   Lipitor [Atorvastatin] Shortness Of Breath and Other (See Comments)    Severe pain/cramping in legs- "I cannot walk, talk, or breathe" (pt is currently taking 20 mg daily 10/11/19)   Codeine Other (See Comments)    Disrupted patient's equilibrium- "Made my world flip upside down"   Hydrochlorothiazide Other (See Comments)    Other reaction(s): lightheaded, nausea   Hydrocodone-Acetaminophen Other (See Comments)    Other reaction(s): "get crazy"   Metformin Hcl Other (See Comments)    Other reaction(s): upset stomach   Tramadol Hcl Other (See Comments)    Other reaction(s): "get crazy"   Azor [Amlodipine-Olmesartan] Swelling, Palpitations and Rash    Amlodipine alone resumed 12/2013, tolerating   Lisinopril Cough   Penicillins Dermatitis and Other (See Comments)    Did it involve swelling of the face/tongue/throat, SOB, or low BP? No, just worsened eczema Did it involve sudden or severe rash/hives, skin peeling, or any reaction on the inside of your mouth or nose? No Did you need to seek medical attention at a hospital or doctor's office? No When did it last happen? "More than 10 years ago" If all above answers are "NO", may proceed with cephalosporin use.       Vital Signs: Temp: 97.9 F (36.6 C) (08/14 2234) Temp Source: Oral (08/14 2234) BP: 154/75 (08/15 0015) Pulse Rate: 67 (08/15 0015)  Labs: Recent Labs    01/09/21 2230  HGB 14.9  HCT 45.1  PLT 188  CREATININE 1.54*  TROPONINIHS 147*    Assessment: 80 y/o F with chest pain to start heparin. CBC good, noted renal dysfunction. PTA meds reviewed.   Goal of Therapy:  Heparin level 0.3-0.7 units/ml Monitor platelets by anticoagulation protocol: Yes   Plan:  Heparin 3000 units  BOLUS Start heparin drip at 800 units/hr 0800 Heparin level Daily CBC/Heparin level Monitor for bleeding  Narda Bonds, PharmD, BCPS Clinical Pharmacist Phone: [phone]

## 2021-01-10 NOTE — ED Notes (Signed)
Pt alert, NAD, calm, interactive, watching TV, VSS, denies pain or other sx or complaints at this time.

## 2021-01-10 NOTE — H&P (View-Only) (Signed)
Reason for Consult:chest pain/elevated high sensitivity troponin I Referring Physician: [facility] hospital[facility]  [name] is an 80 y.o. female.  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 presented as STEMI requiring PTCA stenting to 100% and RCA using 2.7530 mm long and 2.7512 mm long science Alpine drug-eluting stent, noted to have moderate ostial LAD stenosis, hypertension, hyperlipidemia, diabetes mellitus, abdominal aortic aneurysm repair in the past, history of renal artery stenosis status post stenting in the past,history of femoropopliteal bypass in the past,history of CVA , tobacco abuse, actively smokes half pack per day, [facility] complaining ofretrosternal and left-sided chest pain described as pressure that radiated toward 10 with minimal exertion radiating to neck and both shoulders.  States took 2 sublingual nitroglycerin on Saturday with relief and yesterday again took 2 sublingual nitroglycerin with partial relief.  The associated with nausea and vomiting.  States chest pain occurs with minimal exertion with walking at home.  Denies any palpitation, lightheadedness or syncope.  Denies PND, orthopnea or leg swelling.  Patient had EKG done in the ED, which showed normal sinus rhythm with nonspecific minor ST-T wave changes.  First set of troponin high was minimally elevated.  Patient was started on heparin and received Nitropaste with relief of chest pain.  Patient presently denies any chest pain.states has not been very compliant with her medications and has missed aspirin.  Past Medical History:  Diagnosis Date   AAA (abdominal aortic aneurysm) ([place])    Aneurysm ([facility])    Arthritis    osteoarthritis   Constipation    Diabetes mellitus    Type 2, on Glimiperide   GERD (gastroesophageal reflux disease)    Glaucoma    Hyperlipidemia    Hypertension     Kidney stones    Myocardial infarction [facility])    Peripheral vascular disease ([place])    Pneumonia    Stroke ([place]) 01/2015   with left side weakness    TIA (transient ischemic attack)    Urinary incontinence     Past Surgical History:  Procedure Laterality Date   ABDOMINAL AORTAGRAM N/A 03/13/2014   Procedure: ABDOMINAL Maxcine Ham;  Surgeon: Elam Dutch, MD;  Location: [facility] CATH LAB;  Service: Cardiovascular;  Laterality: N/A;   ABDOMINAL AORTIC ANEURYSM REPAIR     ABDOMINAL AORTIC ANEURYSM REPAIR  ?2012   ABDOMINAL HYSTERECTOMY     partial   BACK SURGERY     CARDIAC CATHETERIZATION     CORONARY ANGIOPLASTY  03/2015   EYE SURGERY Right    laser surgery for blood behind eye, loss of sight   FEMORAL-POPLITEAL BYPASS GRAFT Left 07/06/2014   Procedure: BYPASS GRAFT FEMORAL-POPLITEAL ARTERY;  Surgeon: Elam Dutch, MD;  Location: [place];  Service: Vascular;  Laterality: Left;   HERNIA REPAIR     JOINT REPLACEMENT     LEFT HEART CATHETERIZATION WITH CORONARY ANGIOGRAM N/A 04/12/2014   Procedure: LEFT HEART CATHETERIZATION WITH CORONARY ANGIOGRAM;  Surgeon: Clent Demark, MD;  Location: [place] CATH LAB;  Service: Cardiovascular;  Laterality: N/A;   PERCUTANEOUS CORONARY STENT INTERVENTION (PCI-S)  04/12/2014   Procedure: PERCUTANEOUS CORONARY STENT INTERVENTION (PCI-S);  Surgeon: Clent Demark, MD;  Location: [facility] CATH LAB;  Service: Cardiovascular;;  prox and mid RCA   PERIPHERAL VASCULAR INTERVENTION  06/25/2019   Procedure: PERIPHERAL VASCULAR INTERVENTION;  Surgeon: Elam Dutch, MD;  Location: [place] CV LAB;  Service: Cardiovascular;;  Lt Renal and Rt Renal   RENAL ANGIOGRAPHY Bilateral 06/25/2019   Procedure: RENAL ANGIOGRAPHY;  Surgeon: Elam Dutch, MD;  Location: [place] CV LAB;  Service: Cardiovascular;  Laterality: Bilateral;   TOTAL HIP ARTHROPLASTY Left 05/18/2015   TOTAL HIP ARTHROPLASTY Left 05/18/2015   Procedure: LEFT TOTAL HIP ARTHROPLASTY ANTERIOR  APPROACH;  Surgeon: Mcarthur Rossetti, MD;  Location: [place];  Service: Orthopedics;  Laterality: Left;   TOTAL KNEE ARTHROPLASTY Left ~ 2003   TUBAL LIGATION     uterine tumor     VENTRAL HERNIA REPAIR      Family History  Problem Relation Age of Onset   Diabetes Mother    Stroke Mother    Hypertension Father    Stroke Father    Hyperlipidemia Sister    Hypertension Sister    Aneurysm Sister    Hyperlipidemia Brother    Hypertension Brother     Social History:  reports that she has been smoking cigarettes. She has a 20.00 pack-year smoking history. She has never used smokeless tobacco. She reports that she does not drink alcohol and does not use drugs.  Allergies:  Allergies  Allergen Reactions   Keflex [Cephalexin] Shortness Of Breath and Other (See Comments)    Chest pain, headache   Lipitor [Atorvastatin] Shortness Of Breath and Other (See Comments)    Severe pain/cramping in legs- "I cannot walk, talk, or breathe" (pt is currently taking 20 mg daily 10/11/19)   Codeine Other (See Comments)    Disrupted patient's equilibrium- "Made my world flip upside down"   Hydrochlorothiazide Other (See Comments)    Other reaction(s): lightheaded, nausea   Hydrocodone-Acetaminophen Other (See Comments)    Other reaction(s): "get crazy"   Metformin Hcl Other (See Comments)    Other reaction(s): upset stomach   Tramadol Hcl Other (See Comments)    Other reaction(s): "get crazy"   Azor [Amlodipine-Olmesartan] Swelling, Palpitations and Rash    Amlodipine alone resumed 12/2013, tolerating   Lisinopril Cough   Penicillins Dermatitis and Other (See Comments)    Did it involve swelling of the face/tongue/throat, SOB, or low BP? No, just worsened eczema Did it involve sudden or severe rash/hives, skin peeling, or any reaction on the inside of your mouth or nose? No Did you need to seek medical attention at a hospital or doctor's office? No When did it last happen? "More than 10 years  ago" If all above answers are "NO", may proceed with cephalosporin use.     Medications: I have reviewed the patient's current medications.  Results for orders placed or performed during the hospital encounter of 01/09/21 (from the past 48 hour(s))  Resp Panel by RT-PCR (Flu A&B, Covid) Nasopharyngeal Swab     Status: None   Collection Time: 01/09/21 12:18 AM   Specimen: Nasopharyngeal Swab; Nasopharyngeal(NP) swabs in vial transport medium  Result Value Ref Range   SARS Coronavirus 2 by RT PCR NEGATIVE NEGATIVE    Comment: (NOTE) SARS-CoV-2 target nucleic acids are NOT DETECTED.  The SARS-CoV-2 RNA is generally detectable in upper respiratory specimens during the acute phase of infection. The lowest concentration of SARS-CoV-2 viral copies this assay can detect is 138 copies/mL. A negative result does not preclude SARS-Cov-2 infection and should not be used as the sole basis for treatment or other patient management decisions. A negative result may occur with  improper specimen collection/handling, submission of specimen other than nasopharyngeal swab, presence of viral mutation(s) within the areas targeted by this  assay, and inadequate number of viral copies(<138 copies/mL). A negative result must be combined with clinical observations, patient history, and epidemiological information. The expected result is Negative.  Fact Sheet for Patients:  EntrepreneurPulse.com.au  Fact Sheet for Healthcare Providers:  IncredibleEmployment.be  This test is no t yet approved or cleared by the [place] FDA and  has been authorized for detection and/or diagnosis of SARS-CoV-2 by FDA under an Emergency Use Authorization (EUA). This EUA will remain  in effect (meaning this test can be used) for the duration of the COVID-19 declaration under Section 564(b)(1) of the Act, 21 U.S.C.section 360bbb-3(b)(1), unless the authorization is terminated  or  revoked sooner.       Influenza A by PCR NEGATIVE NEGATIVE   Influenza B by PCR NEGATIVE NEGATIVE    Comment: (NOTE) The Xpert Xpress SARS-CoV-2/FLU/RSV plus assay is intended as an aid in the diagnosis of influenza from Nasopharyngeal swab specimens and should not be used as a sole basis for treatment. Nasal washings and aspirates are unacceptable for Xpert Xpress SARS-CoV-2/FLU/RSV testing.  Fact Sheet for Patients: EntrepreneurPulse.com.au  Fact Sheet for Healthcare Providers: IncredibleEmployment.be  This test is not yet approved or cleared by the [place] FDA and has been authorized for detection and/or diagnosis of SARS-CoV-2 by FDA under an Emergency Use Authorization (EUA). This EUA will remain in effect (meaning this test can be used) for the duration of the COVID-19 declaration under Section 564(b)(1) of the Act, 21 U.S.C. section 360bbb-3(b)(1), unless the authorization is terminated or revoked.  Performed at [facility], [place] [address], [place], [place] [address]   Basic metabolic panel     Status: Abnormal   Collection Time: 01/09/21 10:30 PM  Result Value Ref Range   Sodium 138 135 - 145 mmol/L   Potassium 4.6 3.5 - 5.1 mmol/L   Chloride 104 98 - 111 mmol/L   CO2 25 22 - 32 mmol/L   Glucose, Bld 245 (H) 70 - 99 mg/dL    Comment: Glucose reference range applies only to samples taken after fasting for at least 8 hours.   BUN 23 8 - 23 mg/dL   Creatinine, Ser 1.54 (H) 0.44 - 1.00 mg/dL   Calcium 9.9 8.9 - 10.3 mg/dL   GFR, Estimated 34 (L) >60 mL/min    Comment: (NOTE) Calculated using the CKD-EPI Creatinine Equation (2021)    Anion gap 9 5 - 15    Comment: Performed at [facility] [address], [place], [place] [address]  Troponin I (High Sensitivity)     Status: Abnormal   Collection Time: 01/09/21 10:30 PM  Result Value Ref Range   Troponin I (High Sensitivity) 147 (HH) <18 ng/L    Comment:  CRITICAL RESULT CALLED TO, READ BACK BY AND VERIFIED WITH: NOLTE S,RN 01/09/21 2358 WAYK (NOTE) Elevated high sensitivity troponin I (hsTnI) values and significant  changes across serial measurements may suggest ACS but many other  chronic and acute conditions are known to elevate hsTnI results.  Refer to the Links section for chest pain algorithms and additional  guidance. Performed at [facility], [place] [address], [place] [address]   CBC     Status: None   Collection Time: 01/09/21 10:30 PM  Result Value Ref Range   WBC 8.0 4.0 - 10.5 K/uL   RBC 5.01 3.87 - 5.11 MIL/uL   Hemoglobin 14.9 12.0 - 15.0 g/dL   HCT 45.1 36.0 - 46.0 %  MCV 90.0 80.0 - 100.0 fL   MCH 29.7 26.0 - 34.0 pg   MCHC 33.0 30.0 - 36.0 g/dL   RDW 14.1 11.5 - 15.5 %   Platelets 188 150 - 400 K/uL   nRBC 0.0 0.0 - 0.2 %    Comment: Performed at [facility], [place] [address] [place], [place] [address]  CBG monitoring, ED     Status: Abnormal   Collection Time: 01/09/21 10:50 PM  Result Value Ref Range   Glucose-Capillary 215 (H) 70 - 99 mg/dL    Comment: Glucose reference range applies only to samples taken after fasting for at least 8 hours.   Comment 1 Notify RN    Comment 2 Document in Chart   Troponin I (High Sensitivity)     Status: Abnormal   Collection Time: 01/10/21 12:30 AM  Result Value Ref Range   Troponin I (High Sensitivity) 1,018 (HH) <18 ng/L    Comment: CRITICAL VALUE NOTED.  VALUE IS CONSISTENT WITH PREVIOUSLY REPORTED AND CALLED VALUE. (NOTE) Elevated high sensitivity troponin I (hsTnI) values and significant  changes across serial measurements may suggest ACS but many other  chronic and acute conditions are known to elevate hsTnI results.  Refer to the Links section for chest pain algorithms and additional  guidance. Performed at [facility], [place] [address], [place], [place] [address]   CBC     Status: None   Collection Time: 01/10/21  4:47 AM  Result  Value Ref Range   WBC 7.9 4.0 - 10.5 K/uL   RBC 4.70 3.87 - 5.11 MIL/uL   Hemoglobin 13.7 12.0 - 15.0 g/dL   HCT 42.5 36.0 - 46.0 %   MCV 90.4 80.0 - 100.0 fL   MCH 29.1 26.0 - 34.0 pg   MCHC 32.2 30.0 - 36.0 g/dL   RDW 14.6 11.5 - 15.5 %   Platelets 235 150 - 400 K/uL   nRBC 0.0 0.0 - 0.2 %    Comment: Performed at [facility], [place] [address], [place], [place] [address]    DG Chest Portable 1 View  Result Date: 01/09/2021 CLINICAL DATA:  Chest pain EXAM: PORTABLE CHEST 1 VIEW COMPARISON:  09/20/2020 FINDINGS: Cardiac shadow is stable. The lungs are well aerated bilaterally. No focal infiltrate or effusion is seen. No bony abnormality is noted. IMPRESSION: No active disease. Electronically Signed   By: Inez Catalina M.D.   On: 01/09/2021 22:59    Review of Systems  Constitutional:  Negative for fever.  HENT:  Negative for sore throat.   Eyes:  Negative for pain.  Respiratory:  Negative for cough and shortness of breath.   Cardiovascular:  Positive for chest pain. Negative for palpitations and leg swelling.  Gastrointestinal:  Negative for abdominal pain and blood in stool.  Genitourinary:  Negative for difficulty urinating.  Skin: Negative.   Neurological:  Negative for dizziness and syncope.  Blood pressure (!) 117/91, pulse 62, temperature 97.9 F (36.6 C), temperature source Oral, resp. rate 15, SpO2 100 %. Physical Exam Constitutional:      Appearance: She is well-developed.  Eyes:     Extraocular Movements: Extraocular movements intact.     Pupils: Pupils are equal, round, and reactive to light.  Neck:     Vascular: No JVD.  Cardiovascular:     Rate and Rhythm: Normal rate and regular rhythm.     Heart sounds: Murmur (2/6 systolic murmur noted) heard.  Pulmonary:  Effort: Pulmonary effort is normal.     Comments: Clear to auscultation Abdominal:     General: Bowel sounds are normal. There is no abdominal bruit.     Palpations: Abdomen is soft. There is no  hepatomegaly.  Musculoskeletal:     Cervical back: Normal range of motion and neck supple.     Comments: No clubbing, cyanosis or edema  Neurological:     Mental Status: She is alert and oriented to person, place, and time.    Assessment/Plan: Acute NSTEMI Coronary artery disease, history of inferolateral wall STEMI in November 2015.  Status post PCI to RCA. Multivessel CAD. Hypertension. Diabetes mellitus. Hyperlipidemia. History of CVA. Peripheral vascular disease.  He is renal stenting and femoropopliteal bypass in the past. 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 Chronic kidney disease stage IIIB Tobacco abuse. Plan Discussed with patient at length.  Various options of treatment, I.e., medical versus invasive left cardiac catheterization, possible PTCA stenting,it's risk and benefit I.e.death, MI, stroke, need for emergency CABG,local vascular complications, risk of infection, bleeding, worsening renal function risk of restenosis.  radial versus femoral approach.  States wants to discuss with her daughter first, but most likely will proceed with PCI .patient also given the option regarding radial approach as she has significant peripheral vascular disease, marked wanted to proceed with femoral approach understands the risks and benefits.  We will schedule her for tomorrow.  Patient also refusing for CABG, so we will start dual antiplatelet medications today [name] 01/10/2021, 7:40 AM

## 2021-01-10 NOTE — H&P (Signed)
History and Physical    [name] NOT:[id number] DOB: 07-13-1940 DOA: 01/09/2021  PCP: [name], MD   Patient coming from: Home  Chief Complaint: Chest pain, SOB  HPI: [name] is a 80 y.o. female with medical history significant for  AAA, diabetes mellitus type 2, CKD 3B, hypertension, hyperlipidemia, STEMI in 2018 treated with stent placement who presents for dilation of chest pain.  She reports on the afternoon of August 14,022 she developed chest pain when she was walking around her house.  She states the chest pain was central in the left side of her chest and radiated into her left shoulder and upper back.  Chest pain was associated with shortness of breath and mild nausea but she did not have any vomiting.  She reports the chest pain would improve if she would sit down and rest but will come back if she started to exert herself.  She has not had any fever, chills, cough, syncope, vomiting, abdominal pain or diarrhea.  She states she lives alone and has not had any known sick contacts.  She called 911 and she was given aspirin was feeling better so she declined being transferred to the hospital.  A few hours later the chest pain came back and was more severe so she decided to call 911 and come to the hospital.  She reports the chest pain is a 9 out of 10 at its worst.  She smokes 1 pack of cigarettes a day.  She denies alcohol or illicit drug use.  ED Course: [name] has been hemodynamically stable in the emergency room.  EKG showed ST depressions in the inferior leads while patient was having chest pain these depressions resolved when the chest pain resolved.  When she arrived in the emergency room she was chest pain-free but she developed chest pain again and nitroglycerin paste was applied to the chest wall.  Initial troponin was elevated at 147.  CBC was unremarkable.  He was 138 potassium 4.6 chloride 104 bicarb 25 glucose 245 creatinine 1.54 BUN 23 . COVID-19 is negative.   Influenza A and B swab are negative.  Chest x-ray is reviewed and is negative for infiltrate or consolidations.  ER physician discussed with cardiology who stated they will evaluate patient and take for cardiac catheterization later today.  She is started on a heparin infusion for anticoagulation.  Hospitalist service been asked to admit for further management  Review of Systems:  General: Denies fever, chills, weight loss, night sweats.  Denies dizziness.  Denies change in appetite HENT: Denies head trauma, headache, denies change in hearing, tinnitus.  Denies nasal congestion or bleeding.  Denies sore throat, sores in mouth.  Denies difficulty swallowing Eyes: Denies blurry vision, pain in eye, drainage.  Denies discoloration of eyes. Neck: Denies pain.  Denies swelling.  Denies pain with movement. Cardiovascular: Reports chest pain. Denies palpitations.  Denies edema.  Denies orthopnea Respiratory: Reports shortness of breath. Denies cough.  Denies wheezing.  Denies sputum production Gastrointestinal: Reports mild nausea. Denies abdominal pain, swelling.  Denies vomiting, diarrhea.  Denies melena.  Denies hematemesis. Musculoskeletal: Denies limitation of movement.  Denies deformity or swelling. Denies arthralgias or myalgias. Genitourinary: Denies pelvic pain.  Denies urinary frequency or hesitancy.  Denies dysuria.  Skin: Denies rash.  Denies petechiae, purpura, ecchymosis. Neurological: Denies syncope. Denies seizure activity. Denies paresthesia.  Denies slurred speech, drooping face.  Denies visual change. Psychiatric: Denies depression, anxiety. Denies hallucinations.  Past Medical History:  Diagnosis Date  AAA (abdominal aortic aneurysm) (HCC)    Aneurysm (HCC)    Arthritis    osteoarthritis   Constipation    Diabetes mellitus    Type 2, on Glimiperide   GERD (gastroesophageal reflux disease)    Glaucoma    Hyperlipidemia    Hypertension    Kidney stones    Myocardial  infarction [facility])    Peripheral vascular disease ([place])    Pneumonia    Stroke ([place]) 01/2015   with left side weakness    TIA (transient ischemic attack)    Urinary incontinence     Past Surgical History:  Procedure Laterality Date   ABDOMINAL AORTAGRAM N/A 03/13/2014   Procedure: ABDOMINAL Maxcine Ham;  Surgeon: Elam Dutch, MD;  Location: [facility] CATH LAB;  Service: Cardiovascular;  Laterality: N/A;   ABDOMINAL AORTIC ANEURYSM REPAIR     ABDOMINAL AORTIC ANEURYSM REPAIR  ?2012   ABDOMINAL HYSTERECTOMY     partial   BACK SURGERY     CARDIAC CATHETERIZATION     CORONARY ANGIOPLASTY  03/2015   EYE SURGERY Right    laser surgery for blood behind eye, loss of sight   FEMORAL-POPLITEAL BYPASS GRAFT Left 07/06/2014   Procedure: BYPASS GRAFT FEMORAL-POPLITEAL ARTERY;  Surgeon: Elam Dutch, MD;  Location: [place];  Service: Vascular;  Laterality: Left;   HERNIA REPAIR     JOINT REPLACEMENT     LEFT HEART CATHETERIZATION WITH CORONARY ANGIOGRAM N/A 04/12/2014   Procedure: LEFT HEART CATHETERIZATION WITH CORONARY ANGIOGRAM;  Surgeon: Clent Demark, MD;  Location: [place] CATH LAB;  Service: Cardiovascular;  Laterality: N/A;   PERCUTANEOUS CORONARY STENT INTERVENTION (PCI-S)  04/12/2014   Procedure: PERCUTANEOUS CORONARY STENT INTERVENTION (PCI-S);  Surgeon: Clent Demark, MD;  Location: [facility] CATH LAB;  Service: Cardiovascular;;  prox and mid RCA   PERIPHERAL VASCULAR INTERVENTION  06/25/2019   Procedure: PERIPHERAL VASCULAR INTERVENTION;  Surgeon: Elam Dutch, MD;  Location: [place] CV LAB;  Service: Cardiovascular;;  Lt Renal and Rt Renal   RENAL ANGIOGRAPHY Bilateral 06/25/2019   Procedure: RENAL ANGIOGRAPHY;  Surgeon: Elam Dutch, MD;  Location: [place] CV LAB;  Service: Cardiovascular;  Laterality: Bilateral;   TOTAL HIP ARTHROPLASTY Left 05/18/2015   TOTAL HIP ARTHROPLASTY Left 05/18/2015   Procedure: LEFT TOTAL HIP ARTHROPLASTY ANTERIOR APPROACH;  Surgeon: Mcarthur Rossetti, MD;  Location: [place];  Service: Orthopedics;  Laterality: Left;   TOTAL KNEE ARTHROPLASTY Left ~ 2003   TUBAL LIGATION     uterine tumor     VENTRAL HERNIA REPAIR      Social History  reports that she has been smoking cigarettes. She has a 20.00 pack-year smoking history. She has never used smokeless tobacco. She reports that she does not drink alcohol and does not use drugs.  Allergies  Allergen Reactions   Keflex [Cephalexin] Shortness Of Breath and Other (See Comments)    Chest pain, headache   Lipitor [Atorvastatin] Shortness Of Breath and Other (See Comments)    Severe pain/cramping in legs- "I cannot walk, talk, or breathe" (pt is currently taking 20 mg daily 10/11/19)   Codeine Other (See Comments)    Disrupted patient's equilibrium- "Made my world flip upside down"   Hydrochlorothiazide Other (See Comments)    Other reaction(s): lightheaded, nausea   Hydrocodone-Acetaminophen Other (See Comments)    Other reaction(s): "get crazy"   Metformin Hcl Other (See Comments)    Other reaction(s): upset stomach   Tramadol Hcl Other (See Comments)  Other reaction(s): "get crazy"   Azor [Amlodipine-Olmesartan] Swelling, Palpitations and Rash    Amlodipine alone resumed 12/2013, tolerating   Lisinopril Cough   Penicillins Dermatitis and Other (See Comments)    Did it involve swelling of the face/tongue/throat, SOB, or low BP? No, just worsened eczema Did it involve sudden or severe rash/hives, skin peeling, or any reaction on the inside of your mouth or nose? No Did you need to seek medical attention at a hospital or doctor's office? No When did it last happen? "More than 10 years ago" If all above answers are "NO", may proceed with cephalosporin use.     Family History  Problem Relation Age of Onset   Diabetes Mother    Stroke Mother    Hypertension Father    Stroke Father    Hyperlipidemia Sister    Hypertension Sister    Aneurysm Sister    Hyperlipidemia Brother     Hypertension Brother      Prior to Admission medications   Medication Sig Start Date End Date Taking? Authorizing Provider  acetaminophen (TYLENOL) 650 MG CR tablet Take 650 mg by mouth at bedtime as needed for pain.     [provider]  albuterol (PROVENTIL HFA;VENTOLIN HFA) 108 (90 Base) MCG/ACT inhaler Inhale 2 puffs into the lungs every 4 (four) hours as needed for wheezing or shortness of breath (or coughing). 2/95/62   Delora Fuel, MD  amLODipine (NORVASC) 5 MG tablet Take 5 mg by mouth daily. 09/01/19   [provider]  aspirin EC 81 MG tablet Take 1 tablet (81 mg total) by mouth daily. Patient taking differently: Take 243 mg by mouth daily as needed (chest pain).  06/25/19   Elam Dutch, MD  atorvastatin (LIPITOR) 40 MG tablet Take 1 tablet (40 mg total) by mouth daily. 10/15/19 12/14/19  Georgette Shell, MD  blood glucose meter kit and supplies KIT Dispense based on patient and insurance preference. Use up to four times daily as directed. (FOR ICD-9 250.00, 250.01). 10/15/19   Georgette Shell, MD  Blood Glucose Monitoring Suppl (BAYER BREEZE 2 SYSTEM) w/Device KIT 1 each by Does not apply route 2 (two) times daily. 10/15/19   Georgette Shell, MD  busPIRone (BUSPAR) 5 MG tablet Take 1 tablet (5 mg total) by mouth 2 (two) times daily. 10/15/19   Georgette Shell, MD  clopidogrel (PLAVIX) 75 MG tablet Take 75 mg by mouth daily.    [provider]  esomeprazole (NEXIUM) 40 MG capsule Take 40 mg by mouth daily as needed (acid reflux).     [provider]  glimepiride (AMARYL) 2 MG tablet Take 1 tablet (2 mg total) by mouth every morning. 10/15/19 12/14/19  Georgette Shell, MD  isosorbide mononitrate (IMDUR) 60 MG 24 hr tablet Take 60 mg by mouth daily. 02/24/15   [provider]  metoprolol succinate (TOPROL-XL) 100 MG 24 hr tablet Take 100 mg by mouth daily. 06/09/19   [provider]  nitroGLYCERIN (NITROSTAT) 0.4  MG SL tablet Place 0.4 mg under the tongue every 5 (five) minutes as needed for chest pain.    [provider]  Polyethyl Glycol-Propyl Glycol (LUBRICANT EYE DROPS) 0.4-0.3 % SOLN Place 1 drop into both eyes 3 (three) times daily as needed (dry/irritated eyes.). [employer] Advanced Eye Relief Dry Eye Lubricant Eye Drops    [provider]  spironolactone (ALDACTONE) 25 MG tablet Take 25 mg by mouth daily. 08/04/19   [provider]    Physical Exam: Vitals:   01/09/21 2330 01/09/21 2345 01/10/21 0000 01/10/21 0015  BP: (!) 157/93 135/74 (!) 147/76 (!) 154/75  Pulse: 68   67  Resp: _0 (!) 30  Temp:      TempSrc:      SpO2: 95%   95%    Constitutional: NAD, calm, comfortable Vitals:   01/09/21 2330 01/09/21 2345 01/10/21 0000 01/10/21 0015  BP: (!) 157/93 135/74 (!) 147/76 (!) 154/75  Pulse: 68   67  Resp: _1 (!) 30  Temp:      TempSrc:      SpO2: 95%   95%   General: WDWN, Alert and oriented x3.  Eyes: EOMI, PERRL, conjunctivae normal.  Sclera nonicteric HENT:  [place]/AT, external ears normal.  Nares patent without epistasis.  Mucous membranes are moist. Neck: Soft, normal range of motion, supple, no masses, no thyromegaly.  Trachea midline Respiratory: Equal breath sounds with diffuse mild rales, no rhonchi, no wheezing, no crackles. Normal respiratory effort. No accessory muscle use.  Cardiovascular: Regular rate and rhythm, no murmurs / rubs / gallops. No extremity edema. 1+ pedal pulses. Abdomen: Soft, no tenderness, nondistended, no rebound or guarding. No masses palpated. Bowel sounds normoactive Musculoskeletal: FROM. no cyanosis. No joint deformity upper and lower extremities.  Normal muscle tone.  Skin: Warm, dry, intact no rashes, lesions, ulcers. No induration Neurologic: CN 2-12 grossly intact.  Normal speech.  Sensation intact to touch. Strength 5/5 in all extremities.   Psychiatric: Normal judgment and insight.  Normal mood.     Labs on Admission: I have personally reviewed following labs and imaging studies  CBC: Recent Labs  Lab 01/09/21 2230  WBC 8.0  HGB 14.9  HCT 45.1  MCV 90.0  PLT 409    Basic Metabolic Panel: Recent Labs  Lab 01/09/21 2230  NA 138  K 4.6  CL 104  CO2 25  GLUCOSE 245*  BUN 23  CREATININE 1.54*  CALCIUM 9.9    GFR: CrCl cannot be calculated (Unknown ideal weight.).  Liver Function Tests: No results for input(s): AST, ALT, ALKPHOS, BILITOT, PROT, ALBUMIN in the last 168 hours.  Urine analysis:    Component Value Date/Time   COLORURINE RED (A) 09/03/2015 2312   APPEARANCEUR TURBID (A) 09/03/2015 2312   LABSPEC 1.011 09/03/2015 2312   PHURINE 5.5 09/03/2015 2312   GLUCOSEU NEGATIVE 09/03/2015 2312   HGBUR LARGE (A) 09/03/2015 2312   BILIRUBINUR NEGATIVE 09/03/2015 2312   KETONESUR 15 (A) 09/03/2015 2312   PROTEINUR 100 (A) 09/03/2015 2312   UROBILINOGEN 0.2 07/04/2014 0319   NITRITE NEGATIVE 09/03/2015 2312   LEUKOCYTESUR LARGE (A) 09/03/2015 2312    Radiological Exams on Admission: DG Chest Portable 1 View  Result Date: 01/09/2021 CLINICAL DATA:  Chest pain EXAM: PORTABLE CHEST 1 VIEW COMPARISON:  09/20/2020 FINDINGS: Cardiac shadow is stable. The lungs are well aerated bilaterally. No focal infiltrate or effusion is seen. No bony abnormality is noted. IMPRESSION: No active disease. Electronically Signed   By: Inez Catalina M.D.   On: 01/09/2021 22:59    EKG: Independently reviewed.  EKG shows normal sinus rhythm with LVH.  There is nonspecific ST inferior changes.  Patient has inferior ST depression when she is having acute chest pain that resolved when chest pain resolves.  QTc 445  Assessment/Plan Principal Problem:   NSTEMI (non-ST elevated myocardial infarction)  [name] is admitted to cardiac telemetry floor.  Pt with elevated  troponin level. Serial troponin ordered to track.  Cardiology consulted by ER physician and he stated cardiology  planning for cardiac catheterization today Nitroglycerin paste to chest wall placed. Dilaudid ordered for pain control as needed Supplemental oxygen to keep O2 sat between 92-96%. Incentive spirometer every 2 hours while awake Aspirin daily  Active Problems:   Hyperlipidemia Pt has allergy to lipitor documented. Will try pravachol to see if tolerates better.     Hypertension Continue norvasc, metoprolol, spironolactone, Imdur    DM (diabetes mellitus) type II uncontrolled, periph vascular disorder  Amaryl held while in hospital to avoid hypoglycemia.  Sugars were monitored with meals and at bedtime.  Corrective insulin ordered to maintain glycemic control.  Check hemoglobin A1c.    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 Monitor electrolytes and renal function in am with labs. IVF hydration with LR at 75 ml/hr overnight.     CAD (coronary artery disease) Chronic. Pt had cardiac stent placed 3 years ago she states and takes her aspirin daily.      Tobacco use Nicotine patch to prevent withdrawls. Pt encouraged to quit tobacco use for her health.     DVT prophylaxis: [name] is placed on a heparin infusion for anticoagulation with NSTEMI  Code Status:   Full Code  Family Communication:  Diagnosis and plan discussed with patient.  She verbalized understanding.  Questions answered.  She agrees with plan.  Further recommendations to follow as clinically indicated Disposition Plan:   Patient is from:  Home  Anticipated DC to:  Home  Anticipated DC date:  Anticipate 2 midnight or more stay in hospital to treat acute condition  Consults called:  Cardiology consulted by ER physician and will follow pt in consultation  Admission status:  Inpatient  Yevonne Aline Dominic Rhome MD [facility]  How to contact the [facility] Attending or Consulting provider [place] or covering provider during after hours [place], for this patient?   Check the care team in [facility] and look for a)  attending/consulting [facility] provider listed and b) the [facility] team listed Log into [email or web address] and use [place]'s universal password to access. If you do not have the password, please contact the hospital operator. Locate the [facility] provider you are looking for under [facility] and page to a number that you can be directly reached. If you still have difficulty reaching the provider, please page the [facility] (Director on Call) for the Hospitalists listed on amion for assistance.  01/10/2021, 1:06 AM

## 2021-01-10 NOTE — ED Notes (Signed)
Patient resting in stretcher comfortably. Eyes closed, Equal chest rise and fall. Patient alert to verbal stimuli. Call bell in reach, Stretcher in low and locked position. Side rails up x2.   

## 2021-01-11 ENCOUNTER — Encounter (HOSPITAL_COMMUNITY)
Admission: EM | Disposition: A | Discharge status: Skilled Nursing Facility | Payer: Self-pay | Source: Home / Self Care | Attending: Thoracic Surgery (Cardiothoracic Vascular Surgery)

## 2021-01-11 ENCOUNTER — Encounter (HOSPITAL_COMMUNITY): Payer: Self-pay | Admitting: Cardiology

## 2021-01-11 DIAGNOSIS — N183 Chronic kidney disease, stage 3 unspecified: Secondary | ICD-10-CM | POA: Diagnosis not present

## 2021-01-11 DIAGNOSIS — I214 Non-ST elevation (NSTEMI) myocardial infarction: Secondary | ICD-10-CM

## 2021-01-11 DIAGNOSIS — I25110 Atherosclerotic heart disease of native coronary artery with unstable angina pectoris: Secondary | ICD-10-CM

## 2021-01-11 DIAGNOSIS — I739 Peripheral vascular disease, unspecified: Secondary | ICD-10-CM

## 2021-01-11 HISTORY — PX: LEFT HEART CATH AND CORONARY ANGIOGRAPHY: CATH118249

## 2021-01-11 LAB — BASIC METABOLIC PANEL
Anion gap: 8 (ref 5–15)
BUN: 19 mg/dL (ref 8–23)
CO2: 23 mmol/L (ref 22–32)
Calcium: 9 mg/dL (ref 8.9–10.3)
Chloride: 106 mmol/L (ref 98–111)
Creatinine, Ser: 1.31 mg/dL — ABNORMAL HIGH (ref 0.44–1.00)
GFR, Estimated: 41 mL/min — ABNORMAL LOW (ref >60)
Glucose, Bld: 223 mg/dL — ABNORMAL HIGH (ref 70–99)
Potassium: 4.1 mmol/L (ref 3.5–5.1)
Sodium: 137 mmol/L (ref 135–145)

## 2021-01-11 LAB — GLUCOSE, CAPILLARY
Glucose-Capillary: 152 mg/dL — ABNORMAL HIGH (ref 70–99)
Glucose-Capillary: 141 mg/dL — ABNORMAL HIGH (ref 70–99)
Glucose-Capillary: 91 mg/dL (ref 70–99)
Glucose-Capillary: 189 mg/dL — ABNORMAL HIGH (ref 70–99)

## 2021-01-11 LAB — CBC
HCT: 39.5 % (ref 36.0–46.0)
Hemoglobin: 12.7 g/dL (ref 12.0–15.0)
MCH: 29.1 pg (ref 26.0–34.0)
MCHC: 32.2 g/dL (ref 30.0–36.0)
MCV: 90.6 fL (ref 80.0–100.0)
Platelets: 196 10*3/uL (ref 150–400)
RBC: 4.36 MIL/uL (ref 3.87–5.11)
RDW: 14 % (ref 11.5–15.5)
WBC: 6.8 10*3/uL (ref 4.0–10.5)
nRBC: 0 % (ref 0.0–0.2)

## 2021-01-11 LAB — TROPONIN I (HIGH SENSITIVITY): Troponin I (High Sensitivity): 4581 ng/L (ref <18)

## 2021-01-11 LAB — HEPARIN LEVEL (UNFRACTIONATED): Heparin Unfractionated: 0.46 IU/mL (ref 0.30–0.70)

## 2021-01-11 LAB — POCT ACTIVATED CLOTTING TIME: Activated Clotting Time: 237 seconds

## 2021-01-11 SURGERY — LEFT HEART CATH AND CORONARY ANGIOGRAPHY
Anesthesia: LOCAL

## 2021-01-11 MED ORDER — HYDRALAZINE HCL 20 MG/ML IJ SOLN: 10.0000 mg | INTRAMUSCULAR | Status: AC | PRN

## 2021-01-11 MED ORDER — VERAPAMIL HCL 2.5 MG/ML IV SOLN
INTRAVENOUS | Status: AC
  Filled 2021-01-11: qty 2

## 2021-01-11 MED ORDER — ACETAMINOPHEN 325 MG PO TABS: 650.0000 mg | ORAL_TABLET | ORAL | Status: DC | PRN

## 2021-01-11 MED ORDER — MIDAZOLAM HCL 2 MG/2ML IJ SOLN
INTRAMUSCULAR | Status: AC
  Filled 2021-01-11: qty 2

## 2021-01-11 MED ORDER — HEPARIN SODIUM (PORCINE) 1000 UNIT/ML IJ SOLN
INTRAMUSCULAR | Status: AC
  Filled 2021-01-11: qty 1

## 2021-01-11 MED ORDER — LIDOCAINE HCL (PF) 1 % IJ SOLN
INTRAMUSCULAR | Status: DC | PRN
  Administered 2021-01-11: 15 mL via INTRADERMAL

## 2021-01-11 MED ORDER — ONDANSETRON HCL 4 MG/2ML IJ SOLN
4.0000 mg | Freq: Four times a day (QID) | INTRAMUSCULAR | Status: DC | PRN
  Administered 2021-01-12: 4 mg via INTRAVENOUS
  Filled 2021-01-11: qty 2

## 2021-01-11 MED ORDER — FENTANYL CITRATE (PF) 100 MCG/2ML IJ SOLN
INTRAMUSCULAR | Status: DC | PRN
  Administered 2021-01-11: 25 ug via INTRAVENOUS

## 2021-01-11 MED ORDER — HEPARIN SODIUM (PORCINE) 1000 UNIT/ML IJ SOLN
INTRAMUSCULAR | Status: DC | PRN
  Administered 2021-01-11: 3000 [IU] via INTRAVENOUS

## 2021-01-11 MED ORDER — FENTANYL CITRATE (PF) 100 MCG/2ML IJ SOLN
INTRAMUSCULAR | Status: AC
  Filled 2021-01-11: qty 2

## 2021-01-11 MED ORDER — LIDOCAINE HCL (PF) 1 % IJ SOLN
INTRAMUSCULAR | Status: AC
  Filled 2021-01-11: qty 30

## 2021-01-11 MED ORDER — HEPARIN (PORCINE) IN NACL 1000-0.9 UT/500ML-% IV SOLN
INTRAVENOUS | Status: AC
  Filled 2021-01-11: qty 1000

## 2021-01-11 MED ORDER — SODIUM CHLORIDE 0.9 % IV SOLN
INTRAVENOUS | Status: AC | PRN
  Administered 2021-01-11: 250 mL via INTRAVENOUS

## 2021-01-11 MED ORDER — MIDAZOLAM HCL 2 MG/2ML IJ SOLN
INTRAMUSCULAR | Status: DC | PRN
  Administered 2021-01-11: 1 mg via INTRAVENOUS

## 2021-01-11 MED ORDER — HEPARIN (PORCINE) IN NACL 1000-0.9 UT/500ML-% IV SOLN
INTRAVENOUS | Status: DC | PRN
  Administered 2021-01-11 (×2): 500 mL

## 2021-01-11 MED ORDER — SODIUM CHLORIDE 0.9 % IV SOLN: 250.0000 mL | INTRAVENOUS | Status: DC | PRN

## 2021-01-11 MED ORDER — IOHEXOL 350 MG/ML SOLN
INTRAVENOUS | Status: DC | PRN
  Administered 2021-01-11: 25 mL

## 2021-01-11 MED ORDER — SODIUM CHLORIDE 0.9% FLUSH: 3.0000 mL | INTRAVENOUS | Status: DC | PRN

## 2021-01-11 MED ORDER — SODIUM CHLORIDE 0.9 % WEIGHT BASED INFUSION
1.0000 mL/kg/h | INTRAVENOUS | Status: AC
  Administered 2021-01-11: 1 mL/kg/h via INTRAVENOUS

## 2021-01-11 MED ORDER — HEPARIN (PORCINE) 25000 UT/250ML-% IV SOLN
1000.0000 [IU]/h | INTRAVENOUS | Status: DC
  Administered 2021-01-11: 750 [IU]/h via INTRAVENOUS
  Administered 2021-01-13 – 2021-01-14 (×2): 950 [IU]/h via INTRAVENOUS
  Administered 2021-01-15 – 2021-01-16 (×2): 1000 [IU]/h via INTRAVENOUS
  Filled 2021-01-11 (×5): qty 250

## 2021-01-11 MED ORDER — LABETALOL HCL 5 MG/ML IV SOLN: 10.0000 mg | INTRAVENOUS | Status: AC | PRN

## 2021-01-11 MED ORDER — SODIUM CHLORIDE 0.9% FLUSH
3.0000 mL | Freq: Two times a day (BID) | INTRAVENOUS | Status: DC
  Administered 2021-01-11 – 2021-01-15 (×4): 3 mL via INTRAVENOUS

## 2021-01-11 SURGICAL SUPPLY — 8 items
CATH INFINITI 5FR MULTPACK ANG (CATHETERS) ×1 IMPLANT
KIT HEART LEFT (KITS) ×2 IMPLANT
PACK CARDIAC CATHETERIZATION (CUSTOM PROCEDURE TRAY) ×2 IMPLANT
SHEATH PINNACLE 5F 10CM (SHEATH) ×1 IMPLANT
TRANSDUCER W/STOPCOCK (MISCELLANEOUS) ×2 IMPLANT
WIRE EMERALD 3MM-J .035X150CM (WIRE) ×1 IMPLANT
WIRE EMERALD 3MM-J .035X260CM (WIRE) ×1 IMPLANT
WIRE HI TORQ VERSACORE-J 145CM (WIRE) ×1 IMPLANT

## 2021-01-11 NOTE — Progress Notes (Signed)
Pt uncomfortable , stating she has back pain and rolling around, not complying with post cath precaution. Nicotine patch applied and tylenol given. Educated pt multiple times about the risk of bleeding. VSS. Nursing will monitor pt closely.

## 2021-01-11 NOTE — Progress Notes (Signed)
Site area:Right groin  Sheath size:5 fr Site prior to removal:Level 0 Pressure applied for:20 min Manual?:Yes Patient status during removal:Stable Post removal site status:Level 0 Post instructions given?Yes Post removal pulses: Dressing type:Gauze and Tegaderm Bedrest begins @:12:35  Comments:

## 2021-01-11 NOTE — Progress Notes (Addendum)
CSW received consult for resources for ALF placement for patient. CSW spoke with patient at bedside. All questions answered. CSW offered patient resources for Assisted Living, Rest Home and Family care home facilities. Patient accepted. Patient thanked CSW.

## 2021-01-11 NOTE — Interval H&P Note (Signed)
Cath Lab Visit (complete for each Cath Lab visit)  Clinical Evaluation Leading to the Procedure:   ACS: Yes.    Non-ACS:  yes    Anginal Classification: CCS IV  Anti-ischemic medical therapy: Maximal Therapy (2 or more classes of medications)  Non-Invasive Test Results: No non-invasive testing performed  Prior CABG: No previous CABG      History and Physical Interval Note:  01/11/2021 8:50 AM  [name]  has presented today for surgery, with the diagnosis of nstemi.  The various methods of treatment have been discussed with the patient and family. After consideration of risks, benefits and other options for treatment, the patient has consented to  Procedure(s): LEFT HEART CATH AND CORONARY ANGIOGRAPHY (N/A) as a surgical intervention.  The patient's history has been reviewed, patient examined, no change in status, stable for surgery.  I have reviewed the patient's chart and labs.  Questions were answered to the patient's satisfaction.     [name]

## 2021-01-11 NOTE — Consult Note (Addendum)
[address]       [place] [address]             [phone]        Geneve Lucarelli [place] Medical Record #[id number] Date of Birth: 1940-12-28  Referring: No ref. provider found Primary Care: [name], MD Primary Cardiologist:None  Chief Complaint:    Chief Complaint  Patient presents with   Chest Pain   Emesis    History of Present Illness:      [name] is an 80 year old female patient with a past medical history significant for an abdominal aortic aneurysm repair in 2012, diabetes mellitus type 2, hypertension, hyperlipidemia, acute CVA  in 2016, known coronary artery disease with a stent placed in 2015 in the RCA, femoral-popliteal bypass graft done in 2016, GERD, arthritis, current pack a day cigarette smoker, and chronic kidney disease stage III who presented to the emergency department with chest pain.  On 01/09/2021 she developed chest pain while walking around her house.  It was central in location and radiated to her back.  She states that it did improve with rest after some time had passed.  She denies fever, cough, nausea, vomiting, and diarrhea.  She denies any shortness of breath.  She called an ambulance when the chest pain occurred and they gave her 324 mg of aspirin but she did not wish to go to the hospital at that time.  She then called later because her chest pain was more severe and this time she did get transported to the hospital.  Initial EKG was relatively unremarkable however repeat EKG showed new ST depressions in the inferior leads.  Peak troponin was 6571.  Dr. Terrence Dupont was consulted and it was decided that she would require cardiac catheterization.  Cardiac catheterization was performed earlier today which showed 85% stenosis of the proximal RCA after her stent, ostial left main lesion of 80% stenosis, ostial LAD lesion of 90% stenosis, proximal LAD lesion of 80% stenosis, mid to distal LAD lesion of 70% stenosis, proximal  circumflex lesion of 90% stenosis, and third marginal lesion of 70% stenosis.  She also had an estimated left ventricular ejection fraction of 45 to 50% on echocardiogram.  All valves were normal in structure.  We have been consulted for possible surgical revascularization.  I spoke with the patient's daughter on the phone today.  She states that her mother does live alone in an apartment and is able to do some cooking and gets around okay.  She does have some difficulty with longer walks and they have discussed the need for an assisted living facility.  The patient feels like she needs some additional help and does not want a burden her daughter who already does a lot for her.  They have requested a case management consult for a list of assisted living facilities.  Current Activity/ Functional Status: Patient was independent with mobility/ambulation, transfers, ADL's, IADL's.   Zubrod Score: At the time of surgery this patient's most appropriate activity status/level should be described as: '[]'     0    Normal activity, no symptoms '[]'     1    Restricted in physical strenuous activity but ambulatory, able to do out light work '[x]'     2    Ambulatory and capable of self care, unable to do work activities, up and about                 more than 50%  Of the time                            '[]'     3    Only limited self care, in bed greater than 50% of waking hours '[]'     4    Completely disabled, no self care, confined to bed or chair '[]'     5    Moribund  Past Medical History:  Diagnosis Date   AAA (abdominal aortic aneurysm) (HCC)    Aneurysm ([place])    Arthritis    osteoarthritis   Constipation    Diabetes mellitus    Type 2, on Glimiperide   GERD (gastroesophageal reflux disease)    Glaucoma    Hyperlipidemia    Hypertension    Kidney stones    Myocardial infarction [facility])    Peripheral vascular disease ([place])    Pneumonia    Stroke ([place]) 01/2015   with left side weakness    TIA (transient  ischemic attack)    Urinary incontinence     Past Surgical History:  Procedure Laterality Date   ABDOMINAL AORTAGRAM N/A 03/13/2014   Procedure: ABDOMINAL Maxcine Ham;  Surgeon: Elam Dutch, MD;  Location: [facility] CATH LAB;  Service: Cardiovascular;  Laterality: N/A;   ABDOMINAL AORTIC ANEURYSM REPAIR     ABDOMINAL AORTIC ANEURYSM REPAIR  ?2012   ABDOMINAL HYSTERECTOMY     partial   BACK SURGERY     CARDIAC CATHETERIZATION     CORONARY ANGIOPLASTY  03/2015   EYE SURGERY Right    laser surgery for blood behind eye, loss of sight   FEMORAL-POPLITEAL BYPASS GRAFT Left 07/06/2014   Procedure: BYPASS GRAFT FEMORAL-POPLITEAL ARTERY;  Surgeon: Elam Dutch, MD;  Location: [place];  Service: Vascular;  Laterality: Left;   HERNIA REPAIR     JOINT REPLACEMENT     LEFT HEART CATHETERIZATION WITH CORONARY ANGIOGRAM N/A 04/12/2014   Procedure: LEFT HEART CATHETERIZATION WITH CORONARY ANGIOGRAM;  Surgeon: Clent Demark, MD;  Location: [place] CATH LAB;  Service: Cardiovascular;  Laterality: N/A;   PERCUTANEOUS CORONARY STENT INTERVENTION (PCI-S)  04/12/2014   Procedure: PERCUTANEOUS CORONARY STENT INTERVENTION (PCI-S);  Surgeon: Clent Demark, MD;  Location: [facility] CATH LAB;  Service: Cardiovascular;;  prox and mid RCA   PERIPHERAL VASCULAR INTERVENTION  06/25/2019   Procedure: PERIPHERAL VASCULAR INTERVENTION;  Surgeon: Elam Dutch, MD;  Location: [place] CV LAB;  Service: Cardiovascular;;  Lt Renal and Rt Renal   RENAL ANGIOGRAPHY Bilateral 06/25/2019   Procedure: RENAL ANGIOGRAPHY;  Surgeon: Elam Dutch, MD;  Location: [place] CV LAB;  Service: Cardiovascular;  Laterality: Bilateral;   TOTAL HIP ARTHROPLASTY Left 05/18/2015   TOTAL HIP ARTHROPLASTY Left 05/18/2015   Procedure: LEFT TOTAL HIP ARTHROPLASTY ANTERIOR APPROACH;  Surgeon: Mcarthur Rossetti, MD;  Location: [place];  Service: Orthopedics;  Laterality: Left;   TOTAL KNEE ARTHROPLASTY Left ~ 2003   TUBAL LIGATION      uterine tumor     VENTRAL HERNIA REPAIR      Social History   Tobacco Use  Smoking Status Every Day   Packs/day: 0.50   Years: 40.00   Pack years: 20.00   Types: Cigarettes  Smokeless Tobacco Never    Social History   Substance and Sexual Activity  Alcohol Use No   Alcohol/week: 0.0 standard drinks     Allergies  Allergen Reactions   Keflex [Cephalexin] Shortness Of Breath and Other (  See Comments)    Chest pain, headache   Lipitor [Atorvastatin] Shortness Of Breath and Other (See Comments)    Severe pain/cramping in legs- "I cannot walk, talk, or breathe" (pt is currently taking 20 mg daily 10/11/19)   Codeine Other (See Comments)    Disrupted patient's equilibrium- "Made my world flip upside down"   Hydrochlorothiazide Other (See Comments)    Other reaction(s): lightheaded, nausea   Hydrocodone-Acetaminophen Other (See Comments)    Other reaction(s): "get crazy"   Metformin Hcl Other (See Comments)    Other reaction(s): upset stomach   Tramadol Hcl Other (See Comments)    Other reaction(s): "get crazy"   Azor [Amlodipine-Olmesartan] Swelling, Palpitations and Rash    Amlodipine alone resumed 12/2013, tolerating   Lisinopril Cough   Penicillins Dermatitis and Other (See Comments)    Did it involve swelling of the face/tongue/throat, SOB, or low BP? No, just worsened eczema Did it involve sudden or severe rash/hives, skin peeling, or any reaction on the inside of your mouth or nose? No Did you need to seek medical attention at a hospital or doctor's office? No When did it last happen? "More than 10 years ago" If all above answers are "NO", may proceed with cephalosporin use.     Current Facility-Administered Medications  Medication Dose Route Frequency Provider Last Rate Last Admin   0.9 %  sodium chloride infusion  250 mL Intravenous PRN [name], MD       0.9% sodium chloride infusion  1 mL/kg/hr Intravenous Continuous [name], MD 74 mL/hr at 01/11/21  0509 1 mL/kg/hr at 01/11/21 0509   [MAR Hold] acetaminophen (TYLENOL) tablet 650 mg  650 mg Oral Q6H PRN Chotiner, Yevonne Aline, MD   650 mg at 01/10/21 1222   Or   [MAR Hold] acetaminophen (TYLENOL) suppository 650 mg  650 mg Rectal Q6H PRN Chotiner, Yevonne Aline, MD       [MAR Hold] albuterol (PROVENTIL) (2.5 MG/3ML) 0.083% nebulizer solution 2.5 mg  2.5 mg Nebulization Q2H PRN Chotiner, Yevonne Aline, MD       [MAR Hold] amLODipine (NORVASC) tablet 5 mg  5 mg Oral Daily Chotiner, Yevonne Aline, MD   5 mg at 01/10/21 0839   [MAR Hold] aspirin EC tablet 81 mg  81 mg Oral Daily Chotiner, Yevonne Aline, MD   81 mg at 01/10/21 0839   [MAR Hold] atorvastatin (LIPITOR) tablet 80 mg  80 mg Oral Daily [name], MD   80 mg at 01/10/21 0934   [MAR Hold] clopidogrel (PLAVIX) tablet 75 mg  75 mg Oral Daily [name], MD   75 mg at 01/11/21 0406   heparin ADULT infusion 100 units/mL (25000 units/267m)  750 Units/hr Intravenous Continuous Elgergawy, DSilver Huguenin MD   Stopped at 01/11/21 0906   [MAR Hold] insulin aspart (novoLOG) injection 0-5 Units  0-5 Units Subcutaneous QHS Chotiner, BYevonne Aline MD   2 Units at 01/10/21 2025   [MAR Hold] insulin aspart (novoLOG) injection 0-9 Units  0-9 Units Subcutaneous TID WC Chotiner, BYevonne Aline MD   2 Units at 01/10/21 1748   lactated ringers infusion   Intravenous Continuous Elgergawy, DSilver Huguenin MD   Stopped at 01/11/21 0906   [MAR Hold] metoprolol tartrate (LOPRESSOR) tablet 25 mg  25 mg Oral BID HCharolette Forward MD   25 mg at 01/10/21 2129   [MAR Hold] nicotine (NICODERM CQ - dosed in mg/24 hours) patch 14 mg  14 mg Transdermal Daily Chotiner, BYevonne Aline MD  14 mg at 01/10/21 0837   [MAR Hold] nitroGLYCERIN (NITROSTAT) SL tablet 0.4 mg  0.4 mg Sublingual Q5 min PRN [name], MD   0.4 mg at 01/10/21 1941   [MAR Hold] nitroGLYCERIN 50 mg in dextrose 5 % 250 mL (0.2 mg/mL) infusion  0-200 mcg/min Intravenous Titrated [name], MD   Stopped at 01/11/21 0905   [MAR Hold]  pantoprazole (PROTONIX) injection 40 mg  40 mg Intravenous Q24H Elgergawy, Silver Huguenin, MD   40 mg at 01/10/21 0836   [MAR Hold] sodium chloride flush (NS) 0.9 % injection 3 mL  3 mL Intravenous Q12H [name], MD   3 mL at 01/10/21 0935   sodium chloride flush (NS) 0.9 % injection 3 mL  3 mL Intravenous PRN [name], MD        Facility-Administered Medications Prior to Admission  Medication Dose Route Frequency Provider Last Rate Last Admin   promethazine (PHENERGAN) injection 12.5 mg  12.5 mg Intravenous Q6H PRN Elam Dutch, MD       Medications Prior to Admission  Medication Sig Dispense Refill Last Dose   acetaminophen (TYLENOL) 650 MG CR tablet Take 650 mg by mouth at bedtime as needed for pain.    unk   albuterol (PROVENTIL HFA;VENTOLIN HFA) 108 (90 Base) MCG/ACT inhaler Inhale 2 puffs into the lungs every 4 (four) hours as needed for wheezing or shortness of breath (or coughing). 1 Inhaler 0 unk   amLODipine (NORVASC) 5 MG tablet Take 5 mg by mouth daily.   01/09/2021   aspirin EC 81 MG tablet Take 1 tablet (81 mg total) by mouth daily. (Patient taking differently: Take 243 mg by mouth daily as needed (chest pain).) 150 tablet 2 01/10/2021   atorvastatin (LIPITOR) 40 MG tablet Take 1 tablet (40 mg total) by mouth daily. 30 tablet 1 01/09/2021   blood glucose meter kit and supplies KIT Dispense based on patient and insurance preference. Use up to four times daily as directed. (FOR ICD-9 250.00, 250.01). (Patient taking differently: Inject 1 each into the skin See admin instructions. Dispense based on patient and insurance preference. Use up to four times daily as directed. (FOR ICD-9 250.00, 250.01).) 1 each 0 unk   Blood Glucose Monitoring Suppl (BAYER BREEZE 2 SYSTEM) w/Device KIT 1 each by Does not apply route 2 (two) times daily. 1 kit 0 unk   clopidogrel (PLAVIX) 75 MG tablet Take 75 mg by mouth daily.   01/09/2021 at 8am   esomeprazole (NEXIUM) 40 MG capsule Take 40 mg by  mouth daily as needed (acid reflux).    unk   glimepiride (AMARYL) 2 MG tablet Take 1 tablet (2 mg total) by mouth every morning. 30 tablet 1 01/09/2021   isosorbide mononitrate (IMDUR) 60 MG 24 hr tablet Take 60 mg by mouth daily.  3 01/09/2021   metoprolol succinate (TOPROL-XL) 100 MG 24 hr tablet Take 100 mg by mouth daily.   01/09/2021 at 8am   nitroGLYCERIN (NITROSTAT) 0.4 MG SL tablet Place 0.4 mg under the tongue every 5 (five) minutes as needed for chest pain.   unk   busPIRone (BUSPAR) 5 MG tablet Take 1 tablet (5 mg total) by mouth 2 (two) times daily. (Patient not taking: No sig reported) 60 tablet 1 Not Taking   spironolactone (ALDACTONE) 25 MG tablet Take 25 mg by mouth daily. (Patient not taking: No sig reported)   Not Taking    Family History  Problem Relation Age of Onset  Diabetes Mother    Stroke Mother    Hypertension Father    Stroke Father    Hyperlipidemia Sister    Hypertension Sister    Aneurysm Sister    Hyperlipidemia Brother    Hypertension Brother      Review of Systems:   Review of Systems  Constitutional:  Positive for malaise/fatigue. Negative for fever.  Respiratory:  Negative for shortness of breath.   Cardiovascular:  Positive for chest pain. Negative for leg swelling.  Gastrointestinal:  Negative for abdominal pain, heartburn, nausea and vomiting.  Pertinent items are noted in HPI.    Physical Exam: BP (!) 162/81   Pulse (!) 56   Temp 98.1 F (36.7 C) (Oral)   Resp (!) 22   Ht '5\' 7"'  (1.702 m)   Wt 74 kg   SpO2 100%   BMI 25.55 kg/m    General appearance: alert, cooperative, and no distress Resp: clear to auscultation bilaterally Cardio: regular rate and rhythm, S1, S2 normal, no murmur, click, rub or gallop GI: soft, non-tender; bowel sounds normal; no masses,  no organomegaly Extremities: extremities normal, atraumatic, no cyanosis or edema Neurologic: Grossly normal  Diagnostic Studies & Laboratory data:     Recent Radiology  Findings:   CARDIAC CATHETERIZATION  Result Date: 01/11/2021   Prox RCA lesion is 85% stenosed.   Ost LM lesion is 80% stenosed.   Ost LAD lesion is 90% stenosed.   Prox LAD lesion is 80% stenosed.   Mid LAD to Dist LAD lesion is 70% stenosed.   Prox Cx lesion is 90% stenosed.   2nd Mrg lesion is 60% stenosed.   3rd Mrg lesion is 70% stenosed.   Previously placed Prox RCA to Mid RCA stent (unknown type) is  widely patent.   DG Chest Portable 1 View  Result Date: 01/09/2021 CLINICAL DATA:  Chest pain EXAM: PORTABLE CHEST 1 VIEW COMPARISON:  09/20/2020 FINDINGS: Cardiac shadow is stable. The lungs are well aerated bilaterally. No focal infiltrate or effusion is seen. No bony abnormality is noted. IMPRESSION: No active disease. Electronically Signed   By: Inez Catalina M.D.   On: 01/09/2021 22:59   ECHOCARDIOGRAM COMPLETE  Result Date: 01/10/2021    ECHOCARDIOGRAM REPORT   Patient Name:   [name] Date of Exam: 01/10/2021 Medical Rec #:  [id number]      Height:       67.0 in Accession #:    [id number]     Weight:       163.1 lb Date of Birth:  Oct 19, 1940       BSA:          1.855 m Patient Age:    1 years       BP:           117/58 mmHg Patient Gender: F              HR:           65 bpm. Exam Location:  Inpatient Procedure: 2D Echo, Cardiac Doppler and Color Doppler Indications:    NSTEMI  History:        Patient has prior history of Echocardiogram examinations, most                 recent 10/12/2019. Acute MI, Previous Myocardial Infarction and                 CAD, Stroke and PVD; Risk Factors:Hypertension, Dyslipidemia,  Current Smoker and Diabetes.  Sonographer:    Dustin Flock RDCS Referring Phys: [place]  1. Left ventricular ejection fraction, by estimation, is 45 to 50%. The left ventricle has low normal function. The left ventricle demonstrates regional wall motion abnormalities (see scoring diagram/findings for description). There is mild concentric left  ventricular hypertrophy. Left ventricular diastolic parameters are consistent with Grade I diastolic dysfunction (impaired relaxation).  2. Right ventricular systolic function is normal. The right ventricular size is normal. There is mildly elevated pulmonary artery systolic pressure.  3. The mitral valve is normal in structure. Trivial mitral valve regurgitation.  4. The aortic valve is tricuspid. Aortic valve regurgitation is not visualized. Mild aortic valve sclerosis is present, with no evidence of aortic valve stenosis.  5. The inferior vena cava is normal in size with <50% respiratory variability, suggesting right atrial pressure of 8 mmHg. FINDINGS  Left Ventricle: Left ventricular ejection fraction, by estimation, is 45 to 50%. The left ventricle has low normal function. The left ventricle demonstrates regional wall motion abnormalities. Severe hypokinesis of the left ventricular, apical apical segment. The left ventricular internal cavity size was normal in size. There is mild concentric left ventricular hypertrophy. Left ventricular diastolic parameters are consistent with Grade I diastolic dysfunction (impaired relaxation). Right Ventricle: The right ventricular size is normal. No increase in right ventricular wall thickness. Right ventricular systolic function is normal. There is mildly elevated pulmonary artery systolic pressure. The tricuspid regurgitant velocity is 3.24  m/s, and with an assumed right atrial pressure of 3 mmHg, the estimated right ventricular systolic pressure is 44.3 mmHg. Left Atrium: Left atrial size was normal in size. Right Atrium: Right atrial size was normal in size. Pericardium: There is no evidence of pericardial effusion. Mitral Valve: The mitral valve is normal in structure. There is mild thickening of the mitral valve leaflet(s). Trivial mitral valve regurgitation. Tricuspid Valve: The tricuspid valve is normal in structure. Tricuspid valve regurgitation is mild. Aortic  Valve: The aortic valve is tricuspid. Aortic valve regurgitation is not visualized. Mild aortic valve sclerosis is present, with no evidence of aortic valve stenosis. Pulmonic Valve: The pulmonic valve was normal in structure. Pulmonic valve regurgitation is not visualized. Aorta: The aortic root is normal in size and structure. Venous: The inferior vena cava is normal in size with less than 50% respiratory variability, suggesting right atrial pressure of 8 mmHg. IAS/Shunts: No atrial level shunt detected by color flow Doppler.  LEFT VENTRICLE PLAX 2D LVIDd:         4.00 cm  Diastology LVIDs:         2.90 cm  LV e' medial:    3.05 cm/s LV PW:         1.10 cm  LV E/e' medial:  17.2 LV IVS:        1.10 cm  LV e' lateral:   4.35 cm/s LVOT diam:     1.80 cm  LV E/e' lateral: 12.0 LV SV:         42 LV SV Index:   23 LVOT Area:     2.54 cm  RIGHT VENTRICLE RV Basal diam:  2.80 cm RV S prime:     10.70 cm/s TAPSE (M-mode): 2.1 cm LEFT ATRIUM             Index       RIGHT ATRIUM          Index LA diam:  3.10 cm 1.67 cm/m  RA Area:     9.52 cm LA Vol (A2C):   26.5 ml 14.29 ml/m RA Volume:   18.90 ml 10.19 ml/m LA Vol (A4C):   24.8 ml 13.37 ml/m LA Biplane Vol: 25.8 ml 13.91 ml/m  AORTIC VALVE LVOT Vmax:   85.00 cm/s LVOT Vmean:  51.000 cm/s LVOT VTI:    0.166 m  AORTA Ao Root diam: 2.70 cm MITRAL VALVE               TRICUSPID VALVE MV Area (PHT): 3.70 cm    TR Peak grad:   42.0 mmHg MV Decel Time: 205 msec    TR Vmax:        324.00 cm/s MV E velocity: 52.40 cm/s MV A velocity: 99.90 cm/s  SHUNTS MV E/A ratio:  0.52        Systemic VTI:  0.17 m                            Systemic Diam: 1.80 cm [name] MD Electronically signed by [name] MD Signature Date/Time: 01/10/2021/4:56:48 PM    Final      I have independently reviewed the above radiologic studies and discussed with the patient   Recent Lab Findings: Lab Results  Component Value Date   WBC 6.8 01/11/2021   HGB 12.7 01/11/2021   HCT 39.5  01/11/2021   PLT 196 01/11/2021   GLUCOSE 223 (H) 01/11/2021   CHOL 247 (H) 01/10/2021   TRIG 139 01/10/2021   HDL 39 (L) 01/10/2021   LDLCALC 180 (H) 01/10/2021   ALT 10 10/14/2019   AST 17 10/14/2019   NA 137 01/11/2021   K 4.1 01/11/2021   CL 106 01/11/2021   CREATININE 1.31 (H) 01/11/2021   BUN 19 01/11/2021   CO2 23 01/11/2021   INR 0.9 10/11/2019   HGBA1C 9.7 (H) 01/10/2021      Assessment / Plan:      1.  Acute NSTEMI with known coronary artery disease and PCI to the RCA in November 2015-continue medical management 2.  Hypertension-uncontrolled at the moment post cath 3.  Hyperlipidemia-continue statin therapy 4.  History of CVA-no significant deficits 5.  Peripheral vascular disease with history of femoral-popliteal bypass in 2016 6.  History of abdominal aortic aneurysm repair in 2015 7.  Diabetes mellitus type 2-uncontrolled 8.  Chronic kidney disease stage IIIb, recent creatinine 1.31 9.  Tobacco use, current pack a day every day smoker-cessation advised 10. GERD 11. Arthritis 12.  Repair of abdominal aortic aneurysm in 2012  Plan: Coronary artery bypass grafting explained to the patient in Cath Lab holding and also to the daughter over the telephone.  All questions were answered to their satisfaction.  Patient would be relatively high risk due to her advanced age and the comorbidities listed above.  I did put a consult into case management per the daughter's request for setting up an assisted living facility for her mother.  They would like to discuss surgery with Dr. Kipp Brood before making a final decision.  Dr. Kipp Brood to review her cardiac catheterization and echocardiogram and ultimately determine candidacy.    I  spent 40 minutes counseling the patient face to face.   Nicholes Rough, PA-C 01/11/2021 11:19 AM  Agree with above.  This is an 80 year old female is admitted after an NSTEMI.  Left heart cath shows severe three-vessel coronary artery disease.   She does appear  to have good distal targets and runoff in all territories.  Her LV function is down slightly but she has preserved RV function.  We discussed the potential for surgical revascularization but the patient was adamant that she did not want open heart surgery.  She states that she lives alone and does not have much support since her 2 sons have died, and she does not have a good relationship with her daughters.  She simply states that she has had enough surgery and does not want any more given her age.  Since she is refusing surgery would recommend ongoing medical therapy.  Xayla Puzio Bary Leriche

## 2021-01-11 NOTE — Progress Notes (Signed)
PROGRESS NOTE    [name]  HYW:[id number] DOB: 22-Jun-1940 DOA: 01/09/2021 PCP: [name], MD    Chief Complaint  Patient presents with   Chest Pain   Emesis    Brief Narrative:   [name] is an 80 year old female patient with a past medical history significant for an abdominal aortic aneurysm repair in 2012, diabetes mellitus type 2, hypertension, hyperlipidemia, acute CVA  in 2016, known coronary artery disease with a stent placed in 2015 in the RCA, femoral-popliteal bypass graft done in 2016, GERD, arthritis, current pack a day cigarette smoker, and chronic kidney disease stage III who presented to the emergency department with chest pain, her work-up significant for NSTEMI, she went for cardiac cath 8/16, which was significant for multivessel disease, CT surgery consulted regarding CABG. Assessment & Plan:   Principal Problem:   NSTEMI (non-ST elevated myocardial infarction) ([name]) Active Problems:   Hyperlipidemia   Hypertension   DM (diabetes mellitus) type II uncontrolled, periph vascular disorder (HCC)   CKD (chronic kidney disease) stage 3, GFR 30-59 ml/min (HCC)   CAD (coronary artery disease)   NSTEMI (non-ST elevated myocardial infarction)  -Since findings significant for NSTEMI, this postcardiac cath 8/16, significant for multivessel disease, CT surgery consulted regarding CABG. -For now continue with medical management including aspirin, beta-blockers and heparin gtt.      Hyperlipidemia Pt has allergy to lipitor documented. Will try pravachol to see if tolerates better.      Hypertension Continue norvasc, metoprolol, spironolactone, Imdur     DM (diabetes mellitus) type II uncontrolled, periph vascular disorder -, Poorly controlled at 9.7, but this is better than previous value at 12 last year. -For now continue with insulin sliding scale    CKD (chronic kidney disease) stage 3 B Stable. Monitor electrolytes and renal function in am with labs.       CAD (coronary artery disease) Chronic. Pt had cardiac stent placed 3 years ago she states and takes her aspirin daily.       Tobacco use Nicotine patch to prevent withdrawls. Pt encouraged to quit tobacco use for her health.     DVT prophylaxis: on heparin GTT Code Status: Full Family Communication: Discussed with the patient, none at bedside Disposition:   Status is: Inpatient  Remains inpatient appropriate because:IV treatments appropriate due to intensity of illness or inability to take PO  Dispo: The patient is from: Home              Anticipated d/c is to: Home              Patient currently is not medically stable to d/c.   Difficult to place patient No       Consultants:  Cardiology CT surgery   Subjective:  Currently denies any chest pain or shortness of breath.  Objective: Vitals:   01/11/21 1350 01/11/21 1434 01/11/21 1449 01/11/21 1534  BP: (!) 161/87 (!) 157/66 (!) 153/88 139/67  Pulse: (!) 59 70 61 (!) 59  Resp: 18 15 (!) 21 16  Temp:    97.7 F (36.5 C)  TempSrc:    Oral  SpO2: 95% 97% 98% 97%  Weight:      Height:        Intake/Output Summary (Last 24 hours) at 01/11/2021 1618 Last data filed at 01/11/2021 1558 Gross per 24 hour  Intake 2586.94 ml  Output 1750 ml  Net 836.94 ml   Filed Weights   01/10/21 0900 01/10/21 2003  Weight: 74 kg  74 kg    Examination: Awake Alert, Oriented X 3, No new F.N deficits, Normal affect Symmetrical Chest wall movement, Good air movement bilaterally, CTAB RRR,No Gallops,Rubs or new Murmurs, No Parasternal Heave +ve B.Sounds, Abd Soft, No tenderness, No rebound - guarding or rigidity. No Cyanosis, Clubbing or edema, No new Rash or bruise      Data Reviewed: I have personally reviewed following labs and imaging studies  CBC: Recent Labs  Lab 01/09/21 2230 01/10/21 0447 01/11/21 0332  WBC 8.0 7.9 6.8  HGB 14.9 13.7 12.7  HCT 45.1 42.5 39.5  MCV 90.0 90.4 90.6  PLT 188 235 196    Basic  Metabolic Panel: Recent Labs  Lab 01/09/21 2230 01/10/21 0939 01/11/21 0332  NA 138 137 137  K 4.6 4.2 4.1  CL 104 106 106  CO2 25 23 23   GLUCOSE 245* 171* 223*  BUN 23 23 19   CREATININE 1.54* 1.36* 1.31*  CALCIUM 9.9 9.1 9.0    GFR: Estimated Creatinine Clearance: 35.4 mL/min (A) (by C-G formula based on SCr of 1.31 mg/dL (H)).  Liver Function Tests: No results for input(s): AST, ALT, ALKPHOS, BILITOT, PROT, ALBUMIN in the last 168 hours.  CBG: Recent Labs  Lab 01/10/21 1743 01/10/21 2013 01/11/21 0731 01/11/21 1305 01/11/21 1611  GLUCAP 158* 217* 152* 141* 91     Recent Results (from the past 240 hour(s))  Resp Panel by RT-PCR (Flu A&B, Covid) Nasopharyngeal Swab     Status: None   Collection Time: 01/09/21 12:18 AM   Specimen: Nasopharyngeal Swab; Nasopharyngeal(NP) swabs in vial transport medium  Result Value Ref Range Status   SARS Coronavirus 2 by RT PCR NEGATIVE NEGATIVE Final    Comment: (NOTE) SARS-CoV-2 target nucleic acids are NOT DETECTED.  The SARS-CoV-2 RNA is generally detectable in upper respiratory specimens during the acute phase of infection. The lowest concentration of SARS-CoV-2 viral copies this assay can detect is 138 copies/mL. A negative result does not preclude SARS-Cov-2 infection and should not be used as the sole basis for treatment or other patient management decisions. A negative result may occur with  improper specimen collection/handling, submission of specimen other than nasopharyngeal swab, presence of viral mutation(s) within the areas targeted by this assay, and inadequate number of viral copies(<138 copies/mL). A negative result must be combined with clinical observations, patient history, and epidemiological information. The expected result is Negative.  Fact Sheet for Patients:  EntrepreneurPulse.com.au  Fact Sheet for Healthcare Providers:  IncredibleEmployment.be  This test is no  t yet approved or cleared by the [place] FDA and  has been authorized for detection and/or diagnosis of SARS-CoV-2 by FDA under an Emergency Use Authorization (EUA). This EUA will remain  in effect (meaning this test can be used) for the duration of the COVID-19 declaration under Section 564(b)(1) of the Act, 21 U.S.C.section 360bbb-3(b)(1), unless the authorization is terminated  or revoked sooner.       Influenza A by PCR NEGATIVE NEGATIVE Final   Influenza B by PCR NEGATIVE NEGATIVE Final    Comment: (NOTE) The Xpert Xpress SARS-CoV-2/FLU/RSV plus assay is intended as an aid in the diagnosis of influenza from Nasopharyngeal swab specimens and should not be used as a sole basis for treatment. Nasal washings and aspirates are unacceptable for Xpert Xpress SARS-CoV-2/FLU/RSV testing.  Fact Sheet for Patients: EntrepreneurPulse.com.au  Fact Sheet for Healthcare Providers: IncredibleEmployment.be  This test is not yet approved or cleared by the [place] and has been authorized for  detection and/or diagnosis of SARS-CoV-2 by FDA under an Emergency Use Authorization (EUA). This EUA will remain in effect (meaning this test can be used) for the duration of the COVID-19 declaration under Section 564(b)(1) of the Act, 21 U.S.C. section 360bbb-3(b)(1), unless the authorization is terminated or revoked.  Performed at [facility], [place] [address] [place], [place] [address]          Radiology Studies: CARDIAC CATHETERIZATION  Result Date: 01/11/2021   Prox RCA lesion is 85% stenosed.   Ost LM lesion is 80% stenosed.   Ost LAD lesion is 90% stenosed.   Prox LAD lesion is 80% stenosed.   Mid LAD to Dist LAD lesion is 70% stenosed.   Prox Cx lesion is 90% stenosed.   2nd Mrg lesion is 60% stenosed.   3rd Mrg lesion is 70% stenosed.   Previously placed Prox RCA to Mid RCA stent (unknown type) is  widely patent.   DG Chest  Portable 1 View  Result Date: 01/09/2021 CLINICAL DATA:  Chest pain EXAM: PORTABLE CHEST 1 VIEW COMPARISON:  09/20/2020 FINDINGS: Cardiac shadow is stable. The lungs are well aerated bilaterally. No focal infiltrate or effusion is seen. No bony abnormality is noted. IMPRESSION: No active disease. Electronically Signed   By: Inez Catalina M.D.   On: 01/09/2021 22:59   ECHOCARDIOGRAM COMPLETE  Result Date: 01/10/2021    ECHOCARDIOGRAM REPORT   Patient Name:   [name] Date of Exam: 01/10/2021 Medical Rec #:  [id number]      Height:       67.0 in Accession #:    [id number]     Weight:       163.1 lb Date of Birth:  [birth year]       BSA:          1.855 m Patient Age:    23 years       BP:           117/58 mmHg Patient Gender: F              HR:           65 bpm. Exam Location:  Inpatient Procedure: 2D Echo, Cardiac Doppler and Color Doppler Indications:    NSTEMI  History:        Patient has prior history of Echocardiogram examinations, most                 recent 10/12/2019. Acute MI, Previous Myocardial Infarction and                 CAD, Stroke and PVD; Risk Factors:Hypertension, Dyslipidemia,                 Current Smoker and Diabetes.  Sonographer:    Dustin Flock RDCS Referring Phys: [facility]  1. Left ventricular ejection fraction, by estimation, is 45 to 50%. The left ventricle has low normal function. The left ventricle demonstrates regional wall motion abnormalities (see scoring diagram/findings for description). There is mild concentric left ventricular hypertrophy. Left ventricular diastolic parameters are consistent with Grade I diastolic dysfunction (impaired relaxation).  2. Right ventricular systolic function is normal. The right ventricular size is normal. There is mildly elevated pulmonary artery systolic pressure.  3. The mitral valve is normal in structure. Trivial mitral valve regurgitation.  4. The aortic valve is tricuspid. Aortic valve regurgitation is not  visualized. Mild aortic valve sclerosis is present, with no evidence of aortic valve stenosis.  5. The inferior vena cava is normal in size with <50% respiratory variability, suggesting right atrial pressure of 8 mmHg. FINDINGS  Left Ventricle: Left ventricular ejection fraction, by estimation, is 45 to 50%. The left ventricle has low normal function. The left ventricle demonstrates regional wall motion abnormalities. Severe hypokinesis of the left ventricular, apical apical segment. The left ventricular internal cavity size was normal in size. There is mild concentric left ventricular hypertrophy. Left ventricular diastolic parameters are consistent with Grade I diastolic dysfunction (impaired relaxation). Right Ventricle: The right ventricular size is normal. No increase in right ventricular wall thickness. Right ventricular systolic function is normal. There is mildly elevated pulmonary artery systolic pressure. The tricuspid regurgitant velocity is 3.24  m/s, and with an assumed right atrial pressure of 3 mmHg, the estimated right ventricular systolic pressure is 62.7 mmHg. Left Atrium: Left atrial size was normal in size. Right Atrium: Right atrial size was normal in size. Pericardium: There is no evidence of pericardial effusion. Mitral Valve: The mitral valve is normal in structure. There is mild thickening of the mitral valve leaflet(s). Trivial mitral valve regurgitation. Tricuspid Valve: The tricuspid valve is normal in structure. Tricuspid valve regurgitation is mild. Aortic Valve: The aortic valve is tricuspid. Aortic valve regurgitation is not visualized. Mild aortic valve sclerosis is present, with no evidence of aortic valve stenosis. Pulmonic Valve: The pulmonic valve was normal in structure. Pulmonic valve regurgitation is not visualized. Aorta: The aortic root is normal in size and structure. Venous: The inferior vena cava is normal in size with less than 50% respiratory variability, suggesting  right atrial pressure of 8 mmHg. IAS/Shunts: No atrial level shunt detected by color flow Doppler.  LEFT VENTRICLE PLAX 2D LVIDd:         4.00 cm  Diastology LVIDs:         2.90 cm  LV e' medial:    3.05 cm/s LV PW:         1.10 cm  LV E/e' medial:  17.2 LV IVS:        1.10 cm  LV e' lateral:   4.35 cm/s LVOT diam:     1.80 cm  LV E/e' lateral: 12.0 LV SV:         42 LV SV Index:   23 LVOT Area:     2.54 cm  RIGHT VENTRICLE RV Basal diam:  2.80 cm RV S prime:     10.70 cm/s TAPSE (M-mode): 2.1 cm LEFT ATRIUM             Index       RIGHT ATRIUM          Index LA diam:        3.10 cm 1.67 cm/m  RA Area:     9.52 cm LA Vol (A2C):   26.5 ml 14.29 ml/m RA Volume:   18.90 ml 10.19 ml/m LA Vol (A4C):   24.8 ml 13.37 ml/m LA Biplane Vol: 25.8 ml 13.91 ml/m  AORTIC VALVE LVOT Vmax:   85.00 cm/s LVOT Vmean:  51.000 cm/s LVOT VTI:    0.166 m  AORTA Ao Root diam: 2.70 cm MITRAL VALVE               TRICUSPID VALVE MV Area (PHT): 3.70 cm    TR Peak grad:   42.0 mmHg MV Decel Time: 205 msec    TR Vmax:        324.00 cm/s MV E velocity: 52.40 cm/s MV A  velocity: 99.90 cm/s  SHUNTS MV E/A ratio:  0.52        Systemic VTI:  0.17 m                            Systemic Diam: 1.80 cm [name] MD Electronically signed by [name] MD Signature Date/Time: 01/10/2021/4:56:48 PM    Final         Scheduled Meds:  amLODipine  5 mg Oral Daily   aspirin EC  81 mg Oral Daily   atorvastatin  80 mg Oral Daily   insulin aspart  0-5 Units Subcutaneous QHS   insulin aspart  0-9 Units Subcutaneous TID WC   metoprolol tartrate  25 mg Oral BID   nicotine  14 mg Transdermal Daily   pantoprazole (PROTONIX) IV  40 mg Intravenous Q24H   sodium chloride flush  3 mL Intravenous Q12H   sodium chloride flush  3 mL Intravenous Q12H   Continuous Infusions:  sodium chloride     sodium chloride 1 mL/kg/hr (01/11/21 1458)   heparin     lactated ringers Stopped (01/11/21 0906)   nitroGLYCERIN Stopped (01/11/21 0848)     LOS:  1 day     Phillips Climes, MD [facility]   To contact the attending provider between 7A-7P or the covering provider during after hours 7P-7A, please log into the web site [email or web address] and access using universal [place] password for that web site. If you do not have the password, please call the hospital operator.  01/11/2021, 4:18 PM

## 2021-01-11 NOTE — Progress Notes (Signed)
ANTICOAGULATION CONSULT NOTE - Follow Up Consult  Pharmacy Consult for Heparin Indication: chest pain/ACS  Allergies  Allergen Reactions   Keflex [Cephalexin] Shortness Of Breath and Other (See Comments)    Chest pain, headache   Lipitor [Atorvastatin] Shortness Of Breath and Other (See Comments)    Severe pain/cramping in legs- "I cannot walk, talk, or breathe" (pt is currently taking 20 mg daily 10/11/19)   Codeine Other (See Comments)    Disrupted patient's equilibrium- "Made my world flip upside down"   Hydrochlorothiazide Other (See Comments)    Other reaction(s): lightheaded, nausea   Hydrocodone-Acetaminophen Other (See Comments)    Other reaction(s): "get crazy"   Metformin Hcl Other (See Comments)    Other reaction(s): upset stomach   Tramadol Hcl Other (See Comments)    Other reaction(s): "get crazy"   Azor [Amlodipine-Olmesartan] Swelling, Palpitations and Rash    Amlodipine alone resumed 12/2013, tolerating   Lisinopril Cough   Penicillins Dermatitis and Other (See Comments)    Did it involve swelling of the face/tongue/throat, SOB, or low BP? No, just worsened eczema Did it involve sudden or severe rash/hives, skin peeling, or any reaction on the inside of your mouth or nose? No Did you need to seek medical attention at a hospital or doctor's office? No When did it last happen? "More than 10 years ago" If all above answers are "NO", may proceed with cephalosporin use.       Vital Signs: Temp: 97.7 F (36.5 C) (08/16 1257) Temp Source: Oral (08/16 1257) BP: 160/80 (08/16 1339) Pulse Rate: 65 (08/16 1339)  Labs: Recent Labs    01/09/21 2230 01/10/21 0030 01/10/21 0447 01/10/21 0820 01/10/21 0939 01/10/21 1110 01/10/21 1926 01/10/21 1937 01/11/21 0332  HGB 14.9  --  13.7  --   --   --   --   --  12.7  HCT 45.1  --  42.5  --   --   --   --   --  39.5  PLT 188  --  235  --   --   --   --   --  196  HEPARINUNFRC  --   --   --  0.63  --   --  0.47  --   0.46  CREATININE 1.54*  --   --   --  1.36*  --   --   --  1.31*  TROPONINIHS 147*   < >  --   --  6,501* 6,571*  --  5,766* 4,581*   < > = values in this interval not displayed.    Assessment: 80 y/o F with chest pain noe s/p cath with multivessel CAD. Plans for possible CABG.  Heparin to restart 8 hours post sheath removal.    Goal of Therapy:  Heparin level 0.3-0.7 units/ml Monitor platelets by anticoagulation protocol: Yes   Plan:   -Restart heparin at 750 units/hr at 9pm -Monitor daily HL, CBC  Hildred Laser, PharmD Clinical Pharmacist **Pharmacist phone directory can now be found on [email or web address] (PW [email or web address]).  Listed under [place].

## 2021-01-12 DIAGNOSIS — I25110 Atherosclerotic heart disease of native coronary artery with unstable angina pectoris: Secondary | ICD-10-CM

## 2021-01-12 DIAGNOSIS — I214 Non-ST elevation (NSTEMI) myocardial infarction: Secondary | ICD-10-CM | POA: Diagnosis not present

## 2021-01-12 DIAGNOSIS — I10 Essential (primary) hypertension: Secondary | ICD-10-CM | POA: Diagnosis not present

## 2021-01-12 LAB — POCT ACTIVATED CLOTTING TIME
Activated Clotting Time: 213 seconds
Activated Clotting Time: 179 seconds

## 2021-01-12 LAB — GLUCOSE, CAPILLARY
Glucose-Capillary: 124 mg/dL — ABNORMAL HIGH (ref 70–99)
Glucose-Capillary: 145 mg/dL — ABNORMAL HIGH (ref 70–99)
Glucose-Capillary: 124 mg/dL — ABNORMAL HIGH (ref 70–99)
Glucose-Capillary: 121 mg/dL — ABNORMAL HIGH (ref 70–99)
Glucose-Capillary: 147 mg/dL — ABNORMAL HIGH (ref 70–99)

## 2021-01-12 LAB — CBC
HCT: 38.9 % (ref 36.0–46.0)
Hemoglobin: 12.7 g/dL (ref 12.0–15.0)
MCH: 29.3 pg (ref 26.0–34.0)
MCHC: 32.6 g/dL (ref 30.0–36.0)
MCV: 89.6 fL (ref 80.0–100.0)
Platelets: 174 10*3/uL (ref 150–400)
RBC: 4.34 MIL/uL (ref 3.87–5.11)
RDW: 14 % (ref 11.5–15.5)
WBC: 7.3 10*3/uL (ref 4.0–10.5)
nRBC: 0 % (ref 0.0–0.2)

## 2021-01-12 LAB — BASIC METABOLIC PANEL
Anion gap: 9 (ref 5–15)
BUN: 13 mg/dL (ref 8–23)
CO2: 19 mmol/L — ABNORMAL LOW (ref 22–32)
Calcium: 8.8 mg/dL — ABNORMAL LOW (ref 8.9–10.3)
Chloride: 111 mmol/L (ref 98–111)
Creatinine, Ser: 1.18 mg/dL — ABNORMAL HIGH (ref 0.44–1.00)
GFR, Estimated: 46 mL/min — ABNORMAL LOW (ref >60)
Glucose, Bld: 135 mg/dL — ABNORMAL HIGH (ref 70–99)
Potassium: 4 mmol/L (ref 3.5–5.1)
Sodium: 139 mmol/L (ref 135–145)

## 2021-01-12 LAB — HEPARIN LEVEL (UNFRACTIONATED)
Heparin Unfractionated: 0.11 IU/mL — ABNORMAL LOW (ref 0.30–0.70)
Heparin Unfractionated: 0.2 IU/mL — ABNORMAL LOW (ref 0.30–0.70)
Heparin Unfractionated: 0.34 IU/mL (ref 0.30–0.70)

## 2021-01-12 MED ORDER — PANTOPRAZOLE SODIUM 40 MG PO TBEC
40.0000 mg | DELAYED_RELEASE_TABLET | Freq: Every day | ORAL | Status: DC
  Administered 2021-01-13 – 2021-01-16 (×4): 40 mg via ORAL
  Filled 2021-01-12 (×5): qty 1

## 2021-01-12 MED ORDER — MAGNESIUM HYDROXIDE 400 MG/5ML PO SUSP
30.0000 mL | Freq: Every day | ORAL | Status: DC | PRN
  Administered 2021-01-12: 30 mL via ORAL
  Filled 2021-01-12: qty 30

## 2021-01-12 MED ORDER — NITROGLYCERIN IN D5W 200-5 MCG/ML-% IV SOLN
0.0000 ug/min | INTRAVENOUS | Status: DC
  Administered 2021-01-12 – 2021-01-15 (×2): 10 ug/min via INTRAVENOUS
  Administered 2021-01-16: 35 ug/min via INTRAVENOUS
  Filled 2021-01-12 (×3): qty 250

## 2021-01-12 MED ORDER — CLOPIDOGREL BISULFATE 75 MG PO TABS: 75.0000 mg | ORAL_TABLET | Freq: Every day | ORAL | Status: DC

## 2021-01-12 NOTE — Progress Notes (Signed)
Chaplain responded to consult request from patient requesting an AD. Chaplain carried it to her room and explained what it was to her and she is going to give it to her sister to fill out for her. Chaplain is available when needed.    01/12/21 1100  Clinical Encounter Type  Visited With Patient  Visit Type Other (Comment)  Referral From Nurse  Consult/Referral To Chaplain  Spiritual Encounters  Spiritual Needs Other (Comment)

## 2021-01-12 NOTE — Consult Note (Signed)
Cardiology Consultation:   Patient ID: [name] MRN: [id number]; DOB: 1941-05-27  Admit date: 01/09/2021 Date of Consult: 01/12/2021  PCP:  [name], MD   [facility] Cardiologist:  Dr. Vilinda Boehringer :1}     Patient Profile:   [name] is a 80 y.o. female with a hx of CAD with prior acute inferior MI treated with stents in 2015 treated with overlapping 2.75 mm x 30 and x 12 drug-eluting stents who is being seen 01/12/2021 for the evaluation of high risk PCI of ostial left main, ostial LAD, and proximal circumflex in the setting of severe three-vessel CAD with distal vessel tortuosity at the request of Mohan Harwani.  She was seen by surgery, Dr. Kipp Brood, 01/11/2021 however she refused to consider coronary bypass surgery as a treatment option.   History of Present Illness:   [name] has a profile outlined above.    I had a nice conversation with the patient.  She lives independently.  She walks using support/walker.  This occurred after she had ischemic occlusion of blood flow on her left lower extremity.  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   For 3 months she has been noticing chest discomfort if she walks too far or too fast.  Episodes of discomfort have become more easily provokable.  The discomfort is pressure in the chest and shortness of breath.  Also states that her heart races when this occurs.  If she stops and rests, the discomfort slowly improves.  Because of the progressive nature, she came to [facility] where cardiac markers demonstrate non-ST elevation MI.  She is currently pain-free.  She had chest and left neck discomfort earlier this morning requiring sublingual nitroglycerin.  Symptoms occurred at rest.  She continues to smoke cigarettes.  She has widespread vascular disease including PVD with prior left femoral-popliteal bypass 2016, is status post aortic aneurysm repair,  and status post right brain CVA 2016.  In addition she has a longstanding diabetic, smokes 1 pack of cigarettes per day,  Abdominal aneurysm repair 2012, prior hip arthroplasty, left as well as left knee arthroplasty 2016 and 2003 respectively   Past Medical History:  Diagnosis Date   AAA (abdominal aortic aneurysm) ([place])    Aneurysm ([place])    Arthritis    osteoarthritis   Constipation    Diabetes mellitus    Type 2, on Glimiperide   GERD (gastroesophageal reflux disease)    Glaucoma    Hyperlipidemia    Hypertension    Kidney stones    Myocardial infarction [facility])    Peripheral vascular disease ([place])    Pneumonia    Stroke ([place]) 01/2015   with left side weakness    TIA (transient ischemic attack)    Urinary incontinence     Past Surgical History:  Procedure Laterality Date   ABDOMINAL AORTAGRAM N/A 03/13/2014   Procedure: ABDOMINAL Maxcine Ham;  Surgeon: Elam Dutch, MD;  Location: [facility] CATH LAB;  Service: Cardiovascular;  Laterality: N/A;   ABDOMINAL AORTIC ANEURYSM REPAIR     ABDOMINAL AORTIC ANEURYSM REPAIR  ?2012   ABDOMINAL HYSTERECTOMY     partial   BACK SURGERY     CARDIAC CATHETERIZATION     CORONARY ANGIOPLASTY  03/2015   EYE SURGERY Right    laser surgery for blood behind eye, loss of sight   FEMORAL-POPLITEAL BYPASS GRAFT Left 07/06/2014   Procedure: BYPASS GRAFT FEMORAL-POPLITEAL ARTERY;  Surgeon: Elam Dutch, MD;  Location: [facility] OR;  Service: Vascular;  Laterality: Left;   HERNIA REPAIR     JOINT REPLACEMENT     LEFT HEART CATH AND CORONARY ANGIOGRAPHY N/A 01/11/2021   Procedure: LEFT HEART CATH AND CORONARY ANGIOGRAPHY;  Surgeon: [name], MD;  Location: [place] CV LAB;  Service: Cardiovascular;  Laterality: N/A;   LEFT HEART CATHETERIZATION WITH CORONARY ANGIOGRAM N/A 04/12/2014   Procedure: LEFT HEART CATHETERIZATION WITH CORONARY ANGIOGRAM;  Surgeon: Clent Demark, MD;  Location: [place] CATH LAB;  Service: Cardiovascular;  Laterality: N/A;    PERCUTANEOUS CORONARY STENT INTERVENTION (PCI-S)  04/12/2014   Procedure: PERCUTANEOUS CORONARY STENT INTERVENTION (PCI-S);  Surgeon: Clent Demark, MD;  Location: [facility] CATH LAB;  Service: Cardiovascular;;  prox and mid RCA   PERIPHERAL VASCULAR INTERVENTION  06/25/2019   Procedure: PERIPHERAL VASCULAR INTERVENTION;  Surgeon: Elam Dutch, MD;  Location: [place] CV LAB;  Service: Cardiovascular;;  Lt Renal and Rt Renal   RENAL ANGIOGRAPHY Bilateral 06/25/2019   Procedure: RENAL ANGIOGRAPHY;  Surgeon: Elam Dutch, MD;  Location: [place] CV LAB;  Service: Cardiovascular;  Laterality: Bilateral;   TOTAL HIP ARTHROPLASTY Left 05/18/2015   TOTAL HIP ARTHROPLASTY Left 05/18/2015   Procedure: LEFT TOTAL HIP ARTHROPLASTY ANTERIOR APPROACH;  Surgeon: Mcarthur Rossetti, MD;  Location: [place];  Service: Orthopedics;  Laterality: Left;   TOTAL KNEE ARTHROPLASTY Left ~ 2003   TUBAL LIGATION     uterine tumor     VENTRAL HERNIA REPAIR       Home Medications:  Prior to Admission medications   Medication Sig Start Date End Date Taking? Authorizing Provider  acetaminophen (TYLENOL) 650 MG CR tablet Take 650 mg by mouth at bedtime as needed for pain.    Yes [provider]  albuterol (PROVENTIL HFA;VENTOLIN HFA) 108 (90 Base) MCG/ACT inhaler Inhale 2 puffs into the lungs every 4 (four) hours as needed for wheezing or shortness of breath (or coughing). 1/74/94  Yes Delora Fuel, MD  amLODipine (NORVASC) 5 MG tablet Take 5 mg by mouth daily. 09/01/19  Yes [provider]  aspirin EC 81 MG tablet Take 1 tablet (81 mg total) by mouth daily. Patient taking differently: Take 243 mg by mouth daily as needed (chest pain). 06/25/19  Yes Fields, Jessy Oto, MD  atorvastatin (LIPITOR) 40 MG tablet Take 1 tablet (40 mg total) by mouth daily. 10/15/19 01/10/21 Yes Georgette Shell, MD  blood glucose meter kit and supplies KIT Dispense based on patient and insurance preference. Use up to  four times daily as directed. (FOR ICD-9 250.00, 250.01). Patient taking differently: Inject 1 each into the skin See admin instructions. Dispense based on patient and insurance preference. Use up to four times daily as directed. (FOR ICD-9 250.00, 250.01). 10/15/19  Yes Georgette Shell, MD  Blood Glucose Monitoring Suppl (BAYER BREEZE 2 SYSTEM) w/Device KIT 1 each by Does not apply route 2 (two) times daily. 10/15/19  Yes Georgette Shell, MD  clopidogrel (PLAVIX) 75 MG tablet Take 75 mg by mouth daily.   Yes [provider]  esomeprazole (NEXIUM) 40 MG capsule Take 40 mg by mouth daily as needed (acid reflux).    Yes [provider]  glimepiride (AMARYL) 2 MG tablet Take 1 tablet (2 mg total) by mouth every morning. 10/15/19 01/10/21 Yes Georgette Shell, MD  isosorbide mononitrate (IMDUR) 60 MG 24 hr tablet Take 60 mg by mouth daily. 02/24/15  Yes  [provider]  metoprolol succinate (TOPROL-XL) 100 MG 24 hr tablet Take 100 mg by mouth daily. 06/09/19  Yes [provider]  nitroGLYCERIN (NITROSTAT) 0.4 MG SL tablet Place 0.4 mg under the tongue every 5 (five) minutes as needed for chest pain.   Yes [provider]  busPIRone (BUSPAR) 5 MG tablet Take 1 tablet (5 mg total) by mouth 2 (two) times daily. Patient not taking: No sig reported 10/15/19   Georgette Shell, MD  spironolactone (ALDACTONE) 25 MG tablet Take 25 mg by mouth daily. Patient not taking: No sig reported 08/04/19   [provider]    Inpatient Medications: Scheduled Meds:  amLODipine  5 mg Oral Daily   aspirin EC  81 mg Oral Daily   atorvastatin  80 mg Oral Daily   clopidogrel  75 mg Oral Daily   insulin aspart  0-5 Units Subcutaneous QHS   insulin aspart  0-9 Units Subcutaneous TID WC   metoprolol tartrate  25 mg Oral BID   nicotine  14 mg Transdermal Daily   pantoprazole (PROTONIX) IV  40 mg Intravenous Q24H   sodium chloride flush  3 mL Intravenous Q12H    sodium chloride flush  3 mL Intravenous Q12H   Continuous Infusions:  sodium chloride     heparin 800 Units/hr (01/12/21 0447)   lactated ringers Stopped (01/11/21 0906)   nitroGLYCERIN     PRN Meds: sodium chloride, acetaminophen **OR** acetaminophen, albuterol, nitroGLYCERIN, ondansetron (ZOFRAN) IV, sodium chloride flush  Allergies:    Allergies  Allergen Reactions   Keflex [Cephalexin] Shortness Of Breath and Other (See Comments)    Chest pain, headache   Lipitor [Atorvastatin] Shortness Of Breath and Other (See Comments)    Severe pain/cramping in legs- "I cannot walk, talk, or breathe" (pt is currently taking 20 mg daily 10/11/19)   Codeine Other (See Comments)    Disrupted patient's equilibrium- "Made my world flip upside down"   Hydrochlorothiazide Other (See Comments)    Other reaction(s): lightheaded, nausea   Hydrocodone-Acetaminophen Other (See Comments)    Other reaction(s): "get crazy"   Metformin Hcl Other (See Comments)    Other reaction(s): upset stomach   Tramadol Hcl Other (See Comments)    Other reaction(s): "get crazy"   Azor [Amlodipine-Olmesartan] Swelling, Palpitations and Rash    Amlodipine alone resumed 12/2013, tolerating   Lisinopril Cough   Penicillins Dermatitis and Other (See Comments)    Did it involve swelling of the face/tongue/throat, SOB, or low BP? No, just worsened eczema Did it involve sudden or severe rash/hives, skin peeling, or any reaction on the inside of your mouth or nose? No Did you need to seek medical attention at a hospital or doctor's office? No When did it last happen? "More than 10 years ago" If all above answers are "NO", may proceed with cephalosporin use.     Social History:   Social History   Socioeconomic History   Marital status: Widowed    Spouse name: Not on file   Number of children: Not on file   Years of education: Not on file   Highest education level: Not on file  Occupational History   Not on file   Tobacco Use   Smoking status: Every Day    Packs/day: 0.50    Years: 40.00    Pack years: 20.00    Types: Cigarettes   Smokeless tobacco: Never  Vaping Use   Vaping Use: Never used  Substance and Sexual Activity  Alcohol use: No    Alcohol/week: 0.0 standard drinks   Drug use: No   Sexual activity: Not on file  Other Topics Concern   Not on file  Social History Narrative   Not on file   Social Determinants of Health   Financial Resource Strain: Not on file  Food Insecurity: Not on file  Transportation Needs: Not on file  Physical Activity: Not on file  Stress: Not on file  Social Connections: Not on file  Intimate Partner Violence: Not on file    Family History:    Family History  Problem Relation Age of Onset   Diabetes Mother    Stroke Mother    Hypertension Father    Stroke Father    Hyperlipidemia Sister    Hypertension Sister    Aneurysm Sister    Hyperlipidemia Brother    Hypertension Brother      ROS:  Please see the history of present illness.  Very frightened about having surgery.  One of her sisters, Judson Roch, was on the phone as we spoke today.  States she does not have a good relationship with her daughters.  She is not in assisted living.  Able to cook for herself.  Having difficulty ambulating down the hall in her apartment building because of chest discomfort.  She otherwise sleeps okay.  She has not short of breath at rest.  She is able to lay flat.  She is not having lower extremity pain. All other ROS reviewed and negative.     Physical Exam/Data:   Vitals:   01/12/21 0757 01/12/21 0806 01/12/21 0810 01/12/21 0813  BP: (!) 166/84  (!) 144/69 (!) 144/69  Pulse: 70 69 83 79  Resp: 20 19  (!) 22  Temp: 98.6 F (37 C)     TempSrc: Oral     SpO2: 96% 95%  95%  Weight:      Height:        Intake/Output Summary (Last 24 hours) at 01/12/2021 0950 Last data filed at 01/11/2021 1558 Gross per 24 hour  Intake 408.01 ml  Output --  Net 408.01  ml   Last 3 Weights 01/10/2021 01/10/2021 09/20/2020  Weight (lbs) 163 lb 2.3 oz 163 lb 2.3 oz 163 lb 2.3 oz  Weight (kg) 74 kg 74 kg 74 kg     Body mass index is 25.55 kg/m.  General:  Well nourished, well developed, in no acute distress.  She is not overweight. HEENT: normal Lymph: no adenopathy Neck: no JVD Endocrine:  No thryomegaly Vascular: No carotid bruits; FA pulses poorly palpable in the right with decreased popliteal and dorsalis pedis pulses in the right foot.  Left inguinal/femoral bruits with intact popliteal and dorsalis pedis pulses. Cardiac:  normal S1, S2; RRR; no murmur  Lungs:  clear to auscultation bilaterally, no wheezing, rhonchi or rales  Abd: soft, nontender, no hepatomegaly  Ext: no edema Musculoskeletal:  No deformities, BUE and BLE strength normal and equal Skin: warm and dry  Neuro:  CNs 2-12 intact, mild left-sided weakness. Psych:  Normal affect   EKG:  The EKG was personally reviewed and demonstrates: Marked anterolateral symmetrical T wave inversion V3 through 6, I, and aVL.  No Q waves are noted. Telemetry:  Telemetry was personally reviewed and demonstrates: Normal sinus rhythm  Relevant CV Studies: Coronary angiography 01/11/2021: Diagnostic Dominance: Right   Laboratory Data:  High Sensitivity Troponin:   Recent Labs  Lab 01/10/21 0030 01/10/21 0939 01/10/21 1110 01/10/21 1937 01/11/21  [id number]  TROPONINIHS 1,018* 6,501* [phone]*     Chemistry Recent Labs  Lab 01/10/21 0939 01/11/21 0332 01/12/21 0129  NA 137 137 139  K 4.2 4.1 4.0  CL 106 106 111  CO2 23 23 19*  GLUCOSE 171* 223* 135*  BUN '23 19 13  ' CREATININE 1.36* 1.31* 1.18*  CALCIUM 9.1 9.0 8.8*  GFRNONAA 39* 41* 46*  ANIONGAP '8 8 9    ' No results for input(s): PROT, ALBUMIN, AST, ALT, ALKPHOS, BILITOT in the last 168 hours. Hematology Recent Labs  Lab 01/10/21 0447 01/11/21 0332 01/12/21 0129  WBC 7.9 6.8 7.3  RBC 4.70 4.36 4.34  HGB 13.7 12.7 12.7   HCT 42.5 39.5 38.9  MCV 90.4 90.6 89.6  MCH 29.1 29.1 29.3  MCHC 32.2 32.2 32.6  RDW 14.6 14.0 14.0  PLT 235 196 174   BNPNo results for input(s): BNP, PROBNP in the last 168 hours.  DDimer No results for input(s): DDIMER in the last 168 hours.   Radiology/Studies:  CARDIAC CATHETERIZATION  Result Date: 01/11/2021   Prox RCA lesion is 85% stenosed.   Ost LM lesion is 80% stenosed.   Ost LAD lesion is 90% stenosed.   Prox LAD lesion is 80% stenosed.   Mid LAD to Dist LAD lesion is 70% stenosed.   Prox Cx lesion is 90% stenosed.   2nd Mrg lesion is 60% stenosed.   3rd Mrg lesion is 70% stenosed.   Previously placed Prox RCA to Mid RCA stent (unknown type) is  widely patent.   DG Chest Portable 1 View  Result Date: 01/09/2021 CLINICAL DATA:  Chest pain EXAM: PORTABLE CHEST 1 VIEW COMPARISON:  09/20/2020 FINDINGS: Cardiac shadow is stable. The lungs are well aerated bilaterally. No focal infiltrate or effusion is seen. No bony abnormality is noted. IMPRESSION: No active disease. Electronically Signed   By: Inez Catalina M.D.   On: 01/09/2021 22:59   ECHOCARDIOGRAM COMPLETE  Result Date: 01/10/2021    ECHOCARDIOGRAM REPORT   Patient Name:   [name] Date of Exam: 01/10/2021 Medical Rec #:  [id number]      Height:       67.0 in Accession #:    [id number]     Weight:       163.1 lb Date of Birth:  Mar 27, 1941       BSA:          1.855 m Patient Age:    89 years       BP:           117/58 mmHg Patient Gender: F              HR:           65 bpm. Exam Location:  Inpatient Procedure: 2D Echo, Cardiac Doppler and Color Doppler Indications:    NSTEMI  History:        Patient has prior history of Echocardiogram examinations, most                 recent 10/12/2019. Acute MI, Previous Myocardial Infarction and                 CAD, Stroke and PVD; Risk Factors:Hypertension, Dyslipidemia,                 Current Smoker and Diabetes.  Sonographer:    Dustin Flock RDCS Referring Phys: [place]  1. Left ventricular ejection fraction, by estimation, is 45 to 50%.  The left ventricle has low normal function. The left ventricle demonstrates regional wall motion abnormalities (see scoring diagram/findings for description). There is mild concentric left ventricular hypertrophy. Left ventricular diastolic parameters are consistent with Grade I diastolic dysfunction (impaired relaxation).  2. Right ventricular systolic function is normal. The right ventricular size is normal. There is mildly elevated pulmonary artery systolic pressure.  3. The mitral valve is normal in structure. Trivial mitral valve regurgitation.  4. The aortic valve is tricuspid. Aortic valve regurgitation is not visualized. Mild aortic valve sclerosis is present, with no evidence of aortic valve stenosis.  5. The inferior vena cava is normal in size with <50% respiratory variability, suggesting right atrial pressure of 8 mmHg. FINDINGS  Left Ventricle: Left ventricular ejection fraction, by estimation, is 45 to 50%. The left ventricle has low normal function. The left ventricle demonstrates regional wall motion abnormalities. Severe hypokinesis of the left ventricular, apical apical segment. The left ventricular internal cavity size was normal in size. There is mild concentric left ventricular hypertrophy. Left ventricular diastolic parameters are consistent with Grade I diastolic dysfunction (impaired relaxation). Right Ventricle: The right ventricular size is normal. No increase in right ventricular wall thickness. Right ventricular systolic function is normal. There is mildly elevated pulmonary artery systolic pressure. The tricuspid regurgitant velocity is 3.24  m/s, and with an assumed right atrial pressure of 3 mmHg, the estimated right ventricular systolic pressure is 49.4 mmHg. Left Atrium: Left atrial size was normal in size. Right Atrium: Right atrial size was normal in size. Pericardium: There is no evidence of  pericardial effusion. Mitral Valve: The mitral valve is normal in structure. There is mild thickening of the mitral valve leaflet(s). Trivial mitral valve regurgitation. Tricuspid Valve: The tricuspid valve is normal in structure. Tricuspid valve regurgitation is mild. Aortic Valve: The aortic valve is tricuspid. Aortic valve regurgitation is not visualized. Mild aortic valve sclerosis is present, with no evidence of aortic valve stenosis. Pulmonic Valve: The pulmonic valve was normal in structure. Pulmonic valve regurgitation is not visualized. Aorta: The aortic root is normal in size and structure. Venous: The inferior vena cava is normal in size with less than 50% respiratory variability, suggesting right atrial pressure of 8 mmHg. IAS/Shunts: No atrial level shunt detected by color flow Doppler.  LEFT VENTRICLE PLAX 2D LVIDd:         4.00 cm  Diastology LVIDs:         2.90 cm  LV e' medial:    3.05 cm/s LV PW:         1.10 cm  LV E/e' medial:  17.2 LV IVS:        1.10 cm  LV e' lateral:   4.35 cm/s LVOT diam:     1.80 cm  LV E/e' lateral: 12.0 LV SV:         42 LV SV Index:   23 LVOT Area:     2.54 cm  RIGHT VENTRICLE RV Basal diam:  2.80 cm RV S prime:     10.70 cm/s TAPSE (M-mode): 2.1 cm LEFT ATRIUM             Index       RIGHT ATRIUM          Index LA diam:        3.10 cm 1.67 cm/m  RA Area:     9.52 cm LA Vol (A2C):   26.5 ml 14.29 ml/m RA Volume:   18.90 ml 10.19 ml/m LA  Vol (A4C):   24.8 ml 13.37 ml/m LA Biplane Vol: 25.8 ml 13.91 ml/m  AORTIC VALVE LVOT Vmax:   85.00 cm/s LVOT Vmean:  51.000 cm/s LVOT VTI:    0.166 m  AORTA Ao Root diam: 2.70 cm MITRAL VALVE               TRICUSPID VALVE MV Area (PHT): 3.70 cm    TR Peak grad:   42.0 mmHg MV Decel Time: 205 msec    TR Vmax:        324.00 cm/s MV E velocity: 52.40 cm/s MV A velocity: 99.90 cm/s  SHUNTS MV E/A ratio:  0.52        Systemic VTI:  0.17 m                            Systemic Diam: 1.80 cm [name] MD Electronically signed by [name] MD Signature Date/Time: 01/10/2021/4:56:48 PM    Final      Assessment and Plan:   Non-ST elevation myocardial infarction:  Post presentation chest discomfort at rest within the past 24 hours. Coronary artery disease with unstable angina: due to high-grade ostial LAD in the setting of multivessel angiographically significant three-vessel CAD including left main.  I reviewed the images and note that percutaneous therapy would leave significant incomplete revascularization and be associated with significant risk of procedural ischemic complications.  This was discussed with the patient.  In my opinion her treatment option is surgical revascularization if a candidate.  After our discussion the patient is willing to discuss with surgery and consider CABG if she is a candidate. Peripheral arterial disease: Diminished right femoral pulse and absent right foot pedal pulses.  Status post right femoral-popliteal bypass. Smoking, ongoing. Tobacco abuse Diabetes mellitus type 2 Stage IIIb CKD.         I have multiple colleagues reviewing her images and will render an opinion concerning suitability for high risk PCI.  I have spoken with Dr. Terrence Dupont and let him know that my skill set would be challenged by this case and have decided to defer to colleagues who would be willing to take this case on.  After our conversation, the patient would like to have further discussion with Dr. Kipp Brood concerning coronary bypass grafting.   Risk Assessment/Risk Scores:     TIMI Risk Score for Unstable Angina or Non-ST Elevation MI:   The patient's TIMI risk score is  , which indicates a  % risk of all cause mortality, new or recurrent myocardial infarction or need for urgent revascularization in the next 14 days.           For questions or updates, please contact [place] Please consult [email or web address] for contact info under    Signed, Sinclair Grooms, MD  01/12/2021 9:50 AM

## 2021-01-12 NOTE — Plan of Care (Signed)
  Problem: Education: Goal: Knowledge of General Education information will improve Description: Including pain rating scale, medication(s)/side effects and non-pharmacologic comfort measures Outcome: Progressing   Problem: Health Behavior/Discharge Planning: Goal: Ability to manage health-related needs will improve Outcome: Progressing   Problem: Clinical Measurements: Goal: Will remain free from infection Outcome: Progressing Goal: Respiratory complications will improve Outcome: Progressing   Problem: Activity: Goal: Risk for activity intolerance will decrease Outcome: Completed/Met   Problem: Nutrition: Goal: Adequate nutrition will be maintained Outcome: Progressing

## 2021-01-12 NOTE — Progress Notes (Signed)
ANTICOAGULATION CONSULT NOTE - Follow Up Consult  Pharmacy Consult for Heparin Indication: chest pain/ACS  Allergies  Allergen Reactions   Keflex [Cephalexin] Shortness Of Breath and Other (See Comments)    Chest pain, headache   Lipitor [Atorvastatin] Shortness Of Breath and Other (See Comments)    Severe pain/cramping in legs- "I cannot walk, talk, or breathe" (pt is currently taking 20 mg daily 10/11/19)   Codeine Other (See Comments)    Disrupted patient's equilibrium- "Made my world flip upside down"   Hydrochlorothiazide Other (See Comments)    Other reaction(s): lightheaded, nausea   Hydrocodone-Acetaminophen Other (See Comments)    Other reaction(s): "get crazy"   Metformin Hcl Other (See Comments)    Other reaction(s): upset stomach   Tramadol Hcl Other (See Comments)    Other reaction(s): "get crazy"   Azor [Amlodipine-Olmesartan] Swelling, Palpitations and Rash    Amlodipine alone resumed 12/2013, tolerating   Lisinopril Cough   Penicillins Dermatitis and Other (See Comments)    Did it involve swelling of the face/tongue/throat, SOB, or low BP? No, just worsened eczema Did it involve sudden or severe rash/hives, skin peeling, or any reaction on the inside of your mouth or nose? No Did you need to seek medical attention at a hospital or doctor's office? No When did it last happen? "More than 10 years ago" If all above answers are "NO", may proceed with cephalosporin use.       Vital Signs: Temp: 98.2 F (36.8 C) (08/17 2032) Temp Source: Oral (08/17 2032) BP: 138/65 (08/17 2205) Pulse Rate: 75 (08/17 2205)  Labs: Recent Labs    01/10/21 0447 01/10/21 0820 01/10/21 0939 01/10/21 1110 01/10/21 1926 01/10/21 1937 01/11/21 0332 01/12/21 0129 01/12/21 1136 01/12/21 2134  HGB 13.7  --   --   --   --   --  12.7 12.7  --   --   HCT 42.5  --   --   --   --   --  39.5 38.9  --   --   PLT 235  --   --   --   --   --  196 174  --   --   HEPARINUNFRC  --    < >   --   --    < >  --  0.46 0.11* 0.20* 0.34  CREATININE  --   --  1.36*  --   --   --  1.31* 1.18*  --   --   TROPONINIHS  --   --  6,501* 6,571*  --  5,766* 4,581*  --   --   --    < > = values in this interval not displayed.    Assessment: 80 y/o F with chest pain noe s/p cath with multivessel CAD. Plans for possible CABG.  Heparin was restarted s/p cath  Heparin level therapeutic on 950 units/hr.   Goal of Therapy:  Heparin level 0.3-0.7 units/ml Monitor platelets by anticoagulation protocol: Yes   Plan:  -Continue heparin at 950 units/hr -Heparin level in 8 hours and daily wth CBC daily  Albertina Parr, PharmD., BCPS, BCCCP Clinical Pharmacist Please refer to [facility] for unit-specific pharmacist

## 2021-01-12 NOTE — Progress Notes (Deleted)
ANTICOAGULATION CONSULT NOTE - Follow Up Consult  Pharmacy Consult for Heparin Indication: chest pain/ACS  Allergies  Allergen Reactions   Keflex [Cephalexin] Shortness Of Breath and Other (See Comments)    Chest pain, headache   Lipitor [Atorvastatin] Shortness Of Breath and Other (See Comments)    Severe pain/cramping in legs- "I cannot walk, talk, or breathe" (pt is currently taking 20 mg daily 10/11/19)   Codeine Other (See Comments)    Disrupted patient's equilibrium- "Made my world flip upside down"   Hydrochlorothiazide Other (See Comments)    Other reaction(s): lightheaded, nausea   Hydrocodone-Acetaminophen Other (See Comments)    Other reaction(s): "get crazy"   Metformin Hcl Other (See Comments)    Other reaction(s): upset stomach   Tramadol Hcl Other (See Comments)    Other reaction(s): "get crazy"   Azor [Amlodipine-Olmesartan] Swelling, Palpitations and Rash    Amlodipine alone resumed 12/2013, tolerating   Lisinopril Cough   Penicillins Dermatitis and Other (See Comments)    Did it involve swelling of the face/tongue/throat, SOB, or low BP? No, just worsened eczema Did it involve sudden or severe rash/hives, skin peeling, or any reaction on the inside of your mouth or nose? No Did you need to seek medical attention at a hospital or doctor's office? No When did it last happen? "More than 10 years ago" If all above answers are "NO", may proceed with cephalosporin use.     Vital Signs: Temp: 98.2 F (36.8 C) (08/17 2032) Temp Source: Oral (08/17 2032) BP: 138/65 (08/17 2205) Pulse Rate: 75 (08/17 2205)  Labs: Recent Labs    01/10/21 0447 01/10/21 0820 01/10/21 0939 01/10/21 1110 01/10/21 1926 01/10/21 1937 01/11/21 0332 01/12/21 0129 01/12/21 1136 01/12/21 2134  HGB 13.7  --   --   --   --   --  12.7 12.7  --   --   HCT 42.5  --   --   --   --   --  39.5 38.9  --   --   PLT 235  --   --   --   --   --  196 174  --   --   HEPARINUNFRC  --    < >  --    --    < >  --  0.46 0.11* 0.20* 0.34  CREATININE  --   --  1.36*  --   --   --  1.31* 1.18*  --   --   TROPONINIHS  --   --  6,501* 6,571*  --  5,766* 4,581*  --   --   --    < > = values in this interval not displayed.    Assessment: 80 y/o F with chest pain noe s/p cath with multivessel CAD. Plans for possible CABG.  Heparin was restarted s/p cath.  Heparin level 0.34 (therapeutic) on gtt at 950 units/hr. No bleeding noted.  Goal of Therapy:  Heparin level 0.3-0.7 units/ml Monitor platelets by anticoagulation protocol: Yes   Plan:  Continue heparin at 950 units/hr Daily heparin level and CBC  Sherlon Handing, PharmD, BCPS Please see amion for complete clinical pharmacist phone list 01/12/2021 11:12 PM

## 2021-01-12 NOTE — Progress Notes (Signed)
ANTICOAGULATION CONSULT NOTE - Follow Up Consult  Pharmacy Consult for Heparin Indication: chest pain/ACS  Allergies  Allergen Reactions   Keflex [Cephalexin] Shortness Of Breath and Other (See Comments)    Chest pain, headache   Lipitor [Atorvastatin] Shortness Of Breath and Other (See Comments)    Severe pain/cramping in legs- "I cannot walk, talk, or breathe" (pt is currently taking 20 mg daily 10/11/19)   Codeine Other (See Comments)    Disrupted patient's equilibrium- "Made my world flip upside down"   Hydrochlorothiazide Other (See Comments)    Other reaction(s): lightheaded, nausea   Hydrocodone-Acetaminophen Other (See Comments)    Other reaction(s): "get crazy"   Metformin Hcl Other (See Comments)    Other reaction(s): upset stomach   Tramadol Hcl Other (See Comments)    Other reaction(s): "get crazy"   Azor [Amlodipine-Olmesartan] Swelling, Palpitations and Rash    Amlodipine alone resumed 12/2013, tolerating   Lisinopril Cough   Penicillins Dermatitis and Other (See Comments)    Did it involve swelling of the face/tongue/throat, SOB, or low BP? No, just worsened eczema Did it involve sudden or severe rash/hives, skin peeling, or any reaction on the inside of your mouth or nose? No Did you need to seek medical attention at a hospital or doctor's office? No When did it last happen? "More than 10 years ago" If all above answers are "NO", may proceed with cephalosporin use.       Vital Signs: Temp: 98.6 F (37 C) (08/17 0757) Temp Source: Oral (08/17 0757) BP: 144/69 (08/17 0813) Pulse Rate: 79 (08/17 0813)  Labs: Recent Labs    01/10/21 0447 01/10/21 0820 01/10/21 0939 01/10/21 1110 01/10/21 1926 01/10/21 1937 01/11/21 0332 01/12/21 0129 01/12/21 1136  HGB 13.7  --   --   --   --   --  12.7 12.7  --   HCT 42.5  --   --   --   --   --  39.5 38.9  --   PLT 235  --   --   --   --   --  196 174  --   HEPARINUNFRC  --    < >  --   --    < >  --  0.46 0.11*  0.20*  CREATININE  --   --  1.36*  --   --   --  1.31* 1.18*  --   TROPONINIHS  --   --  6,501* 6,571*  --  5,766* 4,581*  --   --    < > = values in this interval not displayed.    Assessment: 80 y/o F with chest pain noe s/p cath with multivessel CAD. Plans for possible CABG.  Heparin was restarted s/p cath -heparin level = 0.2 on 800 units.hr   Goal of Therapy:  Heparin level 0.3-0.7 units/ml Monitor platelets by anticoagulation protocol: Yes   Plan:  -Increase heparin to 950 units/hr -Heparin level in 8 hours and daily wth CBC daily  Hildred Laser, PharmD Clinical Pharmacist **Pharmacist phone directory can now be found on [email or web address] ([email or web address]).  Listed under [place].

## 2021-01-12 NOTE — Progress Notes (Signed)
Subjective:  Presently denies any chest pain or shortness of breath, had earlier chest pain radiating to attend requiring 2 sublingual nitroglycerin with relief.  Patient was seen by CVT S yesterday.  Patient has refused for open heart surgery, but willing to proceed with high risk PCI.  Discussed with Dr. Irish Lack and Dr. Tamala Julian.  They will review the films further and discuss with team.  Objective:  Vital Signs in the last 24 hours: Temp:  [97.7 F (36.5 C)-98.6 F (37 C)] 98.6 F (37 C) (08/17 0757) Pulse Rate:  [55-83] 79 (08/17 0813) Resp:  [10-31] 22 (08/17 0813) BP: (132-166)/(61-114) 144/69 (08/17 0813) SpO2:  [93 %-100 %] 95 % (08/17 0813)  Intake/Output from previous day: 08/16 0701 - 08/17 0700 In: 408 [P.O.:240; I.V.:168] Out: -  Intake/Output from this shift: No intake/output data recorded.  Physical Exam: Neck: no adenopathy, no carotid bruit, no JVD, and supple, symmetrical, trachea midline Lungs: clear to auscultation bilaterally Heart: regular rate and rhythm, S1, S2 normal, and 2/6 systolic murmur noted Abdomen: soft, non-tender; bowel sounds normal; no masses,  no organomegaly Extremities: extremities normal, atraumatic, no cyanosis or edema and right groin stable  Lab Results: Recent Labs    01/11/21 0332 01/12/21 0129  WBC 6.8 7.3  HGB 12.7 12.7  PLT 196 174   Recent Labs    01/11/21 0332 01/12/21 0129  NA 137 139  K 4.1 4.0  CL 106 111  CO2 23 19*  GLUCOSE 223* 135*  BUN 19 13  CREATININE 1.31* 1.18*   No results for input(s): TROPONINI in the last 72 hours.  Invalid input(s): CK, MB Hepatic Function Panel No results for input(s): PROT, ALBUMIN, AST, ALT, ALKPHOS, BILITOT, BILIDIR, IBILI in the last 72 hours. Recent Labs    01/10/21 0939  CHOL 247*   No results for input(s): PROTIME in the last 72 hours.  Imaging: CARDIAC CATHETERIZATION  Result Date: 01/11/2021   Prox RCA lesion is 85% stenosed.   Ost LM lesion is 80% stenosed.    Ost LAD lesion is 90% stenosed.   Prox LAD lesion is 80% stenosed.   Mid LAD to Dist LAD lesion is 70% stenosed.   Prox Cx lesion is 90% stenosed.   2nd Mrg lesion is 60% stenosed.   3rd Mrg lesion is 70% stenosed.   Previously placed Prox RCA to Mid RCA stent (unknown type) is  widely patent.   ECHOCARDIOGRAM COMPLETE  Result Date: 01/10/2021    ECHOCARDIOGRAM REPORT   Patient Name:   [name] Date of Exam: 01/10/2021 Medical Rec #:  [id number]      Height:       67.0 in Accession #:    [id number]     Weight:       163.1 lb Date of Birth:  02-May-1941       BSA:          1.855 m Patient Age:    80 years       BP:           117/58 mmHg Patient Gender: F              HR:           65 bpm. Exam Location:  Inpatient Procedure: 2D Echo, Cardiac Doppler and Color Doppler Indications:    NSTEMI  History:        Patient has prior history of Echocardiogram examinations, most  recent 10/12/2019. Acute MI, Previous Myocardial Infarction and                 CAD, Stroke and PVD; Risk Factors:Hypertension, Dyslipidemia,                 Current Smoker and Diabetes.  Sonographer:    Dustin Flock RDCS Referring Phys: [place]  1. Left ventricular ejection fraction, by estimation, is 45 to 50%. The left ventricle has low normal function. The left ventricle demonstrates regional wall motion abnormalities (see scoring diagram/findings for description). There is mild concentric left ventricular hypertrophy. Left ventricular diastolic parameters are consistent with Grade I diastolic dysfunction (impaired relaxation).  2. Right ventricular systolic function is normal. The right ventricular size is normal. There is mildly elevated pulmonary artery systolic pressure.  3. The mitral valve is normal in structure. Trivial mitral valve regurgitation.  4. The aortic valve is tricuspid. Aortic valve regurgitation is not visualized. Mild aortic valve sclerosis is present, with no evidence of aortic  valve stenosis.  5. The inferior vena cava is normal in size with <50% respiratory variability, suggesting right atrial pressure of 8 mmHg. FINDINGS  Left Ventricle: Left ventricular ejection fraction, by estimation, is 45 to 50%. The left ventricle has low normal function. The left ventricle demonstrates regional wall motion abnormalities. Severe hypokinesis of the left ventricular, apical apical segment. The left ventricular internal cavity size was normal in size. There is mild concentric left ventricular hypertrophy. Left ventricular diastolic parameters are consistent with Grade I diastolic dysfunction (impaired relaxation). Right Ventricle: The right ventricular size is normal. No increase in right ventricular wall thickness. Right ventricular systolic function is normal. There is mildly elevated pulmonary artery systolic pressure. The tricuspid regurgitant velocity is 3.24  m/s, and with an assumed right atrial pressure of 3 mmHg, the estimated right ventricular systolic pressure is 28.4 mmHg. Left Atrium: Left atrial size was normal in size. Right Atrium: Right atrial size was normal in size. Pericardium: There is no evidence of pericardial effusion. Mitral Valve: The mitral valve is normal in structure. There is mild thickening of the mitral valve leaflet(s). Trivial mitral valve regurgitation. Tricuspid Valve: The tricuspid valve is normal in structure. Tricuspid valve regurgitation is mild. Aortic Valve: The aortic valve is tricuspid. Aortic valve regurgitation is not visualized. Mild aortic valve sclerosis is present, with no evidence of aortic valve stenosis. Pulmonic Valve: The pulmonic valve was normal in structure. Pulmonic valve regurgitation is not visualized. Aorta: The aortic root is normal in size and structure. Venous: The inferior vena cava is normal in size with less than 50% respiratory variability, suggesting right atrial pressure of 8 mmHg. IAS/Shunts: No atrial level shunt detected by  color flow Doppler.  LEFT VENTRICLE PLAX 2D LVIDd:         4.00 cm  Diastology LVIDs:         2.90 cm  LV e' medial:    3.05 cm/s LV PW:         1.10 cm  LV E/e' medial:  17.2 LV IVS:        1.10 cm  LV e' lateral:   4.35 cm/s LVOT diam:     1.80 cm  LV E/e' lateral: 12.0 LV SV:         42 LV SV Index:   23 LVOT Area:     2.54 cm  RIGHT VENTRICLE RV Basal diam:  2.80 cm RV S prime:     10.70  cm/s TAPSE (M-mode): 2.1 cm LEFT ATRIUM             Index       RIGHT ATRIUM          Index LA diam:        3.10 cm 1.67 cm/m  RA Area:     9.52 cm LA Vol (A2C):   26.5 ml 14.29 ml/m RA Volume:   18.90 ml 10.19 ml/m LA Vol (A4C):   24.8 ml 13.37 ml/m LA Biplane Vol: 25.8 ml 13.91 ml/m  AORTIC VALVE LVOT Vmax:   85.00 cm/s LVOT Vmean:  51.000 cm/s LVOT VTI:    0.166 m  AORTA Ao Root diam: 2.70 cm MITRAL VALVE               TRICUSPID VALVE MV Area (PHT): 3.70 cm    TR Peak grad:   42.0 mmHg MV Decel Time: 205 msec    TR Vmax:        324.00 cm/s MV E velocity: 52.40 cm/s MV A velocity: 99.90 cm/s  SHUNTS MV E/A ratio:  0.52        Systemic VTI:  0.17 m                            Systemic Diam: 1.80 cm [name] MD Electronically signed by [name] MD Signature Date/Time: 01/10/2021/4:56:48 PM    Final     Cardiac Studies:  Assessment/Plan:  Acute NSTEMI status post left cardiac catheterization, 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  Status post PCI to RCA in November 2015 Hypertension. Hyperlipidemia. Diabetes mellitus. History of CVA. Peripheral vascular disease, history of renal stenting and femoral popliteal bypass in the past. 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 Chronic kidney disease, stage IIIB, improved. Tobacco abuse. Plan Restart IV nitroglycerin, as per orders. Discussed with patient at length regarding various options of treatment and still refusing for CABG,  but willing to proceed with RS PCI. Will start back on Plavix. Awaiting coronary medical group recommendations  LOS: 2 days    [name] 01/12/2021, 9:19 AM

## 2021-01-12 NOTE — Progress Notes (Signed)
[place] for Heparin Indication: chest pain/ACS  Allergies  Allergen Reactions   Keflex [Cephalexin] Shortness Of Breath and Other (See Comments)    Chest pain, headache   Lipitor [Atorvastatin] Shortness Of Breath and Other (See Comments)    Severe pain/cramping in legs- "I cannot walk, talk, or breathe" (pt is currently taking 20 mg daily 10/11/19)   Codeine Other (See Comments)    Disrupted patient's equilibrium- "Made my world flip upside down"   Hydrochlorothiazide Other (See Comments)    Other reaction(s): lightheaded, nausea   Hydrocodone-Acetaminophen Other (See Comments)    Other reaction(s): "get crazy"   Metformin Hcl Other (See Comments)    Other reaction(s): upset stomach   Tramadol Hcl Other (See Comments)    Other reaction(s): "get crazy"   Azor [Amlodipine-Olmesartan] Swelling, Palpitations and Rash    Amlodipine alone resumed 12/2013, tolerating   Lisinopril Cough   Penicillins Dermatitis and Other (See Comments)    Did it involve swelling of the face/tongue/throat, SOB, or low BP? No, just worsened eczema Did it involve sudden or severe rash/hives, skin peeling, or any reaction on the inside of your mouth or nose? No Did you need to seek medical attention at a hospital or doctor's office? No When did it last happen? "More than 10 years ago" If all above answers are "NO", may proceed with cephalosporin use.       Vital Signs: Temp: 98.3 F (36.8 C) (08/17 0024) Temp Source: Oral (08/17 0024) BP: 147/65 (08/17 0024) Pulse Rate: 65 (08/17 0024)  Labs: Recent Labs    01/10/21 0447 01/10/21 0820 01/10/21 0939 01/10/21 1110 01/10/21 1926 01/10/21 1937 01/11/21 0332 01/12/21 0129  HGB 13.7  --   --   --   --   --  12.7 12.7  HCT 42.5  --   --   --   --   --  39.5 38.9  PLT 235  --   --   --   --   --  196 174  HEPARINUNFRC  --    < >  --   --  0.47  --  0.46 0.11*  CREATININE  --   --  1.36*  --   --   --  1.31*  1.18*  TROPONINIHS  --   --  6,501* 6,571*  --  5,766* 4,581*  --    < > = values in this interval not displayed.     Assessment: 80 y/o F with chest pain to start heparin. CBC good, noted renal dysfunction. PTA meds reviewed.   8/17 AM update:  Heparin level low after re-start s/p cath, drawn a little early Pt declining CABG  Goal of Therapy:  Heparin level 0.3-0.7 units/ml Monitor platelets by anticoagulation protocol: Yes   Plan:  Inc heparin to 800 units/hr 1200 heparin level  Narda Bonds, PharmD, BCPS Clinical Pharmacist Phone: [phone]

## 2021-01-12 NOTE — Progress Notes (Signed)
PROGRESS NOTE    [name]  VOJ:[id number] DOB: [birth year] DOA: 01/09/2021 PCP: [name], MD    Chief Complaint  Patient presents with   Chest Pain   Emesis    Brief Narrative:   [name] is an 80 year old female patient with a past medical history significant for an abdominal aortic aneurysm repair in 2012, diabetes mellitus type 2, hypertension, hyperlipidemia, acute CVA  in 2016, known coronary artery disease with a stent placed in 2015 in the RCA, femoral-popliteal bypass graft done in 2016, GERD, arthritis, current pack a day cigarette smoker, and chronic kidney disease stage III who presented to the emergency department with chest pain, her work-up significant for NSTEMI, she went for cardiac cath 8/16, which was significant for multivessel disease, CT surgery consulted regarding CABG. Assessment & Plan:   Principal Problem:   NSTEMI (non-ST elevated myocardial infarction) ([place]) Active Problems:   Hyperlipidemia   Hypertension   DM (diabetes mellitus) type II uncontrolled, periph vascular disorder (HCC)   CKD (chronic kidney disease) stage 3, GFR 30-59 ml/min (HCC)   CAD (coronary artery disease)   NSTEMI (non-ST elevated myocardial infarction)  -Since findings significant for NSTEMI, this postcardiac cath 8/16, significant for multivessel disease, CT surgery consulted regarding CABG. -For now continue with medical management including aspirin, beta-blockers and heparin gtt.   -Patient remains with chest pain even at rest, unstable angina, started on nitro drip, she is kept on heparin drip as well.  Her medical management has been optimized. -Dr. Daneen Schick was consulted regarding second opinion concerning suitability for high risk PCI, it was determined patient will be better served with CABG, currently patient is agreeable, so CT surgery was reconsulted.    Hyperlipidemia Pt has allergy to lipitor documented. Will try pravachol to see if tolerates better.       Hypertension Continue norvasc, metoprolol, spironolactone, Imdur, she is currently on nitro drip as well.     DM (diabetes mellitus) type II uncontrolled, periph vascular disorder -, Poorly controlled at 9.7, but this is better than previous value at 12 last year. -For now continue with insulin sliding scale    CKD (chronic kidney disease) stage 3 B Stable. Monitor electrolytes and renal function in am with labs.      CAD (coronary artery disease) Chronic. Pt had cardiac stent placed 3 years ago she states and takes her aspirin daily.       Tobacco use Nicotine patch to prevent withdrawls. Pt encouraged to quit tobacco use for her health.     DVT prophylaxis: on heparin GTT Code Status: Full Family Communication: Discussed with the patient, none at bedside Disposition:   Status is: Inpatient  Remains inpatient appropriate because:IV treatments appropriate due to intensity of illness or inability to take PO  Dispo: The patient is from: Home              Anticipated d/c is to: Home              Patient currently is not medically stable to d/c.   Difficult to place patient No       Consultants:  Cardiology CT surgery   Subjective:  Continues to have intermittent chest pain at rest,  Objective: Vitals:   01/12/21 0757 01/12/21 0806 01/12/21 0810 01/12/21 0813  BP: (!) 166/84  (!) 144/69 (!) 144/69  Pulse: 70 69 83 79  Resp: 20 19  (!) 22  Temp: 98.6 F (37 C)     TempSrc: Oral  SpO2: 96% 95%  95%  Weight:      Height:       No intake or output data in the 24 hours ending 01/12/21 1625  Filed Weights   01/10/21 0900 01/10/21 2003  Weight: 74 kg 74 kg    Examination: Awake Alert, Oriented X 3, No new F.N deficits, Normal affect Symmetrical Chest wall movement, Good air movement bilaterally, CTAB RRR,No Gallops,Rubs or new Murmurs, No Parasternal Heave +ve B.Sounds, Abd Soft, No tenderness, No rebound - guarding or rigidity. No Cyanosis, Clubbing  or edema, No new Rash or bruise      Data Reviewed: I have personally reviewed following labs and imaging studies  CBC: Recent Labs  Lab 01/09/21 2230 01/10/21 0447 01/11/21 0332 01/12/21 0129  WBC 8.0 7.9 6.8 7.3  HGB 14.9 13.7 12.7 12.7  HCT 45.1 42.5 39.5 38.9  MCV 90.0 90.4 90.6 89.6  PLT 188 235 196 440    Basic Metabolic Panel: Recent Labs  Lab 01/09/21 2230 01/10/21 0939 01/11/21 0332 01/12/21 0129  NA 138 137 137 139  K 4.6 4.2 4.1 4.0  CL 104 106 106 111  CO2 25 23 23  19*  GLUCOSE 245* 171* 223* 135*  BUN 23 23 19 13   CREATININE 1.54* 1.36* 1.31* 1.18*  CALCIUM 9.9 9.1 9.0 8.8*    GFR: Estimated Creatinine Clearance: 39.3 mL/min (A) (by C-G formula based on SCr of 1.18 mg/dL (H)).  Liver Function Tests: No results for input(s): AST, ALT, ALKPHOS, BILITOT, PROT, ALBUMIN in the last 168 hours.  CBG: Recent Labs  Lab 01/11/21 1611 01/11/21 2141 01/12/21 0537 01/12/21 0735 01/12/21 1123  GLUCAP 91 189* 124* 145* 124*     Recent Results (from the past 240 hour(s))  Resp Panel by RT-PCR (Flu A&B, Covid) Nasopharyngeal Swab     Status: None   Collection Time: 01/09/21 12:18 AM   Specimen: Nasopharyngeal Swab; Nasopharyngeal(NP) swabs in vial transport medium  Result Value Ref Range Status   SARS Coronavirus 2 by RT PCR NEGATIVE NEGATIVE Final    Comment: (NOTE) SARS-CoV-2 target nucleic acids are NOT DETECTED.  The SARS-CoV-2 RNA is generally detectable in upper respiratory specimens during the acute phase of infection. The lowest concentration of SARS-CoV-2 viral copies this assay can detect is 138 copies/mL. A negative result does not preclude SARS-Cov-2 infection and should not be used as the sole basis for treatment or other patient management decisions. A negative result may occur with  improper specimen collection/handling, submission of specimen other than nasopharyngeal swab, presence of viral mutation(s) within the areas targeted by  this assay, and inadequate number of viral copies(<138 copies/mL). A negative result must be combined with clinical observations, patient history, and epidemiological information. The expected result is Negative.  Fact Sheet for Patients:  EntrepreneurPulse.com.au  Fact Sheet for Healthcare Providers:  IncredibleEmployment.be  This test is no t yet approved or cleared by the [place] FDA and  has been authorized for detection and/or diagnosis of SARS-CoV-2 by FDA under an Emergency Use Authorization (EUA). This EUA will remain  in effect (meaning this test can be used) for the duration of the COVID-19 declaration under Section 564(b)(1) of the Act, 21 U.S.C.section 360bbb-3(b)(1), unless the authorization is terminated  or revoked sooner.       Influenza A by PCR NEGATIVE NEGATIVE Final   Influenza B by PCR NEGATIVE NEGATIVE Final    Comment: (NOTE) The Xpert Xpress SARS-CoV-2/FLU/RSV plus assay is intended as an aid in the  diagnosis of influenza from Nasopharyngeal swab specimens and should not be used as a sole basis for treatment. Nasal washings and aspirates are unacceptable for Xpert Xpress SARS-CoV-2/FLU/RSV testing.  Fact Sheet for Patients: EntrepreneurPulse.com.au  Fact Sheet for Healthcare Providers: IncredibleEmployment.be  This test is not yet approved or cleared by the [place] FDA and has been authorized for detection and/or diagnosis of SARS-CoV-2 by FDA under an Emergency Use Authorization (EUA). This EUA will remain in effect (meaning this test can be used) for the duration of the COVID-19 declaration under Section 564(b)(1) of the Act, 21 U.S.C. section 360bbb-3(b)(1), unless the authorization is terminated or revoked.  Performed at [facility], [place] [address] [place], [place] [address]          Radiology Studies: CARDIAC CATHETERIZATION  Result Date:  01/11/2021   Prox RCA lesion is 85% stenosed.   Ost LM lesion is 80% stenosed.   Ost LAD lesion is 90% stenosed.   Prox LAD lesion is 80% stenosed.   Mid LAD to Dist LAD lesion is 70% stenosed.   Prox Cx lesion is 90% stenosed.   2nd Mrg lesion is 60% stenosed.   3rd Mrg lesion is 70% stenosed.   Previously placed Prox RCA to Mid RCA stent (unknown type) is  widely patent.        Scheduled Meds:  amLODipine  5 mg Oral Daily   aspirin EC  81 mg Oral Daily   atorvastatin  80 mg Oral Daily   insulin aspart  0-5 Units Subcutaneous QHS   insulin aspart  0-9 Units Subcutaneous TID WC   metoprolol tartrate  25 mg Oral BID   nicotine  14 mg Transdermal Daily   [START ON 01/13/2021] pantoprazole  40 mg Oral Daily   sodium chloride flush  3 mL Intravenous Q12H   sodium chloride flush  3 mL Intravenous Q12H   Continuous Infusions:  sodium chloride     heparin 950 Units/hr (01/12/21 1404)   lactated ringers Stopped (01/11/21 0906)   nitroGLYCERIN 10 mcg/min (01/12/21 1129)     LOS: 2 days     Phillips Climes, MD [facility]   To contact the attending provider between 7A-7P or the covering provider during after hours 7P-7A, please log into the web site [email or web address] and access using universal [place] password for that web site. If you do not have the password, please call the hospital operator.  01/12/2021, 4:25 PM

## 2021-01-12 NOTE — Progress Notes (Signed)
Notified covering provider about the patient's verbalizing feeling weight on her left side. Her VS's remained at her baseline. An EKG was done with no changes from the previous EKG administered.   Patient also complained of nausea. Provider was made aware as well.   No additional orders were given. Continuing to monitor patient for safety.

## 2021-01-12 NOTE — Progress Notes (Signed)
Notified Dr. Emeline Gins about the pt complaining about Left under breast pain 8/10, radiating towards neck snd intermittent sharp pain in head. EKG done. Administered SLG Nitro X2. Pt pain free.

## 2021-01-13 DIAGNOSIS — E1165 Type 2 diabetes mellitus with hyperglycemia: Secondary | ICD-10-CM

## 2021-01-13 DIAGNOSIS — E1151 Type 2 diabetes mellitus with diabetic peripheral angiopathy without gangrene: Secondary | ICD-10-CM

## 2021-01-13 DIAGNOSIS — I214 Non-ST elevation (NSTEMI) myocardial infarction: Secondary | ICD-10-CM | POA: Diagnosis not present

## 2021-01-13 DIAGNOSIS — I25110 Atherosclerotic heart disease of native coronary artery with unstable angina pectoris: Secondary | ICD-10-CM | POA: Diagnosis not present

## 2021-01-13 LAB — CBC
HCT: 42 % (ref 36.0–46.0)
Hemoglobin: 14 g/dL (ref 12.0–15.0)
MCH: 29.5 pg (ref 26.0–34.0)
MCHC: 33.3 g/dL (ref 30.0–36.0)
MCV: 88.4 fL (ref 80.0–100.0)
Platelets: 172 10*3/uL (ref 150–400)
RBC: 4.75 MIL/uL (ref 3.87–5.11)
RDW: 14.3 % (ref 11.5–15.5)
WBC: 11 10*3/uL — ABNORMAL HIGH (ref 4.0–10.5)
nRBC: 0 % (ref 0.0–0.2)

## 2021-01-13 LAB — BASIC METABOLIC PANEL
Anion gap: 14 (ref 5–15)
BUN: 15 mg/dL (ref 8–23)
CO2: 22 mmol/L (ref 22–32)
Calcium: 9.5 mg/dL (ref 8.9–10.3)
Chloride: 102 mmol/L (ref 98–111)
Creatinine, Ser: 1.57 mg/dL — ABNORMAL HIGH (ref 0.44–1.00)
GFR, Estimated: 33 mL/min — ABNORMAL LOW (ref >60)
Glucose, Bld: 160 mg/dL — ABNORMAL HIGH (ref 70–99)
Potassium: 4.5 mmol/L (ref 3.5–5.1)
Sodium: 138 mmol/L (ref 135–145)

## 2021-01-13 LAB — GLUCOSE, CAPILLARY
Glucose-Capillary: 154 mg/dL — ABNORMAL HIGH (ref 70–99)
Glucose-Capillary: 183 mg/dL — ABNORMAL HIGH (ref 70–99)
Glucose-Capillary: 174 mg/dL — ABNORMAL HIGH (ref 70–99)
Glucose-Capillary: 167 mg/dL — ABNORMAL HIGH (ref 70–99)

## 2021-01-13 LAB — HEPARIN LEVEL (UNFRACTIONATED): Heparin Unfractionated: 0.5 IU/mL (ref 0.30–0.70)

## 2021-01-13 MED ORDER — SODIUM CHLORIDE 0.9 % IV SOLN: INTRAVENOUS | Status: AC

## 2021-01-13 NOTE — Progress Notes (Signed)
     [address]       [place] [address]             [phone]       Discussed case with Ms. Baze again.  She would like to proceed with surgery.  DNR will need to be rescinded for at least 30 days following the operation. Will also need to plan for placement following recovery given that she lives alone.  Tentatively scheduled for early next week.  Enrrique Mierzwa Bary Leriche

## 2021-01-13 NOTE — Progress Notes (Signed)
ANTICOAGULATION CONSULT NOTE - Follow Up Consult  Pharmacy Consult for Heparin Indication: chest pain/ACS  Allergies  Allergen Reactions   Keflex [Cephalexin] Shortness Of Breath and Other (See Comments)    Chest pain, headache   Lipitor [Atorvastatin] Shortness Of Breath and Other (See Comments)    Severe pain/cramping in legs- "I cannot walk, talk, or breathe" (pt is currently taking 20 mg daily 10/11/19)   Codeine Other (See Comments)    Disrupted patient's equilibrium- "Made my world flip upside down"   Hydrochlorothiazide Other (See Comments)    Other reaction(s): lightheaded, nausea   Hydrocodone-Acetaminophen Other (See Comments)    Other reaction(s): "get crazy"   Metformin Hcl Other (See Comments)    Other reaction(s): upset stomach   Tramadol Hcl Other (See Comments)    Other reaction(s): "get crazy"   Azor [Amlodipine-Olmesartan] Swelling, Palpitations and Rash    Amlodipine alone resumed 12/2013, tolerating   Lisinopril Cough   Penicillins Dermatitis and Other (See Comments)    Did it involve swelling of the face/tongue/throat, SOB, or low BP? No, just worsened eczema Did it involve sudden or severe rash/hives, skin peeling, or any reaction on the inside of your mouth or nose? No Did you need to seek medical attention at a hospital or doctor's office? No When did it last happen? "More than 10 years ago" If all above answers are "NO", may proceed with cephalosporin use.       Vital Signs: Temp: 98.1 F (36.7 C) (08/18 0317) Temp Source: Oral (08/18 0317) BP: 118/61 (08/18 0931) Pulse Rate: 77 (08/18 0931)  Labs: Recent Labs    01/10/21 1926 01/10/21 1937 01/11/21 0332 01/12/21 0129 01/12/21 1136 01/12/21 2134 01/13/21 0406  HGB   < >  --  12.7 12.7  --   --  14.0  HCT  --   --  39.5 38.9  --   --  42.0  PLT  --   --  196 174  --   --  172  HEPARINUNFRC  --   --  0.46 0.11* 0.20* 0.34 0.50  CREATININE  --   --  1.31* 1.18*  --   --  1.57*  TROPONINIHS   --  5,766* 4,581*  --   --   --   --    < > = values in this interval not displayed.    Assessment: 80 y/o F with chest pain now s/p cath with multivessel CAD. Plans for CABG.  Heparin was restarted s/p cath -heparin level = 0.5 on 950 units.hr   Goal of Therapy:  Heparin level 0.3-0.7 units/ml Monitor platelets by anticoagulation protocol: Yes   Plan:  -Continue heparin 50 units/hr -Heparin level daily wth CBC daily -Anticipate CABG next week (last dose of plavix was 8/16Hildred Laser, PharmD Clinical Pharmacist **Pharmacist phone directory can now be found on [email or web address] ([email or web address]).  Listed under [place].

## 2021-01-13 NOTE — Plan of Care (Signed)
  Problem: Coping: Goal: Level of anxiety will decrease Outcome: Progressing   Problem: Elimination: Goal: Will not experience complications related to bowel motility Outcome: Completed/Met Goal: Will not experience complications related to urinary retention Outcome: Completed/Met   Problem: Pain Managment: Goal: General experience of comfort will improve Outcome: Progressing   Problem: Safety: Goal: Ability to remain free from injury will improve Outcome: Progressing   Problem: Skin Integrity: Goal: Risk for impaired skin integrity will decrease Outcome: Completed/Met

## 2021-01-13 NOTE — Progress Notes (Signed)
Subjective:  Appreciate all consultants help.  Patient presently denies any chest pain or shortness of breath remains on IV heparin and nitroglycerin.  Patient has agreed to proceed with CABG and will be scheduled probably early next week.as per patient was seen by CVTS earlier  Objective:  Vital Signs in the last 24 hours: Temp:  [98.1 F (36.7 C)-98.4 F (36.9 C)] 98.1 F (36.7 C) (08/18 0317) Pulse Rate:  [61-77] 77 (08/18 0931) Resp:  [18-20] 20 (08/18 0317) BP: (112-153)/(55-74) 118/61 (08/18 0931) SpO2:  [92 %-95 %] 95 % (08/18 0317)  Intake/Output from previous day: 08/17 0701 - 08/18 0700 In: 1208.6 [P.O.:960; I.V.:248.6] Out: 300 [Urine:300] Intake/Output from this shift: No intake/output data recorded.  Physical Exam: Neck: no adenopathy, no carotid bruit, no JVD, and supple, symmetrical, trachea midline Lungs: clear to auscultation bilaterally Heart: regular rate and rhythm, S1, S2 normal, and 2/6 systolic murmur Abdomen: soft, non-tender; bowel sounds normal; no masses,  no organomegaly Extremities: extremities normal, atraumatic, no cyanosis or edema and right groin stable  Lab Results: Recent Labs    01/12/21 0129 01/13/21 0406  WBC 7.3 11.0*  HGB 12.7 14.0  PLT 174 172   Recent Labs    01/12/21 0129 01/13/21 0406  NA 139 138  K 4.0 4.5  CL 111 102  CO2 19* 22  GLUCOSE 135* 160*  BUN 13 15  CREATININE 1.18* 1.57*   No results for input(s): TROPONINI in the last 72 hours.  Invalid input(s): CK, MB Hepatic Function Panel No results for input(s): PROT, ALBUMIN, AST, ALT, ALKPHOS, BILITOT, BILIDIR, IBILI in the last 72 hours. No results for input(s): CHOL in the last 72 hours. No results for input(s): PROTIME in the last 72 hours.  Imaging: No results found.  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  scheduled for CABG in the future History of inferolateral STEMI  in November 2015.  Status post PCI to RCA in November 2015 Hypertension. Hyperlipidemia. Diabetes mellitus. History of CVA. Peripheral vascular disease, history of renal stenting and femoral popliteal bypass in the past. 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 Chronic kidney disease, stage IIIB, improved. Tobacco abuse. Plan Continue present management.   LOS: 3 days    [name] 01/13/2021, 10:50 AM

## 2021-01-13 NOTE — Progress Notes (Addendum)
PROGRESS NOTE    [name]  SNK:[id number] DOB: 11-19-1940 DOA: 01/09/2021 PCP: [name], MD    Chief Complaint  Patient presents with   Chest Pain   Emesis    Brief Narrative:   [name] is an 80 year old female patient with a past medical history significant for an abdominal aortic aneurysm repair in 2012, diabetes mellitus type 2, hypertension, hyperlipidemia, acute CVA  in 2016, known coronary artery disease with a stent placed in 2015 in the RCA, femoral-popliteal bypass graft done in 2016, GERD, arthritis, current pack a day cigarette smoker, and chronic kidney disease stage III who presented to the emergency department with chest pain, her work-up significant for NSTEMI, she went for cardiac cath 8/16, which was significant for multivessel disease, CT surgery consulted regarding CABG.  Assessment & Plan:   Principal Problem:   NSTEMI (non-ST elevated myocardial infarction) ([facility]) Active Problems:   Hyperlipidemia   Hypertension   DM (diabetes mellitus) type II uncontrolled, periph vascular disorder (HCC)   CKD (chronic kidney disease) stage 3, GFR 30-59 ml/min (HCC)   CAD (coronary artery disease)   NSTEMI (non-ST elevated myocardial infarction)  -Since findings significant for NSTEMI, this postcardiac cath 8/16, significant for multivessel disease, CT surgery consulted regarding CABG. -For now continue with medical management including aspirin, beta-blockers and heparin gtt.   -Patient was started on nitro drip given continued chest pain even at rest, unstable angina, this appears to be resolved currently on nitro drip and heparin gtt. . -Dr. Daneen Schick was consulted regarding second opinion concerning suitability for high risk PCI, it was determined patient will be better served with CABG, currently patient is agreeable, so CT surgery was reconsulted.  Awaiting final recommendation and plan from CT surgery.    Hyperlipidemia Continue with statine      Hypertension Continue norvasc, metoprolol, spironolactone, Imdur, she is currently on nitro drip as well.     DM (diabetes mellitus) type II uncontrolled, periph vascular disorder -, Poorly controlled at 9.7, but this is better than previous value at 12 last year. -For now continue with insulin sliding scale    CKD (chronic kidney disease) stage 3 B Stable. Monitor electrolytes and renal function in am with labs.      CAD (coronary artery disease) Chronic. Pt had cardiac stent placed 3 years ago she states and takes her aspirin daily.       Tobacco use Nicotine patch to prevent withdrawls. Pt encouraged to quit tobacco use for her health.     DVT prophylaxis: on heparin GTT Code Status: Patient is DNR, she confirmed this today, she requested to be DNR, patient is aware DNR status will be reversed around her CABG surgery. Family Communication: Discussed with the patient, none at bedside Disposition:   Status is: Inpatient  Remains inpatient appropriate because:IV treatments appropriate due to intensity of illness or inability to take PO  Dispo: The patient is from: Home              Anticipated d/c is to: Home              Patient currently is not medically stable to d/c.   Difficult to place patient No       Consultants:  Cardiology CT surgery   Subjective:  She denies any chest pain at rest currently, but she does report significant fatigue, some dyspnea with minimal activity.  Objective: Vitals:   01/12/21 2205 01/13/21 0317 01/13/21 0931 01/13/21 1343  BP: 138/65 112/61  118/61 126/62  Pulse: 75 61 77 68  Resp:  20  19  Temp:  98.1 F (36.7 C)  98.2 F (36.8 C)  TempSrc:  Oral  Oral  SpO2:  95%  95%  Weight:      Height:        Intake/Output Summary (Last 24 hours) at 01/13/2021 1511 Last data filed at 01/13/2021 1401 Gross per 24 hour  Intake 968.56 ml  Output --  Net 968.56 ml    Filed Weights   01/10/21 0900 01/10/21 2003  Weight: 74 kg 74 kg     Examination:  Awake Alert, Oriented X 3, No new F.N deficits, Normal affect Symmetrical Chest wall movement, Good air movement bilaterally, CTAB RRR,No Gallops,Rubs or new Murmurs, No Parasternal Heave +ve B.Sounds, Abd Soft, No tenderness, No rebound - guarding or rigidity. No Cyanosis, Clubbing or edema, No new Rash or bruise      Data Reviewed: I have personally reviewed following labs and imaging studies  CBC: Recent Labs  Lab 01/09/21 2230 01/10/21 0447 01/11/21 0332 01/12/21 0129 01/13/21 0406  WBC 8.0 7.9 6.8 7.3 11.0*  HGB 14.9 13.7 12.7 12.7 14.0  HCT 45.1 42.5 39.5 38.9 42.0  MCV 90.0 90.4 90.6 89.6 88.4  PLT 188 235 196 174 638    Basic Metabolic Panel: Recent Labs  Lab 01/09/21 2230 01/10/21 0939 01/11/21 0332 01/12/21 0129 01/13/21 0406  NA 138 137 137 139 138  K 4.6 4.2 4.1 4.0 4.5  CL 104 106 106 111 102  CO2 25 23 23  19* 22  GLUCOSE 245* 171* 223* 135* 160*  BUN 23 23 19 13 15   CREATININE 1.54* 1.36* 1.31* 1.18* 1.57*  CALCIUM 9.9 9.1 9.0 8.8* 9.5    GFR: Estimated Creatinine Clearance: 29.5 mL/min (A) (by C-G formula based on SCr of 1.57 mg/dL (H)).  Liver Function Tests: No results for input(s): AST, ALT, ALKPHOS, BILITOT, PROT, ALBUMIN in the last 168 hours.  CBG: Recent Labs  Lab 01/12/21 1123 01/12/21 1632 01/12/21 2152 01/13/21 0755 01/13/21 1104  GLUCAP 124* 121* 147* 154* 183*     Recent Results (from the past 240 hour(s))  Resp Panel by RT-PCR (Flu A&B, Covid) Nasopharyngeal Swab     Status: None   Collection Time: 01/09/21 12:18 AM   Specimen: Nasopharyngeal Swab; Nasopharyngeal(NP) swabs in vial transport medium  Result Value Ref Range Status   SARS Coronavirus 2 by RT PCR NEGATIVE NEGATIVE Final    Comment: (NOTE) SARS-CoV-2 target nucleic acids are NOT DETECTED.  The SARS-CoV-2 RNA is generally detectable in upper respiratory specimens during the acute phase of infection. The lowest concentration of  SARS-CoV-2 viral copies this assay can detect is 138 copies/mL. A negative result does not preclude SARS-Cov-2 infection and should not be used as the sole basis for treatment or other patient management decisions. A negative result may occur with  improper specimen collection/handling, submission of specimen other than nasopharyngeal swab, presence of viral mutation(s) within the areas targeted by this assay, and inadequate number of viral copies(<138 copies/mL). A negative result must be combined with clinical observations, patient history, and epidemiological information. The expected result is Negative.  Fact Sheet for Patients:  EntrepreneurPulse.com.au  Fact Sheet for Healthcare Providers:  IncredibleEmployment.be  This test is no t yet approved or cleared by the [place] FDA and  has been authorized for detection and/or diagnosis of SARS-CoV-2 by FDA under an Emergency Use Authorization (EUA). This EUA will remain  in effect (  meaning this test can be used) for the duration of the COVID-19 declaration under Section 564(b)(1) of the Act, 21 U.S.C.section 360bbb-3(b)(1), unless the authorization is terminated  or revoked sooner.       Influenza A by PCR NEGATIVE NEGATIVE Final   Influenza B by PCR NEGATIVE NEGATIVE Final    Comment: (NOTE) The Xpert Xpress SARS-CoV-2/FLU/RSV plus assay is intended as an aid in the diagnosis of influenza from Nasopharyngeal swab specimens and should not be used as a sole basis for treatment. Nasal washings and aspirates are unacceptable for Xpert Xpress SARS-CoV-2/FLU/RSV testing.  Fact Sheet for Patients: EntrepreneurPulse.com.au  Fact Sheet for Healthcare Providers: IncredibleEmployment.be  This test is not yet approved or cleared by the [place] FDA and has been authorized for detection and/or diagnosis of SARS-CoV-2 by FDA under an Emergency Use  Authorization (EUA). This EUA will remain in effect (meaning this test can be used) for the duration of the COVID-19 declaration under Section 564(b)(1) of the Act, 21 U.S.C. section 360bbb-3(b)(1), unless the authorization is terminated or revoked.  Performed at [facility], [place] [address], [place], [place] [address]          Radiology Studies: No results found.      Scheduled Meds:  amLODipine  5 mg Oral Daily   aspirin EC  81 mg Oral Daily   atorvastatin  80 mg Oral Daily   insulin aspart  0-5 Units Subcutaneous QHS   insulin aspart  0-9 Units Subcutaneous TID WC   metoprolol tartrate  25 mg Oral BID   nicotine  14 mg Transdermal Daily   pantoprazole  40 mg Oral Daily   sodium chloride flush  3 mL Intravenous Q12H   sodium chloride flush  3 mL Intravenous Q12H   Continuous Infusions:  sodium chloride     sodium chloride 50 mL/hr at 01/13/21 1100   heparin 950 Units/hr (01/13/21 0302)   lactated ringers Stopped (01/11/21 0906)   nitroGLYCERIN 10 mcg/min (01/12/21 1129)     LOS: 3 days     Phillips Climes, MD [facility]   To contact the attending provider between 7A-7P or the covering provider during after hours 7P-7A, please log into the web site [email or web address] and access using universal [place] password for that web site. If you do not have the password, please call the hospital operator.  01/13/2021, 3:11 PM

## 2021-01-13 NOTE — Progress Notes (Signed)
[facility] for Heparin Indication: chest pain/ACS  Allergies  Allergen Reactions   Keflex [Cephalexin] Shortness Of Breath and Other (See Comments)    Chest pain, headache   Lipitor [Atorvastatin] Shortness Of Breath and Other (See Comments)    Severe pain/cramping in legs- "I cannot walk, talk, or breathe" (pt is currently taking 20 mg daily 10/11/19)   Codeine Other (See Comments)    Disrupted patient's equilibrium- "Made my world flip upside down"   Hydrochlorothiazide Other (See Comments)    Other reaction(s): lightheaded, nausea   Hydrocodone-Acetaminophen Other (See Comments)    Other reaction(s): "get crazy"   Metformin Hcl Other (See Comments)    Other reaction(s): upset stomach   Tramadol Hcl Other (See Comments)    Other reaction(s): "get crazy"   Azor [Amlodipine-Olmesartan] Swelling, Palpitations and Rash    Amlodipine alone resumed 12/2013, tolerating   Lisinopril Cough   Penicillins Dermatitis and Other (See Comments)    Did it involve swelling of the face/tongue/throat, SOB, or low BP? No, just worsened eczema Did it involve sudden or severe rash/hives, skin peeling, or any reaction on the inside of your mouth or nose? No Did you need to seek medical attention at a hospital or doctor's office? No When did it last happen? "More than 10 years ago" If all above answers are "NO", may proceed with cephalosporin use.       Vital Signs: Temp: 98.1 F (36.7 C) (08/18 0317) Temp Source: Oral (08/18 0317) BP: 112/61 (08/18 0317) Pulse Rate: 61 (08/18 0317)  Labs: Recent Labs    01/10/21 1110 01/10/21 1926 01/10/21 1937 01/11/21 0332 01/12/21 0129 01/12/21 1136 01/12/21 2134 01/13/21 0406  HGB  --    < >  --  12.7 12.7  --   --  14.0  HCT  --   --   --  39.5 38.9  --   --  42.0  PLT  --   --   --  196 174  --   --  172  HEPARINUNFRC  --    < >  --  0.46 0.11* 0.20* 0.34 0.50  CREATININE  --   --   --  1.31* 1.18*  --   --   1.57*  TROPONINIHS 6,571*  --  5,766* 4,581*  --   --   --   --    < > = values in this interval not displayed.     Assessment: 80 y/o F with chest pain to start heparin. CBC good, noted renal dysfunction. PTA meds reviewed.   8/18 AM update:  Heparin level therapeutic x 2 Pt may be willing to consider CABG now  Goal of Therapy:  Heparin level 0.3-0.7 units/ml Monitor platelets by anticoagulation protocol: Yes   Plan:  Cont heparin 950 units/hr Daily CBC/heparin level Monitor for bleeding F/U plans for CABG  Narda Bonds, PharmD, BCPS Clinical Pharmacist Phone: [phone]

## 2021-01-14 ENCOUNTER — Inpatient Hospital Stay (HOSPITAL_COMMUNITY): Payer: Medicare HMO

## 2021-01-14 DIAGNOSIS — I25110 Atherosclerotic heart disease of native coronary artery with unstable angina pectoris: Secondary | ICD-10-CM | POA: Diagnosis not present

## 2021-01-14 DIAGNOSIS — I214 Non-ST elevation (NSTEMI) myocardial infarction: Secondary | ICD-10-CM | POA: Diagnosis not present

## 2021-01-14 LAB — CBC
HCT: 36.5 % (ref 36.0–46.0)
Hemoglobin: 11.9 g/dL — ABNORMAL LOW (ref 12.0–15.0)
MCH: 29.2 pg (ref 26.0–34.0)
MCHC: 32.6 g/dL (ref 30.0–36.0)
MCV: 89.5 fL (ref 80.0–100.0)
Platelets: 179 10*3/uL (ref 150–400)
RBC: 4.08 MIL/uL (ref 3.87–5.11)
RDW: 14.3 % (ref 11.5–15.5)
WBC: 9.3 10*3/uL (ref 4.0–10.5)
nRBC: 0 % (ref 0.0–0.2)

## 2021-01-14 LAB — GLUCOSE, CAPILLARY
Glucose-Capillary: 115 mg/dL — ABNORMAL HIGH (ref 70–99)
Glucose-Capillary: 194 mg/dL — ABNORMAL HIGH (ref 70–99)
Glucose-Capillary: 144 mg/dL — ABNORMAL HIGH (ref 70–99)
Glucose-Capillary: 151 mg/dL — ABNORMAL HIGH (ref 70–99)

## 2021-01-14 LAB — BASIC METABOLIC PANEL
Anion gap: 8 (ref 5–15)
BUN: 18 mg/dL (ref 8–23)
CO2: 22 mmol/L (ref 22–32)
Calcium: 8.5 mg/dL — ABNORMAL LOW (ref 8.9–10.3)
Chloride: 107 mmol/L (ref 98–111)
Creatinine, Ser: 1.6 mg/dL — ABNORMAL HIGH (ref 0.44–1.00)
GFR, Estimated: 32 mL/min — ABNORMAL LOW (ref >60)
Glucose, Bld: 144 mg/dL — ABNORMAL HIGH (ref 70–99)
Potassium: 3.9 mmol/L (ref 3.5–5.1)
Sodium: 137 mmol/L (ref 135–145)

## 2021-01-14 LAB — PREPARE RBC (CROSSMATCH)

## 2021-01-14 LAB — SURGICAL PCR SCREEN
MRSA, PCR: NEGATIVE
Staphylococcus aureus: NEGATIVE

## 2021-01-14 LAB — HEPARIN LEVEL (UNFRACTIONATED): Heparin Unfractionated: 0.4 IU/mL (ref 0.30–0.70)

## 2021-01-14 MED ORDER — MILRINONE LACTATE IN DEXTROSE 20-5 MG/100ML-% IV SOLN
0.3000 ug/kg/min | INTRAVENOUS | Status: DC
  Filled 2021-01-14: qty 100

## 2021-01-14 MED ORDER — METOPROLOL TARTRATE 12.5 MG HALF TABLET
12.5000 mg | ORAL_TABLET | Freq: Once | ORAL | Status: AC
  Administered 2021-01-17: 12.5 mg via ORAL
  Filled 2021-01-14 (×2): qty 1

## 2021-01-14 MED ORDER — EPINEPHRINE HCL 5 MG/250ML IV SOLN IN NS
0.0000 ug/min | INTRAVENOUS | Status: DC
  Filled 2021-01-14: qty 250

## 2021-01-14 MED ORDER — POTASSIUM CHLORIDE 2 MEQ/ML IV SOLN
80.0000 meq | INTRAVENOUS | Status: DC
  Filled 2021-01-14: qty 40

## 2021-01-14 MED ORDER — LEVOFLOXACIN IN D5W 500 MG/100ML IV SOLN
500.0000 mg | INTRAVENOUS | Status: AC
  Administered 2021-01-17: 500 mg via INTRAVENOUS
  Filled 2021-01-14: qty 100

## 2021-01-14 MED ORDER — MANNITOL 20 % IV SOLN
INTRAVENOUS | Status: DC
  Filled 2021-01-14: qty 13

## 2021-01-14 MED ORDER — TRANEXAMIC ACID (OHS) PUMP PRIME SOLUTION
2.0000 mg/kg | INTRAVENOUS | Status: DC
  Filled 2021-01-14: qty 1.48

## 2021-01-14 MED ORDER — SODIUM CHLORIDE 0.9 % IV SOLN
INTRAVENOUS | Status: DC
  Filled 2021-01-14: qty 30

## 2021-01-14 MED ORDER — TEMAZEPAM 15 MG PO CAPS: 15.0000 mg | ORAL_CAPSULE | Freq: Once | ORAL | Status: DC | PRN

## 2021-01-14 MED ORDER — PAPAVERINE HCL 30 MG/ML IJ SOLN
INTRAMUSCULAR | Status: DC
Start: 2021-01-17 — End: 2021-01-17
  Filled 2021-01-14 (×2): qty 5

## 2021-01-14 MED ORDER — DICLOFENAC SODIUM 1 % EX GEL
2.0000 g | Freq: Three times a day (TID) | CUTANEOUS | Status: DC
  Administered 2021-01-14 – 2021-01-16 (×6): 2 g via TOPICAL
  Filled 2021-01-14: qty 100

## 2021-01-14 MED ORDER — NOREPINEPHRINE 4 MG/250ML-% IV SOLN
0.0000 ug/min | INTRAVENOUS | Status: DC
  Filled 2021-01-14: qty 250

## 2021-01-14 MED ORDER — CHLORHEXIDINE GLUCONATE CLOTH 2 % EX PADS: 6.0000 | MEDICATED_PAD | Freq: Once | CUTANEOUS | Status: DC

## 2021-01-14 MED ORDER — TRANEXAMIC ACID (OHS) BOLUS VIA INFUSION
15.0000 mg/kg | INTRAVENOUS | Status: AC
  Administered 2021-01-17: 1110 mg via INTRAVENOUS
  Filled 2021-01-14: qty 1110

## 2021-01-14 MED ORDER — NITROGLYCERIN IN D5W 200-5 MCG/ML-% IV SOLN
2.0000 ug/min | INTRAVENOUS | Status: AC
  Administered 2021-01-17 (×2): 30 ug/min via INTRAVENOUS
  Filled 2021-01-14: qty 250

## 2021-01-14 MED ORDER — VANCOMYCIN HCL 1250 MG/250ML IV SOLN
1250.0000 mg | INTRAVENOUS | Status: DC
Start: 2021-01-17 — End: 2021-01-17
  Filled 2021-01-14: qty 250

## 2021-01-14 MED ORDER — DEXMEDETOMIDINE HCL IN NACL 400 MCG/100ML IV SOLN
0.1000 ug/kg/h | INTRAVENOUS | Status: AC
  Administered 2021-01-17: .4 ug/kg/h via INTRAVENOUS
  Filled 2021-01-14: qty 100

## 2021-01-14 MED ORDER — TRANEXAMIC ACID 1000 MG/10ML IV SOLN
1.5000 mg/kg/h | INTRAVENOUS | Status: AC
  Administered 2021-01-17: 1.5 mg/kg/h via INTRAVENOUS
  Filled 2021-01-14: qty 25

## 2021-01-14 MED ORDER — BISACODYL 5 MG PO TBEC
5.0000 mg | DELAYED_RELEASE_TABLET | Freq: Once | ORAL | Status: DC
  Filled 2021-01-14: qty 1

## 2021-01-14 MED ORDER — INSULIN REGULAR(HUMAN) IN NACL 100-0.9 UT/100ML-% IV SOLN
INTRAVENOUS | Status: AC
  Administered 2021-01-17: 2 [IU]/h via INTRAVENOUS
  Filled 2021-01-14: qty 100

## 2021-01-14 MED ORDER — LOPERAMIDE HCL 2 MG PO CAPS
2.0000 mg | ORAL_CAPSULE | ORAL | Status: DC | PRN
  Administered 2021-01-14 (×2): 2 mg via ORAL
  Filled 2021-01-14 (×2): qty 1

## 2021-01-14 MED ORDER — PHENYLEPHRINE HCL-NACL 20-0.9 MG/250ML-% IV SOLN
30.0000 ug/min | INTRAVENOUS | Status: AC
  Administered 2021-01-17: 10 ug/min via INTRAVENOUS
  Filled 2021-01-14: qty 250

## 2021-01-14 NOTE — Progress Notes (Signed)
Subjective:  Patient denies any chest pain or shortness of breath.  Awaiting for CABG  Objective:  Vital Signs in the last 24 hours: Temp:  [98.2 F (36.8 C)-98.4 F (36.9 C)] 98.2 F (36.8 C) (08/19 0500) Pulse Rate:  [61-68] 68 (08/19 0500) Resp:  [15-19] 18 (08/19 0500) BP: (99-132)/(46-62) 132/59 (08/19 0500) SpO2:  [93 %-95 %] 94 % (08/19 0500)  Intake/Output from previous day: 08/18 0701 - 08/19 0700 In: 1136.8 [P.O.:600; I.V.:536.8] Out: 800 [Urine:800] Intake/Output from this shift: No intake/output data recorded.  Physical Exam: Exam unchanged  Lab Results: Recent Labs    01/13/21 0406 01/14/21 0126  WBC 11.0* 9.3  HGB 14.0 11.9*  PLT 172 179   Recent Labs    01/13/21 0406 01/14/21 0126  NA 138 137  K 4.5 3.9  CL 102 107  CO2 22 22  GLUCOSE 160* 144*  BUN 15 18  CREATININE 1.57* 1.60*   No results for input(s): TROPONINI in the last 72 hours.  Invalid input(s): CK, MB Hepatic Function Panel No results for input(s): PROT, ALBUMIN, AST, ALT, ALKPHOS, BILITOT, BILIDIR, IBILI in the last 72 hours. No results for input(s): CHOL in the last 72 hours. No results for input(s): PROTIME in the last 72 hours.  Imaging: No results found.  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  scheduled for CABG in the future History of inferolateral STEMI in November 2015.  Status post PCI to RCA in November 2015 Hypertension. Hyperlipidemia. Diabetes mellitus. History of CVA. Peripheral vascular disease, history of renal stenting and femoral popliteal bypass in the past. 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 Chronic kidney disease, stage IIIB,  Tobacco abuse. Plan Continue present management  Dr. Doylene Canard on call for me for weekend  LOS: 4 days    [name] 01/14/2021, 12:12 PM

## 2021-01-14 NOTE — Progress Notes (Signed)
ANTICOAGULATION CONSULT NOTE - Follow Up Consult  Pharmacy Consult for Heparin Indication: chest pain/ACS  Allergies  Allergen Reactions   Keflex [Cephalexin] Shortness Of Breath and Other (See Comments)    Chest pain, headache   Lipitor [Atorvastatin] Shortness Of Breath and Other (See Comments)    Severe pain/cramping in legs- "I cannot walk, talk, or breathe" (pt is currently taking 20 mg daily 10/11/19)   Codeine Other (See Comments)    Disrupted patient's equilibrium- "Made my world flip upside down"   Hydrochlorothiazide Other (See Comments)    Other reaction(s): lightheaded, nausea   Hydrocodone-Acetaminophen Other (See Comments)    Other reaction(s): "get crazy"   Metformin Hcl Other (See Comments)    Other reaction(s): upset stomach   Tramadol Hcl Other (See Comments)    Other reaction(s): "get crazy"   Azor [Amlodipine-Olmesartan] Swelling, Palpitations and Rash    Amlodipine alone resumed 12/2013, tolerating   Lisinopril Cough   Penicillins Dermatitis and Other (See Comments)    Did it involve swelling of the face/tongue/throat, SOB, or low BP? No, just worsened eczema Did it involve sudden or severe rash/hives, skin peeling, or any reaction on the inside of your mouth or nose? No Did you need to seek medical attention at a hospital or doctor's office? No When did it last happen? "More than 10 years ago" If all above answers are "NO", may proceed with cephalosporin use.       Vital Signs: Temp: 98.2 F (36.8 C) (08/19 0500) Temp Source: Oral (08/19 0500) BP: 132/59 (08/19 0500) Pulse Rate: 68 (08/19 0500)  Labs: Recent Labs    01/12/21 0129 01/12/21 1136 01/12/21 2134 01/13/21 0406 01/14/21 0126  HGB 12.7  --   --  14.0 11.9*  HCT 38.9  --   --  42.0 36.5  PLT 174  --   --  172 179  HEPARINUNFRC 0.11*   < > 0.34 0.50 0.40  CREATININE 1.18*  --   --  1.57* 1.60*   < > = values in this interval not displayed.    Assessment: 80 y/o F with chest pain  now s/p cath with multivessel CAD. Plans for CABG next week.   -heparin level = 0.4 on 950 units.hr   Goal of Therapy:  Heparin level 0.3-0.7 units/ml Monitor platelets by anticoagulation protocol: Yes   Plan:  -Continue heparin 950 units/hr -Heparin level daily wth CBC daily  Hildred Laser, PharmD Clinical Pharmacist **Pharmacist phone directory can now be found on [email or web address] ([email or web address]).  Listed under [place].

## 2021-01-14 NOTE — Plan of Care (Signed)
  Problem: Education: Goal: Knowledge of General Education information will improve Description: Including pain rating scale, medication(s)/side effects and non-pharmacologic comfort measures Outcome: Progressing   Problem: Health Behavior/Discharge Planning: Goal: Ability to manage health-related needs will improve Outcome: Progressing   Problem: Clinical Measurements: Goal: Ability to maintain clinical measurements within normal limits will improve Outcome: Progressing Goal: Will remain free from infection Outcome: Progressing Goal: Diagnostic test results will improve Outcome: Progressing Goal: Respiratory complications will improve Outcome: Progressing Goal: Cardiovascular complication will be avoided Outcome: Progressing   Problem: Nutrition: Goal: Adequate nutrition will be maintained Outcome: Progressing   Problem: Coping: Goal: Level of anxiety will decrease Outcome: Progressing   Problem: Pain Managment: Goal: General experience of comfort will improve Outcome: Progressing   Problem: Safety: Goal: Ability to remain free from injury will improve Outcome: Progressing

## 2021-01-14 NOTE — Progress Notes (Signed)
Pt voices that she has had chest tightness with excessive talking. Will not ambulate. She did practice IS, max 500 ml. No angina noted by pt. Encouraged practice. Discussed sternal precautions. Pt voices that she needs to use her arms to stand due to prior CVA. She normally walks with rollator. She is interested in a Risk analyst. She lives alone and understands that she will need PT/OT post op and probably SNF at d/c. Gave materials to read but she needs her glasses. Set up preop video for her. No angina with me but telemetry is reading STE. Notified RN who will check on her. [place], ACSM 2:31 PM 01/14/2021

## 2021-01-14 NOTE — Progress Notes (Signed)
PROGRESS NOTE    [name]  OMB:[id number] DOB: 29-Jul-1940 DOA: 01/09/2021 PCP: [name], MD    Chief Complaint  Patient presents with   Chest Pain   Emesis    Brief Narrative:   [name] is an 80 year old female patient with a past medical history significant for an abdominal aortic aneurysm repair in 2012, diabetes mellitus type 2, hypertension, hyperlipidemia, acute CVA  in 2016, known coronary artery disease with a stent placed in 2015 in the RCA, femoral-popliteal bypass graft done in 2016, GERD, arthritis, current pack a day cigarette smoker, and chronic kidney disease stage III who presented to the emergency department with chest pain, her work-up significant for NSTEMI, she went for cardiac cath 8/16, which was significant for multivessel disease, CT surgery consulted regarding CABG. patient initially refusing CABG, Dr. Terrence Dupont consulted [place] regarding high risk PCI, it was felt that the best approach would be for patient to go for surgical intervention with CABG ,CT surgery reconsulted, and plan for CABG on Monday  Assessment & Plan:   Principal Problem:   NSTEMI (non-ST elevated myocardial infarction) ([place]) Active Problems:   Hyperlipidemia   Hypertension   DM (diabetes mellitus) type II uncontrolled, periph vascular disorder (HCC)   CKD (chronic kidney disease) stage 3, GFR 30-59 ml/min (HCC)   CAD (coronary artery disease)   NSTEMI (non-ST elevated myocardial infarction)  -Since findings significant for NSTEMI, this postcardiac cath 8/16, significant for multivessel disease, CT surgery consulted regarding CABG. -For now continue with medical management including aspirin, beta-blockers and heparin gtt.   -Patient was started on nitro drip given continued chest pain even at rest, unstable angina, this appears to be resolved currently on nitro drip and heparin gtt. . -Dr. Daneen Schick was consulted regarding second opinion concerning suitability for high risk  PCI, it was determined patient will be better served with CABG, currently patient is agreeable, so CT surgery was reconsulted.  Current plan for surgery on Monday.    Hyperlipidemia Continue with statine     Hypertension Continue norvasc, metoprolol, spironolactone, Imdur, she is currently on nitro drip as well.     DM (diabetes mellitus) type II uncontrolled, periph vascular disorder -, Poorly controlled at 9.7, but this is better than previous value at 12 last year. -For now continue with insulin sliding scale    CKD (chronic kidney disease) stage 3 B Stable. Monitor electrolytes and renal function in am with labs.      CAD (coronary artery disease) Chronic. Pt had cardiac stent placed 3 years ago she states and takes her aspirin daily.       Tobacco use Nicotine patch to prevent withdrawls. Pt encouraged to quit tobacco use for her health.   She will be started on Voltaren gel for her musculoskeletal right shoulder/neck pain    DVT prophylaxis: on heparin GTT Code Status: Patient is aware DNR has to be rescinded for Surgery, she is aware if code status change to full code,   and agreeable to this plan. Family Communication: Discussed with the patient, none at bedside, discussed with daughter [name] by phone Disposition:   Status is: Inpatient  Remains inpatient appropriate because:IV treatments appropriate due to intensity of illness or inability to take PO  Dispo: The patient is from: Home              Anticipated d/c is to: Home              Patient currently is not medically stable  to d/c.   Difficult to place patient No       Consultants:  Cardiology CT surgery   Subjective:  She complains of musculoskeletal chronic neck pain,   Objective: Vitals:   01/13/21 2017 01/13/21 2135 01/13/21 2307 01/14/21 0500  BP: (!) 104/51 (!) 99/46 (!) 118/54 (!) 132/59  Pulse:  61 64 68  Resp: 18 15 15 18   Temp: 98.4 F (36.9 C)   98.2 F (36.8 C)  TempSrc: Oral    Oral  SpO2: 93% 94% 94% 94%  Weight:      Height:        Intake/Output Summary (Last 24 hours) at 01/14/2021 1404 Last data filed at 01/14/2021 0651 Gross per 24 hour  Intake 896.82 ml  Output 800 ml  Net 96.82 ml    Filed Weights   01/10/21 0900 01/10/21 2003  Weight: 74 kg 74 kg    Examination:  Awake Alert, Oriented X 3, No new F.N deficits, Normal affect Symmetrical Chest wall movement, Good air movement bilaterally, CTAB RRR,No Gallops,Rubs or new Murmurs, No Parasternal Heave +ve B.Sounds, Abd Soft, No tenderness, No rebound - guarding or rigidity. No Cyanosis, Clubbing or edema, No new Rash or bruise       Data Reviewed: I have personally reviewed following labs and imaging studies  CBC: Recent Labs  Lab 01/10/21 0447 01/11/21 0332 01/12/21 0129 01/13/21 0406 01/14/21 0126  WBC 7.9 6.8 7.3 11.0* 9.3  HGB 13.7 12.7 12.7 14.0 11.9*  HCT 42.5 39.5 38.9 42.0 36.5  MCV 90.4 90.6 89.6 88.4 89.5  PLT 235 196 174 172 335    Basic Metabolic Panel: Recent Labs  Lab 01/10/21 0939 01/11/21 0332 01/12/21 0129 01/13/21 0406 01/14/21 0126  NA 137 137 139 138 137  K 4.2 4.1 4.0 4.5 3.9  CL 106 106 111 102 107  CO2 23 23 19* 22 22  GLUCOSE 171* 223* 135* 160* 144*  BUN 23 19 13 15 18   CREATININE 1.36* 1.31* 1.18* 1.57* 1.60*  CALCIUM 9.1 9.0 8.8* 9.5 8.5*    GFR: Estimated Creatinine Clearance: 29 mL/min (A) (by C-G formula based on SCr of 1.6 mg/dL (H)).  Liver Function Tests: No results for input(s): AST, ALT, ALKPHOS, BILITOT, PROT, ALBUMIN in the last 168 hours.  CBG: Recent Labs  Lab 01/13/21 1104 01/13/21 1529 01/13/21 2141 01/14/21 0745 01/14/21 1111  GLUCAP 183* 174* 167* 115* 194*     Recent Results (from the past 240 hour(s))  Resp Panel by RT-PCR (Flu A&B, Covid) Nasopharyngeal Swab     Status: None   Collection Time: 01/09/21 12:18 AM   Specimen: Nasopharyngeal Swab; Nasopharyngeal(NP) swabs in vial transport medium  Result Value  Ref Range Status   SARS Coronavirus 2 by RT PCR NEGATIVE NEGATIVE Final    Comment: (NOTE) SARS-CoV-2 target nucleic acids are NOT DETECTED.  The SARS-CoV-2 RNA is generally detectable in upper respiratory specimens during the acute phase of infection. The lowest concentration of SARS-CoV-2 viral copies this assay can detect is 138 copies/mL. A negative result does not preclude SARS-Cov-2 infection and should not be used as the sole basis for treatment or other patient management decisions. A negative result may occur with  improper specimen collection/handling, submission of specimen other than nasopharyngeal swab, presence of viral mutation(s) within the areas targeted by this assay, and inadequate number of viral copies(<138 copies/mL). A negative result must be combined with clinical observations, patient history, and epidemiological information. The expected result is Negative.  Fact Sheet for Patients:  EntrepreneurPulse.com.au  Fact Sheet for Healthcare Providers:  IncredibleEmployment.be  This test is no t yet approved or cleared by the [place] FDA and  has been authorized for detection and/or diagnosis of SARS-CoV-2 by FDA under an Emergency Use Authorization (EUA). This EUA will remain  in effect (meaning this test can be used) for the duration of the COVID-19 declaration under Section 564(b)(1) of the Act, 21 U.S.C.section 360bbb-3(b)(1), unless the authorization is terminated  or revoked sooner.       Influenza A by PCR NEGATIVE NEGATIVE Final   Influenza B by PCR NEGATIVE NEGATIVE Final    Comment: (NOTE) The Xpert Xpress SARS-CoV-2/FLU/RSV plus assay is intended as an aid in the diagnosis of influenza from Nasopharyngeal swab specimens and should not be used as a sole basis for treatment. Nasal washings and aspirates are unacceptable for Xpert Xpress SARS-CoV-2/FLU/RSV testing.  Fact Sheet for  Patients: EntrepreneurPulse.com.au  Fact Sheet for Healthcare Providers: IncredibleEmployment.be  This test is not yet approved or cleared by the [place] FDA and has been authorized for detection and/or diagnosis of SARS-CoV-2 by FDA under an Emergency Use Authorization (EUA). This EUA will remain in effect (meaning this test can be used) for the duration of the COVID-19 declaration under Section 564(b)(1) of the Act, 21 U.S.C. section 360bbb-3(b)(1), unless the authorization is terminated or revoked.  Performed at [facility], [place] [address] [place], [place] [address]   Surgical pcr screen     Status: None   Collection Time: 01/13/21  5:21 PM   Specimen: Nasal Mucosa; Nasal Swab  Result Value Ref Range Status   MRSA, PCR NEGATIVE NEGATIVE Final   Staphylococcus aureus NEGATIVE NEGATIVE Final    Comment: (NOTE) The Xpert SA Assay (FDA approved for NASAL specimens in patients 21 years of age and older), is one component of a comprehensive surveillance program. It is not intended to diagnose infection nor to guide or monitor treatment. Performed at [facility], [place] [address] [place], [place] [address]          Radiology Studies: No results found.      Scheduled Meds:  amLODipine  5 mg Oral Daily   aspirin EC  81 mg Oral Daily   atorvastatin  80 mg Oral Daily   [START ON 01/17/2021] epinephrine  0-10 mcg/min Intravenous To OR   [START ON 01/17/2021] heparin-papaverine-plasmalyte irrigation   Irrigation To OR   insulin aspart  0-5 Units Subcutaneous QHS   insulin aspart  0-9 Units Subcutaneous TID WC   [START ON 01/17/2021] insulin   Intravenous To OR   [START ON 01/17/2021] [place] Blood Cardioplegia vial (lidocaine/magnesium/mannitol 0.26g-4g-6.4g)   Intracoronary To OR   metoprolol tartrate  25 mg Oral BID   nicotine  14 mg Transdermal Daily   pantoprazole  40 mg Oral Daily   [START ON 01/17/2021]  phenylephrine  30-200 mcg/min Intravenous To OR   [START ON 01/17/2021] potassium chloride  80 mEq Other To OR   sodium chloride flush  3 mL Intravenous Q12H   sodium chloride flush  3 mL Intravenous Q12H   [START ON 01/17/2021] tranexamic acid  15 mg/kg Intravenous To OR   [START ON 01/17/2021] tranexamic acid  2 mg/kg Intracatheter To OR   Continuous Infusions:  sodium chloride     [START ON 01/17/2021] dexmedetomidine     [START ON 01/17/2021] heparin 30,000 units/NS 1000 mL solution for CELLSAVER     heparin  950 Units/hr (01/14/21 0704)   lactated ringers 50 mL/hr at 01/14/21 0726   [START ON 01/17/2021] levofloxacin (LEVAQUIN) IV     [START ON 01/17/2021] milrinone     nitroGLYCERIN 10 mcg/min (01/12/21 1129)   [START ON 01/17/2021] nitroGLYCERIN     [START ON 01/17/2021] norepinephrine     [START ON 01/17/2021] tranexamic acid (CYKLOKAPRON) infusion ([facility])     [START ON 01/17/2021] vancomycin       LOS: 4 days     Phillips Climes, MD [facility]   To contact the attending provider between 7A-7P or the covering provider during after hours 7P-7A, please log into the web site [email or web address] and access using universal [place] password for that web site. If you do not have the password, please call the hospital operator.  01/14/2021, 2:04 PM

## 2021-01-15 ENCOUNTER — Inpatient Hospital Stay (HOSPITAL_COMMUNITY): Payer: Medicare HMO

## 2021-01-15 DIAGNOSIS — N183 Chronic kidney disease, stage 3 unspecified: Secondary | ICD-10-CM | POA: Diagnosis not present

## 2021-01-15 DIAGNOSIS — I214 Non-ST elevation (NSTEMI) myocardial infarction: Secondary | ICD-10-CM | POA: Diagnosis not present

## 2021-01-15 DIAGNOSIS — I10 Essential (primary) hypertension: Secondary | ICD-10-CM | POA: Diagnosis not present

## 2021-01-15 DIAGNOSIS — E1151 Type 2 diabetes mellitus with diabetic peripheral angiopathy without gangrene: Secondary | ICD-10-CM | POA: Diagnosis not present

## 2021-01-15 LAB — BASIC METABOLIC PANEL
Anion gap: 7 (ref 5–15)
BUN: 15 mg/dL (ref 8–23)
CO2: 20 mmol/L — ABNORMAL LOW (ref 22–32)
Calcium: 8.3 mg/dL — ABNORMAL LOW (ref 8.9–10.3)
Chloride: 110 mmol/L (ref 98–111)
Creatinine, Ser: 1.29 mg/dL — ABNORMAL HIGH (ref 0.44–1.00)
GFR, Estimated: 42 mL/min — ABNORMAL LOW (ref >60)
Glucose, Bld: 151 mg/dL — ABNORMAL HIGH (ref 70–99)
Potassium: 4 mmol/L (ref 3.5–5.1)
Sodium: 137 mmol/L (ref 135–145)

## 2021-01-15 LAB — CBC
HCT: 33.4 % — ABNORMAL LOW (ref 36.0–46.0)
Hemoglobin: 10.9 g/dL — ABNORMAL LOW (ref 12.0–15.0)
MCH: 29.4 pg (ref 26.0–34.0)
MCHC: 32.6 g/dL (ref 30.0–36.0)
MCV: 90 fL (ref 80.0–100.0)
Platelets: 166 10*3/uL (ref 150–400)
RBC: 3.71 MIL/uL — ABNORMAL LOW (ref 3.87–5.11)
RDW: 14.5 % (ref 11.5–15.5)
WBC: 7.9 10*3/uL (ref 4.0–10.5)
nRBC: 0 % (ref 0.0–0.2)

## 2021-01-15 LAB — GLUCOSE, CAPILLARY
Glucose-Capillary: 178 mg/dL — ABNORMAL HIGH (ref 70–99)
Glucose-Capillary: 189 mg/dL — ABNORMAL HIGH (ref 70–99)
Glucose-Capillary: 183 mg/dL — ABNORMAL HIGH (ref 70–99)

## 2021-01-15 LAB — HEPARIN LEVEL (UNFRACTIONATED): Heparin Unfractionated: 0.3 IU/mL (ref 0.30–0.70)

## 2021-01-15 MED ORDER — CHLORHEXIDINE GLUCONATE CLOTH 2 % EX PADS
6.0000 | MEDICATED_PAD | Freq: Once | CUTANEOUS | Status: AC
  Administered 2021-01-17: 6 via TOPICAL

## 2021-01-15 MED ORDER — ALUM & MAG HYDROXIDE-SIMETH 200-200-20 MG/5ML PO SUSP
30.0000 mL | ORAL | Status: DC | PRN
  Administered 2021-01-15: 30 mL via ORAL
  Filled 2021-01-15: qty 30

## 2021-01-15 MED ORDER — CHLORHEXIDINE GLUCONATE CLOTH 2 % EX PADS
6.0000 | MEDICATED_PAD | Freq: Once | CUTANEOUS | Status: AC
  Administered 2021-01-16: 6 via TOPICAL

## 2021-01-15 NOTE — Plan of Care (Signed)
  Problem: Education: Goal: Knowledge of General Education information will improve Description: Including pain rating scale, medication(s)/side effects and non-pharmacologic comfort measures Outcome: Progressing   Problem: Health Behavior/Discharge Planning: Goal: Ability to manage health-related needs will improve Outcome: Progressing   Problem: Clinical Measurements: Goal: Ability to maintain clinical measurements within normal limits will improve Outcome: Progressing Goal: Will remain free from infection Outcome: Progressing Goal: Diagnostic test results will improve Outcome: Progressing Goal: Respiratory complications will improve Outcome: Progressing Goal: Cardiovascular complication will be avoided Outcome: Progressing   Problem: Nutrition: Goal: Adequate nutrition will be maintained Outcome: Progressing   Problem: Coping: Goal: Level of anxiety will decrease Outcome: Progressing   Problem: Pain Managment: Goal: General experience of comfort will improve Outcome: Progressing   Problem: Safety: Goal: Ability to remain free from injury will improve Outcome: Progressing

## 2021-01-15 NOTE — Progress Notes (Signed)
ANTICOAGULATION CONSULT NOTE - Follow Up Consult  Pharmacy Consult for Heparin Indication: chest pain/ACS, multivessel CAD  Allergies  Allergen Reactions   Keflex [Cephalexin] Shortness Of Breath and Other (See Comments)    Chest pain, headache   Lipitor [Atorvastatin] Shortness Of Breath and Other (See Comments)    Severe pain/cramping in legs- "I cannot walk, talk, or breathe" (pt is currently taking 20 mg daily 10/11/19)   Codeine Other (See Comments)    Disrupted patient's equilibrium- "Made my world flip upside down"   Hydrochlorothiazide Other (See Comments)    Other reaction(s): lightheaded, nausea   Hydrocodone-Acetaminophen Other (See Comments)    Other reaction(s): "get crazy"   Metformin Hcl Other (See Comments)    Other reaction(s): upset stomach   Tramadol Hcl Other (See Comments)    Other reaction(s): "get crazy"   Azor [Amlodipine-Olmesartan] Swelling, Palpitations and Rash    Amlodipine alone resumed 12/2013, tolerating   Lisinopril Cough   Penicillins Dermatitis and Other (See Comments)    Did it involve swelling of the face/tongue/throat, SOB, or low BP? No, just worsened eczema Did it involve sudden or severe rash/hives, skin peeling, or any reaction on the inside of your mouth or nose? No Did you need to seek medical attention at a hospital or doctor's office? No When did it last happen? "More than 10 years ago" If all above answers are "NO", may proceed with cephalosporin use.       Vital Signs: Temp: 98.2 F (36.8 C) (08/20 0730) Temp Source: Oral (08/20 0730) BP: 137/65 (08/20 0730) Pulse Rate: 72 (08/19 2102)  Labs: Recent Labs    01/13/21 0406 01/14/21 0126 01/15/21 0325  HGB 14.0 11.9* 10.9*  HCT 42.0 36.5 33.4*  PLT 172 179 166  HEPARINUNFRC 0.50 0.40 0.30  CREATININE 1.57* 1.60* 1.29*    Assessment: 80 y/o F with chest pain now s/p cath with multivessel CAD. Plans for CABG next week. Pharmacy asked to dose heparin.   Heparin level  today 0.30 and therapeutic but on lower end. Heparin level slightly trending down (0.40 > 0.30), will plan to slightly increase heparin gtt to keep within goal. Hgb/HCT slightly decreased today but stable. PLT are stable and WNL. No signs of bleeding or issues with IV site noted by RN.   Goal of Therapy:  Heparin level 0.3-0.7 units/ml Monitor platelets by anticoagulation protocol: Yes   Plan:  Increase IV heparin gtt to 1000 units/hour Daily Heparin level, CBC Monitor for signs/symptoms of bleeding Follow-up cards recs post CABG  Thank you for involving pharmacy in this patient's care.  Elita Quick, PharmD PGY1 Ambulatory Care Pharmacy Resident 01/15/2021 7:38 AM  **Pharmacist phone directory can be found on [email or web address] listed under [place]**

## 2021-01-15 NOTE — Progress Notes (Signed)
PROGRESS NOTE    [name]  UDJ:[id number] DOB: 1940-07-25 DOA: 01/09/2021 PCP: [name], MD    Chief Complaint  Patient presents with   Chest Pain   Emesis    Brief Narrative:   Ms. Creech is an 80 year old female patient with a past medical history significant for an abdominal aortic aneurysm repair in 2012, diabetes mellitus type 2, hypertension, hyperlipidemia, acute CVA  in 2016, known coronary artery disease with a stent placed in 2015 in the RCA, femoral-popliteal bypass graft done in 2016, GERD, arthritis, current pack a day cigarette smoker, and chronic kidney disease stage III who presented to the emergency department with chest pain, her work-up significant for NSTEMI, she went for cardiac cath 8/16, which was significant for multivessel disease, CT surgery consulted regarding CABG. patient initially refusing CABG, Dr. Terrence Dupont consulted [facility] regarding high risk PCI, it was felt that the best approach would be for patient to go for surgical intervention with CABG ,CT surgery reconsulted, and plan for CABG on Monday.   Assessment & Plan:    NSTEMI (non-ST elevated myocardial infarction)  -Patient underwent cardiac cath 8/16, significant for multivessel disease.  CABG was recommended.  Patient was reluctant. -Dr. Daneen Schick was consulted regarding second opinion concerning suitability for high risk PCI, it was determined patient will be better served with CABG, currently patient is agreeable, so CT surgery was consulted.  Current plan for surgery on Monday. -For now continue with medical management including aspirin, beta-blockers and heparin gtt.   -Patient was started on nitro drip given continued chest pain even at rest, unstable angina.  Hyperlipidemia Continue with statin   Essential hypertension Continue norvasc, metoprolol, spironolactone, Imdur, she is currently on nitro drip as well.  Blood pressure is reasonably well controlled   DM (diabetes mellitus)  type II uncontrolled, periph vascular disorder HbA1c 9.7.  Continue with SSI.  Monitor CBGs.-, Poorly controlled at 9.7, but this is better than previous value at 12 last year. -For now continue with insulin sliding scale  CKD (chronic kidney disease) stage 3 B Stable.  Continue to monitor urine output.  Avoid nephrotoxic agents.  Normocytic anemia No evidence of overt blood loss.  Continue to monitor.  Tobacco use Nicotine patch to prevent withdrawls. Pt encouraged to quit tobacco use for her health.   Musculoskeletal pain She will be started on Voltaren gel for her musculoskeletal right shoulder/neck pain    DVT prophylaxis: on heparin GTT Code Status: Patient is aware DNR has to be rescinded for Surgery. Family Communication: Discussed with patient Disposition: To be determined  Status is: Inpatient  Remains inpatient appropriate because:IV treatments appropriate due to intensity of illness or inability to take PO  Dispo: The patient is from: Home              Anticipated d/c is to: Home              Patient currently is not medically stable to d/c.   Difficult to place patient No       Consultants:  Cardiology CT surgery   Subjective: Had some chest pain this morning.  Currently better.  Denies any shortness of breath.  Some nausea this morning.  Objective: Vitals:   01/14/21 0500 01/14/21 1555 01/14/21 2102 01/15/21 0730  BP: (!) 132/59 137/64 (!) 150/66 137/65  Pulse: 68 76 72   Resp: 18 16  17   Temp: 98.2 F (36.8 C) 98.1 F (36.7 C)  98.2 F (36.8 C)  TempSrc:  Oral Oral  Oral  SpO2: 94% 96%    Weight:      Height:        Intake/Output Summary (Last 24 hours) at 01/15/2021 1144 Last data filed at 01/15/2021 1111 Gross per 24 hour  Intake 1803.98 ml  Output --  Net 1803.98 ml     Filed Weights   01/10/21 0900 01/10/21 2003  Weight: 74 kg 74 kg     Examination:  General appearance: Awake alert.  In no distress Resp: Clear to auscultation  bilaterally.  Normal effort Cardio: S1-S2 is normal regular.  No S3-S4.  No rubs murmurs or bruit GI: Abdomen is soft.  Nontender nondistended.  Bowel sounds are present normal.  No masses organomegaly Extremities: No edema.  Full range of motion of lower extremities. Neurologic: Alert and oriented x3.  No focal neurological deficits.      Data Reviewed: I have personally reviewed following labs and imaging studies  CBC: Recent Labs  Lab 01/11/21 0332 01/12/21 0129 01/13/21 0406 01/14/21 0126 01/15/21 0325  WBC 6.8 7.3 11.0* 9.3 7.9  HGB 12.7 12.7 14.0 11.9* 10.9*  HCT 39.5 38.9 42.0 36.5 33.4*  MCV 90.6 89.6 88.4 89.5 90.0  PLT 196 174 172 179 166     Basic Metabolic Panel: Recent Labs  Lab 01/11/21 0332 01/12/21 0129 01/13/21 0406 01/14/21 0126 01/15/21 0325  NA 137 139 138 137 137  K 4.1 4.0 4.5 3.9 4.0  CL 106 111 102 107 110  CO2 23 19* 22 22 20*  GLUCOSE 223* 135* 160* 144* 151*  BUN 19 13 15 18 15   CREATININE 1.31* 1.18* 1.57* 1.60* 1.29*  CALCIUM 9.0 8.8* 9.5 8.5* 8.3*     GFR: Estimated Creatinine Clearance: 36 mL/min (A) (by C-G formula based on SCr of 1.29 mg/dL (H)).  CBG: Recent Labs  Lab 01/14/21 1111 01/14/21 1603 01/14/21 2059 01/15/21 0734 01/15/21 1135  GLUCAP 194* 144* 151* 178* 189*      Recent Results (from the past 240 hour(s))  Resp Panel by RT-PCR (Flu A&B, Covid) Nasopharyngeal Swab     Status: None   Collection Time: 01/09/21 12:18 AM   Specimen: Nasopharyngeal Swab; Nasopharyngeal(NP) swabs in vial transport medium  Result Value Ref Range Status   SARS Coronavirus 2 by RT PCR NEGATIVE NEGATIVE Final    Comment: (NOTE) SARS-CoV-2 target nucleic acids are NOT DETECTED.  The SARS-CoV-2 RNA is generally detectable in upper respiratory specimens during the acute phase of infection. The lowest concentration of SARS-CoV-2 viral copies this assay can detect is 138 copies/mL. A negative result does not preclude  SARS-Cov-2 infection and should not be used as the sole basis for treatment or other patient management decisions. A negative result may occur with  improper specimen collection/handling, submission of specimen other than nasopharyngeal swab, presence of viral mutation(s) within the areas targeted by this assay, and inadequate number of viral copies(<138 copies/mL). A negative result must be combined with clinical observations, patient history, and epidemiological information. The expected result is Negative.  Fact Sheet for Patients:  EntrepreneurPulse.com.au  Fact Sheet for Healthcare Providers:  IncredibleEmployment.be  This test is no t yet approved or cleared by the [place] FDA and  has been authorized for detection and/or diagnosis of SARS-CoV-2 by FDA under an Emergency Use Authorization (EUA). This EUA will remain  in effect (meaning this test can be used) for the duration of the COVID-19 declaration under Section 564(b)(1) of the Act, 21 U.S.C.section 360bbb-3(b)(1), unless the  authorization is terminated  or revoked sooner.       Influenza A by PCR NEGATIVE NEGATIVE Final   Influenza B by PCR NEGATIVE NEGATIVE Final    Comment: (NOTE) The Xpert Xpress SARS-CoV-2/FLU/RSV plus assay is intended as an aid in the diagnosis of influenza from Nasopharyngeal swab specimens and should not be used as a sole basis for treatment. Nasal washings and aspirates are unacceptable for Xpert Xpress SARS-CoV-2/FLU/RSV testing.  Fact Sheet for Patients: EntrepreneurPulse.com.au  Fact Sheet for Healthcare Providers: IncredibleEmployment.be  This test is not yet approved or cleared by the [place] FDA and has been authorized for detection and/or diagnosis of SARS-CoV-2 by FDA under an Emergency Use Authorization (EUA). This EUA will remain in effect (meaning this test can be used) for the duration of  the COVID-19 declaration under Section 564(b)(1) of the Act, 21 U.S.C. section 360bbb-3(b)(1), unless the authorization is terminated or revoked.  Performed at [facility], [place] [address], [place], [place] [address]   Surgical pcr screen     Status: None   Collection Time: 01/13/21  5:21 PM   Specimen: Nasal Mucosa; Nasal Swab  Result Value Ref Range Status   MRSA, PCR NEGATIVE NEGATIVE Final   Staphylococcus aureus NEGATIVE NEGATIVE Final    Comment: (NOTE) The Xpert SA Assay (FDA approved for NASAL specimens in patients 50 years of age and older), is one component of a comprehensive surveillance program. It is not intended to diagnose infection nor to guide or monitor treatment. Performed at [facility], [place] [address], [place], [place] [address]           Radiology Studies: No results found.      Scheduled Meds:  amLODipine  5 mg Oral Daily   aspirin EC  81 mg Oral Daily   atorvastatin  80 mg Oral Daily   bisacodyl  5 mg Oral Once   [START ON 01/16/2021] Chlorhexidine Gluconate Cloth  6 each Topical Once   And   [START ON 01/17/2021] Chlorhexidine Gluconate Cloth  6 each Topical Once   diclofenac Sodium  2 g Topical TID   [START ON 01/17/2021] epinephrine  0-10 mcg/min Intravenous To OR   [START ON 01/17/2021] heparin-papaverine-plasmalyte irrigation   Irrigation To OR   insulin aspart  0-5 Units Subcutaneous QHS   insulin aspart  0-9 Units Subcutaneous TID WC   [START ON 01/17/2021] insulin   Intravenous To OR   [START ON 01/17/2021] [facility] Blood Cardioplegia vial (lidocaine/magnesium/mannitol 0.26g-4g-6.4g)   Intracoronary To OR   metoprolol tartrate  12.5 mg Oral Once   metoprolol tartrate  25 mg Oral BID   nicotine  14 mg Transdermal Daily   pantoprazole  40 mg Oral Daily   [START ON 01/17/2021] phenylephrine  30-200 mcg/min Intravenous To OR   [START ON 01/17/2021] potassium chloride  80 mEq Other To OR   sodium chloride flush  3 mL  Intravenous Q12H   sodium chloride flush  3 mL Intravenous Q12H   [START ON 01/17/2021] tranexamic acid  15 mg/kg Intravenous To OR   [START ON 01/17/2021] tranexamic acid  2 mg/kg Intracatheter To OR   Continuous Infusions:  sodium chloride     [START ON 01/17/2021] dexmedetomidine     [START ON 01/17/2021] heparin 30,000 units/NS 1000 mL solution for CELLSAVER     heparin 1,000 Units/hr (01/15/21 0825)   lactated ringers 50 mL/hr at 01/15/21 0217   [START ON 01/17/2021] levofloxacin (LEVAQUIN) IV     [  START ON 01/17/2021] milrinone     nitroGLYCERIN 10 mcg/min (01/12/21 1129)   [START ON 01/17/2021] nitroGLYCERIN     [START ON 01/17/2021] norepinephrine     [START ON 01/17/2021] tranexamic acid (CYKLOKAPRON) infusion ([facility])     [START ON 01/17/2021] vancomycin       LOS: 5 days     Bonnielee Haff, MD [facility]   To contact the attending provider between 7A-7P or the covering provider during after hours 7P-7A, please log into the web site [email or web address] and access using universal [place] password for that web site. If you do not have the password, please call the hospital operator.  01/15/2021, 11:44 AM

## 2021-01-15 NOTE — Progress Notes (Signed)
Ref: [name], MD   Subjective:  Awake. Sinus rhythm Awaiting CABG on Monday.  Objective:  Vital Signs in the last 24 hours: Temp:  [98.1 F (36.7 C)-98.2 F (36.8 C)] 98.2 F (36.8 C) (08/20 0730) Pulse Rate:  [72-76] 72 (08/19 2102) Cardiac Rhythm: Normal sinus rhythm (08/20 0800) Resp:  [16-17] 17 (08/20 0730) BP: (137-150)/(64-66) 137/65 (08/20 0730) SpO2:  [96 %] 96 % (08/19 1555)  Physical Exam: BP Readings from Last 1 Encounters:  01/15/21 137/65     Wt Readings from Last 1 Encounters:  01/10/21 74 kg    Weight change:  Body mass index is 25.55 kg/m. HEENT: /AT, Eyes-Brown, Conjunctiva-Pink, Sclera-Non-icteric Neck: No JVD, No bruit, Trachea midline. Lungs:  Clearing, Bilateral. Cardiac:  Regular rhythm, normal S1 and S2, no S3. II/VI systolic murmur. Abdomen:  Soft, non-tender. BS present. Extremities:  No edema present. No cyanosis. No clubbing. CNS: AxOx3, Cranial nerves grossly intact, moves all 4 extremities.  Skin: Warm and dry.   Intake/Output from previous day: 08/19 0701 - 08/20 0700 In: 1684 [P.O.:537; I.V.:1147] Out: -     Lab Results: BMET    Component Value Date/Time   NA 137 01/15/2021 0325   NA 137 01/14/2021 0126   NA 138 01/13/2021 0406   K 4.0 01/15/2021 0325   K 3.9 01/14/2021 0126   K 4.5 01/13/2021 0406   CL 110 01/15/2021 0325   CL 107 01/14/2021 0126   CL 102 01/13/2021 0406   CO2 20 (L) 01/15/2021 0325   CO2 22 01/14/2021 0126   CO2 22 01/13/2021 0406   GLUCOSE 151 (H) 01/15/2021 0325   GLUCOSE 144 (H) 01/14/2021 0126   GLUCOSE 160 (H) 01/13/2021 0406   BUN 15 01/15/2021 0325   BUN 18 01/14/2021 0126   BUN 15 01/13/2021 0406   CREATININE 1.29 (H) 01/15/2021 0325   CREATININE 1.60 (H) 01/14/2021 0126   CREATININE 1.57 (H) 01/13/2021 0406   CALCIUM 8.3 (L) 01/15/2021 0325   CALCIUM 8.5 (L) 01/14/2021 0126   CALCIUM 9.5 01/13/2021 0406   GFRNONAA 42 (L) 01/15/2021 0325   GFRNONAA 32 (L) 01/14/2021 0126    GFRNONAA 33 (L) 01/13/2021 0406   GFRAA 35 (L) 10/14/2019 0333   GFRAA 49 (L) 10/13/2019 0308   GFRAA 51 (L) 10/12/2019 0500   CBC    Component Value Date/Time   WBC 7.9 01/15/2021 0325   RBC 3.71 (L) 01/15/2021 0325   HGB 10.9 (L) 01/15/2021 0325   HCT 33.4 (L) 01/15/2021 0325   PLT 166 01/15/2021 0325   MCV 90.0 01/15/2021 0325   MCH 29.4 01/15/2021 0325   MCHC 32.6 01/15/2021 0325   RDW 14.5 01/15/2021 0325   LYMPHSABS 2.1 09/20/2020 0827   MONOABS 0.4 09/20/2020 0827   EOSABS 0.1 09/20/2020 0827   BASOSABS 0.1 09/20/2020 0827   HEPATIC Function Panel No results for input(s): PROT in the last 8760 hours.  Invalid input(s):  ALBUMIN,  AST,  ALT,  ALKPHOS,  BILIDIR,  IBILI HEMOGLOBIN A1C No components found for: HGA1C,  MPG CARDIAC ENZYMES Lab Results  Component Value Date   CKTOTAL 51 04/12/2014   CKMB 1.2 04/12/2014   TROPONINI 2.19 (HH) 04/15/2014   TROPONINI 4.74 (HH) 04/14/2014   TROPONINI 9.62 (HH) 04/13/2014   BNP No results for input(s): PROBNP in the last 8760 hours. TSH No results for input(s): TSH in the last 8760 hours. CHOLESTEROL Recent Labs    01/10/21 0939  CHOL 247*    Scheduled  Meds:  amLODipine  5 mg Oral Daily   aspirin EC  81 mg Oral Daily   atorvastatin  80 mg Oral Daily   bisacodyl  5 mg Oral Once   [START ON 01/16/2021] Chlorhexidine Gluconate Cloth  6 each Topical Once   And   [START ON 01/17/2021] Chlorhexidine Gluconate Cloth  6 each Topical Once   diclofenac Sodium  2 g Topical TID   [START ON 01/17/2021] epinephrine  0-10 mcg/min Intravenous To OR   [START ON 01/17/2021] heparin-papaverine-plasmalyte irrigation   Irrigation To OR   insulin aspart  0-5 Units Subcutaneous QHS   insulin aspart  0-9 Units Subcutaneous TID WC   [START ON 01/17/2021] insulin   Intravenous To OR   [START ON 01/17/2021] [facility] Blood Cardioplegia vial (lidocaine/magnesium/mannitol 0.26g-4g-6.4g)   Intracoronary To OR   metoprolol tartrate  12.5 mg Oral  Once   metoprolol tartrate  25 mg Oral BID   nicotine  14 mg Transdermal Daily   pantoprazole  40 mg Oral Daily   [START ON 01/17/2021] phenylephrine  30-200 mcg/min Intravenous To OR   [START ON 01/17/2021] potassium chloride  80 mEq Other To OR   sodium chloride flush  3 mL Intravenous Q12H   sodium chloride flush  3 mL Intravenous Q12H   [START ON 01/17/2021] tranexamic acid  15 mg/kg Intravenous To OR   [START ON 01/17/2021] tranexamic acid  2 mg/kg Intracatheter To OR   Continuous Infusions:  sodium chloride     [START ON 01/17/2021] dexmedetomidine     [START ON 01/17/2021] heparin 30,000 units/NS 1000 mL solution for CELLSAVER     heparin 1,000 Units/hr (01/15/21 0825)   lactated ringers 50 mL/hr at 01/15/21 1158   [START ON 01/17/2021] levofloxacin (LEVAQUIN) IV     [START ON 01/17/2021] milrinone     nitroGLYCERIN 10 mcg/min (01/12/21 1129)   [START ON 01/17/2021] nitroGLYCERIN     [START ON 01/17/2021] norepinephrine     [START ON 01/17/2021] tranexamic acid (CYKLOKAPRON) infusion ([facility])     [START ON 01/17/2021] vancomycin     PRN Meds:.sodium chloride, acetaminophen **OR** acetaminophen, albuterol, loperamide, magnesium hydroxide, nitroGLYCERIN, ondansetron (ZOFRAN) IV, sodium chloride flush, temazepam  Assessment/Plan:  NSTEMI, acute Multivessel CAD S/P RCA stenting HTN HLD Type 2 DM S/P CVA PVD CKD, IIIb  Plan: Continue medical treatment. CABG on Monday.    LOS: 5 days   Time spent including chart review, lab review, examination, discussion with patient :  min   Dixie Dials  MD  01/15/2021, 12:34 PM

## 2021-01-16 ENCOUNTER — Inpatient Hospital Stay (HOSPITAL_COMMUNITY): Payer: Medicare HMO

## 2021-01-16 DIAGNOSIS — I214 Non-ST elevation (NSTEMI) myocardial infarction: Secondary | ICD-10-CM | POA: Diagnosis not present

## 2021-01-16 DIAGNOSIS — Z01810 Encounter for preprocedural cardiovascular examination: Secondary | ICD-10-CM

## 2021-01-16 DIAGNOSIS — I10 Essential (primary) hypertension: Secondary | ICD-10-CM | POA: Diagnosis not present

## 2021-01-16 DIAGNOSIS — N183 Chronic kidney disease, stage 3 unspecified: Secondary | ICD-10-CM | POA: Diagnosis not present

## 2021-01-16 DIAGNOSIS — E1151 Type 2 diabetes mellitus with diabetic peripheral angiopathy without gangrene: Secondary | ICD-10-CM | POA: Diagnosis not present

## 2021-01-16 LAB — POCT I-STAT 7, (LYTES, BLD GAS, ICA,H+H)
Acid-base deficit: 1 mmol/L (ref 0.0–2.0)
Bicarbonate: 22.4 mmol/L (ref 20.0–28.0)
Calcium, Ion: 1.2 mmol/L (ref 1.15–1.40)
HCT: 34 % — ABNORMAL LOW (ref 36.0–46.0)
Hemoglobin: 11.6 g/dL — ABNORMAL LOW (ref 12.0–15.0)
O2 Saturation: 92 %
Potassium: 3.8 mmol/L (ref 3.5–5.1)
Sodium: 140 mmol/L (ref 135–145)
TCO2: 23 mmol/L (ref 22–32)
pCO2 arterial: 32.7 mmHg (ref 32.0–48.0)
pH, Arterial: 7.444 (ref 7.350–7.450)
pO2, Arterial: 61 mmHg — ABNORMAL LOW (ref 83.0–108.0)

## 2021-01-16 LAB — CBC
HCT: 29.9 % — ABNORMAL LOW (ref 36.0–46.0)
Hemoglobin: 9.8 g/dL — ABNORMAL LOW (ref 12.0–15.0)
MCH: 29.3 pg (ref 26.0–34.0)
MCHC: 32.8 g/dL (ref 30.0–36.0)
MCV: 89.3 fL (ref 80.0–100.0)
Platelets: 170 10*3/uL (ref 150–400)
RBC: 3.35 MIL/uL — ABNORMAL LOW (ref 3.87–5.11)
RDW: 14.3 % (ref 11.5–15.5)
WBC: 7.2 10*3/uL (ref 4.0–10.5)
nRBC: 0 % (ref 0.0–0.2)

## 2021-01-16 LAB — BASIC METABOLIC PANEL
Anion gap: 6 (ref 5–15)
BUN: 11 mg/dL (ref 8–23)
CO2: 22 mmol/L (ref 22–32)
Calcium: 8.1 mg/dL — ABNORMAL LOW (ref 8.9–10.3)
Chloride: 107 mmol/L (ref 98–111)
Creatinine, Ser: 1.26 mg/dL — ABNORMAL HIGH (ref 0.44–1.00)
GFR, Estimated: 43 mL/min — ABNORMAL LOW (ref >60)
Glucose, Bld: 137 mg/dL — ABNORMAL HIGH (ref 70–99)
Potassium: 3.7 mmol/L (ref 3.5–5.1)
Sodium: 135 mmol/L (ref 135–145)

## 2021-01-16 LAB — GLUCOSE, CAPILLARY
Glucose-Capillary: 140 mg/dL — ABNORMAL HIGH (ref 70–99)
Glucose-Capillary: 163 mg/dL — ABNORMAL HIGH (ref 70–99)
Glucose-Capillary: 196 mg/dL — ABNORMAL HIGH (ref 70–99)
Glucose-Capillary: 148 mg/dL — ABNORMAL HIGH (ref 70–99)
Glucose-Capillary: 226 mg/dL — ABNORMAL HIGH (ref 70–99)

## 2021-01-16 LAB — PROTIME-INR
INR: 1 (ref 0.8–1.2)
Prothrombin Time: 12.9 seconds (ref 11.4–15.2)

## 2021-01-16 LAB — HEPARIN LEVEL (UNFRACTIONATED): Heparin Unfractionated: 0.43 IU/mL (ref 0.30–0.70)

## 2021-01-16 LAB — APTT: aPTT: 105 seconds — ABNORMAL HIGH (ref 24–36)

## 2021-01-16 MED ORDER — BISACODYL 5 MG PO TBEC: 5.0000 mg | DELAYED_RELEASE_TABLET | Freq: Once | ORAL | Status: DC

## 2021-01-16 MED ORDER — METOPROLOL TARTRATE 12.5 MG HALF TABLET: 12.5000 mg | ORAL_TABLET | Freq: Once | ORAL | Status: AC

## 2021-01-16 NOTE — Progress Notes (Signed)
VASCULAR LAB    Pre CABG Dopplers have been performed.  See CV proc for preliminary results.   Mychal Decarlo, RVT 01/16/2021, 3:46 PM

## 2021-01-16 NOTE — Progress Notes (Signed)
Ref: [name], MD   Subjective:  Awake. VS stable. Sinus rhythm. Rare VPC. Aware of CABG tomorrow. Discussed what is expected post surgery. Creatinine is marginally improving.  Objective:  Vital Signs in the last 24 hours: Temp:  [97.6 F (36.4 C)-98.4 F (36.9 C)] 97.6 F (36.4 C) (08/21 0518) Pulse Rate:  [63-69] 69 (08/21 0947) Cardiac Rhythm: Sinus bradycardia;Normal sinus rhythm (08/21 0700) Resp:  [17-21] 19 (08/21 0518) BP: (140-154)/(62-78) 140/62 (08/21 0947) SpO2:  [92 %-99 %] 99 % (08/21 0518)  Physical Exam: BP Readings from Last 1 Encounters:  01/16/21 140/62     Wt Readings from Last 1 Encounters:  01/10/21 74 kg    Weight change:  Body mass index is 25.55 kg/m. HEENT: [place]/AT, Eyes-Brown,Conjunctiva-Pink, Sclera-Non-icteric Neck: No JVD, No bruit, Trachea midline. Lungs:  Clear, Bilateral. Cardiac:  Regular rhythm, normal S1 and S2, no S3. II/VI systolic murmur. Abdomen:  Soft, non-tender. BS present. Extremities:  No edema present. No cyanosis. No clubbing. CNS: AxOx3, Cranial nerves grossly intact, moves all 4 extremities. Weakness in both lower extremities. Skin: Warm and dry.   Intake/Output from previous day: 08/20 0701 - 08/21 0700 In: 1006.1 [P.O.:297; I.V.:709.1] Out: 400 [Urine:400]    Lab Results: BMET    Component Value Date/Time   NA 135 01/16/2021 0233   NA 137 01/15/2021 0325   NA 137 01/14/2021 0126   K 3.7 01/16/2021 0233   K 4.0 01/15/2021 0325   K 3.9 01/14/2021 0126   CL 107 01/16/2021 0233   CL 110 01/15/2021 0325   CL 107 01/14/2021 0126   CO2 22 01/16/2021 0233   CO2 20 (L) 01/15/2021 0325   CO2 22 01/14/2021 0126   GLUCOSE 137 (H) 01/16/2021 0233   GLUCOSE 151 (H) 01/15/2021 0325   GLUCOSE 144 (H) 01/14/2021 0126   BUN 11 01/16/2021 0233   BUN 15 01/15/2021 0325   BUN 18 01/14/2021 0126   CREATININE 1.26 (H) 01/16/2021 0233   CREATININE 1.29 (H) 01/15/2021 0325   CREATININE 1.60 (H) 01/14/2021 0126    CALCIUM 8.1 (L) 01/16/2021 0233   CALCIUM 8.3 (L) 01/15/2021 0325   CALCIUM 8.5 (L) 01/14/2021 0126   GFRNONAA 43 (L) 01/16/2021 0233   GFRNONAA 42 (L) 01/15/2021 0325   GFRNONAA 32 (L) 01/14/2021 0126   GFRAA 35 (L) 10/14/2019 0333   GFRAA 49 (L) 10/13/2019 0308   GFRAA 51 (L) 10/12/2019 0500   CBC    Component Value Date/Time   WBC 7.2 01/16/2021 0233   RBC 3.35 (L) 01/16/2021 0233   HGB 9.8 (L) 01/16/2021 0233   HCT 29.9 (L) 01/16/2021 0233   PLT 170 01/16/2021 0233   MCV 89.3 01/16/2021 0233   MCH 29.3 01/16/2021 0233   MCHC 32.8 01/16/2021 0233   RDW 14.3 01/16/2021 0233   LYMPHSABS 2.1 09/20/2020 0827   MONOABS 0.4 09/20/2020 0827   EOSABS 0.1 09/20/2020 0827   BASOSABS 0.1 09/20/2020 0827   HEPATIC Function Panel No results for input(s): PROT in the last 8760 hours.  Invalid input(s):  ALBUMIN,  AST,  ALT,  ALKPHOS,  BILIDIR,  IBILI HEMOGLOBIN A1C No components found for: HGA1C,  MPG CARDIAC ENZYMES Lab Results  Component Value Date   CKTOTAL 51 04/12/2014   CKMB 1.2 04/12/2014   TROPONINI 2.19 (HH) 04/15/2014   TROPONINI 4.74 (HH) 04/14/2014   TROPONINI 9.62 (HH) 04/13/2014   BNP No results for input(s): PROBNP in the last 8760 hours. TSH No results for input(s): TSH  in the last 8760 hours. CHOLESTEROL Recent Labs    01/10/21 0939  CHOL 247*    Scheduled Meds:  amLODipine  5 mg Oral Daily   aspirin EC  81 mg Oral Daily   atorvastatin  80 mg Oral Daily   bisacodyl  5 mg Oral Once   bisacodyl  5 mg Oral Once   Chlorhexidine Gluconate Cloth  6 each Topical Once   And   [START ON 01/17/2021] Chlorhexidine Gluconate Cloth  6 each Topical Once   diclofenac Sodium  2 g Topical TID   [START ON 01/17/2021] epinephrine  0-10 mcg/min Intravenous To OR   [START ON 01/17/2021] heparin-papaverine-plasmalyte irrigation   Irrigation To OR   insulin aspart  0-5 Units Subcutaneous QHS   insulin aspart  0-9 Units Subcutaneous TID WC   [START ON 01/17/2021] insulin    Intravenous To OR   [START ON 01/17/2021] [facility] Blood Cardioplegia vial (lidocaine/magnesium/mannitol 0.26g-4g-6.4g)   Intracoronary To OR   metoprolol tartrate  12.5 mg Oral Once   [START ON 01/17/2021] metoprolol tartrate  12.5 mg Oral Once   metoprolol tartrate  25 mg Oral BID   nicotine  14 mg Transdermal Daily   pantoprazole  40 mg Oral Daily   [START ON 01/17/2021] phenylephrine  30-200 mcg/min Intravenous To OR   [START ON 01/17/2021] potassium chloride  80 mEq Other To OR   sodium chloride flush  3 mL Intravenous Q12H   sodium chloride flush  3 mL Intravenous Q12H   [START ON 01/17/2021] tranexamic acid  15 mg/kg Intravenous To OR   [START ON 01/17/2021] tranexamic acid  2 mg/kg Intracatheter To OR   Continuous Infusions:  sodium chloride     [START ON 01/17/2021] dexmedetomidine     [START ON 01/17/2021] heparin 30,000 units/NS 1000 mL solution for CELLSAVER     heparin 1,000 Units/hr (01/15/21 1648)   lactated ringers 50 mL/hr at 01/15/21 1158   [START ON 01/17/2021] levofloxacin (LEVAQUIN) IV     [START ON 01/17/2021] milrinone     nitroGLYCERIN 35 mcg/min (01/16/21 0019)   [START ON 01/17/2021] nitroGLYCERIN     [START ON 01/17/2021] norepinephrine     [START ON 01/17/2021] tranexamic acid (CYKLOKAPRON) infusion ([facility])     [START ON 01/17/2021] vancomycin     PRN Meds:.sodium chloride, acetaminophen **OR** acetaminophen, albuterol, alum & mag hydroxide-simeth, loperamide, magnesium hydroxide, nitroGLYCERIN, ondansetron (ZOFRAN) IV, sodium chloride flush, temazepam  Assessment/Plan:  NSTEMI, acute Multivessel CAD S/P RCA stenting HTN HLD TYype 2 DM S/P CVA PVD CKD IIIa  Plan: CABG tomorrow.   LOS: 6 days   Time spent including chart review, lab review, examination, discussion with patient/Nurse : 30 min   Dixie Dials  MD  01/16/2021, 9:57 AM

## 2021-01-16 NOTE — Progress Notes (Signed)
ANTICOAGULATION CONSULT NOTE - Follow Up Consult  Pharmacy Consult for Heparin Indication: chest pain/ACS, multivessel CAD  Allergies  Allergen Reactions   Keflex [Cephalexin] Shortness Of Breath and Other (See Comments)    Chest pain, headache   Lipitor [Atorvastatin] Shortness Of Breath and Other (See Comments)    Severe pain/cramping in legs- "I cannot walk, talk, or breathe" (pt is currently taking 20 mg daily 10/11/19)   Codeine Other (See Comments)    Disrupted patient's equilibrium- "Made my world flip upside down"   Hydrochlorothiazide Other (See Comments)    Other reaction(s): lightheaded, nausea   Hydrocodone-Acetaminophen Other (See Comments)    Other reaction(s): "get crazy"   Metformin Hcl Other (See Comments)    Other reaction(s): upset stomach   Tramadol Hcl Other (See Comments)    Other reaction(s): "get crazy"   Azor [Amlodipine-Olmesartan] Swelling, Palpitations and Rash    Amlodipine alone resumed 12/2013, tolerating   Lisinopril Cough   Penicillins Dermatitis and Other (See Comments)    Did it involve swelling of the face/tongue/throat, SOB, or low BP? No, just worsened eczema Did it involve sudden or severe rash/hives, skin peeling, or any reaction on the inside of your mouth or nose? No Did you need to seek medical attention at a hospital or doctor's office? No When did it last happen? "More than 10 years ago" If all above answers are "NO", may proceed with cephalosporin use.       Vital Signs: Temp: 97.6 F (36.4 C) (08/21 0518) Temp Source: Oral (08/21 0518) BP: 143/75 (08/21 0518) Pulse Rate: 68 (08/21 0518)  Labs: Recent Labs    01/14/21 0126 01/15/21 0325 01/16/21 0233  HGB 11.9* 10.9* 9.8*  HCT 36.5 33.4* 29.9*  PLT 179 166 170  HEPARINUNFRC 0.40 0.30 0.43  CREATININE 1.60* 1.29* 1.26*    Assessment: 80 y/o F with chest pain now s/p cath with multivessel CAD. Plan is for CABG on 01/17/21. Pharmacy asked to dose heparin.   Heparin  level today 0.43 and therapeutic. H/H slightly decreased today (10.9 > 9.8; 33 >29), no evidence of bleeding per RN. Will continue to monitor. PLT today are stable and WNL.   Goal of Therapy:  Heparin level 0.3-0.7 units/ml Monitor platelets by anticoagulation protocol: Yes   Plan:  Continue IV heparin gtt @ 1000 units/h Daily Heparin level, CBC Monitor for signs/symptoms of bleeding Follow-up cards recs post CABG  Thank you for involving pharmacy in this patient's care.  Elita Quick, PharmD PGY1 Ambulatory Care Pharmacy Resident 01/16/2021 7:27 AM  **Pharmacist phone directory can be found on [email or web address] listed under [place]**

## 2021-01-16 NOTE — Progress Notes (Signed)
PROGRESS NOTE    [name]  OIZ:[id number] DOB: 02-15-1941 DOA: 01/09/2021 PCP: [name], MD    Chief Complaint  Patient presents with   Chest Pain   Emesis    Brief Narrative:   [name] is an 80 year old female patient with a past medical history significant for an abdominal aortic aneurysm repair in 2012, diabetes mellitus type 2, hypertension, hyperlipidemia, acute CVA  in 2016, known coronary artery disease with a stent placed in 2015 in the RCA, femoral-popliteal bypass graft done in 2016, GERD, arthritis, current pack a day cigarette smoker, and chronic kidney disease stage III who presented to the emergency department with chest pain, her work-up significant for NSTEMI, she went for cardiac cath 8/16, which was significant for multivessel disease, CT surgery consulted regarding CABG. patient initially refusing CABG, Dr. Terrence Dupont consulted [facility] regarding high risk PCI, it was felt that the best approach would be for patient to go for surgical intervention with CABG ,CT surgery reconsulted, and plan for CABG on Monday.   Assessment & Plan:    NSTEMI (non-ST elevated myocardial infarction)  -Patient underwent cardiac cath 8/16, significant for multivessel disease.  CABG was recommended.  Patient was reluctant. -Dr. Daneen Schick was consulted regarding second opinion concerning suitability for high risk PCI, it was determined patient will be better served with CABG, currently patient is agreeable, so CT surgery was consulted.  Current plan for surgery on Monday. -Patient remains on aspirin, beta-blockers and heparin gtt.   -Patient was started on nitro drip given continued chest pain even at rest, unstable angina. Seems to be stable from cardiac standpoint.  Does not have any anginal symptoms this morning.  Hyperlipidemia Continue with statin   Essential hypertension Reasonably well-controlled on amlodipine and metoprolol.     DM (diabetes mellitus) type II  uncontrolled, periph vascular disorder HbA1c 9.7.  This is better than previous value at 12 last year.  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  Continue to monitor urine output.  Avoid nephrotoxic agents.  Normocytic anemia No evidence of overt blood loss.  Hemoglobin remained stable.  Tobacco use Nicotine patch to prevent withdrawls. Pt encouraged to quit tobacco use for her health.   Musculoskeletal pain She will be started on Voltaren gel for her musculoskeletal right shoulder/neck pain    DVT prophylaxis: on heparin GTT Code Status: Patient is aware DNR has to be rescinded for Surgery. Family Communication: Discussed with patient Disposition: To be determined  Status is: Inpatient  Remains inpatient appropriate because:IV treatments appropriate due to intensity of illness or inability to take PO  Dispo: The patient is from: Home              Anticipated d/c is to: Home              Patient currently is not medically stable to d/c.   Difficult to place patient No       Consultants:  Cardiology CT surgery   Subjective: Denies any chest pain currently.  No nausea vomiting.  No shortness of breath.  Slightly nervous about her surgery tomorrow.  Objective: Vitals:   01/15/21 1937 01/15/21 2000 01/16/21 0518 01/16/21 0947  BP: (!) 148/78  (!) 143/75 140/62  Pulse: 68 63 68 69  Resp: 20 17 19    Temp: 98.4 F (36.9 C)  97.6 F (36.4 C)   TempSrc: Oral  Oral   SpO2: 97% 92% 99%   Weight:      Height:  Intake/Output Summary (Last 24 hours) at 01/16/2021 1146 Last data filed at 01/15/2021 1648 Gross per 24 hour  Intake 886.07 ml  Output 400 ml  Net 486.07 ml     Filed Weights   01/10/21 0900 01/10/21 2003  Weight: 74 kg 74 kg     Examination:  General appearance: Awake alert.  In no distress Resp: Clear to auscultation bilaterally.  Normal effort Cardio: S1-S2 is normal regular.  No S3-S4.  No rubs murmurs or bruit GI: Abdomen is soft.   Nontender nondistended.  Bowel sounds are present normal.  No masses organomegaly Extremities: No edema.  Full range of motion of lower extremities. Neurologic: Alert and oriented x3.  No focal neurological deficits.       Data Reviewed: I have personally reviewed following labs and imaging studies  CBC: Recent Labs  Lab 01/12/21 0129 01/13/21 0406 01/14/21 0126 01/15/21 0325 01/16/21 0233  WBC 7.3 11.0* 9.3 7.9 7.2  HGB 12.7 14.0 11.9* 10.9* 9.8*  HCT 38.9 42.0 36.5 33.4* 29.9*  MCV 89.6 88.4 89.5 90.0 89.3  PLT 174 172 179 166 170     Basic Metabolic Panel: Recent Labs  Lab 01/12/21 0129 01/13/21 0406 01/14/21 0126 01/15/21 0325 01/16/21 0233  NA 139 138 137 137 135  K 4.0 4.5 3.9 4.0 3.7  CL 111 102 107 110 107  CO2 19* 22 22 20* 22  GLUCOSE 135* 160* 144* 151* 137*  BUN 13 15 18 15 11   CREATININE 1.18* 1.57* 1.60* 1.29* 1.26*  CALCIUM 8.8* 9.5 8.5* 8.3* 8.1*     GFR: Estimated Creatinine Clearance: 36.8 mL/min (A) (by C-G formula based on SCr of 1.26 mg/dL (H)).  CBG: Recent Labs  Lab 01/15/21 1135 01/15/21 1558 01/15/21 2154 01/16/21 0747 01/16/21 1103  GLUCAP 189* 183* 140* 163* 196*      Recent Results (from the past 240 hour(s))  Resp Panel by RT-PCR (Flu A&B, Covid) Nasopharyngeal Swab     Status: None   Collection Time: 01/09/21 12:18 AM   Specimen: Nasopharyngeal Swab; Nasopharyngeal(NP) swabs in vial transport medium  Result Value Ref Range Status   SARS Coronavirus 2 by RT PCR NEGATIVE NEGATIVE Final    Comment: (NOTE) SARS-CoV-2 target nucleic acids are NOT DETECTED.  The SARS-CoV-2 RNA is generally detectable in upper respiratory specimens during the acute phase of infection. The lowest concentration of SARS-CoV-2 viral copies this assay can detect is 138 copies/mL. A negative result does not preclude SARS-Cov-2 infection and should not be used as the sole basis for treatment or other patient management decisions. A negative  result may occur with  improper specimen collection/handling, submission of specimen other than nasopharyngeal swab, presence of viral mutation(s) within the areas targeted by this assay, and inadequate number of viral copies(<138 copies/mL). A negative result must be combined with clinical observations, patient history, and epidemiological information. The expected result is Negative.  Fact Sheet for Patients:  EntrepreneurPulse.com.au  Fact Sheet for Healthcare Providers:  IncredibleEmployment.be  This test is no t yet approved or cleared by the [place] FDA and  has been authorized for detection and/or diagnosis of SARS-CoV-2 by FDA under an Emergency Use Authorization (EUA). This EUA will remain  in effect (meaning this test can be used) for the duration of the COVID-19 declaration under Section 564(b)(1) of the Act, 21 U.S.C.section 360bbb-3(b)(1), unless the authorization is terminated  or revoked sooner.       Influenza A by PCR NEGATIVE NEGATIVE Final   Influenza  B by PCR NEGATIVE NEGATIVE Final    Comment: (NOTE) The Xpert Xpress SARS-CoV-2/FLU/RSV plus assay is intended as an aid in the diagnosis of influenza from Nasopharyngeal swab specimens and should not be used as a sole basis for treatment. Nasal washings and aspirates are unacceptable for Xpert Xpress SARS-CoV-2/FLU/RSV testing.  Fact Sheet for Patients: EntrepreneurPulse.com.au  Fact Sheet for Healthcare Providers: IncredibleEmployment.be  This test is not yet approved or cleared by the [place] FDA and has been authorized for detection and/or diagnosis of SARS-CoV-2 by FDA under an Emergency Use Authorization (EUA). This EUA will remain in effect (meaning this test can be used) for the duration of the COVID-19 declaration under Section 564(b)(1) of the Act, 21 U.S.C. section 360bbb-3(b)(1), unless the authorization is  terminated or revoked.  Performed at [facility], [place] [address] [place], [place] [address]   Surgical pcr screen     Status: None   Collection Time: 01/13/21  5:21 PM   Specimen: Nasal Mucosa; Nasal Swab  Result Value Ref Range Status   MRSA, PCR NEGATIVE NEGATIVE Final   Staphylococcus aureus NEGATIVE NEGATIVE Final    Comment: (NOTE) The Xpert SA Assay (FDA approved for NASAL specimens in patients 2 years of age and older), is one component of a comprehensive surveillance program. It is not intended to diagnose infection nor to guide or monitor treatment. Performed at [facility], [place] [address] [place],  [address]           Radiology Studies: No results found.      Scheduled Meds:  amLODipine  5 mg Oral Daily   aspirin EC  81 mg Oral Daily   atorvastatin  80 mg Oral Daily   bisacodyl  5 mg Oral Once   bisacodyl  5 mg Oral Once   Chlorhexidine Gluconate Cloth  6 each Topical Once   And   [START ON 01/17/2021] Chlorhexidine Gluconate Cloth  6 each Topical Once   diclofenac Sodium  2 g Topical TID   [START ON 01/17/2021] epinephrine  0-10 mcg/min Intravenous To OR   [START ON 01/17/2021] heparin-papaverine-plasmalyte irrigation   Irrigation To OR   insulin aspart  0-5 Units Subcutaneous QHS   insulin aspart  0-9 Units Subcutaneous TID WC   [START ON 01/17/2021] insulin   Intravenous To OR   [START ON 01/17/2021] [place] Blood Cardioplegia vial (lidocaine/magnesium/mannitol 0.26g-4g-6.4g)   Intracoronary To OR   metoprolol tartrate  12.5 mg Oral Once   [START ON 01/17/2021] metoprolol tartrate  12.5 mg Oral Once   metoprolol tartrate  25 mg Oral BID   nicotine  14 mg Transdermal Daily   pantoprazole  40 mg Oral Daily   [START ON 01/17/2021] phenylephrine  30-200 mcg/min Intravenous To OR   [START ON 01/17/2021] potassium chloride  80 mEq Other To OR   sodium chloride flush  3 mL Intravenous Q12H   sodium chloride flush  3 mL Intravenous  Q12H   [START ON 01/17/2021] tranexamic acid  15 mg/kg Intravenous To OR   [START ON 01/17/2021] tranexamic acid  2 mg/kg Intracatheter To OR   Continuous Infusions:  sodium chloride     [START ON 01/17/2021] dexmedetomidine     [START ON 01/17/2021] heparin 30,000 units/NS 1000 mL solution for CELLSAVER     heparin 1,000 Units/hr (01/15/21 1648)   lactated ringers 50 mL/hr at 01/15/21 1158   [START ON 01/17/2021] levofloxacin (LEVAQUIN) IV     [START ON 01/17/2021] milrinone  nitroGLYCERIN 35 mcg/min (01/16/21 0019)   [START ON 01/17/2021] nitroGLYCERIN     [START ON 01/17/2021] norepinephrine     [START ON 01/17/2021] tranexamic acid (CYKLOKAPRON) infusion ([facility])     [START ON 01/17/2021] vancomycin       LOS: 6 days     Bonnielee Haff, MD [facility]   To contact the attending provider between 7A-7P or the covering provider during after hours 7P-7A, please log into the web site [email or web address] and access using universal [place] password for that web site. If you do not have the password, please call the hospital operator.  01/16/2021, 11:46 AM

## 2021-01-17 ENCOUNTER — Inpatient Hospital Stay (HOSPITAL_COMMUNITY): Payer: Medicare HMO

## 2021-01-17 ENCOUNTER — Inpatient Hospital Stay (HOSPITAL_COMMUNITY): Payer: Medicare HMO | Admitting: Certified Registered Nurse Anesthetist

## 2021-01-17 ENCOUNTER — Inpatient Hospital Stay (HOSPITAL_COMMUNITY)
Admission: EM | Disposition: A | Discharge status: Skilled Nursing Facility | Payer: Self-pay | Source: Home / Self Care | Attending: Thoracic Surgery (Cardiothoracic Vascular Surgery)

## 2021-01-17 DIAGNOSIS — I25110 Atherosclerotic heart disease of native coronary artery with unstable angina pectoris: Secondary | ICD-10-CM

## 2021-01-17 DIAGNOSIS — Z951 Presence of aortocoronary bypass graft: Secondary | ICD-10-CM

## 2021-01-17 DIAGNOSIS — I214 Non-ST elevation (NSTEMI) myocardial infarction: Secondary | ICD-10-CM

## 2021-01-17 HISTORY — PX: CORONARY ARTERY BYPASS GRAFT: SHX141

## 2021-01-17 HISTORY — PX: ENDOVEIN HARVEST OF GREATER SAPHENOUS VEIN: SHX5059

## 2021-01-17 HISTORY — PX: TEE WITHOUT CARDIOVERSION: SHX5443

## 2021-01-17 LAB — POCT I-STAT 7, (LYTES, BLD GAS, ICA,H+H)
Acid-Base Excess: 1 mmol/L (ref 0.0–2.0)
Acid-Base Excess: 1 mmol/L (ref 0.0–2.0)
Acid-Base Excess: 2 mmol/L (ref 0.0–2.0)
Acid-base deficit: 1 mmol/L (ref 0.0–2.0)
Acid-base deficit: 3 mmol/L — ABNORMAL HIGH (ref 0.0–2.0)
Bicarbonate: 24.9 mmol/L (ref 20.0–28.0)
Bicarbonate: 25.6 mmol/L (ref 20.0–28.0)
Bicarbonate: 25.8 mmol/L (ref 20.0–28.0)
Bicarbonate: 24.8 mmol/L (ref 20.0–28.0)
Bicarbonate: 22.5 mmol/L (ref 20.0–28.0)
Calcium, Ion: 1.22 mmol/L (ref 1.15–1.40)
Calcium, Ion: 1.07 mmol/L — ABNORMAL LOW (ref 1.15–1.40)
Calcium, Ion: 1.09 mmol/L — ABNORMAL LOW (ref 1.15–1.40)
Calcium, Ion: 1.24 mmol/L (ref 1.15–1.40)
Calcium, Ion: 1.28 mmol/L (ref 1.15–1.40)
HCT: 33 % — ABNORMAL LOW (ref 36.0–46.0)
HCT: 23 % — ABNORMAL LOW (ref 36.0–46.0)
HCT: 24 % — ABNORMAL LOW (ref 36.0–46.0)
HCT: 30 % — ABNORMAL LOW (ref 36.0–46.0)
HCT: 32 % — ABNORMAL LOW (ref 36.0–46.0)
Hemoglobin: 11.2 g/dL — ABNORMAL LOW (ref 12.0–15.0)
Hemoglobin: 7.8 g/dL — ABNORMAL LOW (ref 12.0–15.0)
Hemoglobin: 8.2 g/dL — ABNORMAL LOW (ref 12.0–15.0)
Hemoglobin: 10.2 g/dL — ABNORMAL LOW (ref 12.0–15.0)
Hemoglobin: 10.9 g/dL — ABNORMAL LOW (ref 12.0–15.0)
O2 Saturation: 100 %
O2 Saturation: 100 %
O2 Saturation: 100 %
O2 Saturation: 100 %
O2 Saturation: 97 %
Patient temperature: 36.6
Potassium: 3.8 mmol/L (ref 3.5–5.1)
Potassium: 4 mmol/L (ref 3.5–5.1)
Potassium: 4.8 mmol/L (ref 3.5–5.1)
Potassium: 4.5 mmol/L (ref 3.5–5.1)
Potassium: 4.5 mmol/L (ref 3.5–5.1)
Sodium: 143 mmol/L (ref 135–145)
Sodium: 141 mmol/L (ref 135–145)
Sodium: 141 mmol/L (ref 135–145)
Sodium: 142 mmol/L (ref 135–145)
Sodium: 142 mmol/L (ref 135–145)
TCO2: 26 mmol/L (ref 22–32)
TCO2: 27 mmol/L (ref 22–32)
TCO2: 27 mmol/L (ref 22–32)
TCO2: 26 mmol/L (ref 22–32)
TCO2: 24 mmol/L (ref 22–32)
pCO2 arterial: 37.5 mmHg (ref 32.0–48.0)
pCO2 arterial: 36.8 mmHg (ref 32.0–48.0)
pCO2 arterial: 40.5 mmHg (ref 32.0–48.0)
pCO2 arterial: 44 mmHg (ref 32.0–48.0)
pCO2 arterial: 38.6 mmHg (ref 32.0–48.0)
pH, Arterial: 7.431 (ref 7.350–7.450)
pH, Arterial: 7.41 (ref 7.350–7.450)
pH, Arterial: 7.455 — ABNORMAL HIGH (ref 7.350–7.450)
pH, Arterial: 7.36 (ref 7.350–7.450)
pH, Arterial: 7.373 (ref 7.350–7.450)
pO2, Arterial: 190 mmHg — ABNORMAL HIGH (ref 83.0–108.0)
pO2, Arterial: 246 mmHg — ABNORMAL HIGH (ref 83.0–108.0)
pO2, Arterial: 395 mmHg — ABNORMAL HIGH (ref 83.0–108.0)
pO2, Arterial: 334 mmHg — ABNORMAL HIGH (ref 83.0–108.0)
pO2, Arterial: 91 mmHg (ref 83.0–108.0)

## 2021-01-17 LAB — POCT I-STAT, CHEM 8
BUN: 7 mg/dL — ABNORMAL LOW (ref 8–23)
BUN: 8 mg/dL (ref 8–23)
BUN: 7 mg/dL — ABNORMAL LOW (ref 8–23)
BUN: 7 mg/dL — ABNORMAL LOW (ref 8–23)
BUN: 7 mg/dL — ABNORMAL LOW (ref 8–23)
Calcium, Ion: 1.22 mmol/L (ref 1.15–1.40)
Calcium, Ion: 1.22 mmol/L (ref 1.15–1.40)
Calcium, Ion: 1.06 mmol/L — ABNORMAL LOW (ref 1.15–1.40)
Calcium, Ion: 1.04 mmol/L — ABNORMAL LOW (ref 1.15–1.40)
Calcium, Ion: 1.23 mmol/L (ref 1.15–1.40)
Chloride: 107 mmol/L (ref 98–111)
Chloride: 105 mmol/L (ref 98–111)
Chloride: 104 mmol/L (ref 98–111)
Chloride: 106 mmol/L (ref 98–111)
Chloride: 108 mmol/L (ref 98–111)
Creatinine, Ser: 1 mg/dL (ref 0.44–1.00)
Creatinine, Ser: 1.1 mg/dL — ABNORMAL HIGH (ref 0.44–1.00)
Creatinine, Ser: 0.9 mg/dL (ref 0.44–1.00)
Creatinine, Ser: 0.9 mg/dL (ref 0.44–1.00)
Creatinine, Ser: 0.8 mg/dL (ref 0.44–1.00)
Glucose, Bld: 135 mg/dL — ABNORMAL HIGH (ref 70–99)
Glucose, Bld: 133 mg/dL — ABNORMAL HIGH (ref 70–99)
Glucose, Bld: 110 mg/dL — ABNORMAL HIGH (ref 70–99)
Glucose, Bld: 98 mg/dL (ref 70–99)
Glucose, Bld: 104 mg/dL — ABNORMAL HIGH (ref 70–99)
HCT: 30 % — ABNORMAL LOW (ref 36.0–46.0)
HCT: 26 % — ABNORMAL LOW (ref 36.0–46.0)
HCT: 24 % — ABNORMAL LOW (ref 36.0–46.0)
HCT: 20 % — ABNORMAL LOW (ref 36.0–46.0)
HCT: 29 % — ABNORMAL LOW (ref 36.0–46.0)
Hemoglobin: 10.2 g/dL — ABNORMAL LOW (ref 12.0–15.0)
Hemoglobin: 8.8 g/dL — ABNORMAL LOW (ref 12.0–15.0)
Hemoglobin: 8.2 g/dL — ABNORMAL LOW (ref 12.0–15.0)
Hemoglobin: 6.8 g/dL — CL (ref 12.0–15.0)
Hemoglobin: 9.9 g/dL — ABNORMAL LOW (ref 12.0–15.0)
Potassium: 3.7 mmol/L (ref 3.5–5.1)
Potassium: 3.8 mmol/L (ref 3.5–5.1)
Potassium: 4.9 mmol/L (ref 3.5–5.1)
Potassium: 5.1 mmol/L (ref 3.5–5.1)
Potassium: 4.5 mmol/L (ref 3.5–5.1)
Sodium: 142 mmol/L (ref 135–145)
Sodium: 141 mmol/L (ref 135–145)
Sodium: 140 mmol/L (ref 135–145)
Sodium: 140 mmol/L (ref 135–145)
Sodium: 142 mmol/L (ref 135–145)
TCO2: 26 mmol/L (ref 22–32)
TCO2: 28 mmol/L (ref 22–32)
TCO2: 26 mmol/L (ref 22–32)
TCO2: 27 mmol/L (ref 22–32)
TCO2: 26 mmol/L (ref 22–32)

## 2021-01-17 LAB — POCT I-STAT EG7
Acid-base deficit: 1 mmol/L (ref 0.0–2.0)
Bicarbonate: 23.8 mmol/L (ref 20.0–28.0)
Calcium, Ion: 1.04 mmol/L — ABNORMAL LOW (ref 1.15–1.40)
HCT: 19 % — ABNORMAL LOW (ref 36.0–46.0)
Hemoglobin: 6.5 g/dL — CL (ref 12.0–15.0)
O2 Saturation: 83 %
Potassium: 5.2 mmol/L — ABNORMAL HIGH (ref 3.5–5.1)
Sodium: 139 mmol/L (ref 135–145)
TCO2: 25 mmol/L (ref 22–32)
pCO2, Ven: 41.2 mmHg — ABNORMAL LOW (ref 44.0–60.0)
pH, Ven: 7.37 (ref 7.250–7.430)
pO2, Ven: 49 mmHg — ABNORMAL HIGH (ref 32.0–45.0)

## 2021-01-17 LAB — CBC
HCT: 31.8 % — ABNORMAL LOW (ref 36.0–46.0)
HCT: 34.7 % — ABNORMAL LOW (ref 36.0–46.0)
Hemoglobin: 10.6 g/dL — ABNORMAL LOW (ref 12.0–15.0)
Hemoglobin: 11.3 g/dL — ABNORMAL LOW (ref 12.0–15.0)
MCH: 29.9 pg (ref 26.0–34.0)
MCH: 29 pg (ref 26.0–34.0)
MCHC: 33.3 g/dL (ref 30.0–36.0)
MCHC: 32.6 g/dL (ref 30.0–36.0)
MCV: 89.6 fL (ref 80.0–100.0)
MCV: 89.2 fL (ref 80.0–100.0)
Platelets: 183 10*3/uL (ref 150–400)
Platelets: 106 10*3/uL — ABNORMAL LOW (ref 150–400)
RBC: 3.55 MIL/uL — ABNORMAL LOW (ref 3.87–5.11)
RBC: 3.89 MIL/uL (ref 3.87–5.11)
RDW: 14.2 % (ref 11.5–15.5)
RDW: 14.5 % (ref 11.5–15.5)
WBC: 7.5 10*3/uL (ref 4.0–10.5)
WBC: 10.5 10*3/uL (ref 4.0–10.5)
nRBC: 0 % (ref 0.0–0.2)
nRBC: 0 % (ref 0.0–0.2)

## 2021-01-17 LAB — URINALYSIS, ROUTINE W REFLEX MICROSCOPIC
Bilirubin Urine: NEGATIVE
Glucose, UA: 150 mg/dL — AB
Hgb urine dipstick: NEGATIVE
Ketones, ur: NEGATIVE mg/dL
Leukocytes,Ua: NEGATIVE
Nitrite: NEGATIVE
Protein, ur: NEGATIVE mg/dL
Specific Gravity, Urine: 1.006 (ref 1.005–1.030)
pH: 7 (ref 5.0–8.0)

## 2021-01-17 LAB — HEMOGLOBIN AND HEMATOCRIT, BLOOD
HCT: 24.4 % — ABNORMAL LOW (ref 36.0–46.0)
Hemoglobin: 8.1 g/dL — ABNORMAL LOW (ref 12.0–15.0)

## 2021-01-17 LAB — HEMOGLOBIN A1C
Hgb A1c MFr Bld: 9.1 % — ABNORMAL HIGH (ref 4.8–5.6)
Mean Plasma Glucose: 214.47 mg/dL

## 2021-01-17 LAB — HEPARIN LEVEL (UNFRACTIONATED): Heparin Unfractionated: 0.33 IU/mL (ref 0.30–0.70)

## 2021-01-17 LAB — PROTIME-INR
INR: 1.2 (ref 0.8–1.2)
Prothrombin Time: 15.5 seconds — ABNORMAL HIGH (ref 11.4–15.2)

## 2021-01-17 LAB — GLUCOSE, CAPILLARY
Glucose-Capillary: 177 mg/dL — ABNORMAL HIGH (ref 70–99)
Glucose-Capillary: 140 mg/dL — ABNORMAL HIGH (ref 70–99)
Glucose-Capillary: 120 mg/dL — ABNORMAL HIGH (ref 70–99)
Glucose-Capillary: 113 mg/dL — ABNORMAL HIGH (ref 70–99)
Glucose-Capillary: 114 mg/dL — ABNORMAL HIGH (ref 70–99)

## 2021-01-17 LAB — APTT: aPTT: 49 seconds — ABNORMAL HIGH (ref 24–36)

## 2021-01-17 LAB — PREPARE RBC (CROSSMATCH)

## 2021-01-17 LAB — PLATELET COUNT: Platelets: 105 10*3/uL — ABNORMAL LOW (ref 150–400)

## 2021-01-17 SURGERY — CORONARY ARTERY BYPASS GRAFTING (CABG)
Anesthesia: General | Site: Chest | Laterality: Right

## 2021-01-17 MED ORDER — ROCURONIUM BROMIDE 10 MG/ML (PF) SYRINGE
PREFILLED_SYRINGE | INTRAVENOUS | Status: AC
  Filled 2021-01-17: qty 60

## 2021-01-17 MED ORDER — NITROGLYCERIN IN D5W 200-5 MCG/ML-% IV SOLN
0.0000 ug/min | INTRAVENOUS | Status: DC
  Administered 2021-01-18: 75 ug/min via INTRAVENOUS
  Filled 2021-01-17: qty 250

## 2021-01-17 MED ORDER — ACETAMINOPHEN 160 MG/5ML PO SOLN
1000.0000 mg | Freq: Four times a day (QID) | ORAL | Status: AC
  Administered 2021-01-18 (×2): 1000 mg
  Filled 2021-01-17 (×2): qty 40.6

## 2021-01-17 MED ORDER — ORAL CARE MOUTH RINSE
15.0000 mL | OROMUCOSAL | Status: DC
  Administered 2021-01-17 – 2021-01-18 (×6): 15 mL via OROMUCOSAL

## 2021-01-17 MED ORDER — LEVOFLOXACIN IN D5W 750 MG/150ML IV SOLN
750.0000 mg | INTRAVENOUS | Status: AC
  Administered 2021-01-18: 750 mg via INTRAVENOUS
  Filled 2021-01-17: qty 150

## 2021-01-17 MED ORDER — FENTANYL CITRATE (PF) 100 MCG/2ML IJ SOLN
50.0000 ug | INTRAMUSCULAR | Status: DC | PRN
  Administered 2021-01-18: 100 ug via INTRAVENOUS
  Filled 2021-01-17: qty 2

## 2021-01-17 MED ORDER — LACTATED RINGERS IV SOLN: INTRAVENOUS | Status: DC

## 2021-01-17 MED ORDER — DEXTROSE 50 % IV SOLN: 0.0000 mL | INTRAVENOUS | Status: DC | PRN

## 2021-01-17 MED ORDER — HEPARIN SODIUM (PORCINE) 1000 UNIT/ML IJ SOLN
INTRAMUSCULAR | Status: DC | PRN
Start: 2021-01-17 — End: 2021-01-17
  Administered 2021-01-17: 23000 [IU] via INTRAVENOUS
  Administered 2021-01-17: 5000 [IU] via INTRAVENOUS

## 2021-01-17 MED ORDER — ONDANSETRON HCL 4 MG/2ML IJ SOLN
4.0000 mg | Freq: Four times a day (QID) | INTRAMUSCULAR | Status: DC | PRN
  Administered 2021-01-18 – 2021-01-19 (×3): 4 mg via INTRAVENOUS
  Filled 2021-01-17 (×4): qty 2

## 2021-01-17 MED ORDER — POTASSIUM CHLORIDE 10 MEQ/50ML IV SOLN
10.0000 meq | INTRAVENOUS | Status: AC
Start: 2021-01-17 — End: 2021-01-17

## 2021-01-17 MED ORDER — SODIUM CHLORIDE 0.9% FLUSH: 3.0000 mL | INTRAVENOUS | Status: DC | PRN

## 2021-01-17 MED ORDER — ALBUMIN HUMAN 5 % IV SOLN
250.0000 mL | INTRAVENOUS | Status: AC | PRN
  Administered 2021-01-17 – 2021-01-18 (×4): 12.5 g via INTRAVENOUS
  Filled 2021-01-17: qty 250

## 2021-01-17 MED ORDER — SODIUM CHLORIDE (PF) 0.9 % IJ SOLN
OROMUCOSAL | Status: DC | PRN
  Administered 2021-01-17 (×3): 4 mL via TOPICAL

## 2021-01-17 MED ORDER — ARTIFICIAL TEARS OPHTHALMIC OINT
TOPICAL_OINTMENT | OPHTHALMIC | Status: DC | PRN
  Administered 2021-01-17: 1 via OPHTHALMIC

## 2021-01-17 MED ORDER — LACTATED RINGERS IV SOLN: INTRAVENOUS | Status: DC | PRN

## 2021-01-17 MED ORDER — FAMOTIDINE IN NACL 20-0.9 MG/50ML-% IV SOLN
20.0000 mg | Freq: Two times a day (BID) | INTRAVENOUS | Status: AC
  Administered 2021-01-17 – 2021-01-18 (×2): 20 mg via INTRAVENOUS
  Filled 2021-01-17 (×2): qty 50

## 2021-01-17 MED ORDER — BISACODYL 5 MG PO TBEC
10.0000 mg | DELAYED_RELEASE_TABLET | Freq: Every day | ORAL | Status: DC
  Administered 2021-01-19 – 2021-01-27 (×7): 10 mg via ORAL
  Filled 2021-01-17 (×8): qty 2

## 2021-01-17 MED ORDER — BISACODYL 10 MG RE SUPP: 10.0000 mg | Freq: Every day | RECTAL | Status: DC

## 2021-01-17 MED ORDER — SODIUM CHLORIDE 0.9% FLUSH
3.0000 mL | Freq: Two times a day (BID) | INTRAVENOUS | Status: DC
  Administered 2021-01-18: 3 mL via INTRAVENOUS

## 2021-01-17 MED ORDER — CHLORHEXIDINE GLUCONATE CLOTH 2 % EX PADS
6.0000 | MEDICATED_PAD | Freq: Every day | CUTANEOUS | Status: DC
  Administered 2021-01-17 – 2021-01-22 (×6): 6 via TOPICAL

## 2021-01-17 MED ORDER — SODIUM CHLORIDE 0.9 % IV SOLN: 250.0000 mL | INTRAVENOUS | Status: DC

## 2021-01-17 MED ORDER — MAGNESIUM SULFATE 4 GM/100ML IV SOLN
4.0000 g | Freq: Once | INTRAVENOUS | Status: AC
  Administered 2021-01-17: 4 g via INTRAVENOUS
  Filled 2021-01-17: qty 100

## 2021-01-17 MED ORDER — MIDAZOLAM HCL 2 MG/2ML IJ SOLN: 2.0000 mg | INTRAMUSCULAR | Status: DC | PRN

## 2021-01-17 MED ORDER — CHLORHEXIDINE GLUCONATE 0.12 % MT SOLN
OROMUCOSAL | Status: AC
  Administered 2021-01-17: 15 mL via OROMUCOSAL
  Filled 2021-01-17: qty 15

## 2021-01-17 MED ORDER — SODIUM CHLORIDE 0.9 % IV SOLN: INTRAVENOUS | Status: DC

## 2021-01-17 MED ORDER — CHLORHEXIDINE GLUCONATE 0.12% ORAL RINSE (MEDLINE KIT)
15.0000 mL | Freq: Two times a day (BID) | OROMUCOSAL | Status: DC
  Administered 2021-01-17 – 2021-01-18 (×2): 15 mL via OROMUCOSAL

## 2021-01-17 MED ORDER — PHENYLEPHRINE 40 MCG/ML (10ML) SYRINGE FOR IV PUSH (FOR BLOOD PRESSURE SUPPORT)
PREFILLED_SYRINGE | INTRAVENOUS | Status: DC | PRN
  Administered 2021-01-17: 80 ug via INTRAVENOUS

## 2021-01-17 MED ORDER — PLASMA-LYTE A IV SOLN
INTRAVENOUS | Status: DC | PRN
  Administered 2021-01-17: 1000 mL via INTRAVASCULAR

## 2021-01-17 MED ORDER — METOPROLOL TARTRATE 5 MG/5ML IV SOLN
2.5000 mg | INTRAVENOUS | Status: DC | PRN
  Administered 2021-01-18: 5 mg via INTRAVENOUS
  Filled 2021-01-17: qty 5

## 2021-01-17 MED ORDER — ASPIRIN 81 MG PO CHEW
324.0000 mg | CHEWABLE_TABLET | Freq: Every day | ORAL | Status: DC
  Administered 2021-01-18: 324 mg
  Filled 2021-01-17: qty 4

## 2021-01-17 MED ORDER — ACETAMINOPHEN 650 MG RE SUPP
650.0000 mg | Freq: Once | RECTAL | Status: AC
  Administered 2021-01-17: 650 mg via RECTAL

## 2021-01-17 MED ORDER — INSULIN REGULAR(HUMAN) IN NACL 100-0.9 UT/100ML-% IV SOLN: INTRAVENOUS | Status: DC

## 2021-01-17 MED ORDER — ALBUMIN HUMAN 5 % IV SOLN: INTRAVENOUS | Status: DC | PRN

## 2021-01-17 MED ORDER — METOPROLOL TARTRATE 12.5 MG HALF TABLET: 12.5000 mg | ORAL_TABLET | Freq: Two times a day (BID) | ORAL | Status: DC

## 2021-01-17 MED ORDER — PANTOPRAZOLE SODIUM 40 MG PO TBEC
40.0000 mg | DELAYED_RELEASE_TABLET | Freq: Every day | ORAL | Status: DC
  Administered 2021-01-19 – 2021-01-27 (×9): 40 mg via ORAL
  Filled 2021-01-17 (×9): qty 1

## 2021-01-17 MED ORDER — ASPIRIN EC 325 MG PO TBEC
325.0000 mg | DELAYED_RELEASE_TABLET | Freq: Every day | ORAL | Status: DC
  Administered 2021-01-19: 325 mg via ORAL
  Filled 2021-01-17: qty 1

## 2021-01-17 MED ORDER — SODIUM CHLORIDE 0.9% FLUSH: 10.0000 mL | INTRAVENOUS | Status: DC | PRN

## 2021-01-17 MED ORDER — SODIUM CHLORIDE 0.9% FLUSH
10.0000 mL | Freq: Two times a day (BID) | INTRAVENOUS | Status: DC
  Administered 2021-01-17: 10 mL

## 2021-01-17 MED ORDER — MIDAZOLAM HCL 5 MG/5ML IJ SOLN
INTRAMUSCULAR | Status: DC | PRN
  Administered 2021-01-17: 1 mg via INTRAVENOUS
  Administered 2021-01-17: 3 mg via INTRAVENOUS

## 2021-01-17 MED ORDER — FENTANYL CITRATE PF 50 MCG/ML IJ SOSY: 50.0000 ug | PREFILLED_SYRINGE | INTRAMUSCULAR | Status: DC | PRN

## 2021-01-17 MED ORDER — VANCOMYCIN HCL 1000 MG IV SOLR
INTRAVENOUS | Status: DC | PRN
  Administered 2021-01-17: 1250 mg via INTRAVENOUS

## 2021-01-17 MED ORDER — METOPROLOL TARTRATE 25 MG/10 ML ORAL SUSPENSION: 12.5000 mg | Freq: Two times a day (BID) | ORAL | Status: DC

## 2021-01-17 MED ORDER — SODIUM CHLORIDE 0.45 % IV SOLN: INTRAVENOUS | Status: DC | PRN

## 2021-01-17 MED ORDER — ACETAMINOPHEN 160 MG/5ML PO SOLN: 650.0000 mg | Freq: Once | ORAL | Status: AC

## 2021-01-17 MED ORDER — FENTANYL CITRATE (PF) 250 MCG/5ML IJ SOLN
INTRAMUSCULAR | Status: AC
  Filled 2021-01-17: qty 5

## 2021-01-17 MED ORDER — VANCOMYCIN HCL IN DEXTROSE 1-5 GM/200ML-% IV SOLN
1000.0000 mg | Freq: Once | INTRAVENOUS | Status: AC
  Administered 2021-01-18: 1000 mg via INTRAVENOUS
  Filled 2021-01-17: qty 200

## 2021-01-17 MED ORDER — PROPOFOL 10 MG/ML IV BOLUS
INTRAVENOUS | Status: AC
  Filled 2021-01-17: qty 20

## 2021-01-17 MED ORDER — PROPOFOL 10 MG/ML IV BOLUS
INTRAVENOUS | Status: DC | PRN
  Administered 2021-01-17: 30 mg via INTRAVENOUS

## 2021-01-17 MED ORDER — DOBUTAMINE IN D5W 4-5 MG/ML-% IV SOLN: 0.0000 ug/kg/min | INTRAVENOUS | Status: DC

## 2021-01-17 MED ORDER — LACTATED RINGERS IV SOLN: 500.0000 mL | Freq: Once | INTRAVENOUS | Status: DC | PRN

## 2021-01-17 MED ORDER — ACETAMINOPHEN 500 MG PO TABS
1000.0000 mg | ORAL_TABLET | Freq: Four times a day (QID) | ORAL | Status: AC
  Administered 2021-01-18 – 2021-01-22 (×17): 1000 mg via ORAL
  Filled 2021-01-17 (×16): qty 2

## 2021-01-17 MED ORDER — MIDAZOLAM HCL (PF) 10 MG/2ML IJ SOLN
INTRAMUSCULAR | Status: AC
  Filled 2021-01-17: qty 2

## 2021-01-17 MED ORDER — 0.9 % SODIUM CHLORIDE (POUR BTL) OPTIME
TOPICAL | Status: DC | PRN
  Administered 2021-01-17: 5000 mL

## 2021-01-17 MED ORDER — CHLORHEXIDINE GLUCONATE 0.12 % MT SOLN
15.0000 mL | Freq: Once | OROMUCOSAL | Status: AC
  Administered 2021-01-17: 15 mL via OROMUCOSAL
  Filled 2021-01-17: qty 15

## 2021-01-17 MED ORDER — NOREPINEPHRINE 4 MG/250ML-% IV SOLN
0.0000 ug/min | INTRAVENOUS | Status: DC
  Administered 2021-01-18 – 2021-01-19 (×2): 2 ug/min via INTRAVENOUS
  Filled 2021-01-17 (×2): qty 250

## 2021-01-17 MED ORDER — ROCURONIUM BROMIDE 100 MG/10ML IV SOLN
INTRAVENOUS | Status: DC | PRN
  Administered 2021-01-17: 50 mg via INTRAVENOUS
  Administered 2021-01-17: 100 mg via INTRAVENOUS
  Administered 2021-01-17 (×2): 50 mg via INTRAVENOUS

## 2021-01-17 MED ORDER — CHLORHEXIDINE GLUCONATE CLOTH 2 % EX PADS: 6.0000 | MEDICATED_PAD | Freq: Every day | CUTANEOUS | Status: DC

## 2021-01-17 MED ORDER — PROTAMINE SULFATE 10 MG/ML IV SOLN
INTRAVENOUS | Status: DC | PRN
  Administered 2021-01-17: 20 mg via INTRAVENOUS
  Administered 2021-01-17 (×2): 10 mg via INTRAVENOUS
  Administered 2021-01-17: 30 mg via INTRAVENOUS
  Administered 2021-01-17: 200 mg via INTRAVENOUS
  Administered 2021-01-17: 10 mg via INTRAVENOUS

## 2021-01-17 MED ORDER — DEXMEDETOMIDINE HCL IN NACL 400 MCG/100ML IV SOLN
0.0000 ug/kg/h | INTRAVENOUS | Status: DC
  Administered 2021-01-17: 0.7 ug/kg/h via INTRAVENOUS
  Filled 2021-01-17: qty 200

## 2021-01-17 MED ORDER — FENTANYL CITRATE (PF) 250 MCG/5ML IJ SOLN
INTRAMUSCULAR | Status: DC | PRN
  Administered 2021-01-17: 150 ug via INTRAVENOUS
  Administered 2021-01-17: 100 ug via INTRAVENOUS
  Administered 2021-01-17: 150 ug via INTRAVENOUS
  Administered 2021-01-17: 50 ug via INTRAVENOUS
  Administered 2021-01-17: 100 ug via INTRAVENOUS
  Administered 2021-01-17: 50 ug via INTRAVENOUS
  Administered 2021-01-17: 200 ug via INTRAVENOUS
  Administered 2021-01-17: 50 ug via INTRAVENOUS
  Administered 2021-01-17 (×2): 100 ug via INTRAVENOUS

## 2021-01-17 MED ORDER — FENTANYL CITRATE (PF) 250 MCG/5ML IJ SOLN
INTRAMUSCULAR | Status: AC
  Filled 2021-01-17: qty 20

## 2021-01-17 SURGICAL SUPPLY — 87 items
BAG COUNTER SPONGE SURGICOUNT (BAG) ×5 IMPLANT
BAG DECANTER FOR FLEXI CONT (MISCELLANEOUS) ×5 IMPLANT
BAG SPNG CNTER NS LX DISP (BAG) ×4
BLADE CLIPPER SURG (BLADE) IMPLANT
BLADE STERNUM SYSTEM 6 (BLADE) ×5 IMPLANT
BNDG ELASTIC 4X5.8 VLCR STR LF (GAUZE/BANDAGES/DRESSINGS) ×5 IMPLANT
BNDG ELASTIC 6X5.8 VLCR STR LF (GAUZE/BANDAGES/DRESSINGS) ×5 IMPLANT
BNDG GAUZE ELAST 4 BULKY (GAUZE/BANDAGES/DRESSINGS) ×5 IMPLANT
CABLE SURGICAL S-101-97-12 (CABLE) ×5 IMPLANT
CANISTER SUCT 3000ML PPV (MISCELLANEOUS) ×5 IMPLANT
CANNULA MC2 2 STG 29/37 NON-V (CANNULA) ×4 IMPLANT
CANNULA MC2 TWO STAGE (CANNULA) ×5
CANNULA NON VENT 20FR 12 (CANNULA) ×5 IMPLANT
CATH ROBINSON RED A/P 18FR (CATHETERS) ×10 IMPLANT
CLIP RETRACTION 3.0MM CORONARY (MISCELLANEOUS) ×5 IMPLANT
CLIP VESOCCLUDE MED 24/CT (CLIP) IMPLANT
CLIP VESOCCLUDE SM WIDE 24/CT (CLIP) IMPLANT
CONN ST 1/2X1/2  BEN (MISCELLANEOUS) ×5
CONN ST 1/2X1/2 BEN (MISCELLANEOUS) ×4 IMPLANT
CONNECTOR BLAKE 2:1 CARIO BLK (MISCELLANEOUS) ×5 IMPLANT
CONTAINER PROTECT SURGISLUSH (MISCELLANEOUS) ×10 IMPLANT
DRAIN CHANNEL 19F RND (DRAIN) ×15 IMPLANT
DRAIN CONNECTOR BLAKE 1:1 (MISCELLANEOUS) ×5 IMPLANT
DRAPE CARDIOVASCULAR INCISE (DRAPES) ×5
DRAPE INCISE IOBAN 66X45 STRL (DRAPES) IMPLANT
DRAPE SRG 135X102X78XABS (DRAPES) ×4 IMPLANT
DRAPE WARM FLUID 44X44 (DRAPES) ×5 IMPLANT
DRSG AQUACEL AG ADV 3.5X10 (GAUZE/BANDAGES/DRESSINGS) ×5 IMPLANT
DRSG AQUACEL AG ADV 3.5X14 (GAUZE/BANDAGES/DRESSINGS) ×5 IMPLANT
DRSG COVADERM 4X14 (GAUZE/BANDAGES/DRESSINGS) IMPLANT
ELECT BLADE 4.0 EZ CLEAN MEGAD (MISCELLANEOUS) ×5
ELECT REM PT RETURN 9FT ADLT (ELECTROSURGICAL) ×10
ELECTRODE BLDE 4.0 EZ CLN MEGD (MISCELLANEOUS) ×4 IMPLANT
ELECTRODE REM PT RTRN 9FT ADLT (ELECTROSURGICAL) ×8 IMPLANT
FELT TEFLON 1X6 (MISCELLANEOUS) ×5 IMPLANT
GAUZE SPONGE 4X4 12PLY STRL (GAUZE/BANDAGES/DRESSINGS) ×10 IMPLANT
GAUZE SPONGE 4X4 12PLY STRL LF (GAUZE/BANDAGES/DRESSINGS) ×10 IMPLANT
GLOVE SURG ENC MOIS LTX SZ7 (GLOVE) ×10 IMPLANT
GLOVE SURG ENC TEXT LTX SZ7.5 (GLOVE) ×10 IMPLANT
GLOVE SURG MICRO LTX SZ7.5 (GLOVE) ×10 IMPLANT
GOWN STRL REUS W/ TWL LRG LVL3 (GOWN DISPOSABLE) ×16 IMPLANT
GOWN STRL REUS W/ TWL XL LVL3 (GOWN DISPOSABLE) ×8 IMPLANT
GOWN STRL REUS W/TWL LRG LVL3 (GOWN DISPOSABLE) ×20
GOWN STRL REUS W/TWL XL LVL3 (GOWN DISPOSABLE) ×10
HEMOSTAT POWDER SURGIFOAM 1G (HEMOSTASIS) ×15 IMPLANT
INSERT SUTURE HOLDER (MISCELLANEOUS) ×5 IMPLANT
KIT BASIN OR (CUSTOM PROCEDURE TRAY) ×5 IMPLANT
KIT SUCTION CATH 14FR (SUCTIONS) ×5 IMPLANT
KIT TURNOVER KIT B (KITS) ×5 IMPLANT
KIT VASOVIEW HEMOPRO 2 VH 4000 (KITS) ×5 IMPLANT
LEAD PACING MYOCARDI (MISCELLANEOUS) ×5 IMPLANT
MARKER GRAFT CORONARY BYPASS (MISCELLANEOUS) ×15 IMPLANT
NS IRRIG 1000ML POUR BTL (IV SOLUTION) ×25 IMPLANT
PACK ACCESSORY CANNULA KIT (KITS) ×5 IMPLANT
PACK E OPEN HEART (SUTURE) ×5 IMPLANT
PACK OPEN HEART (CUSTOM PROCEDURE TRAY) ×5 IMPLANT
PAD ARMBOARD 7.5X6 YLW CONV (MISCELLANEOUS) ×10 IMPLANT
PAD ELECT DEFIB RADIOL ZOLL (MISCELLANEOUS) ×5 IMPLANT
PENCIL BUTTON HOLSTER BLD 10FT (ELECTRODE) ×5 IMPLANT
POSITIONER HEAD DONUT 9IN (MISCELLANEOUS) ×5 IMPLANT
PUNCH AORTIC ROTATE 4.0MM (MISCELLANEOUS) ×10 IMPLANT
SET MPS 3-ND DEL (MISCELLANEOUS) ×5 IMPLANT
SUPPORT HEART JANKE-BARRON (MISCELLANEOUS) ×5 IMPLANT
SUT BONE WAX W31G (SUTURE) ×5 IMPLANT
SUT ETHIBOND X763 2 0 SH 1 (SUTURE) ×10 IMPLANT
SUT MNCRL AB 3-0 PS2 18 (SUTURE) ×10 IMPLANT
SUT PDS AB 1 CTX 36 (SUTURE) ×10 IMPLANT
SUT PROLENE 4 0 RB 1 (SUTURE) ×10
SUT PROLENE 4 0 SH DA (SUTURE) ×5 IMPLANT
SUT PROLENE 4-0 RB1 .5 CRCL 36 (SUTURE) ×8 IMPLANT
SUT PROLENE 5 0 C 1 36 (SUTURE) ×30 IMPLANT
SUT PROLENE 6 0 C 1 30 (SUTURE) ×5 IMPLANT
SUT PROLENE 7 0 BV 1 (SUTURE) ×15 IMPLANT
SUT PROLENE 7 0 BV1 MDA (SUTURE) ×10 IMPLANT
SUT STEEL 6MS V (SUTURE) ×10 IMPLANT
SUT VIC AB 1 CTX 36 (SUTURE) ×5
SUT VIC AB 1 CTX36XBRD ANBCTR (SUTURE) ×4 IMPLANT
SUT VIC AB 2-0 CT1 36 (SUTURE) ×5 IMPLANT
SUT VIC AB 3-0 X1 27 (SUTURE) ×5 IMPLANT
SYSTEM SAHARA CHEST DRAIN ATS (WOUND CARE) ×5 IMPLANT
TAPE CLOTH SURG 4X10 WHT LF (GAUZE/BANDAGES/DRESSINGS) ×5 IMPLANT
TOWEL GREEN STERILE (TOWEL DISPOSABLE) ×5 IMPLANT
TOWEL GREEN STERILE FF (TOWEL DISPOSABLE) ×5 IMPLANT
TRAY FOLEY SLVR 16FR TEMP STAT (SET/KITS/TRAYS/PACK) ×5 IMPLANT
TUBING LAP HI FLOW INSUFFLATIO (TUBING) ×5 IMPLANT
UNDERPAD 30X36 HEAVY ABSORB (UNDERPADS AND DIAPERS) ×5 IMPLANT
WATER STERILE IRR 1000ML POUR (IV SOLUTION) ×10 IMPLANT

## 2021-01-17 NOTE — Anesthesia Preprocedure Evaluation (Addendum)
Anesthesia Evaluation  Patient identified by MRN, date of birth, ID band Patient awake    Reviewed: Allergy & Precautions, NPO status , Patient's Chart, lab work & pertinent test results, reviewed documented beta blocker date and time   Airway Mallampati: III  TM Distance: >3 FB Neck ROM: Full    Dental  (+) Dental Advisory Given, Edentulous Upper   Pulmonary COPD,  COPD inhaler, Current Smoker and Patient abstained from smoking.,    Pulmonary exam normal        Cardiovascular hypertension, Pt. on medications and Pt. on home beta blockers + angina + CAD and + Peripheral Vascular Disease (h/o AAA)  Normal cardiovascular exam   '22 TTE: EF 45 to 50%. Mild concentric LVH, Grade I DD, mild TR, trivial MR. AV sclerosis without stenosis  '22 Cath - Prox RCA lesion is 85% stenosed. Colon Flattery LM lesion is 80% stenosed. Colon Flattery LAD lesion is 90% stenosed. .  Prox LAD lesion is 80% stenosed. .  Mid LAD to Dist LAD lesion is 70% stenosed. .  Prox Cx lesion is 90% stenosed. .  2nd Mrg lesion is 60% stenosed. .  3rd Mrg lesion is 70% stenosed. .  Previously placed Prox RCA to Mid RCA stent (unknown type) is  widely patent.  '22 Carotid US - 1-39% b/l ICAS    Neuro/Psych TIACVA    GI/Hepatic GERD  Medicated and Controlled,  Endo/Other  diabetes, Oral Hypoglycemic Agents  Renal/GU Renal InsufficiencyRenal disease  Female GU complaint     Musculoskeletal  (+) Arthritis ,   Abdominal   Peds  Hematology  (+) Blood dyscrasia (Hb 10.6), anemia ,   Anesthesia Other Findings   Reproductive/Obstetrics                            Anesthesia Physical Anesthesia Plan  ASA: 4  Anesthesia Plan: General   Post-op Pain Management:    Induction: Intravenous  PONV Risk Score and Plan: 2 and Treatment may vary due to age or medical condition  Airway Management Planned: Oral ETT  Additional Equipment:  Arterial line, TEE, Ultrasound Guidance Line Placement and CVP  Intra-op Plan:   Post-operative Plan: Post-operative intubation/ventilation  Informed Consent: I have reviewed the patients History and Physical, chart, labs and discussed the procedure including the risks, benefits and alternatives for the proposed anesthesia with the patient or authorized representative who has indicated his/her understanding and acceptance.     Dental advisory given  Plan Discussed with: CRNA and Anesthesiologist  Anesthesia Plan Comments:        Anesthesia Quick Evaluation

## 2021-01-17 NOTE — Progress Notes (Signed)
PROGRESS NOTE    [name]  ALP:[id number] DOB: 1940/08/31 DOA: 01/09/2021 PCP: [name], MD    Chief Complaint  Patient presents with   Chest Pain   Emesis    Brief Narrative:   [name] is an 80 year old female patient with a past medical history significant for an abdominal aortic aneurysm repair in 2012, diabetes mellitus type 2, hypertension, hyperlipidemia, acute CVA  in 2016, known coronary artery disease with a stent placed in 2015 in the RCA, femoral-popliteal bypass graft done in 2016, GERD, arthritis, current pack a day cigarette smoker, and chronic kidney disease stage III who presented to the emergency department with chest pain, her work-up significant for NSTEMI, she went for cardiac cath 8/16, which was significant for multivessel disease, CT surgery consulted regarding CABG. patient initially refusing CABG, Dr. Terrence Dupont consulted [facility] regarding high risk PCI, it was felt that the best approach would be for patient to go for surgical intervention with CABG ,CT surgery reconsulted, and plan for CABG on Monday.   Assessment & Plan:    NSTEMI (non-ST elevated myocardial infarction)  -Patient underwent cardiac cath 8/16, significant for multivessel disease.  CABG was recommended.  Patient was reluctant. -Dr. Daneen Schick was consulted regarding second opinion concerning suitability for high risk PCI, it was determined patient will be better served with CABG, currently patient is agreeable, so CT surgery was consulted.  Plan for CABG today. -Patient remains on aspirin, beta-blockers and heparin gtt.   -Patient was started on nitro drip given continued chest pain even at rest, unstable angina. Seems to be stable from cardiac standpoint.    Hyperlipidemia Continue with statin   Essential hypertension Reasonably well-controlled on amlodipine and metoprolol.     DM (diabetes mellitus) type II uncontrolled, periph vascular disorder HbA1c 9.7.  This is better than  previous value at 12 last year.  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  Continue to monitor urine output.  Avoid nephrotoxic agents.  Normocytic anemia No evidence of overt blood loss.  Hemoglobin remained stable.  Tobacco use Nicotine patch to prevent withdrawls. Pt encouraged to quit tobacco use for her health.   Musculoskeletal pain She will be started on Voltaren gel for her musculoskeletal right shoulder/neck pain    DVT prophylaxis: on heparin GTT Code Status: Patient is aware DNR has to be rescinded for Surgery. Family Communication: Discussed with patient Disposition: To be determined  Status is: Inpatient  Remains inpatient appropriate because:IV treatments appropriate due to intensity of illness or inability to take PO  Dispo: The patient is from: Home              Anticipated d/c is to: Home              Patient currently is not medically stable to d/c.   Difficult to place patient No       Consultants:  Cardiology CT surgery   Subjective: Patient denies any chest pain or shortness of breath.  She is nervous about her bypass surgery.  Otherwise no complaints offered.  Objective: Vitals:   01/16/21 2010 01/17/21 0435 01/17/21 0955 01/17/21 0956  BP: (!) 156/70 (!) 151/69 (!) 160/64   Pulse: 72 (!) 56  64  Resp: 17 20    Temp: 98.1 F (36.7 C) 98.1 F (36.7 C)    TempSrc: Oral Oral    SpO2: 97% 100%    Weight:      Height:        Intake/Output  Summary (Last 24 hours) at 01/17/2021 1108 Last data filed at 01/17/2021 0400 Gross per 24 hour  Intake 695.99 ml  Output --  Net 695.99 ml     Filed Weights   01/10/21 0900 01/10/21 2003  Weight: 74 kg 74 kg     Examination:  General appearance: Awake alert.  In no distress Full examination was not done as the patient was being prepped for surgery.      Data Reviewed: I have personally reviewed following labs and imaging studies  CBC: Recent Labs  Lab 01/13/21 0406  01/14/21 0126 01/15/21 0325 01/16/21 0233 01/16/21 1357 01/17/21 0318  WBC 11.0* 9.3 7.9 7.2  --  7.5  HGB 14.0 11.9* 10.9* 9.8* 11.6* 10.6*  HCT 42.0 36.5 33.4* 29.9* 34.0* 31.8*  MCV 88.4 89.5 90.0 89.3  --  89.6  PLT 172 179 166 170  --  183     Basic Metabolic Panel: Recent Labs  Lab 01/12/21 0129 01/13/21 0406 01/14/21 0126 01/15/21 0325 01/16/21 0233 01/16/21 1357  NA 139 138 137 137 135 140  K 4.0 4.5 3.9 4.0 3.7 3.8  CL 111 102 107 110 107  --   CO2 19* 22 22 20* 22  --   GLUCOSE 135* 160* 144* 151* 137*  --   BUN 13 15 18 15 11   --   CREATININE 1.18* 1.57* 1.60* 1.29* 1.26*  --   CALCIUM 8.8* 9.5 8.5* 8.3* 8.1*  --      GFR: Estimated Creatinine Clearance: 36.8 mL/min (A) (by C-G formula based on SCr of 1.26 mg/dL (H)).  CBG: Recent Labs  Lab 01/16/21 1103 01/16/21 1609 01/16/21 2128 01/17/21 0757 01/17/21 1052  GLUCAP 196* 148* 226* 177* 140*      Recent Results (from the past 240 hour(s))  Resp Panel by RT-PCR (Flu A&B, Covid) Nasopharyngeal Swab     Status: None   Collection Time: 01/09/21 12:18 AM   Specimen: Nasopharyngeal Swab; Nasopharyngeal(NP) swabs in vial transport medium  Result Value Ref Range Status   SARS Coronavirus 2 by RT PCR NEGATIVE NEGATIVE Final    Comment: (NOTE) SARS-CoV-2 target nucleic acids are NOT DETECTED.  The SARS-CoV-2 RNA is generally detectable in upper respiratory specimens during the acute phase of infection. The lowest concentration of SARS-CoV-2 viral copies this assay can detect is 138 copies/mL. A negative result does not preclude SARS-Cov-2 infection and should not be used as the sole basis for treatment or other patient management decisions. A negative result may occur with  improper specimen collection/handling, submission of specimen other than nasopharyngeal swab, presence of viral mutation(s) within the areas targeted by this assay, and inadequate number of viral copies(<138 copies/mL). A  negative result must be combined with clinical observations, patient history, and epidemiological information. The expected result is Negative.  Fact Sheet for Patients:  EntrepreneurPulse.com.au  Fact Sheet for Healthcare Providers:  IncredibleEmployment.be  This test is no t yet approved or cleared by the [place] FDA and  has been authorized for detection and/or diagnosis of SARS-CoV-2 by FDA under an Emergency Use Authorization (EUA). This EUA will remain  in effect (meaning this test can be used) for the duration of the COVID-19 declaration under Section 564(b)(1) of the Act, 21 U.S.C.section 360bbb-3(b)(1), unless the authorization is terminated  or revoked sooner.       Influenza A by PCR NEGATIVE NEGATIVE Final   Influenza B by PCR NEGATIVE NEGATIVE Final    Comment: (NOTE) The Xpert Xpress SARS-CoV-2/FLU/RSV  plus assay is intended as an aid in the diagnosis of influenza from Nasopharyngeal swab specimens and should not be used as a sole basis for treatment. Nasal washings and aspirates are unacceptable for Xpert Xpress SARS-CoV-2/FLU/RSV testing.  Fact Sheet for Patients: EntrepreneurPulse.com.au  Fact Sheet for Healthcare Providers: IncredibleEmployment.be  This test is not yet approved or cleared by the [place] FDA and has been authorized for detection and/or diagnosis of SARS-CoV-2 by FDA under an Emergency Use Authorization (EUA). This EUA will remain in effect (meaning this test can be used) for the duration of the COVID-19 declaration under Section 564(b)(1) of the Act, 21 U.S.C. section 360bbb-3(b)(1), unless the authorization is terminated or revoked.  Performed at [facility], [place] [address], [place], [place] [address]   Surgical pcr screen     Status: None   Collection Time: 01/13/21  5:21 PM   Specimen: Nasal Mucosa; Nasal Swab  Result Value Ref Range Status    MRSA, PCR NEGATIVE NEGATIVE Final   Staphylococcus aureus NEGATIVE NEGATIVE Final    Comment: (NOTE) The Xpert SA Assay (FDA approved for NASAL specimens in patients 30 years of age and older), is one component of a comprehensive surveillance program. It is not intended to diagnose infection nor to guide or monitor treatment. Performed at [facility], [place] [address], [place],  [address]           Radiology Studies: DG Chest 2 View  Result Date: 01/17/2021 CLINICAL DATA:  Preoperative evaluation for CABG and TEE EXAM: CHEST - 2 VIEW COMPARISON:  Portable exam 0527 hours compared to 01/16/2021 FINDINGS: Normal heart size, mediastinal contours, and pulmonary vascularity. Atherosclerotic calcification aorta. Chronic interstitial prominence unchanged since at least 05/28/2018. Subsegmental atelectasis versus scarring mid RIGHT lung. No definite acute infiltrate, pleural effusion, or pneumothorax. Bones demineralized. IMPRESSION: Chronic interstitial lung disease with minimal atelectasis versus scarring mid RIGHT lung. Aortic Atherosclerosis (ICD10-I70.0). Electronically Signed   By: Lavonia Dana M.D.   On: 01/17/2021 08:12   DG CHEST PORT 1 VIEW  Result Date: 01/16/2021 CLINICAL DATA:  Preop CABG EXAM: PORTABLE CHEST 1 VIEW COMPARISON:  01/09/2021 FINDINGS: Mild elevation of the right hemidiaphragm. Heart is normal size. Peribronchial thickening. No confluent opacities or effusions. No acute bony abnormality. IMPRESSION: Mild elevation of the right hemidiaphragm. Mild bronchitic changes. Electronically Signed   By: Rolm Baptise M.D.   On: 01/16/2021 16:46   VAS US DOPPLER PRE CABG  Result Date: 01/16/2021 PREOPERATIVE VASCULAR EVALUATION Patient Name:  JOWANNA LOEFFLER  Date of Exam:   01/16/2021 Medical Rec #: [id number]       Accession #:    [id number] Date of Birth: Mar 24, 1941        Patient Gender: F Patient Age:   51 years Exam Location:  [facility] Procedure:      VAS US  DOPPLER PRE CABG Referring Phys: HARRELL LIGHTFOOT --------------------------------------------------------------------------------  Indications:            Pre-CABG. Risk Factors:           Hypertension, hyperlipidemia, Diabetes, current smoker,                         coronary artery disease, prior CVA, PAD. Other Factors:          AAA repair 2007. 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 Vascular Interventions: 07/06/2014 -Left femoral to below pop BPG. Comparison Study:       Prior carotid duplex indicating 1-39% bilateral ICA                         stenosis, done 06/19/19. Prior ABI done 06/19/19. Right                         ABI: 0.54 Left ABI:0.73 Right great toe:0.47 Left great                         toe:0.45 Performing Technologist: Sharion Dove RVS  Examination Guidelines: A complete evaluation includes B-mode imaging, spectral Doppler, color Doppler, and power Doppler as needed of all accessible portions of each vessel. Bilateral testing is considered an integral part of a complete examination. Limited examinations for reoccurring indications may be performed as noted.  Right Carotid Findings: +----------+--------+--------+--------+----------------------+--------+           PSV cm/sEDV cm/sStenosisDescribe              Comments +----------+--------+--------+--------+----------------------+--------+ CCA Prox  199     34              homogeneous                    +----------+--------+--------+--------+----------------------+--------+ CCA Mid   211     34              heterogenous                   +----------+--------+--------+--------+----------------------+--------+ CCA Distal147     29              heterogenous                   +----------+--------+--------+--------+----------------------+--------+ ICA Prox  188     24      1-39%   irregular and calcific          +----------+--------+--------+--------+----------------------+--------+ ICA Distal55      17                                             +----------+--------+--------+--------+----------------------+--------+ ECA       153     27                                             +----------+--------+--------+--------+----------------------+--------+ +----------+--------+-------+--------+------------+           PSV cm/sEDV cmsDescribeArm Pressure +----------+--------+-------+--------+------------+ Subclavian107                                 +----------+--------+-------+--------+------------+ +---------+--------+--+--------+--+ VertebralPSV cm/s59EDV cm/s16 +---------+--------+--+--------+--+ Left Carotid Findings: +----------+--------+--------+--------+----------------------------+--------+           PSV cm/sEDV cm/sStenosisDescribe                    Comments +----------+--------+--------+--------+----------------------------+--------+ CCA Prox  88      17  heterogenous and homogeneous         +----------+--------+--------+--------+----------------------------+--------+ CCA Mid   189     25              homogeneous                          +----------+--------+--------+--------+----------------------------+--------+ CCA Distal134     25              homogeneous                          +----------+--------+--------+--------+----------------------------+--------+ ICA Prox  71      18      1-39%   heterogenous                         +----------+--------+--------+--------+----------------------------+--------+ ICA Distal75      24                                                   +----------+--------+--------+--------+----------------------------+--------+ ECA       119     4                                                    +----------+--------+--------+--------+----------------------------+--------+  +----------+--------+--------+--------+------------+ SubclavianPSV cm/sEDV cm/sDescribeArm Pressure +----------+--------+--------+--------+------------+           136                                  +----------+--------+--------+--------+------------+ +---------+--------+--+--------+--+ VertebralPSV cm/s68EDV cm/s17 +---------+--------+--+--------+--+  ABI Findings: +---------+------------------+-----+-------------------+--------+ Right    Rt Pressure (mmHg)IndexWaveform           Comment  +---------+------------------+-----+-------------------+--------+ Brachial 148                    biphasic                    +---------+------------------+-----+-------------------+--------+ PTA      72                0.46 dampened monophasic         +---------+------------------+-----+-------------------+--------+ DP       70                0.45 dampened monophasic         +---------+------------------+-----+-------------------+--------+ Great Toe16                0.10                             +---------+------------------+-----+-------------------+--------+ +---------+------------------+-----+----------+-------+ Left     Lt Pressure (mmHg)IndexWaveform  Comment +---------+------------------+-----+----------+-------+ Brachial 155                    biphasic          +---------+------------------+-----+----------+-------+ PTA      90                0.58 monophasic        +---------+------------------+-----+----------+-------+ DP       55  0.35 monophasic        +---------+------------------+-----+----------+-------+ Great Toe50                0.32                   +---------+------------------+-----+----------+-------+ +-------+---------------+----------------+ ABI/TBIToday's ABI/TBIPrevious ABI/TBI +-------+---------------+----------------+ Right  0.46           0.54             +-------+---------------+----------------+  Left   0.58           0.73             +-------+---------------+----------------+ Right ABIs appear decreased compared to prior study on 06/19/19. Left ABIs appear decreased compared to prior study on 06/19/19. Bilateral TBIs appear decreased since study done 06/19/19 Right Doppler Findings: +--------+--------+-----+--------+--------+ Site    PressureIndexDoppler Comments +--------+--------+-----+--------+--------+ [id number]          biphasic         +--------+--------+-----+--------+--------+ Radial               biphasic         +--------+--------+-----+--------+--------+ Ulnar                biphasic         +--------+--------+-----+--------+--------+  Left Doppler Findings: +--------+--------+-----+--------+--------+ Site    PressureIndexDoppler Comments +--------+--------+-----+--------+--------+ [id number]          biphasic         +--------+--------+-----+--------+--------+ Radial               biphasic         +--------+--------+-----+--------+--------+ Ulnar                biphasic         +--------+--------+-----+--------+--------+  Summary: Right Carotid: Velocities in the right ICA are consistent with a 1-39% stenosis. Left Carotid: Velocities in the left ICA are consistent with a 1-39% stenosis. Right ABI: Resting right ankle-brachial index indicates severe right lower extremity arterial disease. The right toe-brachial index is abnormal. Left ABI: Resting left ankle-brachial index indicates moderate left lower extremity arterial disease. The left toe-brachial index is abnormal. Right Upper Extremity: Doppler waveform obliterate with right radial compression. Doppler waveforms remain within normal limits with right ulnar compression. Left Upper Extremity: Doppler waveforms decrease 50% with left radial compression. Doppler waveforms remain within normal limits with left ulnar compression.  Electronically signed by Deitra Mayo MD on 01/16/2021 at  7:21:29 PM.    Final         Scheduled Meds:  [MAR Hold] amLODipine  5 mg Oral Daily   [MAR Hold] aspirin EC  81 mg Oral Daily   [MAR Hold] atorvastatin  80 mg Oral Daily   bisacodyl  5 mg Oral Once   [MAR Hold] bisacodyl  5 mg Oral Once   chlorhexidine       diclofenac Sodium  2 g Topical TID   epinephrine  0-10 mcg/min Intravenous To OR   heparin-papaverine-plasmalyte irrigation   Irrigation To OR   [MAR Hold] insulin aspart  0-5 Units Subcutaneous QHS   [MAR Hold] insulin aspart  0-9 Units Subcutaneous TID WC   insulin   Intravenous To OR   [facility] Blood Cardioplegia vial (lidocaine/magnesium/mannitol 0.26g-4g-6.4g)   Intracoronary To OR   [MAR Hold] metoprolol tartrate  25 mg Oral BID   [MAR Hold] nicotine  14 mg Transdermal Daily   [MAR Hold] pantoprazole  40 mg Oral Daily   phenylephrine  30-200 mcg/min Intravenous To  OR   potassium chloride  80 mEq Other To OR   [MAR Hold] sodium chloride flush  3 mL Intravenous Q12H   [MAR Hold] sodium chloride flush  3 mL Intravenous Q12H   tranexamic acid  15 mg/kg Intravenous To OR   tranexamic acid  2 mg/kg Intracatheter To OR   Continuous Infusions:  [MAR Hold] sodium chloride     dexmedetomidine     heparin 30,000 units/NS 1000 mL solution for CELLSAVER     heparin 1,000 Units/hr (01/16/21 2000)   lactated ringers 50 mL/hr at 01/16/21 1711   levofloxacin (LEVAQUIN) IV     milrinone     nitroGLYCERIN 35 mcg/min (01/16/21 2219)   nitroGLYCERIN     norepinephrine     tranexamic acid (CYKLOKAPRON) infusion ([facility])     vancomycin       LOS: 7 days     Bonnielee Haff, MD [facility]   To contact the attending provider between 7A-7P or the covering provider during after hours 7P-7A, please log into the web site [email or web address] and access using universal [facility] password for that web site. If you do not have the password, please call the hospital operator.  01/17/2021, 11:08 AM

## 2021-01-17 NOTE — Anesthesia Postprocedure Evaluation (Signed)
Anesthesia Post Note  Patient: [name]  Procedure(s) Performed: CORONARY ARTERY BYPASS GRAFTING (CABG) TIMES 3 , ON PUMP, USING LEFT INTERNAL MAMMARY ARTERY AND ENDOSCOPICALLY HARVESTED RIGHT GREATER SAPHENOUS VEIN (Chest) TRANSESOPHAGEAL ECHOCARDIOGRAM (TEE) APPLICATION OF CELL SAVER ENDOVEIN HARVEST OF GREATER SAPHENOUS VEIN (Right)     Patient location during evaluation: SICU Anesthesia Type: General Level of consciousness: sedated Pain management: pain level controlled Vital Signs Assessment: post-procedure vital signs reviewed and stable Respiratory status: patient remains intubated per anesthesia plan Cardiovascular status: stable Postop Assessment: no apparent nausea or vomiting Anesthetic complications: no   No notable events documented.  Last Vitals:  Vitals:   01/17/21 2030 01/17/21 2045  BP:    Pulse:    Resp: 11 12  Temp: 36.6 C 36.5 C  SpO2: 100% 100%    Last Pain:  Vitals:   01/17/21 2000  TempSrc: Bladder  PainSc:                  [name]

## 2021-01-17 NOTE — Progress Notes (Signed)
  Echocardiogram Echocardiogram Transesophageal has been performed.  [name] 01/17/2021, 2:52 PM

## 2021-01-17 NOTE — Progress Notes (Signed)
     [address]       [place] [address]             [phone]       No events  Vitals:   01/16/21 2010 01/17/21 0435  BP: (!) 156/70 (!) 151/69  Pulse: 72 (!) 56  Resp: 17 20  Temp: 98.1 F (36.7 C) 98.1 F (36.7 C)  SpO2: 97% 100%   Alert NAD Sinus EWOB  OR today for CABG  Mena Simonis O Vianny Schraeder

## 2021-01-17 NOTE — Anesthesia Procedure Notes (Signed)
Arterial Line Insertion Start/End8/22/2022 12:15 PM, 01/17/2021 12:30 PM Performed by: Josephine Igo, CRNA, CRNA  Preanesthetic checklist: patient identified, IV checked, risks and benefits discussed, surgical consent and pre-op evaluation Lidocaine 1% used for infiltration Left, radial was placed Catheter size: 20 G Hand hygiene performed  and maximum sterile barriers used   Attempts: 1 Procedure performed without using ultrasound guided technique. Following insertion, dressing applied and Biopatch. Post procedure assessment: normal

## 2021-01-17 NOTE — Brief Op Note (Signed)
01/09/2021 - 01/17/2021  11:00 AM  PATIENT:  [name]  80 y.o. female  PRE-OPERATIVE DIAGNOSIS:  Coronary Artery Disease  POST-OPERATIVE DIAGNOSIS:  Coronary Artery Disease  PROCEDURE:  Procedure(s): CORONARY ARTERY BYPASS GRAFTING (CABG) TIMES 3 , ON PUMP, USING LEFT INTERNAL MAMMARY ARTERY AND ENDOSCOPICALLY HARVESTED RIGHT GREATER SAPHENOUS VEIN (N/A) TRANSESOPHAGEAL ECHOCARDIOGRAM (TEE) (N/A) APPLICATION OF CELL SAVER ENDOVEIN HARVEST OF GREATER SAPHENOUS VEIN (Right) [place] SVG-OM1 SVG-D RCA EVH 65 MIN  SURGEON:  Surgeon(s) and Role:    * Lightfoot, Lucile Crater, MD - Primary  PHYSICIAN ASSISTANT: Shad Ledvina PA-C  ASSISTANTS: STAFF   ANESTHESIA:   general  EBL:  SEE ANESTHESIA AND PERFUSION RECORDS  BLOOD ADMINISTERED: 2 UNITS PRBC'S  DRAINS:  LEFT PLEURAL AND MEDIASTINAL CHEST TUBES    LOCAL MEDICATIONS USED:  NONE  SPECIMEN:  No Specimen  DISPOSITION OF SPECIMEN:  N/A  COUNTS:  YES  TOURNIQUET:  * No tourniquets in log *  DICTATION: .Dragon Dictation  PLAN OF CARE: Admit to inpatient   PATIENT DISPOSITION:  ICU - intubated and hemodynamically stable.   Delay start of Pharmacological VTE agent (>24hrs) due to surgical blood loss or risk of bleeding: yes  COMPLICATIONS: NO KNOWN  FINDINGS SEVERELY CALCIFIED AORTA

## 2021-01-17 NOTE — Progress Notes (Signed)
Subjective:  The patient denies any chest pain or shortness of breath.  States slept well last night, scheduled for CABG later today.  States has been using incentive spirometry  Objective:  Vital Signs in the last 24 hours: Temp:  [98.1 F (36.7 C)] 98.1 F (36.7 C) (08/22 0435) Pulse Rate:  [56-72] 64 (08/22 0956) Resp:  [17-20] 20 (08/22 0435) BP: (141-160)/(56-70) 160/64 (08/22 0955) SpO2:  [96 %-100 %] 100 % (08/22 0435)  Intake/Output from previous day: 08/21 0701 - 08/22 0700 In: 696 [I.V.:696] Out: -  Intake/Output from this shift: No intake/output data recorded.  Physical Exam: Exam unchanged  Lab Results: Recent Labs    01/16/21 0233 01/16/21 1357 01/17/21 0318  WBC 7.2  --  7.5  HGB 9.8* 11.6* 10.6*  PLT 170  --  183   Recent Labs    01/15/21 0325 01/16/21 0233 01/16/21 1357  NA 137 135 140  K 4.0 3.7 3.8  CL 110 107  --   CO2 20* 22  --   GLUCOSE 151* 137*  --   BUN 15 11  --   CREATININE 1.29* 1.26*  --    No results for input(s): TROPONINI in the last 72 hours.  Invalid input(s): CK, MB Hepatic Function Panel No results for input(s): PROT, ALBUMIN, AST, ALT, ALKPHOS, BILITOT, BILIDIR, IBILI in the last 72 hours. No results for input(s): CHOL in the last 72 hours. No results for input(s): PROTIME in the last 72 hours.  Imaging: DG Chest 2 View  Result Date: 01/17/2021 CLINICAL DATA:  Preoperative evaluation for CABG and TEE EXAM: CHEST - 2 VIEW COMPARISON:  Portable exam 0527 hours compared to 01/16/2021 FINDINGS: Normal heart size, mediastinal contours, and pulmonary vascularity. Atherosclerotic calcification aorta. Chronic interstitial prominence unchanged since at least 05/28/2018. Subsegmental atelectasis versus scarring mid RIGHT lung. No definite acute infiltrate, pleural effusion, or pneumothorax. Bones demineralized. IMPRESSION: Chronic interstitial lung disease with minimal atelectasis versus scarring mid RIGHT lung. Aortic  Atherosclerosis (ICD10-I70.0). Electronically Signed   By: Lavonia Dana M.D.   On: 01/17/2021 08:12   DG CHEST PORT 1 VIEW  Result Date: 01/16/2021 CLINICAL DATA:  Preop CABG EXAM: PORTABLE CHEST 1 VIEW COMPARISON:  01/09/2021 FINDINGS: Mild elevation of the right hemidiaphragm. Heart is normal size. Peribronchial thickening. No confluent opacities or effusions. No acute bony abnormality. IMPRESSION: Mild elevation of the right hemidiaphragm. Mild bronchitic changes. Electronically Signed   By: Rolm Baptise M.D.   On: 01/16/2021 16:46   VAS US DOPPLER PRE CABG  Result Date: 01/16/2021 PREOPERATIVE VASCULAR EVALUATION Patient Name:  [name]  Date of Exam:   01/16/2021 Medical Rec #: [id number]       Accession #:    [id number] Date of Birth: Dec 03, 1940        Patient Gender: F Patient Age:   80 years Exam Location:  [facility] Procedure:      VAS US DOPPLER PRE CABG Referring Phys: HARRELL LIGHTFOOT --------------------------------------------------------------------------------  Indications:            Pre-CABG. Risk Factors:           Hypertension, hyperlipidemia, Diabetes, current smoker,                         coronary artery disease, prior CVA, PAD. Other Factors:          AAA repair 2007. 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 Vascular Interventions: 07/06/2014 -Left femoral to below pop BPG. Comparison Study:       Prior carotid duplex indicating 1-39% bilateral ICA                         stenosis, done 06/19/19. Prior ABI done 06/19/19. Right                         ABI: 0.54 Left ABI:0.73 Right great toe:0.47 Left great                         toe:0.45 Performing Technologist: Sharion Dove RVS  Examination Guidelines: A complete evaluation includes B-mode imaging, spectral Doppler, color Doppler, and power Doppler as needed of all accessible portions of each vessel. Bilateral testing  is considered an integral part of a complete examination. Limited examinations for reoccurring indications may be performed as noted.  Right Carotid Findings: +----------+--------+--------+--------+----------------------+--------+           PSV cm/sEDV cm/sStenosisDescribe              Comments +----------+--------+--------+--------+----------------------+--------+ CCA Prox  199     34              homogeneous                    +----------+--------+--------+--------+----------------------+--------+ CCA Mid   211     34              heterogenous                   +----------+--------+--------+--------+----------------------+--------+ CCA Distal147     29              heterogenous                   +----------+--------+--------+--------+----------------------+--------+ ICA Prox  188     24      1-39%   irregular and calcific         +----------+--------+--------+--------+----------------------+--------+ ICA Distal55      17                                             +----------+--------+--------+--------+----------------------+--------+ ECA       153     27                                             +----------+--------+--------+--------+----------------------+--------+ +----------+--------+-------+--------+------------+           PSV cm/sEDV cmsDescribeArm Pressure +----------+--------+-------+--------+------------+ Subclavian107                                 +----------+--------+-------+--------+------------+ +---------+--------+--+--------+--+ VertebralPSV cm/s59EDV cm/s16 +---------+--------+--+--------+--+ Left Carotid Findings: +----------+--------+--------+--------+----------------------------+--------+           PSV cm/sEDV cm/sStenosisDescribe                    Comments +----------+--------+--------+--------+----------------------------+--------+ CCA Prox  88      17  heterogenous and homogeneous          +----------+--------+--------+--------+----------------------------+--------+ CCA Mid   189     25              homogeneous                          +----------+--------+--------+--------+----------------------------+--------+ CCA Distal134     25              homogeneous                          +----------+--------+--------+--------+----------------------------+--------+ ICA Prox  71      18      1-39%   heterogenous                         +----------+--------+--------+--------+----------------------------+--------+ ICA Distal75      24                                                   +----------+--------+--------+--------+----------------------------+--------+ ECA       119     4                                                    +----------+--------+--------+--------+----------------------------+--------+ +----------+--------+--------+--------+------------+ SubclavianPSV cm/sEDV cm/sDescribeArm Pressure +----------+--------+--------+--------+------------+           136                                  +----------+--------+--------+--------+------------+ +---------+--------+--+--------+--+ VertebralPSV cm/s68EDV cm/s17 +---------+--------+--+--------+--+  ABI Findings: +---------+------------------+-----+-------------------+--------+ Right    Rt Pressure (mmHg)IndexWaveform           Comment  +---------+------------------+-----+-------------------+--------+ Brachial 148                    biphasic                    +---------+------------------+-----+-------------------+--------+ PTA      72                0.46 dampened monophasic         +---------+------------------+-----+-------------------+--------+ DP       70                0.45 dampened monophasic         +---------+------------------+-----+-------------------+--------+ Great Toe16                0.10                              +---------+------------------+-----+-------------------+--------+ +---------+------------------+-----+----------+-------+ Left     Lt Pressure (mmHg)IndexWaveform  Comment +---------+------------------+-----+----------+-------+ Brachial 155                    biphasic          +---------+------------------+-----+----------+-------+ PTA      90                0.58 monophasic        +---------+------------------+-----+----------+-------+ DP  55                0.35 monophasic        +---------+------------------+-----+----------+-------+ Great Toe50                0.32                   +---------+------------------+-----+----------+-------+ +-------+---------------+----------------+ ABI/TBIToday's ABI/TBIPrevious ABI/TBI +-------+---------------+----------------+ Right  0.46           0.54             +-------+---------------+----------------+ Left   0.58           0.73             +-------+---------------+----------------+ Right ABIs appear decreased compared to prior study on 06/19/19. Left ABIs appear decreased compared to prior study on 06/19/19. Bilateral TBIs appear decreased since study done 06/19/19 Right Doppler Findings: +--------+--------+-----+--------+--------+ Site    PressureIndexDoppler Comments +--------+--------+-----+--------+--------+ [id number]          biphasic         +--------+--------+-----+--------+--------+ Radial               biphasic         +--------+--------+-----+--------+--------+ Ulnar                biphasic         +--------+--------+-----+--------+--------+  Left Doppler Findings: +--------+--------+-----+--------+--------+ Site    PressureIndexDoppler Comments +--------+--------+-----+--------+--------+ [id number]          biphasic         +--------+--------+-----+--------+--------+ Radial               biphasic         +--------+--------+-----+--------+--------+ Ulnar                 biphasic         +--------+--------+-----+--------+--------+  Summary: Right Carotid: Velocities in the right ICA are consistent with a 1-39% stenosis. Left Carotid: Velocities in the left ICA are consistent with a 1-39% stenosis. Right ABI: Resting right ankle-brachial index indicates severe right lower extremity arterial disease. The right toe-brachial index is abnormal. Left ABI: Resting left ankle-brachial index indicates moderate left lower extremity arterial disease. The left toe-brachial index is abnormal. Right Upper Extremity: Doppler waveform obliterate with right radial compression. Doppler waveforms remain within normal limits with right ulnar compression. Left Upper Extremity: Doppler waveforms decrease 50% with left radial compression. Doppler waveforms remain within normal limits with left ulnar compression.  Electronically signed by Deitra Mayo MD on 01/16/2021 at 7:21:29 PM.    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  scheduled for CABG in the future History of inferolateral STEMI in November 2015.  Status post PCI to RCA in November 2015 Hypertension. Hyperlipidemia. Diabetes mellitus. History of CVA. Peripheral vascular disease, history of renal stenting and femoral popliteal bypass in the past. 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 Chronic kidney disease, stage IIIB, Tobacco abuse. Plan Continue present management. Schedule for CABG later today  LOS: 7 days    [name] 01/17/2021, 11:00 AM

## 2021-01-17 NOTE — Op Note (Signed)
[address]       [place] [address]             [phone]                                          01/17/2021 Patient:  [name] Pre-Op Dx: Coronary artery disease   Peripheral vascular disease   Hypertension   Hyperlipidemia   Diabetes mellitus with a hemoglobin A1c of 9.1  Post-op Dx: Same Procedure: CABG X 3, LIMA LAD, reverse saphenous vein graft to distal right coronary artery, reverse saphenous vein graft to OM1. Endoscopic greater saphenous vein harvest on the right   Surgeon and Role:      * Naaman Curro, Lucile Crater, MD - Primary    Evonnie Pat, PA-C- assisting  Anesthesia  general EBL: 500 ml Blood Administration: 2 units of packed red blood cells Xclamp Time:  62 min Pump Time:  73min  Drains: 69 F blake drain: L, mediastinal  Wires: None Counts: correct   Indications: 80 year old female admitted following an NSTEMI.  She underwent a left heart cath which showed significant three-vessel coronary artery disease.  Originally she did not want to proceed with surgery but given the lack of percutaneous options she ultimately agreed to proceed with surgical revascularization.  Findings: Heavily calcified aorta.  Calcified LAD.  Intramyocardial target was free of disease.  Calcified OM target.  Calcified distal RCA target.  Good LIMA.  Small vein.  Performed under single cross-clamp due to calcific disease.  RCA vein jumped off the hood of the OM vein graft.  Operative Technique: All invasive lines were placed in pre-op holding.  After the risks, benefits and alternatives were thoroughly discussed, the patient was brought to the operative theatre.  Anesthesia was induced, and the patient was prepped and draped in normal sterile fashion.  An appropriate surgical pause was performed, and pre-operative antibiotics were dosed accordingly.  We began with simultaneous incisions along the right leg for harvesting of the greater saphenous vein and  the chest for the sternotomy.  In regards to the sternotomy, this was carried down with bovie cautery, and the sternum was divided with a reciprocating saw.  Meticulous hemostasis was obtained.  The left internal thoracic artery was exposed and harvested in in pedicled fashion.  The patient was systemically heparinized, and the artery was divided distally, and placed in a papaverine sponge.    The sternal elevator was removed, and a retractor was placed.  The pericardium was divided in the midline and fashioned into a cradle with pericardial stitches.   After we confirmed an appropriate ACT, the ascending aorta was cannulated in standard fashion.  The right atrial appendage was used for venous cannulation site.  Cardiopulmonary bypass was initiated, and the heart retractor was placed. The cross clamp was applied, and a dose of anterograde cardioplegia was given with good arrest of the heart.  We moved to the posterior wall of the heart, and found a good target on the distal RCA.  An arteriotomy was made, and the vein graft was anastomosed to it in an end to side fashion.  Next we exposed the lateral wall, and found a good target on obtuse marginal.  An end to side anastomosis with the vein graft was then created.  Finally, we exposed a good target on the LAD, and fashioned  an end to side anastomosis between it and the LITA.  We then moved to the ascending aorta.  Given that it was heavily calcified there was only 1 good spot for proximal vein graft.  I would.  Side anastomosis between the aorta and the obtuse marginal vein graft.  We began to re-warm, and a re-animation dose of cardioplegia was given.  The heart was de-aired, and the cross clamp was removed.  Meticulous hemostasis was obtained.    The proximal vein graft to the distal RCA was then jumped off the hood of the obtuse marginal vein graft.  1 ring was used to mark the sequential proximal.  Hemostasis was obtained, and we separated from  cardiopulmonary bypass without event.  The heparin was reversed with protamine.  Chest tubes and wires were placed, and the sternum was re-approximated with sternal wires.  The soft tissue and skin were re-approximated wth absorbable suture.    The patient tolerated the procedure without any immediate complications, and was transferred to the ICU in guarded condition.  [name] [name]

## 2021-01-17 NOTE — Anesthesia Procedure Notes (Addendum)
  Central Venous Catheter Insertion Performed by: Audry Pili, MD, anesthesiologist Start/End8/22/2022 1:30 PM, 01/17/2021 1:37 PM Patient location: Pre-op. Preanesthetic checklist: patient identified, IV checked, risks and benefits discussed, surgical consent, monitors and equipment checked, pre-op evaluation, timeout performed and anesthesia consent Position: Trendelenburg Lidocaine 1% used for infiltration and patient sedated Hand hygiene performed , maximum sterile barriers used  and Seldinger technique used Catheter size: 8.5 Fr Central line was placed.MAC introducer Procedure performed using ultrasound guided technique. Ultrasound Notes:anatomy identified, needle tip was noted to be adjacent to the nerve/plexus identified, no ultrasound evidence of intravascular and/or intraneural injection and image(s) printed for medical record Attempts: 1 Following insertion, line sutured, dressing applied and Biopatch. Post procedure assessment: blood return through all ports, no air and free fluid flow  Patient tolerated the procedure well with no immediate complications. Additional procedure comments: Triple lumen SLIC placed via swan port.

## 2021-01-17 NOTE — Anesthesia Procedure Notes (Signed)
Procedure Name: Intubation Date/Time: 01/17/2021 2:51 PM Performed by: Thelma Comp, CRNA Pre-anesthesia Checklist: Patient identified, Emergency Drugs available, Suction available and Patient being monitored Patient Re-evaluated:Patient Re-evaluated prior to induction Oxygen Delivery Method: Circle System Utilized Preoxygenation: Pre-oxygenation with 100% oxygen Induction Type: IV induction Ventilation: Mask ventilation without difficulty Laryngoscope Size: Mac and 3 Grade View: Grade I Tube type: Oral Tube size: 8.0 mm Number of attempts: 1 Airway Equipment and Method: Stylet Placement Confirmation: ETT inserted through vocal cords under direct vision, positive ETCO2 and breath sounds checked- equal and bilateral Secured at: 21 cm Tube secured with: Tape Dental Injury: Teeth and Oropharynx as per pre-operative assessment

## 2021-01-17 NOTE — Transfer of Care (Signed)
Immediate Anesthesia Transfer of Care Note  Patient: [name]  Procedure(s) Performed: CORONARY ARTERY BYPASS GRAFTING (CABG) TIMES 3 , ON PUMP, USING LEFT INTERNAL MAMMARY ARTERY AND ENDOSCOPICALLY HARVESTED RIGHT GREATER SAPHENOUS VEIN (Chest) TRANSESOPHAGEAL ECHOCARDIOGRAM (TEE) APPLICATION OF CELL SAVER ENDOVEIN HARVEST OF GREATER SAPHENOUS VEIN (Right)  Patient Location: ICU  Anesthesia Type:General  Level of Consciousness: sedated and Patient remains intubated per anesthesia plan  Airway & Oxygen Therapy: Patient remains intubated per anesthesia plan and Patient placed on Ventilator (see vital sign flow sheet for setting)  Post-op Assessment: Report given to RN and Post -op Vital signs reviewed and stable  Post vital signs: Reviewed and stable  Last Vitals:  Vitals Value Taken Time  BP    Temp    Pulse    Resp 12 01/17/21 1939  SpO2 99 % 01/17/21 1939  Vitals shown include unvalidated device data.  Last Pain:  Vitals:   01/17/21 1108  TempSrc: Oral  PainSc: 0-No pain      Patients Stated Pain Goal: 0 (46/80/32 1224)  Complications: No notable events documented.

## 2021-01-18 ENCOUNTER — Inpatient Hospital Stay (HOSPITAL_COMMUNITY): Payer: Medicare HMO

## 2021-01-18 ENCOUNTER — Encounter (HOSPITAL_COMMUNITY): Payer: Self-pay | Admitting: Thoracic Surgery (Cardiothoracic Vascular Surgery)

## 2021-01-18 DIAGNOSIS — J9601 Acute respiratory failure with hypoxia: Secondary | ICD-10-CM

## 2021-01-18 LAB — POCT I-STAT 7, (LYTES, BLD GAS, ICA,H+H)
Acid-Base Excess: 1 mmol/L (ref 0.0–2.0)
Acid-base deficit: 10 mmol/L — ABNORMAL HIGH (ref 0.0–2.0)
Acid-base deficit: 10 mmol/L — ABNORMAL HIGH (ref 0.0–2.0)
Acid-base deficit: 7 mmol/L — ABNORMAL HIGH (ref 0.0–2.0)
Acid-base deficit: 9 mmol/L — ABNORMAL HIGH (ref 0.0–2.0)
Acid-base deficit: 9 mmol/L — ABNORMAL HIGH (ref 0.0–2.0)
Bicarbonate: 14.8 mmol/L — ABNORMAL LOW (ref 20.0–28.0)
Bicarbonate: 15.3 mmol/L — ABNORMAL LOW (ref 20.0–28.0)
Bicarbonate: 17.9 mmol/L — ABNORMAL LOW (ref 20.0–28.0)
Bicarbonate: 19.2 mmol/L — ABNORMAL LOW (ref 20.0–28.0)
Bicarbonate: 19.6 mmol/L — ABNORMAL LOW (ref 20.0–28.0)
Bicarbonate: 25.9 mmol/L (ref 20.0–28.0)
Calcium, Ion: 1.01 mmol/L — ABNORMAL LOW (ref 1.15–1.40)
Calcium, Ion: 1.06 mmol/L — ABNORMAL LOW (ref 1.15–1.40)
Calcium, Ion: 1.15 mmol/L (ref 1.15–1.40)
Calcium, Ion: 1.19 mmol/L (ref 1.15–1.40)
Calcium, Ion: 1.21 mmol/L (ref 1.15–1.40)
Calcium, Ion: 1.24 mmol/L (ref 1.15–1.40)
HCT: 23 % — ABNORMAL LOW (ref 36.0–46.0)
HCT: 25 % — ABNORMAL LOW (ref 36.0–46.0)
HCT: 31 % — ABNORMAL LOW (ref 36.0–46.0)
HCT: 33 % — ABNORMAL LOW (ref 36.0–46.0)
HCT: 33 % — ABNORMAL LOW (ref 36.0–46.0)
HCT: 33 % — ABNORMAL LOW (ref 36.0–46.0)
Hemoglobin: 10.5 g/dL — ABNORMAL LOW (ref 12.0–15.0)
Hemoglobin: 11.2 g/dL — ABNORMAL LOW (ref 12.0–15.0)
Hemoglobin: 11.2 g/dL — ABNORMAL LOW (ref 12.0–15.0)
Hemoglobin: 11.2 g/dL — ABNORMAL LOW (ref 12.0–15.0)
Hemoglobin: 7.8 g/dL — ABNORMAL LOW (ref 12.0–15.0)
Hemoglobin: 8.5 g/dL — ABNORMAL LOW (ref 12.0–15.0)
O2 Saturation: 100 %
O2 Saturation: 86 %
O2 Saturation: 92 %
O2 Saturation: 92 %
O2 Saturation: 93 %
O2 Saturation: 97 %
Patient temperature: 36.6
Patient temperature: 36.7
Patient temperature: 36.8
Patient temperature: 97.7
Potassium: 3.2 mmol/L — ABNORMAL LOW (ref 3.5–5.1)
Potassium: 3.6 mmol/L (ref 3.5–5.1)
Potassium: 3.9 mmol/L (ref 3.5–5.1)
Potassium: 3.9 mmol/L (ref 3.5–5.1)
Potassium: 4.2 mmol/L (ref 3.5–5.1)
Potassium: 4.4 mmol/L (ref 3.5–5.1)
Sodium: 141 mmol/L (ref 135–145)
Sodium: 142 mmol/L (ref 135–145)
Sodium: 143 mmol/L (ref 135–145)
Sodium: 143 mmol/L (ref 135–145)
Sodium: 146 mmol/L — ABNORMAL HIGH (ref 135–145)
Sodium: 149 mmol/L — ABNORMAL HIGH (ref 135–145)
TCO2: 16 mmol/L — ABNORMAL LOW (ref 22–32)
TCO2: 16 mmol/L — ABNORMAL LOW (ref 22–32)
TCO2: 19 mmol/L — ABNORMAL LOW (ref 22–32)
TCO2: 21 mmol/L — ABNORMAL LOW (ref 22–32)
TCO2: 21 mmol/L — ABNORMAL LOW (ref 22–32)
TCO2: 27 mmol/L (ref 22–32)
pCO2 arterial: 26 mmHg — ABNORMAL LOW (ref 32.0–48.0)
pCO2 arterial: 28.8 mmHg — ABNORMAL LOW (ref 32.0–48.0)
pCO2 arterial: 39.4 mmHg (ref 32.0–48.0)
pCO2 arterial: 40.9 mmHg (ref 32.0–48.0)
pCO2 arterial: 41.1 mmHg (ref 32.0–48.0)
pCO2 arterial: 48.6 mmHg — ABNORMAL HIGH (ref 32.0–48.0)
pH, Arterial: 7.204 — ABNORMAL LOW (ref 7.350–7.450)
pH, Arterial: 7.248 — ABNORMAL LOW (ref 7.350–7.450)
pH, Arterial: 7.287 — ABNORMAL LOW (ref 7.350–7.450)
pH, Arterial: 7.333 — ABNORMAL LOW (ref 7.350–7.450)
pH, Arterial: 7.361 (ref 7.350–7.450)
pH, Arterial: 7.423 (ref 7.350–7.450)
pO2, Arterial: 166 mmHg — ABNORMAL HIGH (ref 83.0–108.0)
pO2, Arterial: 62 mmHg — ABNORMAL LOW (ref 83.0–108.0)
pO2, Arterial: 64 mmHg — ABNORMAL LOW (ref 83.0–108.0)
pO2, Arterial: 74 mmHg — ABNORMAL LOW (ref 83.0–108.0)
pO2, Arterial: 74 mmHg — ABNORMAL LOW (ref 83.0–108.0)
pO2, Arterial: 93 mmHg (ref 83.0–108.0)

## 2021-01-18 LAB — CBC
HCT: 34.5 % — ABNORMAL LOW (ref 36.0–46.0)
HCT: 29.2 % — ABNORMAL LOW (ref 36.0–46.0)
Hemoglobin: 11.5 g/dL — ABNORMAL LOW (ref 12.0–15.0)
Hemoglobin: 9.7 g/dL — ABNORMAL LOW (ref 12.0–15.0)
MCH: 29.4 pg (ref 26.0–34.0)
MCH: 29.8 pg (ref 26.0–34.0)
MCHC: 33.3 g/dL (ref 30.0–36.0)
MCHC: 33.2 g/dL (ref 30.0–36.0)
MCV: 88.2 fL (ref 80.0–100.0)
MCV: 89.6 fL (ref 80.0–100.0)
Platelets: 111 10*3/uL — ABNORMAL LOW (ref 150–400)
Platelets: 123 10*3/uL — ABNORMAL LOW (ref 150–400)
RBC: 3.91 MIL/uL (ref 3.87–5.11)
RBC: 3.26 MIL/uL — ABNORMAL LOW (ref 3.87–5.11)
RDW: 15.2 % (ref 11.5–15.5)
RDW: 15.9 % — ABNORMAL HIGH (ref 11.5–15.5)
WBC: 12.7 10*3/uL — ABNORMAL HIGH (ref 4.0–10.5)
WBC: 15.9 10*3/uL — ABNORMAL HIGH (ref 4.0–10.5)
nRBC: 0 % (ref 0.0–0.2)
nRBC: 0 % (ref 0.0–0.2)

## 2021-01-18 LAB — GLUCOSE, CAPILLARY
Glucose-Capillary: 105 mg/dL — ABNORMAL HIGH (ref 70–99)
Glucose-Capillary: 114 mg/dL — ABNORMAL HIGH (ref 70–99)
Glucose-Capillary: 114 mg/dL — ABNORMAL HIGH (ref 70–99)
Glucose-Capillary: 116 mg/dL — ABNORMAL HIGH (ref 70–99)
Glucose-Capillary: 121 mg/dL — ABNORMAL HIGH (ref 70–99)
Glucose-Capillary: 123 mg/dL — ABNORMAL HIGH (ref 70–99)
Glucose-Capillary: 144 mg/dL — ABNORMAL HIGH (ref 70–99)
Glucose-Capillary: 218 mg/dL — ABNORMAL HIGH (ref 70–99)
Glucose-Capillary: 230 mg/dL — ABNORMAL HIGH (ref 70–99)
Glucose-Capillary: 262 mg/dL — ABNORMAL HIGH (ref 70–99)
Glucose-Capillary: 278 mg/dL — ABNORMAL HIGH (ref 70–99)
Glucose-Capillary: 78 mg/dL (ref 70–99)
Glucose-Capillary: 85 mg/dL (ref 70–99)
Glucose-Capillary: 87 mg/dL (ref 70–99)
Glucose-Capillary: 89 mg/dL (ref 70–99)

## 2021-01-18 LAB — TYPE AND SCREEN
ABO/RH(D): B POS
Antibody Screen: NEGATIVE
Unit division: 0
Unit division: 0
Unit division: 0
Unit division: 0

## 2021-01-18 LAB — BASIC METABOLIC PANEL
Anion gap: 6 (ref 5–15)
Anion gap: 6 (ref 5–15)
Anion gap: 12 (ref 5–15)
BUN: 10 mg/dL (ref 8–23)
BUN: 11 mg/dL (ref 8–23)
BUN: 13 mg/dL (ref 8–23)
CO2: 20 mmol/L — ABNORMAL LOW (ref 22–32)
CO2: 17 mmol/L — ABNORMAL LOW (ref 22–32)
CO2: 18 mmol/L — ABNORMAL LOW (ref 22–32)
Calcium: 8.7 mg/dL — ABNORMAL LOW (ref 8.9–10.3)
Calcium: 7.6 mg/dL — ABNORMAL LOW (ref 8.9–10.3)
Calcium: 8.7 mg/dL — ABNORMAL LOW (ref 8.9–10.3)
Chloride: 111 mmol/L (ref 98–111)
Chloride: 116 mmol/L — ABNORMAL HIGH (ref 98–111)
Chloride: 109 mmol/L (ref 98–111)
Creatinine, Ser: 1.13 mg/dL — ABNORMAL HIGH (ref 0.44–1.00)
Creatinine, Ser: 1.14 mg/dL — ABNORMAL HIGH (ref 0.44–1.00)
Creatinine, Ser: 1.53 mg/dL — ABNORMAL HIGH (ref 0.44–1.00)
GFR, Estimated: 49 mL/min — ABNORMAL LOW (ref >60)
GFR, Estimated: 48 mL/min — ABNORMAL LOW (ref >60)
GFR, Estimated: 34 mL/min — ABNORMAL LOW (ref >60)
Glucose, Bld: 119 mg/dL — ABNORMAL HIGH (ref 70–99)
Glucose, Bld: 127 mg/dL — ABNORMAL HIGH (ref 70–99)
Glucose, Bld: 222 mg/dL — ABNORMAL HIGH (ref 70–99)
Potassium: 4.8 mmol/L (ref 3.5–5.1)
Potassium: 4.1 mmol/L (ref 3.5–5.1)
Potassium: 4.1 mmol/L (ref 3.5–5.1)
Sodium: 137 mmol/L (ref 135–145)
Sodium: 139 mmol/L (ref 135–145)
Sodium: 139 mmol/L (ref 135–145)

## 2021-01-18 LAB — BLOOD GAS, ARTERIAL
Acid-base deficit: 3.2 mmol/L — ABNORMAL HIGH (ref 0.0–2.0)
Bicarbonate: 21.5 mmol/L (ref 20.0–28.0)
FIO2: 21
O2 Saturation: 91.2 %
Patient temperature: 37
pCO2 arterial: 40.6 mmHg (ref 32.0–48.0)
pH, Arterial: 7.345 — ABNORMAL LOW (ref 7.350–7.450)
pO2, Arterial: 65.7 mmHg — ABNORMAL LOW (ref 83.0–108.0)

## 2021-01-18 LAB — BPAM RBC
Blood Product Expiration Date: 202209012359
Blood Product Expiration Date: 202209022359
Blood Product Expiration Date: 202209142359
Blood Product Expiration Date: 202209142359
ISSUE DATE / TIME: 202208221618
ISSUE DATE / TIME: 202208221618
ISSUE DATE / TIME: 202208230811
Unit Type and Rh: 7300
Unit Type and Rh: 7300
Unit Type and Rh: 7300
Unit Type and Rh: 7300

## 2021-01-18 LAB — MAGNESIUM
Magnesium: 3.7 mg/dL — ABNORMAL HIGH (ref 1.7–2.4)
Magnesium: 3 mg/dL — ABNORMAL HIGH (ref 1.7–2.4)

## 2021-01-18 MED ORDER — ENOXAPARIN SODIUM 40 MG/0.4ML IJ SOSY
40.0000 mg | PREFILLED_SYRINGE | Freq: Every day | INTRAMUSCULAR | Status: DC
  Administered 2021-01-18: 40 mg via SUBCUTANEOUS
  Filled 2021-01-18: qty 0.4

## 2021-01-18 MED ORDER — INSULIN ASPART 100 UNIT/ML IJ SOLN
0.0000 [IU] | INTRAMUSCULAR | Status: DC
  Administered 2021-01-18: 8 [IU] via SUBCUTANEOUS
  Administered 2021-01-18: 12 [IU] via SUBCUTANEOUS
  Administered 2021-01-18: 8 [IU] via SUBCUTANEOUS
  Administered 2021-01-19 – 2021-01-20 (×2): 2 [IU] via SUBCUTANEOUS
  Administered 2021-01-20: 4 [IU] via SUBCUTANEOUS
  Administered 2021-01-20: 2 [IU] via SUBCUTANEOUS
  Administered 2021-01-20: 4 [IU] via SUBCUTANEOUS
  Administered 2021-01-21 (×4): 2 [IU] via SUBCUTANEOUS
  Administered 2021-01-21: 4 [IU] via SUBCUTANEOUS
  Administered 2021-01-21 – 2021-01-22 (×2): 2 [IU] via SUBCUTANEOUS

## 2021-01-18 MED ORDER — AMIODARONE HCL IN DEXTROSE 360-4.14 MG/200ML-% IV SOLN
INTRAVENOUS | Status: AC
  Administered 2021-01-18: 150 mg via INTRAVENOUS
  Filled 2021-01-18: qty 200

## 2021-01-18 MED ORDER — AMIODARONE HCL IN DEXTROSE 360-4.14 MG/200ML-% IV SOLN
60.0000 mg/h | INTRAVENOUS | Status: DC
  Administered 2021-01-18 (×2): 60 mg/h via INTRAVENOUS
  Filled 2021-01-18: qty 200

## 2021-01-18 MED ORDER — HYDRALAZINE HCL 20 MG/ML IJ SOLN
10.0000 mg | Freq: Four times a day (QID) | INTRAMUSCULAR | Status: DC | PRN
  Administered 2021-01-18: 10 mg via INTRAVENOUS
  Filled 2021-01-18: qty 1

## 2021-01-18 MED ORDER — FUROSEMIDE 10 MG/ML IJ SOLN
40.0000 mg | Freq: Once | INTRAMUSCULAR | Status: AC
  Administered 2021-01-18: 40 mg via INTRAVENOUS
  Filled 2021-01-18: qty 4

## 2021-01-18 MED ORDER — CLONIDINE HCL 0.1 MG PO TABS: 0.1000 mg | ORAL_TABLET | Freq: Every day | ORAL | Status: DC

## 2021-01-18 MED ORDER — SODIUM BICARBONATE 8.4 % IV SOLN: 100.0000 meq | Freq: Once | INTRAVENOUS | Status: AC

## 2021-01-18 MED ORDER — QUETIAPINE FUMARATE 25 MG PO TABS
25.0000 mg | ORAL_TABLET | Freq: Every evening | ORAL | Status: DC | PRN
  Administered 2021-01-24: 25 mg via ORAL
  Filled 2021-01-18: qty 1

## 2021-01-18 MED ORDER — SODIUM BICARBONATE 8.4 % IV SOLN
100.0000 meq | Freq: Once | INTRAVENOUS | Status: AC
  Administered 2021-01-18: 100 meq via INTRAVENOUS

## 2021-01-18 MED ORDER — AMIODARONE LOAD VIA INFUSION: 150.0000 mg | Freq: Once | INTRAVENOUS | Status: AC

## 2021-01-18 MED ORDER — IPRATROPIUM-ALBUTEROL 0.5-2.5 (3) MG/3ML IN SOLN
3.0000 mL | Freq: Four times a day (QID) | RESPIRATORY_TRACT | Status: DC | PRN
  Administered 2021-01-18: 3 mL via RESPIRATORY_TRACT
  Filled 2021-01-18: qty 3

## 2021-01-18 MED ORDER — TRAMADOL HCL 50 MG PO TABS
50.0000 mg | ORAL_TABLET | Freq: Four times a day (QID) | ORAL | Status: DC | PRN
  Filled 2021-01-18: qty 1

## 2021-01-18 MED ORDER — SODIUM BICARBONATE 8.4 % IV SOLN
INTRAVENOUS | Status: AC
  Administered 2021-01-18: 100 meq via INTRAVENOUS
  Filled 2021-01-18: qty 100

## 2021-01-18 MED ORDER — AMIODARONE HCL IN DEXTROSE 360-4.14 MG/200ML-% IV SOLN: 30.0000 mg/h | INTRAVENOUS | Status: DC

## 2021-01-18 MED ORDER — QUETIAPINE FUMARATE 25 MG PO TABS
25.0000 mg | ORAL_TABLET | Freq: Once | ORAL | Status: AC
  Administered 2021-01-18: 25 mg via ORAL
  Filled 2021-01-18: qty 1

## 2021-01-18 MED ORDER — ALBUMIN HUMAN 5 % IV SOLN
250.0000 mL | INTRAVENOUS | Status: AC | PRN
  Administered 2021-01-18 (×2): 12.5 g via INTRAVENOUS
  Filled 2021-01-18 (×2): qty 250

## 2021-01-18 MED ORDER — NICARDIPINE HCL IN NACL 20-0.86 MG/200ML-% IV SOLN
3.0000 mg/h | INTRAVENOUS | Status: DC
  Administered 2021-01-18: 3 mg/h via INTRAVENOUS
  Filled 2021-01-18: qty 200

## 2021-01-18 MED ORDER — METOPROLOL TARTRATE 25 MG PO TABS
25.0000 mg | ORAL_TABLET | Freq: Two times a day (BID) | ORAL | Status: DC
  Administered 2021-01-19: 25 mg via ORAL
  Filled 2021-01-18: qty 1

## 2021-01-18 MED FILL — Heparin Sodium (Porcine) Inj 1000 Unit/ML: INTRAMUSCULAR | Qty: 30 | Status: AC

## 2021-01-18 MED FILL — Lidocaine HCl Local Preservative Free (PF) Inj 2%: INTRAMUSCULAR | Qty: 15 | Status: AC

## 2021-01-18 MED FILL — Potassium Chloride Inj 2 mEq/ML: INTRAVENOUS | Qty: 40 | Status: AC

## 2021-01-18 NOTE — Progress Notes (Signed)
EKG CRITICAL VALUE     12 lead EKG performed.  Critical value noted.Briscoe Deutscher, RN notified.   [place], CCT 01/18/2021 7:07 AM

## 2021-01-18 NOTE — Progress Notes (Addendum)
[facility] DAILY ICU PROGRESS NOTE                   [address]            [place] [address]          [phone]   1 Day Post-Op Procedure(s) (LRB): CORONARY ARTERY BYPASS GRAFTING (CABG) TIMES 3 , ON PUMP, USING LEFT INTERNAL MAMMARY ARTERY AND ENDOSCOPICALLY HARVESTED RIGHT GREATER SAPHENOUS VEIN (N/A) TRANSESOPHAGEAL ECHOCARDIOGRAM (TEE) (N/A) APPLICATION OF CELL SAVER ENDOVEIN HARVEST OF GREATER SAPHENOUS VEIN (Right)  Total Length of Stay:  LOS: 8 days   Subjective: Intubated, responds to simple commands  Objective: Vital signs in last 24 hours: Temp:  [97.2 F (36.2 C)-98.6 F (37 C)] 98.2 F (36.8 C) (08/23 0700) Pulse Rate:  [63-64] 63 (08/22 1110) Cardiac Rhythm: Normal sinus rhythm;Sinus bradycardia (08/23 0400) Resp:  [11-28] 12 (08/23 0700) BP: (110-184)/(64-76) 120/64 (08/23 0700) SpO2:  [95 %-100 %] 99 % (08/23 0700) Arterial Line BP: (108-141)/(55-76) 122/62 (08/23 0700) FiO2 (%):  [45 %-50 %] 45 % (08/23 0336) Weight:  [74 kg-80.4 kg] 80.4 kg (08/23 0436)  Filed Weights   01/10/21 2003 01/17/21 1118 01/18/21 0436  Weight: 74 kg 74 kg 80.4 kg    Weight change:    Hemodynamic parameters for last 24 hours: CVP:  [10 mmHg-22 mmHg] 10 mmHg  Intake/Output from previous day: 08/22 0701 - 08/23 0700 In: 5998.5 [I.V.:4202.9; Blood:330; NG/GT:150; IV Piggyback:1315.6] Out: 1390 [Urine:1200; Chest Tube:190]  Intake/Output this shift: No intake/output data recorded.  Current Meds: Scheduled Meds:  acetaminophen  1,000 mg Oral Q6H   Or   acetaminophen (TYLENOL) oral liquid 160 mg/5 mL  1,000 mg Per Tube Q6H   aspirin EC  325 mg Oral Daily   Or   aspirin  324 mg Per Tube Daily   bisacodyl  10 mg Oral Daily   Or   bisacodyl  10 mg Rectal Daily   chlorhexidine gluconate (MEDLINE KIT)  15 mL Mouth Rinse BID   Chlorhexidine Gluconate Cloth  6 each Topical Daily   Chlorhexidine Gluconate Cloth  6 each Topical Daily   mouth rinse  15 mL  Mouth Rinse 10 times per day   metoprolol tartrate  12.5 mg Oral BID   Or   metoprolol tartrate  12.5 mg Per Tube BID   pantoprazole  40 mg Oral Daily   sodium chloride flush  10-40 mL Intracatheter Q12H   sodium chloride flush  3 mL Intravenous Q12H   Continuous Infusions:  sodium chloride 10 mL/hr at 01/18/21 0700   sodium chloride     sodium chloride     albumin human Stopped (01/18/21 0222)   dexmedetomidine (PRECEDEX) IV infusion Stopped (01/18/21 0617)   DOBUTamine     famotidine (PEPCID) IV Stopped (01/17/21 2243)   insulin 1.3 Units/hr (01/18/21 0700)   lactated ringers     lactated ringers     lactated ringers 20 mL/hr at 01/18/21 0700   levofloxacin (LEVAQUIN) IV     nitroGLYCERIN 50 mcg/min (01/18/21 0700)   norepinephrine (LEVOPHED) Adult infusion     PRN Meds:.sodium chloride, albumin human, dextrose, fentaNYL (SUBLIMAZE) injection, lactated ringers, metoprolol tartrate, midazolam, ondansetron (ZOFRAN) IV, sodium chloride flush, sodium chloride flush  General appearance: sedated on vent Neurologic: intact Heart: regular rate and rhythm Lungs: mildly dim in bases Abdomen: benign Extremities: no edema Wound: dressings intact  Lab Results: CBC: Recent Labs    01/17/21 2002 01/17/21 2005  01/18/21 0214  WBC 10.5  --  12.7*  HGB 11.3* 10.9* 11.5*  HCT 34.7* 32.0* 34.5*  PLT 106*  --  111*   BMET:  Recent Labs    01/16/21 0233 01/16/21 1357 01/17/21 1849 01/17/21 1852 01/17/21 2005 01/18/21 0214  NA 135   < > 142   < > 142 137  K 3.7   < > 4.5   < > 4.5 4.8  CL 107   < > 108  --   --  111  CO2 22  --   --   --   --  20*  GLUCOSE 137*   < > 104*  --   --  119*  BUN 11   < > 7*  --   --  10  CREATININE 1.26*   < > 0.80  --   --  1.13*  CALCIUM 8.1*  --   --   --   --  8.7*   < > = values in this interval not displayed.    CMET: Lab Results  Component Value Date   WBC 12.7 (H) 01/18/2021   HGB 11.5 (L) 01/18/2021   HCT 34.5 (L) 01/18/2021    PLT 111 (L) 01/18/2021   GLUCOSE 119 (H) 01/18/2021   CHOL 247 (H) 01/10/2021   TRIG 139 01/10/2021   HDL 39 (L) 01/10/2021   LDLCALC 180 (H) 01/10/2021   ALT 10 10/14/2019   AST 17 10/14/2019   NA 137 01/18/2021   K 4.8 01/18/2021   CL 111 01/18/2021   CREATININE 1.13 (H) 01/18/2021   BUN 10 01/18/2021   CO2 20 (L) 01/18/2021   INR 1.2 01/17/2021   HGBA1C 9.1 (H) 01/17/2021      PT/INR:  Recent Labs    01/17/21 2002  LABPROT 15.5*  INR 1.2   Radiology: DG Abd 1 View  Result Date: 01/17/2021 CLINICAL DATA:  OG tube placement EXAM: ABDOMEN - 1 VIEW COMPARISON:  None. FINDINGS: Enteric tube tip is present in the left upper quadrant consistent with location in the body of the stomach. Proximal side hole projects just below the expected location of the EG junction. IMPRESSION: Enteric tube tip is present in the left upper quadrant consistent with location in the body of the stomach. Electronically Signed   By: Lucienne Capers M.D.   On: 01/17/2021 20:32   DG Chest Port 1 View  Result Date: 01/17/2021 CLINICAL DATA:  Surgery follow-up post coronary artery bypass EXAM: PORTABLE CHEST 1 VIEW COMPARISON:  01/17/2021 FINDINGS: Interval postoperative changes with sternotomy wires and surgical clips and vascular markers in the mediastinum. Interval placement of an endotracheal tube with tip measuring 5.2 cm above the carina. An enteric tube has been placed with tip off the field of view but below the left hemidiaphragm. Catheter is projected over the heart likely represent 2 pericardial drains. Right central venous catheter with tip over the upper SVC region. Borderline heart size. Mild pulmonary vascular congestion is suggested. Atelectasis in the left base. No airspace disease or consolidation. No pleural effusions. No pneumothorax. IMPRESSION: Interval postoperative changes in the mediastinum. Appliances appear in satisfactory position. Electronically Signed   By: Lucienne Capers M.D.    On: 01/17/2021 20:31     Assessment/Plan: S/P Procedure(s) (LRB): CORONARY ARTERY BYPASS GRAFTING (CABG) TIMES 3 , ON PUMP, USING LEFT INTERNAL MAMMARY ARTERY AND ENDOSCOPICALLY HARVESTED RIGHT GREATER SAPHENOUS VEIN (N/A) TRANSESOPHAGEAL ECHOCARDIOGRAM (TEE) (N/A) APPLICATION OF CELL SAVER ENDOVEIN HARVEST OF GREATER SAPHENOUS  VEIN (Right)  1 afebrile, on Nitro for BP control, multiple allergies including CA++ channel blockers so can't use cardene CVP 10, low cardiac output readings (CO/CI 3.0/1.6 on flowtrac)- currently on no pressor/inotropes- would probably benefit from dobutamine. Received 3 of albumin 2 remains intubated- ABG looks good this am- should be able to extubate when more stable hemodynamically 3 EKG some inferior ST elev 4 expected ABLA , stable, minor currently  5 normal renal fxn, UOP a bit low 0.6 ml/kg, weight up about 6 kg, will need some diuretic 6 minor reactive leukocytosis 7 thrombocytopenia- monitor  8 chest tubes - minor drainage, poss d/c after extubation 9 CXR some mild vasc congestion, will need diuresis and pulm toilet/rehab  [name] PA-C Pager [phone] 01/18/2021 7:25 AM   Agree with above Continue volume resuscitation Wean to extubate Will keep drains  Tierrah Anastos O Avyanna Spada

## 2021-01-18 NOTE — Progress Notes (Signed)
Subjective:  Patient denies any chest pain or shortness of breath.  Successful extubated earlier today.  Normal sinus rhythm on the monitor with occasional few beats of nonsustained VT, mostly artifacts on the monitor.  Objective:  Vital Signs in the last 24 hours: Temp:  [97.2 F (36.2 C)-98.2 F (36.8 C)] 97.9 F (36.6 C) (08/23 1200) Resp:  [11-31] 20 (08/23 1200) BP: (110-143)/(64-76) 129/67 (08/23 1200) SpO2:  [95 %-100 %] 95 % (08/23 1200) Arterial Line BP: (108-165)/(55-76) 153/63 (08/23 1200) FiO2 (%):  [40 %-50 %] 40 % (08/23 1042) Weight:  [80.4 kg] 80.4 kg (08/23 0436)  Intake/Output from previous day: 08/22 0701 - 08/23 0700 In: 5998.5 [I.V.:4202.9; Blood:330; NG/GT:150; IV Piggyback:1315.6] Out: 1390 [Urine:1200; Chest Tube:190] Intake/Output from this shift: Total I/O In: 334.2 [I.V.:215.4; IV Piggyback:118.8] Out: 75 [Urine:75]  Physical Exam: Neck: no adenopathy, no carotid bruit, no JVD, and supple, symmetrical, trachea midline Lungs: decreased breath sounds at the bases Heart: regular rate and rhythm, S1, S2 normal, and No pericardial rub.  Surgical dressings dry Abdomen: soft, non-tender; bowel sounds normal; no masses,  no organomegaly Extremities: extremities normal, atraumatic, no cyanosis or edema EKG sinus bradycardia, minimal ST elevation in inferolateral leads as before.  ST-T wave changes in anterolateral leads, improved Lab Results: Recent Labs    01/17/21 2002 01/17/21 2005 01/18/21 0214 01/18/21 1112  WBC 10.5  --  12.7*  --   HGB 11.3*   < > 11.5* 8.5*  PLT 106*  --  111*  --    < > = values in this interval not displayed.   Recent Labs    01/18/21 0214 01/18/21 1112 01/18/21 1115  NA 137 146* 139  K 4.8 3.6 4.1  CL 111  --  116*  CO2 20*  --  17*  GLUCOSE 119*  --  127*  BUN 10  --  11  CREATININE 1.13*  --  1.14*   No results for input(s): TROPONINI in the last 72 hours.  Invalid input(s): CK, MB Hepatic Function Panel No  results for input(s): PROT, ALBUMIN, AST, ALT, ALKPHOS, BILITOT, BILIDIR, IBILI in the last 72 hours. No results for input(s): CHOL in the last 72 hours. No results for input(s): PROTIME in the last 72 hours.  Imaging: DG Chest 2 View  Result Date: 01/17/2021 CLINICAL DATA:  Preoperative evaluation for CABG and TEE EXAM: CHEST - 2 VIEW COMPARISON:  Portable exam 0527 hours compared to 01/16/2021 FINDINGS: Normal heart size, mediastinal contours, and pulmonary vascularity. Atherosclerotic calcification aorta. Chronic interstitial prominence unchanged since at least 05/28/2018. Subsegmental atelectasis versus scarring mid RIGHT lung. No definite acute infiltrate, pleural effusion, or pneumothorax. Bones demineralized. IMPRESSION: Chronic interstitial lung disease with minimal atelectasis versus scarring mid RIGHT lung. Aortic Atherosclerosis (ICD10-I70.0). Electronically Signed   By: Lavonia Dana M.D.   On: 01/17/2021 08:12   DG Abd 1 View  Result Date: 01/17/2021 CLINICAL DATA:  OG tube placement EXAM: ABDOMEN - 1 VIEW COMPARISON:  None. FINDINGS: Enteric tube tip is present in the left upper quadrant consistent with location in the body of the stomach. Proximal side hole projects just below the expected location of the EG junction. IMPRESSION: Enteric tube tip is present in the left upper quadrant consistent with location in the body of the stomach. Electronically Signed   By: Lucienne Capers M.D.   On: 01/17/2021 20:32   DG Chest Port 1 View  Result Date: 01/18/2021 CLINICAL DATA:  Status post cardiac surgery. EXAM: PORTABLE  CHEST 1 VIEW COMPARISON:  January 17, 2021. FINDINGS: Stable cardiomediastinal silhouette. Endotracheal and nasogastric tubes are unchanged in position. Right internal jugular catheter is unchanged. Stable pericardial drain. No pneumothorax is noted. Increased bibasilar atelectasis is noted with small bilateral pleural effusions. Bony thorax is unremarkable. IMPRESSION: Stable  support apparatus. Increased bibasilar atelectasis is noted with small pleural effusions. Electronically Signed   By: Marijo Conception M.D.   On: 01/18/2021 08:39   DG Chest Port 1 View  Result Date: 01/17/2021 CLINICAL DATA:  Surgery follow-up post coronary artery bypass EXAM: PORTABLE CHEST 1 VIEW COMPARISON:  01/17/2021 FINDINGS: Interval postoperative changes with sternotomy wires and surgical clips and vascular markers in the mediastinum. Interval placement of an endotracheal tube with tip measuring 5.2 cm above the carina. An enteric tube has been placed with tip off the field of view but below the left hemidiaphragm. Catheter is projected over the heart likely represent 2 pericardial drains. Right central venous catheter with tip over the upper SVC region. Borderline heart size. Mild pulmonary vascular congestion is suggested. Atelectasis in the left base. No airspace disease or consolidation. No pleural effusions. No pneumothorax. IMPRESSION: Interval postoperative changes in the mediastinum. Appliances appear in satisfactory position. Electronically Signed   By: Lucienne Capers M.D.   On: 01/17/2021 20:31   DG CHEST PORT 1 VIEW  Result Date: 01/16/2021 CLINICAL DATA:  Preop CABG EXAM: PORTABLE CHEST 1 VIEW COMPARISON:  01/09/2021 FINDINGS: Mild elevation of the right hemidiaphragm. Heart is normal size. Peribronchial thickening. No confluent opacities or effusions. No acute bony abnormality. IMPRESSION: Mild elevation of the right hemidiaphragm. Mild bronchitic changes. Electronically Signed   By: Rolm Baptise M.D.   On: 01/16/2021 16:46   VAS US DOPPLER PRE CABG  Result Date: 01/16/2021 PREOPERATIVE VASCULAR EVALUATION Patient Name:  [name]  Date of Exam:   01/16/2021 Medical Rec #: [id number]       Accession #:    [id number] Date of Birth: [birth year]        Patient Gender: F Patient Age:   80 years Exam Location:  [facility] Procedure:      VAS US DOPPLER PRE CABG Referring Phys:  HARRELL LIGHTFOOT --------------------------------------------------------------------------------  Indications:            Pre-CABG. Risk Factors:           Hypertension, hyperlipidemia, Diabetes, current smoker,                         coronary artery disease, prior CVA, PAD. Other Factors:          AAA repair 2007. Hx of TIA, History of renal artery                         stenosis with bilateral renal artery stents placed                         06/25/19. Vascular Interventions: 07/06/2014 -Left femoral to below pop BPG. Comparison Study:       Prior carotid duplex indicating 1-39% bilateral ICA                         stenosis, done 06/19/19. Prior ABI done 06/19/19. Right                         ABI: 0.54 Left  ABI:0.73 Right great toe:0.47 Left great                         toe:0.45 Performing Technologist: Sharion Dove RVS  Examination Guidelines: A complete evaluation includes B-mode imaging, spectral Doppler, color Doppler, and power Doppler as needed of all accessible portions of each vessel. Bilateral testing is considered an integral part of a complete examination. Limited examinations for reoccurring indications may be performed as noted.  Right Carotid Findings: +----------+--------+--------+--------+----------------------+--------+           PSV cm/sEDV cm/sStenosisDescribe              Comments +----------+--------+--------+--------+----------------------+--------+ CCA Prox  199     34              homogeneous                    +----------+--------+--------+--------+----------------------+--------+ CCA Mid   211     34              heterogenous                   +----------+--------+--------+--------+----------------------+--------+ CCA Distal147     29              heterogenous                   +----------+--------+--------+--------+----------------------+--------+ ICA Prox  188     24      1-39%   irregular and calcific          +----------+--------+--------+--------+----------------------+--------+ ICA Distal55      17                                             +----------+--------+--------+--------+----------------------+--------+ ECA       153     27                                             +----------+--------+--------+--------+----------------------+--------+ +----------+--------+-------+--------+------------+           PSV cm/sEDV cmsDescribeArm Pressure +----------+--------+-------+--------+------------+ Subclavian107                                 +----------+--------+-------+--------+------------+ +---------+--------+--+--------+--+ VertebralPSV cm/s59EDV cm/s16 +---------+--------+--+--------+--+ Left Carotid Findings: +----------+--------+--------+--------+----------------------------+--------+           PSV cm/sEDV cm/sStenosisDescribe                    Comments +----------+--------+--------+--------+----------------------------+--------+ CCA Prox  88      17              heterogenous and homogeneous         +----------+--------+--------+--------+----------------------------+--------+ CCA Mid   189     25              homogeneous                          +----------+--------+--------+--------+----------------------------+--------+ CCA Distal134     25              homogeneous                          +----------+--------+--------+--------+----------------------------+--------+  ICA Prox  71      18      1-39%   heterogenous                         +----------+--------+--------+--------+----------------------------+--------+ ICA Distal75      24                                                   +----------+--------+--------+--------+----------------------------+--------+ ECA       119     4                                                    +----------+--------+--------+--------+----------------------------+--------+  +----------+--------+--------+--------+------------+ SubclavianPSV cm/sEDV cm/sDescribeArm Pressure +----------+--------+--------+--------+------------+           136                                  +----------+--------+--------+--------+------------+ +---------+--------+--+--------+--+ VertebralPSV cm/s68EDV cm/s17 +---------+--------+--+--------+--+  ABI Findings: +---------+------------------+-----+-------------------+--------+ Right    Rt Pressure (mmHg)IndexWaveform           Comment  +---------+------------------+-----+-------------------+--------+ Brachial 148                    biphasic                    +---------+------------------+-----+-------------------+--------+ PTA      72                0.46 dampened monophasic         +---------+------------------+-----+-------------------+--------+ DP       70                0.45 dampened monophasic         +---------+------------------+-----+-------------------+--------+ Great Toe16                0.10                             +---------+------------------+-----+-------------------+--------+ +---------+------------------+-----+----------+-------+ Left     Lt Pressure (mmHg)IndexWaveform  Comment +---------+------------------+-----+----------+-------+ Brachial 155                    biphasic          +---------+------------------+-----+----------+-------+ PTA      90                0.58 monophasic        +---------+------------------+-----+----------+-------+ DP       55                0.35 monophasic        +---------+------------------+-----+----------+-------+ Great Toe50                0.32                   +---------+------------------+-----+----------+-------+ +-------+---------------+----------------+ ABI/TBIToday's ABI/TBIPrevious ABI/TBI +-------+---------------+----------------+ Right  0.46           0.54             +-------+---------------+----------------+  Left   0.58  0.73             +-------+---------------+----------------+ Right ABIs appear decreased compared to prior study on 06/19/19. Left ABIs appear decreased compared to prior study on 06/19/19. Bilateral TBIs appear decreased since study done 06/19/19 Right Doppler Findings: +--------+--------+-----+--------+--------+ Site    PressureIndexDoppler Comments +--------+--------+-----+--------+--------+ [id number]          biphasic         +--------+--------+-----+--------+--------+ Radial               biphasic         +--------+--------+-----+--------+--------+ Ulnar                biphasic         +--------+--------+-----+--------+--------+  Left Doppler Findings: +--------+--------+-----+--------+--------+ Site    PressureIndexDoppler Comments +--------+--------+-----+--------+--------+ [id number]          biphasic         +--------+--------+-----+--------+--------+ Radial               biphasic         +--------+--------+-----+--------+--------+ Ulnar                biphasic         +--------+--------+-----+--------+--------+  Summary: Right Carotid: Velocities in the right ICA are consistent with a 1-39% stenosis. Left Carotid: Velocities in the left ICA are consistent with a 1-39% stenosis. Right ABI: Resting right ankle-brachial index indicates severe right lower extremity arterial disease. The right toe-brachial index is abnormal. Left ABI: Resting left ankle-brachial index indicates moderate left lower extremity arterial disease. The left toe-brachial index is abnormal. Right Upper Extremity: Doppler waveform obliterate with right radial compression. Doppler waveforms remain within normal limits with right ulnar compression. Left Upper Extremity: Doppler waveforms decrease 50% with left radial compression. Doppler waveforms remain within normal limits with left ulnar compression.  Electronically signed by Deitra Mayo MD on 01/16/2021 at  7:21:29 PM.    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  status post CABG 3, postop day one, doing well History of inferolateral STEMI in November 2015.  Status post PCI to RCA in November 2015 Hypertension. Hyperlipidemia. Diabetes mellitus. History of CVA. Peripheral vascular disease, history of renal stenting and femoral popliteal bypass in the past. 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 Chronic kidney disease, stage IIIB, Tobacco abuse. Postop neck. Plan Incentive spirometry. Continue present management as per CVTS  LOS: 8 days    [name] 01/18/2021, 12:47 PM

## 2021-01-18 NOTE — Discharge Summary (Addendum)
[address]       [place] [address]             [phone]    Physician Discharge Summary  Patient ID: [name] MRN: [id number] DOB/AGE: Oct 05, 1940 79 y.o.  Admit date: 01/09/2021 Discharge date: 01/27/2021  Admission Diagnoses:  Patient Active Problem List   Diagnosis Date Noted   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   NSTEMI (non-ST elevated myocardial infarction) ([place]) 01/10/2021   CAD (coronary artery disease) 01/10/2021   CVA (cerebral vascular accident) ([place]) 10/11/2019   Hypertensive urgency 04/07/2019   CKD (chronic kidney disease) stage 3, GFR 30-59 ml/min ([place]) 04/07/2019   Chest pain 04/28/2017   History of ST elevation myocardial infarction (STEMI) 04/28/2017   Grief reaction 04/28/2017   Osteoarthritis of left hip 05/18/2015   Status post total replacement of left hip 05/18/2015   Coronary artery disease involving native coronary artery of native heart with angina pectoris ([place]) 04/16/2015   History of stroke 04/16/2015   Type 2 diabetes mellitus with circulatory disorder ([place]) 04/16/2015   Cerebrovascular 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 artery ([place]) 04/16/2015   HLD (hyperlipidemia)    Acute CVA (cerebrovascular accident) ([place]) 02/19/2015   Transient ischemic attack (TIA) 02/18/2015   Stroke ([place])    Cerebral infarction due to thrombosis of right middle cerebral artery (HCC)    Acute myocardial infarction of inferolateral wall ([place]) 04/12/2014   Numbness 03/18/2014   TIA (transient ischemic attack) 03/18/2014   DM (diabetes mellitus) type II uncontrolled, periph vascular disorder ([place]) 03/18/2014   Hyperlipidemia    Hypertension    PVD (peripheral vascular disease) ([place]) 03/05/2014   Transient cerebral ischemia 08/01/2012   Atherosclerosis of native arteries of extremity with intermittent claudication ([place]) 10/19/2011   Unspecified disorders of arteries and arterioles 10/19/2011   PAD (peripheral artery disease)  ([place]) 04/13/2011     Discharge Diagnoses:  Patient Active Problem List   Diagnosis Date Noted   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   NSTEMI (non-ST elevated myocardial infarction) ([place]) 01/10/2021   CAD (coronary artery disease) 01/10/2021   CVA (cerebral vascular accident) ([place]) 10/11/2019   Hypertensive urgency 04/07/2019   CKD (chronic kidney disease) stage 3, GFR 30-59 ml/min ([place]) 04/07/2019   Chest pain 04/28/2017   History of ST elevation myocardial infarction (STEMI) 04/28/2017   Grief reaction 04/28/2017   Osteoarthritis of left hip 05/18/2015   Status post total replacement of left hip 05/18/2015   Coronary artery disease involving native coronary artery of native heart with angina pectoris ([place]) 04/16/2015   History of stroke 04/16/2015   Type 2 diabetes mellitus with circulatory disorder ([place]) 04/16/2015   Cerebrovascular 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 artery ([place]) 04/16/2015   HLD (hyperlipidemia)    Acute CVA (cerebrovascular accident) ([place]) 02/19/2015   Transient ischemic attack (TIA) 02/18/2015   Stroke [facility])    Cerebral infarction due to thrombosis of right middle cerebral artery (HCC)    Acute myocardial infarction of inferolateral wall ([place]) 04/12/2014   Numbness 03/18/2014   TIA (transient ischemic attack) 03/18/2014   DM (diabetes mellitus) type II uncontrolled, periph vascular disorder ([place]) 03/18/2014   Hyperlipidemia    Hypertension    PVD (peripheral vascular disease) ([place]) 03/05/2014   Transient cerebral ischemia 08/01/2012   Atherosclerosis of native arteries of extremity with intermittent claudication ([place]) 10/19/2011   Unspecified disorders of arteries and arterioles 10/19/2011   PAD (peripheral  artery disease) ([place]) 04/13/2011   History of Present Illness:  at time of consultation     [name] is an 80 year old female patient with a past medical history significant for an abdominal aortic aneurysm repair in 2012, diabetes  mellitus type 2, hypertension, hyperlipidemia, acute CVA  in 2016, known coronary artery disease with a stent placed in 2015 in the RCA, femoral-popliteal bypass graft done in 2016, GERD, arthritis, current pack a day cigarette smoker, and chronic kidney disease stage III who presented to the emergency department with chest pain.  On 01/09/2021 she developed chest pain while walking around her house.  It was central in location and radiated to her back.  She states that it did improve with rest after some time had passed.  She denies fever, cough, nausea, vomiting, and diarrhea.  She denies any shortness of breath.  She called an ambulance when the chest pain occurred and they gave her 324 mg of aspirin but she did not wish to go to the hospital at that time.  She then called later because her chest pain was more severe and this time she did get transported to the hospital.  Initial EKG was relatively unremarkable however repeat EKG showed new ST depressions in the inferior leads.  Peak troponin was 6571.  Dr. Terrence Dupont was consulted and it was decided that she would require cardiac catheterization.   Cardiac catheterization was performed  which showed 85% stenosis of the proximal RCA after her stent, ostial left main lesion of 80% stenosis, ostial LAD lesion of 90% stenosis, proximal LAD lesion of 80% stenosis, mid to distal LAD lesion of 70% stenosis, proximal circumflex lesion of 90% stenosis, and third marginal lesion of 70% stenosis.  She also had an estimated left ventricular ejection fraction of 45 to 50% on echocardiogram.  All valves were normal in structure.  We have been consulted for possible surgical revascularization.   I spoke with the patient's daughter on the phone today.  She states that her mother does live alone in an apartment and is able to do some cooking and gets around okay.  She does have some difficulty with longer walks and they have discussed the need for an assisted living facility.   The patient feels like she needs some additional help and does not want a burden her daughter who already does a lot for her.  They have requested a case management consult for a list of assisted living facilities.  Dr. Kipp Brood evaluated the patient and all relevant studies at the time of the initial consultation and it was noted that she refused to consider CABG.  It was also noted at that time that she would need very high risk for complex PCI.  Se however developed recurrent symptoms including at rest and subsequently through discussion with her family she did decide that she would proceed with surgical revascularization.  Hospital Course:  Patient was continuously monitored and medically stabilized and felt suitable to proceed with CABG.  On 01/17/2021 she was taken the operating room where she underwent CABG x3, LIMA to LAD, reverse saphenous vein graft to distal right coronary artery, reverse saphenous vein graft to OM1.  She tolerated procedure well and was taken to the surgical intensive care unit in stable condition.  Postoperative hospital course:  The patient awoke from surgery neurologically intact.  She remained intubated overnight but was able to proceed with extubation on the morning of postoperative day #1 without difficulty.  He has remained hemodynamically stable  but has required volume replacement early on.  Chest tubes were removed on postop day 1.  She does have expected acute blood loss anemia which is being monitored clinically.  She did require intraoperative transfusion.  She received 2 units of packed red blood cells.  She does show some expected postoperative volume overload and will require a diuresis.  Renal function is noted to be within normal limit on postop day 1.  She did develop some acute renal insufficiency with creatinine rising daily.  On postop day 3 creatinine was 2.03.  We have asked critical care medicine to assist with overall postoperative management.  It is  noted that she also has a some neurological changes with some numbness in her right hand develop.  A neurology consultation has been obtained and she has been watched conservatively.  Her symptoms have resolved and she is neurologically back to her baseline.  She had some early postoperative delirium/confusion that has resolved.  She is noted to have previous CVA with left upper extremity weakness which has remained unchanged.  He has speech and language pathology consult has been obtained for swallowing study as nursing is noted there has been some dysphagia and choking with attempting to eat her meals.  All drips have been weaned off and blood pressure is under reasonable control with Norvasc and metoprolol.  She does have occasional sinus bradycardia.  She did have an episode of atrial fibrillation and has been chemically cardioverted to sinus rhythm with intravenous amiodarone.  She is not on a beta-blocker.  Her TSH is within normal limits.  Physical therapy and Occupational Therapy consults were obtained.  It was determined that she would require skilled nursing facility at least short-term at the time of discharge.  On postop day #5 the patient was transferred to the 4 E. telemetry unit.  Her creatinine continues to improve.  She continues to have an excellent spontaneous diuresis.  Weights recorded in the chart are not accurate.  Her creatinine level has improved down to 1.32.  She remained hypertensive and was restarted on her home Imdur at a reduced dose.  She is bradycardic and not started on a beta blocker.  She had some shortness of breath and had some difficulty weaning off oxygen.  CXR was obtained and showed continued atelectasis and small pleural effusions.  There is also felt to be a component of vascular congestion as well and she has been started on oral Lasix.  This will be continued at 40 mg p.o. daily for 7 days following discharge.  Follow-up chest x-ray obtained on the day of discharge  showed some early improvement in lower lobe atelectasis and pleural effusion.  She will be seen in follow-up by her cardiologist in 2 weeks and antihypertensive medications will be adjusted as required.  Discharged Condition: stable   Consults: cardiology  Treatments:   Op NOTE  01/17/2021 Patient:  [name] Pre-Op Dx: Coronary artery disease                         Peripheral vascular disease                         Hypertension                         Hyperlipidemia  Diabetes mellitus with a hemoglobin A1c of 9.1   Post-op Dx: Same Procedure: CABG X 3, LIMA LAD, reverse saphenous vein graft to distal right coronary artery, reverse saphenous vein graft to OM1. Endoscopic greater saphenous vein harvest on the right     Surgeon and Role:      * Lightfoot, Lucile Crater, MD - Primary    Evonnie Pat, PA-C- assisting   Anesthesia  general EBL: 500 ml Blood Administration: 2 units of packed red blood cells Xclamp Time:  62 min Pump Time:  81mn   Drains: 147F blake drain: L, mediastinal  Wires: None Counts: correct     Indications: 80year old female admitted following an NSTEMI.  She underwent a left heart cath which showed significant three-vessel coronary artery disease.  Originally she did not want to proceed with surgery but given the lack of percutaneous options she ultimately agreed to proceed with surgical revascularization.   Findings: Heavily calcified aorta.  Calcified LAD.  Intramyocardial target was free of disease.  Calcified OM target.  Calcified distal RCA target.  Good LIMA.  Small vein.   Performed under single cross-clamp due to calcific disease.  RCA vein jumped off the hood of the OM vein graft.  DISPOSITION: [facility]  Discharge Exam: Blood pressure (!) 160/69, pulse 80, temperature 98.1 F (36.7 C), temperature source Oral, resp. rate  16, height _0  (1.702 m), weight 78.1 kg, SpO2 97 %.  General appearance: alert, cooperative, and no distress Heart: regular rate and rhythm Lungs: breath sounds clear, diminished in bases Abdomen: soft, non tender Extremities: no LE edema Wound: the sternotomy incision and left EVH incisions are intact and dry Minimal swelling left hand, motor/sensory/strength remain  grossly intact   Discharge Medications:  The patient has been discharged on:   1.Beta Blocker:  Yes [ x  ]                              No   [   ]                              If No, reason:  2.Ace Inhibitor/ARB: Yes [x   ]                                     No  [    ]                                     If No, reason:  3.Statin:   Yes [ x  ]                  No  [   ]                  If No, reason:  4.Ecasa:  Yes  [ x  ]                  No   [   ]                  If No, reason:  Patient had ACS upon admission:  Plavix/P2Y12  inhibitor: Yes [  x ]                                      No  [   ]     Discharge Instructions     Ambulatory referral to Smoking Cessation Program   Complete by: As directed       Allergies as of 01/27/2021       Reactions   Keflex [cephalexin] Shortness Of Breath, Other (See Comments)   Chest pain, headache   Lipitor [atorvastatin] Shortness Of Breath, Other (See Comments)   Severe pain/cramping in legs- "I cannot walk, talk, or breathe" (pt is currently taking 20 mg daily 10/11/19)   Codeine Other (See Comments)   Disrupted patient's equilibrium- "Made my world flip upside down"   Hydrochlorothiazide Other (See Comments)   Other reaction(s): lightheaded, nausea   Hydrocodone-acetaminophen Other (See Comments)   Other reaction(s): "get crazy"   Metformin Hcl Other (See Comments)   Other reaction(s): upset stomach   Tramadol Hcl Other (See Comments)   Other reaction(s): "get crazy"   Azor [amlodipine-olmesartan] Swelling, Palpitations, Rash   Amlodipine alone  resumed 12/2013, tolerating   Lisinopril Cough   Penicillins Dermatitis, Other (See Comments)   Did it involve swelling of the face/tongue/throat, SOB, or low BP? No, just worsened eczema Did it involve sudden or severe rash/hives, skin peeling, or any reaction on the inside of your mouth or nose? No Did you need to seek medical attention at a hospital or doctor's office? No When did it last happen? "More than 10 years ago" If all above answers are "NO", may proceed with cephalosporin use.        Medication List     STOP taking these medications    atorvastatin 40 MG tablet Commonly known as: LIPITOR   esomeprazole 40 MG capsule Commonly known as: NEXIUM   metoprolol succinate 100 MG 24 hr tablet Commonly known as: TOPROL-XL   nitroGLYCERIN 0.4 MG SL tablet Commonly known as: NITROSTAT   spironolactone 25 MG tablet Commonly known as: ALDACTONE       TAKE these medications    acetaminophen 650 MG CR tablet Commonly known as: TYLENOL Take 650 mg by mouth at bedtime as needed for pain.   albuterol 108 (90 Base) MCG/ACT inhaler Commonly known as: VENTOLIN HFA Inhale 2 puffs into the lungs every 4 (four) hours as needed for wheezing or shortness of breath (or coughing).   amLODipine 5 MG tablet Commonly known as: NORVASC Take 5 mg by mouth daily.   aspirin 81 MG EC tablet Take 1 tablet (81 mg total) by mouth daily. Swallow whole. Start taking on: January 28, 2021 What changed: additional instructions   Bayer Breeze 2 System w/Device Kit 1 each by Does not apply route 2 (two) times daily.   blood glucose meter kit and supplies Kit Dispense based on patient and insurance preference. Use up to four times daily as directed. (FOR ICD-9 250.00, 250.01). What changed:  how much to take how to take this when to take this   busPIRone 5 MG tablet Commonly known as: BUSPAR Take 1 tablet (5 mg total) by mouth 2 (two) times daily.   clopidogrel 75 MG tablet Commonly  known as: PLAVIX Take 1 tablet (75 mg total) by mouth daily. Start taking on: January 28, 2021   furosemide 40 MG tablet Commonly known as: LASIX  Take 1 tablet (40 mg total) by mouth daily for 7 days. Start taking on: January 28, 2021   glimepiride 2 MG tablet Commonly known as: Amaryl Take 1 tablet (2 mg total) by mouth every morning.   isosorbide mononitrate 30 MG 24 hr tablet Commonly known as: IMDUR Take 1 tablet (30 mg total) by mouth daily. Start taking on: January 28, 2021 What changed:  medication strength how much to take   losartan 25 MG tablet Commonly known as: COZAAR Take 1 tablet (25 mg total) by mouth daily. Start taking on: January 28, 2021   pantoprazole 40 MG tablet Commonly known as: PROTONIX Take 1 tablet (40 mg total) by mouth daily. Start taking on: January 28, 2021   potassium chloride SA 20 MEQ tablet Commonly known as: KLOR-CON Take 1 tablet (20 mEq total) by mouth daily for 7 days. Start taking on: January 28, 2021   rosuvastatin 10 MG tablet Commonly known as: CRESTOR Take 1 tablet (10 mg total) by mouth at bedtime.   varenicline 0.5 MG tablet Commonly known as: CHANTIX Take 1 tablet (0.5 mg total) by mouth 2 (two) times daily.   vitamin B-12 250 MCG tablet Commonly known as: CYANOCOBALAMIN Take 1 tablet (250 mcg total) by mouth daily. Start taking on: January 28, 2021        Follow-up Information     [name], MD Follow up.   Specialty: Cardiology Why: Appointment to see cardiologist on February 02, 2021 at 9:45 AM. Contact information: [address] [place] [address] [phone]         Lajuana Matte, MD Follow up on 02/04/2021.   Specialty: Cardiothoracic Surgery Why: Your virtual appointment is at 3:30 PM.  Do not go to the office.  Dr. Kipp Brood will contact you. Contact information: [place] [address] [phone]                  Signed: Antony Odea, PA-C  01/27/2021, 11:01 AM

## 2021-01-18 NOTE — Addendum Note (Signed)
Addendum  created 01/18/21 1322 by Audry Pili, MD   Clinical Note Signed, Intraprocedure Blocks edited

## 2021-01-18 NOTE — [place] Notes (Signed)
      [address]       [place] [address]             [phone]     POD # 1 CABG x 3  CTSp for tachycardia  Currently on BIPAP, went into narrow complex tachy- atrial fibrillation Maintaining adequate BP  Will load with amiodarone bolus then drip  On BiPAP- will check ABG  BP 133/73   Pulse 72   Temp 98.1 F (36.7 C)   Resp 18   Ht 5\' 7"  (1.702 m)   Wt 80.4 kg   SpO2 97%   BMI 27.76 kg/m   Intake/Output Summary (Last 24 hours) at 01/18/2021 1856 Last data filed at 01/18/2021 1700 Gross per 24 hour  Intake 4976.38 ml  Output 1015 ml  Net 3961.38 ml   K= 3.9, creatinine 1.53 up from 1.14 HCt 33  Will need to monitor closely as respiratory status is marginal  Laura Lipps C. Roxan Hockey, MD [facility] [phone]

## 2021-01-18 NOTE — Consult Note (Addendum)
NAME:  Braeley Buskey, MRN:  [id number], DOB:  [birth year], LOS: 8 ADMISSION DATE:  01/09/2021, CONSULTATION DATE:  01/18/21 REFERRING MD: Dr. Kipp Brood,  CHIEF COMPLAINT: Hypertension, altered mental status  History of Present Illness:  Jordana Dugue is an 80 year old female with a past medical history significant for AAA, type 2 diabetes, CKD stage III, hypertension, hyperlipidemia, STEMI in 2018 with stent placement who presented to the emergency department on 8/15 with a complaint of chest pain or shortness of breath.  Found to have an NSTEMI with multivessel disease on cardiac cath, CT surgery consulted for CABG. she underwent CABG x3 with LIMA to the LAD reverse saphenous graft to the distal RCA and OM1.   She was extubated postop on 8/23.  And had an episode of hypertension along with altered mental status and pulling off nasal cannula oxygen with sats dropping, in this context PCCM was consulted.  Pertinent  Medical History   has a past medical history of AAA (abdominal aortic aneurysm) ([place]), Aneurysm ([place]), Arthritis, Constipation, Diabetes mellitus, GERD (gastroesophageal reflux disease), Glaucoma, Hyperlipidemia, Hypertension, Kidney stones, Myocardial infarction [facility]), Peripheral vascular disease ([place]), Pneumonia, Stroke ([place]) (01/2015), TIA (transient ischemic attack), and Urinary incontinence.   Significant Hospital Events: Including procedures, antibiotic start and stop dates in addition to other pertinent events   8/16 Admit with NSTEMI 8/22 Intubated for CABG 8/23 extubated, PCCM consult for HTN and encephalopathy  Interim History / Subjective:  SBP initially 160's on max nitro gtt, prn Metoprolol was not given earlier secondary to bradycardia.  Pt now tachy so beta blocker given with good response        FIO2 21.00 O2 Saturation 91.2 %  pH, Arterial 7.345 Low  Patient temperature 37.0  pCO2 arterial 40.6 mmHg Collection site A-LINE  pO2, Arterial 65.7 Low  mmHg Drawn by  COLLECTED BY RT  Bicarbonate 21.5 mmol/L Sample type ARTERIAL DRAW  Acid-base deficit 3.2 High  mmol/L Allens test (pass/fail) PASS         Objective   Blood pressure 129/67, pulse 63, temperature 98.1 F (36.7 C), resp. rate (!) 25, height '5\' 7"'  (1.702 m), weight 80.4 kg, SpO2 95 %. CVP:  [9 mmHg-22 mmHg] 19 mmHg  Vent Mode: PSV;CPAP FiO2 (%):  [40 %-50 %] 40 % Set Rate:  [12 bmp] 12 bmp Vt Set:  [490 mL] 490 mL PEEP:  [5 cmH20] 5 cmH20 Pressure Support:  [10 cmH20] 10 cmH20 Plateau Pressure:  [16 cmH20-22 cmH20] 22 cmH20   Intake/Output Summary (Last 24 hours) at 01/18/2021 1422 Last data filed at 01/18/2021 1200 Gross per 24 hour  Intake 6332.69 ml  Output 1465 ml  Net 4867.69 ml   Filed Weights   01/10/21 2003 01/17/21 1118 01/18/21 0436  Weight: 74 kg 74 kg 80.4 kg    General:  eldlerly F, awake, alert, slightly tachypneic  HEENT: MM pink/moist pupils equal Neuro: awake, answering questions, 4/5 strength R upper and lower extremities, reduced on the L (baseline per pt and chart review), oriented to person and place, denies numbness, date slightly incorrect CV: s1s2 rrr, no m/r/g PULM:  on nasal cannula oxygen, tachypneic, but no retractions, able to speak in full sentences, no rhonchi or wheezing GI: soft, bsx4 active  Extremities: warm/dry, no edema  Skin: no rashes or lesions   Resolved Hospital Problem list     Assessment & Plan:    Acute Hypoxic Respiratory Failure Pt was pulling of her nasal cannula and desaturated, at the time of assessment  was calmer and leaving O2 in place with PO2 in 60's on ABG P: -currently stable, leaving her O2 on right now, at risk for re-intubation but doing ok right now -continue nasal cannula O2 to maintain >92% -CXR with pleural effusions, may need diuresis tomorrow   Acute Encephalopathy No new focal deficits Has previous CVA and residual L-sided weakness P: -Suspect this is metabolic and related to dissipating sedation  along with possible delirium -no hypercarbia -No current indication for stat head CT   HTN, CAD, NSTEMI with recent CABG -post-opmanagement per primary team, BP improved with metoprolol, though SBP dropped somewhat precipitously and rebounded -start Cardene gtt (swelling noted as allergy, suspect this may have been lower extremity, monitor)   AKI -monitor renal indices and UOP      Best Practice (right click and "Reselect all SmartList Selections" daily)   Per primary  Labs   CBC: Recent Labs  Lab 01/15/21 0325 01/16/21 0233 01/16/21 1357 01/17/21 0318 01/17/21 1507 01/17/21 1720 01/17/21 1723 01/17/21 2002 01/17/21 2005 01/18/21 0214 01/18/21 1112 01/18/21 1249  WBC 7.9 7.2  --  7.5  --   --   --  10.5  --  12.7*  --   --   HGB 10.9* 9.8*   < > 10.6*   < > 8.1*   < > 11.3* 10.9* 11.5* 8.5* 7.8*  HCT 33.4* 29.9*   < > 31.8*   < > 24.4*   < > 34.7* 32.0* 34.5* 25.0* 23.0*  MCV 90.0 89.3  --  89.6  --   --   --  89.2  --  88.2  --   --   PLT 166 170  --  183  --  105*  --  106*  --  111*  --   --    < > = values in this interval not displayed.    Basic Metabolic Panel: Recent Labs  Lab 01/14/21 0126 01/15/21 0325 01/16/21 0233 01/16/21 1357 01/17/21 1702 01/17/21 1723 01/17/21 1741 01/17/21 1849 01/17/21 1852 01/17/21 2005 01/18/21 0214 01/18/21 1112 01/18/21 1115 01/18/21 1249  NA 137 137 135   < > 140   < > 140 142   < > 142 137 146* 139 149*  K 3.9 4.0 3.7   < > 4.9   < > 5.1 4.5   < > 4.5 4.8 3.6 4.1 3.2*  CL 107 110 107   < > 104  --  106 108  --   --  111  --  116*  --   CO2 22 20* 22  --   --   --   --   --   --   --  20*  --  17*  --   GLUCOSE 144* 151* 137*   < > 110*  --  98 104*  --   --  119*  --  127*  --   BUN '18 15 11   ' < > 7*  --  7* 7*  --   --  10  --  11  --   CREATININE 1.60* 1.29* 1.26*   < > 0.90  --  0.90 0.80  --   --  1.13*  --  1.14*  --   CALCIUM 8.5* 8.3* 8.1*  --   --   --   --   --   --   --  8.7*  --  7.6*  --   MG  --    --   --   --   --   --   --   --   --   --  3.7*  --   --   --    < > = values in this interval not displayed.   GFR: Estimated Creatinine Clearance: 42.2 mL/min (A) (by C-G formula based on SCr of 1.14 mg/dL (H)). Recent Labs  Lab 01/16/21 0233 01/17/21 0318 01/17/21 2002 01/18/21 0214  WBC 7.2 7.5 10.5 12.7*    Liver Function Tests: No results for input(s): AST, ALT, ALKPHOS, BILITOT, PROT, ALBUMIN in the last 168 hours. No results for input(s): LIPASE, AMYLASE in the last 168 hours. No results for input(s): AMMONIA in the last 168 hours.  ABG    Component Value Date/Time   PHART 7.345 (L) 01/18/2021 1300   PCO2ART 40.6 01/18/2021 1300   PO2ART 65.7 (L) 01/18/2021 1300   HCO3 21.5 01/18/2021 1300   TCO2 16 (L) 01/18/2021 1249   ACIDBASEDEF 3.2 (H) 01/18/2021 1300   O2SAT 91.2 01/18/2021 1300     Coagulation Profile: Recent Labs  Lab 01/16/21 1333 01/17/21 2002  INR 1.0 1.2    Cardiac Enzymes: No results for input(s): CKTOTAL, CKMB, CKMBINDEX, TROPONINI in the last 168 hours.  HbA1C: Hgb A1c MFr Bld  Date/Time Value Ref Range Status  01/17/2021 03:18 AM 9.1 (H) 4.8 - 5.6 % Final    Comment:    REPEATED TO VERIFY (NOTE) Pre diabetes:          5.7%-6.4%  Diabetes:              >6.4%  Glycemic control for   <7.0% adults with diabetes   01/10/2021 04:47 AM 9.7 (H) 4.8 - 5.6 % Final    Comment:    (NOTE) Pre diabetes:          5.7%-6.4%  Diabetes:              >6.4%  Glycemic control for   <7.0% adults with diabetes     CBG: Recent Labs  Lab 01/18/21 0605 01/18/21 0659 01/18/21 0855 01/18/21 0941 01/18/21 1039  GLUCAP 123* 116* 78 89 85    Review of Systems:   Denies Chest pain, shortness of breath, numbness or weakness, negative except as noted in HPI  Past Medical History:  She,  has a past medical history of AAA (abdominal aortic aneurysm) ([place]), Aneurysm ([place]), Arthritis, Constipation, Diabetes mellitus, GERD (gastroesophageal reflux  disease), Glaucoma, Hyperlipidemia, Hypertension, Kidney stones, Myocardial infarction [facility]), Peripheral vascular disease ([place]), Pneumonia, Stroke ([place]) (01/2015), TIA (transient ischemic attack), and Urinary incontinence.   Surgical History:   Past Surgical History:  Procedure Laterality Date   ABDOMINAL AORTAGRAM N/A 03/13/2014   Procedure: ABDOMINAL AORTAGRAM;  Surgeon: Elam Dutch, MD;  Location: [facility] CATH LAB;  Service: Cardiovascular;  Laterality: N/A;   ABDOMINAL AORTIC ANEURYSM REPAIR     ABDOMINAL AORTIC ANEURYSM REPAIR  ?2012   ABDOMINAL HYSTERECTOMY     partial   BACK SURGERY     CARDIAC CATHETERIZATION     CORONARY ANGIOPLASTY  03/2015   CORONARY ARTERY BYPASS GRAFT N/A 01/17/2021   Procedure: CORONARY ARTERY BYPASS GRAFTING (CABG) TIMES 3 , ON PUMP, USING LEFT INTERNAL MAMMARY ARTERY AND ENDOSCOPICALLY HARVESTED RIGHT GREATER SAPHENOUS VEIN;  Surgeon: Lajuana Matte, MD;  Location: [place];  Service: Open Heart Surgery;  Laterality: N/A;   ENDOVEIN HARVEST OF GREATER SAPHENOUS VEIN Right 01/17/2021   Procedure: ENDOVEIN HARVEST OF GREATER SAPHENOUS VEIN;  Surgeon: Lajuana Matte, MD;  Location: [place];  Service: Open Heart Surgery;  Laterality: Right;   EYE SURGERY Right  laser surgery for blood behind eye, loss of sight   FEMORAL-POPLITEAL BYPASS GRAFT Left 07/06/2014   Procedure: BYPASS GRAFT FEMORAL-POPLITEAL ARTERY;  Surgeon: Elam Dutch, MD;  [facility] OR;  Service: Vascular;  Laterality: Left;   HERNIA REPAIR     JOINT REPLACEMENT     LEFT HEART CATH AND CORONARY ANGIOGRAPHY N/A 01/11/2021   Procedure: LEFT HEART CATH AND CORONARY ANGIOGRAPHY;  Surgeon: [name], MD;  Location: [place] CV LAB;  Service: Cardiovascular;  Laterality: N/A;   LEFT HEART CATHETERIZATION WITH CORONARY ANGIOGRAM N/A 04/12/2014   Procedure: LEFT HEART CATHETERIZATION WITH CORONARY ANGIOGRAM;  Surgeon: Clent Demark, MD;  Location: [place] CATH LAB;  Service:  Cardiovascular;  Laterality: N/A;   PERCUTANEOUS CORONARY STENT INTERVENTION (PCI-S)  04/12/2014   Procedure: PERCUTANEOUS CORONARY STENT INTERVENTION (PCI-S);  Surgeon: Clent Demark, MD;  Location: [facility] C[facility] LAB;  Service: Cardiovascular;;  prox and mid RCA   PERIPHERAL VASCULAR INTERVENTION  06/25/2019   Procedure: PERIPHERAL VASCULAR INTERVENTION;  Surgeon: Elam Dutch, MD;  Location: [place] CV LAB;  Service: Cardiovascular;;  Lt Renal and Rt Renal   RENAL ANGIOGRAPHY Bilateral 06/25/2019   Procedure: RENAL ANGIOGRAPHY;  Surgeon: Elam Dutch, MD;  Location: [place] CV LAB;  Service: Cardiovascular;  Laterality: Bilateral;   TEE WITHOUT CARDIOVERSION N/A 01/17/2021   Procedure: TRANSESOPHAGEAL ECHOCARDIOGRAM (TEE);  Surgeon: Lajuana Matte, MD;  Location: [place];  Service: Open Heart Surgery;  Laterality: N/A;   TOTAL HIP ARTHROPLASTY Left 05/18/2015   TOTAL HIP ARTHROPLASTY Left 05/18/2015   Procedure: LEFT TOTAL HIP ARTHROPLASTY ANTERIOR APPROACH;  Surgeon: Mcarthur Rossetti, MD;  Location: [place];  Service: Orthopedics;  Laterality: Left;   TOTAL KNEE ARTHROPLASTY Left ~ 2003   TUBAL LIGATION     uterine tumor     VENTRAL HERNIA REPAIR       Social History:   reports that she has been smoking cigarettes. She has a 20.00 pack-year smoking history. She has never used smokeless tobacco. She reports that she does not drink alcohol and does not use drugs.   Family History:  Her family history includes Aneurysm in her sister; Diabetes in her mother; Hyperlipidemia in her brother and sister; Hypertension in her brother, father, and sister; Stroke in her father and mother.   Allergies Allergies  Allergen Reactions   Keflex [Cephalexin] Shortness Of Breath and Other (See Comments)    Chest pain, headache   Lipitor [Atorvastatin] Shortness Of Breath and Other (See Comments)    Severe pain/cramping in legs- "I cannot walk, talk, or breathe" (pt is currently taking  20 mg daily 10/11/19)   Codeine Other (See Comments)    Disrupted patient's equilibrium- "Made my world flip upside down"   Hydrochlorothiazide Other (See Comments)    Other reaction(s): lightheaded, nausea   Hydrocodone-Acetaminophen Other (See Comments)    Other reaction(s): "get crazy"   Metformin Hcl Other (See Comments)    Other reaction(s): upset stomach   Tramadol Hcl Other (See Comments)    Other reaction(s): "get crazy"   Azor [Amlodipine-Olmesartan] Swelling, Palpitations and Rash    Amlodipine alone resumed 12/2013, tolerating   Lisinopril Cough   Penicillins Dermatitis and Other (See Comments)    Did it involve swelling of the face/tongue/throat, SOB, or low BP? No, just worsened eczema Did it involve sudden or severe rash/hives, skin peeling, or any reaction on the inside of your mouth or nose? No Did you need to seek medical attention  at a hospital or doctor's office? No When did it last happen? "More than 10 years ago" If all above answers are "NO", may proceed with cephalosporin use.      Home Medications  Prior to Admission medications   Medication Sig Start Date End Date Taking? Authorizing Provider  acetaminophen (TYLENOL) 650 MG CR tablet Take 650 mg by mouth at bedtime as needed for pain.    Yes [provider]  albuterol (PROVENTIL HFA;VENTOLIN HFA) 108 (90 Base) MCG/ACT inhaler Inhale 2 puffs into the lungs every 4 (four) hours as needed for wheezing or shortness of breath (or coughing). 9/52/84  Yes Delora Fuel, MD  amLODipine (NORVASC) 5 MG tablet Take 5 mg by mouth daily. 09/01/19  Yes [provider]  aspirin EC 81 MG tablet Take 1 tablet (81 mg total) by mouth daily. Patient taking differently: Take 243 mg by mouth daily as needed (chest pain). 06/25/19  Yes Fields, Jessy Oto, MD  atorvastatin (LIPITOR) 40 MG tablet Take 1 tablet (40 mg total) by mouth daily. 10/15/19 01/10/21 Yes Georgette Shell, MD  blood glucose meter kit and supplies  KIT Dispense based on patient and insurance preference. Use up to four times daily as directed. (FOR ICD-9 250.00, 250.01). Patient taking differently: Inject 1 each into the skin See admin instructions. Dispense based on patient and insurance preference. Use up to four times daily as directed. (FOR ICD-9 250.00, 250.01). 10/15/19  Yes Georgette Shell, MD  Blood Glucose Monitoring Suppl (BAYER BREEZE 2 SYSTEM) w/Device KIT 1 each by Does not apply route 2 (two) times daily. 10/15/19  Yes Georgette Shell, MD  clopidogrel (PLAVIX) 75 MG tablet Take 75 mg by mouth daily.   Yes [provider]  esomeprazole (NEXIUM) 40 MG capsule Take 40 mg by mouth daily as needed (acid reflux).    Yes [provider]  glimepiride (AMARYL) 2 MG tablet Take 1 tablet (2 mg total) by mouth every morning. 10/15/19 01/10/21 Yes Georgette Shell, MD  isosorbide mononitrate (IMDUR) 60 MG 24 hr tablet Take 60 mg by mouth daily. 02/24/15  Yes [provider]  metoprolol succinate (TOPROL-XL) 100 MG 24 hr tablet Take 100 mg by mouth daily. 06/09/19  Yes [provider]  nitroGLYCERIN (NITROSTAT) 0.4 MG SL tablet Place 0.4 mg under the tongue every 5 (five) minutes as needed for chest pain.   Yes [provider]  busPIRone (BUSPAR) 5 MG tablet Take 1 tablet (5 mg total) by mouth 2 (two) times daily. Patient not taking: No sig reported 10/15/19   Georgette Shell, MD  spironolactone (ALDACTONE) 25 MG tablet Take 25 mg by mouth daily. Patient not taking: No sig reported 08/04/19   [provider]     Critical care time:  35 minutes    CRITICAL CARE Performed by: Otilio Carpen Shamira Toutant   Total critical care time: 35 minutes  Critical care time was exclusive of separately billable procedures and treating other patients.  Critical care was necessary to treat or prevent imminent or life-threatening deterioration.  Critical care was time spent personally by me on the  following activities: development of treatment plan with patient and/or surrogate as well as nursing, discussions with consultants, evaluation of patient's response to treatment, examination of patient, obtaining history from patient or surrogate, ordering and performing treatments and interventions, ordering and review of laboratory studies, ordering and review of radiographic studies, pulse oximetry and re-evaluation of patient's condition.   Otilio Carpen Kianni Lheureux,  PA-C [facility] See Amion for pager If no response to pager , please call [phone] until 7pm After 7:00 pm call Elink  [phone]

## 2021-01-18 NOTE — Progress Notes (Signed)
eLink Physician-Brief Progress Note Patient Name: [name] DOB: December 12, 1940 MRN: [id number]   Date of Service  01/18/2021  HPI/Events of Note  Patient currently on an Amiodarone gtt,  heart rate 67, Metoprolol 25 mg dose is due.  eICU Interventions  Hold Metoprolol dose scheduled for tonight.        Frederik Pear 01/18/2021, 10:58 PM

## 2021-01-18 NOTE — Hospital Course (Addendum)
History of Present Illness:  at time of consultation     [name] is an 80 year old female patient with a past medical history significant for an abdominal aortic aneurysm repair in 2012, diabetes mellitus type 2, hypertension, hyperlipidemia, acute CVA  in 2016, known coronary artery disease with a stent placed in 2015 in the RCA, femoral-popliteal bypass graft done in 2016, GERD, arthritis, current pack a day cigarette smoker, and chronic kidney disease stage III who presented to the emergency department with chest pain.  On 01/09/2021 she developed chest pain while walking around her house.  It was central in location and radiated to her back.  She states that it did improve with rest after some time had passed.  She denies fever, cough, nausea, vomiting, and diarrhea.  She denies any shortness of breath.  She called an ambulance when the chest pain occurred and they gave her 324 mg of aspirin but she did not wish to go to the hospital at that time.  She then called later because her chest pain was more severe and this time she did get transported to the hospital.  Initial EKG was relatively unremarkable however repeat EKG showed new ST depressions in the inferior leads.  Peak troponin was 6571.  Dr. Terrence [name] was consulted and it was decided that she would require cardiac catheterization.   Cardiac catheterization was performed  which showed 85% stenosis of the proximal RCA after her stent, ostial left main lesion of 80% stenosis, ostial LAD lesion of 90% stenosis, proximal LAD lesion of 80% stenosis, mid to distal LAD lesion of 70% stenosis, proximal circumflex lesion of 90% stenosis, and third marginal lesion of 70% stenosis.  She also had an estimated left ventricular ejection fraction of 45 to 50% on echocardiogram.  All valves were normal in structure.  We have been consulted for possible surgical revascularization.   I spoke with the patient's daughter on the phone today.  She states that her  mother does live alone in an apartment and is able to do some cooking and gets around okay.  She does have some difficulty with longer walks and they have discussed the need for an assisted living facility.  The patient feels like she needs some additional help and does not want a burden her daughter who already does a lot for her.  They have requested a case management consult for a list of assisted living facilities.  Dr. Kipp [name] evaluated the patient and all relevant studies at the time of the initial consultation and it was noted that she refused to consider CABG.  It was also noted at that time that she would need very high risk for complex PCI.  Se however developed recurrent symptoms including at rest and subsequently through discussion with her family she did decide that she would proceed with surgical revascularization.  Hospital course:  Patient was continuously monitored and medically stabilized and felt suitable to proceed with CABG.  On 01/17/2021 she was taken the operating room where she underwent CABG x3, LIMA to LAD, reverse saphenous vein graft to distal right coronary artery, reverse saphenous vein graft to OM1.  She tolerated procedure well and was taken to the surgical intensive care unit in stable condition.  Postoperative hospital course:  The patient awoke from surgery neurologically intact.  She remained intubated overnight but was able to proceed with extubation on the morning of postoperative day #1 without difficulty.  He has remained hemodynamically stable but has required volume replacement early on.  Chest tubes were removed on postop day 1.  She does have expected acute blood loss anemia which is being monitored clinically.  She did require intraoperative transfusion.  She received 2 units of packed red blood cells.  She does show some expected postoperative volume overload and will require a diuresis.  Renal function is noted to be within normal limit on postop day 1.  She  did develop some acute renal insufficiency with creatinine rising daily.  On postop day 3 creatinine was 2.03.  We have asked critical care medicine to assist with overall postoperative management.  It is noted that she also has a some neurological changes with some numbness in her right hand develop.  A neurology consultation has been obtained and she has been watched conservatively.  Her symptoms have resolved and she is neurologically back to her baseline.  She had some early postoperative delirium/confusion that has resolved.  She is noted to have previous CVA with left upper extremity weakness which has remained unchanged.  He has speech and language pathology consult has been obtained for swallowing study as nursing is noted there has been some dysphagia and choking with attempting to eat her meals.  All drips have been weaned off and blood pressure is under reasonable control with Norvasc and metoprolol.  She does have occasional sinus bradycardia.  She did have an episode of atrial fibrillation and has been chemically cardioverted to sinus rhythm with intravenous amiodarone.  She is not on a beta-blocker.  Her TSH is within normal limits.  Physical therapy and Occupational Therapy consults have been obtained.  She will most likely require skilled nursing facility at least short-term at the time of discharge.  On postop day #5 the patient was transferred to the 4 E. telemetry unit.  Her creatinine continues to improve.  She continues to have an excellent spontaneous diuresis.  Weights recorded in the chart are not accurate.  Her creatinine level has improved down to 1.32.  She remained hypertensive and was restarted on her home Imdur at a reduced dose.  She is bradycardic and not started on a beta blocker.  She had some shortness of breath and had some difficulty weaning off oxygen.  CXR was obtained and showed continued atelectasis and small pleural effusions.  There is also felt to be a component of  vascular congestion and we have started on some Lasix.

## 2021-01-18 NOTE — Procedures (Signed)
Extubation Procedure Note  Patient Details:   Name: [name] DOB: 03-Nov-1940 MRN: [id number]   Airway Documentation:    Vent end date: 01/18/21 Vent end time: 1130   Evaluation  O2 sats: stable throughout Complications: No apparent complications Patient did tolerate procedure well. Bilateral Breath Sounds: Clear, Diminished   Yes Placed on 4 L [place] Incentive spirometer performed 364ml   [name] 01/18/2021, 11:38 AM

## 2021-01-19 ENCOUNTER — Inpatient Hospital Stay (HOSPITAL_COMMUNITY): Payer: Medicare HMO

## 2021-01-19 ENCOUNTER — Other Ambulatory Visit (HOSPITAL_COMMUNITY): Payer: Self-pay

## 2021-01-19 DIAGNOSIS — Z951 Presence of aortocoronary bypass graft: Secondary | ICD-10-CM

## 2021-01-19 DIAGNOSIS — E782 Mixed hyperlipidemia: Secondary | ICD-10-CM

## 2021-01-19 LAB — GLUCOSE, CAPILLARY
Glucose-Capillary: 149 mg/dL — ABNORMAL HIGH (ref 70–99)
Glucose-Capillary: 144 mg/dL — ABNORMAL HIGH (ref 70–99)
Glucose-Capillary: 103 mg/dL — ABNORMAL HIGH (ref 70–99)
Glucose-Capillary: 87 mg/dL (ref 70–99)
Glucose-Capillary: 97 mg/dL (ref 70–99)
Glucose-Capillary: 99 mg/dL (ref 70–99)

## 2021-01-19 LAB — CBC
HCT: 31.8 % — ABNORMAL LOW (ref 36.0–46.0)
Hemoglobin: 10.8 g/dL — ABNORMAL LOW (ref 12.0–15.0)
MCH: 29.9 pg (ref 26.0–34.0)
MCHC: 34 g/dL (ref 30.0–36.0)
MCV: 88.1 fL (ref 80.0–100.0)
Platelets: 135 10*3/uL — ABNORMAL LOW (ref 150–400)
RBC: 3.61 MIL/uL — ABNORMAL LOW (ref 3.87–5.11)
RDW: 15.6 % — ABNORMAL HIGH (ref 11.5–15.5)
WBC: 12.8 10*3/uL — ABNORMAL HIGH (ref 4.0–10.5)
nRBC: 0 % (ref 0.0–0.2)

## 2021-01-19 LAB — POCT I-STAT 7, (LYTES, BLD GAS, ICA,H+H)
Acid-base deficit: 1 mmol/L (ref 0.0–2.0)
Bicarbonate: 23.6 mmol/L (ref 20.0–28.0)
Calcium, Ion: 1.15 mmol/L (ref 1.15–1.40)
HCT: 29 % — ABNORMAL LOW (ref 36.0–46.0)
Hemoglobin: 9.9 g/dL — ABNORMAL LOW (ref 12.0–15.0)
O2 Saturation: 98 %
Patient temperature: 38.2
Potassium: 4 mmol/L (ref 3.5–5.1)
Sodium: 143 mmol/L (ref 135–145)
TCO2: 25 mmol/L (ref 22–32)
pCO2 arterial: 40.3 mmHg (ref 32.0–48.0)
pH, Arterial: 7.38 (ref 7.350–7.450)
pO2, Arterial: 117 mmHg — ABNORMAL HIGH (ref 83.0–108.0)

## 2021-01-19 LAB — BASIC METABOLIC PANEL
Anion gap: 9 (ref 5–15)
BUN: 16 mg/dL (ref 8–23)
CO2: 24 mmol/L (ref 22–32)
Calcium: 8.6 mg/dL — ABNORMAL LOW (ref 8.9–10.3)
Chloride: 108 mmol/L (ref 98–111)
Creatinine, Ser: 1.88 mg/dL — ABNORMAL HIGH (ref 0.44–1.00)
GFR, Estimated: 27 mL/min — ABNORMAL LOW (ref >60)
Glucose, Bld: 152 mg/dL — ABNORMAL HIGH (ref 70–99)
Potassium: 4.2 mmol/L (ref 3.5–5.1)
Sodium: 141 mmol/L (ref 135–145)

## 2021-01-19 MED ORDER — LIDOCAINE 5 % EX PTCH
1.0000 | MEDICATED_PATCH | Freq: Every day | CUTANEOUS | Status: AC
  Filled 2021-01-19: qty 1

## 2021-01-19 MED ORDER — ENOXAPARIN SODIUM 30 MG/0.3ML IJ SOSY
30.0000 mg | PREFILLED_SYRINGE | Freq: Every day | INTRAMUSCULAR | Status: DC
  Administered 2021-01-19: 30 mg via SUBCUTANEOUS
  Filled 2021-01-19: qty 0.3

## 2021-01-19 MED ORDER — ASPIRIN EC 81 MG PO TBEC
81.0000 mg | DELAYED_RELEASE_TABLET | Freq: Every day | ORAL | Status: DC
  Administered 2021-01-20 – 2021-01-27 (×8): 81 mg via ORAL
  Filled 2021-01-19 (×8): qty 1

## 2021-01-19 MED ORDER — METOPROLOL TARTRATE 50 MG PO TABS
50.0000 mg | ORAL_TABLET | Freq: Two times a day (BID) | ORAL | Status: DC
  Administered 2021-01-19 – 2021-01-21 (×4): 50 mg via ORAL
  Filled 2021-01-19 (×4): qty 1

## 2021-01-19 MED ORDER — AMLODIPINE BESYLATE 5 MG PO TABS
5.0000 mg | ORAL_TABLET | Freq: Every day | ORAL | Status: DC
  Administered 2021-01-19 – 2021-01-20 (×2): 5 mg via ORAL
  Filled 2021-01-19 (×2): qty 1

## 2021-01-19 MED ORDER — CLOPIDOGREL BISULFATE 75 MG PO TABS
75.0000 mg | ORAL_TABLET | Freq: Every day | ORAL | Status: DC
  Administered 2021-01-19 – 2021-01-27 (×9): 75 mg via ORAL
  Filled 2021-01-19 (×9): qty 1

## 2021-01-19 MED ORDER — ORAL CARE MOUTH RINSE
15.0000 mL | Freq: Two times a day (BID) | OROMUCOSAL | Status: DC
  Administered 2021-01-19 – 2021-01-27 (×14): 15 mL via OROMUCOSAL

## 2021-01-19 NOTE — TOC Benefit Eligibility Note (Signed)
Patient Teacher, English as a foreign language completed.    The patient is currently admitted and upon discharge could be taking Entresto 24-26 mg.  The current 30 day co-pay is, $9.85.   The patient is currently admitted and upon discharge could be taking Farxiga 10 mg.  The current 30 day co-pay is, $9.85.   The patient is currently admitted and upon discharge could be taking Jardiance 10 mg.  The current 30 day co-pay is, $9.85.   The patient is insured through [place], [place] Patient Advocate Specialist [place] Team Direct Number: [phone]  Fax: [phone]

## 2021-01-19 NOTE — Progress Notes (Signed)
[facility] Progress Note Patient Name: [name] DOB: 01-Jan-1941 MRN: [id number]   Date of Service  01/19/2021  HPI/Events of Note  ABG has resulted and needs to be reviewed.  eICU Interventions  ABG reviewed, I agree with HFNC and interval repeat ABG.        [name] 01/19/2021, 12:19 AM

## 2021-01-19 NOTE — Progress Notes (Signed)
Patient ID: [name], female   DOB: 01/06/1941, 80 y.o.   MRN: [id number] T[facility] Evening Rounds:  Hemodynamically stable in sinus rhythm on IV amio  Sats 92% on 4L [place]  Urine output ok. 20/hr.  Evaluated by PT today.  Awake and alert. Says her right hand is almost back to normal.

## 2021-01-19 NOTE — Progress Notes (Signed)
Subjective:  Events of last night and noted.  No further episodes of A. Fib with RVR and paroxysmal SVT, now on IV amiodarone back in sinus rhythm.  Patient denies any chest pain or shortness of breath.  States overall feels well.  Objective:  Vital Signs in the last 24 hours: Temp:  [97.5 F (36.4 C)-98.7 F (37.1 C)] 98.7 F (37.1 C) (08/24 1057) Pulse Rate:  [68-78] 68 (08/23 2311) Resp:  [12-30] 20 (08/24 0800) BP: (88-169)/(50-84) 127/60 (08/24 0500) SpO2:  [89 %-100 %] 97 % (08/24 0800) Arterial Line BP: (94-180)/(42-85) 146/56 (08/24 0800) FiO2 (%):  [50 %-70 %] 50 % (08/23 2311) Weight:  [81.7 kg] 81.7 kg (08/24 0500)  Intake/Output from previous day: 08/23 0701 - 08/24 0700 In: 1061.8 [I.V.:797.3; IV Piggyback:264.5] Out: 1055 [Urine:1055] Intake/Output from this shift: Total I/O In: 66.7 [I.V.:66.7] Out: -   Physical Exam: Neck: no adenopathy, no carotid bruit, no JVD, and supple, symmetrical, trachea midline Lungs: decreased breath sounds at bases Heart: regular rate and rhythm, S1, S2 normal, and 2/6 systolic murmur noted Abdomen: soft, non-tender; bowel sounds normal; no masses,  no organomegaly Extremities: extremities normal, atraumatic, no cyanosis or edema  Lab Results: Recent Labs    01/18/21 1540 01/18/21 1731 01/19/21 0131 01/19/21 0322  WBC 15.9*  --   --  12.8*  HGB 9.7*   < > 9.9* 10.8*  PLT 123*  --   --  135*   < > = values in this interval not displayed.   Recent Labs    01/18/21 1540 01/18/21 1731 01/19/21 0131 01/19/21 0322  NA 139   < > 143 141  K 4.1   < > 4.0 4.2  CL 109  --   --  108  CO2 18*  --   --  24  GLUCOSE 222*  --   --  152*  BUN 13  --   --  16  CREATININE 1.53*  --   --  1.88*   < > = values in this interval not displayed.   No results for input(s): TROPONINI in the last 72 hours.  Invalid input(s): CK, MB Hepatic Function Panel No results for input(s): PROT, ALBUMIN, AST, ALT, ALKPHOS, BILITOT, BILIDIR, IBILI  in the last 72 hours. No results for input(s): CHOL in the last 72 hours. No results for input(s): PROTIME in the last 72 hours.  Imaging: DG Abd 1 View  Result Date: 01/17/2021 CLINICAL DATA:  OG tube placement EXAM: ABDOMEN - 1 VIEW COMPARISON:  None. FINDINGS: Enteric tube tip is present in the left upper quadrant consistent with location in the body of the stomach. Proximal side hole projects just below the expected location of the EG junction. IMPRESSION: Enteric tube tip is present in the left upper quadrant consistent with location in the body of the stomach. Electronically Signed   By: Lucienne Capers M.D.   On: 01/17/2021 20:32   DG Chest Port 1 View  Result Date: 01/19/2021 CLINICAL DATA:  Status post cardiac surgery. EXAM: PORTABLE CHEST 1 VIEW COMPARISON:  January 18, 2021. FINDINGS: Stable cardiomegaly. Right venous sheath is unchanged. No pneumothorax is noted. Bibasilar opacities are noted concerning for atelectasis or edema with associated pleural effusions. Bony thorax is unremarkable. IMPRESSION: Grossly stable bibasilar opacities concerning for atelectasis or edema with associated pleural effusions. Electronically Signed   By: Marijo Conception M.D.   On: 01/19/2021 08:45   DG CHEST PORT 1 VIEW  Result Date: 01/18/2021  CLINICAL DATA:  Respiratory failure, acute EXAM: PORTABLE CHEST 1 VIEW COMPARISON:  01/18/2021 FINDINGS: Layering bilateral effusions. Cardiomegaly, vascular congestion. Bilateral lower lobe airspace opacities, right greater than left. Interval extubation and removal of NG tube. Changes of CABG. Right central line is unchanged. IMPRESSION: Bilateral layering effusions and bibasilar atelectasis or infiltrates. Mild vascular congestion. Electronically Signed   By: Rolm Baptise M.D.   On: 01/18/2021 18:06   DG Chest Port 1 View  Result Date: 01/18/2021 CLINICAL DATA:  Status post cardiac surgery. EXAM: PORTABLE CHEST 1 VIEW COMPARISON:  January 17, 2021. FINDINGS:  Stable cardiomediastinal silhouette. Endotracheal and nasogastric tubes are unchanged in position. Right internal jugular catheter is unchanged. Stable pericardial drain. No pneumothorax is noted. Increased bibasilar atelectasis is noted with small bilateral pleural effusions. Bony thorax is unremarkable. IMPRESSION: Stable support apparatus. Increased bibasilar atelectasis is noted with small pleural effusions. Electronically Signed   By: Marijo Conception M.D.   On: 01/18/2021 08:39   DG Chest Port 1 View  Result Date: 01/17/2021 CLINICAL DATA:  Surgery follow-up post coronary artery bypass EXAM: PORTABLE CHEST 1 VIEW COMPARISON:  01/17/2021 FINDINGS: Interval postoperative changes with sternotomy wires and surgical clips and vascular markers in the mediastinum. Interval placement of an endotracheal tube with tip measuring 5.2 cm above the carina. An enteric tube has been placed with tip off the field of view but below the left hemidiaphragm. Catheter is projected over the heart likely represent 2 pericardial drains. Right central venous catheter with tip over the upper SVC region. Borderline heart size. Mild pulmonary vascular congestion is suggested. Atelectasis in the left base. No airspace disease or consolidation. No pleural effusions. No pneumothorax. IMPRESSION: Interval postoperative changes in the mediastinum. Appliances appear in satisfactory position. Electronically Signed   By: Lucienne Capers M.D.   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  status post CABG 3, postop day 2, doing well Status post paroxysmal A. 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  Status post PCI to RCA in November 2015 Hypertension. Hyperlipidemia. Diabetes mellitus. History of CVA. Peripheral vascular disease, history of renal stenting and femoral popliteal bypass  in the past. 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 Acute on Chronic kidney disease, stage IIIB, Tobacco abuse. Postop atelectasis Plan Encouraged incentive spirometry. Out of bed to chair as tolerated. Agree with holding Lasix Monitor renal function.   LOS: 9 days    [name] 01/19/2021, 1:32 PM

## 2021-01-19 NOTE — Evaluation (Signed)
Physical Therapy Evaluation Patient Details Name: [name] MRN: [id number] DOB: 1940/07/19 Today's Date: 01/19/2021   History of Present Illness  Pt is an 80 y/o female admitted 8/14 with chest pain and SOB. s/p L hear cath with diagnosis of multivessel CAD, pt s/p CABG x3 8/22.  PMHx: AAA, DM, Glaucoma, HTN, MI, PVD, Stroke, daily smoker.  Clinical Impression  Pt admitted with/for CP s/p CABG.  Pt needing mod/max assist for basic mobility and gait has not been assessed.  Pt currently limited functionally due to the problems listed. ( See problems list.)   Pt will benefit from PT to maximize function and safety in order to get ready for next venue listed below.     Follow Up Recommendations SNF;Supervision/Assistance - 24 hour    Equipment Recommendations  3in1 (PT)    Recommendations for Other Services       Precautions / Restrictions Precautions Precautions: Fall;Sternal      Mobility  Bed Mobility Overal bed mobility: Needs Assistance Bed Mobility: Rolling;Sidelying to Sit;Sit to Supine Rolling: Mod assist Sidelying to sit: Mod assist   Sit to supine: Mod assist   General bed mobility comments: cues for technique, truncal/LE assist overall    Transfers Overall transfer level: Needs assistance   Transfers: Sit to/from Stand Sit to Stand: Max assist         General transfer comment: face to face assist to stand for pad placement.  Ambulation/Gait             General Gait Details: NT  Stairs            Wheelchair Mobility    Modified Rankin (Stroke Patients Only)       Balance Overall balance assessment: Needs assistance Sitting-balance support: Single extremity supported;No upper extremity supported;Feet supported Sitting balance-Leahy Scale: Fair       Standing balance-Leahy Scale: Poor                               Pertinent Vitals/Pain Pain Assessment: Faces Faces Pain Scale: Hurts even more Pain Location:  sternal Pain Descriptors / Indicators: Aching;Grimacing;Guarding;Sore Pain Intervention(s): Monitored during session;Limited activity within patient's tolerance;Premedicated before session    [place] expects to be discharged to:: Skilled nursing facility Living Arrangements: Alone   Type of Home: Apartment Home Access: Stairs to enter;Other (comment) (curb)     Home Layout: One level Home Equipment: Walker - 4 wheels      Prior Function Level of Independence: Independent with assistive device(s)               Hand Dominance        Extremity/Trunk Assessment   Upper Extremity Assessment Upper Extremity Assessment: Defer to OT evaluation    Lower Extremity Assessment Lower Extremity Assessment: Generalized weakness       Communication   Communication: No difficulties  Cognition Arousal/Alertness: Awake/alert Behavior During Therapy: WFL for tasks assessed/performed Overall Cognitive Status: Within Functional Limits for tasks assessed (close to baseline  "not quite normal" per pt.)                                        General Comments General comments (skin integrity, edema, etc.): Instructed pt and family in sternal precautions in general, reinforced during mobility.  VSS overall  BP in the 100's/70's  Exercises     Assessment/Plan    PT Assessment Patient needs continued PT services  PT Problem List Decreased strength;Decreased activity tolerance;Decreased balance;Decreased mobility;Decreased knowledge of use of DME;Decreased knowledge of precautions;Cardiopulmonary status limiting activity;Pain       PT Treatment Interventions DME instruction;Gait training;Functional mobility training;Therapeutic activities;Balance training;Patient/family education    PT Goals (Current goals can be found in the Care Plan section)  Acute Rehab PT Goals Patient Stated Goal: I think I need some rehab before home, pt agreed PT Goal  Formulation: With patient/family Time For Goal Achievement: 02/02/21 Potential to Achieve Goals: Good    Frequency Min 3X/week   Barriers to discharge        Co-evaluation               AM-PAC PT "6 Clicks" Mobility  Outcome Measure Help needed turning from your back to your side while in a flat bed without using bedrails?: A Lot Help needed moving from lying on your back to sitting on the side of a flat bed without using bedrails?: A Lot Help needed moving to and from a bed to a chair (including a wheelchair)?: A Lot Help needed standing up from a chair using your arms (e.g., wheelchair or bedside chair)?: A Lot Help needed to walk in hospital room?: A Lot Help needed climbing 3-5 steps with a railing? : A Lot 6 Click Score: 12    End of Session   Activity Tolerance: Patient tolerated treatment well;Patient limited by pain Patient left: in bed;with call bell/phone within reach;with family/visitor present Nurse Communication: Mobility status PT Visit Diagnosis: Other abnormalities of gait and mobility (R26.89);Pain;Difficulty in walking, not elsewhere classified (R26.2) Pain - part of body:  (sternal)    Time: 9509-3267 PT Time Calculation (min) (ACUTE ONLY): 37 min   Charges:   PT Evaluation $PT Eval Moderate Complexity: 1 Mod PT Treatments $Therapeutic Activity: 8-22 mins        01/19/2021  Ginger Carne., PT Acute Rehabilitation Services [phone]  (pager) [phone]  (office)  [name] 01/19/2021, 5:55 PM

## 2021-01-19 NOTE — Progress Notes (Signed)
NAME:  [name], MRN:  [id number], DOB:  Apr 22, 1941, LOS: 9 ADMISSION DATE:  01/09/2021, CONSULTATION DATE:  01/19/21 REFERRING MD: Dr. Kipp Brood,  CHIEF COMPLAINT: Hypertension, altered mental status  History of Present Illness:  [name] is an 80 year old female with a past medical history significant for AAA, type 2 diabetes, CKD stage III, hypertension, hyperlipidemia, STEMI in 2018 with stent placement who presented to the emergency department on 8/15 with a complaint of chest pain or shortness of breath.  Found to have an NSTEMI with multivessel disease on cardiac cath, CT surgery consulted for CABG. she underwent CABG x3 with LIMA to the LAD reverse saphenous graft to the distal RCA and OM1.   She was extubated postop on 8/23.  And had an episode of hypertension along with altered mental status and pulling off nasal cannula oxygen with sats dropping, in this context PCCM was consulted.  Pertinent  Medical History   has a past medical history of AAA (abdominal aortic aneurysm) ([place]), Aneurysm ([place]), Arthritis, Constipation, Diabetes mellitus, GERD (gastroesophageal reflux disease), Glaucoma, Hyperlipidemia, Hypertension, Kidney stones, Myocardial infarction [facility]), Peripheral vascular disease ([place]), Pneumonia, Stroke ([place]) (01/2015), TIA (transient ischemic attack), and Urinary incontinence.   Significant Hospital Events: Including procedures, antibiotic start and stop dates in addition to other pertinent events   8/16 Admit with NSTEMI 8/22 Intubated for CABG 8/23 extubated, PCCM consult for HTN and encephalopathy  Interim History / Subjective:  Comfortable.  No complaints.  Objective   Blood pressure 127/60, pulse 68, temperature 98.2 F (36.8 C), temperature source Oral, resp. rate (!) 23, height 5\' 7"  (1.702 m), weight 81.7 kg, SpO2 97 %. CVP:  [6 mmHg-34 mmHg] 12 mmHg  Vent Mode: PSV;CPAP FiO2 (%):  [40 %-70 %] 50 % PEEP:  [5 cmH20] 5 cmH20 Pressure Support:  [10  cmH20] 10 cmH20   Intake/Output Summary (Last 24 hours) at 01/19/2021 0804 Last data filed at 01/19/2021 0600 Gross per 24 hour  Intake 998.27 ml  Output 1055 ml  Net -56.73 ml    Filed Weights   01/17/21 1118 01/18/21 0436 01/19/21 0500  Weight: 74 kg 80.4 kg 81.7 kg   General: Adult female, sitting up in bed, in NAD. Neuro: Awake, nods head appropriately, MAE's. HEENT: [place]/AT. Sclerae anicteric. EOMI. Cardiovascular: RRR, no M/R/G.  Lungs: Respirations even and unlabored.  CTA bilaterally, No W/R/R.  Abdomen: BS x 4, soft, NT/ND.  Musculoskeletal: No gross deformities, no edema.  Skin: Intact, warm, no rashes.  Resolved Hospital Problem list     Assessment & Plan:   Acute Hypoxic Respiratory Failure - improving gradually. Currently stable, leaving her O2 on right now, at risk for re-intubation but doing ok right now. Small pleural effusions. - Continue HHFNC to maintain SpO2 >92%. - Hold further diuresis for today given bump in SCr.  Acute Encephalopathy - stable, improving gradually. Suspect this is metabolic and related to dissipating sedation along with possible delirium - Continue supportive care. - Avoid sedating meds.  HTN, CAD, NSTEMI with recent CABG x 3. A.fib - resolved after amio, now in NSR. - Continue Metoprolol PRN, Hydralazine PRN. - Maintain K > 4, Mg > 2.  AKI. - Hold further diuresis for today. - Monitor renal indices and UOP.  Rest per primary team.    Montey Hora, [place] For pager details, please see AMION or use Epic chat  After 1900, please call [facility] for cross coverage needs 01/19/2021, 8:31 AM

## 2021-01-19 NOTE — Progress Notes (Addendum)
TCTS DAILY ICU PROGRESS NOTE                   [address]            [place] [address]          [phone]   2 Days Post-Op Procedure(s) (LRB): CORONARY ARTERY BYPASS GRAFTING (CABG) TIMES 3 , ON PUMP, USING LEFT INTERNAL MAMMARY ARTERY AND ENDOSCOPICALLY HARVESTED RIGHT GREATER SAPHENOUS VEIN (N/A) TRANSESOPHAGEAL ECHOCARDIOGRAM (TEE) (N/A) APPLICATION OF CELL SAVER ENDOVEIN HARVEST OF GREATER SAPHENOUS VEIN (Right)  Total Length of Stay:  LOS: 9 days   Subjective: Feeling better today  Objective: Vital signs in last 24 hours: Temp:  [97.5 F (36.4 C)-98.2 F (36.8 C)] 98.2 F (36.8 C) (08/24 0427) Pulse Rate:  [68-78] 68 (08/23 2311) Cardiac Rhythm: Normal sinus rhythm (08/24 0400) Resp:  [12-31] 23 (08/24 0700) BP: (88-170)/(50-84) 127/60 (08/24 0500) SpO2:  [89 %-100 %] 97 % (08/24 0700) Arterial Line BP: (94-180)/(42-85) 130/52 (08/24 0700) FiO2 (%):  [40 %-70 %] 50 % (08/23 2311) Weight:  [81.7 kg] 81.7 kg (08/24 0500)  Filed Weights   01/17/21 1118 01/18/21 0436 01/19/21 0500  Weight: 74 kg 80.4 kg 81.7 kg    Weight change: 7.7 kg   Hemodynamic parameters for last 24 hours: CVP:  [6 mmHg-34 mmHg] 12 mmHg  Intake/Output from previous day: 08/23 0701 - 08/24 0700 In: 1061.8 [I.V.:797.3; IV Piggyback:264.5] Out: 1055 [Urine:1055]  Intake/Output this shift: No intake/output data recorded.  Current Meds: Scheduled Meds:  acetaminophen  1,000 mg Oral Q6H   Or   acetaminophen (TYLENOL) oral liquid 160 mg/5 mL  1,000 mg Per Tube Q6H   aspirin EC  325 mg Oral Daily   Or   aspirin  324 mg Per Tube Daily   bisacodyl  10 mg Oral Daily   Or   bisacodyl  10 mg Rectal Daily   Chlorhexidine Gluconate Cloth  6 each Topical Daily   enoxaparin (LOVENOX) injection  40 mg Subcutaneous QHS   insulin aspart  0-24 Units Subcutaneous Q4H   metoprolol tartrate  25 mg Oral BID   pantoprazole  40 mg Oral Daily   Continuous Infusions:  sodium  chloride     amiodarone 30 mg/hr (01/19/21 0600)   DOBUTamine     lactated ringers Stopped (01/18/21 1516)   niCARDipine Stopped (01/18/21 1855)   nitroGLYCERIN Stopped (01/18/21 1710)   norepinephrine (LEVOPHED) Adult infusion Stopped (01/19/21 0555)   PRN Meds:.fentaNYL (SUBLIMAZE) injection, hydrALAZINE, ipratropium-albuterol, metoprolol tartrate, ondansetron (ZOFRAN) IV, QUEtiapine, sodium chloride flush, sodium chloride flush, traMADol  General appearance: alert, cooperative, and no distress Neurologic: some numbness right hand, motor/strength normal- improved c/w yesterday Heart: regular rate and rhythm Lungs: mildly dim in bases Abdomen: benign Extremities: no edema Wound: incis healing well  Lab Results: CBC: Recent Labs    01/18/21 1540 01/18/21 1731 01/19/21 0131 01/19/21 0322  WBC 15.9*  --   --  12.8*  HGB 9.7*   < > 9.9* 10.8*  HCT 29.2*   < > 29.0* 31.8*  PLT 123*  --   --  135*   < > = values in this interval not displayed.   BMET:  Recent Labs    01/18/21 1540 01/18/21 1731 01/19/21 0131 01/19/21 0322  NA 139   < > 143 141  K 4.1   < > 4.0 4.2  CL 109  --   --  108  CO2 18*  --   --  24  GLUCOSE 222*  --   --  152*  BUN 13  --   --  16  CREATININE 1.53*  --   --  1.88*  CALCIUM 8.7*  --   --  8.6*   < > = values in this interval not displayed.    CMET: Lab Results  Component Value Date   WBC 12.8 (H) 01/19/2021   HGB 10.8 (L) 01/19/2021   HCT 31.8 (L) 01/19/2021   PLT 135 (L) 01/19/2021   GLUCOSE 152 (H) 01/19/2021   CHOL 247 (H) 01/10/2021   TRIG 139 01/10/2021   HDL 39 (L) 01/10/2021   LDLCALC 180 (H) 01/10/2021   ALT 10 10/14/2019   AST 17 10/14/2019   NA 141 01/19/2021   K 4.2 01/19/2021   CL 108 01/19/2021   CREATININE 1.88 (H) 01/19/2021   BUN 16 01/19/2021   CO2 24 01/19/2021   INR 1.2 01/17/2021   HGBA1C 9.1 (H) 01/17/2021      PT/INR:  Recent Labs    01/17/21 2002  LABPROT 15.5*  INR 1.2   Radiology: DG CHEST  PORT 1 VIEW  Result Date: 01/18/2021 CLINICAL DATA:  Respiratory failure, acute EXAM: PORTABLE CHEST 1 VIEW COMPARISON:  01/18/2021 FINDINGS: Layering bilateral effusions. Cardiomegaly, vascular congestion. Bilateral lower lobe airspace opacities, right greater than left. Interval extubation and removal of NG tube. Changes of CABG. Right central line is unchanged. IMPRESSION: Bilateral layering effusions and bibasilar atelectasis or infiltrates. Mild vascular congestion. Electronically Signed   By: Rolm Baptise M.D.   On: 01/18/2021 18:06     Assessment/Plan: S/P Procedure(s) (LRB): CORONARY ARTERY BYPASS GRAFTING (CABG) TIMES 3 , ON PUMP, USING LEFT INTERNAL MAMMARY ARTERY AND ENDOSCOPICALLY HARVESTED RIGHT GREATER SAPHENOUS VEIN (N/A) TRANSESOPHAGEAL ECHOCARDIOGRAM (TEE) (N/A) APPLICATION OF CELL SAVER ENDOVEIN HARVEST OF GREATER SAPHENOUS VEIN (Right)  1 afebrile 2 BP quite variable, s BP 80's -170's, on amio gtt  for afib, current sinus, off nitro, off cardene, no pressor/inotropes. CI 2.1, SVR elevated at 1600 , CVP 12, stroke volume slightly low- cont to monitor 3 on HFNC, , duonebs ordered- ABG- ok 4 creat increased o 1.88 today, UOP 0.5 ml/kg /hr, received one dose of lasix- weight up about 6 kg- will probably need more diuresis when able 5 BS adeq conrol 6 H/H stable  7 thrombocytopenia trend improving 8 leukocytosis rend improving 9 PCCM assisting with ICU management 10 CXR smal effus/atx, some vasc congeston  11 push rehab as able- good overall progress   [name] PA-C  pager [phone] 01/19/2021 7:09 AM   Mental status improved. Creatinine up slightly we will hold off on diuresis today. Continue pulmonary hygiene. Remove arterial line  Dastan Krider O Atavia Poppe

## 2021-01-19 NOTE — Consult Note (Addendum)
Neurology Consultation  Reason for Consult: numbness and tingling right hand.  Referring Physician: Dr. Glade Lloyd, MD.   CC: My hand is a lot better.   History is obtained from: patient.   HPI: [name] is a 80 y.o. 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 Patient was admitted on 01/11/21 with NSTEMI. On 01/17/21, she was intubated for CABG. On 01/18/21, she was extubated and PCCM was consulted for encephalopathy and HTN. Her hospital course was complicated by AKI on CKD, AF with RVR, paroxysmal SVT, and acute hypoxic respiratory failure.   On 01/18/21, she complained of right hand numbness in a glove distribution extending up above the wrist. She stated this was new since surgery.   Today, she tells NP that her numbness is gone except for her fingertips. She says the numbness extended to wrist yesterday, but is improved today and only involves the fingertips. States it felt like she had glue on her hand yesterday and that her fingers were webbed. She denies any numbness to LUE or BLEs. There is no pain or numbness/tingling that starts and her neck and radiates down RUE. She states her right hand is a tad clumsy, but able to bring a cup of H20 to face.   FMHx of stroke in mother and father.   Neurology asked to consult for numbness.   ROS: A robust ROS was performed and is negative except as noted in the HPI.   Past Medical History:  Diagnosis Date   AAA (abdominal aortic aneurysm) ([place])    Aneurysm (H[facility])    Arthritis    osteoarthritis   Constipation    Diabetes mellitus    Type 2, on Glimiperide   GERD (gastroesophageal reflux disease)    Glaucoma    Hyperlipidemia    Hypertension    Kidney stones    Myocardial infarction [facility])    Peripheral vascular disease ([place])    Pneumonia     Stroke ([place]) 01/2015   with left side weakness    TIA (transient ischemic attack)    Urinary incontinence    Past Surgical History:  Procedure Laterality Date   ABDOMINAL AORTAGRAM N/A 03/13/2014   Procedure: ABDOMINAL Maxcine Ham;  Surgeon: Elam Dutch, MD;  Location: [facility] CATH LAB;  Service: Cardiovascular;  Laterality: N/A;   ABDOMINAL AORTIC ANEURYSM REPAIR     ABDOMINAL AORTIC ANEURYSM REPAIR  ?2012   ABDOMINAL HYSTERECTOMY     partial   BACK SURGERY     CARDIAC CATHETERIZATION     CORONARY ANGIOPLASTY  03/2015   CORONARY ARTERY BYPASS GRAFT N/A 01/17/2021   Procedure: CORONARY ARTERY BYPASS GRAFTING (CABG) TIMES 3 , ON PUMP, USING LEFT INTERNAL MAMMARY ARTERY AND ENDOSCOPICALLY HARVESTED RIGHT GREATER SAPHENOUS VEIN;  Surgeon: Lajuana Matte, MD;  Location: [place];  Service: Open Heart Surgery;  Laterality: N/A;   ENDOVEIN HARVEST OF GREATER SAPHENOUS VEIN Right 01/17/2021   Procedure: ENDOVEIN HARVEST OF GREATER SAPHENOUS VEIN;  Surgeon: Lajuana Matte, MD;  Location: [place];  Service: Open Heart Surgery;  Laterality: Right;   EYE SURGERY Right    laser surgery for blood behind eye, loss of sight   FEMORAL-POPLITEAL BYPASS GRAFT Left 07/06/2014   Procedure: BYPASS GRAFT FEMORAL-POPLITEAL ARTERY;  Surgeon: Elam Dutch, MD;  Location: [place];  Service: Vascular;  Laterality: Left;   HERNIA REPAIR     JOINT REPLACEMENT     LEFT HEART CATH AND CORONARY ANGIOGRAPHY N/A 01/11/2021   Procedure: LEFT HEART CATH AND CORONARY ANGIOGRAPHY;  Surgeon: [name], MD;  Location: [place] CV LAB;  Service: Cardiovascular;  Laterality: N/A;   LEFT HEART CATHETERIZATION WITH CORONARY ANGIOGRAM N/A 04/12/2014   Procedure: LEFT HEART CATHETERIZATION WITH CORONARY ANGIOGRAM;  Surgeon: Clent Demark, MD;  Location: [place] CATH LAB;  Service: Cardiovascular;  Laterality: N/A;   PERCUTANEOUS CORONARY STENT INTERVENTION (PCI-S)  04/12/2014   Procedure: PERCUTANEOUS CORONARY STENT  INTERVENTION (PCI-S);  Surgeon: Clent Demark, MD;  Location: [facility] CATH LAB;  Service: Cardiovascular;;  prox and mid RCA   PERIPHERAL VASCULAR INTERVENTION  06/25/2019   Procedure: PERIPHERAL VASCULAR INTERVENTION;  Surgeon: Elam Dutch, MD;  Location: [place] CV LAB;  Service: Cardiovascular;;  Lt Renal and Rt Renal   RENAL ANGIOGRAPHY Bilateral 06/25/2019   Procedure: RENAL ANGIOGRAPHY;  Surgeon: Elam Dutch, MD;  Location: [place] CV LAB;  Service: Cardiovascular;  Laterality: Bilateral;   TEE WITHOUT CARDIOVERSION N/A 01/17/2021   Procedure: TRANSESOPHAGEAL ECHOCARDIOGRAM (TEE);  Surgeon: Lajuana Matte, MD;  Location: [place];  Service: Open Heart Surgery;  Laterality: N/A;   TOTAL HIP ARTHROPLASTY Left 05/18/2015   TOTAL HIP ARTHROPLASTY Left 05/18/2015   Procedure: LEFT TOTAL HIP ARTHROPLASTY ANTERIOR APPROACH;  Surgeon: Mcarthur Rossetti, MD;  Location: [place];  Service: Orthopedics;  Laterality: Left;   TOTAL KNEE ARTHROPLASTY Left ~ 2003   TUBAL LIGATION     uterine tumor     VENTRAL HERNIA REPAIR      Family History  Problem Relation Age of Onset   Diabetes Mother    Stroke Mother    Hypertension Father    Stroke Father    Hyperlipidemia Sister    Hypertension Sister    Aneurysm Sister    Hyperlipidemia Brother    Hypertension Brother    Social History:   reports that she has been smoking cigarettes. She has a 20.00 pack-year smoking history. She has never used smokeless tobacco. She reports that she does not drink alcohol and does not use drugs.  Medications  Current Facility-Administered Medications:    0.9 %  sodium chloride infusion, 250 mL, Intravenous, Continuous, Gold, Wayne E, PA-C   acetaminophen (TYLENOL) tablet 1,000 mg, 1,000 mg, Oral, Q6H, 1,000 mg at 01/19/21 1141 **OR** acetaminophen (TYLENOL) 160 MG/5ML solution 1,000 mg, 1,000 mg, Per Tube, Q6H, Gold, Wayne E, PA-C, 1,000 mg at 01/18/21 0533   amLODipine (NORVASC) tablet 5 mg,  5 mg, Oral, Daily, Agarwala, Ravi, MD, 5 mg at 01/19/21 1141   aspirin EC tablet 325 mg, 325 mg, Oral, Daily, 325 mg at 01/19/21 1141 **OR** aspirin chewable tablet 324 mg, 324 mg, Per Tube, Daily, Gold, Wayne E, PA-C, 324 mg at 01/18/21 0931   bisacodyl (DULCOLAX) EC tablet 10 mg, 10 mg, Oral, Daily, 10 mg at 01/19/21 1140 **OR** bisacodyl (DULCOLAX) suppository 10 mg, 10 mg, Rectal, Daily, Gold, Wayne E, PA-C   Chlorhexidine Gluconate Cloth 2 % PADS 6 each, 6 each, Topical, Daily, Lightfoot, Harrell O, MD, 6 each at 01/18/21 0948   clopidogrel (PLAVIX) tablet 75 mg, 75 mg, Oral, Daily, Lightfoot, Harrell O, MD, 75 mg at 01/19/21 1141   enoxaparin (LOVENOX) injection 30 mg, 30 mg, Subcutaneous, QHS, Gold, Wayne E, PA-C   hydrALAZINE (APRESOLINE) injection 10 mg, 10 mg, Intravenous, Q6H PRN, Lightfoot,  Lucile Crater, MD, 10 mg at 01/18/21 1311   CBG monitoring, , , Q4H **AND** insulin aspart (novoLOG) injection 0-24 Units, 0-24 Units, Subcutaneous, Q4H, Gold, Wayne E, PA-C, 2 Units at 01/19/21 0328   ipratropium-albuterol (DUONEB) 0.5-2.5 (3) MG/3ML nebulizer solution 3 mL, 3 mL, Nebulization, Q6H PRN, [name], MD, 3 mL at 01/18/21 2015   lactated ringers infusion, , Intravenous, Continuous, Gold, Wayne E, PA-C, Stopped at 01/18/21 1516   lidocaine (LIDODERM) 5 % 1 patch, 1 patch, Transdermal, Daily, Agarwala, Ravi, MD   metoprolol tartrate (LOPRESSOR) injection 2.5-5 mg, 2.5-5 mg, Intravenous, Q2H PRN, Gold, Wayne E, PA-C, 5 mg at 01/18/21 1401   metoprolol tartrate (LOPRESSOR) tablet 50 mg, 50 mg, Oral, BID **OR** [DISCONTINUED] metoprolol tartrate (LOPRESSOR) 25 mg/10 mL oral suspension 12.5 mg, 12.5 mg, Per Tube, BID, Gold, Wayne E, PA-C   ondansetron (ZOFRAN) injection 4 mg, 4 mg, Intravenous, Q6H PRN, Gold, Wayne E, PA-C, 4 mg at 01/19/21 1214   pantoprazole (PROTONIX) EC tablet 40 mg, 40 mg, Oral, Daily, Gold, Wayne E, PA-C, 40 mg at 01/19/21 1141   QUEtiapine (SEROQUEL) tablet 25 mg, 25 mg,  Oral, QHS PRN, Gleason, Otilio Carpen, PA-C   sodium chloride flush (NS) 0.9 % injection 10-40 mL, 10-40 mL, Intracatheter, PRN, Lightfoot, Harrell O, MD   sodium chloride flush (NS) 0.9 % injection 3 mL, 3 mL, Intravenous, PRN, Gold, Wayne E, PA-C   traMADol (ULTRAM) tablet 50 mg, 50 mg, Oral, Q6H PRN, Gleason, Otilio Carpen, PA-C  Exam as documented by NP, attending examination additionally documented below: Current vital signs: BP 127/60   Pulse 68   Temp 98.7 F (37.1 C) (Oral)   Resp 20   Ht 5\' 7"  (1.702 m)   Wt 81.7 kg   SpO2 97%   BMI 28.21 kg/m  Vital signs in last 24 hours: Temp:  [97.5 F (36.4 C)-98.7 F (37.1 C)] 98.7 F (37.1 C) (08/24 1057) Pulse Rate:  [68-78] 68 (08/23 2311) Resp:  [12-30] 20 (08/24 0800) BP: (88-169)/(50-84) 127/60 (08/24 0500) SpO2:  [89 %-100 %] 97 % (08/24 0800) Arterial Line BP: (94-180)/(42-85) 146/56 (08/24 0800) FiO2 (%):  [50 %-70 %] 50 % (08/23 2311) Weight:  [81.7 kg] 81.7 kg (08/24 0500)  PE: GENERAL: Fairly well appearing female in NAD. She is alert.  HEENT: normocephalic and atraumatic. Central line to right neck.  LUNGS - Normal respiratory effort.  CV - RRR on tele. ABDOMEN - Soft, nontender. Ext: warm, well perfused. Psych: affect is appropriate. Calm, cooperative.   NEURO:  Mental Status: Follows commands. Alert.  Speech/Language: speech is without dysarthria or aphasia.  Naming, repetition, fluency, and comprehension intact. Cranial Nerves:  II: PERRL 3 mm/brisk. visual fields full.  III, IV, VI: EOMI. Lid elevation symmetric and full.  V: sensation is intact and symmetrical to face.  VII: Smile is symmetrical.  VIII:hearing intact to voice. IX, X: palate elevation is symmetric. Phonation normal.  XI: normal sternocleidomastoid and trapezius muscle strength. MGQ:QPYPPJ is symmetrical without fasciculations.   Motor:  RUE: grips  4/5       triceps 5/5     biceps  4/5      LUE: grips  4-/5      triceps  4-/5      biceps    4-/5 BLEs with 5/5 strength to dorsal and plantar flexion.  Tone is normal. Bulk is normal.  Sensation- Intact to light touch bilaterally in all four extremities. Extinction absent to DSS. Light touch  is intact to RUE with slight decrease to light touch in right fingertips. Vibration is intact, but decreased to all 5 fingertips of right hand.  Coordination: No pronator drift.  Gait- deferred.  NIHSS:  1a Level of Consciousness: 0 1b LOC Questions: 0 1c LOC Commands: 0 2 Best Gaze: 0 3 Visual: 0 4 Facial Palsy: 0 5a Motor Arm - left: 0 5b Motor Arm - Right: 0 6a Motor Leg - Left: 1 6b Motor Leg - Right: 1 7 Limb Ataxia: 0 8 Sensory: 0 9 Best Language: 0 10 Dysarthria: 0 11 Extinction and Inattention: 0 TOTAL: 2  Labs I have reviewed labs in epic and the results pertinent to this consultation are: HbA1c 9.1% 01/17/21. LDL 180 01/10/21.  CBC    Component Value Date/Time   WBC 12.8 (H) 01/19/2021 0322   RBC 3.61 (L) 01/19/2021 0322   HGB 10.8 (L) 01/19/2021 0322   HCT 31.8 (L) 01/19/2021 0322   PLT 135 (L) 01/19/2021 0322   MCV 88.1 01/19/2021 0322   MCH 29.9 01/19/2021 0322   MCHC 34.0 01/19/2021 0322   RDW 15.6 (H) 01/19/2021 0322   LYMPHSABS 2.1 09/20/2020 0827   MONOABS 0.4 09/20/2020 0827   EOSABS 0.1 09/20/2020 0827   BASOSABS 0.1 09/20/2020 0827    CMP     Component Value Date/Time   NA 141 01/19/2021 0322   K 4.2 01/19/2021 0322   CL 108 01/19/2021 0322   CO2 24 01/19/2021 0322   GLUCOSE 152 (H) 01/19/2021 0322   BUN 16 01/19/2021 0322   CREATININE 1.88 (H) 01/19/2021 0322   CALCIUM 8.6 (L) 01/19/2021 0322   PROT 6.4 (L) 10/14/2019 0333   ALBUMIN 2.9 (L) 10/14/2019 0333   AST 17 10/14/2019 0333   ALT 10 10/14/2019 0333   ALKPHOS 45 10/14/2019 0333   BILITOT 0.9 10/14/2019 0333   GFRNONAA 27 (L) 01/19/2021 0322   GFRAA 35 (L) 10/14/2019 0333    Lipid Panel     Component Value Date/Time   CHOL 247 (H) 01/10/2021 0939   TRIG 139 01/10/2021 0939    HDL 39 (L) 01/10/2021 0939   CHOLHDL 6.3 01/10/2021 0939   VLDL 28 01/10/2021 0939   LDLCALC 180 (H) 01/10/2021 0939    Lab Results  Component Value Date   HGBA1C 9.1 (H) 01/17/2021    Echocardiogram 01/10/21:  EF 45-50%/ LVH, Grade I diastolic dysfunction. Hypokinesis of left ventricle. Normal biatrial sizes  Carotid duplex 01/09/2021 Right Carotid: Velocities in the right ICA are consistent with a 1-39%  stenosis.  Left Carotid: Velocities in the left ICA are consistent with a 1-39%  stenosis.   Assessment: 80 yo female who is s/p MI and CABG on 01/17/21. Yesterday, she told PCCM about her right hand numbness. This has resolved except for the fingertips. This could be a positioning injury from her surgery, but given her multiple stroke risk factors (prior history of stroke, PAD, CAD, tobacco abuse, DM, HLD, and HTN, one must consider cortical hand knob stroke, although, one would not expect this quick decrease of symptoms. Also, she has had secondary stroke prevention with ASA and Plavix s/p CABG (hx stent).   Impression: -paraesthesias right hand and wrist which have improved to only involve her fingertips now.  -s/p CABG.   Recommendations/Plan:  -MRI brain-patient prefers to see if her symptoms are resolved by tomorrow.  -Check TSH and Vitamin B12.  -Supplement Vitamin B12 if level not over 500.  -Keep a pillow under her  RUE to elevate elbow and hand.   Pt seen by Clance Boll, NP/Neuro and later by MD. Note/plan to be edited by MD as needed.  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  Neurology is consulted by Dr. Lynetta Mare for new onset right hand numbness/tingling and weakness that the patient noted on waking up from surgery and which has been gradually resolving.  Patient reports that this involved her entire hand but did not go into her arm previously, including the dorsal and volar surfaces of the hand.  She reports that at this time the tingling is restricted to the tips of all of her fingers.  Her general examination is notable for a generally comfortable patient in no acute distress sitting up in bed and chatting with family.  There is a central line in place in the right neck but no fluids or medications running.  She is breathing comfortably and perfusing all of her extremities well.  Her chest is tender at her sternotomy site but her abdominal exam is benign.  Her affect is appropriate.  She has some mild left upper extremity edema.  Her neurological examination is notable for normal mental status, normal cranial nerve examination, and motor examination notable for previously documented left-sided motor deficits (4/5 throughout the left upper extremity with an upper motor neuron pattern of weakness with extension affected slightly more than flexion, and notably her arm abduction is limited by postoperative pain, as well as some left lower extremity weakness).  She is 5/5 throughout the right upper extremity.  Reflexes are 2+ in the right biceps and brachioradialis, 3+ in the left biceps and brachioradialis.  She reports some numbness and paresthesias in the fingertips only of the right hand, as well as reduced temperature sensation on the left side compared to the right which she also attributes to her prior stroke.  Her coordination is intact and she is able to sign her name.  Gait was not tested as patient became bradycardic to the low 40s when standing with PT.  Assessment: Differential includes nerve stretch/compression injury during her procedure (less  likely given symptoms do not fit a nerve distribution as described), versus a cortical hand stroke as a periprocedural complication.  Given her rapid improvement with minimal symptoms at this point, MRI brain would not change management.  Stroke etiology would almost certainly be her procedure, if stroke was definitively found on MRI brain. Additionally as documented above and reviewed by myself her stroke work-up has been completed, and she is already nearly medically maximized from a stroke risk perspective with aspirin and Plavix.  Her perioperative atrial fibrillation is unlikely to contribute to long-term stroke risk but certainly recurrent atrial fibrillation would be an indication for anticoagulation.  While she has a statin allergy documented in her chart, she does have atorvastatin 40 mg daily on her home medication list.  Nevertheless her lipids are uncontrolled with an LDL of 180.  Her A1c is also uncontrolled at 9.1% and she continues to smoke which are all modifiable risk factors  Recommendations: -Atorvastatin increased to 80 mg nightly if patient is willing to tolerate this dosing, with referral to advanced lipid clinic for  consideration of PSK 9 inhibitor if she is unable it; goal LDL less than 70 -Glucose control per primary team and PCP for goal A1c less than 7% -MRI brain to be ordered on 8/25 if the patient's symptoms are persistent, per her preference to defer MRI brain today, for diagnostic certainty.  If this study is negative, EMG/nerve conduction study would be recommended if symptoms persist in 4 to 6 weeks -Continued counseling on risk factor modification including smoking cessation, medication adherence, diet, exercise, good blood pressure control, and goal A1c and LDL  Lesleigh Noe MD-PhD [facility] [phone]  Available 7 AM to 7 PM, outside these hours please contact Neurologist on call listed on AMION

## 2021-01-20 ENCOUNTER — Inpatient Hospital Stay (HOSPITAL_COMMUNITY): Payer: Medicare HMO

## 2021-01-20 DIAGNOSIS — J9601 Acute respiratory failure with hypoxia: Secondary | ICD-10-CM | POA: Diagnosis not present

## 2021-01-20 LAB — GLUCOSE, CAPILLARY
Glucose-Capillary: 103 mg/dL — ABNORMAL HIGH (ref 70–99)
Glucose-Capillary: 120 mg/dL — ABNORMAL HIGH (ref 70–99)
Glucose-Capillary: 122 mg/dL — ABNORMAL HIGH (ref 70–99)
Glucose-Capillary: 159 mg/dL — ABNORMAL HIGH (ref 70–99)
Glucose-Capillary: 175 mg/dL — ABNORMAL HIGH (ref 70–99)
Glucose-Capillary: 175 mg/dL — ABNORMAL HIGH (ref 70–99)

## 2021-01-20 LAB — VITAMIN B12: Vitamin B-12: 453 pg/mL (ref 180–914)

## 2021-01-20 LAB — CBC
HCT: 32.9 % — ABNORMAL LOW (ref 36.0–46.0)
Hemoglobin: 10.6 g/dL — ABNORMAL LOW (ref 12.0–15.0)
MCH: 29.2 pg (ref 26.0–34.0)
MCHC: 32.2 g/dL (ref 30.0–36.0)
MCV: 90.6 fL (ref 80.0–100.0)
Platelets: 157 10*3/uL (ref 150–400)
RBC: 3.63 MIL/uL — ABNORMAL LOW (ref 3.87–5.11)
RDW: 15.5 % (ref 11.5–15.5)
WBC: 12.6 10*3/uL — ABNORMAL HIGH (ref 4.0–10.5)
nRBC: 0 % (ref 0.0–0.2)

## 2021-01-20 LAB — BASIC METABOLIC PANEL
Anion gap: 12 (ref 5–15)
BUN: 26 mg/dL — ABNORMAL HIGH (ref 8–23)
CO2: 24 mmol/L (ref 22–32)
Calcium: 9.1 mg/dL (ref 8.9–10.3)
Chloride: 104 mmol/L (ref 98–111)
Creatinine, Ser: 2.03 mg/dL — ABNORMAL HIGH (ref 0.44–1.00)
GFR, Estimated: 24 mL/min — ABNORMAL LOW (ref >60)
Glucose, Bld: 168 mg/dL — ABNORMAL HIGH (ref 70–99)
Potassium: 4.6 mmol/L (ref 3.5–5.1)
Sodium: 140 mmol/L (ref 135–145)

## 2021-01-20 LAB — MAGNESIUM: Magnesium: 2.9 mg/dL — ABNORMAL HIGH (ref 1.7–2.4)

## 2021-01-20 LAB — TSH: TSH: 1.248 u[IU]/mL (ref 0.350–4.500)

## 2021-01-20 MED ORDER — ROSUVASTATIN CALCIUM 5 MG PO TABS
10.0000 mg | ORAL_TABLET | Freq: Every day | ORAL | Status: DC
  Administered 2021-01-21 – 2021-01-26 (×6): 10 mg via ORAL
  Filled 2021-01-20 (×5): qty 2

## 2021-01-20 MED ORDER — AMLODIPINE BESYLATE 10 MG PO TABS
10.0000 mg | ORAL_TABLET | Freq: Every day | ORAL | Status: DC
  Administered 2021-01-21 – 2021-01-25 (×5): 10 mg via ORAL
  Filled 2021-01-20 (×5): qty 1

## 2021-01-20 MED ORDER — VARENICLINE TARTRATE 0.5 MG PO TABS: 0.5000 mg | ORAL_TABLET | Freq: Two times a day (BID) | ORAL | Status: DC

## 2021-01-20 MED ORDER — VITAMIN B-12 1000 MCG PO TABS: 1000.0000 ug | ORAL_TABLET | Freq: Every day | ORAL | Status: DC

## 2021-01-20 MED ORDER — VARENICLINE TARTRATE 0.5 MG PO TABS
0.5000 mg | ORAL_TABLET | Freq: Every day | ORAL | Status: AC
Start: 2021-01-20 — End: 2021-01-22
  Administered 2021-01-20 – 2021-01-22 (×3): 0.5 mg via ORAL
  Filled 2021-01-20 (×3): qty 1

## 2021-01-20 MED ORDER — CYANOCOBALAMIN 1000 MCG/ML IJ SOLN
1000.0000 ug | Freq: Every day | INTRAMUSCULAR | Status: DC
  Administered 2021-01-20: 1000 ug via INTRAMUSCULAR
  Filled 2021-01-20: qty 1

## 2021-01-20 MED ORDER — CYANOCOBALAMIN 500 MCG PO TABS: 250.0000 ug | ORAL_TABLET | Freq: Every day | ORAL | Status: DC

## 2021-01-20 MED ORDER — ROSUVASTATIN CALCIUM 20 MG PO TABS
40.0000 mg | ORAL_TABLET | Freq: Every day | ORAL | Status: DC
  Administered 2021-01-20: 40 mg via ORAL
  Filled 2021-01-20: qty 2

## 2021-01-20 MED ORDER — CYANOCOBALAMIN 500 MCG PO TABS
250.0000 ug | ORAL_TABLET | Freq: Every day | ORAL | Status: DC
  Administered 2021-01-21 – 2021-01-27 (×7): 250 ug via ORAL
  Filled 2021-01-20 (×8): qty 1

## 2021-01-20 MED ORDER — FENTANYL CITRATE (PF) 100 MCG/2ML IJ SOLN
12.5000 ug | Freq: Once | INTRAMUSCULAR | Status: DC
Start: 2021-01-20 — End: 2021-01-20

## 2021-01-20 MED ORDER — VARENICLINE TARTRATE 0.5 MG PO TABS
0.5000 mg | ORAL_TABLET | Freq: Two times a day (BID) | ORAL | Status: AC
Start: 2021-01-23 — End: 2021-01-26
  Administered 2021-01-23 – 2021-01-26 (×7): 0.5 mg via ORAL
  Filled 2021-01-20 (×8): qty 1

## 2021-01-20 MED FILL — Electrolyte-R (PH 7.4) Solution: INTRAVENOUS | Qty: 4000 | Status: AC

## 2021-01-20 MED FILL — Mannitol IV Soln 20%: INTRAVENOUS | Qty: 500 | Status: AC

## 2021-01-20 MED FILL — Albumin, Human Inj 5%: INTRAVENOUS | Qty: 250 | Status: AC

## 2021-01-20 MED FILL — Sodium Bicarbonate IV Soln 8.4%: INTRAVENOUS | Qty: 50 | Status: AC

## 2021-01-20 MED FILL — Heparin Sodium (Porcine) Inj 1000 Unit/ML: INTRAMUSCULAR | Qty: 20 | Status: AC

## 2021-01-20 MED FILL — Calcium Chloride Inj 10%: INTRAVENOUS | Qty: 10 | Status: AC

## 2021-01-20 NOTE — Progress Notes (Signed)
Neurology Progress Note  S: Her right hand is the same as yesterday, no better, no worse. The numbness in her right hand is gone, but still has it in the fingertips just like yesterday. No numbness and tingling anywhere else. No weakness of extremity except sequelae weakness in LUE from prior stroke. She does tell NP that she has been choking with eating, like food getting stuck. Will order SLP evaluation.   She can not remember what type of reaction Atorvastatin caused, but is willing to try Rosuvastatin after explanation about her LDL.   O: Current vital signs: BP (!) 152/63   Pulse 64   Temp (!) 96.7 F (35.9 C) (Axillary)   Resp 19   Ht 5\' 7"  (1.702 m)   Wt 83 kg   SpO2 94%   BMI 28.66 kg/m  Vital signs in last 24 hours: Temp:  [96.7 F (35.9 C)-98.7 F (37.1 C)] 96.7 F (35.9 C) (08/25 0410) Pulse Rate:  [64] 64 (08/24 2141) Resp:  [11-30] 19 (08/25 0700) BP: (116-161)/(55-75) 152/63 (08/25 0700) SpO2:  [92 %-97 %] 94 % (08/25 0700) Arterial Line BP: (132-146)/(53-66) 134/62 (08/24 1100) Weight:  [83 kg] 83 kg (08/25 0434)  GENERAL: Chronically ill  appearing and appears more well than yesterday's exam.  Awake, alert in NAD. HEENT: Normocephalic and atraumatic. LUNGS: Normal respiratory effort.  CV: RRR. Ext: warm. Her right hand is cooler than left.   NEURO:  Alert. Follows commands. Oriented. No facial asymmetry. Sensation intact light touch but decreased in right finger tips. Same for vibration. Involves all 5 fingers. Strength 5/5 except LUE grip 4/5, triceps 4/5, biceps 4/5.   Medications  Current Facility-Administered Medications:    0.9 %  sodium chloride infusion, 250 mL, Intravenous, Continuous, Gold, Wayne E, PA-C   acetaminophen (TYLENOL) tablet 1,000 mg, 1,000 mg, Oral, Q6H, 1,000 mg at 01/20/21 0559 **OR** acetaminophen (TYLENOL) 160 MG/5ML solution 1,000 mg, 1,000 mg, Per Tube, Q6H, Gold, Wayne E, PA-C, 1,000 mg at 01/18/21 0533   amLODipine (NORVASC)  tablet 5 mg, 5 mg, Oral, Daily, Agarwala, Ravi, MD, 5 mg at 01/19/21 1141   aspirin EC tablet 81 mg, 81 mg, Oral, Daily, Lightfoot, Harrell O, MD   bisacodyl (DULCOLAX) EC tablet 10 mg, 10 mg, Oral, Daily, 10 mg at 01/19/21 1140 **OR** bisacodyl (DULCOLAX) suppository 10 mg, 10 mg, Rectal, Daily, Gold, Wayne E, PA-C   Chlorhexidine Gluconate Cloth 2 % PADS 6 each, 6 each, Topical, Daily, Lightfoot, Lucile Crater, MD, 6 each at 01/20/21 0603   clopidogrel (PLAVIX) tablet 75 mg, 75 mg, Oral, Daily, Lightfoot, Harrell O, MD, 75 mg at 01/19/21 1141   enoxaparin (LOVENOX) injection 30 mg, 30 mg, Subcutaneous, QHS, Gold, Wayne E, PA-C, 30 mg at 01/19/21 2141   hydrALAZINE (APRESOLINE) injection 10 mg, 10 mg, Intravenous, Q6H PRN, Lightfoot, Harrell O, MD, 10 mg at 01/18/21 1311   CBG monitoring, , , Q4H **AND** insulin aspart (novoLOG) injection 0-24 Units, 0-24 Units, Subcutaneous, Q4H, Gold, Wayne E, PA-C, 2 Units at 01/20/21 0419   ipratropium-albuterol (DUONEB) 0.5-2.5 (3) MG/3ML nebulizer solution 3 mL, 3 mL, Nebulization, Q6H PRN, [name], MD, 3 mL at 01/18/21 2015   lactated ringers infusion, , Intravenous, Continuous, Gold, Wayne E, PA-C, Stopped at 01/18/21 1516   lidocaine (LIDODERM) 5 % 1 patch, 1 patch, Transdermal, Daily, Agarwala, Ravi, MD   MEDLINE mouth rinse, 15 mL, Mouth Rinse, BID, Lightfoot, Harrell O, MD, 15 mL at 01/19/21 2216   metoprolol tartrate (LOPRESSOR)  injection 2.5-5 mg, 2.5-5 mg, Intravenous, Q2H PRN, Gold, Wayne E, PA-C, 5 mg at 01/18/21 1401   metoprolol tartrate (LOPRESSOR) tablet 50 mg, 50 mg, Oral, BID, 50 mg at 01/19/21 2141 **OR** [DISCONTINUED] metoprolol tartrate (LOPRESSOR) 25 mg/10 mL oral suspension 12.5 mg, 12.5 mg, Per Tube, BID, Gold, Wayne E, PA-C   ondansetron (ZOFRAN) injection 4 mg, 4 mg, Intravenous, Q6H PRN, Gold, Wayne E, PA-C, 4 mg at 01/19/21 1845   pantoprazole (PROTONIX) EC tablet 40 mg, 40 mg, Oral, Daily, Gold, Wayne E, PA-C, 40 mg at 01/19/21  1141   QUEtiapine (SEROQUEL) tablet 25 mg, 25 mg, Oral, QHS PRN, Gleason, Otilio Carpen, PA-C   sodium chloride flush (NS) 0.9 % injection 10-40 mL, 10-40 mL, Intracatheter, PRN, Lightfoot, Harrell O, MD   sodium chloride flush (NS) 0.9 % injection 3 mL, 3 mL, Intravenous, PRN, Gold, Wayne E, PA-C  Pertinent Labs TSH 1.248. B12 453.   Assessment:  80 yo female who is s/p MI and CABG on 01/17/21. Neurology was consulted for right hand numbness s/p surgery. The numbness still remains in the right fingertips, all 5 fingers. It is no better nor worse today. After speaking to patient about the chances of hand knob stroke, she agrees to MRI brain. Spoke to patient about statins. She does not remember the reaction she had with Atorvastatin. Agrees to try Rosuvastatin. It is good that she has secondary stroke prevention with ASA and Plavix. For the tingling, we checked B12 and it is low per neurology standards.    Impression: -paraesthesias in right fingertips.  -Vitamin B12 potential functional deficiency vs. Low normal level.  -Hand knob stroke vs. positional injury during surgery.  -dysphagia.   Recommendations/Plan:  -Rosuvastatin 10 mg po nightly, based on patient's renal function; may be adjusted if her CrCl improves to > 30  Estimated Creatinine Clearance: 24.1 mL/min (A) (by C-G formula based on SCr of 2.03 mg/dL (H)).  -Vitamin B12 250 mg po qd, goal level > 500  -SLP evaluation.  -Given later in the day the patient reported her numbness has improved, can hold off on MRI  Pt seen by Clance Boll, MSN, APN-BC/Nurse Practitioner/Neuro and later by MD. Note and plan to be edited as needed by MD.  Pager: [phone]  Attending Neurologist's note:  I personally saw this patient, gathering history, performing a full neurologic examination, reviewing relevant labs, formulated the assessment and plan, adding the note above for completeness and clarity to accurately reflect my thoughts  Lesleigh Noe MD-PhD [facility] [phone] Available 7 AM to 7 PM, outside these hours please contact Neurologist on call listed on AMION

## 2021-01-20 NOTE — Progress Notes (Signed)
While preparing to transport patient to MRI, MD informed RN that he was cancelling the MRI order and would speak to CCM team.    RN informed MRI and transport staff of cancelled order.

## 2021-01-20 NOTE — Progress Notes (Signed)
   NAME:  [name], MRN:  [id number], DOB:  July 21, 1940, LOS: 10 ADMISSION DATE:  01/09/2021, CONSULTATION DATE:  01/20/21 REFERRING MD: Dr. Kipp Brood,  CHIEF COMPLAINT: Hypertension, altered mental status  History of Present Illness:  [name] is an 80 year old female with a past medical history significant for AAA, type 2 diabetes, CKD stage III, hypertension, hyperlipidemia, STEMI in 2018 with stent placement who presented to the emergency department on 8/15 with a complaint of chest pain or shortness of breath.  Found to have an NSTEMI with multivessel disease on cardiac cath, CT surgery consulted for CABG. she underwent CABG x3 with LIMA to the LAD reverse saphenous graft to the distal RCA and OM1.   She was extubated postop on 8/23.  And had an episode of hypertension along with altered mental status and pulling off nasal cannula oxygen with sats dropping, in this context PCCM was consulted.  Pertinent  Medical History   has a past medical history of AAA (abdominal aortic aneurysm) ([place]), Aneurysm ([place]), Arthritis, Constipation, Diabetes mellitus, GERD (gastroesophageal reflux disease), Glaucoma, Hyperlipidemia, Hypertension, Kidney stones, Myocardial infarction [facility]), Peripheral vascular disease ([place]), Pneumonia, Stroke ([place]) (01/2015), TIA (transient ischemic attack), and Urinary incontinence.   Significant Hospital Events: Including procedures, antibiotic start and stop dates in addition to other pertinent events   8/16 Admit with NSTEMI 8/22 Intubated for CABG 8/23 extubated, PCCM consult for HTN and encephalopathy 8/25 off bipap   Interim History / Subjective:  Off BiPAP, stable   Objective   Blood pressure (!) 128/117, pulse 64, temperature (!) 96.7 F (35.9 C), temperature source Axillary, resp. rate 20, height 5\' 7"  (1.702 m), weight 83 kg, SpO2 93 %.        Intake/Output Summary (Last 24 hours) at 01/20/2021 1149 Last data filed at 01/20/2021 1000 Gross per 24 hour   Intake 254.63 ml  Output 785 ml  Net -530.37 ml   Filed Weights   01/18/21 0436 01/19/21 0500 01/20/21 0434  Weight: 80.4 kg 81.7 kg 83 kg   General: Elderly female reclined in bed NAD  Neuro: AAOx3 following commands  HEENT: NCAT pink mm anicteric sclera. RIJ CVC Cardiovascular: rr s1s2 no rgm Lungs: even unlabored. CTAb on [place] Abdomen: soft round ndnt normoactive  Musculoskeletal: no acute joint deformity no cyanosis or clubbing GU: foley  Skin: c/d/w no rash  Resolved Hospital Problem list     Assessment & Plan:   Acute metabolic encephalopathy-- stable, improving gradually.  Suspect this is metabolic and related to dissipating sedation along with possible delirium P - delirium precautions  - minimize CNS depressing meds as able   Acute hypoxic respiratory failure -- improving  Small pleural effusions, atelectasis  P - Wean as able  - IS, mobility   POD3 CABG x 3 NSTEMI  CAD HTN Afib  P -post op per CVTS  -amio -Optimize lytes  -metop amlodipine crestor   AKI P - Hold further diuresis for today.  DM P -SSI  Tobacco use disorder P -cessation counseling, needs support/resources   R hand paresthesia - improved     From CCM standpoint, doing well and stable. Likely ready to transfer out of ICU Will dc foley and CVC    CRITICAL CARE Performed by: Cristal Generous  Total critical care time: n/a  minutes   Eliseo Gum MSN, AGACNP-BC [place] for pager  01/20/2021, 11:49 AM

## 2021-01-20 NOTE — NC FL2 (Signed)
[place] MEDICAID FL2 LEVEL OF CARE SCREENING TOOL     IDENTIFICATION  Patient Name: [name] Birthdate: [birth year] Sex: female Admission Date (Current Location): 01/09/2021  [facility] and [place] Number:  Herbalist and Address:  [facility], [place] [address] [place],  [address]      Provider Number: [id number]  Attending Physician Name and Address:  Lajuana Matte, MD  Relative Name and Phone Number:  [name] [phone]    Current Level of Care: Hospital Recommended Level of Care: [place] Prior Approval Number:    Date Approved/Denied:   PASRR Number: [id number]  Discharge Plan: SNF    Current Diagnoses: Patient Active Problem List   Diagnosis Date Noted   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   NSTEMI (non-ST elevated myocardial infarction) ([place]) 01/10/2021   CAD (coronary artery disease) 01/10/2021   CVA (cerebral vascular accident) ([place]) 10/11/2019   Hypertensive urgency 04/07/2019   CKD (chronic kidney disease) stage 3, GFR 30-59 ml/min ([place]) 04/07/2019   Chest pain 04/28/2017   History of ST elevation myocardial infarction (STEMI) 04/28/2017   Grief reaction 04/28/2017   Osteoarthritis of left hip 05/18/2015   Status post total replacement of left hip 05/18/2015   Coronary artery disease involving native coronary artery of native heart with angina pectoris ([place]) 04/16/2015   History of stroke 04/16/2015   Type 2 diabetes mellitus with circulatory disorder ([place]) 04/16/2015   Cerebrovascular 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 artery ([place]) 04/16/2015   HLD (hyperlipidemia)    Acute CVA (cerebrovascular accident) ([place]) 02/19/2015   Transient ischemic attack (TIA) 02/18/2015   Stroke [facility])    Cerebral infarction due to thrombosis of right middle cerebral artery (HCC)    Acute myocardial infarction of inferolateral wall ([place]) 04/12/2014   Numbness 03/18/2014   TIA (transient  ischemic attack) 03/18/2014   DM (diabetes mellitus) type II uncontrolled, periph vascular disorder ([place]) 03/18/2014   Hyperlipidemia    Hypertension    PVD (peripheral vascular disease) ([place]) 03/05/2014   Transient cerebral ischemia 08/01/2012   Atherosclerosis of native arteries of extremity with intermittent claudication ([place]) 10/19/2011   Unspecified disorders of arteries and arterioles 10/19/2011   PAD (peripheral artery disease) ([place]) 04/13/2011    Orientation RESPIRATION BLADDER Height & Weight     Self, Time, Situation, Place  O2 (Nasal Cannula 3 liters) Continent, External catheter (urethral catheter latex prove 14 fr.) Weight: 182 lb 15.7 oz (83 kg) Height:  5\' 7"  (170.2 cm)  BEHAVIORAL SYMPTOMS/MOOD NEUROLOGICAL BOWEL NUTRITION STATUS      Continent Diet (Please see discharge summary)  AMBULATORY STATUS COMMUNICATION OF NEEDS Skin   Limited Assist Verbally Other (Comment) (Incision closed leg right,Incision closed chest other)                       Personal Care Assistance Level of Assistance  Bathing, Feeding, Dressing Bathing Assistance: Limited assistance Feeding assistance: Independent (able to feed self) Dressing Assistance: Limited assistance     Functional Limitations Info  Sight, Hearing, Speech   Hearing Info: Adequate Speech Info: Adequate    SPECIAL CARE FACTORS FREQUENCY  PT (By licensed PT), OT (By licensed OT)     PT Frequency: 5x min weekly OT Frequency: 5x min weekly            Contractures Contractures Info: Not present    Additional Factors Info  Code Status, Allergies, Insulin Sliding Scale Code Status  Info: FULL Allergies Info: Keflex (cephalexin),Lipitor (atorvastatin),Codeine,Hydrochlorothiazide,Hydrocodone-acetaminophen,Metformin Hcl,Tramadol Hcl,Azor (amlodipine-olmesartan),Lisinopril,Penicillins   Insulin Sliding Scale Info: insulin aspart (novoLOG) injection 0-24 Units every 4 hours       Current Medications  (01/20/2021):  This is the current hospital active medication list Current Facility-Administered Medications  Medication Dose Route Frequency Provider Last Rate Last Admin   0.9 %  sodium chloride infusion  250 mL Intravenous Continuous Gold, Wayne E, PA-C       acetaminophen (TYLENOL) tablet 1,000 mg  1,000 mg Oral Q6H Gold, Wayne E, PA-C   1,000 mg at 01/20/21 1153   Or   acetaminophen (TYLENOL) 160 MG/5ML solution 1,000 mg  1,000 mg Per Tube Q6H Gold, Wayne E, PA-C   1,000 mg at 01/18/21 0533   [START ON 01/21/2021] amLODipine (NORVASC) tablet 10 mg  10 mg Oral Daily Kipp Brood, MD       aspirin EC tablet 81 mg  81 mg Oral Daily Lajuana Matte, MD   81 mg at 01/20/21 1006   bisacodyl (DULCOLAX) EC tablet 10 mg  10 mg Oral Daily Gold, Wayne E, PA-C   10 mg at 01/19/21 1140   Or   bisacodyl (DULCOLAX) suppository 10 mg  10 mg Rectal Daily Gold, Wayne E, PA-C       Chlorhexidine Gluconate Cloth 2 % PADS 6 each  6 each Topical Daily Lajuana Matte, MD   6 each at 01/20/21 1007   clopidogrel (PLAVIX) tablet 75 mg  75 mg Oral Daily Lajuana Matte, MD   75 mg at 01/20/21 1006   enoxaparin (LOVENOX) injection 30 mg  30 mg Subcutaneous QHS Gold, Wayne E, PA-C   30 mg at 01/19/21 2141   hydrALAZINE (APRESOLINE) injection 10 mg  10 mg Intravenous Q6H PRN Lajuana Matte, MD   10 mg at 01/18/21 1311   insulin aspart (novoLOG) injection 0-24 Units  0-24 Units Subcutaneous Q4H Gold, Wayne E, PA-C   2 Units at 01/20/21 1153   ipratropium-albuterol (DUONEB) 0.5-2.5 (3) MG/3ML nebulizer solution 3 mL  3 mL Nebulization Q6H PRN [name], MD   3 mL at 01/18/21 2015   lactated ringers infusion   Intravenous Continuous Gold, Wayne E, PA-C   Stopped at 01/18/21 1516   lidocaine (LIDODERM) 5 % 1 patch  1 patch Transdermal Daily Kipp Brood, MD       MEDLINE mouth rinse  15 mL Mouth Rinse BID Lajuana Matte, MD   15 mL at 01/20/21 1007   metoprolol tartrate (LOPRESSOR)  injection 2.5-5 mg  2.5-5 mg Intravenous Q2H PRN Jadene Pierini E, PA-C   5 mg at 01/18/21 1401   metoprolol tartrate (LOPRESSOR) tablet 50 mg  50 mg Oral BID Kipp Brood, MD   50 mg at 01/20/21 1006   ondansetron (ZOFRAN) injection 4 mg  4 mg Intravenous Q6H PRN Jadene Pierini E, PA-C   4 mg at 01/19/21 1845   pantoprazole (PROTONIX) EC tablet 40 mg  40 mg Oral Daily Gold, Wayne E, PA-C   40 mg at 01/20/21 1006   QUEtiapine (SEROQUEL) tablet 25 mg  25 mg Oral QHS PRN Gleason, Otilio Carpen, PA-C       [START ON 01/21/2021] rosuvastatin (CRESTOR) tablet 10 mg  10 mg Oral QHS Bhagat, Srishti L, MD       sodium chloride flush (NS) 0.9 % injection 10-40 mL  10-40 mL Intracatheter PRN Lightfoot, Lucile Crater, MD       sodium chloride flush (  NS) 0.9 % injection 3 mL  3 mL Intravenous PRN Gold, Wayne E, PA-C       vitamin B-12 (CYANOCOBALAMIN) tablet 250 mcg  250 mcg Oral Daily Bhagat, Srishti L, MD         Discharge Medications: Please see discharge summary for a list of discharge medications.  Relevant Imaging Results:  Relevant Lab Results:   Additional Information [id number]  Trula Ore, LCSWA

## 2021-01-20 NOTE — Evaluation (Signed)
Occupational Therapy Evaluation Patient Details Name: [name] MRN: [id number] DOB: [birth year] Today's Date: 01/20/2021    History of Present Illness Pt is an 80 y/o female admitted 8/14 with chest pain and SOB. s/p L hear cath with diagnosis of multivessel CAD, pt s/p CABG x3 8/22.  Neurology consulted 8/24 due to numbness and tingling of R hand.  PMHx: AAA, DM, Glaucoma, HTN, MI, PVD, Stroke, daily smoker.   Clinical Impression   PTA patient independent with ADLs, limited IADLs recently (but driving) and using rollator for mobility. She was admitted for above and limited by problem list below, including generalized weakness, impaired balance, sternal precautions, decreased functional use of L side (reports weaker than baseline-RN notified), and impaired cognition.  Pt oriented and following simple commands, but demonstrating decreased recall of sternal precautions, difficulty following multiple step commands and decreased problem solving.  Pt requires max assist +2 for transfers using eva walker, up to total assist for ADLs. She will benefit from further OT services while admitted and after dc at SNF level to optimize independence and safety with ADLs, mobility.     Follow Up Recommendations  SNF;Supervision/Assistance - 24 hour    Equipment Recommendations  Other (comment) (TBD at next venue of care)    Recommendations for Other Services       Precautions / Restrictions Precautions Precautions: Fall;Sternal Precaution Comments: reviewed with pt Restrictions Other Position/Activity Restrictions: sternal precautions      Mobility Bed Mobility Overal bed mobility: Needs Assistance Bed Mobility: Rolling;Sidelying to Sit;Sit to Supine Rolling: Mod assist Sidelying to sit: Mod assist       General bed mobility comments: cueing for technique and adherence to sternal precautions; requires assist for trunk support to ascend and fully bringing LEs towards EOB     Transfers Overall transfer level: Needs assistance Equipment used:  (eva walker) Transfers: Sit to/from [facility] Sit to Stand: Max assist;+2 physical assistance;+2 safety/equipment Stand pivot transfers: Mod assist;+2 physical assistance;+2 safety/equipment       General transfer comment: max assist +2 to power up and steady from EOB with cueing for hand placement on knee and use of momentum; pivoting with eva walker to recliner with mod assist given cueing for increased step length with R LE    Balance Overall balance assessment: Needs assistance Sitting-balance support: No upper extremity supported;Feet supported Sitting balance-Leahy Scale: Fair Sitting balance - Comments: limited dynamically, pt reports feeling as if she is leaning to the L and working harder to maintain midline position statically   Standing balance support: Bilateral upper extremity supported;During functional activity Standing balance-Leahy Scale: Poor Standing balance comment: relies on BUE and external support                           ADL either performed or assessed with clinical judgement   ADL Overall ADL's : Needs assistance/impaired     Grooming: Minimal assistance;Sitting           Upper Body Dressing : Maximal assistance;Sitting   Lower Body Dressing: Total assistance;+2 for physical assistance;+2 for safety/equipment;Sit to/from stand Lower Body Dressing Details (indicate cue type and reason): requires assist for socks, max assist +2 sit to stand Toilet Transfer: Maximal assistance;+2 for physical assistance;+2 for safety/equipment;Stand-pivot (eva walker) Toilet Transfer Details (indicate cue type and reason): simulated to recliner         Functional mobility during ADLs: Maximal assistance;+2 for physical assistance;+2 for safety/equipment (eva) General ADL Comments:  pt limited by L sided weakness, impaired balance and decreased activity tolerance;  sternal precautions     Vision   Vision Assessment?: No apparent visual deficits     Perception     Praxis      Pertinent Vitals/Pain Pain Assessment: Faces Faces Pain Scale: Hurts even more Pain Location: sternal- incisonal with mobility Pain Descriptors / Indicators: Aching;Grimacing;Guarding;Sore Pain Intervention(s): Monitored during session;Repositioned     Hand Dominance Right   Extremity/Trunk Assessment Upper Extremity Assessment Upper Extremity Assessment: LUE deficits/detail;RUE deficits/detail RUE Deficits / Details: denies numbness/tingling, coordination WFL and grossly 3+/5 MMT RUE Sensation: WNL RUE Coordination: WNL LUE Deficits / Details: Hx of weakness from CVA, mild edema with decreased coordination; grossly 3-/5MMT LUE Sensation: WNL LUE Coordination: decreased fine motor;decreased gross motor   Lower Extremity Assessment Lower Extremity Assessment: Defer to PT evaluation       Communication Communication Communication: No difficulties   Cognition Arousal/Alertness: Awake/alert Behavior During Therapy: WFL for tasks assessed/performed Overall Cognitive Status: Impaired/Different from baseline Area of Impairment: Problem solving;Following commands;Safety/judgement;Awareness                       Following Commands: Follows one step commands consistently;Follows one step commands with increased time;Follows multi-step commands inconsistently Safety/Judgement: Decreased awareness of safety;Decreased awareness of deficits Awareness: Emergent Problem Solving: Slow processing;Decreased initiation;Requires verbal cues General Comments: pt oriented and following simple commands, requires cueing for sternal precautions and problem solving   General Comments  VSS on 2L [place], BP stable; on RA EOB desaturation to 86% and redonned 2L    Exercises     Shoulder Instructions      Home Living Family/patient expects to be discharged to:: Skilled  nursing facility                                        Prior Functioning/Environment Level of Independence: Independent with assistive device(s)        Comments: uses rollator for mobility, drives and completes IADls --reports increased difficulty recently and family providing assistance with meals        OT Problem List: Decreased strength;Decreased activity tolerance;Decreased range of motion;Impaired balance (sitting and/or standing);Decreased coordination;Decreased cognition;Decreased safety awareness;Decreased knowledge of use of DME or AE;Decreased knowledge of precautions;Cardiopulmonary status limiting activity;Pain;Impaired UE functional use      OT Treatment/Interventions: Self-care/ADL training;Therapeutic exercise;DME and/or AE instruction;Therapeutic activities;Cognitive remediation/compensation;Patient/family education;Balance training    OT Goals(Current goals can be found in the care plan section) Acute Rehab OT Goals Patient Stated Goal: to get stronger OT Goal Formulation: With patient Time For Goal Achievement: 02/03/21 Potential to Achieve Goals: Good  OT Frequency: Min 2X/week   Barriers to D/C:            Co-evaluation PT/OT/SLP Co-Evaluation/Treatment: Yes Reason for Co-Treatment: For patient/therapist safety;To address functional/ADL transfers   OT goals addressed during session: ADL's and self-care      AM-PAC OT "6 Clicks" Daily Activity     Outcome Measure Help from another person eating meals?: A Little Help from another person taking care of personal grooming?: A Little Help from another person toileting, which includes using toliet, bedpan, or urinal?: Total Help from another person bathing (including washing, rinsing, drying)?: A Lot Help from another person to put on and taking off regular upper body clothing?: A Lot Help from another person to put on and taking off  regular lower body clothing?: Total 6 Click Score: 12    End of Session Equipment Utilized During Treatment: Other (comment);Oxygen (eva) Nurse Communication: Mobility status;Precautions  Activity Tolerance: Patient tolerated treatment well Patient left: in chair;with call bell/phone within reach;with chair alarm set  OT Visit Diagnosis: Other abnormalities of gait and mobility (R26.89);Muscle weakness (generalized) (M62.81);Pain;Other symptoms and signs involving cognitive function Pain - part of body:  (sternal)                Time: 4643-1427 OT Time Calculation (min): 39 min Charges:  OT General Charges $OT Visit: 1 Visit OT Evaluation $OT Eval Moderate Complexity: 1 Mod OT Treatments $Self Care/Home Management : 8-22 mins  Jolaine Artist, OT Acute Rehabilitation Services Pager [phone] Office [phone]   Delight Stare 01/20/2021, 12:34 PM

## 2021-01-20 NOTE — Progress Notes (Addendum)
T[facility] DAILY ICU PROGRESS NOTE                   [address]            [place] [address]          [phone]   3 Days Post-Op Procedure(s) (LRB): CORONARY ARTERY BYPASS GRAFTING (CABG) TIMES 3 , ON PUMP, USING LEFT INTERNAL MAMMARY ARTERY AND ENDOSCOPICALLY HARVESTED RIGHT GREATER SAPHENOUS VEIN (N/A) TRANSESOPHAGEAL ECHOCARDIOGRAM (TEE) (N/A) APPLICATION OF CELL SAVER ENDOVEIN HARVEST OF GREATER SAPHENOUS VEIN (Right)  Total Length of Stay:  LOS: 10 days   Subjective: Feeling better  Objective: Vital signs in last 24 hours: Temp:  [96.7 F (35.9 C)-98.7 F (37.1 C)] 96.7 F (35.9 C) (08/25 0410) Pulse Rate:  [64] 64 (08/24 2141) Cardiac Rhythm: Sinus bradycardia (08/25 0400) Resp:  [11-30] 19 (08/25 0700) BP: (116-161)/(55-75) 152/63 (08/25 0700) SpO2:  [92 %-97 %] 94 % (08/25 0700) Arterial Line BP: (132-146)/(53-66) 134/62 (08/24 1100) Weight:  [83 kg] 83 kg (08/25 0434)  Filed Weights   01/18/21 0436 01/19/21 0500 01/20/21 0434  Weight: 80.4 kg 81.7 kg 83 kg    Weight change: 1.3 kg   Hemodynamic parameters for last 24 hours: CVP:  [8 mmHg-16 mmHg] 8 mmHg  Intake/Output from previous day: 08/24 0701 - 08/25 0700 In: 321.3 [P.O.:100; I.V.:221.3] Out: 650 [Urine:650]  Intake/Output this shift: No intake/output data recorded.  Current Meds: Scheduled Meds:  acetaminophen  1,000 mg Oral Q6H   Or   acetaminophen (TYLENOL) oral liquid 160 mg/5 mL  1,000 mg Per Tube Q6H   amLODipine  5 mg Oral Daily   aspirin EC  81 mg Oral Daily   bisacodyl  10 mg Oral Daily   Or   bisacodyl  10 mg Rectal Daily   Chlorhexidine Gluconate Cloth  6 each Topical Daily   clopidogrel  75 mg Oral Daily   cyanocobalamin  1,000 mcg Intramuscular Daily   enoxaparin (LOVENOX) injection  30 mg Subcutaneous QHS   insulin aspart  0-24 Units Subcutaneous Q4H   lidocaine  1 patch Transdermal Daily   mouth rinse  15 mL Mouth Rinse BID   metoprolol tartrate  50 mg  Oral BID   pantoprazole  40 mg Oral Daily   [START ON 01/26/2021] vitamin B-12  1,000 mcg Oral Daily   Continuous Infusions:  sodium chloride     lactated ringers Stopped (01/18/21 1516)   PRN Meds:.hydrALAZINE, ipratropium-albuterol, metoprolol tartrate, ondansetron (ZOFRAN) IV, QUEtiapine, sodium chloride flush, sodium chloride flush  General appearance: alert, cooperative, and no distress Neurologic: right hand sx improved with minimal numbness Heart: regular rate and rhythm Lungs: min dim in bases Abdomen: benign Extremities: no edema Wound: incis healing well  Lab Results: CBC: Recent Labs    01/19/21 0322 01/20/21 0428  WBC 12.8* 12.6*  HGB 10.8* 10.6*  HCT 31.8* 32.9*  PLT 135* 157   BMET:  Recent Labs    01/19/21 0322 01/20/21 0428  NA 141 140  K 4.2 4.6  CL 108 104  CO2 24 24  GLUCOSE 152* 168*  BUN 16 26*  CREATININE 1.88* 2.03*  CALCIUM 8.6* 9.1    CMET: Lab Results  Component Value Date   WBC 12.6 (H) 01/20/2021   HGB 10.6 (L) 01/20/2021   HCT 32.9 (L) 01/20/2021   PLT 157 01/20/2021   GLUCOSE 168 (H) 01/20/2021   CHOL 247 (H) 01/10/2021   TRIG 139 01/10/2021  HDL 39 (L) 01/10/2021   LDLCALC 180 (H) 01/10/2021   ALT 10 10/14/2019   AST 17 10/14/2019   NA 140 01/20/2021   K 4.6 01/20/2021   CL 104 01/20/2021   CREATININE 2.03 (H) 01/20/2021   BUN 26 (H) 01/20/2021   CO2 24 01/20/2021   TSH 1.248 01/20/2021   INR 1.2 01/17/2021   HGBA1C 9.1 (H) 01/17/2021      PT/INR:  Recent Labs    01/17/21 2002  LABPROT 15.5*  INR 1.2   Radiology: No results found.   Assessment/Plan: S/P Procedure(s) (LRB): CORONARY ARTERY BYPASS GRAFTING (CABG) TIMES 3 , ON PUMP, USING LEFT INTERNAL MAMMARY ARTERY AND ENDOSCOPICALLY HARVESTED RIGHT GREATER SAPHENOUS VEIN (N/A) TRANSESOPHAGEAL ECHOCARDIOGRAM (TEE) (N/A) APPLICATION OF CELL SAVER ENDOVEIN HARVEST OF GREATER SAPHENOUS VEIN (Right)  1 afebrile, VSS SBP 110's -160's, on norvasc/metoprolol  - sinus brady/Sinus rhythm,  CVP 8-, IV amiodarone has been stopped- ? Add po  2 sats ok on 3 liters 3 weight trending up, creat still trending higher, 2.03. BUN 26- UOP has dropped- will need some diuresis 4 leukocytosis stable 5 H/H stable 6 thrombocytopenia resolved 7 glucose well controlled 8 neurology has seen- MRI ordered for today , nerve conduction studies in future if sx not resolved and MRI neg 9 swallow study ordered 10 TSH normal 11 B12 level normal 12 appreciate PCCM/ Neuro assistance 13 will need at least short term SNF, will add OT consult, TOC order placed    Laura Giovanni PA-C Pager [phone] 01/20/2021 7:35 AM  Mental status much improved No issues with right hand today.  Consider canceling MRI Creat up again.  Descent uop Continue ICU care  [place]

## 2021-01-20 NOTE — TOC Initial Note (Signed)
Transition of Care [facility]) - Initial/Assessment Note    Patient Details  Name: [name] MRN: [id number] Date of Birth: Feb 23, 1941  Transition of [facility]) CM/SW Contact:    [name], [place] Phone Number: 01/20/2021, 11:49 AM  Clinical Narrative:                  CSW received consult for possible SNF placement at time of discharge. CSW spoke with patient at bedside regarding PT recommendation of SNF placement at time of discharge. Patient comes from home alone. Patient expressed understanding of PT recommendation and is agreeable to SNF placement at time of discharge. Patient gave CSW permission to fax out initial referral near the [place] area. CSW discussed insurance authorization process .Patient has received the COVID vaccines as well as booster. Patient expressed being hopeful for rehab and to feel better soon. No further questions reported at this time. CSW to continue to follow and assist with discharge planning needs.   Expected Discharge Plan: Skilled Nursing Facility Barriers to Discharge: Continued Medical Work up   Patient Goals and CMS Choice Patient states their goals for this hospitalization and ongoing recovery are:: to go to SNF CMS Medicare.gov Compare Post Acute Care list provided to:: Patient Choice offered to / list presented to : Patient  Expected Discharge Plan and Services Expected Discharge Plan: [place] In-house Referral: Clinical Social Work     Living arrangements for the past 2 months: Apartment                                      Prior Living Arrangements/Services Living arrangements for the past 2 months: Apartment Lives with:: Self Patient language and need for interpreter reviewed:: Yes Do you feel safe going back to the place where you live?: No   SNF  Need for Family Participation in Patient Care: Yes (Comment) Care giver support system in place?: Yes (comment)   Criminal Activity/Legal Involvement  Pertinent to Current Situation/Hospitalization: No - Comment as needed  Activities of Daily Living Home Assistive Devices/Equipment: None ADL Screening (condition at time of admission) Patient's cognitive ability adequate to safely complete daily activities?: Yes Is the patient deaf or have difficulty hearing?: No Does the patient have difficulty seeing, even when wearing glasses/contacts?: No Does the patient have difficulty concentrating, remembering, or making decisions?: No Patient able to express need for assistance with ADLs?: Yes Does the patient have difficulty dressing or bathing?: No Independently performs ADLs?: Yes (appropriate for developmental age) Does the patient have difficulty walking or climbing stairs?: Yes Weakness of Legs: Both Weakness of Arms/Hands: None  Permission Sought/Granted Permission sought to share information with : Case Manager, Family Supports, Chartered certified accountant granted to share information with : Yes, Verbal Permission Granted  Share Information with NAME: [name]  Permission granted to share info w AGENCY: SNF  Permission granted to share info w Relationship: daughter  Permission granted to share info w Contact Information: [name] [phone]  Emotional Assessment Appearance:: Appears stated age Attitude/Demeanor/Rapport: Gracious Affect (typically observed): Calm Orientation: : Oriented to Self, Oriented to Place, Oriented to  Time, Oriented to Situation Alcohol / Substance Use: Not Applicable Psych Involvement: No (comment)  Admission diagnosis:  Exertional chest pain [R07.9] NSTEMI (non-ST elevated myocardial infarction) ([place]) [I21.4] S/P CABG x 3 [Z95.1] Patient Active Problem List   Diagnosis Date Noted   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  NSTEMI (non-ST elevated myocardial infarction) ([place]) 01/10/2021   CAD (coronary artery disease) 01/10/2021   CVA (cerebral vascular accident) ([place]) 10/11/2019   Hypertensive  urgency 04/07/2019   CKD (chronic kidney disease) stage 3, GFR 30-59 ml/min ([place]) 04/07/2019   Chest pain 04/28/2017   History of ST elevation myocardial infarction (STEMI) 04/28/2017   Grief reaction 04/28/2017   Osteoarthritis of left hip 05/18/2015   Status post total replacement of left hip 05/18/2015   Coronary artery disease involving native coronary artery of native heart with angina pectoris ([place]) 04/16/2015   History of stroke 04/16/2015   Type 2 diabetes mellitus with circulatory disorder ([place]) 04/16/2015   Cerebrovascular 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 artery ([place]) 04/16/2015   HLD (hyperlipidemia)    Acute CVA (cerebrovascular accident) ([place]) 02/19/2015   Transient ischemic attack (TIA) 02/18/2015   Stroke ([place])    Cerebral infarction due to thrombosis of right middle cerebral artery (HCC)    Acute myocardial infarction of inferolateral wall ([place]) 04/12/2014   Numbness 03/18/2014   TIA (transient ischemic attack) 03/18/2014   DM (diabetes mellitus) type II uncontrolled, periph vascular disorder ([place]) 03/18/2014   Hyperlipidemia    Hypertension    PVD (peripheral vascular disease) ([place]) 03/05/2014   Transient cerebral ischemia 08/01/2012   Atherosclerosis of native arteries of extremity with intermittent claudication ([place]) 10/19/2011   Unspecified disorders of arteries and arterioles 10/19/2011   PAD (peripheral artery disease) ([place]) 04/13/2011   PCP:  [name], MD Pharmacy:   [facility], [address] AT [place] [address] Phone: [phone] Fax: [phone]     Social Determinants of Health (SDOH) Interventions    Readmission Risk Interventions No flowsheet data found.

## 2021-01-20 NOTE — Progress Notes (Signed)
Subjective:  Patient seen earlier this morning doing well.  Denies any chest pain or shortness of breath.  Up in chair. Right arm numbness almost resolved. Objective:  Vital Signs in the last 24 hours: Temp:  [96.7 F (35.9 C)-98.4 F (36.9 C)] 98.3 F (36.8 C) (08/25 1151) Pulse Rate:  [64] 64 (08/24 2141) Resp:  [16-27] 18 (08/25 1500) BP: (128-161)/(55-117) 148/72 (08/25 1500) SpO2:  [92 %-97 %] 96 % (08/25 1500) Weight:  [83 kg] 83 kg (08/25 0434)  Intake/Output from previous day: 08/24 0701 - 08/25 0700 In: 321.3 [P.O.:100; I.V.:221.3] Out: 710 [Urine:710] Intake/Output from this shift: Total I/O In: -  Out: 270 [Urine:270]  Physical Exam: Neck: no adenopathy, no carotid bruit, no JVD, and supple, symmetrical, trachea midline Lungs: decreased breath sounds at bases.  Air entry has improved Heart: regular rate and rhythm, S1, S2 normal, and no pericardial rub Abdomen: soft, non-tender; bowel sounds normal; no masses,  no organomegaly Extremities: extremities normal, atraumatic, no cyanosis or edema  Lab Results: Recent Labs    01/19/21 0322 01/20/21 0428  WBC 12.8* 12.6*  HGB 10.8* 10.6*  PLT 135* 157   Recent Labs    01/19/21 0322 01/20/21 0428  NA 141 140  K 4.2 4.6  CL 108 104  CO2 24 24  GLUCOSE 152* 168*  BUN 16 26*  CREATININE 1.88* 2.03*   No results for input(s): TROPONINI in the last 72 hours.  Invalid input(s): CK, MB Hepatic Function Panel No results for input(s): PROT, ALBUMIN, AST, ALT, ALKPHOS, BILITOT, BILIDIR, IBILI in the last 72 hours. No results for input(s): CHOL in the last 72 hours. No results for input(s): PROTIME in the last 72 hours.  Imaging: DG Chest Port 1 View  Result Date: 01/19/2021 CLINICAL DATA:  Status post cardiac surgery. EXAM: PORTABLE CHEST 1 VIEW COMPARISON:  January 18, 2021. FINDINGS: Stable cardiomegaly. Right venous sheath is unchanged. No pneumothorax is noted. Bibasilar opacities are noted concerning for  atelectasis or edema with associated pleural effusions. Bony thorax is unremarkable. IMPRESSION: Grossly stable bibasilar opacities concerning for atelectasis or edema with associated pleural effusions. Electronically Signed   By: Marijo Conception M.D.   On: 01/19/2021 08:45   DG CHEST PORT 1 VIEW  Result Date: 01/18/2021 CLINICAL DATA:  Respiratory failure, acute EXAM: PORTABLE CHEST 1 VIEW COMPARISON:  01/18/2021 FINDINGS: Layering bilateral effusions. Cardiomegaly, vascular congestion. Bilateral lower lobe airspace opacities, right greater than left. Interval extubation and removal of NG tube. Changes of CABG. Right central line is unchanged. IMPRESSION: Bilateral layering effusions and bibasilar atelectasis or infiltrates. Mild vascular congestion. Electronically Signed   By: Rolm Baptise M.D.   On: 01/18/2021 18:06    Cardiac Studies:  Assessment/Plan:  Acute NSTEMI status post left cardiac catheterization, 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  status post CABG 3, postop day 3, doing well Status post paroxysmal A. 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  Status post PCI to RCA in November 2015 Hypertension. Hyperlipidemia. Diabetes mellitus. History of CVA in the past Peripheral vascular disease, history of renal stenting and femoral popliteal bypass in the past. 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 Acute on Chronic kidney disease, stage IIIB, Tobacco abuse. Postop atelectasis Plan Continue present management. Encouraged incentive spirometry. Increase ambulation as tolerated  LOS: 10 days    [name] 01/20/2021, 4:10 PM

## 2021-01-20 NOTE — Evaluation (Signed)
Clinical/Bedside Swallow Evaluation Patient Details  Name: [name] MRN: [id number] Date of Birth: [birth year]  Today's Date: 01/20/2021 Time: SLP Start Time (ACUTE ONLY): 7096 SLP Stop Time (ACUTE ONLY): 0909 SLP Time Calculation (min) (ACUTE ONLY): 17 min  Past Medical History:  Past Medical History:  Diagnosis Date   AAA (abdominal aortic aneurysm) ([place])    Aneurysm ([place])    Arthritis    osteoarthritis   Constipation    Diabetes mellitus    Type 2, on Glimiperide   GERD (gastroesophageal reflux disease)    Glaucoma    Hyperlipidemia    Hypertension    Kidney stones    Myocardial infarction [facility])    Peripheral vascular disease ([place])    Pneumonia    Stroke ([place]) 01/2015   with left side weakness    TIA (transient ischemic attack)    Urinary incontinence    Past Surgical History:  Past Surgical History:  Procedure Laterality Date   ABDOMINAL AORTAGRAM N/A 03/13/2014   Procedure: ABDOMINAL Maxcine Ham;  Surgeon: Elam Dutch, MD;  Location: [facility] CATH LAB;  Service: Cardiovascular;  Laterality: N/A;   ABDOMINAL AORTIC ANEURYSM REPAIR     ABDOMINAL AORTIC ANEURYSM REPAIR  ?2012   ABDOMINAL HYSTERECTOMY     partial   BACK SURGERY     CARDIAC CATHETERIZATION     CORONARY ANGIOPLASTY  03/2015   CORONARY ARTERY BYPASS GRAFT N/A 01/17/2021   Procedure: CORONARY ARTERY BYPASS GRAFTING (CABG) TIMES 3 , ON PUMP, USING LEFT INTERNAL MAMMARY ARTERY AND ENDOSCOPICALLY HARVESTED RIGHT GREATER SAPHENOUS VEIN;  Surgeon: Lajuana Matte, MD;  Location: [place];  Service: Open Heart Surgery;  Laterality: N/A;   ENDOVEIN HARVEST OF GREATER SAPHENOUS VEIN Right 01/17/2021   Procedure: ENDOVEIN HARVEST OF GREATER SAPHENOUS VEIN;  Surgeon: Lajuana Matte, MD;  Location: [place];  Service: Open Heart Surgery;  Laterality: Right;   EYE SURGERY Right    laser surgery for blood behind eye, loss of sight   FEMORAL-POPLITEAL BYPASS GRAFT Left 07/06/2014   Procedure: BYPASS GRAFT  FEMORAL-POPLITEAL ARTERY;  Surgeon: Elam Dutch, MD;  Location: [facility] OR;  Service: Vascular;  Laterality: Left;   HERNIA REPAIR     JOINT REPLACEMENT     LEFT HEART CATH AND CORONARY ANGIOGRAPHY N/A 01/11/2021   Procedure: LEFT HEART CATH AND CORONARY ANGIOGRAPHY;  Surgeon: [name], MD;  Location: [place] CV LAB;  Service: Cardiovascular;  Laterality: N/A;   LEFT HEART CATHETERIZATION WITH CORONARY ANGIOGRAM N/A 04/12/2014   Procedure: LEFT HEART CATHETERIZATION WITH CORONARY ANGIOGRAM;  Surgeon: Clent Demark, MD;  Location: [place] CATH LAB;  Service: Cardiovascular;  Laterality: N/A;   PERCUTANEOUS CORONARY STENT INTERVENTION (PCI-S)  04/12/2014   Procedure: PERCUTANEOUS CORONARY STENT INTERVENTION (PCI-S);  Surgeon: Clent Demark, MD;  Location[facility] C[facility] LAB;  Service: Cardiovascular;;  prox and mid RCA   PERIPHERAL VASCULAR INTERVENTION  06/25/2019   Procedure: PERIPHERAL VASCULAR INTERVENTION;  Surgeon: Elam Dutch, MD;  Location: [place] CV LAB;  Service: Cardiovascular;;  Lt Renal and Rt Renal   RENAL ANGIOGRAPHY Bilateral 06/25/2019   Procedure: RENAL ANGIOGRAPHY;  Surgeon: Elam Dutch, MD;  Location: [place] CV LAB;  Service: Cardiovascular;  Laterality: Bilateral;   TEE WITHOUT CARDIOVERSION N/A 01/17/2021   Procedure: TRANSESOPHAGEAL ECHOCARDIOGRAM (TEE);  Surgeon: Lajuana Matte, MD;  Location: [place];  Service: Open Heart Surgery;  Laterality: N/A;   TOTAL HIP ARTHROPLASTY Left 05/18/2015   TOTAL HIP ARTHROPLASTY Left 05/18/2015  Procedure: LEFT TOTAL HIP ARTHROPLASTY ANTERIOR APPROACH;  Surgeon: Mcarthur Rossetti, MD;  Location: [place];  Service: Orthopedics;  Laterality: Left;   TOTAL KNEE ARTHROPLASTY Left ~ 2003   TUBAL LIGATION     uterine tumor     VENTRAL HERNIA REPAIR     HPI:  Pt is an 80 y.o. female who presented 8/14 with chest pain and SOB. Pt s/p L hear cath with diagnosis of multivessel CAD, s/p CABG x3 8/22.  Neurology  consulted 8/24 due to numbness and tingling of R hand. SLP consulted due to coughing with p.o. intake and globus sensation. PMH: AAA, diabetes mellitus type 2, CKD 3B, hypertension, hyperlipidemia, STEMI in 2018 treated with stent placement who presents for dilation of chest pain.   Assessment / Plan / Recommendation Clinical Impression  Pt was seen for bedside swallow evaluation and she stated that she occasionally coughs with p.o. intake if she eats quickly, but that she has learned to eat slowly. Pt stated that yesterday the foods were too dry and that she felt that they "wouldn't go down", but that these symptoms have improved and that she has tolerated solids and liquids today without difficulty. Oral mechanism exam was [facility]. Dentition was reduced with maxillary dentures and reduced mandibular teeth. She tolerated all solids and liquids without signs or symptoms of oropharyngeal dysphagia. A regular texture diet with thin liquids is recommended at this time. Considering pt's reported difficulty, SLP will follow briefly to ensure diet tolerance and to determine whether further intervention is clinically indicated. SLP Visit Diagnosis: Dysphagia, unspecified (R13.10)    Aspiration Risk  Mild aspiration risk    Diet Recommendation Regular;Thin liquid   Liquid Administration via: Cup;Straw Medication Administration: Whole meds with liquid Supervision: Patient able to self feed;Intermittent supervision to cue for compensatory strategies Compensations: Slow rate;Small sips/bites Postural Changes: Seated upright at 90 degrees    Other  Recommendations     Follow up Recommendations None      Frequency and Duration min 1 x/week  1 week       Prognosis Prognosis for Safe Diet Advancement: Good      Swallow Study   General Date of Onset: 01/19/21 HPI: Pt is an 80 y.o. female who presented 8/14 with chest pain and SOB. Pt s/p L hear cath with diagnosis of multivessel CAD, s/p CABG x3 8/22.   Neurology consulted 8/24 due to numbness and tingling of R hand. SLP consulted due to coughing with p.o. intake and globus sensation. PMH: AAA, diabetes mellitus type 2, CKD 3B, hypertension, hyperlipidemia, STEMI in 2018 treated with stent placement who presents for dilation of chest pain. Type of Study: Bedside Swallow Evaluation Previous Swallow Assessment: none Diet Prior to this Study: Regular;Thin liquids Temperature Spikes Noted: No Respiratory Status: Nasal cannula History of Recent Intubation: No Behavior/Cognition: Alert;Cooperative;Pleasant mood Oral Cavity Assessment: Within Functional Limits Oral Care Completed by SLP: No Oral Cavity - Dentition: Dentures, top;Missing dentition Vision: Functional for self-feeding Self-Feeding Abilities: Needs assist Patient Positioning: Upright in bed;Postural control adequate for testing Baseline Vocal Quality: Normal Volitional Cough: Weak Volitional Swallow: Able to elicit    Oral/Motor/Sensory Function Overall Oral Motor/Sensory Function: Within functional limits   Ice Chips Ice chips: Within functional limits Presentation: Spoon   Thin Liquid Thin Liquid: Within functional limits Presentation: Straw    Nectar Thick Nectar Thick Liquid: Not tested   Honey Thick Honey Thick Liquid: Not tested   Puree Puree: Within functional limits Presentation: Spoon   Solid  Solid: Within functional limits Presentation: [place]. [place]in Negus, [place], [place] Office number [phone] Pager [phone]  Horton Marshall 01/20/2021,9:14 AM

## 2021-01-20 NOTE — Progress Notes (Signed)
Physical Therapy Treatment Patient Details Name: [name] MRN: [id number] DOB: 1941-04-23 Today's Date: 01/20/2021    History of Present Illness Pt is an 80 y/o female admitted 8/14 with chest pain and SOB. s/p L hear cath with diagnosis of multivessel CAD, pt s/p CABG x3 8/22.  Neurology consulted 8/24 due to numbness and tingling of R hand.  PMHx: AAA, DM, Glaucoma, HTN, MI, PVD, Stroke, daily smoker.    PT Comments    Pt progressing slowly toward goals.  Pt noticing L sided weakness that is not at her baseline function, pt reporting tendency to list L in sitting/standing, though she rights herself before others are aware.  Emphasis on transitions, sit to stands, standing balance/pre gait activity, and transfers to the chair.  Pt not ready for longer distance gait today.    Follow Up Recommendations  SNF;Supervision/Assistance - 24 hour     Equipment Recommendations  3in1 (PT)    Recommendations for Other Services       Precautions / Restrictions Precautions Precautions: Fall;Sternal Precaution Comments: reviewed with pt Restrictions Other Position/Activity Restrictions: sternal precautions    Mobility  Bed Mobility Overal bed mobility: Needs Assistance Bed Mobility: Rolling;Sidelying to Sit;Sit to Supine Rolling: Mod assist Sidelying to sit: Mod assist       General bed mobility comments: cueing for technique and adherence to sternal precautions; requires assist for trunk support to ascend and fully bringing LEs towards EOB    Transfers Overall transfer level: Needs assistance Equipment used:  (eva walker) Transfers: Sit to/from [facility] Sit to Stand: Max assist;+2 physical assistance;+2 safety/equipment Stand pivot transfers: Mod assist;+2 physical assistance;+2 safety/equipment       General transfer comment: max assist +2 to power up and steady from EOB with cueing for hand placement on knee and use of momentum; pivoting with eva walker  to recliner with mod assist given cueing for increased step length with R LE  Ambulation/Gait             General Gait Details: pivot in EVA walker to the chair only   Stairs             Wheelchair Mobility    Modified Rankin (Stroke Patients Only)       Balance Overall balance assessment: Needs assistance Sitting-balance support: No upper extremity supported;Feet supported Sitting balance-Leahy Scale: Fair Sitting balance - Comments: limited dynamically, pt reports feeling as if she is leaning to the L and working harder to maintain midline position statically   Standing balance support: Bilateral upper extremity supported;During functional activity Standing balance-Leahy Scale: Poor Standing balance comment: relies on BUE and external support                            Cognition Arousal/Alertness: Awake/alert Behavior During Therapy: WFL for tasks assessed/performed Overall Cognitive Status: Impaired/Different from baseline Area of Impairment: Problem solving;Following commands;Safety/judgement;Awareness                       Following Commands: Follows one step commands consistently;Follows one step commands with increased time;Follows multi-step commands inconsistently Safety/Judgement: Decreased awareness of safety;Decreased awareness of deficits Awareness: Emergent Problem Solving: Slow processing;Decreased initiation;Requires verbal cues General Comments: pt oriented and following simple commands, requires cueing for sternal precautions and problem solving      Exercises Other Exercises Other Exercises: warm up ROM to Bil LE's    General Comments General comments (skin integrity, edema,  etc.): VSS on 2L [place], BP stable; on RA EOB desaturation to 86% and redonned 2L      Pertinent Vitals/Pain Pain Assessment: Faces Faces Pain Scale: Hurts even more Pain Location: sternal- incisonal with mobility Pain Descriptors / Indicators:  Aching;Grimacing;Guarding;Sore Pain Intervention(s): Monitored during session    Home Living Family/patient expects to be discharged to:: Skilled nursing facility                    Prior Function Level of Independence: Independent with assistive device(s)      Comments: uses rollator for mobility, drives and completes IADls --reports increased difficulty recently and family providing assistance with meals   PT Goals (current goals can now be found in the care plan section) Acute Rehab PT Goals Patient Stated Goal: to get stronger PT Goal Formulation: With patient/family Time For Goal Achievement: 02/02/21 Potential to Achieve Goals: Good Progress towards PT goals: Progressing toward goals    Frequency    Min 3X/week      PT Plan Current plan remains appropriate    Co-evaluation PT/OT/SLP Co-Evaluation/Treatment: Yes   PT goals addressed during session: Mobility/safety with mobility        AM-PAC PT "6 Clicks" Mobility   Outcome Measure  Help needed turning from your back to your side while in a flat bed without using bedrails?: A Lot Help needed moving from lying on your back to sitting on the side of a flat bed without using bedrails?: A Lot Help needed moving to and from a bed to a chair (including a wheelchair)?: A Lot Help needed standing up from a chair using your arms (e.g., wheelchair or bedside chair)?: A Lot Help needed to walk in hospital room?: A Lot Help needed climbing 3-5 steps with a railing? : Total 6 Click Score: 11    End of Session Equipment Utilized During Treatment: Oxygen Activity Tolerance: Patient tolerated treatment well Patient left: in chair;with call bell/phone within reach;with chair alarm set Nurse Communication: Mobility status PT Visit Diagnosis: Other abnormalities of gait and mobility (R26.89);Muscle weakness (generalized) (M62.81);Difficulty in walking, not elsewhere classified (R26.2);Other symptoms and signs involving  the nervous system (R29.898)     Time: 9021-1155 PT Time Calculation (min) (ACUTE ONLY): 39 min  Charges:  $Therapeutic Activity: 8-22 mins                     01/20/2021  Ginger Carne., PT Acute Rehabilitation Services [phone]  (pager) [phone]  (office)   Tessie Fass Shirely Toren 01/20/2021, 4:59 PM

## 2021-01-21 LAB — BASIC METABOLIC PANEL
Anion gap: 9 (ref 5–15)
BUN: 30 mg/dL — ABNORMAL HIGH (ref 8–23)
CO2: 23 mmol/L (ref 22–32)
Calcium: 8.4 mg/dL — ABNORMAL LOW (ref 8.9–10.3)
Chloride: 107 mmol/L (ref 98–111)
Creatinine, Ser: 1.84 mg/dL — ABNORMAL HIGH (ref 0.44–1.00)
GFR, Estimated: 27 mL/min — ABNORMAL LOW (ref >60)
Glucose, Bld: 154 mg/dL — ABNORMAL HIGH (ref 70–99)
Potassium: 4.2 mmol/L (ref 3.5–5.1)
Sodium: 139 mmol/L (ref 135–145)

## 2021-01-21 LAB — GLUCOSE, CAPILLARY
Glucose-Capillary: 120 mg/dL — ABNORMAL HIGH (ref 70–99)
Glucose-Capillary: 125 mg/dL — ABNORMAL HIGH (ref 70–99)
Glucose-Capillary: 132 mg/dL — ABNORMAL HIGH (ref 70–99)
Glucose-Capillary: 146 mg/dL — ABNORMAL HIGH (ref 70–99)
Glucose-Capillary: 148 mg/dL — ABNORMAL HIGH (ref 70–99)
Glucose-Capillary: 170 mg/dL — ABNORMAL HIGH (ref 70–99)
Glucose-Capillary: 98 mg/dL (ref 70–99)

## 2021-01-21 LAB — ECHO INTRAOPERATIVE TEE
Height: 67 in
Weight: 2610.25 oz

## 2021-01-21 LAB — MAGNESIUM: Magnesium: 2.9 mg/dL — ABNORMAL HIGH (ref 1.7–2.4)

## 2021-01-21 MED ORDER — MAGNESIUM HYDROXIDE 400 MG/5ML PO SUSP: 30.0000 mL | Freq: Every day | ORAL | Status: DC | PRN

## 2021-01-21 MED ORDER — FUROSEMIDE 10 MG/ML IJ SOLN
20.0000 mg | Freq: Once | INTRAMUSCULAR | Status: AC
  Administered 2021-01-21: 20 mg via INTRAVENOUS
  Filled 2021-01-21: qty 2

## 2021-01-21 NOTE — Progress Notes (Signed)
Subjective:  Patient denies any chest pain nor shortness of breath.  States slowly getting stronger  Objective:  Vital Signs in the last 24 hours: Temp:  [97.6 F (36.4 C)-98 F (36.7 C)] 98 F (36.7 C) (08/26 0800) Pulse Rate:  [62] 62 (08/25 2112) Resp:  [13-28] 16 (08/26 0900) BP: (127-151)/(54-72) 138/59 (08/26 0900) SpO2:  [95 %-98 %] 98 % (08/26 0900) Weight:  [83.5 kg] 83.5 kg (08/26 0500)  Intake/Output from previous day: 08/25 0701 - 08/26 0700 In: -  Out: 670 [Urine:670] Intake/Output from this shift: Total I/O In: -  Out: 300 [Urine:300]  Physical Exam: Neck: no adenopathy, no carotid bruit, no JVD, and supple, symmetrical, trachea midline Lungs: decreased breath sounds at bases.  Air entry improved Heart: regular rate and rhythm and S1, S2 normal Abdomen: soft, non-tender; bowel sounds normal; no masses,  no organomegaly Extremities: extremities normal, atraumatic, no cyanosis or edema  Lab Results: Recent Labs    01/19/21 0322 01/20/21 0428  WBC 12.8* 12.6*  HGB 10.8* 10.6*  PLT 135* 157   Recent Labs    01/20/21 0428 01/21/21 0646  NA 140 139  K 4.6 4.2  CL 104 107  CO2 24 23  GLUCOSE 168* 154*  BUN 26* 30*  CREATININE 2.03* 1.84*   No results for input(s): TROPONINI in the last 72 hours.  Invalid input(s): CK, MB Hepatic Function Panel No results for input(s): PROT, ALBUMIN, AST, ALT, ALKPHOS, BILITOT, BILIDIR, IBILI in the last 72 hours. No results for input(s): CHOL in the last 72 hours. No results for input(s): PROTIME in the last 72 hours.  Imaging: No results found.  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  status post CABG 3, postop day4 doing well Status post paroxysmal A. 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  Status post PCI to RCA in November 2015 Hypertension. Hyperlipidemia. Diabetes  mellitus. History of CVA in the past Peripheral vascular disease, history of renal stenting and femoral popliteal bypass in the past. 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 Acute on Chronic kidney disease, stage IIIB,improved Tobacco abuse. Postop atelectasis Plan Continue present management. Encourage incentive spirometry.   LOS: 11 days    [name] 01/21/2021, 12:50 PM

## 2021-01-21 NOTE — Progress Notes (Signed)
   01/21/21 1126  Clinical Encounter Type  Visited With Patient  Visit Type Initial  Referral From Social work  Consult/Referral To Patent examiner responded to page regarding Forensic scientist from [place], [place]. Pt shared she wanted her daughter to be her healthcare agent and had already given her daughter the paperwork to fill out. She wasn't sure the status because her daughter took the paperwork home with her. Chaplain left an extra copy in case it was needed. Chaplain informed Pt how to reach [employer] once the paperwork was ready to be notarized, or if there were any questions. Chaplain remains available.  This note was prepared by Chaplain Resident, Dante Gang, MDiv. Chaplain remains available as needed through the on-call pager: [phone].

## 2021-01-21 NOTE — TOC Progression Note (Signed)
Transition of Care [facility]) - Progression Note    Patient Details  Name: [name] MRN: [id number] Date of Birth: [birth year]  Transition of Care [facility]) CM/SW [place], [place] Phone Number: 01/21/2021, 11:48 AM  Clinical Narrative:     CSW spoke with patient and patients daughter [name] and provided SNF bed offers. Patient and patients daughter are both in agreement for patient to go to [facility] when medically ready for dc. CSW called [name] who confirmed SNF bed for patient. CSW received verbal consult form patients nurse. Patient requesting assist with HCPOA paperwork. CSW spoke with chaplain who confirmed she will assist patient with HCPOA paperwork.CSW will start insurance auth close to patient being medically ready for dc. CSW will continue to follow and assist with dc planning needs.  Expected Discharge Plan: [place] Barriers to Discharge: Continued Medical Work up  Expected Discharge Plan and Services Expected Discharge Plan: [place] In-house Referral: Clinical Social Work     Living arrangements for the past 2 months: Apartment                                       Social Determinants of Health (SDOH) Interventions    Readmission Risk Interventions No flowsheet data found.

## 2021-01-21 NOTE — Progress Notes (Signed)
  Speech Language Pathology Treatment:    Patient Details Name: [name] MRN: [id number] DOB: 11/28/1940 Today's Date: 01/21/2021 Time: 0900-0919 SLP Time Calculation (min) (ACUTE ONLY): 19 min  Assessment / Plan / Recommendation Clinical Impression   Pt upright in bed resting upon arrival. She reports eating well with breakfast tray of oatmeal and eggs. She c/o mild discomfort described as food "scratching her throat" if it's dry. She was seen with thin liquids and regular consistency solid graham cracker. She endorsed the scratching with the graham cracker, which resolved with independent use of liquid wash.  Pt with no s/s aspiration. Suspect mild throat irritation from intubation during surgery. Anticipated to resolve over the next few days. No further ST indicated.  Should discomfort not resolve or s/s aspiration occur, please re-consult SLP for objective testing.   HPI HPI: Pt is an 80 y.o. female who presented 8/14 with chest pain and SOB. Pt s/p L heart cath with diagnosis of multivessel CAD, s/p CABG x3 8/22.  Neurology consulted 8/24 due to numbness and tingling of R hand. SLP consulted due to coughing with p.o. intake and globus sensation. PMH: AAA, diabetes mellitus type 2, CKD 3B, hypertension, hyperlipidemia, STEMI in 2018 treated with stent placement who presents for dilation of chest pain. Subsequently reporting discomfort with swallowing.      SLP Plan  All goals met;Discharge SLP treatment due to (comment)       Recommendations  Diet recommendations: Regular;Thin liquid Liquids provided via: Cup;Straw Medication Administration: Whole meds with liquid Supervision: Patient able to self feed Compensations: Small sips/bites Postural Changes and/or Swallow Maneuvers: Seated upright 90 degrees                SLP Visit Diagnosis: Dysphagia, unspecified (R13.10) Plan: All goals met;Discharge SLP treatment due to (comment)                   [name] P.  Cleon Thoma, M.S., [place] Pathologist Acute Rehabilitation Services Pager: [place] 01/21/2021, 9:35 AM

## 2021-01-21 NOTE — Plan of Care (Signed)
  Problem: Education: Goal: Knowledge of General Education information will improve Description: Including pain rating scale, medication(s)/side effects and non-pharmacologic comfort measures Outcome: Progressing   Problem: Health Behavior/Discharge Planning: Goal: Ability to manage health-related needs will improve Outcome: Progressing   Problem: Clinical Measurements: Goal: Ability to maintain clinical measurements within normal limits will improve Outcome: Progressing Goal: Will remain free from infection Outcome: Progressing Goal: Diagnostic test results will improve Outcome: Progressing Goal: Respiratory complications will improve Outcome: Progressing Goal: Cardiovascular complication will be avoided Outcome: Progressing   Problem: Nutrition: Goal: Adequate nutrition will be maintained Outcome: Progressing   Problem: Coping: Goal: Level of anxiety will decrease Outcome: Progressing   Problem: Pain Managment: Goal: General experience of comfort will improve Outcome: Progressing   Problem: Safety: Goal: Ability to remain free from injury will improve Outcome: Progressing   Problem: Education: Goal: Will demonstrate proper wound care and an understanding of methods to prevent future damage Outcome: Progressing Goal: Knowledge of disease or condition will improve Outcome: Progressing Goal: Knowledge of the prescribed therapeutic regimen will improve Outcome: Progressing Goal: Individualized Educational Video(s) Outcome: Progressing   Problem: Activity: Goal: Risk for activity intolerance will decrease Outcome: Progressing   Problem: Cardiac: Goal: Will achieve and/or maintain hemodynamic stability Outcome: Progressing   Problem: Clinical Measurements: Goal: Postoperative complications will be avoided or minimized Outcome: Progressing   Problem: Respiratory: Goal: Respiratory status will improve Outcome: Progressing   Problem: Skin Integrity: Goal:  Wound healing without signs and symptoms of infection Outcome: Progressing Goal: Risk for impaired skin integrity will decrease Outcome: Progressing   Problem: Urinary Elimination: Goal: Ability to achieve and maintain adequate renal perfusion and functioning will improve Outcome: Progressing

## 2021-01-21 NOTE — Progress Notes (Signed)
Physical Therapy Treatment Patient Details Name: [name] MRN: [id number] DOB: 11-17-1940 Today's Date: 01/21/2021    History of Present Illness Pt is an 80 y/o female admitted 8/14 with chest pain and SOB. s/p L hear cath with diagnosis of multivessel CAD, pt s/p CABG x3 8/22.  Neurology consulted 8/24 due to numbness and tingling of R hand.  PMHx: AAA, DM, Glaucoma, HTN, MI, PVD, Stroke, daily smoker.    PT Comments    Pt tired/fatigued on arrival, but agreeable to getting OOB.  Emphasis on transitions with assist following sternal precautions, scooting without UE's, sitting balance, sit to stands, pre gait activity prior to short distance gait training in the RW.    Follow Up Recommendations  SNF;Supervision/Assistance - 24 hour     Equipment Recommendations  3in1 (PT)    Recommendations for Other Services       Precautions / Restrictions Precautions Precautions: Fall;Sternal    Mobility  Bed Mobility Overal bed mobility: Needs Assistance Bed Mobility: Supine to Sit     Supine to sit: Mod assist;+2 for safety/equipment     General bed mobility comments: assist legs off the bed, pt was assist via truncal assist to build momentum to come up and forward.    Transfers Overall transfer level: Needs assistance   Transfers: Sit to/from Stand Sit to Stand: Mod assist;+2 physical assistance         General transfer comment: cues for hands,  assisted to build momentum to come forward and up.  Ambulation/Gait Ambulation/Gait assistance: Mod assist;+2 safety/equipment Gait Distance (Feet): 4 Feet (forward and 2 feet back to chair pull behind her.) Assistive device: Rolling walker (2 wheeled) Gait Pattern/deviations: Step-to pattern;Decreased step length - right;Decreased step length - left;Decreased stance time - left;Decreased stride length   Gait velocity interpretation: <1.31 ft/sec, indicative of household ambulator General Gait Details: mildly paretic with  RW,  some W/shift assist R and support in stance on the L LE.  step to pattern unles consistently cued to step through.   Stairs             Wheelchair Mobility    Modified Rankin (Stroke Patients Only)       Balance   Sitting-balance support: No upper extremity supported;Feet supported Sitting balance-Leahy Scale: Fair Sitting balance - Comments: pt stated feels less like she is leaning to the left today   Standing balance support: Bilateral upper extremity supported;During functional activity Standing balance-Leahy Scale: Poor Standing balance comment: relies on BUE and external support                            Cognition Arousal/Alertness: Awake/alert Behavior During Therapy: WFL for tasks assessed/performed Overall Cognitive Status: Within Functional Limits for tasks assessed (NT formally)                                        Exercises      General Comments General comments (skin integrity, edema, etc.): On 2 L [place] at 98% SpO2, on RA slowly dropped to sats of 88%, reapplied.      Pertinent Vitals/Pain Pain Assessment: Faces Faces Pain Scale: Hurts little more Pain Location: surgical site Pain Descriptors / Indicators: Sore Pain Intervention(s): Monitored during session    Home Living  Prior Function            PT Goals (current goals can now be found in the care plan section) Acute Rehab PT Goals Patient Stated Goal: to get stronger PT Goal Formulation: With patient/family Time For Goal Achievement: 02/02/21 Potential to Achieve Goals: Good Progress towards PT goals: Progressing toward goals    Frequency    Min 3X/week      PT Plan Current plan remains appropriate    Co-evaluation              AM-PAC PT "6 Clicks" Mobility   Outcome Measure  Help needed turning from your back to your side while in a flat bed without using bedrails?: A Lot Help needed moving from lying on  your back to sitting on the side of a flat bed without using bedrails?: A Lot Help needed moving to and from a bed to a chair (including a wheelchair)?: A Lot Help needed standing up from a chair using your arms (e.g., wheelchair or bedside chair)?: A Lot Help needed to walk in hospital room?: A Lot Help needed climbing 3-5 steps with a railing? : Total 6 Click Score: 11    End of Session Equipment Utilized During Treatment: Oxygen Activity Tolerance: Patient tolerated treatment well;Patient limited by fatigue Patient left: in chair;with call bell/phone within reach;with chair alarm set Nurse Communication: Mobility status PT Visit Diagnosis: Other abnormalities of gait and mobility (R26.89);Muscle weakness (generalized) (M62.81);Difficulty in walking, not elsewhere classified (R26.2);Other symptoms and signs involving the nervous system (R29.898)     Time: 0973-5329 PT Time Calculation (min) (ACUTE ONLY): 22 min  Charges:  $Therapeutic Activity: 8-22 mins                     01/21/2021  Ginger Carne., PT Acute Rehabilitation Services [phone]  (pager) [phone]  (office)   Tessie Fass Kennya Schwenn 01/21/2021, 6:38 PM

## 2021-01-21 NOTE — Progress Notes (Signed)
Patient ID: [name], female   DOB: 03-22-1941, 80 y.o.   MRN: [id number] TCTS Evening Rounds:  Hemodynamically stable in sinus rhythm.  Sats 95%  Good urine output.  Stable day.

## 2021-01-21 NOTE — Progress Notes (Addendum)
TCTS DAILY ICU PROGRESS NOTE                   [address]            [place] [address]          [phone]   4 Days Post-Op Procedure(s) (LRB): CORONARY ARTERY BYPASS GRAFTING (CABG) TIMES 3 , ON PUMP, USING LEFT INTERNAL MAMMARY ARTERY AND ENDOSCOPICALLY HARVESTED RIGHT GREATER SAPHENOUS VEIN (N/A) TRANSESOPHAGEAL ECHOCARDIOGRAM (TEE) (N/A) APPLICATION OF CELL SAVER ENDOVEIN HARVEST OF GREATER SAPHENOUS VEIN (Right)  Total Length of Stay:  LOS: 11 days   Subjective: Conts to feel better, ate some breakfast, ambulation slowly improving  Objective: Vital signs in last 24 hours: Temp:  [97.6 F (36.4 C)-98.3 F (36.8 C)] 98 F (36.7 C) (08/26 0800) Pulse Rate:  [62] 62 (08/25 2112) Cardiac Rhythm: Sinus bradycardia (08/26 0600) Resp:  [13-28] 19 (08/26 0700) BP: (127-154)/(54-117) 144/65 (08/26 0700) SpO2:  [93 %-98 %] 95 % (08/26 0700) Weight:  [83.5 kg] 83.5 kg (08/26 0500)  Filed Weights   01/19/21 0500 01/20/21 0434 01/21/21 0500  Weight: 81.7 kg 83 kg 83.5 kg    Weight change: 0.5 kg   Hemodynamic parameters for last 24 hours:    Intake/Output from previous day: 08/25 0701 - 08/26 0700 In: -  Out: 670 [Urine:670]  Intake/Output this shift: No intake/output data recorded.  Current Meds: Scheduled Meds:  acetaminophen  1,000 mg Oral Q6H   Or   acetaminophen (TYLENOL) oral liquid 160 mg/5 mL  1,000 mg Per Tube Q6H   amLODipine  10 mg Oral Daily   aspirin EC  81 mg Oral Daily   bisacodyl  10 mg Oral Daily   Or   bisacodyl  10 mg Rectal Daily   Chlorhexidine Gluconate Cloth  6 each Topical Daily   clopidogrel  75 mg Oral Daily   insulin aspart  0-24 Units Subcutaneous Q4H   lidocaine  1 patch Transdermal Daily   mouth rinse  15 mL Mouth Rinse BID   metoprolol tartrate  50 mg Oral BID   pantoprazole  40 mg Oral Daily   rosuvastatin  10 mg Oral QHS   varenicline  0.5 mg Oral Daily   Followed by   Derrill Memo ON 01/23/2021] varenicline   0.5 mg Oral BID   vitamin B-12  250 mcg Oral Daily   Continuous Infusions:  sodium chloride     lactated ringers Stopped (01/18/21 1516)   PRN Meds:.hydrALAZINE, ipratropium-albuterol, metoprolol tartrate, ondansetron (ZOFRAN) IV, QUEtiapine, sodium chloride flush, sodium chloride flush  General appearance: alert, cooperative, and no distress Heart: regular rate and rhythm Lungs: mildly dim in right base Abdomen: some distension, + BS, non tender Extremities: no edema Wound: incis healing well  Lab Results: CBC: Recent Labs    01/19/21 0322 01/20/21 0428  WBC 12.8* 12.6*  HGB 10.8* 10.6*  HCT 31.8* 32.9*  PLT 135* 157   BMET:  Recent Labs    01/20/21 0428 01/21/21 0646  NA 140 139  K 4.6 4.2  CL 104 107  CO2 24 23  GLUCOSE 168* 154*  BUN 26* 30*  CREATININE 2.03* 1.84*  CALCIUM 9.1 8.4*    CMET: Lab Results  Component Value Date   WBC 12.6 (H) 01/20/2021   HGB 10.6 (L) 01/20/2021   HCT 32.9 (L) 01/20/2021   PLT 157 01/20/2021   GLUCOSE 154 (H) 01/21/2021   CHOL 247 (H) 01/10/2021   TRIG 139  01/10/2021   HDL 39 (L) 01/10/2021   LDLCALC 180 (H) 01/10/2021   ALT 10 10/14/2019   AST 17 10/14/2019   NA 139 01/21/2021   K 4.2 01/21/2021   CL 107 01/21/2021   CREATININE 1.84 (H) 01/21/2021   BUN 30 (H) 01/21/2021   CO2 23 01/21/2021   TSH 1.248 01/20/2021   INR 1.2 01/17/2021   HGBA1C 9.1 (H) 01/17/2021      PT/INR: No results for input(s): LABPROT, INR in the last 72 hours. Radiology: No results found.   Assessment/Plan: S/P Procedure(s) (LRB): CORONARY ARTERY BYPASS GRAFTING (CABG) TIMES 3 , ON PUMP, USING LEFT INTERNAL MAMMARY ARTERY AND ENDOSCOPICALLY HARVESTED RIGHT GREATER SAPHENOUS VEIN (N/A) TRANSESOPHAGEAL ECHOCARDIOGRAM (TEE) (N/A) APPLICATION OF CELL SAVER ENDOVEIN HARVEST OF GREATER SAPHENOUS VEIN (Right)  1 afeb, VSS sBP 120's-150's, sinus brady 2 sats ok on 4 liters 3 UOP low but creat has peaked and now trending downm 1.84  today 4 CBG well controlled 5 passes swallow eval with no restrictions 6 MRI was cancelled, right hand sx appear resolved 7 will give MOM for constipation 8 cont therapies, SNF at d/c, routine pulm toilet 9 poss tx out of unit soon  [name] PA-C Pager [phone] 01/21/2021 8:17 AM   Agree with above Doing better today Creat improved Titrating meds Gentle diuresis today Will transfer to floor Placed order for case management  Lajuana Matte

## 2021-01-22 LAB — BASIC METABOLIC PANEL
Anion gap: 7 (ref 5–15)
BUN: 28 mg/dL — ABNORMAL HIGH (ref 8–23)
CO2: 24 mmol/L (ref 22–32)
Calcium: 8.3 mg/dL — ABNORMAL LOW (ref 8.9–10.3)
Chloride: 106 mmol/L (ref 98–111)
Creatinine, Ser: 1.69 mg/dL — ABNORMAL HIGH (ref 0.44–1.00)
GFR, Estimated: 30 mL/min — ABNORMAL LOW (ref >60)
Glucose, Bld: 124 mg/dL — ABNORMAL HIGH (ref 70–99)
Potassium: 4.2 mmol/L (ref 3.5–5.1)
Sodium: 137 mmol/L (ref 135–145)

## 2021-01-22 LAB — GLUCOSE, CAPILLARY
Glucose-Capillary: 113 mg/dL — ABNORMAL HIGH (ref 70–99)
Glucose-Capillary: 147 mg/dL — ABNORMAL HIGH (ref 70–99)
Glucose-Capillary: 133 mg/dL — ABNORMAL HIGH (ref 70–99)
Glucose-Capillary: 154 mg/dL — ABNORMAL HIGH (ref 70–99)
Glucose-Capillary: 118 mg/dL — ABNORMAL HIGH (ref 70–99)

## 2021-01-22 MED ORDER — INSULIN ASPART 100 UNIT/ML IJ SOLN
0.0000 [IU] | Freq: Three times a day (TID) | INTRAMUSCULAR | Status: DC
  Administered 2021-01-22 – 2021-01-23 (×3): 2 [IU] via SUBCUTANEOUS
  Administered 2021-01-23: 4 [IU] via SUBCUTANEOUS
  Administered 2021-01-23 – 2021-01-24 (×2): 2 [IU] via SUBCUTANEOUS
  Administered 2021-01-24: 8 [IU] via SUBCUTANEOUS
  Administered 2021-01-25: 2 [IU] via SUBCUTANEOUS
  Administered 2021-01-25: 4 [IU] via SUBCUTANEOUS
  Administered 2021-01-26 – 2021-01-27 (×3): 2 [IU] via SUBCUTANEOUS
  Administered 2021-01-27: 4 [IU] via SUBCUTANEOUS
  Administered 2021-01-27: 2 [IU] via SUBCUTANEOUS

## 2021-01-22 NOTE — Progress Notes (Signed)
5 Days Post-Op Procedure(s) (LRB): CORONARY ARTERY BYPASS GRAFTING (CABG) TIMES 3 , ON PUMP, USING LEFT INTERNAL MAMMARY ARTERY AND ENDOSCOPICALLY HARVESTED RIGHT GREATER SAPHENOUS VEIN (N/A) TRANSESOPHAGEAL ECHOCARDIOGRAM (TEE) (N/A) APPLICATION OF CELL SAVER ENDOVEIN HARVEST OF GREATER SAPHENOUS VEIN (Right) Subjective: No complaints.  Objective: Vital signs in last 24 hours: Temp:  [98.2 F (36.8 C)-98.4 F (36.9 C)] 98.2 F (36.8 C) (08/27 0831) Pulse Rate:  [58] 58 (08/26 2114) Cardiac Rhythm: Normal sinus rhythm (08/27 0730) Resp:  [12-24] 12 (08/27 0940) BP: (103-154)/(56-88) 133/56 (08/27 0940) SpO2:  [94 %-99 %] 98 % (08/27 0940)  Hemodynamic parameters for last 24 hours:    Intake/Output from previous day: 08/26 0701 - 08/27 0700 In: 360 [P.O.:360] Out: 1500 [Urine:1500] Intake/Output this shift: No intake/output data recorded.  General appearance: alert and cooperative Neurologic: intact Heart: regular rate and rhythm Lungs: clear to auscultation bilaterally Extremities: minimal edema Wound: incisions ok.  Lab Results: Recent Labs    01/20/21 0428  WBC 12.6*  HGB 10.6*  HCT 32.9*  PLT 157   BMET:  Recent Labs    01/21/21 0646 01/22/21 0019  NA 139 137  K 4.2 4.2  CL 107 106  CO2 23 24  GLUCOSE 154* 124*  BUN 30* 28*  CREATININE 1.84* 1.69*  CALCIUM 8.4* 8.3*    PT/INR: No results for input(s): LABPROT, INR in the last 72 hours. ABG    Component Value Date/Time   PHART 7.380 01/19/2021 0131   HCO3 23.6 01/19/2021 0131   TCO2 25 01/19/2021 0131   ACIDBASEDEF 1.0 01/19/2021 0131   O2SAT 98.0 01/19/2021 0131   CBG (last 3)  Recent Labs    01/21/21 2333 01/22/21 0349 01/22/21 0826  GLUCAP 125* 113* 147*    Assessment/Plan: S/P Procedure(s) (LRB): CORONARY ARTERY BYPASS GRAFTING (CABG) TIMES 3 , ON PUMP, USING LEFT INTERNAL MAMMARY ARTERY AND ENDOSCOPICALLY HARVESTED RIGHT GREATER SAPHENOUS VEIN (N/A) TRANSESOPHAGEAL  ECHOCARDIOGRAM (TEE) (N/A) APPLICATION OF CELL SAVER ENDOVEIN HARVEST OF GREATER SAPHENOUS VEIN (Right)  POD 5 Hemodynamically stable in sinus rhythm. HR 65 and reportedly dropped into 40's yesterday after Lopressor. Will hold that for now and may be able to resume at a low dose.    Acute postop rise in creatinine improving daily.   Volume excess: wt is about 20 lbs over preop if accurate. Continue gentle diuresis.  DM: glucose under good control on SSI. Resume oral agent when creat down further.  She is only walking short distances in room. PT seeing. Plan SNF at discharge.  Waiting on 4E bed.   LOS: 12 days    [name] 01/22/2021

## 2021-01-22 NOTE — Progress Notes (Signed)
Subjective:  Patient denies any chest pain or shortness of breath.  States slowly getting stronger, walked to the bathroom today.  Renal function slowly improving  Objective:  Vital Signs in the last 24 hours: Temp:  [98.2 F (36.8 C)-98.4 F (36.9 C)] 98.2 F (36.8 C) (08/27 0831) Pulse Rate:  [58] 58 (08/26 2114) Resp:  [12-24] 12 (08/27 0940) BP: (103-154)/(56-88) 133/56 (08/27 0940) SpO2:  [94 %-99 %] 98 % (08/27 0940)  Intake/Output from previous day: 08/26 0701 - 08/27 0700 In: 360 [P.O.:360] Out: 1500 [Urine:1500] Intake/Output from this shift: No intake/output data recorded.  Physical Exam: Neck: no adenopathy, no carotid bruit, no JVD, and supple, symmetrical, trachea midline Lungs: decreased breath sounds at bases Heart: regular rate and rhythm, S1, S2 normal, and soft systolic murmur noted.  No pericardial rub Abdomen: soft, non-tender; bowel sounds normal; no masses,  no organomegaly Extremities: extremities normal, atraumatic, no cyanosis or edema  Lab Results: Recent Labs    01/20/21 0428  WBC 12.6*  HGB 10.6*  PLT 157   Recent Labs    01/21/21 0646 01/22/21 0019  NA 139 137  K 4.2 4.2  CL 107 106  CO2 23 24  GLUCOSE 154* 124*  BUN 30* 28*  CREATININE 1.84* 1.69*   No results for input(s): TROPONINI in the last 72 hours.  Invalid input(s): CK, MB Hepatic Function Panel No results for input(s): PROT, ALBUMIN, AST, ALT, ALKPHOS, BILITOT, BILIDIR, IBILI in the last 72 hours. No results for input(s): CHOL in the last 72 hours. No results for input(s): PROTIME in the last 72 hours.  Imaging: No results found.  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  status post CABG 3, postop day5doing well Status post paroxysmal A. 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  Status post PCI to RCA in November  2015 Hypertension. Hyperlipidemia. Diabetes mellitus. History of CVA in the past Peripheral vascular disease, history of renal stenting and femoral popliteal bypass in the past. 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 Acute on Chronic kidney disease, stage IIIB,improved Tobacco abuse. Postop atelectasis improved. Plan Encouraged incentive spirometry. Increase ambulation as tolerated. Awaiting transfer to 4 E.  LOS: 12 days    [name] 01/22/2021, 12:11 PM

## 2021-01-23 LAB — BASIC METABOLIC PANEL
Anion gap: 9 (ref 5–15)
BUN: 21 mg/dL (ref 8–23)
CO2: 25 mmol/L (ref 22–32)
Calcium: 8.6 mg/dL — ABNORMAL LOW (ref 8.9–10.3)
Chloride: 103 mmol/L (ref 98–111)
Creatinine, Ser: 1.49 mg/dL — ABNORMAL HIGH (ref 0.44–1.00)
GFR, Estimated: 35 mL/min — ABNORMAL LOW (ref >60)
Glucose, Bld: 121 mg/dL — ABNORMAL HIGH (ref 70–99)
Potassium: 4.2 mmol/L (ref 3.5–5.1)
Sodium: 137 mmol/L (ref 135–145)

## 2021-01-23 LAB — GLUCOSE, CAPILLARY
Glucose-Capillary: 124 mg/dL — ABNORMAL HIGH (ref 70–99)
Glucose-Capillary: 144 mg/dL — ABNORMAL HIGH (ref 70–99)
Glucose-Capillary: 186 mg/dL — ABNORMAL HIGH (ref 70–99)
Glucose-Capillary: 172 mg/dL — ABNORMAL HIGH (ref 70–99)

## 2021-01-23 MED ORDER — ACETAMINOPHEN 500 MG PO TABS
1000.0000 mg | ORAL_TABLET | Freq: Four times a day (QID) | ORAL | Status: DC | PRN
  Administered 2021-01-23 – 2021-01-27 (×11): 1000 mg via ORAL
  Filled 2021-01-23 (×11): qty 2

## 2021-01-23 NOTE — Progress Notes (Addendum)
[address]       [place] [address]             [phone]      6 Days Post-Op Procedure(s) (LRB): CORONARY ARTERY BYPASS GRAFTING (CABG) TIMES 3 , ON PUMP, USING LEFT INTERNAL MAMMARY ARTERY AND ENDOSCOPICALLY HARVESTED RIGHT GREATER SAPHENOUS VEIN (N/A) TRANSESOPHAGEAL ECHOCARDIOGRAM (TEE) (N/A) APPLICATION OF CELL SAVER ENDOVEIN HARVEST OF GREATER SAPHENOUS VEIN (Right) Subjective: C/o gas, overall conts to feel better, some incis discomfort  Objective: Vital signs in last 24 hours: Temp:  [97.8 F (36.6 C)-98.7 F (37.1 C)] 97.8 F (36.6 C) (08/28 0302) Pulse Rate:  [65-71] 65 (08/28 0302) Cardiac Rhythm: Normal sinus rhythm (08/27 1900) Resp:  [12-20] 17 (08/28 0302) BP: (133-150)/(56-68) 139/68 (08/28 0302) SpO2:  [91 %-98 %] 91 % (08/28 0302) Weight:  [86.4 kg] 86.4 kg (08/28 0302)  Hemodynamic parameters for last 24 hours:    Intake/Output from previous day: 08/27 0701 - 08/28 0700 In: 366.8 [P.O.:360; I.V.:6.8] Out: 700 [Urine:700] Intake/Output this shift: No intake/output data recorded.  General appearance: alert, cooperative, and no distress Heart: regular rate and rhythm Lungs: min dim in bases Abdomen: benign Extremities: no edema Wound: incis healing well  Lab Results: No results for input(s): WBC, HGB, HCT, PLT in the last 72 hours. BMET:  Recent Labs    01/22/21 0019 01/23/21 0253  NA 137 137  K 4.2 4.2  CL 106 103  CO2 24 25  GLUCOSE 124* 121*  BUN 28* 21  CREATININE 1.69* 1.49*  CALCIUM 8.3* 8.6*    PT/INR: No results for input(s): LABPROT, INR in the last 72 hours. ABG    Component Value Date/Time   PHART 7.380 01/19/2021 0131   HCO3 23.6 01/19/2021 0131   TCO2 25 01/19/2021 0131   ACIDBASEDEF 1.0 01/19/2021 0131   O2SAT 98.0 01/19/2021 0131   CBG (last 3)  Recent Labs    01/22/21 1735 01/22/21 2057 01/23/21 0606  GLUCAP 154* 118* 124*    Meds Scheduled Meds:  amLODipine  10 mg Oral Daily    aspirin EC  81 mg Oral Daily   bisacodyl  10 mg Oral Daily   Or   bisacodyl  10 mg Rectal Daily   Chlorhexidine Gluconate Cloth  6 each Topical Daily   clopidogrel  75 mg Oral Daily   insulin aspart  0-24 Units Subcutaneous TID WC   mouth rinse  15 mL Mouth Rinse BID   pantoprazole  40 mg Oral Daily   rosuvastatin  10 mg Oral QHS   varenicline  0.5 mg Oral BID   vitamin B-12  250 mcg Oral Daily   Continuous Infusions:  sodium chloride     PRN Meds:.hydrALAZINE, ipratropium-albuterol, magnesium hydroxide, ondansetron (ZOFRAN) IV, QUEtiapine, sodium chloride flush, sodium chloride flush  Xrays No results found.  Assessment/Plan: S/P Procedure(s) (LRB): CORONARY ARTERY BYPASS GRAFTING (CABG) TIMES 3 , ON PUMP, USING LEFT INTERNAL MAMMARY ARTERY AND ENDOSCOPICALLY HARVESTED RIGHT GREATER SAPHENOUS VEIN (N/A) TRANSESOPHAGEAL ECHOCARDIOGRAM (TEE) (N/A) APPLICATION OF CELL SAVER ENDOVEIN HARVEST OF GREATER SAPHENOUS VEIN (Right)  1 afeb, VSS, some brady, off beta blocker 2 sats good on 2 liters 3 UOP appears good- amt not calculated but multiple voids, weight increased if accurate- could consider diuretic but she appears pretty euvolemic and spontaneous output is good 4 creat conts to trend down from peak of 2.03---- currently 1.49 5 sugars ok on SSI- resume amaryl when appetite better and creat  further down 6 excellent overall progress, SNF at d/c 7 on DAPT 8 on crestor   LOS: 13 days    [name] PA-C Pager [phone] 01/23/2021    Chart reviewed, patient examined, agree with above. She looks good overall. Wt is recorded at 27 lbs over preop wt but that can't be accurate. She has no peripheral edema. Renal function continues to improve. Baseline preop was 0.8.

## 2021-01-23 NOTE — Plan of Care (Signed)
  Problem: Education: Goal: Knowledge of General Education information will improve Description Including pain rating scale, medication(s)/side effects and non-pharmacologic comfort measures Outcome: Progressing   

## 2021-01-23 NOTE — Progress Notes (Signed)
Chaplain responded to page for delivery and explanation of AD.  Chaplain met with pt and her daughter.  Chaplain explained document and advised it would be Monday before the document could be notarized.  Chaplain advised daughter to help her mother fill in the blanks and let her nurse the document is ready for notary.    Pt also requested DNR.  Advised pt and daughter they would need to speak with her primary care physician about that.     [place]

## 2021-01-23 NOTE — Progress Notes (Signed)
Subjective:  Doing well denies any anginal chest pain or shortness of breath states gradually feeling stronger.  Renal function is gradually improving  Objective:  Vital Signs in the last 24 hours: Temp:  [97.8 F (36.6 C)-98.7 F (37.1 C)] 97.8 F (36.6 C) (08/28 0844) Pulse Rate:  [65-71] 71 (08/28 0844) Resp:  [15-20] 15 (08/28 0844) BP: (139-156)/(61-68) 156/67 (08/28 0844) SpO2:  [91 %-98 %] 98 % (08/28 0844) Weight:  [86.4 kg] 86.4 kg (08/28 0302)  Intake/Output from previous day: 08/27 0701 - 08/28 0700 In: 366.8 [P.O.:360; I.V.:6.8] Out: 700 [Urine:700] Intake/Output from this shift: No intake/output data recorded.  Physical Exam: Neck: no adenopathy, no carotid bruit, no JVD, and supple, symmetrical, trachea midline Lungs: clear to auscultation bilaterally Heart: regular rate and rhythm, S1, S2 normal, and soft systolic murmur noted no S3 gallop or pericardial rub Abdomen: soft, non-tender; bowel sounds normal; no masses,  no organomegaly Extremities: extremities normal, atraumatic, no cyanosis or edema  Lab Results: No results for input(s): WBC, HGB, PLT in the last 72 hours. Recent Labs    01/22/21 0019 01/23/21 0253  NA 137 137  K 4.2 4.2  CL 106 103  CO2 24 25  GLUCOSE 124* 121*  BUN 28* 21  CREATININE 1.69* 1.49*   No results for input(s): TROPONINI in the last 72 hours.  Invalid input(s): CK, MB Hepatic Function Panel No results for input(s): PROT, ALBUMIN, AST, ALT, ALKPHOS, BILITOT, BILIDIR, IBILI in the last 72 hours. No results for input(s): CHOL in the last 72 hours. No results for input(s): PROTIME in the last 72 hours.  Imaging: No results found.  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  status post CABG 3, postop day6 doing well Status post paroxysmal A. 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  Status post  PCI to RCA in November 2015 Hypertension. Hyperlipidemia. Diabetes mellitus. History of CVA in the past Peripheral vascular disease, history of renal stenting and femoral popliteal bypass in the past. 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 Acute on Chronic kidney disease, stage IIIB,improved Tobacco abuse. Postop atelectasis improved. Plan Continue present management Will add low-dose ARB tomorrow if renal function continues to improve  LOS: 13 days    [name] 01/23/2021, 9:54 AM

## 2021-01-24 LAB — BASIC METABOLIC PANEL
Anion gap: 7 (ref 5–15)
BUN: 18 mg/dL (ref 8–23)
CO2: 26 mmol/L (ref 22–32)
Calcium: 8.4 mg/dL — ABNORMAL LOW (ref 8.9–10.3)
Chloride: 106 mmol/L (ref 98–111)
Creatinine, Ser: 1.38 mg/dL — ABNORMAL HIGH (ref 0.44–1.00)
GFR, Estimated: 38 mL/min — ABNORMAL LOW (ref >60)
Glucose, Bld: 140 mg/dL — ABNORMAL HIGH (ref 70–99)
Potassium: 4.1 mmol/L (ref 3.5–5.1)
Sodium: 139 mmol/L (ref 135–145)

## 2021-01-24 LAB — GLUCOSE, CAPILLARY
Glucose-Capillary: 150 mg/dL — ABNORMAL HIGH (ref 70–99)
Glucose-Capillary: 212 mg/dL — ABNORMAL HIGH (ref 70–99)
Glucose-Capillary: 121 mg/dL — ABNORMAL HIGH (ref 70–99)
Glucose-Capillary: 175 mg/dL — ABNORMAL HIGH (ref 70–99)

## 2021-01-24 NOTE — Progress Notes (Addendum)
[address]       [place] [address]             [phone]      7 Days Post-Op Procedure(s) (LRB): CORONARY ARTERY BYPASS GRAFTING (CABG) TIMES 3 , ON PUMP, USING LEFT INTERNAL MAMMARY ARTERY AND ENDOSCOPICALLY HARVESTED RIGHT GREATER SAPHENOUS VEIN (N/A) TRANSESOPHAGEAL ECHOCARDIOGRAM (TEE) (N/A) APPLICATION OF CELL SAVER ENDOVEIN HARVEST OF GREATER SAPHENOUS VEIN (Right) Subjective: Conts to feel better, some chest soreness   Objective: Vital signs in last 24 hours: Temp:  [97.7 F (36.5 C)-98.8 F (37.1 C)] 97.7 F (36.5 C) (08/29 0437) Pulse Rate:  [61-72] 61 (08/29 0028) Cardiac Rhythm: Normal sinus rhythm (08/28 1919) Resp:  [15-19] 18 (08/29 0437) BP: (136-156)/(56-70) 145/62 (08/29 0437) SpO2:  [97 %-98 %] 98 % (08/29 0028) Weight:  [88.9 kg] 88.9 kg (08/29 0333)  Hemodynamic parameters for last 24 hours:    Intake/Output from previous day: No intake/output data recorded. Intake/Output this shift: No intake/output data recorded.  General appearance: alert, cooperative, and no distress Heart: regular rate and rhythm Lungs: mildly dim in bases Abdomen: benign Extremities: no edema Wound: incis healing well  Lab Results: No results for input(s): WBC, HGB, HCT, PLT in the last 72 hours. BMET:  Recent Labs    01/23/21 0253 01/24/21 0151  NA 137 139  K 4.2 4.1  CL 103 106  CO2 25 26  GLUCOSE 121* 140*  BUN 21 18  CREATININE 1.49* 1.38*  CALCIUM 8.6* 8.4*    PT/INR: No results for input(s): LABPROT, INR in the last 72 hours. ABG    Component Value Date/Time   PHART 7.380 01/19/2021 0131   HCO3 23.6 01/19/2021 0131   TCO2 25 01/19/2021 0131   ACIDBASEDEF 1.0 01/19/2021 0131   O2SAT 98.0 01/19/2021 0131   CBG (last 3)  Recent Labs    01/23/21 1108 01/23/21 1557 01/23/21 1959  GLUCAP 144* 186* 172*    Meds Scheduled Meds:  amLODipine  10 mg Oral Daily   aspirin EC  81 mg Oral Daily   bisacodyl  10 mg Oral Daily    Or   bisacodyl  10 mg Rectal Daily   clopidogrel  75 mg Oral Daily   insulin aspart  0-24 Units Subcutaneous TID WC   mouth rinse  15 mL Mouth Rinse BID   pantoprazole  40 mg Oral Daily   rosuvastatin  10 mg Oral QHS   varenicline  0.5 mg Oral BID   vitamin B-12  250 mcg Oral Daily   Continuous Infusions:  sodium chloride     PRN Meds:.acetaminophen, hydrALAZINE, ipratropium-albuterol, magnesium hydroxide, ondansetron (ZOFRAN) IV, QUEtiapine, sodium chloride flush, sodium chloride flush  Xrays No results found.  Assessment/Plan: S/P Procedure(s) (LRB): CORONARY ARTERY BYPASS GRAFTING (CABG) TIMES 3 , ON PUMP, USING LEFT INTERNAL MAMMARY ARTERY AND ENDOSCOPICALLY HARVESTED RIGHT GREATER SAPHENOUS VEIN (N/A) TRANSESOPHAGEAL ECHOCARDIOGRAM (TEE) (N/A) APPLICATION OF CELL SAVER ENDOVEIN HARVEST OF GREATER SAPHENOUS VEIN (Right)  1 afeb VSS, s BP 130's-150's, sinus rhythm 2 sats good on 2 liters 3 UOP/weights do not not appear accurate 4 creat conts to improve at 1.38 5 BS adeq control, prob can resume amaryl in next day or so 6 on DAPT 7 should be ready for SNF soon 9 wean O2 off, cont rehab modalities  LOS: 14 days    Laura Giovanni PA-C Pager [phone] 01/24/2021    Agree with above Continue PT Dispo planning  [name] [name]

## 2021-01-24 NOTE — Progress Notes (Signed)
Subjective:  Patient denies any chest pain but complains of feeling tired and short of breath with minimal exertion walking to the bathroom.  Objective:  Vital Signs in the last 24 hours: Temp:  [97.7 F (36.5 C)-98.8 F (37.1 C)] 98 F (36.7 C) (08/29 0743) Pulse Rate:  [61-69] 66 (08/29 0743) Resp:  [18-20] 20 (08/29 0743) BP: (136-145)/(56-63) 143/59 (08/29 0743) SpO2:  [97 %-100 %] 100 % (08/29 0743) Weight:  [88.9 kg] 88.9 kg (08/29 0333)  Intake/Output from previous day: No intake/output data recorded. Intake/Output from this shift: No intake/output data recorded.  Physical Exam: Neck: no adenopathy, no carotid bruit, no JVD, and supple, symmetrical, trachea midline Lungs: clear to auscultation bilaterally Heart: regular rate and rhythm, S1, S2 normal, and soft systolic murmur noted Abdomen: soft, non-tender; bowel sounds normal; no masses,  no organomegaly Extremities: extremities normal, atraumatic, no cyanosis or edema  Lab Results: No results for input(s): WBC, HGB, PLT in the last 72 hours. Recent Labs    01/23/21 0253 01/24/21 0151  NA 137 139  K 4.2 4.1  CL 103 106  CO2 25 26  GLUCOSE 121* 140*  BUN 21 18  CREATININE 1.49* 1.38*   No results for input(s): TROPONINI in the last 72 hours.  Invalid input(s): CK, MB Hepatic Function Panel No results for input(s): PROT, ALBUMIN, AST, ALT, ALKPHOS, BILITOT, BILIDIR, IBILI in the last 72 hours. No results for input(s): CHOL in the last 72 hours. No results for input(s): PROTIME in the last 72 hours.  Imaging: No results found.  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  status post CABG 3, postop day6 doing well Status post paroxysmal A. 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  Status post PCI to RCA in November 2015 Hypertension. Hyperlipidemia. Diabetes mellitus. History  of CVA in the past Peripheral vascular disease, history of renal stenting and femoral popliteal bypass in the past. 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 Acute on Chronic kidney disease, stage IIIB,improved Tobacco abuse. Postop atelectasis improved. Deconditioning Plan Start losartan 12.5 mg daily Increase ambulation as tolerated   LOS: 14 days    [name] 01/24/2021, 11:19 AM

## 2021-01-24 NOTE — Progress Notes (Signed)
Physical Therapy Treatment Patient Details Name: [name] MRN: [id number] DOB: Dec 24, 1940 Today's Date: 01/24/2021    History of Present Illness Pt is an 80 y/o female admitted 8/14 with chest pain and SOB. She is s/p L heart cath with diagnosis of multivessel CAD and pt s/p CABG x3 8/22.  Neurology consulted 8/24 due to numbness and tingling of R hand.  PMHx: AAA, DM, Glaucoma, HTN, MI, PVD, Stroke, daily smoker.    PT Comments    Pt making good progress today.  She reports she is not feeling as well today, but was still able to increase gait to 68' with RW and transitioned from +2 mod A to min A of 1 with chair follow. She did fatigue easily. All VSS. Continue plan of care    Follow Up Recommendations  SNF;Supervision/Assistance - 24 hour     Equipment Recommendations  3in1 (PT)    Recommendations for Other Services       Precautions / Restrictions Precautions Precautions: Fall;Sternal Precaution Booklet Issued: Yes (comment) Precaution Comments: sternal handout    Mobility  Bed Mobility Overal bed mobility: Needs Assistance Bed Mobility: Rolling;Sidelying to Sit Rolling: Min assist Sidelying to sit: Min assist       General bed mobility comments: Requiring min A for truncal support to maintain sternal precautions    Transfers Overall transfer level: Needs assistance   Transfers: Sit to/from Stand Sit to Stand: Min assist         General transfer comment: cues for hands on knees to stand and sit and min A rise and to control descent when sitting  Ambulation/Gait Ambulation/Gait assistance: Min assist;+2 safety/equipment Gait Distance (Feet): 38 Feet Assistive device: Rolling walker (2 wheeled) Gait Pattern/deviations: Step-to pattern;Decreased stride length;Trunk flexed Gait velocity: decreased   General Gait Details: Min A to steady ; chair follow due to easily fatigued; cues for RW proximity   Stairs             Wheelchair Mobility     Modified Rankin (Stroke Patients Only)       Balance Overall balance assessment: Needs assistance Sitting-balance support: No upper extremity supported;Feet supported Sitting balance-Leahy Scale: Good     Standing balance support: Bilateral upper extremity supported;During functional activity Standing balance-Leahy Scale: Poor Standing balance comment: Requires RW                            Cognition Arousal/Alertness: Awake/alert Behavior During Therapy: WFL for tasks assessed/performed Overall Cognitive Status: Within Functional Limits for tasks assessed                                        Exercises      General Comments General comments (skin integrity, edema, etc.): Pt on 2 L [place] with SpO2 98% throughout session. HR 80's--90's.  Provided sternal precautions handout and educated on precautions      Pertinent Vitals/Pain Pain Assessment: Faces Faces Pain Scale: Hurts a little bit Pain Location: surgical site Pain Descriptors / Indicators: Sore Pain Intervention(s): Limited activity within patient's tolerance;Monitored during session;Repositioned    Home Living                      Prior Function            PT Goals (current goals can now be found in the care  plan section) Acute Rehab PT Goals Patient Stated Goal: to get stronger PT Goal Formulation: With patient Time For Goal Achievement: 02/02/21 Potential to Achieve Goals: Good Progress towards PT goals: Progressing toward goals    Frequency    Min 3X/week      PT Plan Current plan remains appropriate    Co-evaluation              AM-PAC PT "6 Clicks" Mobility   Outcome Measure  Help needed turning from your back to your side while in a flat bed without using bedrails?: A Little Help needed moving from lying on your back to sitting on the side of a flat bed without using bedrails?: A Little Help needed moving to and from a bed to a chair (including  a wheelchair)?: A Little Help needed standing up from a chair using your arms (e.g., wheelchair or bedside chair)?: A Little Help needed to walk in hospital room?: A Little Help needed climbing 3-5 steps with a railing? : A Lot 6 Click Score: 17    End of Session Equipment Utilized During Treatment: Oxygen Activity Tolerance: Patient tolerated treatment well Patient left: in chair;with call bell/phone within reach Nurse Communication: Mobility status PT Visit Diagnosis: Other abnormalities of gait and mobility (R26.89);Muscle weakness (generalized) (M62.81);Difficulty in walking, not elsewhere classified (R26.2);Other symptoms and signs involving the nervous system (R29.898)     Time: 3016-0109 PT Time Calculation (min) (ACUTE ONLY): 24 min  Charges:  $Gait Training: 8-22 mins $Therapeutic Activity: 8-22 mins                     Abran Richard, PT Acute Rehab Services Pager [phone] [facility] 01/24/2021, 2:38 PM

## 2021-01-24 NOTE — Care Management Important Message (Signed)
Important Message  Patient Details  Name: [name] MRN: [id number] Date of Birth: 03/05/1941   Medicare Important Message Given:  Yes     Shelda Altes 01/24/2021, 11:09 AM

## 2021-01-24 NOTE — TOC Progression Note (Signed)
Transition of Care [facility]) - Progression Note    Patient Details  Name: [name] MRN: [id number] Date of Birth: Sep 24, 1940  Transition of Care [facility]) CM/SW [place], [place] Phone Number: 01/24/2021, 2:04 PM  Clinical Narrative:     Received call from patient's daughter- she requested CSW send referrals to [facility] and [facility]. CSW sent referrals and others in the [place] area as requested.  CSW will continue to follow and assist with discharge planning.  Thurmond Butts, MSW, LCSW Clinical Social Worker    Expected Discharge Plan: Skilled Nursing Facility Barriers to Discharge: Continued Medical Work up  Expected Discharge Plan and Services Expected Discharge Plan: [place] In-house Referral: Clinical Social Work     Living arrangements for the past 2 months: Apartment                                       Social Determinants of Health (SDOH) Interventions    Readmission Risk Interventions No flowsheet data found.

## 2021-01-25 ENCOUNTER — Inpatient Hospital Stay (HOSPITAL_COMMUNITY): Payer: Medicare HMO

## 2021-01-25 LAB — GLUCOSE, CAPILLARY
Glucose-Capillary: 130 mg/dL — ABNORMAL HIGH (ref 70–99)
Glucose-Capillary: 177 mg/dL — ABNORMAL HIGH (ref 70–99)
Glucose-Capillary: 121 mg/dL — ABNORMAL HIGH (ref 70–99)
Glucose-Capillary: 112 mg/dL — ABNORMAL HIGH (ref 70–99)

## 2021-01-25 MED ORDER — AMLODIPINE BESYLATE 5 MG PO TABS
5.0000 mg | ORAL_TABLET | Freq: Every day | ORAL | Status: DC
  Administered 2021-01-26 – 2021-01-27 (×2): 5 mg via ORAL
  Filled 2021-01-25 (×2): qty 1

## 2021-01-25 MED ORDER — ISOSORBIDE MONONITRATE ER 30 MG PO TB24
30.0000 mg | ORAL_TABLET | Freq: Every day | ORAL | Status: DC
Start: 2021-01-25 — End: 2021-01-28
  Administered 2021-01-25 – 2021-01-27 (×3): 30 mg via ORAL
  Filled 2021-01-25 (×3): qty 1

## 2021-01-25 MED ORDER — LOSARTAN POTASSIUM 25 MG PO TABS
25.0000 mg | ORAL_TABLET | Freq: Every day | ORAL | Status: DC
Start: 2021-01-25 — End: 2021-01-28
  Administered 2021-01-25 – 2021-01-27 (×3): 25 mg via ORAL
  Filled 2021-01-25 (×3): qty 1

## 2021-01-25 NOTE — TOC Progression Note (Signed)
Transition of Care [facility]) - Progression Note    Patient Details  Name: [name] MRN: [id number] Date of Birth: 14-Feb-1941  Transition of Care [facility]) CM/SW [place], [place] Phone Number: 01/25/2021, 12:32 PM  Clinical Narrative:     Patient;d daughter has selected [facility] has contacted [facility]- waiting for SNF to confirm bed offer.   Thurmond Butts, MSW, LCSW Clinical Social Worker    Expected Discharge Plan: Skilled Nursing Facility Barriers to Discharge: Continued Medical Work up  Expected Discharge Plan and Services Expected Discharge Plan: [place] In-house Referral: Clinical Social Work     Living arrangements for the past 2 months: Apartment                                       Social Determinants of Health (SDOH) Interventions    Readmission Risk Interventions No flowsheet data found.

## 2021-01-25 NOTE — Progress Notes (Signed)
Physical Therapy Treatment Patient Details Name: [name] MRN: [id number] DOB: 02/18/1941 Today's Date: 01/25/2021    History of Present Illness Pt is an 80 y/o female admitted 8/14 with chest pain and SOB. She is s/p L heart cath with diagnosis of multivessel CAD and pt s/p CABG x3 8/22.  Neurology consulted 8/24 due to numbness and tingling of L hand.  PMHx: AAA, DM, Glaucoma, HTN, MI, PVD, Stroke, daily smoker.    PT Comments    Pt able to increase gait distance today with stable vitals.  She did require increased assist to stand once fatigued.  Requires frequent cues for sternal precautions.  Pt with loss of control of urination upon sitting - reports this has never happened to her - notified RN.  Continue to progress as able.     Follow Up Recommendations  SNF;Supervision/Assistance - 24 hour     Equipment Recommendations  3in1 (PT)    Recommendations for Other Services       Precautions / Restrictions Precautions Precautions: Fall;Sternal Precaution Booklet Issued: Yes (comment) Precaution Comments: sternal handout Restrictions Weight Bearing Restrictions: No    Mobility  Bed Mobility Overal bed mobility: Needs Assistance Bed Mobility: Sit to Supine     Supine to sit: Min assist Sit to supine: Supervision   General bed mobility comments: Min assiste with HOB elevated; cues for sternal precautions when scooting forward    Transfers Overall transfer level: Needs assistance Equipment used: Rolling walker (2 wheeled) Transfers: Sit to/from Stand Sit to Stand: Min assist;Mod assist         General transfer comment: Frequent cues for hand placement on knees with sternal precautions, use of momentum to rise.  Stood from elevated bed with min A.  Stood from chair with mod A and multiple attempts.  Ambulation/Gait Ambulation/Gait assistance: Min assist Gait Distance (Feet): 80 Feet Assistive device: Rolling walker (2 wheeled) Gait Pattern/deviations:  Step-to pattern;Decreased stride length;Trunk flexed Gait velocity: decreased   General Gait Details: Min A to steady with cues for RW proximity; did fatigue easily; reports feels heart racing but HR 80's-90s   Stairs             Wheelchair Mobility    Modified Rankin (Stroke Patients Only)       Balance Overall balance assessment: Needs assistance Sitting-balance support: No upper extremity supported;Feet supported Sitting balance-Leahy Scale: Good     Standing balance support: Bilateral upper extremity supported;During functional activity;Single extremity supported Standing balance-Leahy Scale: Poor Standing balance comment: RW to ambulate, single UE support when cleaning after ADLs, min A at times                            Cognition Arousal/Alertness: Awake/alert Behavior During Therapy: WFL for tasks assessed/performed Overall Cognitive Status: Impaired/Different from baseline Area of Impairment: Safety/judgement;Problem solving;Awareness                         Safety/Judgement: Decreased awareness of safety;Decreased awareness of deficits Awareness: Emergent Problem Solving: Slow processing;Decreased initiation;Requires verbal cues General Comments: Requiring increased time; repeated cues for sternal precautions      Exercises      General Comments General comments (skin integrity, edema, etc.): Pt on 2 L O2 with all VSS during session.  Upon return to room, pt denied need to use bathroom but when sat in chair "lost control of water."  Pt with no control over urination -  reports has not ever happened to her before.  Notified RN.  Assisted pt with cleaning      Pertinent Vitals/Pain Pain Assessment: No/denies pain Faces Pain Scale: Hurts a little bit Pain Location: generalized with movement Pain Descriptors / Indicators: Sore Pain Intervention(s): Monitored during session    Home Living                      Prior  Function               Frequency    Min 3X/week      PT Plan Current plan remains appropriate    Co-evaluation              AM-PAC PT "6 Clicks" Mobility   Outcome Measure  Help needed turning from your back to your side while in a flat bed without using bedrails?: A Little Help needed moving from lying on your back to sitting on the side of a flat bed without using bedrails?: A Little Help needed moving to and from a bed to a chair (including a wheelchair)?: A Little Help needed standing up from a chair using your arms (e.g., wheelchair or bedside chair)?: A Lot Help needed to walk in hospital room?: A Little Help needed climbing 3-5 steps with a railing? : A Lot 6 Click Score: 16    End of Session Equipment Utilized During Treatment: Gait belt;Oxygen Activity Tolerance: Patient tolerated treatment well Patient left: in chair;with call bell/phone within reach;with family/visitor present Nurse Communication: Mobility status PT Visit Diagnosis: Other abnormalities of gait and mobility (R26.89);Muscle weakness (generalized) (M62.81);Difficulty in walking, not elsewhere classified (R26.2);Other symptoms and signs involving the nervous system (R29.898)     Time: 1308-6578 PT Time Calculation (min) (ACUTE ONLY): 30 min  Charges:  $Gait Training: 8-22 mins $Therapeutic Activity: 8-22 mins                     Abran Richard, PT Acute Rehab Services Pager [phone] [facility] 01/25/2021, 1:37 PM

## 2021-01-25 NOTE — Progress Notes (Addendum)
      [address]       [place] [address]             [phone]      8 Days Post-Op Procedure(s) (LRB): CORONARY ARTERY BYPASS GRAFTING (CABG) TIMES 3 , ON PUMP, USING LEFT INTERNAL MAMMARY ARTERY AND ENDOSCOPICALLY HARVESTED RIGHT GREATER SAPHENOUS VEIN (N/A) TRANSESOPHAGEAL ECHOCARDIOGRAM (TEE) (N/A) APPLICATION OF CELL SAVER ENDOVEIN HARVEST OF GREATER SAPHENOUS VEIN (Right)  Subjective:  Patient doing okay.  She does have some mild swelling in her left hand.  The patient also notes pain along her left shoulder.  She has moved her bowels and continues to participate in PT  Objective: Vital signs in last 24 hours: Temp:  [97.6 F (36.4 C)-98.2 F (36.8 C)] 98.1 F (36.7 C) (08/30 0420) Pulse Rate:  [65-78] 71 (08/30 0420) Cardiac Rhythm: Normal sinus rhythm (08/29 1930) Resp:  [15-20] 15 (08/30 0420) BP: (140-172)/(59-91) 140/91 (08/30 0420) SpO2:  [92 %-100 %] 100 % (08/30 0420) Weight:  [80.7 kg] 80.7 kg (08/30 0420)  Intake/Output from previous day: 08/29 0701 - 08/30 0700 In: 240 [P.O.:240] Out: -    General appearance: alert, cooperative, and no distress Heart: regular rate and rhythm Lungs: diminished breath sounds bibasilar Abdomen: soft, non-tender; bowel sounds normal; no masses,  no organomegaly Extremities: edema trace Wound: clean and dry  Lab Results: No results for input(s): WBC, HGB, HCT, PLT in the last 72 hours. BMET:  Recent Labs    01/23/21 0253 01/24/21 0151  NA 137 139  K 4.2 4.1  CL 103 106  CO2 25 26  GLUCOSE 121* 140*  BUN 21 18  CREATININE 1.49* 1.38*  CALCIUM 8.6* 8.4*    PT/INR: No results for input(s): LABPROT, INR in the last 72 hours. ABG    Component Value Date/Time   PHART 7.380 01/19/2021 0131   HCO3 23.6 01/19/2021 0131   TCO2 25 01/19/2021 0131   ACIDBASEDEF 1.0 01/19/2021 0131   O2SAT 98.0 01/19/2021 0131   CBG (last 3)  Recent Labs    01/24/21 1611 01/24/21 2058 01/25/21 0557   GLUCAP 121* 175* 130*    Assessment/Plan: S/P Procedure(s) (LRB): CORONARY ARTERY BYPASS GRAFTING (CABG) TIMES 3 , ON PUMP, USING LEFT INTERNAL MAMMARY ARTERY AND ENDOSCOPICALLY HARVESTED RIGHT GREATER SAPHENOUS VEIN (N/A) TRANSESOPHAGEAL ECHOCARDIOGRAM (TEE) (N/A) APPLICATION OF CELL SAVER ENDOVEIN HARVEST OF GREATER SAPHENOUS VEIN (Right)  CV- Sinus Bradycardia, + HTN- continue Norvasc, will restart home Imdur at reduced dose, no BB due to bradycardia Pulm- weaning oxygen as tolerated, diminished on exam, some shortness of breath, will repeat CXR today to assess for pleural effusions Renal- AKI, creatinine has improved and remained stable, however no labs  today, may benefit from restart on Aldactone CBGs- remain controlled Deconditioning- awaiting SNF placement Dispo- patient stable, will repeat CXR to ensure no large effusions are present inhibiting ability to get off oxygen, continue PT/OT, will see how BP does today, can restart Aldactone if needed  LOS: 15 days    Ellwood Handler, PA-C 01/25/2021   Agree with above.  Overall doing well. Dispo planning.  Elford Evilsizer Bary Leriche

## 2021-01-25 NOTE — Progress Notes (Signed)
Occupational Therapy Treatment Patient Details Name: [name] MRN: [id number] DOB: 05/15/1941 Today's Date: 01/25/2021    History of present illness Pt is an 80 y/o female admitted 8/14 with chest pain and SOB. She is s/p L heart cath with diagnosis of multivessel CAD and pt s/p CABG x3 8/22.  Neurology consulted 8/24 due to numbness and tingling of R hand.  PMHx: AAA, DM, Glaucoma, HTN, MI, PVD, Stroke, daily smoker.   OT comments  Session focused on OOB ADLs with pt progressing well towards OT goals. Pt remains limited by decreased endurance, L sided weakness and L hand edema. Pt also endorses her mind feeling "off key" this AM. Pt overall Setup for UB ADLs, Min A for LB ADLs and min guard for mobility/transfers using RW. Continue to recommend SNF rehab at [place].   SpO2 87% on RA with ADLs, 89-91% on 1.5 L O2 with activity   Follow Up Recommendations  SNF;Supervision - Intermittent    Equipment Recommendations  Other (comment) (TBD at next venue)    Recommendations for Other Services      Precautions / Restrictions Precautions Precautions: Fall;Sternal Precaution Booklet Issued: Yes (comment) Precaution Comments: sternal handout Restrictions Weight Bearing Restrictions: No       Mobility Bed Mobility Overal bed mobility: Needs Assistance Bed Mobility: Sit to Supine       Sit to supine: Supervision   General bed mobility comments: no physical assist needed to get LEs or trunk back into bed    Transfers Overall transfer level: Needs assistance Equipment used: Rolling walker (2 wheeled) Transfers: Sit to/from Stand Sit to Stand: Min guard         General transfer comment: cues for hand placement, pt adamant on attempting task without assist - requires increased time/trials but able to complete without physical assist    Balance Overall balance assessment: Needs assistance Sitting-balance support: No upper extremity supported;Feet supported Sitting  balance-Leahy Scale: Good     Standing balance support: Bilateral upper extremity supported;During functional activity Standing balance-Leahy Scale: Poor Standing balance comment: reliant on UE support in standing                           ADL either performed or assessed with clinical judgement   ADL Overall ADL's : Needs assistance/impaired     Grooming: Set up;Sitting;Wash/dry face;Applying deodorant   Upper Body Bathing: Set up;Sitting   Lower Body Bathing: Minimal assistance;Sit to/from stand Lower Body Bathing Details (indicate cue type and reason): assist to wash/dry B feet Upper Body Dressing : Set up;Sitting   Lower Body Dressing: Moderate assistance;Sit to/from stand Lower Body Dressing Details (indicate cue type and reason): Overall Mod A, fatigued by end of ADLs  - assist to doff/don clean socks             Functional mobility during ADLs: Min guard;Rolling walker;Cueing for sequencing;Cueing for safety General ADL Comments: Pt limited by L sided weakness and progressive fatigue during tasks. SPo2 87% on RA with ADLs seated at sink, 89-91% on 1.5 L O2     Vision   Vision Assessment?: No apparent visual deficits   Perception     Praxis      Cognition Arousal/Alertness: Awake/alert Behavior During Therapy: WFL for tasks assessed/performed Overall Cognitive Status: Impaired/Different from baseline Area of Impairment: Safety/judgement;Problem solving;Awareness  Safety/Judgement: Decreased awareness of safety;Decreased awareness of deficits Awareness: Emergent Problem Solving: Slow processing;Decreased initiation;Requires verbal cues General Comments: Pt WFL for basic tasks though does endorse feeling "off key", as if mind is a couple of steps behind on what needs to be done. Cueing for precautions and increased time to follow directions        Exercises     Shoulder Instructions       General Comments On  1.5 L O2, removed for ADLs with SpO2 on RA and 89-91% on 1.5 L O2    Pertinent Vitals/ Pain       Pain Assessment: Faces Faces Pain Scale: Hurts a little bit Pain Location: generalized with movement Pain Descriptors / Indicators: Sore Pain Intervention(s): Monitored during session  Home Living                                          Prior Functioning/Environment              Frequency  Min 2X/week        Progress Toward Goals  OT Goals(current goals can now be found in the care plan section)  Progress towards OT goals: Progressing toward goals  Acute Rehab OT Goals Patient Stated Goal: to get stronger, be able to go home independently OT Goal Formulation: With patient Time For Goal Achievement: 02/03/21 Potential to Achieve Goals: Good ADL Goals Pt Will Perform Grooming: with supervision;standing Pt Will Perform Upper Body Dressing: with supervision;sitting Pt Will Perform Lower Body Dressing: sit to/from stand;with min assist;with adaptive equipment Pt Will Transfer to Toilet: with min assist;ambulating;bedside commode Pt/caregiver will Perform Home Exercise Program: Increased strength;With written HEP provided;Both right and left upper extremity Additional ADL Goal #1: Pt will recall and adhere to sternal precautions with no more than supervision during ADL routine.  Plan Discharge plan remains appropriate    Co-evaluation                 AM-PAC OT "6 Clicks" Daily Activity     Outcome Measure   Help from another person eating meals?: A Little Help from another person taking care of personal grooming?: A Little Help from another person toileting, which includes using toliet, bedpan, or urinal?: A Lot Help from another person bathing (including washing, rinsing, drying)?: A Little Help from another person to put on and taking off regular upper body clothing?: A Little Help from another person to put on and taking off regular lower body  clothing?: A Lot 6 Click Score: 16    End of Session Equipment Utilized During Treatment: Rolling walker;Oxygen  OT Visit Diagnosis: Other abnormalities of gait and mobility (R26.89);Muscle weakness (generalized) (M62.81);Pain;Other symptoms and signs involving cognitive function   Activity Tolerance Patient tolerated treatment well   Patient Left in bed;with call bell/phone within reach;with bed alarm set   Nurse Communication Mobility status        Time: 1155-2080 OT Time Calculation (min): 45 min  Charges: OT General Charges $OT Visit: 1 Visit OT Treatments $Self Care/Home Management : 38-52 mins  Malachy Chamber, OTR/L Acute Rehab Services Office: [phone]    Layla Maw 01/25/2021, 11:07 AM

## 2021-01-25 NOTE — Progress Notes (Signed)
Subjective:  Patient denies any chest pain.  States breathing has slightly improved.  Walked in the hallway earlier today.  Reviewed old records from the office.  Patient was on losartan in the past, which she tolerated it well without any side effects.  Patient denies any history of angioedema.  Objective:  Vital Signs in the last 24 hours: Temp:  [97.6 F (36.4 C)-98.3 F (36.8 C)] 98 F (36.7 C) (08/30 1158) Pulse Rate:  [70-79] 79 (08/30 1158) Resp:  [15-20] 18 (08/30 1158) BP: (140-172)/(59-93) 155/93 (08/30 1158) SpO2:  [92 %-100 %] 99 % (08/30 1158) Weight:  [80.7 kg] 80.7 kg (08/30 0420)  Intake/Output from previous day: 08/29 0701 - 08/30 0700 In: 240 [P.O.:240] Out: -  Intake/Output from this shift: Total I/O In: 120 [P.O.:120] Out: -   Physical Exam: Neck: no adenopathy, no carotid bruit, no JVD, and supple, symmetrical, trachea midline Lungs: decreased breath sounds at bases Heart: regular rate and rhythm, S1, S2 normal, and soft systolic murmur noted Abdomen: soft, non-tender; bowel sounds normal; no masses,  no organomegaly Extremities: no clubbing, cyanosis.  Trace edema noted  Lab Results: No results for input(s): WBC, HGB, PLT in the last 72 hours. Recent Labs    01/23/21 0253 01/24/21 0151  NA 137 139  K 4.2 4.1  CL 103 106  CO2 25 26  GLUCOSE 121* 140*  BUN 21 18  CREATININE 1.49* 1.38*   No results for input(s): TROPONINI in the last 72 hours.  Invalid input(s): CK, MB Hepatic Function Panel No results for input(s): PROT, ALBUMIN, AST, ALT, ALKPHOS, BILITOT, BILIDIR, IBILI in the last 72 hours. No results for input(s): CHOL in the last 72 hours. No results for input(s): PROTIME in the last 72 hours.  Imaging: DG Chest 2 View  Result Date: 01/25/2021 CLINICAL DATA:  Post cardiac surgery 01/17/2021 EXAM: CHEST - 2 VIEW COMPARISON:  01/19/2021 FINDINGS: Enlargement of cardiac silhouette post CABG. Mediastinal contours and pulmonary vascularity  normal. Persistent perihilar infiltrates likely pulmonary edema. Bibasilar pleural effusions and atelectasis greater on RIGHT. No pneumothorax. IMPRESSION: Enlargement of cardiac silhouette with probable mild persistent pulmonary edema post CABG. Bibasilar pleural effusions and atelectasis, slightly greater on RIGHT, not significantly changed. Electronically Signed   By: Lavonia Dana M.D.   On: 01/25/2021 10:40    Cardiac Studies:  Assessment/Plan:  Acute NSTEMI status post left cardiac catheterization, 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  status post CABG 3, postop day8 doing well Status post paroxysmal A. 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  Status post PCI to RCA in November 2015 Hypertension. Hyperlipidemia. Diabetes mellitus. History of CVA in the past Peripheral vascular disease, history of renal stenting and femoral popliteal bypass in the past. 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 Acute on Chronic kidney disease, stage IIIB,improved Tobacco abuse. Postop atelectasis  Deconditioning Plan Reduce amlodipine to 5 mg daily Start losartan 25 mg daily. Patient encouraged incentive spirometry. Awaiting skilled nursing facility  LOS: 15 days    [name] 01/25/2021, 3:31 PM

## 2021-01-26 LAB — SARS CORONAVIRUS 2 (TAT 6-24 HRS): SARS Coronavirus 2: NEGATIVE

## 2021-01-26 LAB — GLUCOSE, CAPILLARY
Glucose-Capillary: 115 mg/dL — ABNORMAL HIGH (ref 70–99)
Glucose-Capillary: 118 mg/dL — ABNORMAL HIGH (ref 70–99)
Glucose-Capillary: 144 mg/dL — ABNORMAL HIGH (ref 70–99)
Glucose-Capillary: 159 mg/dL — ABNORMAL HIGH (ref 70–99)
Glucose-Capillary: 151 mg/dL — ABNORMAL HIGH (ref 70–99)

## 2021-01-26 MED ORDER — FUROSEMIDE 40 MG PO TABS
40.0000 mg | ORAL_TABLET | Freq: Every day | ORAL | Status: DC
  Administered 2021-01-26 – 2021-01-27 (×2): 40 mg via ORAL
  Filled 2021-01-26 (×2): qty 1

## 2021-01-26 MED ORDER — VARENICLINE TARTRATE 0.5 MG PO TABS
0.5000 mg | ORAL_TABLET | Freq: Once | ORAL | Status: AC
  Administered 2021-01-26: 0.5 mg via ORAL
  Filled 2021-01-26: qty 1

## 2021-01-26 MED ORDER — POTASSIUM CHLORIDE CRYS ER 20 MEQ PO TBCR
20.0000 meq | EXTENDED_RELEASE_TABLET | Freq: Every day | ORAL | Status: DC
  Administered 2021-01-26 – 2021-01-27 (×2): 20 meq via ORAL
  Filled 2021-01-26 (×2): qty 1

## 2021-01-26 NOTE — Progress Notes (Signed)
CARDIAC REHAB PHASE I   PRE:  Rate/Rhythm: 77 SR    BP: sitting 162/65    SaO2: 95 1 1/2L  MODE:  Ambulation: 50 ft   POST:  Rate/Rhythm: 90 SR    BP: sitting 178/74     SaO2: 86 1 1/2L  Pt sts she feels tired today. Able to turn in bed and get to EOB. Stood independently from raised surface. Walked with RW and 2L O2. Slow and steady. C/o sudden fatigue after 25 ft in hall. Sat and rested, SaO2 90 2L. Stood independently with rocking and returned to bed, SaO2 down to 86 1 1/2L. Pt took 1-2 min to increase SaO2 to 90. To bed for rest (had been in recliner earlier today). Encouraged IS and more mobility. 500 ml max on IS which was what she did preop. Kennedy, ACSM 01/26/2021 3:15 PM

## 2021-01-26 NOTE — Progress Notes (Signed)
Subjective:  Patient denies any chest pain.  States breathing is slowly improving  Objective:  Vital Signs in the last 24 hours: Temp:  [97.9 F (36.6 C)-98.6 F (37 C)] 98.5 F (36.9 C) (08/31 1100) Pulse Rate:  [66-76] 73 (08/31 1100) Resp:  [19-20] 20 (08/31 1100) BP: (146-168)/(52-64) 166/64 (08/31 1100) SpO2:  [96 %-100 %] 96 % (08/31 1100) Weight:  [78.8 kg] 78.8 kg (08/31 0200)  Intake/Output from previous day: 08/30 0701 - 08/31 0700 In: 120 [P.O.:120] Out: 400 [Urine:400] Intake/Output from this shift: Total I/O In: -  Out: 300 [Urine:300]  Physical Exam: Neck: no adenopathy, no carotid bruit, no JVD, and supple, symmetrical, trachea midline Lungs: decreased breath sounds at bases Heart: regular rate and rhythm, S1, S2 normal, and soft systolic murmur noted.  No S3 gallop Abdomen: soft, non-tender; bowel sounds normal; no masses,  no organomegaly Extremities: no clubbing, cyanosis.  Trace edema noted  Lab Results: No results for input(s): WBC, HGB, PLT in the last 72 hours. Recent Labs    01/24/21 0151  NA 139  K 4.1  CL 106  CO2 26  GLUCOSE 140*  BUN 18  CREATININE 1.38*   No results for input(s): TROPONINI in the last 72 hours.  Invalid input(s): CK, MB Hepatic Function Panel No results for input(s): PROT, ALBUMIN, AST, ALT, ALKPHOS, BILITOT, BILIDIR, IBILI in the last 72 hours. No results for input(s): CHOL in the last 72 hours. No results for input(s): PROTIME in the last 72 hours.  Imaging: DG Chest 2 View  Result Date: 01/25/2021 CLINICAL DATA:  Post cardiac surgery 01/17/2021 EXAM: CHEST - 2 VIEW COMPARISON:  01/19/2021 FINDINGS: Enlargement of cardiac silhouette post CABG. Mediastinal contours and pulmonary vascularity normal. Persistent perihilar infiltrates likely pulmonary edema. Bibasilar pleural effusions and atelectasis greater on RIGHT. No pneumothorax. IMPRESSION: Enlargement of cardiac silhouette with probable mild persistent pulmonary  edema post CABG. Bibasilar pleural effusions and atelectasis, slightly greater on RIGHT, not significantly changed. Electronically Signed   By: Lavonia Dana M.D.   On: 01/25/2021 10:40    Cardiac Studies:  Assessment/Plan:  Acute NSTEMI status post left cardiac catheterization, 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  status post CABG 3, postop day9 doing well Status post paroxysmal A. 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  Status post PCI to RCA in November 2015 Hypertension. Hyperlipidemia. Diabetes mellitus. History of CVA in the past Peripheral vascular disease, history of renal stenting and femoral popliteal bypass in the past. 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 Acute on Chronic kidney disease, stage IIIB,improved Tobacco abuse. Postop atelectasis  Deconditioning Plan Agree with low-dose Lasix. Will up titrate losartan once off diuretics as outpatient and if renal function remains stable Awaiting skilled nursing facility. I will sign off. Follow-up with me in one to 2 weeks  LOS: 16 days    [name] 01/26/2021, 1:38 PM

## 2021-01-26 NOTE — Progress Notes (Addendum)
[address]       [place] [address]             [phone]      9 Days Post-Op Procedure(s) (LRB): CORONARY ARTERY BYPASS GRAFTING (CABG) TIMES 3 , ON PUMP, USING LEFT INTERNAL MAMMARY ARTERY AND ENDOSCOPICALLY HARVESTED RIGHT GREATER SAPHENOUS VEIN (N/A) TRANSESOPHAGEAL ECHOCARDIOGRAM (TEE) (N/A) APPLICATION OF CELL SAVER ENDOVEIN HARVEST OF GREATER SAPHENOUS VEIN (Right) Subjective: Feels pretty well, c/o left hand swelling  Objective: Vital signs in last 24 hours: Temp:  [97.9 F (36.6 C)-98.6 F (37 C)] 98.2 F (36.8 C) (08/31 0731) Pulse Rate:  [66-79] 72 (08/31 0731) Cardiac Rhythm: Normal sinus rhythm (08/30 1925) Resp:  [18-20] 20 (08/31 0731) BP: (146-168)/(52-93) 161/63 (08/31 0731) SpO2:  [98 %-100 %] 100 % (08/31 0731) Weight:  [78.8 kg] 78.8 kg (08/31 0200)  Hemodynamic parameters for last 24 hours:    Intake/Output from previous day: 08/30 0701 - 08/31 0700 In: 120 [P.O.:120] Out: 400 [Urine:400] Intake/Output this shift: Total I/O In: -  Out: 300 [Urine:300]  General appearance: alert, cooperative, and no distress Heart: regular rate and rhythm Lungs: mildly dim in bases Abdomen: soft, non tender Extremities: min edema Wound: incis healing well Minor swelling left hand, motor/sensory/strength grossly intact Lab Results: No results for input(s): WBC, HGB, HCT, PLT in the last 72 hours. BMET:  Recent Labs    01/24/21 0151  NA 139  K 4.1  CL 106  CO2 26  GLUCOSE 140*  BUN 18  CREATININE 1.38*  CALCIUM 8.4*    PT/INR: No results for input(s): LABPROT, INR in the last 72 hours. ABG    Component Value Date/Time   PHART 7.380 01/19/2021 0131   HCO3 23.6 01/19/2021 0131   TCO2 25 01/19/2021 0131   ACIDBASEDEF 1.0 01/19/2021 0131   O2SAT 98.0 01/19/2021 0131   CBG (last 3)  Recent Labs    01/25/21 2101 01/26/21 0605 01/26/21 0722  GLUCAP 112* 115* 118*    Meds Scheduled Meds:  amLODipine  5 mg Oral  Daily   aspirin EC  81 mg Oral Daily   bisacodyl  10 mg Oral Daily   Or   bisacodyl  10 mg Rectal Daily   clopidogrel  75 mg Oral Daily   insulin aspart  0-24 Units Subcutaneous TID WC   isosorbide mononitrate  30 mg Oral Daily   losartan  25 mg Oral Daily   mouth rinse  15 mL Mouth Rinse BID   pantoprazole  40 mg Oral Daily   rosuvastatin  10 mg Oral QHS   varenicline  0.5 mg Oral BID   vitamin B-12  250 mcg Oral Daily   Continuous Infusions:  sodium chloride     PRN Meds:.acetaminophen, hydrALAZINE, ipratropium-albuterol, magnesium hydroxide, ondansetron (ZOFRAN) IV, QUEtiapine, sodium chloride flush, sodium chloride flush  Xrays DG Chest 2 View  Result Date: 01/25/2021 CLINICAL DATA:  Post cardiac surgery 01/17/2021 EXAM: CHEST - 2 VIEW COMPARISON:  01/19/2021 FINDINGS: Enlargement of cardiac silhouette post CABG. Mediastinal contours and pulmonary vascularity normal. Persistent perihilar infiltrates likely pulmonary edema. Bibasilar pleural effusions and atelectasis greater on RIGHT. No pneumothorax. IMPRESSION: Enlargement of cardiac silhouette with probable mild persistent pulmonary edema post CABG. Bibasilar pleural effusions and atelectasis, slightly greater on RIGHT, not significantly changed. Electronically Signed   By: Lavonia Dana M.D.   On: 01/25/2021 10:40    Assessment/Plan: S/P Procedure(s) (LRB): CORONARY ARTERY BYPASS GRAFTING (CABG) TIMES 3 ,  ON PUMP, USING LEFT INTERNAL MAMMARY ARTERY AND ENDOSCOPICALLY HARVESTED RIGHT GREATER SAPHENOUS VEIN (N/A) TRANSESOPHAGEAL ECHOCARDIOGRAM (TEE) (N/A) APPLICATION OF CELL SAVER ENDOVEIN HARVEST OF GREATER SAPHENOUS VEIN (Right)  1 afeb, VSS, BP remains elevated- losartan added by cardiology yesterday, Imdur also restarted 2 sats good on 2 liters 3 awaiting SNF approval 4 good UOP, weight trending down  5 will recheck creat tomorrow as now on an ARB and had AKI 6 cont to wean O2 off and cont therapies 7 on DAPT 8 monitor  left hand, could consider , duplex but seems pretty mild 9 CXR with basilar ASD, some effusion, may need to consider thoracentesis, will add some lasix- may assist with getting off O2    LOS: 16 days    [name] PA-C Pager [phone] 01/26/2021  Agree with above Restarting diuresis today [place]

## 2021-01-26 NOTE — TOC Progression Note (Signed)
Transition of Care [facility]) - Progression Note    Patient Details  Name: Jacklynn Dehaas MRN: [id number] Date of Birth: [birth year]  Transition of Care [facility]) CM/SW [place], [place] Phone Number: 01/26/2021, 4:27 PM  Clinical Narrative:     CSW informed by [facility]/SNF-  SNF has received auth. CSW has requested covid test.   Informed [facility] possible d/c tomorrow.   Thurmond Butts, MSW, LCSW Clinical Social Worker    Expected Discharge Plan: Skilled Nursing Facility Barriers to Discharge: Continued Medical Work up  Expected Discharge Plan and Services Expected Discharge Plan: [place] In-house Referral: Clinical Social Work     Living arrangements for the past 2 months: Apartment                                       Social Determinants of Health (SDOH) Interventions    Readmission Risk Interventions No flowsheet data found.

## 2021-01-27 ENCOUNTER — Inpatient Hospital Stay (HOSPITAL_COMMUNITY): Payer: Medicare HMO

## 2021-01-27 DIAGNOSIS — Z7982 Long term (current) use of aspirin: Secondary | ICD-10-CM | POA: Diagnosis not present

## 2021-01-27 DIAGNOSIS — E875 Hyperkalemia: Secondary | ICD-10-CM | POA: Diagnosis not present

## 2021-01-27 DIAGNOSIS — J069 Acute upper respiratory infection, unspecified: Secondary | ICD-10-CM | POA: Diagnosis not present

## 2021-01-27 DIAGNOSIS — R5383 Other fatigue: Secondary | ICD-10-CM | POA: Diagnosis not present

## 2021-01-27 DIAGNOSIS — N183 Chronic kidney disease, stage 3 unspecified: Secondary | ICD-10-CM | POA: Diagnosis not present

## 2021-01-27 DIAGNOSIS — Z96652 Presence of left artificial knee joint: Secondary | ICD-10-CM | POA: Diagnosis not present

## 2021-01-27 DIAGNOSIS — I252 Old myocardial infarction: Secondary | ICD-10-CM | POA: Diagnosis not present

## 2021-01-27 DIAGNOSIS — E1122 Type 2 diabetes mellitus with diabetic chronic kidney disease: Secondary | ICD-10-CM | POA: Diagnosis not present

## 2021-01-27 DIAGNOSIS — J90 Pleural effusion, not elsewhere classified: Secondary | ICD-10-CM | POA: Diagnosis not present

## 2021-01-27 DIAGNOSIS — E1159 Type 2 diabetes mellitus with other circulatory complications: Secondary | ICD-10-CM | POA: Diagnosis not present

## 2021-01-27 DIAGNOSIS — R41841 Cognitive communication deficit: Secondary | ICD-10-CM | POA: Diagnosis not present

## 2021-01-27 DIAGNOSIS — M549 Dorsalgia, unspecified: Secondary | ICD-10-CM | POA: Diagnosis not present

## 2021-01-27 DIAGNOSIS — E1351 Other specified diabetes mellitus with diabetic peripheral angiopathy without gangrene: Secondary | ICD-10-CM | POA: Diagnosis not present

## 2021-01-27 DIAGNOSIS — Z8679 Personal history of other diseases of the circulatory system: Secondary | ICD-10-CM | POA: Diagnosis not present

## 2021-01-27 DIAGNOSIS — I2510 Atherosclerotic heart disease of native coronary artery without angina pectoris: Secondary | ICD-10-CM | POA: Diagnosis not present

## 2021-01-27 DIAGNOSIS — Z87891 Personal history of nicotine dependence: Secondary | ICD-10-CM | POA: Diagnosis not present

## 2021-01-27 DIAGNOSIS — Z951 Presence of aortocoronary bypass graft: Secondary | ICD-10-CM | POA: Diagnosis not present

## 2021-01-27 DIAGNOSIS — I739 Peripheral vascular disease, unspecified: Secondary | ICD-10-CM | POA: Diagnosis not present

## 2021-01-27 DIAGNOSIS — R531 Weakness: Secondary | ICD-10-CM | POA: Diagnosis not present

## 2021-01-27 DIAGNOSIS — I517 Cardiomegaly: Secondary | ICD-10-CM | POA: Diagnosis not present

## 2021-01-27 DIAGNOSIS — Z7984 Long term (current) use of oral hypoglycemic drugs: Secondary | ICD-10-CM | POA: Diagnosis not present

## 2021-01-27 DIAGNOSIS — N179 Acute kidney failure, unspecified: Secondary | ICD-10-CM | POA: Diagnosis not present

## 2021-01-27 DIAGNOSIS — I63311 Cerebral infarction due to thrombosis of right middle cerebral artery: Secondary | ICD-10-CM | POA: Diagnosis not present

## 2021-01-27 DIAGNOSIS — J811 Chronic pulmonary edema: Secondary | ICD-10-CM | POA: Diagnosis not present

## 2021-01-27 DIAGNOSIS — E785 Hyperlipidemia, unspecified: Secondary | ICD-10-CM | POA: Diagnosis not present

## 2021-01-27 DIAGNOSIS — R634 Abnormal weight loss: Secondary | ICD-10-CM | POA: Diagnosis not present

## 2021-01-27 DIAGNOSIS — Z7902 Long term (current) use of antithrombotics/antiplatelets: Secondary | ICD-10-CM | POA: Diagnosis not present

## 2021-01-27 DIAGNOSIS — I129 Hypertensive chronic kidney disease with stage 1 through stage 4 chronic kidney disease, or unspecified chronic kidney disease: Secondary | ICD-10-CM | POA: Diagnosis not present

## 2021-01-27 DIAGNOSIS — Z130 Encounter for screening for diseases of the blood and blood-forming organs and certain disorders involving the immune mechanism: Secondary | ICD-10-CM | POA: Diagnosis not present

## 2021-01-27 DIAGNOSIS — N189 Chronic kidney disease, unspecified: Secondary | ICD-10-CM | POA: Diagnosis not present

## 2021-01-27 DIAGNOSIS — R069 Unspecified abnormalities of breathing: Secondary | ICD-10-CM | POA: Diagnosis not present

## 2021-01-27 DIAGNOSIS — Z20818 Contact with and (suspected) exposure to other bacterial communicable diseases: Secondary | ICD-10-CM | POA: Diagnosis not present

## 2021-01-27 DIAGNOSIS — D62 Acute posthemorrhagic anemia: Secondary | ICD-10-CM | POA: Diagnosis not present

## 2021-01-27 DIAGNOSIS — I10 Essential (primary) hypertension: Secondary | ICD-10-CM | POA: Diagnosis not present

## 2021-01-27 DIAGNOSIS — R600 Localized edema: Secondary | ICD-10-CM | POA: Diagnosis not present

## 2021-01-27 DIAGNOSIS — R5381 Other malaise: Secondary | ICD-10-CM | POA: Diagnosis not present

## 2021-01-27 DIAGNOSIS — I25119 Atherosclerotic heart disease of native coronary artery with unspecified angina pectoris: Secondary | ICD-10-CM | POA: Diagnosis not present

## 2021-01-27 DIAGNOSIS — F321 Major depressive disorder, single episode, moderate: Secondary | ICD-10-CM | POA: Diagnosis not present

## 2021-01-27 DIAGNOSIS — F4323 Adjustment disorder with mixed anxiety and depressed mood: Secondary | ICD-10-CM | POA: Diagnosis not present

## 2021-01-27 DIAGNOSIS — N1832 Chronic kidney disease, stage 3b: Secondary | ICD-10-CM | POA: Diagnosis not present

## 2021-01-27 DIAGNOSIS — I714 Abdominal aortic aneurysm, without rupture: Secondary | ICD-10-CM | POA: Diagnosis not present

## 2021-01-27 DIAGNOSIS — M6281 Muscle weakness (generalized): Secondary | ICD-10-CM | POA: Diagnosis not present

## 2021-01-27 DIAGNOSIS — F17200 Nicotine dependence, unspecified, uncomplicated: Secondary | ICD-10-CM | POA: Diagnosis not present

## 2021-01-27 DIAGNOSIS — Z79899 Other long term (current) drug therapy: Secondary | ICD-10-CM | POA: Diagnosis not present

## 2021-01-27 DIAGNOSIS — Z7401 Bed confinement status: Secondary | ICD-10-CM | POA: Diagnosis not present

## 2021-01-27 DIAGNOSIS — Z96642 Presence of left artificial hip joint: Secondary | ICD-10-CM | POA: Diagnosis not present

## 2021-01-27 DIAGNOSIS — R0789 Other chest pain: Secondary | ICD-10-CM | POA: Diagnosis not present

## 2021-01-27 DIAGNOSIS — K219 Gastro-esophageal reflux disease without esophagitis: Secondary | ICD-10-CM | POA: Diagnosis not present

## 2021-01-27 DIAGNOSIS — E1151 Type 2 diabetes mellitus with diabetic peripheral angiopathy without gangrene: Secondary | ICD-10-CM | POA: Diagnosis not present

## 2021-01-27 DIAGNOSIS — J9600 Acute respiratory failure, unspecified whether with hypoxia or hypercapnia: Secondary | ICD-10-CM | POA: Diagnosis not present

## 2021-01-27 DIAGNOSIS — R079 Chest pain, unspecified: Secondary | ICD-10-CM | POA: Diagnosis not present

## 2021-01-27 DIAGNOSIS — I25110 Atherosclerotic heart disease of native coronary artery with unstable angina pectoris: Secondary | ICD-10-CM | POA: Diagnosis not present

## 2021-01-27 DIAGNOSIS — R2681 Unsteadiness on feet: Secondary | ICD-10-CM | POA: Diagnosis not present

## 2021-01-27 DIAGNOSIS — J9811 Atelectasis: Secondary | ICD-10-CM | POA: Diagnosis not present

## 2021-01-27 DIAGNOSIS — I214 Non-ST elevation (NSTEMI) myocardial infarction: Secondary | ICD-10-CM | POA: Diagnosis not present

## 2021-01-27 DIAGNOSIS — E119 Type 2 diabetes mellitus without complications: Secondary | ICD-10-CM | POA: Diagnosis not present

## 2021-01-27 LAB — GLUCOSE, CAPILLARY
Glucose-Capillary: 128 mg/dL — ABNORMAL HIGH (ref 70–99)
Glucose-Capillary: 156 mg/dL — ABNORMAL HIGH (ref 70–99)
Glucose-Capillary: 177 mg/dL — ABNORMAL HIGH (ref 70–99)

## 2021-01-27 LAB — BASIC METABOLIC PANEL
Anion gap: 9 (ref 5–15)
BUN: 14 mg/dL (ref 8–23)
CO2: 25 mmol/L (ref 22–32)
Calcium: 8.3 mg/dL — ABNORMAL LOW (ref 8.9–10.3)
Chloride: 103 mmol/L (ref 98–111)
Creatinine, Ser: 1.35 mg/dL — ABNORMAL HIGH (ref 0.44–1.00)
GFR, Estimated: 39 mL/min — ABNORMAL LOW (ref >60)
Glucose, Bld: 123 mg/dL — ABNORMAL HIGH (ref 70–99)
Potassium: 4.5 mmol/L (ref 3.5–5.1)
Sodium: 137 mmol/L (ref 135–145)

## 2021-01-27 MED ORDER — ROSUVASTATIN CALCIUM 10 MG PO TABS: 10.0000 mg | ORAL_TABLET | Freq: Every day | ORAL | Status: AC

## 2021-01-27 MED ORDER — ASPIRIN 81 MG PO TBEC: 81.0000 mg | DELAYED_RELEASE_TABLET | Freq: Every day | ORAL | 11 refills | Status: AC

## 2021-01-27 MED ORDER — PANTOPRAZOLE SODIUM 40 MG PO TBEC: 40.0000 mg | DELAYED_RELEASE_TABLET | Freq: Every day | ORAL | Status: AC

## 2021-01-27 MED ORDER — CLOPIDOGREL BISULFATE 75 MG PO TABS: 75.0000 mg | ORAL_TABLET | Freq: Every day | ORAL | Status: DC

## 2021-01-27 MED ORDER — LOSARTAN POTASSIUM 25 MG PO TABS: 25.0000 mg | ORAL_TABLET | Freq: Every day | ORAL | Status: DC

## 2021-01-27 MED ORDER — FUROSEMIDE 40 MG PO TABS: 40.0000 mg | ORAL_TABLET | Freq: Every day | ORAL | 0 refills | Status: DC

## 2021-01-27 MED ORDER — ISOSORBIDE MONONITRATE ER 30 MG PO TB24: 30.0000 mg | ORAL_TABLET | Freq: Every day | ORAL | Status: DC

## 2021-01-27 MED ORDER — POTASSIUM CHLORIDE CRYS ER 20 MEQ PO TBCR: 20.0000 meq | EXTENDED_RELEASE_TABLET | Freq: Every day | ORAL | 0 refills | Status: DC

## 2021-01-27 MED ORDER — CYANOCOBALAMIN 250 MCG PO TABS: 250.0000 ug | ORAL_TABLET | Freq: Every day | ORAL | Status: AC

## 2021-01-27 NOTE — Progress Notes (Signed)
   01/27/21 1520  Clinical Encounter Type  Visited With Patient and family together  Visit Type Follow-up  Referral From Nurse  Consult/Referral To Chaplain   Chaplain responded Advance Directive consult. Pt's daughter, [name], informed Chaplain that completed paperwork was at the nurses' station. Kieth Brightly coordinated with volunteer services for them to stay late and assist with the notarization. Pt's daughter was very appreciate that the AD was able to be completed before the Pt's discharge. The original document and two copies were given to the Pt's daughter and one copy was placed in the Pt's physical file. Chaplain remains available.  This note was prepared by Chaplain Resident, Dante Gang, MDiv. Chaplain remains available as needed through the on-call pager: [phone].

## 2021-01-27 NOTE — Plan of Care (Signed)
  Problem: Education: Goal: Knowledge of General Education information will improve Description: Including pain rating scale, medication(s)/side effects and non-pharmacologic comfort measures Outcome: Progressing   Problem: Health Behavior/Discharge Planning: Goal: Ability to manage health-related needs will improve Outcome: Progressing   Problem: Clinical Measurements: Goal: Ability to maintain clinical measurements within normal limits will improve Outcome: Progressing Goal: Will remain free from infection Outcome: Progressing Goal: Diagnostic test results will improve Outcome: Progressing Goal: Respiratory complications will improve Outcome: Progressing Goal: Cardiovascular complication will be avoided Outcome: Progressing   Problem: Nutrition: Goal: Adequate nutrition will be maintained Outcome: Progressing   Problem: Coping: Goal: Level of anxiety will decrease Outcome: Progressing   Problem: Pain Managment: Goal: General experience of comfort will improve Outcome: Progressing   Problem: Safety: Goal: Ability to remain free from injury will improve Outcome: Progressing   Problem: Education: Goal: Will demonstrate proper wound care and an understanding of methods to prevent future damage Outcome: Progressing Goal: Knowledge of disease or condition will improve Outcome: Progressing Goal: Knowledge of the prescribed therapeutic regimen will improve Outcome: Progressing Goal: Individualized Educational Video(s) Outcome: Progressing   Problem: Activity: Goal: Risk for activity intolerance will decrease Outcome: Progressing   Problem: Cardiac: Goal: Will achieve and/or maintain hemodynamic stability Outcome: Progressing   Problem: Clinical Measurements: Goal: Postoperative complications will be avoided or minimized Outcome: Progressing   Problem: Respiratory: Goal: Respiratory status will improve Outcome: Progressing   Problem: Skin Integrity: Goal:  Wound healing without signs and symptoms of infection Outcome: Progressing Goal: Risk for impaired skin integrity will decrease Outcome: Progressing   Problem: Urinary Elimination: Goal: Ability to achieve and maintain adequate renal perfusion and functioning will improve Outcome: Progressing

## 2021-01-27 NOTE — Progress Notes (Signed)
OT Cancellation Note  Patient Details Name: [name] MRN: [id number] DOB: 01-Jul-1940   Cancelled Treatment:    Reason Eval/Treat Not Completed: Patient at procedure or test/ unavailable Pt off unit for chest x-ray. Will follow-up for OT session as appropriate.  Layla Maw 01/27/2021, 9:30 AM

## 2021-01-27 NOTE — Progress Notes (Signed)
Physical Therapy Treatment Patient Details Name: [name] MRN: [id number] DOB: 02-May-1941 Today's Date: 01/27/2021    History of Present Illness Pt is an 80 y/o female admitted 8/14 with chest pain and SOB. She is s/p L heart cath with diagnosis of multivessel CAD and pt s/p CABG x3 8/22.  Neurology consulted 8/24 due to numbness and tingling of L hand.  PMHx: AAA, DM, Glaucoma, HTN, MI, PVD, Stroke, daily smoker.    PT Comments    The pt was eager to participate in session, but presents with continued deficits in strength, power, and endurance that limited her ability to complete sit-stand transfers this session without maxA. Despite attempts to use momentum and elevated surfaces, the pt was unable to generate hip clearance to initiate stand without assist, and often required repeated attempts even with assist at this time. The pt was tearful regarding difficulties, asking to speak with chaplain at this time (RN notified). The pt was limited to short distance ambulation in the room this AM, but was able to complete with VSS on RA initially, SpO2 dropped to 88% after 10 ft with RW, and improved with guided breathing and 0.5L O2. Continue to recommend SNF rehab at d/c to progress pt strength, power, and endurance to allow for greater independence with mobility.     Follow Up Recommendations  SNF;Supervision/Assistance - 24 hour     Equipment Recommendations  3in1 (PT)    Recommendations for Other Services       Precautions / Restrictions Precautions Precautions: Fall;Sternal Precaution Booklet Issued: Yes (comment) Precaution Comments: sternal handout Restrictions Weight Bearing Restrictions: No Other Position/Activity Restrictions: sternal precautions    Mobility  Bed Mobility Overal bed mobility: Needs Assistance             General bed mobility comments: pt OOB upon arrival, returned to sitting EOB    Transfers Overall transfer level: Needs assistance Equipment  used: Rolling walker (2 wheeled) Transfers: Sit to/from Stand Sit to Stand: Mod assist;Max assist         General transfer comment: max cues for hand placement, pt attempting to use momentum to rise to standing, but was unable to achieve despite repeated efforts without maxA. pt frequently breaking sternal precautions to power up after multiple attempts with assist. asking for max increased time, attempting to complete x4 through session  Ambulation/Gait Ambulation/Gait assistance: Min assist Gait Distance (Feet): 10 Feet (+10) Assistive device: Rolling walker (2 wheeled) Gait Pattern/deviations: Step-to pattern;Decreased stride length;Trunk flexed Gait velocity: decreased Gait velocity interpretation: <1.31 ft/sec, indicative of household ambulator General Gait Details: Min A to steady with cues for RW proximity. pt able to ambulate to bathroom in room with minG/minA, but after exting asked to return to bed, reports legs shaky and needing to sit immediately. HR in 90s, SpO2 88-90% on RA, 94% on 0.5L O2       Balance Overall balance assessment: Needs assistance Sitting-balance support: No upper extremity supported;Feet supported Sitting balance-Leahy Scale: Good     Standing balance support: Bilateral upper extremity supported;During functional activity;Single extremity supported Standing balance-Leahy Scale: Poor Standing balance comment: BUE support for standing and walking, single UE su                            Cognition Arousal/Alertness: Awake/alert Behavior During Therapy: WFL for tasks assessed/performed Overall Cognitive Status: Impaired/Different from baseline Area of Impairment: Safety/judgement;Problem solving;Awareness  Following Commands: Follows one step commands consistently;Follows one step commands with increased time;Follows multi-step commands inconsistently Safety/Judgement: Decreased awareness of safety;Decreased  awareness of deficits Awareness: Emergent Problem Solving: Slow processing;Decreased initiation;Requires verbal cues General Comments: pt needs increased time for all movements.  continues stating her mind doesnt feel right thorugh session      Exercises General Exercises - Lower Extremity Ankle Circles/Pumps: AROM;Both;10 reps;Seated Long Arc Quad: AROM;Both;10 reps;Seated (with 5 sec hold) Hip Flexion/Marching: AROM;Both;10 reps;Seated    General Comments General comments (skin integrity, edema, etc.): pt 94-96% at rest on RA, walking on RA initially 92-94% for 10 ft, with walking from bathroom to bed pt 88-90% oon RA, returned to 94% onm 0.5L. pt seeming anxious, unable to slow breathing despite verbal cues      Pertinent Vitals/Pain Pain Assessment: Faces Faces Pain Scale: Hurts a little bit Pain Location: generalized with movement Pain Descriptors / Indicators: Sore Pain Intervention(s): Limited activity within patient's tolerance;Monitored during session;Repositioned     PT Goals (current goals can now be found in the care plan section) Acute Rehab PT Goals Patient Stated Goal: to get stronger, be able to go home independently PT Goal Formulation: With patient Time For Goal Achievement: 02/02/21 Potential to Achieve Goals: Good Progress towards PT goals: Progressing toward goals    Frequency    Min 3X/week      PT Plan Current plan remains appropriate       AM-PAC PT "6 Clicks" Mobility   Outcome Measure  Help needed turning from your back to your side while in a flat bed without using bedrails?: A Little Help needed moving from lying on your back to sitting on the side of a flat bed without using bedrails?: A Little Help needed moving to and from a bed to a chair (including a wheelchair)?: A Little Help needed standing up from a chair using your arms (e.g., wheelchair or bedside chair)?: A Lot Help needed to walk in hospital room?: A Little Help needed  climbing 3-5 steps with a railing? : A Lot 6 Click Score: 16    End of Session Equipment Utilized During Treatment: Gait belt;Oxygen Activity Tolerance: Patient tolerated treatment well Patient left: in bed;with call bell/phone within reach (pt sitting EOB) Nurse Communication: Mobility status (pt asking for chaplain) PT Visit Diagnosis: Other abnormalities of gait and mobility (R26.89);Muscle weakness (generalized) (M62.81);Difficulty in walking, not elsewhere classified (R26.2);Other symptoms and signs involving the nervous system (O17.510)     Time: 2585-2778 PT Time Calculation (min) (ACUTE ONLY): 49 min  Charges:  $Gait Training: 8-22 mins $Therapeutic Activity: 23-37 mins                     [place] Carbo, PT, DPT   Acute Rehabilitation Department Pager #: [phone]   Sandra Cockayne 01/27/2021, 10:25 AM

## 2021-01-27 NOTE — Progress Notes (Signed)
[address]       [place] [address]             [phone]      10 Days Post-Op Procedure(s) (LRB): CORONARY ARTERY BYPASS GRAFTING (CABG) TIMES 3 , ON PUMP, USING LEFT INTERNAL MAMMARY ARTERY AND ENDOSCOPICALLY HARVESTED RIGHT GREATER SAPHENOUS VEIN (N/A) TRANSESOPHAGEAL ECHOCARDIOGRAM (TEE) (N/A) APPLICATION OF CELL SAVER ENDOVEIN HARVEST OF GREATER SAPHENOUS VEIN (Right) Subjective: Feeling better, still having some left hand numbness but says it is better. Has been walking in her room this morning.  Remains on 2Lnc O2.   Objective: Vital signs in last 24 hours: Temp:  [97.8 F (36.6 C)-98.5 F (36.9 C)] 97.9 F (36.6 C) (09/01 0346) Pulse Rate:  [72-77] 72 (09/01 0346) Cardiac Rhythm: Normal sinus rhythm (09/01 0700) Resp:  [16-20] 19 (09/01 0346) BP: (133-166)/(58-64) 154/60 (09/01 0346) SpO2:  [93 %-98 %] 96 % (09/01 0346) Weight:  [78.1 kg] 78.1 kg (09/01 0346)     Intake/Output from previous day: 08/31 0701 - 09/01 0700 In: 840 [P.O.:840] Out: 901 [Urine:900; Stool:1] Intake/Output this shift: No intake/output data recorded.  General appearance: alert, cooperative, and no distress Heart: regular rate and rhythm Lungs: breath sounds clear, diminished in bases Abdomen: soft, non tender Extremities: no LE edema Wound: the sternotomy incision and left EVH incisions are intact and dry Minimal swelling left hand, motor/sensory/strength remain  grossly intact Lab Results: No results for input(s): WBC, HGB, HCT, PLT in the last 72 hours. BMET:  Recent Labs    01/27/21 0141  NA 137  K 4.5  CL 103  CO2 25  GLUCOSE 123*  BUN 14  CREATININE 1.35*  CALCIUM 8.3*     PT/INR: No results for input(s): LABPROT, INR in the last 72 hours. ABG    Component Value Date/Time   PHART 7.380 01/19/2021 0131   HCO3 23.6 01/19/2021 0131   TCO2 25 01/19/2021 0131   ACIDBASEDEF 1.0 01/19/2021 0131   O2SAT 98.0 01/19/2021 0131   CBG (last 3)   Recent Labs    01/26/21 1627 01/26/21 2110 01/27/21 0628  GLUCAP 159* 151* 128*     Meds Scheduled Meds:  amLODipine  5 mg Oral Daily   aspirin EC  81 mg Oral Daily   bisacodyl  10 mg Oral Daily   Or   bisacodyl  10 mg Rectal Daily   clopidogrel  75 mg Oral Daily   furosemide  40 mg Oral Daily   insulin aspart  0-24 Units Subcutaneous TID WC   isosorbide mononitrate  30 mg Oral Daily   losartan  25 mg Oral Daily   mouth rinse  15 mL Mouth Rinse BID   pantoprazole  40 mg Oral Daily   potassium chloride  20 mEq Oral Daily   rosuvastatin  10 mg Oral QHS   vitamin B-12  250 mcg Oral Daily   Continuous Infusions:  sodium chloride     PRN Meds:.acetaminophen, hydrALAZINE, ipratropium-albuterol, magnesium hydroxide, ondansetron (ZOFRAN) IV, QUEtiapine, sodium chloride flush, sodium chloride flush  Xrays DG Chest 2 View  Result Date: 01/25/2021 CLINICAL DATA:  Post cardiac surgery 01/17/2021 EXAM: CHEST - 2 VIEW COMPARISON:  01/19/2021 FINDINGS: Enlargement of cardiac silhouette post CABG. Mediastinal contours and pulmonary vascularity normal. Persistent perihilar infiltrates likely pulmonary edema. Bibasilar pleural effusions and atelectasis greater on RIGHT. No pneumothorax. IMPRESSION: Enlargement of cardiac silhouette with probable mild persistent pulmonary edema post CABG. Bibasilar pleural effusions and atelectasis, slightly  greater on RIGHT, not significantly changed. Electronically Signed   By: Lavonia Dana M.D.   On: 01/25/2021 10:40    Assessment/Plan: S/P Procedure(s) (LRB): CORONARY ARTERY BYPASS GRAFTING (CABG) TIMES 3 , ON PUMP, USING LEFT INTERNAL MAMMARY ARTERY AND ENDOSCOPICALLY HARVESTED RIGHT GREATER SAPHENOUS VEIN (N/A) TRANSESOPHAGEAL ECHOCARDIOGRAM (TEE) (N/A) APPLICATION OF CELL SAVER ENDOVEIN HARVEST OF GREATER SAPHENOUS VEIN (Right)  1 afeb, VSS, BP in the 150's elevated- losartan added by Dr. Terrence Dupont   8/30 and he plans to titrate as outpatient, Imdur  also restarted  2 sats good on 2 liters, continue diuresis  3 SNF approved, plan transfer later today.  4 good UOP, weight still 4kg +  5 Acute on chronic kidney disease-renal function stable after starting ARB  6 cont to wean O2 off and cont therapies  7 on DAPT after presenting with NSTEMI     LOS: 17 days    Antony Odea PA-C Pager [phone] 01/27/2021

## 2021-01-27 NOTE — TOC Transition Note (Signed)
Transition of Care [facility]) - CM/SW Discharge Note   Patient Details  Name: Kamee Bobst MRN: [id number] Date of Birth: Nov 02, 1940  [facility] CM/SW Contact:  Geralynn Ochs, LCSW Phone Number: 01/27/2021, 12:09 PM   Clinical Narrative:   Nurse to call report to [phone], Room 307.    Final next level of care: Skilled Nursing Facility Barriers to Discharge: Barriers Resolved   Patient Goals and CMS Choice Patient states their goals for this hospitalization and ongoing recovery are:: to go to SNF CMS Medicare.gov Compare Post Acute Care list provided to:: Patient Choice offered to / list presented to : Patient  Discharge Placement              Patient chooses bed at: [facility] Patient to be transferred to facility by: [place] Name of family member notified: [name] Patient and family notified of of transfer: 01/27/21  Discharge Plan and Services In-house Referral: Clinical Social Work                                   Social Determinants of Health ([place]) Interventions     Readmission Risk Interventions No flowsheet data found.

## 2021-01-27 NOTE — Progress Notes (Signed)
Report called to [employer].

## 2021-01-27 NOTE — Progress Notes (Signed)
Mobility Specialist: Progress Note   01/27/21 1617  Mobility  Activity Ambulated in hall  Level of Assistance Minimal assist, patient does 75% or more  Assistive Device Front wheel walker  Distance Ambulated (ft) 92 ft  Mobility Ambulated with assistance in hallway  Mobility Response Tolerated fair  Mobility performed by Mobility specialist  Bed Position Chair  $Mobility charge 1 Mobility   Pre-Mobility: 82 HR, 146/69 BP, 94% SpO2 Post-Mobility: 88 HR, 165/60 BP, 84>96% SpO2  Pt attempted to power up x2 from chair on her own but was unable. Pt required minA on the third attempt to stand. Pt c/o general fatigue and SOB during ambulation. Pt to recliner after walk with call bell in reach. Pt ambulated on 2 L/min [place] and desat to 84% after returning to the room. Pt coached through purse lipped breathing with sats increasing to 96% after 2 minutes rest.   Harrell Gave Aero [place]s Mobility Specialist Mobility Specialist Phone: [phone]

## 2021-01-27 NOTE — Consult Note (Signed)
   [facility] Inpatient Consult   01/27/2021  [name] 1941-02-28 [id number]  [place] [employer] [ACO] Patient: [employer] Medicare  Primary Care Provider:  [name], MD   LLOS: Review for    Patient screened for length of stay hospitalization to assess for potential [place] Management service needs for post hospital transition.  Review of patient's medical record reveals patient is being recommended for a skilled nursing facility level of care for transition needs.   Plan:  Continue to follow progress and disposition to assess for post hospital care management needs.    For questions contact:   Natividad Brood, RN BSN [facility] Liaison  [phone] business mobile phone Toll free office [phone]  Fax number: [phone] [name]

## 2021-01-27 NOTE — Discharge Instructions (Signed)

## 2021-01-27 NOTE — Progress Notes (Signed)
Discussed with pt IS, sternal precautions, mobility, smoking cessation, and CRPII. Pt receptive. She is eager to stay quit smoking. Will refer to [place].  [id number] Yves Dill CES, ACSM 12:18 PM 01/27/2021

## 2021-01-28 ENCOUNTER — Telehealth: Payer: Medicare HMO | Admitting: Thoracic Surgery (Cardiothoracic Vascular Surgery)

## 2021-01-28 ENCOUNTER — Encounter: Payer: Self-pay | Admitting: Internal Medicine

## 2021-01-28 ENCOUNTER — Non-Acute Institutional Stay (SKILLED_NURSING_FACILITY): Payer: Medicare HMO | Admitting: Internal Medicine

## 2021-01-28 DIAGNOSIS — E1159 Type 2 diabetes mellitus with other circulatory complications: Secondary | ICD-10-CM

## 2021-01-28 DIAGNOSIS — N183 Chronic kidney disease, stage 3 unspecified: Secondary | ICD-10-CM | POA: Diagnosis not present

## 2021-01-28 DIAGNOSIS — I10 Essential (primary) hypertension: Secondary | ICD-10-CM | POA: Diagnosis not present

## 2021-01-28 DIAGNOSIS — I214 Non-ST elevation (NSTEMI) myocardial infarction: Secondary | ICD-10-CM | POA: Diagnosis not present

## 2021-01-28 DIAGNOSIS — I25119 Atherosclerotic heart disease of native coronary artery with unspecified angina pectoris: Secondary | ICD-10-CM

## 2021-01-28 DIAGNOSIS — D62 Acute posthemorrhagic anemia: Secondary | ICD-10-CM | POA: Diagnosis not present

## 2021-01-28 NOTE — Assessment & Plan Note (Addendum)
Creatinine peaked @ 2.03 / GFR nadir 24 ; CKD Stage 4 Current creatinine 1.35 / GFR 39  ; CKD Stage 3b Medication List reviewed. Glimepiride dose decreased If CKD progresses  ARB will be weaned or discontinued.

## 2021-01-28 NOTE — Patient Instructions (Signed)
See assessment and plan under each diagnosis in the problem list and acutely for this visit 

## 2021-01-28 NOTE — Assessment & Plan Note (Signed)
Current H/H 10.6/32.9 ; post op H/H nadir 6.5/19. S/P 2 u PRCs Indices normocytic, normochromic   ROS negative for bleeding dyscrasias & none reported by Staff Monitor CBC

## 2021-01-28 NOTE — Progress Notes (Signed)
NURSING HOME LOCATION:  [facility] ROOM NUMBER:  [id number]  CODE STATUS:  DNR   PCP:  Alessandra Grout MD  This is a comprehensive admission note to this SNFperformed on this date less than 30 days from date of admission. Included are preadmission medical/surgical history; reconciled medication list; family history; social history and comprehensive review of systems.  Corrections and additions to the records were documented. Comprehensive physical exam was also performed. Additionally a clinical summary was entered for each active diagnosis pertinent to this admission in the Problem List to enhance continuity of care.  HPI: She was hospitalized 8/14 - 01/27/2021 , admitted from home with chest pain.  With the initial episode of chest pain she declined transport to the ED.  The pain recurred and progressed and she relented.  In the ED EKG revealed new ST depressions in the inferior leads.  Troponins peaked at 6571.  Dr. Terrence Dupont was consulted and recommended cardiac catheterization which she initially declined.  Subsequently she did agree; catheterization revealed diffuse vascular lesions with blockage of 70 to 90%.  She refused to consider CABG.  Subsequently she developed recurrent chest pain even at rest and agreed to revascularization. On 8/22 she had CABG x3, LIMA to LAD, reverse saphenous vein graft to the distal right coronary artery, reverse saphenous vein graft to OM1.  Postop she required volume replacement in the context of acute blood loss anemia. She did receive 2 units of packed cells.    Postop she did have some dyspnea; imaging revealed atelectasis and small effusions.  Vascular congestion was also suggested and Lasix was initiated. Course was also complicated by AKI with creatinine was up to 2.03.With fluid resuscitation creatinine improved to 1.35.  She also noted numbness in her hand.  Neurology consulted; the symptoms resolved spontaneously.  Postoperatively she  also had some delirium and confusion which resolved.   Speech therapy consulted because of dysphagia and choking with meals.   She did have an episode of A. fib which converted with IV amiodarone.    Because of bradycardia beta-blocker was not initiated.   She was discharged to the SNF for rehab with PT/OT.  Past medical and surgical history: Includes history of aortic aneurysm, GERD, diabetes with vascular complications, dyslipidemia, hypertension, nephrolithiasis, history of stroke, and PVD. Surgeries and procedures include abdominal aortic aneurysm repair, hysterectomy, CABG,and multiple vascular procedures  Social history: Patient has never drunk alcohol; she has a 20-pack-year history of smoking. She is a retired Theatre manager.  Family history: Family history non contributory due to advanced age   Review of systems: She is alert and oriented.  She describes some exertional dyspnea and persistent fatigue.  She describes some depression as she had to leave her apartment.  She has residual numbness in the left hand.  Constitutional: No fever, significant weight change Eyes: No redness, discharge, pain, vision change ENT/mouth: No nasal congestion, purulent discharge, earache, change in hearing, sore throat  Cardiovascular: No chest pain, palpitations, paroxysmal nocturnal dyspnea, claudication, edema  Respiratory: No cough, sputum production, hemoptysis, significant snoring, apnea Gastrointestinal: No heartburn, dysphagia, abdominal pain, nausea /vomiting, rectal bleeding, melena, change in bowels Genitourinary: No dysuria, hematuria, pyuria, incontinence, nocturia Musculoskeletal: No joint stiffness, joint swelling, weakness, pain Dermatologic: No rash, pruritus, change in appearance of skin Neurologic: No dizziness, headache, syncope, seizures Psychiatric: No significant insomnia, anorexia Endocrine: No change in hair/skin/nails, excessive thirst, excessive hunger, excessive urination   Hematologic/lymphatic: No significant bruising, lymphadenopathy, abnormal bleeding Allergy/immunology: No itchy/watery  eyes, significant sneezing, urticaria, angioedema  Physical exam:  Pertinent or positive findings: She is an intelligent and interactive most pleasant individual.  Hair is disheveled.  There is slight ptosis of the left eye.  She has an upper plate.  The lower teeth are immaculate.  Heart sounds are distant and breath sounds are decreased.  Abdomen is protuberant.  Pedal pulses are decreased.  She has 1+ edema at the sock line.  General appearance: Adequately nourished; no acute distress, increased work of breathing is present.   Lymphatic: No lymphadenopathy about the head, neck, axilla. Eyes: No conjunctival inflammation or lid edema is present. There is no scleral icterus. Ears:  External ear exam shows no significant lesions or deformities.   Nose:  External nasal examination shows no deformity or inflammation. Nasal mucosa are pink and moist without lesions, exudates Oral exam: Lips and gums are healthy appearing.There is no oropharyngeal erythema or exudate. Neck:  No thyromegaly, masses, tenderness noted.    Heart:  No gallop, murmur, click, rub.  Lungs: without wheezes, rhonchi, rales, rubs. Abdomen: Bowel sounds are normal.  Abdomen is soft and nontender with no organomegaly, hernias, masses. GU: Deferred  Extremities:  No cyanosis, clubbing Neurologic exam: Balance, Rhomberg, finger to nose testing could not be completed due to clinical state Skin: Warm & dry w/o tenting. No significant lesions or rash.  See clinical summary under each active problem in the Problem List with associated updated therapeutic plan

## 2021-01-28 NOTE — Assessment & Plan Note (Addendum)
DM with neuro, renal, & vascular complications Glucose range as IP : 98-222  Current A1c: 9.7% A1c goal : < 8% No hypoglycemia Glucose monitor @ SNF;D/C sulfonylurea & initiate insulin if FBS > 200 on average

## 2021-01-30 NOTE — Assessment & Plan Note (Signed)
If BP average >130/90, Beta blocker will be added as presently not bradycardic

## 2021-01-30 NOTE — Assessment & Plan Note (Signed)
She describes persistent fatigue; unlikely to represent anginal variant post CABG

## 2021-01-30 NOTE — Assessment & Plan Note (Signed)
Clinically stable post CABG

## 2021-01-31 ENCOUNTER — Emergency Department (HOSPITAL_COMMUNITY): Payer: Medicare HMO

## 2021-01-31 ENCOUNTER — Encounter (HOSPITAL_COMMUNITY): Payer: Self-pay

## 2021-01-31 ENCOUNTER — Emergency Department (HOSPITAL_COMMUNITY)
Admission: EM | Admit: 2021-01-31 | Discharge: 2021-01-31 | Disposition: A | Discharge status: Home / Self Care | Payer: Medicare HMO | Attending: Emergency Medicine | Admitting: Emergency Medicine

## 2021-01-31 DIAGNOSIS — Z7982 Long term (current) use of aspirin: Secondary | ICD-10-CM | POA: Insufficient documentation

## 2021-01-31 DIAGNOSIS — M549 Dorsalgia, unspecified: Secondary | ICD-10-CM | POA: Diagnosis not present

## 2021-01-31 DIAGNOSIS — Z96642 Presence of left artificial hip joint: Secondary | ICD-10-CM | POA: Diagnosis not present

## 2021-01-31 DIAGNOSIS — R609 Edema, unspecified: Secondary | ICD-10-CM

## 2021-01-31 DIAGNOSIS — Z951 Presence of aortocoronary bypass graft: Secondary | ICD-10-CM | POA: Insufficient documentation

## 2021-01-31 DIAGNOSIS — Z7902 Long term (current) use of antithrombotics/antiplatelets: Secondary | ICD-10-CM | POA: Insufficient documentation

## 2021-01-31 DIAGNOSIS — J90 Pleural effusion, not elsewhere classified: Secondary | ICD-10-CM | POA: Diagnosis not present

## 2021-01-31 DIAGNOSIS — Z96652 Presence of left artificial knee joint: Secondary | ICD-10-CM | POA: Diagnosis not present

## 2021-01-31 DIAGNOSIS — I129 Hypertensive chronic kidney disease with stage 1 through stage 4 chronic kidney disease, or unspecified chronic kidney disease: Secondary | ICD-10-CM | POA: Diagnosis not present

## 2021-01-31 DIAGNOSIS — Z7984 Long term (current) use of oral hypoglycemic drugs: Secondary | ICD-10-CM | POA: Diagnosis not present

## 2021-01-31 DIAGNOSIS — E1122 Type 2 diabetes mellitus with diabetic chronic kidney disease: Secondary | ICD-10-CM | POA: Diagnosis not present

## 2021-01-31 DIAGNOSIS — R600 Localized edema: Secondary | ICD-10-CM | POA: Diagnosis not present

## 2021-01-31 DIAGNOSIS — N183 Chronic kidney disease, stage 3 unspecified: Secondary | ICD-10-CM | POA: Diagnosis not present

## 2021-01-31 DIAGNOSIS — R0789 Other chest pain: Secondary | ICD-10-CM | POA: Diagnosis not present

## 2021-01-31 DIAGNOSIS — I25119 Atherosclerotic heart disease of native coronary artery with unspecified angina pectoris: Secondary | ICD-10-CM | POA: Diagnosis not present

## 2021-01-31 DIAGNOSIS — Z87891 Personal history of nicotine dependence: Secondary | ICD-10-CM | POA: Insufficient documentation

## 2021-01-31 DIAGNOSIS — I10 Essential (primary) hypertension: Secondary | ICD-10-CM | POA: Diagnosis not present

## 2021-01-31 DIAGNOSIS — J9811 Atelectasis: Secondary | ICD-10-CM | POA: Diagnosis not present

## 2021-01-31 DIAGNOSIS — R079 Chest pain, unspecified: Secondary | ICD-10-CM | POA: Diagnosis not present

## 2021-01-31 DIAGNOSIS — R069 Unspecified abnormalities of breathing: Secondary | ICD-10-CM | POA: Diagnosis not present

## 2021-01-31 DIAGNOSIS — Z79899 Other long term (current) drug therapy: Secondary | ICD-10-CM | POA: Insufficient documentation

## 2021-01-31 LAB — TROPONIN I (HIGH SENSITIVITY)
Troponin I (High Sensitivity): 34 ng/L — ABNORMAL HIGH (ref <18)
Troponin I (High Sensitivity): 34 ng/L — ABNORMAL HIGH (ref <18)

## 2021-01-31 LAB — CBC WITH DIFFERENTIAL/PLATELET
Abs Immature Granulocytes: 0.05 10*3/uL (ref 0.00–0.07)
Basophils Absolute: 0.1 10*3/uL (ref 0.0–0.1)
Basophils Relative: 1 %
Eosinophils Absolute: 0.5 10*3/uL (ref 0.0–0.5)
Eosinophils Relative: 5 %
HCT: 33.6 % — ABNORMAL LOW (ref 36.0–46.0)
Hemoglobin: 10.7 g/dL — ABNORMAL LOW (ref 12.0–15.0)
Immature Granulocytes: 1 %
Lymphocytes Relative: 16 %
Lymphs Abs: 1.7 10*3/uL (ref 0.7–4.0)
MCH: 29.1 pg (ref 26.0–34.0)
MCHC: 31.8 g/dL (ref 30.0–36.0)
MCV: 91.3 fL (ref 80.0–100.0)
Monocytes Absolute: 0.7 10*3/uL (ref 0.1–1.0)
Monocytes Relative: 7 %
Neutro Abs: 7.3 10*3/uL (ref 1.7–7.7)
Neutrophils Relative %: 70 %
Platelets: 187 10*3/uL (ref 150–400)
RBC: 3.68 MIL/uL — ABNORMAL LOW (ref 3.87–5.11)
RDW: 14.9 % (ref 11.5–15.5)
WBC: 10.3 10*3/uL (ref 4.0–10.5)
nRBC: 0 % (ref 0.0–0.2)

## 2021-01-31 LAB — BASIC METABOLIC PANEL
Anion gap: 8 (ref 5–15)
BUN: 16 mg/dL (ref 8–23)
CO2: 26 mmol/L (ref 22–32)
Calcium: 9.2 mg/dL (ref 8.9–10.3)
Chloride: 103 mmol/L (ref 98–111)
Creatinine, Ser: 1.61 mg/dL — ABNORMAL HIGH (ref 0.44–1.00)
GFR, Estimated: 32 mL/min — ABNORMAL LOW (ref >60)
Glucose, Bld: 107 mg/dL — ABNORMAL HIGH (ref 70–99)
Potassium: 4.8 mmol/L (ref 3.5–5.1)
Sodium: 137 mmol/L (ref 135–145)

## 2021-01-31 MED ORDER — FUROSEMIDE 10 MG/ML IJ SOLN
40.0000 mg | Freq: Once | INTRAMUSCULAR | Status: AC
  Administered 2021-01-31: 40 mg via INTRAVENOUS
  Filled 2021-01-31: qty 4

## 2021-01-31 NOTE — ED Notes (Signed)
Paperwork provided to [employer].

## 2021-01-31 NOTE — ED Triage Notes (Signed)
Open heart surgery last week and has been in [facility] since. Complains of mid chest pain radiating to both shoulders and to her back. Aspirin 324mg  and 1 SL Nitro given by EMS. Alert and oriented x 4.

## 2021-01-31 NOTE — ED Provider Notes (Signed)
[facility] EMERGENCY DEPARTMENT Provider Note   CSN: [id number] Arrival date & time: 01/31/21  1214     History Chief Complaint  Patient presents with   Chest Pain    [name] is a 80 y.o. female.   Chest Pain Associated symptoms: back pain   Associated symptoms: no shortness of breath and no weakness   Patient presents with chest pain.  Began today.  Has been somewhat improving now but began acutely.  In the mid chest going towards his back.  Both shoulders.  Did have recent CABG for multivessel coronary artery disease.  Is currently at [facility] for the same.  States she is also having more swelling in her legs.  States she does have some anterior chest pain that she attributes to the surgical site.  States that is unchanged.  States this will extend both of her legs.  States she was not really having pain when she had the heart issues.  No fevers.  No coughing.    Past Medical History:  Diagnosis Date   AAA (abdominal aortic aneurysm) ([employer])    Aneurysm (HCC)    Arthritis    osteoarthritis   Constipation    Diabetes mellitus    Type 2, on Glimiperide   GERD (gastroesophageal reflux disease)    Glaucoma    Hyperlipidemia    Hypertension    Kidney stones    Myocardial infarction [facility])    Peripheral vascular disease ([place])    Pneumonia    Stroke ([place]) 01/2015   with left side weakness    TIA (transient ischemic attack)    Urinary incontinence     Patient Active Problem List   Diagnosis Date Noted   Acute blood loss anemia 01/28/2021   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   NSTEMI (non-ST elevated myocardial infarction) ([place]) 01/10/2021   CAD (coronary artery disease) 01/10/2021   CVA (cerebral vascular accident) ([place]) 10/11/2019   Hypertensive urgency 04/07/2019   CKD (chronic kidney disease) stage 3, GFR 30-59 ml/min ([place]) 04/07/2019   Chest pain 04/28/2017   History of ST elevation myocardial infarction (STEMI) 04/28/2017   Grief reaction 04/28/2017    Osteoarthritis of left hip 05/18/2015   Status post total replacement of left hip 05/18/2015   Coronary artery disease involving native coronary artery of native heart with angina pectoris ([place]) 04/16/2015   History of stroke 04/16/2015   Type 2 diabetes mellitus with circulatory disorder ([place]) 04/16/2015   Cerebrovascular 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 artery ([place]) 04/16/2015   HLD (hyperlipidemia)    Acute CVA (cerebrovascular accident) ([place]) 02/19/2015   Transient ischemic attack (TIA) 02/18/2015   Stroke ([place])    Cerebral infarction due to thrombosis of right middle cerebral artery (HCC)    Acute myocardial infarction of inferolateral wall ([place]) 04/12/2014   Numbness 03/18/2014   TIA (transient ischemic attack) 03/18/2014   DM (diabetes mellitus) type II uncontrolled, periph vascular disorder ([place]) 03/18/2014   Hyperlipidemia    Hypertension    PVD (peripheral vascular disease) ([place]) 03/05/2014   Transient cerebral ischemia 08/01/2012   Atherosclerosis of native arteries of extremity with intermittent claudication ([place]) 10/19/2011   Unspecified disorders of arteries and arterioles 10/19/2011   PAD (peripheral artery disease) ([place]) 04/13/2011    Past Surgical History:  Procedure Laterality Date   ABDOMINAL AORTAGRAM N/A 03/13/2014   Procedure: ABDOMINAL Maxcine Ham;  Surgeon: Elam Dutch, MD;  Location: [facility] CATH LAB;  Service: Cardiovascular;  Laterality: N/A;  ABDOMINAL AORTIC ANEURYSM REPAIR  ?2012   ABDOMINAL HYSTERECTOMY     partial   BACK SURGERY     CARDIAC CATHETERIZATION     CORONARY ANGIOPLASTY  03/30/2015   CORONARY ARTERY BYPASS GRAFT N/A 01/17/2021   Procedure: CORONARY ARTERY BYPASS GRAFTING (CABG) TIMES 3 , ON PUMP, USING LEFT INTERNAL MAMMARY ARTERY AND ENDOSCOPICALLY HARVESTED RIGHT GREATER SAPHENOUS VEIN;  Surgeon: Lajuana Matte, MD;  Location: [place];  Service: Open Heart Surgery;  Laterality: N/A;   ENDOVEIN HARVEST OF  GREATER SAPHENOUS VEIN Right 01/17/2021   Procedure: ENDOVEIN HARVEST OF GREATER SAPHENOUS VEIN;  Surgeon: Lajuana Matte, MD;  Location: [place];  Service: Open Heart Surgery;  Laterality: Right;   EYE SURGERY Right    laser surgery for blood behind eye, loss of sight   FEMORAL-POPLITEAL BYPASS GRAFT Left 07/06/2014   Procedure: BYPASS GRAFT FEMORAL-POPLITEAL ARTERY;  Surgeon: Elam Dutch, MD;  Location: [facility] OR;  Service: Vascular;  Laterality: Left;   HERNIA REPAIR     JOINT REPLACEMENT     LEFT HEART CATH AND CORONARY ANGIOGRAPHY N/A 01/11/2021   Procedure: LEFT HEART CATH AND CORONARY ANGIOGRAPHY;  Surgeon: [name], MD;  Location: [place] CV LAB;  Service: Cardiovascular;  Laterality: N/A;   LEFT HEART CATHETERIZATION WITH CORONARY ANGIOGRAM N/A 04/12/2014   Procedure: LEFT HEART CATHETERIZATION WITH CORONARY ANGIOGRAM;  Surgeon: Clent Demark, MD;  Location: [place] CATH LAB;  Service: Cardiovascular;  Laterality: N/A;   PERCUTANEOUS CORONARY STENT INTERVENTION (PCI-S)  04/12/2014   Procedure: PERCUTANEOUS CORONARY STENT INTERVENTION (PCI-S);  Surgeon: Clent Demark, MD;  Location: [facility] C[facility] LAB;  Service: Cardiovascular;;  prox and mid RCA   PERIPHERAL VASCULAR INTERVENTION  06/25/2019   Procedure: PERIPHERAL VASCULAR INTERVENTION;  Surgeon: Elam Dutch, MD;  Location: [place] CV LAB;  Service: Cardiovascular;;  Lt Renal and Rt Renal   RENAL ANGIOGRAPHY Bilateral 06/25/2019   Procedure: RENAL ANGIOGRAPHY;  Surgeon: Elam Dutch, MD;  Location: [place] CV LAB;  Service: Cardiovascular;  Laterality: Bilateral;   TEE WITHOUT CARDIOVERSION N/A 01/17/2021   Procedure: TRANSESOPHAGEAL ECHOCARDIOGRAM (TEE);  Surgeon: Lajuana Matte, MD;  Location: [place];  Service: Open Heart Surgery;  Laterality: N/A;   TOTAL HIP ARTHROPLASTY Left 05/18/2015   TOTAL HIP ARTHROPLASTY Left 05/18/2015   Procedure: LEFT TOTAL HIP ARTHROPLASTY ANTERIOR APPROACH;  Surgeon:  Mcarthur Rossetti, MD;  Location: [place];  Service: Orthopedics;  Laterality: Left;   TOTAL KNEE ARTHROPLASTY Left ~ 2003   TUBAL LIGATION     uterine tumor     VENTRAL HERNIA REPAIR       OB History   No obstetric history on file.     Family History  Problem Relation Age of Onset   Diabetes Mother    Stroke Mother    Hypertension Father    Stroke Father    Hyperlipidemia Sister    Hypertension Sister    Aneurysm Sister    Hyperlipidemia Brother    Hypertension Brother     Social History   Tobacco Use   Smoking status: Former    Packs/day: 0.50    Years: 40.00    Pack years: 20.00    Types: Cigarettes   Smokeless tobacco: Never  Vaping Use   Vaping Use: Never used  Substance Use Topics   Alcohol use: No    Alcohol/week: 0.0 standard drinks   Drug use: No    Home Medications Prior to Admission medications  Medication Sig Start Date End Date Taking? Authorizing Provider  acetaminophen (TYLENOL) 650 MG CR tablet Take 650 mg by mouth at bedtime as needed for pain.     [provider]  albuterol (PROVENTIL HFA;VENTOLIN HFA) 108 (90 Base) MCG/ACT inhaler Inhale 2 puffs into the lungs every 4 (four) hours as needed for wheezing or shortness of breath (or coughing). 5/63/89   Delora Fuel, MD  amLODipine (NORVASC) 5 MG tablet Take 5 mg by mouth daily. 09/01/19   [provider]  aspirin EC 81 MG EC tablet Take 1 tablet (81 mg total) by mouth daily. Swallow whole. 01/28/21   Antony Odea, PA-C  blood glucose meter kit and supplies KIT Dispense based on patient and insurance preference. Use up to four times daily as directed. (FOR ICD-9 250.00, 250.01). Patient taking differently: Inject 1 each into the skin See admin instructions. Dispense based on patient and insurance preference. Use up to four times daily as directed. (FOR ICD-9 250.00, 250.01). 10/15/19   Georgette Shell, MD  Blood Glucose Monitoring Suppl (BAYER BREEZE 2 SYSTEM) w/Device  KIT 1 each by Does not apply route 2 (two) times daily. 10/15/19   Georgette Shell, MD  busPIRone (BUSPAR) 5 MG tablet Take 1 tablet (5 mg total) by mouth 2 (two) times daily. Patient not taking: No sig reported 10/15/19   Georgette Shell, MD  clopidogrel (PLAVIX) 75 MG tablet Take 1 tablet (75 mg total) by mouth daily. 01/28/21   Antony Odea, PA-C  furosemide (LASIX) 40 MG tablet Take 1 tablet (40 mg total) by mouth daily for 7 days. 01/28/21 02/04/21  Antony Odea, PA-C  glimepiride (AMARYL) 2 MG tablet Take 1 tablet (2 mg total) by mouth every morning. 10/15/19 01/10/21  Georgette Shell, MD  isosorbide mononitrate (IMDUR) 30 MG 24 hr tablet Take 1 tablet (30 mg total) by mouth daily. 01/28/21   Antony Odea, PA-C  losartan (COZAAR) 25 MG tablet Take 1 tablet (25 mg total) by mouth daily. 01/28/21   Antony Odea, PA-C  pantoprazole (PROTONIX) 40 MG tablet Take 1 tablet (40 mg total) by mouth daily. 01/28/21   Antony Odea, PA-C  potassium chloride SA (KLOR-CON) 20 MEQ tablet Take 1 tablet (20 mEq total) by mouth daily for 7 days. 01/28/21 02/04/21  Antony Odea, PA-C  rosuvastatin (CRESTOR) 10 MG tablet Take 1 tablet (10 mg total) by mouth at bedtime. 01/27/21   Antony Odea, PA-C  varenicline (CHANTIX) 0.5 MG tablet Take 1 tablet (0.5 mg total) by mouth 2 (two) times daily. 01/20/21   Kipp Brood, MD  vitamin B-12 (CYANOCOBALAMIN) 250 MCG tablet Take 1 tablet (250 mcg total) by mouth daily. 01/28/21   Antony Odea, PA-C    Allergies    Keflex [cephalexin], Lipitor [atorvastatin], Codeine, Hydrochlorothiazide, Hydrocodone-acetaminophen, Metformin hcl, Tramadol hcl, Azor [amlodipine-olmesartan], Lisinopril, and Penicillins  Review of Systems   Review of Systems  Constitutional:  Negative for appetite change.  HENT:  Negative for congestion.   Respiratory:  Negative for shortness of breath.   Cardiovascular:  Positive for chest pain and  leg swelling.  Genitourinary:  Negative for flank pain.  Musculoskeletal:  Positive for back pain.  Neurological:  Negative for weakness.   Physical Exam Updated Vital Signs BP (!) 149/66   Pulse 89   Temp 97.8 F (36.6 C) (Oral)   Resp 20   Ht '5\' 7"'  (1.702 m)   Wt 77.6 kg  SpO2 93%   BMI 26.78 kg/m   Physical Exam Vitals and nursing note reviewed.  Cardiovascular:     Rate and Rhythm: Normal rate and regular rhythm.  Pulmonary:     Breath sounds: Rales present. No wheezing or rhonchi.  Chest:     Comments: Healing wound on anterior chest from previous median sternotomy. Abdominal:     Tenderness: There is no abdominal tenderness.  Musculoskeletal:     Right lower leg: Edema present.     Left lower leg: Edema present.     Comments: Pitting edema bilateral lower extremities.  Skin:    Capillary Refill: Capillary refill takes less than 2 seconds.  Neurological:     Mental Status: She is alert.    ED Results / Procedures / Treatments   Labs (all labs ordered are listed, but only abnormal results are displayed) Labs Reviewed  CBC WITH DIFFERENTIAL/PLATELET - Abnormal; Notable for the following components:      Result Value   RBC 3.68 (*)    Hemoglobin 10.7 (*)    HCT 33.6 (*)    All other components within normal limits  BASIC METABOLIC PANEL - Abnormal; Notable for the following components:   Glucose, Bld 107 (*)    Creatinine, Ser 1.61 (*)    GFR, Estimated 32 (*)    All other components within normal limits  TROPONIN I (HIGH SENSITIVITY) - Abnormal; Notable for the following components:   Troponin I (High Sensitivity) 34 (*)    All other components within normal limits  TROPONIN I (HIGH SENSITIVITY) - Abnormal; Notable for the following components:   Troponin I (High Sensitivity) 34 (*)    All other components within normal limits    EKG EKG Interpretation  Date/Time:  Monday January 31 2021 12:25:59 EDT Ventricular Rate:  85 PR Interval:  150 QRS  Duration: 79 QT Interval:  395 QTC Calculation: 470 R Axis:   71 Text Interpretation: Sinus rhythm Abnrm T, probable ischemia, anterolateral lds similar to prior Confirmed by Davonna Belling [phone]) on 01/31/2021 12:30:47 PM  Radiology DG Chest Portable 1 View  Result Date: 01/31/2021 CLINICAL DATA:  Chest pain for 1 week post CABG EXAM: PORTABLE CHEST 1 VIEW COMPARISON:  Portable exam 1335 hours compared to 01/27/2021 FINDINGS: Upper normal heart size post CABG. Mediastinal contours and pulmonary vascularity normal. Bibasilar atelectasis and small pleural effusions, slightly decreased from previous exam. No focal consolidation or pneumothorax. Bones demineralized. IMPRESSION: Bibasilar pleural effusions and atelectasis, slightly improved from 01/27/2021 Electronically Signed   By: Lavonia Dana M.D.   On: 01/31/2021 14:01    Procedures Procedures   Medications Ordered in ED Medications  furosemide (LASIX) injection 40 mg (40 mg Intravenous Given 01/31/21 1607)    ED Course  I have reviewed the triage vital signs and the nursing notes.  Pertinent labs & imaging results that were available during my care of the patient were reviewed by me and considered in my medical decision making (see chart for details).    MDM Rules/Calculators/A&P                           Patient with chest pain.  Recent CABG.  EKG stable.  Troponin stable at 34.  Likely residual from recent CABG.  Chest x-ray reassuring.  Does have edema in the legs and creatinine mildly elevated from baseline although it has been recently more elevated.  Discussed with Dr. Terrence Dupont, who is familiar with  the patient.  Recommends 40 mg of IV Lasix.  We will follow-up with her in the office in the next couple days.  Call office tomorrow for appointment.  I think patient appears stable for discharge.  Doubt acute STEMI.  Discharge home. Final Clinical Impression(s) / ED Diagnoses Final diagnoses:  Nonspecific chest pain  Peripheral  edema    Rx / DC Orders ED Discharge Orders     None        Davonna Belling, MD 01/31/21 1713

## 2021-01-31 NOTE — Discharge Instructions (Signed)
Call Dr. Zenia Resides office tomorrow for an appointment in the next couple days.

## 2021-02-01 ENCOUNTER — Telehealth (HOSPITAL_COMMUNITY): Payer: Self-pay

## 2021-02-01 NOTE — Telephone Encounter (Signed)
Pt insurance is active and benefits verified through [employer] $10, DED 0/0 met, out of pocket $3,900/$724.03 met, co-insurance 0%. no pre-authorization required. Passport, 02/01/2021'@9' :11am, REF# [id number]   Will contact patient to see if she is interested in the Cardiac Rehab Program. If interested, patient will need to complete follow up appt. Once completed, patient will be contacted for scheduling upon review by the RN Navigator.

## 2021-02-04 ENCOUNTER — Other Ambulatory Visit: Payer: Self-pay

## 2021-02-04 ENCOUNTER — Telehealth: Payer: Self-pay | Admitting: Thoracic Surgery (Cardiothoracic Vascular Surgery)

## 2021-02-08 DIAGNOSIS — I10 Essential (primary) hypertension: Secondary | ICD-10-CM | POA: Diagnosis not present

## 2021-02-08 DIAGNOSIS — E119 Type 2 diabetes mellitus without complications: Secondary | ICD-10-CM | POA: Diagnosis not present

## 2021-02-08 DIAGNOSIS — I714 Abdominal aortic aneurysm, without rupture: Secondary | ICD-10-CM | POA: Diagnosis not present

## 2021-02-08 DIAGNOSIS — I739 Peripheral vascular disease, unspecified: Secondary | ICD-10-CM | POA: Diagnosis not present

## 2021-02-08 DIAGNOSIS — I2510 Atherosclerotic heart disease of native coronary artery without angina pectoris: Secondary | ICD-10-CM | POA: Diagnosis not present

## 2021-02-08 DIAGNOSIS — N189 Chronic kidney disease, unspecified: Secondary | ICD-10-CM | POA: Diagnosis not present

## 2021-02-08 DIAGNOSIS — E785 Hyperlipidemia, unspecified: Secondary | ICD-10-CM | POA: Diagnosis not present

## 2021-02-14 ENCOUNTER — Encounter: Payer: Self-pay | Admitting: Adult Health

## 2021-02-14 ENCOUNTER — Non-Acute Institutional Stay (SKILLED_NURSING_FACILITY): Payer: Medicare HMO | Admitting: Adult Health

## 2021-02-14 DIAGNOSIS — I10 Essential (primary) hypertension: Secondary | ICD-10-CM

## 2021-02-14 DIAGNOSIS — R634 Abnormal weight loss: Secondary | ICD-10-CM | POA: Diagnosis not present

## 2021-02-14 NOTE — Progress Notes (Signed)
Location:  [facility] Room Number: 307 [name]:  SNF (31) Provider:  Durenda Age, DNP, FNP-BC  Patient Care Team: [name], MD as PCP - General (Family Medicine) [name], MD as Attending Physician (Internal Medicine) Mcarthur Rossetti, MD as Consulting Physician (Orthopedic Surgery) Lbcardiology, Rounding, MD (Cardiology)  Extended Emergency Contact Information Primary Emergency Contact: [place] Work Phone: [phone] Mobile Phone: [phone] Relation: Daughter  Code Status:  DNR  Goals of care: Advanced Directive information Advanced Directives 02/14/2021  Does Patient Have a Medical Advance Directive? Yes  Type of Paramedic of [place];Living will  Does patient want to make changes to medical advance directive? No - Patient declined  Copy of [place] in Chart? Yes - validated most recent copy scanned in chart (See row information)  Would patient like information on creating a medical advance directive? -     Chief Complaint  Patient presents with   Acute Visit    Weight loss    HPI:  Pt is a 80 y.o. female seen today for an acute visit regarding weight loss of 6.2% in 7 days, 10.4 lbs. SBPs ranging from 130-165.  She takes Norvasc 5 mg daily and losartan 25 mg daily for hypertension.  She is currently having short-term rehabilitation.  She was recently hospitalized 01/09/2021 to 01/27/21 for chest pain.  She was seen by cardiologist, Dr. Terrence Dupont, and cardiac catheterization was performed which revealed diffuse vascular lesions with blockage of 70 to 90%.  On 8/22, she had CABG x3, LIMA to LAD, reverse saphenous vein graft to the distal right coronary artery, reverse saphenous vein graft to OM1.  Post op, she was transfused 2 units PRBC due to acute blood loss.  She had congestion..  Imaging revealed atelectasis and small effusions.   Lasix was started.  Hospitalization was complicated by AKI with creatinine up to 2.03.  She was given IV fluids and creatinine improved to two 1.35.  She had numbness on her hands.  Neurology was consulted.  Symptoms resolved spontaneously.  She had an episode of atrial fibrillation and was given IV amiodarone.  She had bradycardia so beta-blocker was not initiated.   Past Medical History:  Diagnosis Date   AAA (abdominal aortic aneurysm) ([place])    Aneurysm (HCC)    Arthritis    osteoarthritis   Constipation    Diabetes mellitus    Type 2, on Glimiperide   GERD (gastroesophageal reflux disease)    Glaucoma    Hyperlipidemia    Hypertension    Kidney stones    Myocardial infarction [facility])    Peripheral vascular disease ([place])    Pneumonia    Stroke ([place]) 01/2015   with left side weakness    TIA (transient ischemic attack)    Urinary incontinence    Past Surgical History:  Procedure Laterality Date   ABDOMINAL AORTAGRAM N/A 03/13/2014   Procedure: ABDOMINAL Maxcine Ham;  Surgeon: Elam Dutch, MD;  Location: [facility] CATH LAB;  Service: Cardiovascular;  Laterality: N/A;   ABDOMINAL AORTIC ANEURYSM REPAIR  ?2012   ABDOMINAL HYSTERECTOMY     partial   BACK SURGERY     CARDIAC CATHETERIZATION     CORONARY ANGIOPLASTY  03/30/2015   CORONARY ARTERY BYPASS GRAFT N/A 01/17/2021   Procedure: CORONARY ARTERY BYPASS GRAFTING (CABG) TIMES 3 , ON PUMP, USING LEFT INTERNAL MAMMARY ARTERY AND ENDOSCOPICALLY HARVESTED RIGHT GREATER SAPHENOUS VEIN;  Surgeon: Melodie Bouillon  O, MD;  Location: [facility];  Service: Open Heart Surgery;  Laterality: N/A;   ENDOVEIN HARVEST OF GREATER SAPHENOUS VEIN Right 01/17/2021   Procedure: ENDOVEIN HARVEST OF GREATER SAPHENOUS VEIN;  Surgeon: Lajuana Matte, MD;  Location: [facility];  Service: Open Heart Surgery;  Laterality: Right;   EYE SURGERY Right    laser surgery for blood behind eye, loss of sight   FEMORAL-POPLITEAL BYPASS GRAFT Left 07/06/2014   Procedure:  BYPASS GRAFT FEMORAL-POPLITEAL ARTERY;  Surgeon: Elam Dutch, MD;  Location[facility] OR;  Service: Vascular;  Laterality: Left;   HERNIA REPAIR     JOINT REPLACEMENT     LEFT HEART CATH AND CORONARY ANGIOGRAPHY N/A 01/11/2021   Procedure: LEFT HEART CATH AND CORONARY ANGIOGRAPHY;  Surgeon: [name], MD;  Location: [facility] CV LAB;  Service: Cardiovascular;  Laterality: N/A;   LEFT HEART CATHETERIZATION WITH CORONARY ANGIOGRAM N/A 04/12/2014   Procedure: LEFT HEART CATHETERIZATION WITH CORONARY ANGIOGRAM;  Surgeon: Clent Demark, MD;  Location: [facility] CATH LAB;  Service: Cardiovascular;  Laterality: N/A;   PERCUTANEOUS CORONARY STENT INTERVENTION (PCI-S)  04/12/2014   Procedure: PERCUTANEOUS CORONARY STENT INTERVENTION (PCI-S);  Surgeon: Clent Demark, MD;  Location: [facility] CATH LAB;  Service: Cardiovascular;;  prox and mid RCA   PERIPHERAL VASCULAR INTERVENTION  06/25/2019   Procedure: PERIPHERAL VASCULAR INTERVENTION;  Surgeon: Elam Dutch, MD;  Location: [place] CV LAB;  Service: Cardiovascular;;  Lt Renal and Rt Renal   RENAL ANGIOGRAPHY Bilateral 06/25/2019   Procedure: RENAL ANGIOGRAPHY;  Surgeon: Elam Dutch, MD;  Location: [place] CV LAB;  Service: Cardiovascular;  Laterality: Bilateral;   TEE WITHOUT CARDIOVERSION N/A 01/17/2021   Procedure: TRANSESOPHAGEAL ECHOCARDIOGRAM (TEE);  Surgeon: Lajuana Matte, MD;  Location: [facility];  Service: Open Heart Surgery;  Laterality: N/A;   TOTAL HIP ARTHROPLASTY Left 05/18/2015   TOTAL HIP ARTHROPLASTY Left 05/18/2015   Procedure: LEFT TOTAL HIP ARTHROPLASTY ANTERIOR APPROACH;  Surgeon: Mcarthur Rossetti, MD;  Location: [facility];  Service: Orthopedics;  Laterality: Left;   TOTAL KNEE ARTHROPLASTY Left ~ 2003   TUBAL LIGATION     uterine tumor     VENTRAL HERNIA REPAIR      Allergies  Allergen Reactions   Keflex [Cephalexin] Shortness Of Breath and Other (See Comments)    Chest pain, headache   Lipitor  [Atorvastatin] Shortness Of Breath and Other (See Comments)    Severe pain/cramping in legs- "I cannot walk, talk, or breathe" (pt is currently taking 20 mg daily 10/11/19)   Codeine Other (See Comments)    Disrupted patient's equilibrium- "Made my world flip upside down"   Hydrochlorothiazide Other (See Comments)    Other reaction(s): lightheaded, nausea   Hydrocodone-Acetaminophen Other (See Comments)    Other reaction(s): "get crazy"   Metformin Hcl Other (See Comments)    Other reaction(s): upset stomach   Tramadol Hcl Other (See Comments)    Other reaction(s): "get crazy"   Azor [Amlodipine-Olmesartan] Swelling, Palpitations and Rash    Amlodipine alone resumed 12/2013, tolerating   Lisinopril Cough   Penicillins Dermatitis and Other (See Comments)    Did it involve swelling of the face/tongue/throat, SOB, or low BP? No, just worsened eczema Did it involve sudden or severe rash/hives, skin peeling, or any reaction on the inside of your mouth or nose? No Did you need to seek medical attention at a hospital or doctor's office? No When did it last happen? "More than 10 years ago" If  all above answers are "NO", may proceed with cephalosporin use.     Outpatient Encounter Medications as of 02/14/2021  Medication Sig   acetaminophen (TYLENOL) 650 MG CR tablet Take 650 mg by mouth at bedtime as needed for pain.    albuterol (PROVENTIL HFA;VENTOLIN HFA) 108 (90 Base) MCG/ACT inhaler Inhale 2 puffs into the lungs every 4 (four) hours as needed for wheezing or shortness of breath (or coughing).   amLODipine (NORVASC) 5 MG tablet Take 5 mg by mouth daily.   aspirin EC 81 MG EC tablet Take 1 tablet (81 mg total) by mouth daily. Swallow whole.   bisacodyl (DULCOLAX) 10 MG suppository Place 10 mg rectally as needed for moderate constipation.   busPIRone (BUSPAR) 5 MG tablet Take 1 tablet (5 mg total) by mouth 2 (two) times daily.   clopidogrel (PLAVIX) 75 MG tablet Take 1 tablet (75 mg total) by  mouth daily.   feeding supplement, GLUCERNA SHAKE, (GLUCERNA SHAKE) LIQD Take 237 mLs by mouth 2 (two) times daily between meals.   glimepiride (AMARYL) 2 MG tablet Take 1 tablet (2 mg total) by mouth every morning.   isosorbide mononitrate (IMDUR) 30 MG 24 hr tablet Take 1 tablet (30 mg total) by mouth daily.   losartan (COZAAR) 25 MG tablet Take 1 tablet (25 mg total) by mouth daily.   Magnesium Hydroxide (MILK OF MAGNESIA PO) Take 30 mLs by mouth as needed.   pantoprazole (PROTONIX) 40 MG tablet Take 1 tablet (40 mg total) by mouth daily.   rosuvastatin (CRESTOR) 10 MG tablet Take 1 tablet (10 mg total) by mouth at bedtime.   Sodium Phosphates (RA SALINE ENEMA RE) Place 1 Dose rectally as needed.   varenicline (CHANTIX) 0.5 MG tablet Take 1 tablet (0.5 mg total) by mouth 2 (two) times daily.   vitamin B-12 (CYANOCOBALAMIN) 250 MCG tablet Take 1 tablet (250 mcg total) by mouth daily.   furosemide (LASIX) 40 MG tablet Take 1 tablet (40 mg total) by mouth daily for 7 days.   potassium chloride SA (KLOR-CON) 20 MEQ tablet Take 1 tablet (20 mEq total) by mouth daily for 7 days.   [DISCONTINUED] blood glucose meter kit and supplies KIT Dispense based on patient and insurance preference. Use up to four times daily as directed. (FOR ICD-9 250.00, 250.01). (Patient taking differently: Inject 1 each into the skin See admin instructions. Dispense based on patient and insurance preference. Use up to four times daily as directed. (FOR ICD-9 250.00, 250.01).)   [DISCONTINUED] Blood Glucose Monitoring Suppl (BAYER BREEZE 2 SYSTEM) w/Device KIT 1 each by Does not apply route 2 (two) times daily.   No facility-administered encounter medications on file as of 02/14/2021.    Review of Systems  GENERAL: No change in appetite, no fatigue, no weight changes, no fever or chills  MOUTH and THROAT: Denies oral discomfort, gingival pain or bleeding, RESPIRATORY: no cough, SOB, DOE, wheezing, hemoptysis CARDIAC: No  chest pain, edema or palpitations GI: No abdominal pain, diarrhea, constipation, heart burn, nausea or vomiting GU: Denies dysuria, frequency, hematuria, incontinence, or discharge NEUROLOGICAL: Denies dizziness, syncope, numbness, or headache PSYCHIATRIC: Denies feelings of depression or anxiety. No report of hallucinations, insomnia, paranoia, or agitation   Immunization History  Administered Date(s) Administered   PFIZER(Purple Top)SARS-COV-2 Vaccination 07/24/2019, 08/19/2019   Pneumococcal Conjugate-13 02/26/2015   Pneumococcal Polysaccharide-23 04/19/2010, 05/01/2016   Td 07/22/2010   Pertinent  Health Maintenance Due  Topic Date Due   FOOT EXAM  Never done  OPHTHALMOLOGY EXAM  Never done   DEXA SCAN  Never done   INFLUENZA VACCINE  Never done   HEMOGLOBIN A1C  07/20/2021   Fall Risk  04/16/2015  Falls in the past year? Yes  Number falls in past yr: 1  Injury with Fall? No  Risk for fall due to : Impaired balance/gait     Vitals:   02/14/21 1150  BP: 132/75  Pulse: 84  Resp: 18  Temp: 97.7 F (36.5 C)  Height: 5' 7" (1.702 m)   Body mass index is 26.78 kg/m.  Physical Exam  GENERAL APPEARANCE: Well nourished. In no acute distress. Normal body habitus SKIN:  Skin is warm and dry.  MOUTH and THROAT: Lips are without lesions. Oral mucosa is moist and without lesions.  RESPIRATORY: Breathing is even & unlabored, BS CTAB CARDIAC: RRR, no murmur,no extra heart sounds, no edema GI: Abdomen soft, normal BS, no masses, no tenderness, EXTREMITIES: Able to move x4 extremities NEUROLOGICAL: There is no tremor. Speech is clear. Alert and oriented X 3. PSYCHIATRIC:  Affect and behavior are appropriate  Labs reviewed: Recent Labs    01/18/21 1540 01/18/21 1731 01/20/21 0428 01/21/21 0646 01/22/21 0019 01/24/21 0151 01/27/21 0141 01/31/21 1244  NA 139   < > 140 139   < > 139 137 137  K 4.1   < > 4.6 4.2   < > 4.1 4.5 4.8  CL 109   < > 104 107   < > 106 103  103  CO2 18*   < > 24 23   < > _0 GLUCOSE 222*   < > 168* 154*   < > 140* 123* 107*  BUN 13   < > 26* 30*   < > _1 CREATININE 1.53*   < > 2.03* 1.84*   < > 1.38* 1.35* 1.61*  CALCIUM 8.7*   < > 9.1 8.4*   < > 8.4* 8.3* 9.2  MG 3.0*  --  2.9* 2.9*  --   --   --   --    < > = values in this interval not displayed.   No results for input(s): AST, ALT, ALKPHOS, BILITOT, PROT, ALBUMIN in the last 8760 hours. Recent Labs    09/20/20 0827 01/09/21 2230 01/19/21 0322 01/20/21 0428 01/31/21 1244  WBC 6.1   < > 12.8* 12.6* 10.3  NEUTROABS 3.4  --   --   --  7.3  HGB 13.2   < > 10.8* 10.6* 10.7*  HCT 40.6   < > 31.8* 32.9* 33.6*  MCV 91.6   < > 88.1 90.6 91.3  PLT 165   < > 135* 157 187   < > = values in this interval not displayed.   Lab Results  Component Value Date   TSH 1.248 01/20/2021   Lab Results  Component Value Date   HGBA1C 9.1 (H) 01/17/2021   Lab Results  Component Value Date   CHOL 247 (H) 01/10/2021   HDL 39 (L) 01/10/2021   LDLCALC 180 (H) 01/10/2021   TRIG 139 01/10/2021   CHOLHDL 6.3 01/10/2021    Significant Diagnostic Results in last 30 days:  DG Chest 2 View  Result Date: 01/27/2021 CLINICAL DATA:  Status post coronary bypass graft. EXAM: CHEST - 2 VIEW COMPARISON:  January 25, 2021. FINDINGS: Stable cardiomegaly. Status post coronary bypass graft. No pneumothorax is noted. Mild bilateral pulmonary edema is noted with bilateral pleural effusions and  associated atelectasis. Bony thorax is unremarkable. IMPRESSION: Stable cardiomegaly with bilateral pulmonary edema and pleural effusions and associated atelectasis. Electronically Signed   By: Marijo Conception M.D.   On: 01/27/2021 13:10   DG Chest 2 View  Result Date: 01/25/2021 CLINICAL DATA:  Post cardiac surgery 01/17/2021 EXAM: CHEST - 2 VIEW COMPARISON:  01/19/2021 FINDINGS: Enlargement of cardiac silhouette post CABG. Mediastinal contours and pulmonary vascularity normal. Persistent perihilar  infiltrates likely pulmonary edema. Bibasilar pleural effusions and atelectasis greater on RIGHT. No pneumothorax. IMPRESSION: Enlargement of cardiac silhouette with probable mild persistent pulmonary edema post CABG. Bibasilar pleural effusions and atelectasis, slightly greater on RIGHT, not significantly changed. Electronically Signed   By: Lavonia Dana M.D.   On: 01/25/2021 10:40   DG Chest 2 View  Result Date: 01/17/2021 CLINICAL DATA:  Preoperative evaluation for CABG and TEE EXAM: CHEST - 2 VIEW COMPARISON:  Portable exam 0527 hours compared to 01/16/2021 FINDINGS: Normal heart size, mediastinal contours, and pulmonary vascularity. Atherosclerotic calcification aorta. Chronic interstitial prominence unchanged since at least 05/28/2018. Subsegmental atelectasis versus scarring mid RIGHT lung. No definite acute infiltrate, pleural effusion, or pneumothorax. Bones demineralized. IMPRESSION: Chronic interstitial lung disease with minimal atelectasis versus scarring mid RIGHT lung. Aortic Atherosclerosis (ICD10-I70.0). Electronically Signed   By: Lavonia Dana M.D.   On: 01/17/2021 08:12   DG Abd 1 View  Result Date: 01/17/2021 CLINICAL DATA:  OG tube placement EXAM: ABDOMEN - 1 VIEW COMPARISON:  None. FINDINGS: Enteric tube tip is present in the left upper quadrant consistent with location in the body of the stomach. Proximal side hole projects just below the expected location of the EG junction. IMPRESSION: Enteric tube tip is present in the left upper quadrant consistent with location in the body of the stomach. Electronically Signed   By: Lucienne Capers M.D.   On: 01/17/2021 20:32   DG Chest Portable 1 View  Result Date: 01/31/2021 CLINICAL DATA:  Chest pain for 1 week post CABG EXAM: PORTABLE CHEST 1 VIEW COMPARISON:  Portable exam 1335 hours compared to 01/27/2021 FINDINGS: Upper normal heart size post CABG. Mediastinal contours and pulmonary vascularity normal. Bibasilar atelectasis and small  pleural effusions, slightly decreased from previous exam. No focal consolidation or pneumothorax. Bones demineralized. IMPRESSION: Bibasilar pleural effusions and atelectasis, slightly improved from 01/27/2021 Electronically Signed   By: Lavonia Dana M.D.   On: 01/31/2021 14:01   DG Chest Port 1 View  Result Date: 01/19/2021 CLINICAL DATA:  Status post cardiac surgery. EXAM: PORTABLE CHEST 1 VIEW COMPARISON:  January 18, 2021. FINDINGS: Stable cardiomegaly. Right venous sheath is unchanged. No pneumothorax is noted. Bibasilar opacities are noted concerning for atelectasis or edema with associated pleural effusions. Bony thorax is unremarkable. IMPRESSION: Grossly stable bibasilar opacities concerning for atelectasis or edema with associated pleural effusions. Electronically Signed   By: Marijo Conception M.D.   On: 01/19/2021 08:45   DG CHEST PORT 1 VIEW  Result Date: 01/18/2021 CLINICAL DATA:  Respiratory failure, acute EXAM: PORTABLE CHEST 1 VIEW COMPARISON:  01/18/2021 FINDINGS: Layering bilateral effusions. Cardiomegaly, vascular congestion. Bilateral lower lobe airspace opacities, right greater than left. Interval extubation and removal of NG tube. Changes of CABG. Right central line is unchanged. IMPRESSION: Bilateral layering effusions and bibasilar atelectasis or infiltrates. Mild vascular congestion. Electronically Signed   By: Rolm Baptise M.D.   On: 01/18/2021 18:06   DG Chest Port 1 View  Result Date: 01/18/2021 CLINICAL DATA:  Status post cardiac surgery. EXAM: PORTABLE CHEST 1  VIEW COMPARISON:  January 17, 2021. FINDINGS: Stable cardiomediastinal silhouette. Endotracheal and nasogastric tubes are unchanged in position. Right internal jugular catheter is unchanged. Stable pericardial drain. No pneumothorax is noted. Increased bibasilar atelectasis is noted with small bilateral pleural effusions. Bony thorax is unremarkable. IMPRESSION: Stable support apparatus. Increased bibasilar atelectasis is  noted with small pleural effusions. Electronically Signed   By: Marijo Conception M.D.   On: 01/18/2021 08:39   DG Chest Port 1 View  Result Date: 01/17/2021 CLINICAL DATA:  Surgery follow-up post coronary artery bypass EXAM: PORTABLE CHEST 1 VIEW COMPARISON:  01/17/2021 FINDINGS: Interval postoperative changes with sternotomy wires and surgical clips and vascular markers in the mediastinum. Interval placement of an endotracheal tube with tip measuring 5.2 cm above the carina. An enteric tube has been placed with tip off the field of view but below the left hemidiaphragm. Catheter is projected over the heart likely represent 2 pericardial drains. Right central venous catheter with tip over the upper SVC region. Borderline heart size. Mild pulmonary vascular congestion is suggested. Atelectasis in the left base. No airspace disease or consolidation. No pleural effusions. No pneumothorax. IMPRESSION: Interval postoperative changes in the mediastinum. Appliances appear in satisfactory position. Electronically Signed   By: Lucienne Capers M.D.   On: 01/17/2021 20:31   DG CHEST PORT 1 VIEW  Result Date: 01/16/2021 CLINICAL DATA:  Preop CABG EXAM: PORTABLE CHEST 1 VIEW COMPARISON:  01/09/2021 FINDINGS: Mild elevation of the right hemidiaphragm. Heart is normal size. Peribronchial thickening. No confluent opacities or effusions. No acute bony abnormality. IMPRESSION: Mild elevation of the right hemidiaphragm. Mild bronchitic changes. Electronically Signed   By: Rolm Baptise M.D.   On: 01/16/2021 16:46   ECHO INTRAOPERATIVE TEE  Result Date: 01/21/2021  *INTRAOPERATIVE TRANSESOPHAGEAL REPORT *  Patient Name:   [name] Date of Exam: 01/17/2021 Medical Rec #:  [id number]      Height:       67.0 in Accession #:    [id number]     Weight:       163.1 lb Date of Birth:  Jul 22, 1940       BSA:          1.85 m Patient Age:    89 years       BP:           184/69 mmHg Patient Gender: F              HR:           63 bpm.  Exam Location:  Anesthesiology Transesophogeal exam was perform intraoperatively during surgical procedure. Patient was closely monitored under general anesthesia during the entirety of examination. Indications:     Coronary Artery Disease Sonographer:     Bernadene Person RDCS Performing Phys: Renold Don MD Diagnosing Phys: Renold Don MD Complications: No known complications during this procedure. POST-OP IMPRESSIONS _ Left Ventricle: The left ventricle is unchanged from pre-bypass. _ Right Ventricle: The right ventricle appears unchanged from pre-bypass. _ Aorta: The aorta appears unchanged from pre-bypass. _ Left Atrium: The left atrium appears unchanged from pre-bypass. _ Left Atrial Appendage: The left atrial appendage appears unchanged from pre-bypass. _ Aortic Valve: The aortic valve appears unchanged from pre-bypass. _ Mitral Valve: The mitral valve appears unchanged from pre-bypass. _ Tricuspid Valve: The tricuspid valve appears unchanged from pre-bypass. _ Pulmonic Valve: The pulmonic valve appears unchanged from pre-bypass. _ Interatrial Septum: The interatrial septum appears unchanged from pre-bypass. _ Interventricular Septum: The interventricular septum appears  unchanged from pre-bypass. _ Pericardium: The pericardium appears unchanged from pre-bypass. PRE-OP FINDINGS  Left Ventricle: The left ventricle has mildly reduced systolic function, with an ejection fraction of 45-50%. The cavity size was normal. There is mild concentric left ventricular hypertrophy. Right Ventricle: The right ventricle has normal systolic function. The cavity was normal. There is no increase in right ventricular wall thickness. Left Atrium: Left atrial size was dilated. No left atrial/left atrial appendage thrombus was detected. Right Atrium: Right atrial size was normal in size. Interatrial Septum: No atrial level shunt detected by color flow Doppler. Pericardium: There is no evidence of pericardial effusion. Mitral Valve:  The mitral valve is normal in structure. Mitral valve regurgitation is trivial by color flow Doppler. There is No evidence of mitral stenosis. Tricuspid Valve: The tricuspid valve was normal in structure. Tricuspid valve regurgitation is mild by color flow Doppler. Aortic Valve: The aortic valve is tricuspid Aortic valve regurgitation was not visualized by color flow Doppler. There is no stenosis of the aortic valve. Pulmonic Valve: The pulmonic valve was not assessed. Pulmonic valve regurgitation was not assessed by color flow Doppler. Aorta: The aortic root, ascending aorta and aortic arch are normal in size and structure. There is evidence of plaque in the descending aorta; Grade III, measuring 3-87m in size.  TRenold DonMD Electronically signed by TRenold DonMD Signature Date/Time: 01/21/2021/2:14:31 PM    Final    VAS [place]orea[place]OPPLER PRE CABG  Result Date: 01/16/2021 PREOPERATIVE VASCULAR EVALUATION Patient Name:  [name] Date of Exam:   01/16/2021 Medical Rec #: [id number]      Accession #:    [id number] of Birth: [birth year]       Patient Gender: F Patient Age:   89years [facility] [place]PLER PRE CABG Referring Phys: HARRELL LIGHTFOOT --------------------------------------------------------------------------------  Indications:            Pre-CABG. Risk Factors:           Hypertension, hyperlipidemia, Diabetes, current smoker,                         coronary artery disease, prior CVA, PAD. Other Factors:          AAA repair 2007. Hx of TIA, History of renal artery                         stenosis with bilateral renal artery stents placed                         06/25/19. Vascular Interventions: 07/06/2014 -Left femoral to below pop BPG. Comparison Study:       Prior carotid duplex indicating 1-39% bilateral ICA                         stenosis, done 06/19/19. Prior ABI done 06/19/19. Right                         ABI: 0.54 Left ABI:0.73 Right great  toe:0.47 Left great                         toe:0.45 Performing Technologist: KSharion DoveRVS  Examination Guidelines: A complete evaluation includes B-mode imaging, spectral Doppler, color Doppler, and power  Doppler as needed of all accessible portions of each vessel. Bilateral testing is considered an integral part of a complete examination. Limited examinations for reoccurring indications may be performed as noted.  Right Carotid Findings: +----------+--------+--------+--------+----------------------+--------+           PSV cm/sEDV cm/sStenosisDescribe              Comments +----------+--------+--------+--------+----------------------+--------+ CCA Prox  199     34              homogeneous                    +----------+--------+--------+--------+----------------------+--------+ CCA Mid   211     34              heterogenous                   +----------+--------+--------+--------+----------------------+--------+ CCA Distal147     29              heterogenous                   +----------+--------+--------+--------+----------------------+--------+ ICA Prox  188     24      1-39%   irregular and calcific         +----------+--------+--------+--------+----------------------+--------+ ICA Distal55      17                                             +----------+--------+--------+--------+----------------------+--------+ ECA       153     27                                             +----------+--------+--------+--------+----------------------+--------+ +----------+--------+-------+--------+------------+           PSV cm/sEDV cmsDescribeArm Pressure +----------+--------+-------+--------+------------+ Subclavian107                                 +----------+--------+-------+--------+------------+ +---------+--------+--+--------+--+ VertebralPSV cm/s59EDV cm/s16 +---------+--------+--+--------+--+ Left Carotid Findings:  +----------+--------+--------+--------+----------------------------+--------+           PSV cm/sEDV cm/sStenosisDescribe                    Comments +----------+--------+--------+--------+----------------------------+--------+ CCA Prox  88      17              heterogenous and homogeneous         +----------+--------+--------+--------+----------------------------+--------+ CCA Mid   189     25              homogeneous                          +----------+--------+--------+--------+----------------------------+--------+ CCA Distal134     25              homogeneous                          +----------+--------+--------+--------+----------------------------+--------+ ICA Prox  71      18      1-39%   heterogenous                         +----------+--------+--------+--------+----------------------------+--------+  ICA Distal75      24                                                   +----------+--------+--------+--------+----------------------------+--------+ ECA       119     4                                                    +----------+--------+--------+--------+----------------------------+--------+ +----------+--------+--------+--------+------------+ SubclavianPSV cm/sEDV cm/sDescribeArm Pressure +----------+--------+--------+--------+------------+           136                                  +----------+--------+--------+--------+------------+ +---------+--------+--+--------+--+ VertebralPSV cm/s68EDV cm/s17 +---------+--------+--+--------+--+  ABI Findings: +---------+------------------+-----+-------------------+--------+ Right    Rt Pressure (mmHg)IndexWaveform           Comment  +---------+------------------+-----+-------------------+--------+ Brachial 148                    biphasic                    +---------+------------------+-----+-------------------+--------+ PTA      72                0.46 dampened monophasic          +---------+------------------+-----+-------------------+--------+ DP       70                0.45 dampened monophasic         +---------+------------------+-----+-------------------+--------+ Great Toe16                0.10                             +---------+------------------+-----+-------------------+--------+ +---------+------------------+-----+----------+-------+ Left     Lt Pressure (mmHg)IndexWaveform  Comment +---------+------------------+-----+----------+-------+ Brachial 155                    biphasic          +---------+------------------+-----+----------+-------+ PTA      90                0.58 monophasic        +---------+------------------+-----+----------+-------+ DP       55                0.35 monophasic        +---------+------------------+-----+----------+-------+ Great Toe50                0.32                   +---------+------------------+-----+----------+-------+ +-------+---------------+----------------+ ABI/TBIToday's ABI/TBIPrevious ABI/TBI +-------+---------------+----------------+ Right  0.46           0.54             +-------+---------------+----------------+ Left   0.58           0.73             +-------+---------------+----------------+ Right ABIs appear decreased compared to prior study on 06/19/19. Left ABIs appear decreased compared to prior study on 06/19/19. Bilateral TBIs appear decreased since study done 06/19/19 Right Doppler Findings: +--------+--------+-----+--------+--------+  Site    PressureIndexDoppler Comments +--------+--------+-----+--------+--------+ [id number]          biphasic         +--------+--------+-----+--------+--------+ Radial               biphasic         +--------+--------+-----+--------+--------+ Ulnar                biphasic         +--------+--------+-----+--------+--------+  Left Doppler Findings: +--------+--------+-----+--------+--------+ Site     PressureIndexDoppler Comments +--------+--------+-----+--------+--------+ [id number]          biphasic         +--------+--------+-----+--------+--------+ Radial               biphasic         +--------+--------+-----+--------+--------+ Ulnar                biphasic         +--------+--------+-----+--------+--------+  Summary: Right Carotid: Velocities in the right ICA are consistent with a 1-39% stenosis. Left Carotid: Velocities in the left ICA are consistent with a 1-39% stenosis. Right ABI: Resting right ankle-brachial index indicates severe right lower extremity arterial disease. The right toe-brachial index is abnormal. Left ABI: Resting left ankle-brachial index indicates moderate left lower extremity arterial disease. The left toe-brachial index is abnormal. Right Upper Extremity: Doppler waveform obliterate with right radial compression. Doppler waveforms remain within normal limits with right ulnar compression. Left Upper Extremity: Doppler waveforms decrease 50% with left radial compression. Doppler waveforms remain within normal limits with left ulnar compression.  Electronically signed by Deitra Mayo MD on 01/16/2021 at 7:21:29 PM.    Final     Assessment/Plan  1. Weight loss -Had 6.2%, 10.4 lbs in 7 days -   Will start Glucerna 237 mL twice a day for supplementation  2. Primary hypertension -  BPs elevated, will increase Norvasc from 5 mg daily to 10 mg daily and continue losartan 25 mg daily     Family/ staff Communication:   Discussed plan of care with resident and charge nurse.  Labs/tests ordered: None  Goals of care:   Short-term care   Durenda Age, DNP, MSN, FNP-BC [facility] [phone] (Monday-Friday 8:00 a.m. - 5:00 p.m.) [phone] (after hours)

## 2021-02-17 LAB — BASIC METABOLIC PANEL
BUN: 33 — AB (ref 4–21)
CO2: 23 — AB (ref 13–22)
Chloride: 103 (ref 99–108)
Creatinine: 3.1 — AB (ref 0.5–1.1)
Glucose: 165
Potassium: 5.2 (ref 3.4–5.3)
Sodium: 135 — AB (ref 137–147)

## 2021-02-17 LAB — CBC AND DIFFERENTIAL
HCT: 30 — AB (ref 36–46)
Hemoglobin: 10.1 — AB (ref 12.0–16.0)
Platelets: 169 (ref 150–399)
WBC: 8.2

## 2021-02-17 LAB — CBC: RBC: 3.51 — AB (ref 3.87–5.11)

## 2021-02-17 LAB — COMPREHENSIVE METABOLIC PANEL: Calcium: 8.7 (ref 8.7–10.7)

## 2021-02-21 ENCOUNTER — Non-Acute Institutional Stay (SKILLED_NURSING_FACILITY): Payer: Medicare HMO | Admitting: Adult Health

## 2021-02-21 ENCOUNTER — Encounter: Payer: Self-pay | Admitting: Adult Health

## 2021-02-21 DIAGNOSIS — N179 Acute kidney failure, unspecified: Secondary | ICD-10-CM | POA: Diagnosis not present

## 2021-02-21 DIAGNOSIS — F17200 Nicotine dependence, unspecified, uncomplicated: Secondary | ICD-10-CM | POA: Diagnosis not present

## 2021-02-21 DIAGNOSIS — E1159 Type 2 diabetes mellitus with other circulatory complications: Secondary | ICD-10-CM | POA: Diagnosis not present

## 2021-02-21 NOTE — Progress Notes (Signed)
Location:  [place] Room Number: 831-D Place of Service:  SNF (31) Provider:  Durenda Age, DNP, FNP-BC  Patient Care Team: Laura Jordan, MD as PCP - General (Family Medicine) Laura Forward, MD as Attending Physician (Internal Medicine) Laura Rossetti, MD as Consulting Physician (Orthopedic Surgery) Lbcardiology, Rounding, MD (Cardiology)  Extended Emergency Contact Information Primary Emergency Contact: [place] Work Phone: [phone] Mobile Phone: [phone] Relation: Daughter  Code Status: DNR   Goals of care: Advanced Directive information Advanced Directives 02/21/2021  Does Patient Have a Medical Advance Directive? Yes  Type of Paramedic of [place];Out of facility DNR (pink MOST or yellow form)  Does patient want to make changes to medical advance directive? No - Patient declined  Copy of [place] in Chart? -  Would patient like information on creating a medical advance directive? -     Chief Complaint  Patient presents with   Acute Visit    Acute Kidney Injury (AKI)    HPI:  Pt is a 80 y.o. [name] seen today for AKI. She is currently having a short-term rehabilitation at [facility] post hospital admission 01/09/2021 to 01/27/21 for chest pain.  She was seen by cardiologist, Dr. Terrence Dupont, and cardiac catheterization was performed which revealed diffuse vascular lesions with blockage of 70 to 90%.  On 8/22, she had CABG x3, LIMA to LAD, reverse saphenous vein graft to the distal right coronary artery, reverse saphenous vein graft to OM1.  Postop, she was transfused 2 units PRBC due to acute blood loss.  She had vascular congestion.  Imaging revealed atelectasis and small effusions.  Lasix was started.  Hospitalization was also complicated by AKI with creatinine up to 2.03.  She was given IV fluids and creatinine improved to  1.35.  She had numbness on her hands.  Neurology was consulted.  Symptoms resolved spontaneously.  She had an episode of atrial fibrillation and was given IV amiodarone.  She had bradycardia so beta-blocker was not initiated.  She was noted to have creatinine 3.12, up from 1.61 (01/31/21). She is Currently taking Amaryl 2 mg every morning for diabetes mellitus.  CBGs ranging from 89-299, with outlier 82. Last A1C 9.1, 01/17/21.      Past Medical History:  Diagnosis Date   AAA (abdominal aortic aneurysm) ([place])    Aneurysm (HCC)    Arthritis    osteoarthritis   Constipation    Diabetes mellitus    Type 2, on Glimiperide   GERD (gastroesophageal reflux disease)    Glaucoma    Hyperlipidemia    Hypertension    Kidney stones    Myocardial infarction [facility])    Peripheral vascular disease ([place])    Pneumonia    Stroke ([place]) 01/2015   with left side weakness    TIA (transient ischemic attack)    Urinary incontinence    Past Surgical History:  Procedure Laterality Date   ABDOMINAL AORTAGRAM N/A 03/13/2014   Procedure: ABDOMINAL Maxcine Ham;  Surgeon: Elam Dutch, MD;  Location: [facility] CATH LAB;  Service: Cardiovascular;  Laterality: N/A;   ABDOMINAL AORTIC ANEURYSM REPAIR  ?2012   ABDOMINAL HYSTERECTOMY     partial   BACK SURGERY     CARDIAC CATHETERIZATION     CORONARY ANGIOPLASTY  03/30/2015   CORONARY ARTERY BYPASS GRAFT N/A 01/17/2021   Procedure: CORONARY ARTERY BYPASS GRAFTING (CABG) TIMES 3 , ON PUMP, USING LEFT INTERNAL MAMMARY ARTERY AND ENDOSCOPICALLY  HARVESTED RIGHT GREATER SAPHENOUS VEIN;  Surgeon: Lajuana Matte, MD;  Location: [place];  Service: Open Heart Surgery;  Laterality: N/A;   ENDOVEIN HARVEST OF GREATER SAPHENOUS VEIN Right 01/17/2021   Procedure: ENDOVEIN HARVEST OF GREATER SAPHENOUS VEIN;  Surgeon: Lajuana Matte, MD;  Location: [place];  Service: Open Heart Surgery;  Laterality: Right;   EYE SURGERY Right    laser surgery for blood behind eye, loss of  sight   FEMORAL-POPLITEAL BYPASS GRAFT Left 07/06/2014   Procedure: BYPASS GRAFT FEMORAL-POPLITEAL ARTERY;  Surgeon: Elam Dutch, MD;  [facility];  Service: Vascular;  Laterality: Left;   HERNIA REPAIR     JOINT REPLACEMENT     LEFT HEART CATH AND CORONARY ANGIOGRAPHY N/A 01/11/2021   Procedure: LEFT HEART CATH AND CORONARY ANGIOGRAPHY;  Surgeon: Laura Forward, MD;  Location: [place] CV LAB;  Service: Cardiovascular;  Laterality: N/A;   LEFT HEART CATHETERIZATION WITH CORONARY ANGIOGRAM N/A 04/12/2014   Procedure: LEFT HEART CATHETERIZATION WITH CORONARY ANGIOGRAM;  Surgeon: Clent Demark, MD;  Location: [place] CATH LAB;  Service: Cardiovascular;  Laterality: N/A;   PERCUTANEOUS CORONARY STENT INTERVENTION (PCI-S)  04/12/2014   Procedure: PERCUTANEOUS CORONARY STENT INTERVENTION (PCI-S);  Surgeon: Clent Demark, MD;  Location: [facility] CATH LAB;  Service: Cardiovascular;;  prox and mid RCA   PERIPHERAL VASCULAR INTERVENTION  06/25/2019   Procedure: PERIPHERAL VASCULAR INTERVENTION;  Surgeon: Elam Dutch, MD;  Location: [place] CV LAB;  Service: Cardiovascular;;  Lt Renal and Rt Renal   RENAL ANGIOGRAPHY Bilateral 06/25/2019   Procedure: RENAL ANGIOGRAPHY;  Surgeon: Elam Dutch, MD;  Location: [place] CV LAB;  Service: Cardiovascular;  Laterality: Bilateral;   TEE WITHOUT CARDIOVERSION N/A 01/17/2021   Procedure: TRANSESOPHAGEAL ECHOCARDIOGRAM (TEE);  Surgeon: Lajuana Matte, MD;  Location: [place];  Service: Open Heart Surgery;  Laterality: N/A;   TOTAL HIP ARTHROPLASTY Left 05/18/2015   TOTAL HIP ARTHROPLASTY Left 05/18/2015   Procedure: LEFT TOTAL HIP ARTHROPLASTY ANTERIOR APPROACH;  Surgeon: Laura Rossetti, MD;  Location: [place];  Service: Orthopedics;  Laterality: Left;   TOTAL KNEE ARTHROPLASTY Left ~ 2003   TUBAL LIGATION     uterine tumor     VENTRAL HERNIA REPAIR      Allergies  Allergen Reactions   Keflex [Cephalexin] Shortness Of Breath  and Other (See Comments)    Chest pain, headache   Lipitor [Atorvastatin] Shortness Of Breath and Other (See Comments)    Severe pain/cramping in legs- "I cannot walk, talk, or breathe" (pt is currently taking 20 mg daily 10/11/19)   Codeine Other (See Comments)    Disrupted patient's equilibrium- "Made my world flip upside down"   Hydrochlorothiazide Other (See Comments)    Other reaction(s): lightheaded, nausea   Hydrocodone-Acetaminophen Other (See Comments)    Other reaction(s): "get crazy"   Metformin Hcl Other (See Comments)    Other reaction(s): upset stomach   Tramadol Hcl Other (See Comments)    Other reaction(s): "get crazy"   Azor [Amlodipine-Olmesartan] Swelling, Palpitations and Rash    Amlodipine alone resumed 12/2013, tolerating   Lisinopril Cough   Penicillins Dermatitis and Other (See Comments)    Did it involve swelling of the face/tongue/throat, SOB, or low BP? No, just worsened eczema Did it involve sudden or severe rash/hives, skin peeling, or any reaction on the inside of your mouth or nose? No Did you need to seek medical attention at a hospital or doctor's office? No When did  it last happen? "More than 10 years ago" If all above answers are "NO", may proceed with cephalosporin use.     Outpatient Encounter Medications as of 02/21/2021  Medication Sig   acetaminophen (TYLENOL) 650 MG CR tablet Take 650 mg by mouth at bedtime as needed for pain.    albuterol (PROVENTIL HFA;VENTOLIN HFA) 108 (90 Base) MCG/ACT inhaler Inhale 2 puffs into the lungs every 4 (four) hours as needed for wheezing or shortness of breath (or coughing).   amLODipine (NORVASC) 5 MG tablet Take 5 mg by mouth daily.   aspirin EC 81 MG EC tablet Take 1 tablet (81 mg total) by mouth daily. Swallow whole.   bisacodyl (DULCOLAX) 10 MG suppository Place 10 mg rectally as needed for moderate constipation.   busPIRone (BUSPAR) 5 MG tablet Take 1 tablet (5 mg total) by mouth 2 (two) times daily.    clopidogrel (PLAVIX) 75 MG tablet Take 1 tablet (75 mg total) by mouth daily.   feeding supplement, GLUCERNA SHAKE, (GLUCERNA SHAKE) LIQD Take 237 mLs by mouth 2 (two) times daily between meals.   isosorbide mononitrate (IMDUR) 30 MG 24 hr tablet Take 1 tablet (30 mg total) by mouth daily.   losartan (COZAAR) Laura MG tablet Take 1 tablet (Laura mg total) by mouth daily.   Magnesium Hydroxide (MILK OF MAGNESIA PO) Take 30 mLs by mouth as needed.   Menthol, Topical Analgesic, (BIOFREEZE EX) Apply topically. Apply topically to affected areas three times daily as needed for pain.   NON FORMULARY Diet: Regular diet with thin liquids   pantoprazole (PROTONIX) 40 MG tablet Take 1 tablet (40 mg total) by mouth daily.   rosuvastatin (CRESTOR) 10 MG tablet Take 1 tablet (10 mg total) by mouth at bedtime.   Sodium Phosphates (RA SALINE ENEMA RE) Place 1 Dose rectally as needed.   varenicline (CHANTIX) 0.5 MG tablet Take 1 tablet (0.5 mg total) by mouth 2 (two) times daily.   vitamin B-12 (CYANOCOBALAMIN) 250 MCG tablet Take 1 tablet (250 mcg total) by mouth daily.   furosemide (LASIX) 40 MG tablet Take 1 tablet (40 mg total) by mouth daily for 7 days.   glimepiride (AMARYL) 2 MG tablet Take 1 tablet (2 mg total) by mouth every morning.   potassium chloride SA (KLOR-CON) 20 MEQ tablet Take 1 tablet (20 mEq total) by mouth daily for 7 days.   No facility-administered encounter medications on file as of 02/21/2021.    Review of Systems  GENERAL: No fever or chills MOUTH and THROAT: Denies oral discomfort, gingival pain or bleeding RESPIRATORY: no cough, SOB, DOE, wheezing, hemoptysis CARDIAC: No chest pain, edema or palpitations GI: No abdominal pain, diarrhea, constipation, heart burn, nausea or vomiting GU: Denies dysuria, frequency, hematuria, incontinence, or discharge MUSCULOSKELETAL: Denies joint pain, muscle pain, back pains NEUROLOGICAL: Denies dizziness, syncope, numbness, or headache PSYCHIATRIC:  Denies feelings of depression or anxiety. No report of hallucinations, insomnia, paranoia, or agitation    Immunization History  Administered Date(s) Administered   PFIZER(Purple Top)SARS-COV-2 Vaccination 02/Laura/2021, 08/19/2019   Pneumococcal Conjugate-13 02/26/2015   Pneumococcal Polysaccharide-23 04/19/2010, 05/01/2016   Td 07/22/2010   Pertinent  Health Maintenance Due  Topic Date Due   FOOT EXAM  Never done   OPHTHALMOLOGY EXAM  Never done   DEXA SCAN  Never done   INFLUENZA VACCINE  Never done   HEMOGLOBIN A1C  07/20/2021   Fall Risk  04/16/2015  Falls in the past year? Yes  Number falls in past yr:  1  Injury with Fall? No  Risk for fall due to : Impaired balance/gait     Vitals:   02/21/21 1413  BP: 118/69  Pulse: 88  Resp: 17  Temp: 97.7 F (36.5 C)  Weight: 155 lb 9.6 oz (70.6 kg)  Height: 5\' 7"  (1.702 m)   Body mass index is 24.37 kg/m.  Physical Exam  GENERAL APPEARANCE: Well nourished. In no acute distress. Normal body habitus SKIN:  Skin is warm and dry.  MOUTH and THROAT: Lips are without lesions. Oral mucosa is moist and without lesions.  RESPIRATORY: Breathing is even & unlabored, BS CTAB CARDIAC: RRR, no murmur,no extra heart sounds, no edema GI: Abdomen soft, normal BS, no masses, no tenderness EXTREMITIES:  Able to move X 4 extremities. NEUROLOGICAL: There is no tremor. Speech is clear. Alert and oriented X 3.  PSYCHIATRIC: Affect and behavior are appropriate  Labs reviewed: Recent Labs    01/18/21 1540 01/18/21 1731 08/Laura/22 0428 01/21/21 0646 01/22/21 0019 01/24/21 0151 01/27/21 0141 01/31/21 1244 02/17/21 0000  NA 139   < > 140 139   < > 139 137 137 135*  K 4.1   < > 4.6 4.2   < > 4.1 4.5 4.8 5.2  CL 109   < > 104 107   < > 106 103 103 103  CO2 18*   < > 24 23   < > 26 Laura 26  23*  GLUCOSE 222*   < > 168* 154*   < > 140* 123* 107*  --   BUN 13   < > 26* 30*   < > 18 14 16  33*  CREATININE 1.53*   < > 2.03* 1.84*   < > 1.38*  1.35* 1.61* 3.1*  CALCIUM 8.7*   < > 9.1 8.4*   < > 8.4* 8.3* 9.2 8.7  MG 3.0*  --  2.9* 2.9*  --   --   --   --   --    < > = values in this interval not displayed.   No results for input(s): AST, ALT, ALKPHOS, BILITOT, PROT, ALBUMIN in the last 8760 hours. Recent Labs    04/Laura/22 0827 01/09/21 2230 01/19/21 0322 08/Laura/22 0428 01/31/21 1244 02/17/21 0000  WBC 6.1   < > 12.8* 12.6* 10.3 8.2  NEUTROABS 3.4  --   --   --  7.3  --   HGB 13.2   < > 10.8* 10.6* 10.7* 10.1*  HCT 40.6   < > 31.8* 32.9* 33.6* 30*  MCV 91.6   < > 88.1 90.6 91.3  --   PLT 165   < > 135* 157 187 169   < > = values in this interval not displayed.   Lab Results  Component Value Date   TSH 1.248 08/Laura/2022   Lab Results  Component Value Date   HGBA1C 9.1 (H) 01/17/2021   Lab Results  Component Value Date   CHOL 247 (H) 01/10/2021   HDL 39 (L) 01/10/2021   LDLCALC 180 (H) 01/10/2021   TRIG 139 01/10/2021   CHOLHDL 6.3 01/10/2021    Significant Diagnostic Results in last 30 days:  DG Chest 2 View  Result Date: 01/27/2021 CLINICAL DATA:  Status post coronary bypass graft. EXAM: CHEST - 2 VIEW COMPARISON:  January 25, 2021. FINDINGS: Stable cardiomegaly. Status post coronary bypass graft. No pneumothorax is noted. Mild bilateral pulmonary edema is noted with bilateral pleural effusions and associated atelectasis. Bony thorax is unremarkable. IMPRESSION:  Stable cardiomegaly with bilateral pulmonary edema and pleural effusions and associated atelectasis. Electronically Signed   By: Marijo Conception M.D.   On: 01/27/2021 13:10   DG Chest 2 View  Result Date: 01/25/2021 CLINICAL DATA:  Post cardiac surgery 01/17/2021 EXAM: CHEST - 2 VIEW COMPARISON:  01/19/2021 FINDINGS: Enlargement of cardiac silhouette post CABG. Mediastinal contours and pulmonary vascularity normal. Persistent perihilar infiltrates likely pulmonary edema. Bibasilar pleural effusions and atelectasis greater on RIGHT. No pneumothorax.  IMPRESSION: Enlargement of cardiac silhouette with probable mild persistent pulmonary edema post CABG. Bibasilar pleural effusions and atelectasis, slightly greater on RIGHT, not significantly changed. Electronically Signed   By: Lavonia Dana M.D.   On: 01/25/2021 10:40   DG Chest Portable 1 View  Result Date: 01/31/2021 CLINICAL DATA:  Chest pain for 1 week post CABG EXAM: PORTABLE CHEST 1 VIEW COMPARISON:  Portable exam 1335 hours compared to 01/27/2021 FINDINGS: Upper normal heart size post CABG. Mediastinal contours and pulmonary vascularity normal. Bibasilar atelectasis and small pleural effusions, slightly decreased from previous exam. No focal consolidation or pneumothorax. Bones demineralized. IMPRESSION: Bibasilar pleural effusions and atelectasis, slightly improved from 01/27/2021 Electronically Signed   By: Lavonia Dana M.D.   On: 01/31/2021 14:01    Assessment/Plan  1. AKI (acute kidney injury) ([place]) BMP Latest Ref Rng & Units 02/17/2021 01/31/2021 01/27/2021  Glucose 70 - 99 mg/dL - 107(H) 123(H)  BUN 4 - 21 33(A) 16 14  Creatinine 0.5 - 1.1 3.1(A) 1.61(H) 1.35(H)  Sodium 137 - 147 135(A) 137 137  Potassium 3.4 - 5.3 5.2 4.8 4.5  Chloride 99 - 108 103 103 103  CO2 13 - 22 23(A) 26 Laura  Calcium 8.7 - 10.7 8.7 9.2 8.3(L)   -02/17/2021, creatinine 3.12, BUN 23, GFR 14 -  will start 0.9 NS IVF at 70 mL/via peripheral IV x1 L   2. Type 2 diabetes mellitus with other circulatory complication, without long-term current use of insulin (HCC) Lab Results  Component Value Date   HGBA1C 9.1 (H) 01/17/2021   -    Discontinue Amaryl due to AKI -Will start NovoLog 5 units SQ BID for CBG >= 200 and CBG twice daily  3. Tobacco use disorder -  stated that she has not smoked since hospitalization and does not want to smoke -Discontinue Chantix     Family/ staff Communication:   Discussed plan of care with resident and charge nurse.  Labs/tests ordered:   BMP on 02/22/2021  Goals of care:  Short-term care   Durenda Age, DNP, MSN, FNP-BC [facility] [phone] (Monday-Friday 8:00 a.m. - 5:00 p.m.) [phone] (after hours)

## 2021-02-26 DIAGNOSIS — I214 Non-ST elevation (NSTEMI) myocardial infarction: Secondary | ICD-10-CM | POA: Diagnosis not present

## 2021-02-26 DIAGNOSIS — M6281 Muscle weakness (generalized): Secondary | ICD-10-CM | POA: Diagnosis not present

## 2021-02-26 DIAGNOSIS — E1159 Type 2 diabetes mellitus with other circulatory complications: Secondary | ICD-10-CM | POA: Diagnosis not present

## 2021-02-26 DIAGNOSIS — R0789 Other chest pain: Secondary | ICD-10-CM | POA: Diagnosis not present

## 2021-02-26 DIAGNOSIS — N1832 Chronic kidney disease, stage 3b: Secondary | ICD-10-CM | POA: Diagnosis not present

## 2021-02-26 DIAGNOSIS — I25119 Atherosclerotic heart disease of native coronary artery with unspecified angina pectoris: Secondary | ICD-10-CM | POA: Diagnosis not present

## 2021-02-26 DIAGNOSIS — D62 Acute posthemorrhagic anemia: Secondary | ICD-10-CM | POA: Diagnosis not present

## 2021-02-26 DIAGNOSIS — E875 Hyperkalemia: Secondary | ICD-10-CM | POA: Diagnosis not present

## 2021-02-26 DIAGNOSIS — Z8679 Personal history of other diseases of the circulatory system: Secondary | ICD-10-CM | POA: Diagnosis not present

## 2021-02-26 DIAGNOSIS — I63311 Cerebral infarction due to thrombosis of right middle cerebral artery: Secondary | ICD-10-CM | POA: Diagnosis not present

## 2021-02-26 DIAGNOSIS — N179 Acute kidney failure, unspecified: Secondary | ICD-10-CM | POA: Diagnosis not present

## 2021-02-26 DIAGNOSIS — I25110 Atherosclerotic heart disease of native coronary artery with unstable angina pectoris: Secondary | ICD-10-CM | POA: Diagnosis not present

## 2021-02-26 DIAGNOSIS — J9600 Acute respiratory failure, unspecified whether with hypoxia or hypercapnia: Secondary | ICD-10-CM | POA: Diagnosis not present

## 2021-02-26 DIAGNOSIS — J90 Pleural effusion, not elsewhere classified: Secondary | ICD-10-CM | POA: Diagnosis not present

## 2021-02-26 DIAGNOSIS — R2681 Unsteadiness on feet: Secondary | ICD-10-CM | POA: Diagnosis not present

## 2021-02-26 DIAGNOSIS — I10 Essential (primary) hypertension: Secondary | ICD-10-CM | POA: Diagnosis not present

## 2021-02-26 DIAGNOSIS — I252 Old myocardial infarction: Secondary | ICD-10-CM | POA: Diagnosis not present

## 2021-02-26 DIAGNOSIS — K219 Gastro-esophageal reflux disease without esophagitis: Secondary | ICD-10-CM | POA: Diagnosis not present

## 2021-02-26 DIAGNOSIS — E1151 Type 2 diabetes mellitus with diabetic peripheral angiopathy without gangrene: Secondary | ICD-10-CM | POA: Diagnosis not present

## 2021-02-26 DIAGNOSIS — F321 Major depressive disorder, single episode, moderate: Secondary | ICD-10-CM | POA: Diagnosis not present

## 2021-02-26 DIAGNOSIS — N183 Chronic kidney disease, stage 3 unspecified: Secondary | ICD-10-CM | POA: Diagnosis not present

## 2021-02-26 DIAGNOSIS — E1351 Other specified diabetes mellitus with diabetic peripheral angiopathy without gangrene: Secondary | ICD-10-CM | POA: Diagnosis not present

## 2021-02-26 DIAGNOSIS — Z951 Presence of aortocoronary bypass graft: Secondary | ICD-10-CM | POA: Diagnosis not present

## 2021-03-01 ENCOUNTER — Encounter: Payer: Self-pay | Admitting: Adult Health

## 2021-03-01 ENCOUNTER — Non-Acute Institutional Stay (SKILLED_NURSING_FACILITY): Payer: Medicare Other | Admitting: Adult Health

## 2021-03-01 DIAGNOSIS — I10 Essential (primary) hypertension: Secondary | ICD-10-CM

## 2021-03-01 DIAGNOSIS — N179 Acute kidney failure, unspecified: Secondary | ICD-10-CM | POA: Diagnosis not present

## 2021-03-01 DIAGNOSIS — I252 Old myocardial infarction: Secondary | ICD-10-CM

## 2021-03-01 DIAGNOSIS — I25119 Atherosclerotic heart disease of native coronary artery with unspecified angina pectoris: Secondary | ICD-10-CM | POA: Diagnosis not present

## 2021-03-01 DIAGNOSIS — N1832 Chronic kidney disease, stage 3b: Secondary | ICD-10-CM

## 2021-03-01 DIAGNOSIS — E1159 Type 2 diabetes mellitus with other circulatory complications: Secondary | ICD-10-CM | POA: Diagnosis not present

## 2021-03-01 DIAGNOSIS — E875 Hyperkalemia: Secondary | ICD-10-CM | POA: Diagnosis not present

## 2021-03-01 DIAGNOSIS — Z8679 Personal history of other diseases of the circulatory system: Secondary | ICD-10-CM | POA: Diagnosis not present

## 2021-03-01 NOTE — Progress Notes (Signed)
Location:  [place] Room Number: 307 [name]:  SNF (31) Provider:  Durenda Age, DNP, FNP-BC  Patient Care Team: [name], MD as PCP - General (Family Medicine) [name], MD as Attending Physician (Internal Medicine) Mcarthur Rossetti, MD as Consulting Physician (Orthopedic Surgery) Lbcardiology, Rounding, MD (Cardiology)  Extended Emergency Contact Information Primary Emergency Contact: [place] Work Phone: [phone] Mobile Phone: [phone] Relation: Daughter  Code Status:  DNR  Goals of care: Advanced Directive information Advanced Directives 03/01/2021  Does Patient Have a Medical Advance Directive? Yes  Type of Advance Directive Living will;Healthcare Power of Attorney  Does patient want to make changes to medical advance directive? No - Patient declined  Copy of [place] in Chart? Yes - validated most recent copy scanned in chart (See row information)  Would patient like information on creating a medical advance directive? -     Chief Complaint  Patient presents with   Acute Visit    Elevated K and creatinine    HPI:  Pt is a 80 y.o. female seen today for K 5.4 and creatinine 3.38, up from 3.15 and GFR  <15. She in not taking any diuretics. She is currently taking Losartan 25 mg daily for hypertension. BP today 134/78.   She was admitted to [facility] on 01/27/21 post hospitalization 01/09/2021 to 01/27/21 for chest pain.  She was seen by cardiologist, Dr. Terrence Dupont, and cardiac catheterization was performed which revealed diffuse vascular lesions with blockage of 70 to 90%.  On 8/22, she had CABG x3, LIMA to LAD, reverse saphenous vein graft to the distal right coronary artery, reverse saphenous vein graft to OM1.  Postop, she was transfused 2 units PRBC due to acute blood loss.  She had congestion and imaging revealed atelectasis  and small effusions.  Lasix was restarted.  Hospitalization was complicated by AKI with creatinine up to 2.03.  She was given IV fluids and creatinine improved to 1.35.  He had numbness on her hands.  Neurology was consulted.  Symptoms resolved spontaneously.  She had an episode of atrial fibrillation and was given IV amiodarone.  She had bradycardia so beta-blocker was not initiated.  Her creatinine was noted to elevate despite removing nephrotoxic medications. Of note, she had renal artery stenosis for which she had abdominal aortogram with bilateral renal artery stent on 06/25/2019 by Dr. Oneida Alar.  The daughter is currently in the process of wanting patient to be transferred to another SNF.  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    TIA (transient ischemic attack)    Urinary incontinence    Past Surgical History:  Procedure Laterality Date   ABDOMINAL AORTAGRAM N/A 03/13/2014   Procedure: ABDOMINAL Maxcine Ham;  Surgeon: Elam Dutch, MD;  Location: [facility] CATH LAB;  Service: Cardiovascular;  Laterality: N/A;   ABDOMINAL AORTIC ANEURYSM REPAIR  ?2012   ABDOMINAL HYSTERECTOMY     partial   BACK SURGERY  CARDIAC CATHETERIZATION     CORONARY ANGIOPLASTY  03/30/2015   CORONARY ARTERY BYPASS GRAFT N/A 01/17/2021   Procedure: CORONARY ARTERY BYPASS GRAFTING (CABG) TIMES 3 , ON PUMP, USING LEFT INTERNAL MAMMARY ARTERY AND ENDOSCOPICALLY HARVESTED RIGHT GREATER SAPHENOUS VEIN;  Surgeon: Lajuana Matte, MD;  Location: [place];  Service: Open Heart Surgery;  Laterality: N/A;   ENDOVEIN HARVEST OF GREATER  SAPHENOUS VEIN Right 01/17/2021   Procedure: ENDOVEIN HARVEST OF GREATER SAPHENOUS VEIN;  Surgeon: Lajuana Matte, MD;  Location: [place];  Service: Open Heart Surgery;  Laterality: Right;   EYE SURGERY Right    laser surgery for blood behind eye, loss of sight   FEMORAL-POPLITEAL BYPASS GRAFT Left 07/06/2014   Procedure: BYPASS GRAFT FEMORAL-POPLITEAL ARTERY;  Surgeon: Elam Dutch, MD;  Location: [facility] OR;  Service: Vascular;  Laterality: Left;   HERNIA REPAIR     JOINT REPLACEMENT     LEFT HEART CATH AND CORONARY ANGIOGRAPHY N/A 01/11/2021   Procedure: LEFT HEART CATH AND CORONARY ANGIOGRAPHY;  Surgeon: [name], MD;  Location: [place] CV LAB;  Service: Cardiovascular;  Laterality: N/A;   LEFT HEART CATHETERIZATION WITH CORONARY ANGIOGRAM N/A 04/12/2014   Procedure: LEFT HEART CATHETERIZATION WITH CORONARY ANGIOGRAM;  Surgeon: Clent Demark, MD;  Location: [place] CATH LAB;  Service: Cardiovascular;  Laterality: N/A;   PERCUTANEOUS CORONARY STENT INTERVENTION (PCI-S)  04/12/2014   Procedure: PERCUTANEOUS CORONARY STENT INTERVENTION (PCI-S);  Surgeon: Clent Demark, MD;  Location: [facility] CATH LAB;  Service: Cardiovascular;;  prox and mid RCA   PERIPHERAL VASCULAR INTERVENTION  06/25/2019   Procedure: PERIPHERAL VASCULAR INTERVENTION;  Surgeon: Elam Dutch, MD;  Location: [place] CV LAB;  Service: Cardiovascular;;  Lt Renal and Rt Renal   RENAL ANGIOGRAPHY Bilateral 06/25/2019   Procedure: RENAL ANGIOGRAPHY;  Surgeon: Elam Dutch, MD;  Location: [place] CV LAB;  Service: Cardiovascular;  Laterality: Bilateral;   TEE WITHOUT CARDIOVERSION N/A 01/17/2021   Procedure: TRANSESOPHAGEAL ECHOCARDIOGRAM (TEE);  Surgeon: Lajuana Matte, MD;  Location: [place];  Service: Open Heart Surgery;  Laterality: N/A;   TOTAL HIP ARTHROPLASTY Left 05/18/2015   TOTAL HIP ARTHROPLASTY Left 05/18/2015   Procedure: LEFT TOTAL HIP ARTHROPLASTY ANTERIOR APPROACH;  Surgeon: Mcarthur Rossetti, MD;  Location: [place];  Service: Orthopedics;  Laterality: Left;   TOTAL KNEE ARTHROPLASTY Left ~ 2003   TUBAL LIGATION     uterine tumor     VENTRAL HERNIA REPAIR      Allergies  Allergen Reactions   Keflex [Cephalexin] Shortness Of Breath and Other (See Comments)    Chest pain, headache   Lipitor [Atorvastatin] Shortness Of Breath and Other (See Comments)    Severe pain/cramping in legs- "I cannot walk, talk, or breathe" (pt is currently taking 20 mg daily 10/11/19)   Codeine Other (See Comments)    Disrupted patient's equilibrium- "Made my world flip upside down"   Hydrochlorothiazide Other (See Comments)    Other reaction(s): lightheaded, nausea   Hydrocodone-Acetaminophen Other (See Comments)    Other reaction(s): "get crazy"   Metformin Hcl Other (See Comments)    Other reaction(s): upset stomach   Tramadol Hcl Other (See Comments)    Other reaction(s): "get crazy"   Azor [Amlodipine-Olmesartan] Swelling, Palpitations and Rash    Amlodipine alone resumed 12/2013, tolerating   Lisinopril Cough   Penicillins Dermatitis and Other (See Comments)    Did it involve swelling of the face/tongue/throat, SOB, or low BP? No, just worsened  eczema Did it involve sudden or severe rash/hives, skin peeling, or any reaction on the inside of your mouth or nose? No Did you need to seek medical attention at a hospital or doctor's office? No When did it last happen? "More than 10 years ago" If all above answers are "NO", may proceed with cephalosporin use.     Outpatient Encounter Medications as of 03/01/2021  Medication Sig   acetaminophen (TYLENOL) 650 MG CR tablet Take 650 mg by mouth at bedtime as needed for pain.    albuterol (PROVENTIL HFA;VENTOLIN HFA) 108 (90 Base) MCG/ACT inhaler Inhale 2 puffs into the lungs every 4 (four) hours as needed for wheezing or shortness of breath (or coughing).   amLODipine (NORVASC) 10 MG tablet Take 10 mg by mouth daily.   aspirin EC 81 MG EC  tablet Take 1 tablet (81 mg total) by mouth daily. Swallow whole.   bisacodyl (DULCOLAX) 10 MG suppository Place 10 mg rectally as needed for moderate constipation.   busPIRone (BUSPAR) 5 MG tablet Take 1 tablet (5 mg total) by mouth 2 (two) times daily.   clopidogrel (PLAVIX) 75 MG tablet Take 1 tablet (75 mg total) by mouth daily.   feeding supplement, GLUCERNA SHAKE, (GLUCERNA SHAKE) LIQD Take 237 mLs by mouth 2 (two) times daily between meals.   insulin aspart (NOVOLOG) 100 UNIT/ML injection Inject 5 Units into the skin. For CBG equal or greater than 200   isosorbide mononitrate (IMDUR) 30 MG 24 hr tablet Take 1 tablet (30 mg total) by mouth daily.   losartan (COZAAR) 25 MG tablet Take 1 tablet (25 mg total) by mouth daily.   Magnesium Hydroxide (MILK OF MAGNESIA PO) Take 30 mLs by mouth as needed.   Menthol, Topical Analgesic, (BIOFREEZE EX) Apply topically. Apply topically to affected areas three times daily as needed for pain.   NON FORMULARY Diet: Regular diet with thin liquids   pantoprazole (PROTONIX) 40 MG tablet Take 1 tablet (40 mg total) by mouth daily.   rosuvastatin (CRESTOR) 10 MG tablet Take 1 tablet (10 mg total) by mouth at bedtime.   Sodium Phosphates (RA SALINE ENEMA RE) Place 1 Dose rectally as needed.   vitamin B-12 (CYANOCOBALAMIN) 250 MCG tablet Take 1 tablet (250 mcg total) by mouth daily.   furosemide (LASIX) 40 MG tablet Take 1 tablet (40 mg total) by mouth daily for 7 days.   glimepiride (AMARYL) 2 MG tablet Take 1 tablet (2 mg total) by mouth every morning.   potassium chloride SA (KLOR-CON) 20 MEQ tablet Take 1 tablet (20 mEq total) by mouth daily for 7 days.   varenicline (CHANTIX) 0.5 MG tablet Take 1 tablet (0.5 mg total) by mouth 2 (two) times daily. (Patient not taking: Reported on 03/01/2021)   No facility-administered encounter medications on file as of 03/01/2021.    Review of Systems  GENERAL: No change in appetite, no fatigue, no weight changes, no  fever or chills  MOUTH and THROAT: Denies oral discomfort, gingival pain or bleeding  RESPIRATORY: no cough, SOB, DOE, wheezing, hemoptysis CARDIAC: No chest pain, edema or palpitations GI: No abdominal pain, diarrhea, constipation, heart burn, nausea or vomiting GU: Denies dysuria, frequency, hematuria, incontinence, or discharge NEUROLOGICAL: Denies dizziness, syncope, numbness, or headache PSYCHIATRIC: Denies feelings of depression or anxiety. No report of hallucinations, insomnia, paranoia, or agitation   Immunization History  Administered Date(s) Administered   PFIZER(Purple Top)SARS-COV-2 Vaccination 07/24/2019, 08/19/2019   Pneumococcal Conjugate-13 02/26/2015   Pneumococcal Polysaccharide-23 04/19/2010, 05/01/2016  Td 07/22/2010   Pertinent  Health Maintenance Due  Topic Date Due   FOOT EXAM  Never done   OPHTHALMOLOGY EXAM  Never done   DEXA SCAN  Never done   INFLUENZA VACCINE  Never done   HEMOGLOBIN A1C  07/20/2021   Fall Risk  04/16/2015  Falls in the past year? Yes  Number falls in past yr: 1  Injury with Fall? No  Risk for fall due to : Impaired balance/gait     Vitals:   03/01/21 1634  BP: 134/78  Pulse: 88  Resp: 17  Temp: (!) 97.5 F (36.4 C)  Weight: 155 lb 3.2 oz (70.4 kg)  Height: '5\' 7"'  (1.702 m)   Body mass index is 24.31 kg/m.  Physical Exam  GENERAL APPEARANCE: Well nourished. In no acute distress. Normal body habitus SKIN:  Skin is warm and dry.  MOUTH and THROAT: Lips are without lesions. Oral mucosa is moist and without lesions.  RESPIRATORY: Breathing is even & unlabored, BS CTAB CARDIAC: RRR, no murmur,no extra heart sounds, trace edema GI: Abdomen soft, normal BS, no masses, no tenderness EXTREMITIES:  Able to move X 4 extremities NEUROLOGICAL: There is no tremor. Speech is clear. Alert and oriented X 3. PSYCHIATRIC:  Affect and behavior are appropriate  Labs reviewed: Recent Labs    01/18/21 1540 01/18/21 1731  01/20/21 0428 01/21/21 0646 01/22/21 0019 01/24/21 0151 01/27/21 0141 01/31/21 1244 02/17/21 0000  NA 139   < > 140 139   < > 139 137 137 135*  K 4.1   < > 4.6 4.2   < > 4.1 4.5 4.8 5.2  CL 109   < > 104 107   < > 106 103 103 103  CO2 18*   < > 24 23   < > '26 25 26 ' 23*  GLUCOSE 222*   < > 168* 154*   < > 140* 123* 107*  --   BUN 13   < > 26* 30*   < > '18 14 16 ' 33*  CREATININE 1.53*   < > 2.03* 1.84*   < > 1.38* 1.35* 1.61* 3.1*  CALCIUM 8.7*   < > 9.1 8.4*   < > 8.4* 8.3* 9.2 8.7  MG 3.0*  --  2.9* 2.9*  --   --   --   --   --    < > = values in this interval not displayed.   No results for input(s): AST, ALT, ALKPHOS, BILITOT, PROT, ALBUMIN in the last 8760 hours. Recent Labs    09/20/20 0827 01/09/21 2230 01/19/21 0322 01/20/21 0428 01/31/21 1244 02/17/21 0000  WBC 6.1   < > 12.8* 12.6* 10.3 8.2  NEUTROABS 3.4  --   --   --  7.3  --   HGB 13.2   < > 10.8* 10.6* 10.7* 10.1*  HCT 40.6   < > 31.8* 32.9* 33.6* 30*  MCV 91.6   < > 88.1 90.6 91.3  --   PLT 165   < > 135* 157 187 169   < > = values in this interval not displayed.   Lab Results  Component Value Date   TSH 1.248 01/20/2021   Lab Results  Component Value Date   HGBA1C 9.1 (H) 01/17/2021   Lab Results  Component Value Date   CHOL 247 (H) 01/10/2021   HDL 39 (L) 01/10/2021   LDLCALC 180 (H) 01/10/2021   TRIG 139 01/10/2021   CHOLHDL 6.3 01/10/2021  Significant Diagnostic Results in last 30 days:  02/22/2021   -    glucose 88  calcium 9.2   creatinine 3.15  Na 140 K5.1   eGFR <15 02/25/2021      glucose 150   calcium 9.8    creatinine 3.38   BUN 41.2    Na 137   <15  Addendum: 03/02/2021    glucose 140 calcium 9.6    creatinine 4.0  BUN 42.9  Na 140   K4.7   eGFR <15  Assessment/Plan  1. Hyperkalemia -  K 5.4 , will give Kayexalate 15 gm/60 ml PO X 1  2. Acute renal failure superimposed on stage 3b chronic kidney disease, unspecified acute renal failure type (HCC) -  creatinine 3.38, BUN 41.2   GFR <15, 02/25/21 -   will discontinue Losartan and change to Metoprolol tartrate for BP control -  will refer to nephrology   3. Primary hypertension -  will discontinue Losartan due to AKI - will start on metoprolol tartrate (LOPRESSOR) 25 MG tablet; Take 1 tablet (25 mg total) by mouth 2 (two) times daily.  Dispense: 60 tablet; Refill: 0  4. Type 2 diabetes mellitus with other circulatory complication, without long-term current use of insulin (HCC) Lab Results  Component Value Date   HGBA1C 9.1 (H) 01/17/2021   -   CBGs stable -  continue Novolog 5 units SQ BID >=200  5. History of renal artery stenosis -   S/P abdominal aortogram with bilateral renal stent on 06/24/20 by Dr. Ruta Hinds -   Will refer to vascular surgery for follow-up  6. History of NSTEMI CAD S/P CABG X 3 -   follow up with Dr. Terrence Dupont, cardiology -  continue isosorbide MN ER 20 mg daily    Family/ staff Communication:   Repeat BMP done on 03/02/21 showed continued rising of creatinine despite discontinuation of nephrotoxic drugs. She is no longer on Lasix and Amaryl. Discussed lab result with daughter, [name], that it is not recommended to transfer facility at this time for continuity of care.   Labs/tests ordered:    CBC  Goals of care:   Short-term care   Durenda Age, DNP, MSN, FNP-BC [facility] [phone] (Monday-Friday 8:00 a.m. - 5:00 p.m.) [phone] (after hours)

## 2021-03-02 DIAGNOSIS — I10 Essential (primary) hypertension: Secondary | ICD-10-CM | POA: Diagnosis not present

## 2021-03-02 LAB — COMPREHENSIVE METABOLIC PANEL
GFR calc Af Amer: <15
GFR calc non Af Amer: <15

## 2021-03-02 MED ORDER — METOPROLOL TARTRATE 25 MG PO TABS: 25.0000 mg | ORAL_TABLET | Freq: Two times a day (BID) | ORAL | 0 refills | Status: DC

## 2021-03-03 ENCOUNTER — Non-Acute Institutional Stay (SKILLED_NURSING_FACILITY): Payer: Medicare Other | Admitting: Internal Medicine

## 2021-03-03 ENCOUNTER — Encounter: Payer: Self-pay | Admitting: Internal Medicine

## 2021-03-03 DIAGNOSIS — E1351 Other specified diabetes mellitus with diabetic peripheral angiopathy without gangrene: Secondary | ICD-10-CM | POA: Diagnosis not present

## 2021-03-03 DIAGNOSIS — R0789 Other chest pain: Secondary | ICD-10-CM

## 2021-03-03 DIAGNOSIS — N183 Chronic kidney disease, stage 3 unspecified: Secondary | ICD-10-CM | POA: Diagnosis not present

## 2021-03-03 DIAGNOSIS — N179 Acute kidney failure, unspecified: Secondary | ICD-10-CM | POA: Diagnosis not present

## 2021-03-03 LAB — CBC AND DIFFERENTIAL
HCT: 33 — AB (ref 36–46)
Hemoglobin: 11 — AB (ref 12.0–16.0)
Platelets: 196 (ref 150–399)
WBC: 9.4

## 2021-03-03 LAB — CBC: RBC: 3.74 — AB (ref 3.87–5.11)

## 2021-03-03 NOTE — Assessment & Plan Note (Signed)
DM with neuro, renal & vascular complications Glucose range as IP:98-245 Current A1c: 9.1% A1c goal : < 8% No hypoglycemia Glimepiride was weaned and discontinued because of progressive AKI Low-dose Lantus insulin was initiated

## 2021-03-03 NOTE — Assessment & Plan Note (Signed)
03/03/2021 patient describes nonradiating chest pain at the incision site overnight.  Exam revealed tenderness to palpation.  Anginal variant not suspect despite her documented vasculopathy and post CABG status.

## 2021-03-03 NOTE — Assessment & Plan Note (Addendum)
Nephrology input critical; she had been seen previously in 2021 apparently.  I am concerned that the renal artery stents are occluding/occluded.

## 2021-03-03 NOTE — Patient Instructions (Signed)
See assessment and plan under each diagnosis in the problem list and acutely for this visit 

## 2021-03-03 NOTE — Progress Notes (Signed)
NURSING HOME LOCATION:  [facility] ROOM NUMBER:  307 [id number]  CODE STATUS:  DNR  PCP:  [name] MD  This is a nursing facility follow up visit for specific acute issue of progressive renal insufficiency.  Interim medical record and care since last SNF visit was updated with review of diagnostic studies and change in clinical status since last visit were documented.  HPI: She was admitted to this facility for rehab 9/1.  She had been hospitalized 8/14 - 01/27/2021 with coronary artery disease.  Diffuse symptomatic CAD resulted in CABG x3.  While hospitalized she also exhibited AKI with creatinine up to 2.03.  With rehydration the creatinine did drop to 1.35.  This was in the context of a history of renal artery stenosis for which stents were placed 06/25/2019.   PMH includes left femoral to below-knee popliteal BPG 07/06/2014 .  She also has a history of aortic aneurysm repair in 2007.  She also has a past medical history of TIAs. Here at the SNF she has had progressive increase in the creatinine with expected drop in GFR despite deprescribing any potentially nephrotoxic medications.  Specifically on 9/22 creatinine was 3.1; it was 3.38 on 9/30 with a GFR less than 15.  Creatinine yesterday 10/5 was 4.00 , again with a GFR less than 15. She states she had been seen by Nephrology apparently in 2021 but no definitive intervention was pursued.Records were not found in Epic. Medications which were weaned and finally discontinued include furosemide, glimepiride, losartan, magnesium, and potassium supplementation. Low dose Metoprolol was initiated for HTN in context of CAD.  Review of systems: When I entered the room the patient was sitting in a wheelchair watching TV.  The room was extremely warm.  She describes cold intolerance especially after she takes a shower.  She had requested the room temperature to be increased. TSH was therapeutic in August.   She also complains of  anterior chest pain at the incision site since last night.  She stated that it was cool last night and this seemed to initiate the "throbbing" pain in the area of the incision.  It was nonradiating to the neck or left upper extremity and nonexertional.   This morning she states that she has a "queasy" stomach after drinking milk.  This has been an intermittent phenomenon at home as milk causes "gas". She describes increased ice intake and thirst.  Constitutional: No fever, significant weight change  Cardiovascular: No  palpitations, paroxysmal nocturnal dyspnea Respiratory: No cough, sputum production, hemoptysis, DOE   Gastrointestinal: No heartburn, dysphagia, abdominal pain, vomiting, rectal bleeding, melena, change in bowels Genitourinary: No dysuria, hematuria, pyuria Musculoskeletal: No joint stiffness, joint swelling, weakness, pain Dermatologic: No rash, pruritus, change in appearance of skin Neurologic: No dizziness, headache, syncope, numbness, tingling Endocrine: No change in hair/skin/nails, excessive hunger, excessive urination  Hematologic/lymphatic: No significant bruising, lymphadenopathy, abnormal bleeding  Physical exam:  Pertinent or positive findings: She appears younger than her stated age.  She appears fairly healthy in spite of the complex and complicated history.  She has an upper plate.  She has a few remaining anterior mandibular teeth.  The second heart sound is slightly increased.  Chest was surprisingly clear.  There was tenderness around the incision site to palpation.  Pedal pulses were not palpable.  She has trace pedal edema.  General appearance: Adequately nourished; no acute distress, increased work of breathing is present.   Lymphatic: No lymphadenopathy about the head, neck,  axilla. Eyes: No conjunctival inflammation or lid edema is present. There is no scleral icterus. Ears:  External ear exam shows no significant lesions or deformities.   Nose:  External  nasal examination shows no deformity or inflammation. Nasal mucosa are pink and moist without lesions, exudates Oral exam:  Lips and gums are healthy appearing. There is no oropharyngeal erythema or exudate. Neck:  No thyromegaly, masses, tenderness noted.    Heart:  Normal rate and regular rhythm. S1 normal without gallop, murmur, click, rub .  Lungs: without wheezes, rhonchi, rales, rubs. Abdomen: Bowel sounds are normal. Abdomen is soft and nontender with no organomegaly, hernias, masses. GU: Deferred  Extremities:  No cyanosis, clubbing  Skin: Warm & dry w/o tenting. No significant lesions or rash.  See summary under each active problem in the Problem List with associated updated therapeutic plan

## 2021-03-07 LAB — COMPREHENSIVE METABOLIC PANEL: Calcium: 9.2 (ref 8.7–10.7)

## 2021-03-07 LAB — BASIC METABOLIC PANEL
BUN: 43 — AB (ref 4–21)
CO2: 19 (ref 13–22)
Chloride: 108 (ref 99–108)
Creatinine: 3.8 — AB (ref 0.5–1.1)
Glucose: 184
Potassium: 4.7 (ref 3.4–5.3)
Sodium: 137 (ref 137–147)

## 2021-03-09 ENCOUNTER — Non-Acute Institutional Stay (SKILLED_NURSING_FACILITY): Payer: Medicare Other | Admitting: Internal Medicine

## 2021-03-09 ENCOUNTER — Encounter: Payer: Self-pay | Admitting: Internal Medicine

## 2021-03-09 DIAGNOSIS — N179 Acute kidney failure, unspecified: Secondary | ICD-10-CM | POA: Diagnosis not present

## 2021-03-09 DIAGNOSIS — D62 Acute posthemorrhagic anemia: Secondary | ICD-10-CM

## 2021-03-09 DIAGNOSIS — F321 Major depressive disorder, single episode, moderate: Secondary | ICD-10-CM | POA: Diagnosis not present

## 2021-03-09 DIAGNOSIS — R0789 Other chest pain: Secondary | ICD-10-CM | POA: Diagnosis not present

## 2021-03-09 NOTE — Assessment & Plan Note (Signed)
Trial of low dose Sertraline.

## 2021-03-09 NOTE — Progress Notes (Signed)
NURSING HOME LOCATION:  [facility] ROOM NUMBER:  [id number]  CODE STATUS:  DNR  PCP:  [name] MD  This is a nursing facility follow up visit for ESRD.  Interim medical record and care since last SNF visit was updated with review of diagnostic studies and change in clinical status since last visit were documented.  HPI: She has exhibited progressive AKI with now ESRD.  Over the weekend 10/7-9 she received 1 L of normal saline.  She is also been encouraged to drink to thirst, up to 40 ounces of fluids daily.  Repeat renal function 10/10 revealed a creatinine had dropped slightly from 4.00-3.82.  GFR was 11; prior value was  "less than 15". She states that she does have oliguria if she does drink sufficient water.  She has no other GU symptoms. She denies any cardiopulmonary symptoms; she does have localized left chest wall pain.  This is aggravated by change in position but not exertion. She states that she ambulates with her walker approximately 50 feet or less w/o DOE or chest pain.  PT/OT verified that she is now at high level of independence. She had been living with her son and daughter-in-law in their home; until her son died of colon cancer in 11-Jun-2022 of this year and the house was listed for sale as the daughter-in-law is moving to [place].  Family is looking at various ALF facilities such as [facility] and [facility]; Social Services here at this SNF has had difficulty getting responses from those facilities.  Review of systems: As noted she denies exertional chest pain with ambulation.  She states that the chest pain is worse if the room is cool. She does state  she is "just tired and I cry a lot".  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  The oldest apparently was deaf and was placed in a facility that could ensure his education.  She has lost 2 sons to cancer; 1 with colon cancer and a second of unknown primary.  She has 2 daughters, 1 of  whom is a Quarry manager.  Constitutional: No fever, significant weight change, fatigue  Eyes: No redness, discharge, pain, vision change ENT/mouth: No nasal congestion,  purulent discharge, earache, change in hearing, sore throat  Cardiovascular: No palpitations, paroxysmal nocturnal dyspnea, claudication Respiratory: No cough, sputum production, hemoptysis, significant snoring, apnea   Gastrointestinal: No heartburn, dysphagia, abdominal pain, nausea /vomiting, rectal bleeding, melena, change in bowels Genitourinary: No dysuria, hematuria, pyuria, incontinence, nocturia Dermatologic: No rash, pruritus, change in appearance of skin Neurologic: No dizziness, headache, syncope, seizures, numbness, tingling Psychiatric: No significant insomnia, anorexia Endocrine: No change in hair/skin/nails, excessive thirst, excessive hunger, excessive urination  Hematologic/lymphatic: No significant bruising, lymphadenopathy, abnormal bleeding  Physical exam:  Pertinent or positive findings: She was in the bedside chair wearing bedclothes as well as a robe despite the high ambient room temperature.  There is thinning of the eyebrows laterally.  She has ptosis greater on the left.  She has an upper plate.  Lower teeth are immaculate.  She has tenderness to deep palpation in the left parasternal area and with passive extension of the extremities posteriorly.  Pedal pulses are decreased.  She has 1/2+ edema of the left ankle and foot.  General appearance: Adequately nourished; no acute distress, increased work of breathing is present.   Lymphatic: No lymphadenopathy about the head, neck, axilla. Eyes: No conjunctival inflammation or lid edema is present. There is  no scleral icterus. Ears:  External ear exam shows no significant lesions or deformities.   Nose:  External nasal examination shows no deformity or inflammation. Nasal mucosa are pink and moist without lesions, exudates Oral exam:  Lips and gums are healthy  appearing. There is no oropharyngeal erythema or exudate. Neck:  No thyromegaly, masses, tenderness noted.    Heart:  Normal rate and regular rhythm. S1 and S2 normal without gallop, murmur, click, rub .  Lungs: Chest clear to auscultation without wheezes, rhonchi, rales, rubs. Abdomen: Bowel sounds are normal. Abdomen is soft and nontender with no organomegaly, hernias, masses. GU: Deferred  Extremities:  No cyanosis, clubbing  Skin: Warm & dry w/o tenting. No significant lesions or rash.  See summary under each active problem in the Problem List with associated updated therapeutic plan

## 2021-03-09 NOTE — Assessment & Plan Note (Addendum)
Renal function is stable to possibly slightly improved.  Nephrology consultation apparently has been requested by her PCP. Doppler is unlikely to be of value as she has had stenting of the renal arteries.  Most likely a contrast agent study would be necessary; this would require the guidance of a Nephrologist. She'll continue to drink to thirst. Pulse diuretic therapy based on weight & edema.

## 2021-03-09 NOTE — Assessment & Plan Note (Signed)
03/03/21 H/H 11/32.9 Indices normochromic/cytic Anemia improved  ROS negative for bleeding dyscrasias & none reported by Staff Monitor CBC

## 2021-03-09 NOTE — Patient Instructions (Signed)
See assessment and plan under each diagnosis in the problem list and acutely for this visit 

## 2021-03-09 NOTE — Assessment & Plan Note (Signed)
She describes nonradiating left chest pain worse with movement.  Pain is exacerbated by deep palpation or by extension of the upper extremities posteriorly.  Classic costochondritis suggested in the context of recent CABG. Trial of Voltaren gel bid .

## 2021-03-11 ENCOUNTER — Encounter: Payer: Self-pay | Admitting: Adult Health

## 2021-03-11 ENCOUNTER — Ambulatory Visit: Payer: Self-pay | Admitting: Thoracic Surgery (Cardiothoracic Vascular Surgery)

## 2021-03-11 ENCOUNTER — Non-Acute Institutional Stay (SKILLED_NURSING_FACILITY): Payer: Medicare Other | Admitting: Adult Health

## 2021-03-11 DIAGNOSIS — I10 Essential (primary) hypertension: Secondary | ICD-10-CM

## 2021-03-11 DIAGNOSIS — Z8679 Personal history of other diseases of the circulatory system: Secondary | ICD-10-CM | POA: Diagnosis not present

## 2021-03-11 DIAGNOSIS — K219 Gastro-esophageal reflux disease without esophagitis: Secondary | ICD-10-CM

## 2021-03-11 DIAGNOSIS — I25110 Atherosclerotic heart disease of native coronary artery with unstable angina pectoris: Secondary | ICD-10-CM | POA: Diagnosis not present

## 2021-03-11 DIAGNOSIS — E1351 Other specified diabetes mellitus with diabetic peripheral angiopathy without gangrene: Secondary | ICD-10-CM

## 2021-03-11 DIAGNOSIS — Z951 Presence of aortocoronary bypass graft: Secondary | ICD-10-CM

## 2021-03-11 DIAGNOSIS — F321 Major depressive disorder, single episode, moderate: Secondary | ICD-10-CM

## 2021-03-11 DIAGNOSIS — I252 Old myocardial infarction: Secondary | ICD-10-CM | POA: Diagnosis not present

## 2021-03-11 DIAGNOSIS — N179 Acute kidney failure, unspecified: Secondary | ICD-10-CM

## 2021-03-11 NOTE — Progress Notes (Signed)
Location:  [facility] Room Number: 341-D Place of Service:  SNF (31) Provider:  Durenda Age, DNP, FNP-BC  Patient Care Team: Laura Jordan, MD as PCP - General (Family Medicine) Laura Forward, MD as Attending Physician (Internal Medicine) Laura Rossetti, MD as Consulting Physician (Orthopedic Surgery) Lbcardiology, Rounding, MD (Cardiology)  Extended Emergency Contact Information Primary Emergency Contact: Lacey of [place] Work Phone: [phone] Mobile Phone: [phone] Relation: Daughter  Code Status: DNR   Goals of care: Advanced Directive information Advanced Directives 03/11/2021  Does Patient Have a Medical Advance Directive? Yes  Type of Paramedic of [place];Living will;Out of facility DNR (pink MOST or yellow form)  Does patient want to make changes to medical advance directive? No - Patient declined  Copy of [place] in Chart? Yes - validated most recent copy scanned in chart (See row information)  Would patient like information on creating a medical advance directive? -     Chief Complaint  Patient presents with   Discharge Note    HPI:  Pt is a 80 y.o. female who is for transfer to [facility] SNF on 03/14/21.  She was admitted to [facility] on 01/27/21 post hospital admission 01/09/2021 to 01/27/21 for chest pain.  She was seen by cardiologist, Dr. Terrence Dupont, and cardiac catheterization was performed which revealed diffuse vascular lesions with blockage of 70 to 90%.  On 8/22, she had CABG x3, LIMA to LAD, reverse saphenous vein graft to the distal right coronary artery, reverse saphenous vein graft to OM1.  Postop, she was transfused 2 units PRBC due to acute blood loss.  She had congestion.  Imaging revealed atelectasis and small effusions.  Lasix was started.  Hospitalization was complicated by AKI with creatinine up to 2.03.   She was given IV fluids and creatinine improved to 1.35.  She had numbness on her hands.  Neurology was consulted.  Symptoms resolved spontaneously.  She had an episode of atrial fibrillation and was given IV amiodarone.  She had bradycardia so beta-blocker was not initiated.  A [facility], she had progressive AKI and was given 1 L of normal saline repeat renal function on 10/10 revealed a creatinine drop from 4.0-3.82, GFR was 11 with prior value less than 15. Amaryl was discontinued due to AKI and shifted to Novolog.  Daughter requested resident to transfer to [facility].  Past Medical History:  Diagnosis Date   AAA (abdominal aortic aneurysm)    Arthritis    osteoarthritis   Constipation    DM with vascular complications    Type 2, on Glimiperide   GERD (gastroesophageal reflux disease)    Glaucoma    Hyperlipidemia    Hypertension    Kidney stones    Myocardial infarction [facility])    Peripheral vascular disease ([place])    Pneumonia    Stroke ([place]) 01/28/2015   with left side weakness    TIA (transient ischemic attack)    Urinary incontinence    Past Surgical History:  Procedure Laterality Date   ABDOMINAL AORTAGRAM N/A 03/13/2014   Procedure: ABDOMINAL Maxcine Ham;  Surgeon: Elam Dutch, MD;  Location: [facility] C[facility] LAB;  Service: Cardiovascular;  Laterality: N/A;   ABDOMINAL AORTIC ANEURYSM REPAIR  ?2012   ABDOMINAL HYSTERECTOMY     partial   BACK SURGERY     CARDIAC CATHETERIZATION     CORONARY ANGIOPLASTY  03/30/2015   CORONARY ARTERY BYPASS GRAFT N/A 01/17/2021   Procedure: CORONARY  ARTERY BYPASS GRAFTING (CABG) TIMES 3 , ON PUMP, USING LEFT INTERNAL MAMMARY ARTERY AND ENDOSCOPICALLY HARVESTED RIGHT GREATER SAPHENOUS VEIN;  Surgeon: Lajuana Matte, MD;  Location: [facility];  Service: Open Heart Surgery;  Laterality: N/A;   ENDOVEIN HARVEST OF GREATER SAPHENOUS VEIN Right 01/17/2021   Procedure: ENDOVEIN HARVEST OF GREATER SAPHENOUS VEIN;  Surgeon: Lajuana Matte, MD;   Location: [facility];  Service: Open Heart Surgery;  Laterality: Right;   EYE SURGERY Right    laser surgery for blood behind eye, loss of sight   FEMORAL-POPLITEAL BYPASS GRAFT Left 07/06/2014   Procedure: BYPASS GRAFT FEMORAL-POPLITEAL ARTERY;  Surgeon: Elam Dutch, MD;  Location: [facility] OR;  Service: Vascular;  Laterality: Left;   HERNIA REPAIR     JOINT REPLACEMENT     LEFT HEART CATH AND CORONARY ANGIOGRAPHY N/A 01/11/2021   Procedure: LEFT HEART CATH AND CORONARY ANGIOGRAPHY;  Surgeon: Laura Forward, MD;  Location: [facility] CV LAB;  Service: Cardiovascular;  Laterality: N/A;   LEFT HEART CATHETERIZATION WITH CORONARY ANGIOGRAM N/A 04/12/2014   Procedure: LEFT HEART CATHETERIZATION WITH CORONARY ANGIOGRAM;  Surgeon: Clent Demark, MD;  Location: [facility] CATH LAB;  Service: Cardiovascular;  Laterality: N/A;   PERCUTANEOUS CORONARY STENT INTERVENTION (PCI-S)  04/12/2014   Procedure: PERCUTANEOUS CORONARY STENT INTERVENTION (PCI-S);  Surgeon: Clent Demark, MD;  Location: [facility] CATH LAB;  Service: Cardiovascular;;  prox and mid RCA   PERIPHERAL VASCULAR INTERVENTION  06/25/2019   Procedure: PERIPHERAL VASCULAR INTERVENTION;  Surgeon: Elam Dutch, MD;  Location: [facility] CV LAB;  Service: Cardiovascular;;  Lt Renal and Rt Renal   RENAL ANGIOGRAPHY Bilateral 06/25/2019   Procedure: RENAL ANGIOGRAPHY;  Surgeon: Elam Dutch, MD;  Location: [place] CV LAB;  Service: Cardiovascular;  Laterality: Bilateral;   TEE WITHOUT CARDIOVERSION N/A 01/17/2021   Procedure: TRANSESOPHAGEAL ECHOCARDIOGRAM (TEE);  Surgeon: Lajuana Matte, MD;  Location: [facility];  Service: Open Heart Surgery;  Laterality: N/A;   TOTAL HIP ARTHROPLASTY Left 05/18/2015   Procedure: LEFT TOTAL HIP ARTHROPLASTY ANTERIOR APPROACH;  Surgeon: Laura Rossetti, MD;  Location: [facility];  Service: Orthopedics;  Laterality: Left;   TOTAL KNEE ARTHROPLASTY Left ~ 2003   TUBAL LIGATION     uterine tumor     VENTRAL  HERNIA REPAIR      Allergies  Allergen Reactions   Keflex [Cephalexin] Shortness Of Breath and Other (See Comments)    Chest pain, headache   Lipitor [Atorvastatin] Shortness Of Breath and Other (See Comments)    Severe pain/cramping in legs- "I cannot walk, talk, or breathe" (pt is currently taking 20 mg daily 10/11/19)   Codeine Other (See Comments)    Disrupted patient's equilibrium- "Made my world flip upside down"   Hydrochlorothiazide Other (See Comments)    Other reaction(s): lightheaded, nausea   Hydrocodone-Acetaminophen Other (See Comments)    Other reaction(s): "get crazy"   Metformin Hcl Other (See Comments)    Other reaction(s): upset stomach   Tramadol Hcl Other (See Comments)    Other reaction(s): "get crazy"   Azor [Amlodipine-Olmesartan] Swelling, Palpitations and Rash    Amlodipine alone resumed 12/2013, tolerating   Lisinopril Cough   Penicillins Dermatitis and Other (See Comments)    Did it involve swelling of the face/tongue/throat, SOB, or low BP? No, just worsened eczema Did it involve sudden or severe rash/hives, skin peeling, or any reaction on the inside of your mouth or nose? No Did you need to seek medical attention  at a hospital or doctor's office? No When did it last happen? "More than 10 years ago" If all above answers are "NO", may proceed with cephalosporin use.     Outpatient Encounter Medications as of 03/11/2021  Medication Sig   acetaminophen (TYLENOL) 650 MG CR tablet Take 650 mg by mouth at bedtime as needed for pain.    albuterol (PROVENTIL HFA;VENTOLIN HFA) 108 (90 Base) MCG/ACT inhaler Inhale 2 puffs into the lungs every 4 (four) hours as needed for wheezing or shortness of breath (or coughing).   amLODipine (NORVASC) 10 MG tablet Take 10 mg by mouth daily.   aspirin EC 81 MG EC tablet Take 1 tablet (81 mg total) by mouth daily. Swallow whole.   bisacodyl (DULCOLAX) 10 MG suppository Place 10 mg rectally as needed for moderate constipation.    busPIRone (BUSPAR) 5 MG tablet Take 1 tablet (5 mg total) by mouth 2 (two) times daily.   clopidogrel (PLAVIX) 75 MG tablet Take 1 tablet (75 mg total) by mouth daily.   diclofenac Sodium (VOLTAREN) 1 % GEL Apply topically. Apply to left anterior chest twice a day for pain r/t costochondritis   feeding supplement, GLUCERNA SHAKE, (GLUCERNA SHAKE) LIQD Take 237 mLs by mouth 2 (two) times daily between meals.   insulin aspart (NOVOLOG) 100 UNIT/ML injection Inject 5 Units into the skin. For CBG equal or greater than 200   isosorbide mononitrate (IMDUR) 30 MG 24 hr tablet Take 1 tablet (30 mg total) by mouth daily.   magnesium hydroxide (MILK OF MAGNESIA) 400 MG/5ML suspension If no BM in 3 days, give 30 cc Milk of Magnesium p.o. x 1 dose in 24 hours as needed   Menthol, Topical Analgesic, (BIOFREEZE EX) Apply topically. Apply topically to affected areas three times daily as needed for pain.   metoprolol tartrate (LOPRESSOR) 25 MG tablet Take 1 tablet (25 mg total) by mouth 2 (two) times daily.   NON FORMULARY Diet: Regular diet with thin liquids   pantoprazole (PROTONIX) 40 MG tablet Take 1 tablet (40 mg total) by mouth daily.   rosuvastatin (CRESTOR) 10 MG tablet Take 1 tablet (10 mg total) by mouth at bedtime.   sertraline (ZOLOFT) 25 MG tablet Take 25 mg by mouth daily.   sertraline (ZOLOFT) 50 MG tablet Take 50 mg by mouth daily.   Sodium Phosphates (RA SALINE ENEMA RE) Place 1 Dose rectally as needed.   vitamin B-12 (CYANOCOBALAMIN) 250 MCG tablet Take 1 tablet (250 mcg total) by mouth daily.   No facility-administered encounter medications on file as of 03/11/2021.    Review of Systems  GENERAL: No change in appetite, no fatigue, no weight changes, no fever, chills or weakness MOUTH and THROAT: Denies oral discomfort, gingival pain or bleeding RESPIRATORY: no cough, SOB, DOE, wheezing, hemoptysis CARDIAC: No chest pain, edema or palpitations GI: No abdominal pain, diarrhea,  constipation, heart burn, nausea or vomiting GU: Denies dysuria, frequency, hematuria, incontinence, or discharge NEUROLOGICAL: Denies dizziness, syncope, numbness, or headache PSYCHIATRIC: Denies feelings of depression or anxiety. No report of hallucinations, insomnia, paranoia, or agitation    Immunization History  Administered Date(s) Administered   PFIZER(Purple Top)SARS-COV-2 Vaccination 07/24/2019, 08/19/2019   Pneumococcal Conjugate-13 02/26/2015   Pneumococcal Polysaccharide-23 04/19/2010, 05/01/2016   Td 07/22/2010   Pertinent  Health Maintenance Due  Topic Date Due   FOOT EXAM  Never done   OPHTHALMOLOGY EXAM  Never done   URINE MICROALBUMIN  Never done   DEXA SCAN  Never done  INFLUENZA VACCINE  Never done   HEMOGLOBIN A1C  07/20/2021   Fall Risk  04/16/2015  Falls in the past year? Yes  Number falls in past yr: 1  Injury with Fall? No  Risk for fall due to : Impaired balance/gait     Vitals:   03/11/21 1119  BP: (!) 141/79  Pulse: 82  Resp: 18  Temp: 97.7 F (36.5 C)  Weight: 157 lb 12.8 oz (71.6 kg)  Height: 5\' 7"  (1.702 m)   Body mass index is 24.71 kg/m.  Physical Exam  GENERAL APPEARANCE: Well nourished. In no acute distress. Normal body habitus SKIN:  Skin is warm and dry.  MOUTH and THROAT: Lips are without lesions. Oral mucosa is moist and without lesions.  RESPIRATORY: Breathing is even & unlabored, BS CTAB CARDIAC: RRR, no murmur,no extra heart sounds, no edema GI: Abdomen soft, normal BS, no masses, no tenderness EXTREMITIES:  Able to move X 4 extremities NEUROLOGICAL: There is no tremor. Speech is clear. Alert and oriented X 3 PSYCHIATRIC:  Affect and behavior are appropriate  Labs reviewed: Recent Labs    01/18/21 1540 01/18/21 1731 01/20/21 0428 01/21/21 0646 01/22/21 0019 01/24/21 0151 01/27/21 0141 01/31/21 1244 02/17/21 0000 03/07/21 0000  NA 139   < > 140 139   < > 139 137 137 135* 137  K 4.1   < > 4.6 4.2   < > 4.1  4.5 4.8 5.2 4.7  CL 109   < > 104 107   < > 106 103 103 103 108  CO2 18*   < > 24 23   < > 26 25 26  23* 19  GLUCOSE 222*   < > 168* 154*   < > 140* 123* 107*  --   --   BUN 13   < > 26* 30*   < > 18 14 16  33* 43*  CREATININE 1.53*   < > 2.03* 1.84*   < > 1.38* 1.35* 1.61* 3.1* 3.8*  CALCIUM 8.7*   < > 9.1 8.4*   < > 8.4* 8.3* 9.2 8.7 9.2  MG 3.0*  --  2.9* 2.9*  --   --   --   --   --   --    < > = values in this interval not displayed.   No results for input(s): AST, ALT, ALKPHOS, BILITOT, PROT, ALBUMIN in the last 8760 hours. Recent Labs    09/20/20 0827 01/09/21 2230 01/19/21 0322 01/20/21 0428 01/31/21 1244 02/17/21 0000 03/03/21 0000  WBC 6.1   < > 12.8* 12.6* 10.3 8.2 9.4  NEUTROABS 3.4  --   --   --  7.3  --   --   HGB 13.2   < > 10.8* 10.6* 10.7* 10.1* 11.0*  HCT 40.6   < > 31.8* 32.9* 33.6* 30* 33*  MCV 91.6   < > 88.1 90.6 91.3  --   --   PLT 165   < > 135* 157 187 169 196   < > = values in this interval not displayed.   Lab Results  Component Value Date   TSH 1.248 01/20/2021   Lab Results  Component Value Date   HGBA1C 9.1 (H) 01/17/2021   Lab Results  Component Value Date   CHOL 247 (H) 01/10/2021   HDL 39 (L) 01/10/2021   LDLCALC 180 (H) 01/10/2021   TRIG 139 01/10/2021   CHOLHDL 6.3 01/10/2021    Significant Diagnostic Results in last 30  days:  No results found.  Assessment/Plan  1. History of non-ST elevation myocardial infarction (NSTEMI) Coronary artery disease involving native coronary artery of native heart with unstable angina pectoris (HCC)  S/P CABG x 3 -  continue Plavix 75 mg daily, ASA EC 81 mg daily, Isosorbide MN ER 30 mg daily, Crestor 10 mg at bedtime -  follow up with cardiology, Dr. Terrence Dupont  2. AKI (acute kidney injury) [facility]) Lab Results  Component Value Date   NA 137 03/07/2021   K 4.7 03/07/2021   CO2 19 03/07/2021   GLUCOSE 107 (H) 01/31/2021   BUN 43 (A) 03/07/2021   CREATININE 3.8 (A) 03/07/2021   CALCIUM 9.2  03/07/2021   GFRNONAA <15.0 03/02/2021   -   S/P 0.9NS IVF X 1L, Losartan was discontinued and shifted to Metoprolol 25 mg BID -    referred for nephrology consult  3. Primary hypertension -  continue Amlodipine 10 mg daily, Losartan was discontinued due to AKI and shifted to Metoprolol 25 mg BID  4. Gastroesophageal reflux disease without esophagitis -  continue Protonix 40 mg daily  5. History of renal artery stenosis -   S/P abdominal aortogram with bilateral renal stent on 06/24/20 by Dr. Ruta Hinds -   was referred for vascular surgery follow up  6. Depression, major, single episode, moderate (HCC) -  PHQ score is 0, continue Sertraline 25 mg daily till 03/16/21 then Sertraline 50 mg daily starting on 03/17/21 -  continue Buspirone 5 mg daily  7. DM (diabetes mellitus), secondary, with peripheral vascular complications ([place]) Lab Results  Component Value Date   HGBA1C 9.1 (H) 01/17/2021   -  Amaryl was discontinued and shifted to Novolog 5 units SQ TIDac for CBG=>200     I have filled out patient's discharge paperwork.     DME provided: None   Total discharge time: Great than 30 minutes Greater than 50% was spent in counseling and coordination of care.   Discharge time involved coordination of the discharge process with social worker, nursing staff and therapy department.     Durenda Age, DNP, MSN, FNP-BC [facility] [phone] (Monday-Friday 8:00 a.m. - 5:00 p.m.) [phone] (after hours)

## 2021-03-14 ENCOUNTER — Other Ambulatory Visit: Payer: Self-pay | Admitting: Thoracic Surgery (Cardiothoracic Vascular Surgery)

## 2021-03-14 DIAGNOSIS — Z951 Presence of aortocoronary bypass graft: Secondary | ICD-10-CM

## 2021-03-15 DIAGNOSIS — R278 Other lack of coordination: Secondary | ICD-10-CM | POA: Diagnosis not present

## 2021-03-15 DIAGNOSIS — M6281 Muscle weakness (generalized): Secondary | ICD-10-CM | POA: Diagnosis not present

## 2021-03-16 DIAGNOSIS — I152 Hypertension secondary to endocrine disorders: Secondary | ICD-10-CM | POA: Diagnosis not present

## 2021-03-16 DIAGNOSIS — E1159 Type 2 diabetes mellitus with other circulatory complications: Secondary | ICD-10-CM | POA: Diagnosis not present

## 2021-03-16 DIAGNOSIS — I679 Cerebrovascular disease, unspecified: Secondary | ICD-10-CM | POA: Diagnosis not present

## 2021-03-16 DIAGNOSIS — I2510 Atherosclerotic heart disease of native coronary artery without angina pectoris: Secondary | ICD-10-CM | POA: Diagnosis not present

## 2021-03-16 DIAGNOSIS — M6281 Muscle weakness (generalized): Secondary | ICD-10-CM | POA: Diagnosis not present

## 2021-03-16 DIAGNOSIS — F17200 Nicotine dependence, unspecified, uncomplicated: Secondary | ICD-10-CM | POA: Diagnosis not present

## 2021-03-16 DIAGNOSIS — R1310 Dysphagia, unspecified: Secondary | ICD-10-CM | POA: Diagnosis not present

## 2021-03-16 DIAGNOSIS — R278 Other lack of coordination: Secondary | ICD-10-CM | POA: Diagnosis not present

## 2021-03-16 DIAGNOSIS — R6881 Early satiety: Secondary | ICD-10-CM | POA: Diagnosis not present

## 2021-03-16 DIAGNOSIS — Z951 Presence of aortocoronary bypass graft: Secondary | ICD-10-CM | POA: Diagnosis not present

## 2021-03-16 DIAGNOSIS — I739 Peripheral vascular disease, unspecified: Secondary | ICD-10-CM | POA: Diagnosis not present

## 2021-03-16 DIAGNOSIS — N183 Chronic kidney disease, stage 3 unspecified: Secondary | ICD-10-CM | POA: Diagnosis not present

## 2021-03-17 DIAGNOSIS — R278 Other lack of coordination: Secondary | ICD-10-CM | POA: Diagnosis not present

## 2021-03-17 DIAGNOSIS — M6281 Muscle weakness (generalized): Secondary | ICD-10-CM | POA: Diagnosis not present

## 2021-03-18 ENCOUNTER — Emergency Department (HOSPITAL_COMMUNITY): Payer: Medicare Other

## 2021-03-18 ENCOUNTER — Encounter: Payer: Self-pay | Admitting: Thoracic Surgery (Cardiothoracic Vascular Surgery)

## 2021-03-18 ENCOUNTER — Other Ambulatory Visit: Payer: Self-pay

## 2021-03-18 ENCOUNTER — Encounter (HOSPITAL_COMMUNITY): Payer: Self-pay | Admitting: *Deleted

## 2021-03-18 ENCOUNTER — Inpatient Hospital Stay (HOSPITAL_COMMUNITY)
Admission: EM | Admit: 2021-03-18 | Discharge: 2021-03-22 | DRG: 177 | Disposition: A | Discharge status: Skilled Nursing Facility | Payer: Medicare Other | Source: Skilled Nursing Facility | Attending: Internal Medicine | Admitting: Internal Medicine

## 2021-03-18 DIAGNOSIS — Z888 Allergy status to other drugs, medicaments and biological substances status: Secondary | ICD-10-CM

## 2021-03-18 DIAGNOSIS — I739 Peripheral vascular disease, unspecified: Secondary | ICD-10-CM | POA: Diagnosis not present

## 2021-03-18 DIAGNOSIS — E11649 Type 2 diabetes mellitus with hypoglycemia without coma: Secondary | ICD-10-CM | POA: Diagnosis present

## 2021-03-18 DIAGNOSIS — H409 Unspecified glaucoma: Secondary | ICD-10-CM | POA: Diagnosis not present

## 2021-03-18 DIAGNOSIS — I679 Cerebrovascular disease, unspecified: Secondary | ICD-10-CM | POA: Diagnosis not present

## 2021-03-18 DIAGNOSIS — R6883 Chills (without fever): Secondary | ICD-10-CM | POA: Diagnosis not present

## 2021-03-18 DIAGNOSIS — Z96642 Presence of left artificial hip joint: Secondary | ICD-10-CM | POA: Diagnosis not present

## 2021-03-18 DIAGNOSIS — R531 Weakness: Secondary | ICD-10-CM | POA: Diagnosis not present

## 2021-03-18 DIAGNOSIS — G459 Transient cerebral ischemic attack, unspecified: Secondary | ICD-10-CM | POA: Diagnosis not present

## 2021-03-18 DIAGNOSIS — Z794 Long term (current) use of insulin: Secondary | ICD-10-CM

## 2021-03-18 DIAGNOSIS — Z743 Need for continuous supervision: Secondary | ICD-10-CM | POA: Diagnosis not present

## 2021-03-18 DIAGNOSIS — R410 Disorientation, unspecified: Secondary | ICD-10-CM | POA: Diagnosis not present

## 2021-03-18 DIAGNOSIS — U071 COVID-19: Principal | ICD-10-CM

## 2021-03-18 DIAGNOSIS — I2510 Atherosclerotic heart disease of native coronary artery without angina pectoris: Secondary | ICD-10-CM | POA: Diagnosis present

## 2021-03-18 DIAGNOSIS — Z823 Family history of stroke: Secondary | ICD-10-CM | POA: Diagnosis not present

## 2021-03-18 DIAGNOSIS — N281 Cyst of kidney, acquired: Secondary | ICD-10-CM | POA: Diagnosis not present

## 2021-03-18 DIAGNOSIS — I129 Hypertensive chronic kidney disease with stage 1 through stage 4 chronic kidney disease, or unspecified chronic kidney disease: Secondary | ICD-10-CM | POA: Diagnosis not present

## 2021-03-18 DIAGNOSIS — E162 Hypoglycemia, unspecified: Secondary | ICD-10-CM | POA: Diagnosis not present

## 2021-03-18 DIAGNOSIS — I25110 Atherosclerotic heart disease of native coronary artery with unstable angina pectoris: Secondary | ICD-10-CM | POA: Diagnosis not present

## 2021-03-18 DIAGNOSIS — Z8673 Personal history of transient ischemic attack (TIA), and cerebral infarction without residual deficits: Secondary | ICD-10-CM | POA: Diagnosis not present

## 2021-03-18 DIAGNOSIS — Z951 Presence of aortocoronary bypass graft: Secondary | ICD-10-CM | POA: Diagnosis not present

## 2021-03-18 DIAGNOSIS — E785 Hyperlipidemia, unspecified: Secondary | ICD-10-CM | POA: Diagnosis not present

## 2021-03-18 DIAGNOSIS — I714 Abdominal aortic aneurysm, without rupture, unspecified: Secondary | ICD-10-CM | POA: Diagnosis present

## 2021-03-18 DIAGNOSIS — G9341 Metabolic encephalopathy: Secondary | ICD-10-CM | POA: Diagnosis not present

## 2021-03-18 DIAGNOSIS — A0839 Other viral enteritis: Secondary | ICD-10-CM | POA: Diagnosis not present

## 2021-03-18 DIAGNOSIS — J90 Pleural effusion, not elsewhere classified: Secondary | ICD-10-CM | POA: Diagnosis not present

## 2021-03-18 DIAGNOSIS — R1310 Dysphagia, unspecified: Secondary | ICD-10-CM | POA: Diagnosis not present

## 2021-03-18 DIAGNOSIS — E860 Dehydration: Secondary | ICD-10-CM | POA: Diagnosis present

## 2021-03-18 DIAGNOSIS — Z28311 Partially vaccinated for covid-19: Secondary | ICD-10-CM

## 2021-03-18 DIAGNOSIS — Z885 Allergy status to narcotic agent status: Secondary | ICD-10-CM

## 2021-03-18 DIAGNOSIS — E875 Hyperkalemia: Secondary | ICD-10-CM | POA: Diagnosis not present

## 2021-03-18 DIAGNOSIS — Z8249 Family history of ischemic heart disease and other diseases of the circulatory system: Secondary | ICD-10-CM

## 2021-03-18 DIAGNOSIS — E1122 Type 2 diabetes mellitus with diabetic chronic kidney disease: Secondary | ICD-10-CM | POA: Diagnosis not present

## 2021-03-18 DIAGNOSIS — M1990 Unspecified osteoarthritis, unspecified site: Secondary | ICD-10-CM | POA: Diagnosis present

## 2021-03-18 DIAGNOSIS — E161 Other hypoglycemia: Secondary | ICD-10-CM | POA: Diagnosis not present

## 2021-03-18 DIAGNOSIS — E1151 Type 2 diabetes mellitus with diabetic peripheral angiopathy without gangrene: Secondary | ICD-10-CM | POA: Diagnosis present

## 2021-03-18 DIAGNOSIS — I2119 ST elevation (STEMI) myocardial infarction involving other coronary artery of inferior wall: Secondary | ICD-10-CM | POA: Diagnosis not present

## 2021-03-18 DIAGNOSIS — E1165 Type 2 diabetes mellitus with hyperglycemia: Secondary | ICD-10-CM | POA: Diagnosis not present

## 2021-03-18 DIAGNOSIS — R7989 Other specified abnormal findings of blood chemistry: Secondary | ICD-10-CM | POA: Diagnosis not present

## 2021-03-18 DIAGNOSIS — Z833 Family history of diabetes mellitus: Secondary | ICD-10-CM | POA: Diagnosis not present

## 2021-03-18 DIAGNOSIS — R059 Cough, unspecified: Secondary | ICD-10-CM | POA: Diagnosis not present

## 2021-03-18 DIAGNOSIS — J9600 Acute respiratory failure, unspecified whether with hypoxia or hypercapnia: Secondary | ICD-10-CM | POA: Diagnosis not present

## 2021-03-18 DIAGNOSIS — R404 Transient alteration of awareness: Secondary | ICD-10-CM | POA: Diagnosis not present

## 2021-03-18 DIAGNOSIS — Z881 Allergy status to other antibiotic agents status: Secondary | ICD-10-CM

## 2021-03-18 DIAGNOSIS — R4701 Aphasia: Secondary | ICD-10-CM | POA: Diagnosis not present

## 2021-03-18 DIAGNOSIS — I252 Old myocardial infarction: Secondary | ICD-10-CM

## 2021-03-18 DIAGNOSIS — I10 Essential (primary) hypertension: Secondary | ICD-10-CM | POA: Diagnosis not present

## 2021-03-18 DIAGNOSIS — Z87891 Personal history of nicotine dependence: Secondary | ICD-10-CM

## 2021-03-18 DIAGNOSIS — R4020 Unspecified coma: Secondary | ICD-10-CM | POA: Diagnosis not present

## 2021-03-18 DIAGNOSIS — N183 Chronic kidney disease, stage 3 unspecified: Secondary | ICD-10-CM | POA: Diagnosis not present

## 2021-03-18 DIAGNOSIS — N289 Disorder of kidney and ureter, unspecified: Secondary | ICD-10-CM | POA: Diagnosis not present

## 2021-03-18 DIAGNOSIS — K219 Gastro-esophageal reflux disease without esophagitis: Secondary | ICD-10-CM | POA: Diagnosis not present

## 2021-03-18 DIAGNOSIS — R739 Hyperglycemia, unspecified: Secondary | ICD-10-CM | POA: Diagnosis not present

## 2021-03-18 DIAGNOSIS — Z83438 Family history of other disorder of lipoprotein metabolism and other lipidemia: Secondary | ICD-10-CM | POA: Diagnosis not present

## 2021-03-18 DIAGNOSIS — Z79899 Other long term (current) drug therapy: Secondary | ICD-10-CM

## 2021-03-18 DIAGNOSIS — N1832 Chronic kidney disease, stage 3b: Secondary | ICD-10-CM | POA: Diagnosis present

## 2021-03-18 DIAGNOSIS — I63311 Cerebral infarction due to thrombosis of right middle cerebral artery: Secondary | ICD-10-CM | POA: Diagnosis not present

## 2021-03-18 DIAGNOSIS — Z880 Allergy status to penicillin: Secondary | ICD-10-CM

## 2021-03-18 DIAGNOSIS — K5900 Constipation, unspecified: Secondary | ICD-10-CM | POA: Diagnosis present

## 2021-03-18 DIAGNOSIS — Z7982 Long term (current) use of aspirin: Secondary | ICD-10-CM

## 2021-03-18 DIAGNOSIS — Z7902 Long term (current) use of antithrombotics/antiplatelets: Secondary | ICD-10-CM

## 2021-03-18 DIAGNOSIS — N179 Acute kidney failure, unspecified: Secondary | ICD-10-CM | POA: Diagnosis present

## 2021-03-18 DIAGNOSIS — G8194 Hemiplegia, unspecified affecting left nondominant side: Secondary | ICD-10-CM | POA: Diagnosis not present

## 2021-03-18 DIAGNOSIS — I70219 Atherosclerosis of native arteries of extremities with intermittent claudication, unspecified extremity: Secondary | ICD-10-CM | POA: Diagnosis not present

## 2021-03-18 LAB — LIPASE, BLOOD: Lipase: 47 U/L (ref 11–51)

## 2021-03-18 LAB — COMPREHENSIVE METABOLIC PANEL
ALT: 12 U/L (ref 0–44)
AST: 20 U/L (ref 15–41)
Albumin: 3.4 g/dL — ABNORMAL LOW (ref 3.5–5.0)
Alkaline Phosphatase: 52 U/L (ref 38–126)
Anion gap: 9 (ref 5–15)
BUN: 45 mg/dL — ABNORMAL HIGH (ref 8–23)
CO2: 19 mmol/L — ABNORMAL LOW (ref 22–32)
Calcium: 8.7 mg/dL — ABNORMAL LOW (ref 8.9–10.3)
Chloride: 107 mmol/L (ref 98–111)
Creatinine, Ser: 4.84 mg/dL — ABNORMAL HIGH (ref 0.44–1.00)
GFR, Estimated: 9 mL/min — ABNORMAL LOW (ref >60)
Glucose, Bld: 107 mg/dL — ABNORMAL HIGH (ref 70–99)
Potassium: 4.3 mmol/L (ref 3.5–5.1)
Sodium: 135 mmol/L (ref 135–145)
Total Bilirubin: 0.5 mg/dL (ref 0.3–1.2)
Total Protein: 7.3 g/dL (ref 6.5–8.1)

## 2021-03-18 LAB — LACTATE DEHYDROGENASE: LDH: 222 U/L — ABNORMAL HIGH (ref 98–192)

## 2021-03-18 LAB — RESP PANEL BY RT-PCR (FLU A&B, COVID) ARPGX2
Influenza A by PCR: NEGATIVE
Influenza B by PCR: NEGATIVE
SARS Coronavirus 2 by RT PCR: POSITIVE — AB

## 2021-03-18 LAB — TROPONIN I (HIGH SENSITIVITY): Troponin I (High Sensitivity): 21 ng/L — ABNORMAL HIGH (ref <18)

## 2021-03-18 LAB — CBC WITH DIFFERENTIAL/PLATELET
Abs Immature Granulocytes: 0.02 10*3/uL (ref 0.00–0.07)
Basophils Absolute: 0 10*3/uL (ref 0.0–0.1)
Basophils Relative: 1 %
Eosinophils Absolute: 0 10*3/uL (ref 0.0–0.5)
Eosinophils Relative: 0 %
HCT: 32.9 % — ABNORMAL LOW (ref 36.0–46.0)
Hemoglobin: 10.4 g/dL — ABNORMAL LOW (ref 12.0–15.0)
Immature Granulocytes: 0 %
Lymphocytes Relative: 19 %
Lymphs Abs: 1 10*3/uL (ref 0.7–4.0)
MCH: 28.5 pg (ref 26.0–34.0)
MCHC: 31.6 g/dL (ref 30.0–36.0)
MCV: 90.1 fL (ref 80.0–100.0)
Monocytes Absolute: 0.5 10*3/uL (ref 0.1–1.0)
Monocytes Relative: 9 %
Neutro Abs: 3.7 10*3/uL (ref 1.7–7.7)
Neutrophils Relative %: 71 %
Platelets: 151 10*3/uL (ref 150–400)
RBC: 3.65 MIL/uL — ABNORMAL LOW (ref 3.87–5.11)
RDW: 15.8 % — ABNORMAL HIGH (ref 11.5–15.5)
WBC: 5.2 10*3/uL (ref 4.0–10.5)
nRBC: 0 % (ref 0.0–0.2)

## 2021-03-18 LAB — LACTIC ACID, PLASMA
Lactic Acid, Venous: 1 mmol/L (ref 0.5–1.9)
Lactic Acid, Venous: 2.1 mmol/L (ref 0.5–1.9)

## 2021-03-18 LAB — CBG MONITORING, ED
Glucose-Capillary: 148 mg/dL — ABNORMAL HIGH (ref 70–99)
Glucose-Capillary: 61 mg/dL — ABNORMAL LOW (ref 70–99)
Glucose-Capillary: 91 mg/dL (ref 70–99)

## 2021-03-18 LAB — C-REACTIVE PROTEIN: CRP: 1.2 mg/dL — ABNORMAL HIGH (ref <1.0)

## 2021-03-18 LAB — PROCALCITONIN: Procalcitonin: <0.1 ng/mL

## 2021-03-18 LAB — FIBRINOGEN: Fibrinogen: 470 mg/dL (ref 210–475)

## 2021-03-18 LAB — FERRITIN: Ferritin: 485 ng/mL — ABNORMAL HIGH (ref 11–307)

## 2021-03-18 LAB — D-DIMER, QUANTITATIVE: D-Dimer, Quant: 5.84 ug/mL-FEU — ABNORMAL HIGH (ref 0.00–0.50)

## 2021-03-18 LAB — TSH: TSH: 0.745 u[IU]/mL (ref 0.350–4.500)

## 2021-03-18 LAB — AMMONIA: Ammonia: 15 umol/L (ref 9–35)

## 2021-03-18 MED ORDER — ONDANSETRON HCL 4 MG/2ML IJ SOLN
4.0000 mg | Freq: Four times a day (QID) | INTRAMUSCULAR | Status: DC | PRN
  Administered 2021-03-19: 4 mg via INTRAVENOUS
  Filled 2021-03-18: qty 2

## 2021-03-18 MED ORDER — PANTOPRAZOLE SODIUM 40 MG PO TBEC
40.0000 mg | DELAYED_RELEASE_TABLET | Freq: Every day | ORAL | Status: DC
  Administered 2021-03-18 – 2021-03-22 (×5): 40 mg via ORAL
  Filled 2021-03-18 (×5): qty 1

## 2021-03-18 MED ORDER — DEXTROSE-NACL 5-0.9 % IV SOLN: INTRAVENOUS | Status: AC

## 2021-03-18 MED ORDER — ACETAMINOPHEN 325 MG PO TABS
650.0000 mg | ORAL_TABLET | Freq: Every evening | ORAL | Status: DC | PRN
  Administered 2021-03-19: 650 mg via ORAL
  Filled 2021-03-18 (×2): qty 2

## 2021-03-18 MED ORDER — FAMOTIDINE 20 MG PO TABS
10.0000 mg | ORAL_TABLET | Freq: Two times a day (BID) | ORAL | Status: DC
  Administered 2021-03-18 – 2021-03-22 (×9): 10 mg via ORAL
  Filled 2021-03-18 (×9): qty 1

## 2021-03-18 MED ORDER — ENSURE ENLIVE PO LIQD
237.0000 mL | Freq: Two times a day (BID) | ORAL | Status: DC
  Administered 2021-03-18 – 2021-03-19 (×2): 237 mL via ORAL
  Filled 2021-03-18: qty 237

## 2021-03-18 MED ORDER — HEPARIN SODIUM (PORCINE) 5000 UNIT/ML IJ SOLN
5000.0000 [IU] | Freq: Two times a day (BID) | INTRAMUSCULAR | Status: DC
  Administered 2021-03-18 – 2021-03-21 (×7): 5000 [IU] via SUBCUTANEOUS
  Filled 2021-03-18 (×7): qty 1

## 2021-03-18 MED ORDER — BUSPIRONE HCL 5 MG PO TABS
5.0000 mg | ORAL_TABLET | Freq: Two times a day (BID) | ORAL | Status: DC
  Administered 2021-03-18 – 2021-03-22 (×8): 5 mg via ORAL
  Filled 2021-03-18 (×8): qty 1

## 2021-03-18 MED ORDER — ASPIRIN EC 81 MG PO TBEC
81.0000 mg | DELAYED_RELEASE_TABLET | Freq: Every day | ORAL | Status: DC
  Administered 2021-03-18 – 2021-03-22 (×5): 81 mg via ORAL
  Filled 2021-03-18 (×5): qty 1

## 2021-03-18 MED ORDER — CARVEDILOL 3.125 MG PO TABS
3.1250 mg | ORAL_TABLET | Freq: Two times a day (BID) | ORAL | Status: DC
  Administered 2021-03-18 – 2021-03-19 (×2): 3.125 mg via ORAL
  Filled 2021-03-18 (×2): qty 1

## 2021-03-18 MED ORDER — VARENICLINE TARTRATE 0.5 MG PO TABS
0.5000 mg | ORAL_TABLET | Freq: Two times a day (BID) | ORAL | Status: DC
  Administered 2021-03-18 – 2021-03-19 (×2): 0.5 mg via ORAL
  Filled 2021-03-18 (×5): qty 1

## 2021-03-18 MED ORDER — ISOSORBIDE MONONITRATE ER 30 MG PO TB24
30.0000 mg | ORAL_TABLET | Freq: Every day | ORAL | Status: DC
  Administered 2021-03-18 – 2021-03-20 (×3): 30 mg via ORAL
  Filled 2021-03-18 (×3): qty 1

## 2021-03-18 MED ORDER — ONDANSETRON HCL 4 MG PO TABS: 4.0000 mg | ORAL_TABLET | Freq: Four times a day (QID) | ORAL | Status: DC | PRN

## 2021-03-18 MED ORDER — SODIUM CHLORIDE 0.9 % IV SOLN
100.0000 mg | Freq: Every day | INTRAVENOUS | Status: AC
  Administered 2021-03-19 – 2021-03-21 (×3): 100 mg via INTRAVENOUS
  Filled 2021-03-18 (×3): qty 20

## 2021-03-18 MED ORDER — DEXTROSE 50 % IV SOLN
1.0000 | Freq: Once | INTRAVENOUS | Status: AC
  Administered 2021-03-18: 50 mL via INTRAVENOUS
  Filled 2021-03-18: qty 50

## 2021-03-18 MED ORDER — ALBUTEROL SULFATE HFA 108 (90 BASE) MCG/ACT IN AERS
2.0000 | INHALATION_SPRAY | RESPIRATORY_TRACT | Status: DC | PRN
  Filled 2021-03-18: qty 6.7

## 2021-03-18 MED ORDER — ALBUTEROL SULFATE HFA 108 (90 BASE) MCG/ACT IN AERS
2.0000 | INHALATION_SPRAY | Freq: Four times a day (QID) | RESPIRATORY_TRACT | Status: DC
  Administered 2021-03-18 – 2021-03-22 (×12): 2 via RESPIRATORY_TRACT
  Filled 2021-03-18: qty 6.7

## 2021-03-18 MED ORDER — AMLODIPINE BESYLATE 10 MG PO TABS
10.0000 mg | ORAL_TABLET | Freq: Every day | ORAL | Status: DC
  Administered 2021-03-18 – 2021-03-22 (×5): 10 mg via ORAL
  Filled 2021-03-18 (×2): qty 1
  Filled 2021-03-18: qty 2
  Filled 2021-03-18 (×2): qty 1

## 2021-03-18 MED ORDER — ROSUVASTATIN CALCIUM 5 MG PO TABS
10.0000 mg | ORAL_TABLET | Freq: Every day | ORAL | Status: DC
  Administered 2021-03-18 – 2021-03-21 (×4): 10 mg via ORAL
  Filled 2021-03-18 (×4): qty 2

## 2021-03-18 MED ORDER — INSULIN ASPART 100 UNIT/ML IJ SOLN
0.0000 [IU] | Freq: Three times a day (TID) | INTRAMUSCULAR | Status: DC
  Administered 2021-03-19: 1 [IU] via SUBCUTANEOUS

## 2021-03-18 MED ORDER — CLOPIDOGREL BISULFATE 75 MG PO TABS
75.0000 mg | ORAL_TABLET | Freq: Every day | ORAL | Status: DC
  Administered 2021-03-18 – 2021-03-22 (×5): 75 mg via ORAL
  Filled 2021-03-18 (×5): qty 1

## 2021-03-18 MED ORDER — GUAIFENESIN-DM 100-10 MG/5ML PO SYRP
10.0000 mL | ORAL_SOLUTION | ORAL | Status: DC | PRN
  Administered 2021-03-18: 10 mL via ORAL
  Filled 2021-03-18: qty 10

## 2021-03-18 MED ORDER — HYDRALAZINE HCL 25 MG PO TABS
25.0000 mg | ORAL_TABLET | Freq: Four times a day (QID) | ORAL | Status: DC | PRN
  Administered 2021-03-19 (×2): 25 mg via ORAL
  Filled 2021-03-18 (×3): qty 1

## 2021-03-18 MED ORDER — SODIUM CHLORIDE 0.9 % IV SOLN
200.0000 mg | Freq: Once | INTRAVENOUS | Status: DC
  Filled 2021-03-18: qty 40

## 2021-03-18 NOTE — ED Provider Notes (Signed)
[facility] EMERGENCY DEPARTMENT Provider Note   CSN: [id number] Arrival date & time: 03/18/21  1111     History No chief complaint on file.   [name] is a 80 y.o. female.  The history is provided by the patient and medical records. No language interpreter was used.  Hypoglycemia Initial blood sugar:  50's Blood sugar after intervention:  100 Severity:  Severe Onset quality:  Gradual Duration:  1 day Timing:  Intermittent Progression:  Waxing and waning Chronicity:  New Diabetic status:  Controlled with oral medications Relieved by:  Nothing Associated symptoms: altered mental status and decreased responsiveness   Associated symptoms: no seizures, no shortness of breath, no speech difficulty, no vomiting and no weakness       Past Medical History:  Diagnosis Date   AAA (abdominal aortic aneurysm)    Arthritis    osteoarthritis   Constipation    DM with vascular complications    Type 2, on Glimiperide   GERD (gastroesophageal reflux disease)    Glaucoma    Hyperlipidemia    Hypertension    Kidney stones    Myocardial infarction [facility])    Peripheral vascular disease ([place])    Pneumonia    Stroke ([place]) 01/28/2015   with left side weakness    TIA (transient ischemic attack)    Urinary incontinence     Patient Active Problem List   Diagnosis Date Noted   Depression, major, single episode, moderate ([place]) 03/09/2021   AKI (acute kidney injury) ([place]) 03/03/2021   Acute blood loss anemia 01/28/2021   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   NSTEMI (non-ST elevated myocardial infarction) ([place]) 01/10/2021   CAD (coronary artery disease) 01/10/2021   CVA (cerebral vascular accident) ([place]) 10/11/2019   Hypertensive urgency 04/07/2019   CKD (chronic kidney disease) stage 3, GFR 30-59 ml/min ([place]) 04/07/2019   Chest pain 04/28/2017   History of ST elevation myocardial infarction (STEMI) 04/28/2017   Grief reaction 04/28/2017   Osteoarthritis of left hip  05/18/2015   Status post total replacement of left hip 05/18/2015   Coronary artery disease involving native coronary artery of native heart with angina pectoris ([place]) 04/16/2015   History of stroke 04/16/2015   Type 2 diabetes mellitus with circulatory disorder ([place]) 04/16/2015   Cerebrovascular 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 artery ([place]) 04/16/2015   Acute CVA (cerebrovascular accident) ([place]) 02/19/2015   Transient ischemic attack (TIA) 02/18/2015   Stroke ([place])    Cerebral infarction due to thrombosis of right middle cerebral artery (HCC)    Acute myocardial infarction of inferolateral wall ([place]) 04/12/2014   Numbness 03/18/2014   TIA (transient ischemic attack) 03/18/2014   DM (diabetes mellitus), secondary, with peripheral vascular complications ([place]) 78/29/5621   Hyperlipidemia    Hypertension    PVD (peripheral vascular disease) ([place]) 03/05/2014   Transient cerebral ischemia 08/01/2012   Atherosclerosis of native arteries of extremity with intermittent claudication ([place]) 10/19/2011   Unspecified disorders of arteries and arterioles 10/19/2011   PAD (peripheral artery disease) ([place]) 04/13/2011    Past Surgical History:  Procedure Laterality Date   ABDOMINAL AORTAGRAM N/A 03/13/2014   Procedure: ABDOMINAL Maxcine Ham;  Surgeon: Elam Dutch, MD;  Location: [facility] C[facility] LAB;  Service: Cardiovascular;  Laterality: N/A;   ABDOMINAL AORTIC ANEURYSM REPAIR  ?2012   ABDOMINAL HYSTERECTOMY     partial   BACK SURGERY     CARDIAC CATHETERIZATION     CORONARY ANGIOPLASTY  03/30/2015  CORONARY ARTERY BYPASS GRAFT N/A 01/17/2021   Procedure: CORONARY ARTERY BYPASS GRAFTING (CABG) TIMES 3 , ON PUMP, USING LEFT INTERNAL MAMMARY ARTERY AND ENDOSCOPICALLY HARVESTED RIGHT GREATER SAPHENOUS VEIN;  Surgeon: Lajuana Matte, MD;  Location: [facility];  Service: Open Heart Surgery;  Laterality: N/A;   ENDOVEIN HARVEST OF GREATER SAPHENOUS VEIN Right 01/17/2021    Procedure: ENDOVEIN HARVEST OF GREATER SAPHENOUS VEIN;  Surgeon: Lajuana Matte, MD;  Location: [facility];  Service: Open Heart Surgery;  Laterality: Right;   EYE SURGERY Right    laser surgery for blood behind eye, loss of sight   FEMORAL-POPLITEAL BYPASS GRAFT Left 07/06/2014   Procedure: BYPASS GRAFT FEMORAL-POPLITEAL ARTERY;  Surgeon: Elam Dutch, MD;  Location[facility] OR;  Service: Vascular;  Laterality: Left;   HERNIA REPAIR     JOINT REPLACEMENT     LEFT HEART CATH AND CORONARY ANGIOGRAPHY N/A 01/11/2021   Procedure: LEFT HEART CATH AND CORONARY ANGIOGRAPHY;  Surgeon: [name], MD;  Location: [facility] CV LAB;  Service: Cardiovascular;  Laterality: N/A;   LEFT HEART CATHETERIZATION WITH CORONARY ANGIOGRAM N/A 04/12/2014   Procedure: LEFT HEART CATHETERIZATION WITH CORONARY ANGIOGRAM;  Surgeon: Clent Demark, MD;  Location: [facility] CATH LAB;  Service: Cardiovascular;  Laterality: N/A;   PERCUTANEOUS CORONARY STENT INTERVENTION (PCI-S)  04/12/2014   Procedure: PERCUTANEOUS CORONARY STENT INTERVENTION (PCI-S);  Surgeon: Clent Demark, MD;  Location: [facility] CATH LAB;  Service: Cardiovascular;;  prox and mid RCA   PERIPHERAL VASCULAR INTERVENTION  06/25/2019   Procedure: PERIPHERAL VASCULAR INTERVENTION;  Surgeon: Elam Dutch, MD;  Location: [facility] CV LAB;  Service: Cardiovascular;;  Lt Renal and Rt Renal   RENAL ANGIOGRAPHY Bilateral 06/25/2019   Procedure: RENAL ANGIOGRAPHY;  Surgeon: Elam Dutch, MD;  Location: [facility] CV LAB;  Service: Cardiovascular;  Laterality: Bilateral;   TEE WITHOUT CARDIOVERSION N/A 01/17/2021   Procedure: TRANSESOPHAGEAL ECHOCARDIOGRAM (TEE);  Surgeon: Lajuana Matte, MD;  Location: [facility];  Service: Open Heart Surgery;  Laterality: N/A;   TOTAL HIP ARTHROPLASTY Left 05/18/2015   Procedure: LEFT TOTAL HIP ARTHROPLASTY ANTERIOR APPROACH;  Surgeon: Mcarthur Rossetti, MD;  Location: [facility];  Service: Orthopedics;  Laterality: Left;    TOTAL KNEE ARTHROPLASTY Left ~ 2003   TUBAL LIGATION     uterine tumor     VENTRAL HERNIA REPAIR       OB History   No obstetric history on file.     Family History  Problem Relation Age of Onset   Diabetes Mother    Stroke Mother    Hypertension Father    Stroke Father    Hyperlipidemia Sister    Hypertension Sister    Aneurysm Sister    Hyperlipidemia Brother    Hypertension Brother     Social History   Tobacco Use   Smoking status: Former    Packs/day: 0.50    Years: 40.00    Pack years: 20.00    Types: Cigarettes   Smokeless tobacco: Never  Vaping Use   Vaping Use: Never used  Substance Use Topics   Alcohol use: No    Alcohol/week: 0.0 standard drinks   Drug use: No    Home Medications Prior to Admission medications   Medication Sig Start Date End Date Taking? Authorizing Provider  acetaminophen (TYLENOL) 650 MG CR tablet Take 650 mg by mouth at bedtime as needed for pain.     [provider]  albuterol (PROVENTIL HFA;VENTOLIN HFA) 108 (90  Base) MCG/ACT inhaler Inhale 2 puffs into the lungs every 4 (four) hours as needed for wheezing or shortness of breath (or coughing). 01/14/55   Delora Fuel, MD  amLODipine (NORVASC) 10 MG tablet Take 10 mg by mouth daily. 09/01/19   [provider]  aspirin EC 81 MG EC tablet Take 1 tablet (81 mg total) by mouth daily. Swallow whole. 01/28/21   Antony Odea, PA-C  bisacodyl (DULCOLAX) 10 MG suppository Place 10 mg rectally as needed for moderate constipation.    [provider]  busPIRone (BUSPAR) 5 MG tablet Take 1 tablet (5 mg total) by mouth 2 (two) times daily. 10/15/19   Georgette Shell, MD  clopidogrel (PLAVIX) 75 MG tablet Take 1 tablet (75 mg total) by mouth daily. 01/28/21   Antony Odea, PA-C  diclofenac Sodium (VOLTAREN) 1 % GEL Apply topically. Apply to left anterior chest twice a day for pain r/t costochondritis    [provider]  feeding supplement, GLUCERNA  SHAKE, (GLUCERNA SHAKE) LIQD Take 237 mLs by mouth 2 (two) times daily between meals.    [provider]  insulin aspart (NOVOLOG) 100 UNIT/ML injection Inject 5 Units into the skin. For CBG equal or greater than 200    [provider]  isosorbide mononitrate (IMDUR) 30 MG 24 hr tablet Take 1 tablet (30 mg total) by mouth daily. 01/28/21   Antony Odea, PA-C  magnesium hydroxide (MILK OF MAGNESIA) 400 MG/5ML suspension If no BM in 3 days, give 30 cc Milk of Magnesium p.o. x 1 dose in 24 hours as needed    [provider]  Menthol, Topical Analgesic, (BIOFREEZE EX) Apply topically. Apply topically to affected areas three times daily as needed for pain.    [provider]  metoprolol tartrate (LOPRESSOR) 25 MG tablet Take 1 tablet (25 mg total) by mouth 2 (two) times daily. 03/02/21   Medina-Vargas, Monina C, NP  NON FORMULARY Diet: Regular diet with thin liquids    [provider]  pantoprazole (PROTONIX) 40 MG tablet Take 1 tablet (40 mg total) by mouth daily. 01/28/21   Antony Odea, PA-C  rosuvastatin (CRESTOR) 10 MG tablet Take 1 tablet (10 mg total) by mouth at bedtime. 01/27/21   Antony Odea, PA-C  sertraline (ZOLOFT) 25 MG tablet Take 25 mg by mouth daily.    [provider]  sertraline (ZOLOFT) 50 MG tablet Take 50 mg by mouth daily.    [provider]  Sodium Phosphates (RA SALINE ENEMA RE) Place 1 Dose rectally as needed.    [provider]  vitamin B-12 (CYANOCOBALAMIN) 250 MCG tablet Take 1 tablet (250 mcg total) by mouth daily. 01/28/21   Antony Odea, PA-C    Allergies    Keflex [cephalexin], Lipitor [atorvastatin], Codeine, Hydrochlorothiazide, Hydrocodone-acetaminophen, Metformin hcl, Tramadol hcl, Azor [amlodipine-olmesartan], Lisinopril, and Penicillins  Review of Systems   Review of Systems  Constitutional:  Positive for chills, decreased responsiveness and fatigue.  HENT:  Negative  for congestion.   Eyes:  Negative for visual disturbance.  Respiratory:  Positive for cough. Negative for chest tightness, shortness of breath and wheezing.   Cardiovascular:  Negative for chest pain and leg swelling.  Gastrointestinal:  Positive for diarrhea. Negative for abdominal pain, constipation and vomiting.  Genitourinary:  Positive for dysuria and frequency.  Musculoskeletal:  Negative for back pain and neck pain.  Skin:  Negative for rash and wound.  Neurological:  Positive for light-headedness. Negative for  seizures, speech difficulty, weakness, numbness and headaches.  Psychiatric/Behavioral:  Negative for agitation.   All other systems reviewed and are negative.  Physical Exam Updated Vital Signs BP (!) 174/77   Pulse 72   Temp 98.4 F (36.9 C) (Oral)   Resp 17   Wt 71.2 kg   SpO2 100%   BMI 24.59 kg/m   Physical Exam Vitals and nursing note reviewed.  Constitutional:      General: She is not in acute distress.    Appearance: She is well-developed. She is not ill-appearing, toxic-appearing or diaphoretic.  HENT:     Head: Normocephalic and atraumatic.     Nose: No congestion or rhinorrhea.     Mouth/Throat:     Mouth: Mucous membranes are moist.     Pharynx: No oropharyngeal exudate or posterior oropharyngeal erythema.  Eyes:     Extraocular Movements: Extraocular movements intact.     Conjunctiva/sclera: Conjunctivae normal.     Pupils: Pupils are equal, round, and reactive to light.  Cardiovascular:     Rate and Rhythm: Normal rate and regular rhythm.     Heart sounds: No murmur heard. Pulmonary:     Effort: Pulmonary effort is normal. No respiratory distress.     Breath sounds: Normal breath sounds. No wheezing, rhonchi or rales.  Chest:     Chest wall: No tenderness.  Abdominal:     General: Abdomen is flat.     Palpations: Abdomen is soft.     Tenderness: There is no abdominal tenderness. There is no right CVA tenderness, left CVA tenderness,  guarding or rebound.  Musculoskeletal:        General: No tenderness.     Cervical back: Neck supple. No tenderness.  Skin:    General: Skin is warm and dry.     Capillary Refill: Capillary refill takes less than 2 seconds.     Findings: No erythema.  Neurological:     General: No focal deficit present.     Mental Status: She is alert.     Sensory: No sensory deficit.     Motor: No weakness.  Psychiatric:        Mood and Affect: Mood normal.    ED Results / Procedures / Treatments   Labs (all labs ordered are listed, but only abnormal results are displayed) Labs Reviewed  RESP PANEL BY RT-PCR (FLU A&B, COVID) ARPGX2 - Abnormal; Notable for the following components:      Result Value   SARS Coronavirus 2 by RT PCR POSITIVE (*)    All other components within normal limits  CBC WITH DIFFERENTIAL/PLATELET - Abnormal; Notable for the following components:   RBC 3.65 (*)    Hemoglobin 10.4 (*)    HCT 32.9 (*)    RDW 15.8 (*)    All other components within normal limits  COMPREHENSIVE METABOLIC PANEL - Abnormal; Notable for the following components:   CO2 19 (*)    Glucose, Bld 107 (*)    BUN 45 (*)    Creatinine, Ser 4.84 (*)    Calcium 8.7 (*)    Albumin 3.4 (*)    GFR, Estimated 9 (*)    All other components within normal limits  CBG MONITORING, ED - Abnormal; Notable for the following components:   Glucose-Capillary 61 (*)    All other components within normal limits  CBG MONITORING, ED - Abnormal; Notable for the following components:   Glucose-Capillary 148 (*)    All other components within normal  limits  URINE CULTURE  LIPASE, BLOOD  LACTIC ACID, PLASMA  AMMONIA  TSH  LACTIC ACID, PLASMA  URINALYSIS, ROUTINE W REFLEX MICROSCOPIC    EKG EKG Interpretation  Date/Time:  Friday March 18 2021 11:43:29 EDT Ventricular Rate:  74 PR Interval:  149 QRS Duration: 91 QT Interval:  414 QTC Calculation: 460 R Axis:   78 Text Interpretation: Sinus rhythm Consider  left ventricular hypertrophy Abnormal T, consider ischemia, lateral leads Artifact in lead(s) I II III aVR aVL aVF When compared to prior, more artifact. No STEMI Confirmed by Antony Blackbird [phone]) on 03/18/2021 11:57:25 AM  Radiology No results found.  Procedures Procedures   CRITICAL CARE Performed by: Gwenyth Allegra Aadhya Bustamante Total critical care time: 35 minutes Critical care time was exclusive of separately billable procedures and treating other patients. Critical care was necessary to treat or prevent imminent or life-threatening deterioration. Critical care was time spent personally by me on the following activities: development of treatment plan with patient and/or surrogate as well as nursing, discussions with consultants, evaluation of patient's response to treatment, examination of patient, obtaining history from patient or surrogate, ordering and performing treatments and interventions, ordering and review of laboratory studies, ordering and review of radiographic studies, pulse oximetry and re-evaluation of patient's condition.   Medications Ordered in ED Medications  dextrose 50 % solution 50 mL (50 mLs Intravenous Given 03/18/21 1240)    ED Course  I have reviewed the triage vital signs and the nursing notes.  Pertinent labs & imaging results that were available during my care of the patient were reviewed by me and considered in my medical decision making (see chart for details).    MDM Rules/Calculators/A&P                           Aaleah Hirsch is a 80 y.o. female with a past medical history significant for CAD status post CABG , prior stroke/TIAs, AAA, diabetes, kidney stones, hypertension, peripheral vascular disease, GERD, and CKD who presents with altered mental status and hyperglycemia.  According to patient, she has had some diarrhea, congestion, productive cough, and fatigue for the last 2 days and then this morning so she went to the bathroom and then got back  in bed and the next thing he remembers she was in the ambulance.  According to EMS, patient was altered this morning and was found to have glucose in the 50s.  After they gave her D50, patient's glucose improved as did her mental status.  She is now complaining of some improving "swimmy headedness" or dizziness and some left leg weakness which is slightly worse than her baseline weakness from the prior stroke.  She notes that the symptoms are improving now that she has had the glucose.  She was last normal before going to bed last night around 830 for EMS.  Currently, patient is denying any headache but does report the fatigue is still present.  She reports the swimmy headedness has resolved.  She still thinks her left leg is slightly more weak and than baseline but otherwise denies current pains.  On exam, lungs have some mild coarseness but no significant wheezing or rales initially.  Chest and abdomen are nontender.  Extremities nontender.  Left leg is slightly weak with leg raise compared to the right but she reports this is slightly worse than baseline.  Otherwise normal sensation throughout.  Normal strength in the arms.  Symmetric smile.  Clear speech.  She denies vision changes and had normal extraocular movements.  Pupils symmetric.  Initial glucose on arrival was over 100.  Suspect the D50 with EMS has helped.  Will do work-up to look for occult infection given the chills, productive cough, diarrhea, and her fatigue.  Unclear if this is what led to her hypoglycemic episode today which likely contribute to the altered mental status which improved with sugar.  Anticipate trending of her glucose and her work-up.  If we find infection with her hypoglycemia and altered mental status, patient may need admission.  Anticipate reassessment after work-up.  We will get head CT given the reported left leg weakness from baseline.  With improving symptoms, do not suspect she is a code stroke at this time and  is more likely recrudescence in the setting of hyperglycemia and possible occult infection.  Will get head CT.  12:24 PM After returning from CT scan, patient's glucose is now 61 despite initial glucose with EMS and eating and drinking juice and food.  We will order more D50 and now that she is presenting with recurrent hypoglycemia despite IV glucose, I anticipate she will need admission.  2:19 PM COVID test has returned positive.  Suspect this is playing a role in her symptoms.  Her creatinine returned at 4.81 which is much higher than previously.  Given the persistent and recurrent hypoglycemia, AKI, and COVID positivity, will call for admission.  CT head and chest x-ray were reassuring although the initial read did not crossover in the system but worsening in the imaging software.  Patient will be admitted for further management.   Final Clinical Impression(s) / ED Diagnoses Final diagnoses:  Hypoglycemia  COVID  AKI (acute kidney injury) ([place])      Clinical Impression: 1. Hypoglycemia   2. COVID   3. AKI (acute kidney injury) ([place])     Disposition: Admit  This note was prepared with assistance of Dragon voice recognition software. Occasional wrong-word or sound-a-like substitutions may have occurred due to the inherent limitations of voice recognition software.     Lerone Onder, Gwenyth Allegra, MD 03/18/21 1421

## 2021-03-18 NOTE — ED Notes (Signed)
[place] in progress. PT alert, NAD, calm, interactive. Tolerating well.

## 2021-03-18 NOTE — ED Notes (Signed)
Alert. Drinking juice. Given Happy meal, PB, graham crackers, [place] sandwich.

## 2021-03-18 NOTE — H&P (Signed)
History and Physical    [name] OZD:[id number] DOB: 19-Mar-1941 DOA: 03/18/2021  PCP: [name], MD (Confirm with patient/family/NH records and if not entered, this has to be entered at [facility] point of entry) Patient coming from: SNF  I have personally briefly reviewed patient's old medical records in [place]  Chief Complaint: Feeling weak  HPI: [name] is a 80 y.o. female with medical history significant of CAD with recent CABG in 01/2021, HTN, IIDM, CKD stge IIIb, HLD, AAA was sent from nursing home for evaluation of altered mentations and hypoglycemia.  Patient underwent CABG in September and discharged to nursing home for rehab.  Lately, patient reported she has had poor oral intake for about 4 weeks, with easily fullness, which she has reduced her diet to frequent meals with some relief.  She also reported that sometimes she will feel "food stuck in the throat", as a result she has modified her diet to eat more soup and noodles.  Yesterday she was told that she lost about 13 pounds since September.  Yesterday, she started to feel generalized weakness, nausea, but no vomiting and last night she started to develop severe watery diarrhea, no abdominal pain no fever or chills.  She felt very dry as a result.  This morning, patient was found somnolent, fingerstick check glucose 55, patient was given D50 and EMS was called.  In the ED, 1 L of LR bolus given and patient mentation improved initially, then patient became confused and lethargic again recheck fingerstick 66 and D50 given.  COVID test turned positive.  She was COVID vaccinated x2 but no boosters.  Review of Systems: As per HPI otherwise 14 point review of systems negative.    Past Medical History:  Diagnosis Date   AAA (abdominal aortic aneurysm)    Arthritis    osteoarthritis   Constipation    DM with vascular complications    Type 2, on Glimiperide   GERD (gastroesophageal reflux disease)     Glaucoma    Hyperlipidemia    Hypertension    Kidney stones    Myocardial infarction [facility])    Peripheral vascular disease ([place])    Pneumonia    Stroke ([place]) 01/28/2015   with left side weakness    TIA (transient ischemic attack)    Urinary incontinence     Past Surgical History:  Procedure Laterality Date   ABDOMINAL AORTAGRAM N/A 03/13/2014   Procedure: ABDOMINAL Maxcine Ham;  Surgeon: Elam Dutch, MD;  Location: [facility] CATH LAB;  Service: Cardiovascular;  Laterality: N/A;   ABDOMINAL AORTIC ANEURYSM REPAIR  ?2012   ABDOMINAL HYSTERECTOMY     partial   BACK SURGERY     CARDIAC CATHETERIZATION     CORONARY ANGIOPLASTY  03/30/2015   CORONARY ARTERY BYPASS GRAFT N/A 01/17/2021   Procedure: CORONARY ARTERY BYPASS GRAFTING (CABG) TIMES 3 , ON PUMP, USING LEFT INTERNAL MAMMARY ARTERY AND ENDOSCOPICALLY HARVESTED RIGHT GREATER SAPHENOUS VEIN;  Surgeon: Lajuana Matte, MD;  Location: [place];  Service: Open Heart Surgery;  Laterality: N/A;   ENDOVEIN HARVEST OF GREATER SAPHENOUS VEIN Right 01/17/2021   Procedure: ENDOVEIN HARVEST OF GREATER SAPHENOUS VEIN;  Surgeon: Lajuana Matte, MD;  Location: [place];  Service: Open Heart Surgery;  Laterality: Right;   EYE SURGERY Right    laser surgery for blood behind eye, loss of sight   FEMORAL-POPLITEAL BYPASS GRAFT Left 07/06/2014   Procedure: BYPASS GRAFT FEMORAL-POPLITEAL ARTERY;  Surgeon: Elam Dutch, MD;  Location: [facility]  OR;  Service: Vascular;  Laterality: Left;   HERNIA REPAIR     JOINT REPLACEMENT     LEFT HEART CATH AND CORONARY ANGIOGRAPHY N/A 01/11/2021   Procedure: LEFT HEART CATH AND CORONARY ANGIOGRAPHY;  Surgeon: [name], MD;  Location: [facility] CV LAB;  Service: Cardiovascular;  Laterality: N/A;   LEFT HEART CATHETERIZATION WITH CORONARY ANGIOGRAM N/A 04/12/2014   Procedure: LEFT HEART CATHETERIZATION WITH CORONARY ANGIOGRAM;  Surgeon: Clent Demark, MD;  Location: [place] CATH LAB;  Service: Cardiovascular;   Laterality: N/A;   PERCUTANEOUS CORONARY STENT INTERVENTION (PCI-S)  04/12/2014   Procedure: PERCUTANEOUS CORONARY STENT INTERVENTION (PCI-S);  Surgeon: Clent Demark, MD;  Location: [facility] CATH LAB;  Service: Cardiovascular;;  prox and mid RCA   PERIPHERAL VASCULAR INTERVENTION  06/25/2019   Procedure: PERIPHERAL VASCULAR INTERVENTION;  Surgeon: Elam Dutch, MD;  Location: [place] CV LAB;  Service: Cardiovascular;;  Lt Renal and Rt Renal   RENAL ANGIOGRAPHY Bilateral 06/25/2019   Procedure: RENAL ANGIOGRAPHY;  Surgeon: Elam Dutch, MD;  Location: [place] CV LAB;  Service: Cardiovascular;  Laterality: Bilateral;   TEE WITHOUT CARDIOVERSION N/A 01/17/2021   Procedure: TRANSESOPHAGEAL ECHOCARDIOGRAM (TEE);  Surgeon: Lajuana Matte, MD;  [place]  Service: Open Heart Surgery;  Laterality: N/A;   TOTAL HIP ARTHROPLASTY Left 05/18/2015   Procedure: LEFT TOTAL HIP ARTHROPLASTY ANTERIOR APPROACH;  Surgeon: Mcarthur Rossetti, MD;  Location: [place];  Service: Orthopedics;  Laterality: Left;   TOTAL KNEE ARTHROPLASTY Left ~ 2003   TUBAL LIGATION     uterine tumor     VENTRAL HERNIA REPAIR       reports that she has quit smoking. Her smoking use included cigarettes. She has a 20.00 pack-year smoking history. She has never used smokeless tobacco. She reports that she does not drink alcohol and does not use drugs.  Allergies  Allergen Reactions   Keflex [Cephalexin] Shortness Of Breath and Other (See Comments)    Chest pain, headache   Lipitor [Atorvastatin] Shortness Of Breath and Other (See Comments)    Severe pain/cramping in legs- "I cannot walk, talk, or breathe" (pt is currently taking 20 mg daily 10/11/19)   Codeine Other (See Comments)    Disrupted patient's equilibrium- "Made my world flip upside down"   Hydrochlorothiazide Other (See Comments)    Other reaction(s): lightheaded, nausea   Hydrocodone-Acetaminophen Other (See Comments)    Other reaction(s):  "get crazy"   Metformin Hcl Other (See Comments)    Other reaction(s): upset stomach   Tramadol Hcl Other (See Comments)    Other reaction(s): "get crazy"   Azor [Amlodipine-Olmesartan] Swelling, Palpitations and Rash    Amlodipine alone resumed 12/2013, tolerating   Lisinopril Cough   Penicillins Dermatitis and Other (See Comments)    Did it involve swelling of the face/tongue/throat, SOB, or low BP? No, just worsened eczema Did it involve sudden or severe rash/hives, skin peeling, or any reaction on the inside of your mouth or nose? No Did you need to seek medical attention at a hospital or doctor's office? No When did it last happen? "More than 10 years ago" If all above answers are "NO", may proceed with cephalosporin use.     Family History  Problem Relation Age of Onset   Diabetes Mother    Stroke Mother    Hypertension Father    Stroke Father    Hyperlipidemia Sister    Hypertension Sister    Aneurysm Sister  Hyperlipidemia Brother    Hypertension Brother      Prior to Admission medications   Medication Sig Start Date End Date Taking? Authorizing Provider  acetaminophen (TYLENOL) 650 MG CR tablet Take 650 mg by mouth at bedtime as needed for pain.    Yes [provider]  albuterol (PROVENTIL HFA;VENTOLIN HFA) 108 (90 Base) MCG/ACT inhaler Inhale 2 puffs into the lungs every 4 (four) hours as needed for wheezing or shortness of breath (or coughing). 1/61/09  Yes Delora Fuel, MD  amLODipine (NORVASC) 10 MG tablet Take 10 mg by mouth daily. 09/01/19  Yes [provider]  aspirin EC 81 MG EC tablet Take 1 tablet (81 mg total) by mouth daily. Swallow whole. 01/28/21  Yes Roddenberry, Myron G, PA-C  busPIRone (BUSPAR) 5 MG tablet Take 1 tablet (5 mg total) by mouth 2 (two) times daily. 10/15/19  Yes Georgette Shell, MD  clopidogrel (PLAVIX) 75 MG tablet Take 1 tablet (75 mg total) by mouth daily. 01/28/21  Yes Roddenberry, Arlis Porta, PA-C  famotidine (PEPCID)  10 MG tablet Take 10 mg by mouth 2 (two) times daily.   Yes [provider]  glimepiride (AMARYL) 2 MG tablet Take 2 mg by mouth daily with breakfast.   Yes [provider]  isosorbide mononitrate (IMDUR) 30 MG 24 hr tablet Take 1 tablet (30 mg total) by mouth daily. 01/28/21  Yes Roddenberry, Arlis Porta, PA-C  losartan (COZAAR) 25 MG tablet Take 25 mg by mouth daily.   Yes [provider]  Menthol, Topical Analgesic, (BIOFREEZE EX) Apply topically. Apply topically to affected areas three times daily as needed for pain.   Yes [provider]  pantoprazole (PROTONIX) 40 MG tablet Take 1 tablet (40 mg total) by mouth daily. 01/28/21  Yes Roddenberry, Arlis Porta, PA-C  rosuvastatin (CRESTOR) 10 MG tablet Take 1 tablet (10 mg total) by mouth at bedtime. 01/27/21  Yes Roddenberry, Arlis Porta, PA-C  simethicone (MYLICON) 604 MG chewable tablet Chew 125 mg by mouth in the morning and at bedtime.   Yes [provider]  varenicline (CHANTIX) 0.5 MG tablet Take 0.5 mg by mouth 2 (two) times daily.   Yes [provider]  vitamin B-12 (CYANOCOBALAMIN) 250 MCG tablet Take 1 tablet (250 mcg total) by mouth daily. 01/28/21  Yes Roddenberry, Arlis Porta, PA-C  insulin aspart (NOVOLOG) 100 UNIT/ML injection Inject 5 Units into the skin. For CBG equal or greater than 200    [provider]  metoprolol tartrate (LOPRESSOR) 25 MG tablet Take 1 tablet (25 mg total) by mouth 2 (two) times daily. 03/02/21   Medina-VargasJaymes Graff C, NP    Physical Exam: Vitals:   03/18/21 1115 03/18/21 1125 03/18/21 1130 03/18/21 1145  BP: (!) 184/81  (!) 175/87 (!) 174/77  Pulse: 68  71 72  Resp: 19  15 17   Temp:      TempSrc:      SpO2: 100%  99% 100%  Weight:  71.2 kg      Constitutional: NAD, calm, comfortable Vitals:   03/18/21 1115 03/18/21 1125 03/18/21 1130 03/18/21 1145  BP: (!) 184/81  (!) 175/87 (!) 174/77  Pulse: 68  71 72  Resp: 19  15 17   Temp:      TempSrc:      SpO2:  100%  99% 100%  Weight:  71.2 kg     Eyes: PERRL, lids and conjunctivae normal ENMT: Mucous membranes are dry. Posterior pharynx clear of any exudate  or lesions.Normal dentition.  Neck: normal, supple, no masses, no thyromegaly Respiratory: clear to auscultation bilaterally, no wheezing, no crackles. Normal respiratory effort. No accessory muscle use.  Cardiovascular: Regular rate and rhythm, no murmurs / rubs / gallops. No extremity edema. 2+ pedal pulses. No carotid bruits.  Abdomen: no tenderness, no masses palpated. No hepatosplenomegaly. Bowel sounds positive.  Musculoskeletal: no clubbing / cyanosis. No joint deformity upper and lower extremities. Good ROM, no contractures. Normal muscle tone.  Skin: no rashes, lesions, ulcers. No induration Neurologic: CN 2-12 grossly intact. Sensation intact, DTR normal. Strength 5/5 in all 4.  Psychiatric: Normal judgment and insight. Alert and oriented x 3. Normal mood.     Labs on Admission: I have personally reviewed following labs and imaging studies  CBC: Recent Labs  Lab 03/18/21 1145  WBC 5.2  NEUTROABS 3.7  HGB 10.4*  HCT 32.9*  MCV 90.1  PLT 502   Basic Metabolic Panel: Recent Labs  Lab 03/18/21 1145  NA 135  K 4.3  CL 107  CO2 19*  GLUCOSE 107*  BUN 45*  CREATININE 4.84*  CALCIUM 8.7*   GFR: Estimated Creatinine Clearance: 8.9 mL/min (A) (by C-G formula based on SCr of 4.84 mg/dL (H)). Liver Function Tests: Recent Labs  Lab 03/18/21 1145  AST 20  ALT 12  ALKPHOS 52  BILITOT 0.5  PROT 7.3  ALBUMIN 3.4*   Recent Labs  Lab 03/18/21 1145  LIPASE 47   Recent Labs  Lab 03/18/21 1145  AMMONIA 15   Coagulation Profile: No results for input(s): INR, PROTIME in the last 168 hours. Cardiac Enzymes: No results for input(s): CKTOTAL, CKMB, CKMBINDEX, TROPONINI in the last 168 hours. BNP (last 3 results) No results for input(s): PROBNP in the last 8760 hours. HbA1C: No results for input(s): HGBA1C in the  last 72 hours. CBG: Recent Labs  Lab 03/18/21 1104 03/18/21 1212  GLUCAP 148* 61*   Lipid Profile: No results for input(s): CHOL, HDL, LDLCALC, TRIG, CHOLHDL, LDLDIRECT in the last 72 hours. Thyroid Function Tests: Recent Labs    03/18/21 1145  TSH 0.745   Anemia Panel: No results for input(s): VITAMINB12, FOLATE, FERRITIN, TIBC, IRON, RETICCTPCT in the last 72 hours. Urine analysis:    Component Value Date/Time   COLORURINE STRAW (A) 01/17/2021 1025   APPEARANCEUR CLEAR 01/17/2021 1025   LABSPEC 1.006 01/17/2021 1025   PHURINE 7.0 01/17/2021 1025   GLUCOSEU 150 (A) 01/17/2021 1025   HGBUR NEGATIVE 01/17/2021 1025   BILIRUBINUR NEGATIVE 01/17/2021 1025   KETONESUR NEGATIVE 01/17/2021 1025   PROTEINUR NEGATIVE 01/17/2021 1025   UROBILINOGEN 0.2 07/04/2014 0319   NITRITE NEGATIVE 01/17/2021 1025   LEUKOCYTESUR NEGATIVE 01/17/2021 1025    Radiological Exams on Admission: CT HEAD WO CONTRAST (5MM)  Result Date: 03/18/2021 CLINICAL DATA:  Mental status change EXAM: CT HEAD WITHOUT CONTRAST TECHNIQUE: Contiguous axial images were obtained from the base of the skull through the vertex without intravenous contrast. COMPARISON:  10/11/2019 FINDINGS: Brain: No evidence of acute infarction, hemorrhage, hydrocephalus, extra-axial collection or mass lesion/mass effect. Vascular: No hyperdense vessel or unexpected calcification. Skull: Normal. Negative for fracture or focal lesion. Sinuses/Orbits: No acute finding. Other: None. IMPRESSION: No acute intracranial abnormality. Electronically Signed   By: Miachel Roux M.D.   On: 03/18/2021 12:33   DG Chest Portable 1 View  Result Date: 03/18/2021 CLINICAL DATA:  An 80 year old female presents with productive cough, hyperglycemia and chills. EXAM: PORTABLE CHEST 1 VIEW COMPARISON:  Comparison made with January 31, 2021. FINDINGS: Image rotated slightly to the LEFT. Cardiomediastinal contours and hilar structures are stable. Post median  sternotomy with signs of CABG. EKG leads project over the chest. Lungs are clear. No sign of effusion on frontal radiograph. No pneumothorax. On limited assessment there is no acute skeletal process. IMPRESSION: No active cardiopulmonary disease. Electronically Signed   By: Zetta Bills M.D.   On: 03/18/2021 11:59    EKG: Independently reviewed. Sinus, no acute ST-T changes.  Assessment/Plan Active Problems:   AKI (acute kidney injury) ([place])   COVID-19 virus infection  (please populate well all problems here in Problem List. (For example, if patient is on BP meds at home and you resume or decide to hold them, it is a problem that needs to be her. Same for CAD, COPD, HLD and so on)  Hypoglycemia -From an acute on subacute anorexia plus minus dysphagia.  The acute phase appears to related to COVID infection which also caused other GI symptoms such as feeling nausea and diarrhea. -Will start D5NS and encourage p.o. intake. -Hold off of Jardiance given there is AKI, start sliding scale, check A1c.  COVID infection -No S/S of viral PNA -Given her underlying risk factors, will treat one short course of Remdisivir.  AKI on CKD stage III -Clinically looks dehydrated, probably due to the sudden GI loss -No symptoms or symptoms or signs of electrolytes imbalance, uremia or acid-base disturbance, will correct dehydration with IVF and re-evaluate. -Renal ultrasound.  Unintentional weight loss and suspected dysphagia, moderate protein calorie malnutrition -Lost about 14-15 lbs in a bit more than one month, has intermittent trouble swallowing large chunky solid food, appears to not have enough intake despite switch to small frequent meals.  Will consult speech for swallow evaluation.  Consult dietitian.  HTN uncontrolled -Resume home regimen of amlodipine, Imdur, hold ARB due to worsening kidney function, change metoprolol to Coreg, add as needed hydralazine.  DVT prophylaxis: Heparin subcu Code  Status: Full code Family Communication: Daughter over the phone Disposition Plan: Expect more than 2 midnight hospital stay, expect discharge back to SNF Consults called: None Admission status: Tele admit   Lequita Halt MD [facility] Pager [phone]  03/18/2021, 3:09 PM

## 2021-03-18 NOTE — ED Triage Notes (Signed)
BIB [facility] from [facility] on [address] for hypoglycemia, stroke scale negative, sx onset noted upon waking, BS PTA 55, dextrose given IV, rechecked on arrival in ED as 139, was somnolent, responded to only pain, now is A&Ox4, mentions recent chills, cough and diarrhea, and some lightheadedness, (denies: pain, fever, sob, NV). CABG here at [facility] [place] ~ 1 month ago.

## 2021-03-19 ENCOUNTER — Inpatient Hospital Stay (HOSPITAL_COMMUNITY): Payer: Medicare Other

## 2021-03-19 DIAGNOSIS — N179 Acute kidney failure, unspecified: Secondary | ICD-10-CM

## 2021-03-19 DIAGNOSIS — R1310 Dysphagia, unspecified: Secondary | ICD-10-CM

## 2021-03-19 LAB — GLUCOSE, CAPILLARY
Glucose-Capillary: 127 mg/dL — ABNORMAL HIGH (ref 70–99)
Glucose-Capillary: 134 mg/dL — ABNORMAL HIGH (ref 70–99)
Glucose-Capillary: 170 mg/dL — ABNORMAL HIGH (ref 70–99)
Glucose-Capillary: 129 mg/dL — ABNORMAL HIGH (ref 70–99)

## 2021-03-19 LAB — CBC
HCT: 32.6 % — ABNORMAL LOW (ref 36.0–46.0)
Hemoglobin: 10.6 g/dL — ABNORMAL LOW (ref 12.0–15.0)
MCH: 28.8 pg (ref 26.0–34.0)
MCHC: 32.5 g/dL (ref 30.0–36.0)
MCV: 88.6 fL (ref 80.0–100.0)
Platelets: 165 10*3/uL (ref 150–400)
RBC: 3.68 MIL/uL — ABNORMAL LOW (ref 3.87–5.11)
RDW: 15.7 % — ABNORMAL HIGH (ref 11.5–15.5)
WBC: 5.9 10*3/uL (ref 4.0–10.5)
nRBC: 0 % (ref 0.0–0.2)

## 2021-03-19 LAB — BASIC METABOLIC PANEL
Anion gap: 7 (ref 5–15)
BUN: 38 mg/dL — ABNORMAL HIGH (ref 8–23)
CO2: 18 mmol/L — ABNORMAL LOW (ref 22–32)
Calcium: 8.6 mg/dL — ABNORMAL LOW (ref 8.9–10.3)
Chloride: 112 mmol/L — ABNORMAL HIGH (ref 98–111)
Creatinine, Ser: 3.97 mg/dL — ABNORMAL HIGH (ref 0.44–1.00)
GFR, Estimated: 11 mL/min — ABNORMAL LOW (ref >60)
Glucose, Bld: 125 mg/dL — ABNORMAL HIGH (ref 70–99)
Potassium: 4.8 mmol/L (ref 3.5–5.1)
Sodium: 137 mmol/L (ref 135–145)

## 2021-03-19 LAB — C DIFFICILE QUICK SCREEN W PCR REFLEX
C Diff antigen: NEGATIVE
C Diff interpretation: NOT DETECTED
C Diff toxin: NEGATIVE

## 2021-03-19 LAB — D-DIMER, QUANTITATIVE: D-Dimer, Quant: 5.62 ug/mL-FEU — ABNORMAL HIGH (ref 0.00–0.50)

## 2021-03-19 LAB — C-REACTIVE PROTEIN: CRP: 1.2 mg/dL — ABNORMAL HIGH (ref <1.0)

## 2021-03-19 LAB — TROPONIN I (HIGH SENSITIVITY): Troponin I (High Sensitivity): 24 ng/L — ABNORMAL HIGH (ref <18)

## 2021-03-19 LAB — FERRITIN: Ferritin: 450 ng/mL — ABNORMAL HIGH (ref 11–307)

## 2021-03-19 MED ORDER — ADULT MULTIVITAMIN W/MINERALS CH
1.0000 | ORAL_TABLET | Freq: Every day | ORAL | Status: DC
  Administered 2021-03-19 – 2021-03-22 (×4): 1 via ORAL
  Filled 2021-03-19 (×4): qty 1

## 2021-03-19 MED ORDER — CARVEDILOL 6.25 MG PO TABS
6.2500 mg | ORAL_TABLET | Freq: Two times a day (BID) | ORAL | Status: DC
  Administered 2021-03-19 – 2021-03-22 (×6): 6.25 mg via ORAL
  Filled 2021-03-19 (×6): qty 1

## 2021-03-19 MED ORDER — ENSURE ENLIVE PO LIQD
237.0000 mL | Freq: Three times a day (TID) | ORAL | Status: DC
  Administered 2021-03-19 – 2021-03-22 (×7): 237 mL via ORAL
  Filled 2021-03-19: qty 237

## 2021-03-19 MED ORDER — HYDRALAZINE HCL 20 MG/ML IJ SOLN: 10.0000 mg | Freq: Four times a day (QID) | INTRAMUSCULAR | Status: DC | PRN

## 2021-03-19 NOTE — Progress Notes (Signed)
PROGRESS NOTE        PATIENT DETAILS Name: [name] Age: 80 y.o. Sex: female Date of Birth: 07-Jan-1941 Admit Date: 03/18/2021 Admitting Physician Lequita Halt, MD [id number], Ivin Booty, MD  Brief Narrative: Patient is a 80 y.o. female with history of recent CABG (August 2022), HTN, DM-2, CKD stage IIIb, HLD-presented from SNF for weakness, hypoglycemia, diarrhea.  She was found to have COVID-19 infection.  Subjective: Lying comfortably in bed-denies any chest pain or shortness of breath.  She feels much better today.  Objective: Vitals: Blood pressure (!) 179/92, pulse 97, temperature 97.6 F (36.4 C), temperature source Oral, resp. rate 19, height 5\' 7"  (1.702 m), weight 71.2 kg, SpO2 96 %.   Exam: Gen Exam:Alert awake-not in any distress HEENT:atraumatic, normocephalic Chest: B/L clear to auscultation anteriorly CVS:S1S2 regular Abdomen:soft non tender, non distended Extremities:no edema Neurology: Non focal Skin: no rash  Pertinent Labs/Radiology: WBC: 5.9 Hb: 10.6 Na: 137 K: 4.8 Creatinine: 3.97  10/21>>CXR: No pneumonia 10/21>> CT head: No acute intracranial abnormality. 10/21>> renal ultrasound: No hydronephrosis.  Assessment/Plan: AKI on CKD stage IIIb: Likely hemodynamically mediated-improving with supportive care-continue IV fluids-avoid nephrotoxic agents and continue with supportive care.  Hypoglycemia: Resolved-probably due to use of Amaryl and a setting of worsening renal function.  Encourage oral intake-decrease rate of IVF and stop later today.  Acute metabolic encephalopathy: Due to combination of hypoglycemia and AKI-resolved-CT head negative for structural abnormalities-she is awake/alert and answering all questions appropriately.  COVID-19 infection: Not hypoxic-no PNA on CXR-continue Remdesivir-we will plan on 3 days treatment.  Diarrhea: Probably due to COVID-19 infection-if she has diarrheas-reasonable to perform  C. difficile/GI pathogen panel to rule out alternative organisms.  Elevated D-dimer: Probably due to a combination of COVID-19 infection-recent CABG-she is not hypoxic-does not have swelling of her lower extremities-we will check lower extremity Dopplers and watch closely.  Remains on prophylactic heparin.  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  HLD: Continue statin  DM-2 (A1c 9.1 on 8/22): Presented with hypoglycemia-monitor CBGs closely-if CBGs stay consistently> 200-will initiate sliding scale insulin.  Given severity of AKI-and risk for hypoglycemia-reasonable to continue to hold Amaryl.  Dysphagia: Appears to both solids/liquids-we will await SLP evaluation-we will order a barium esophagogram.  Unintentional weight loss: TSH within normal limits-suspect could have been from recent hospitalization-we will consult nutrition services-maximize supplements-and defer further work-up to the outpatient setting.   Procedures: None Consults: None DVT Prophylaxis: SQ Heparin Code Status:Full code  Family Communication: None at bedside  Time spent: 35 minutes-Greater than 50% of this time was spent in counseling, explanation of diagnosis, planning of further management, and coordination of care.  Diet: Diet Order             Diet renal/carb modified with fluid restriction Diet-HS Snack? Nothing; Fluid restriction: 1200 mL Fluid; Room service appropriate? Yes; Fluid consistency: Thin  Diet effective now                     Disposition Plan: Status is: Inpatient  Remains inpatient appropriate because: Severity of illness-needing IV treatment.   Barriers to Discharge: Improving AKI-resolving hypoglycemia-dysphagia-COVID-19 infection times Remdesivir  Antimicrobial agents: Anti-infectives (From admission, onward)    Start     Dose/Rate  Route  Frequency Ordered Stop   03/19/21 1000  remdesivir 100 mg in sodium chloride 0.9 % 100 mL IVPB       See Hyperspace for full Linked Orders Report.   100 mg 200 mL/hr over 30 Minutes Intravenous Daily 03/18/21 1500 03/23/21 0959   03/18/21 1515  remdesivir 200 mg in sodium chloride 0.9% 250 mL IVPB       See Hyperspace for full Linked Orders Report.   200 mg 580 mL/hr over 30 Minutes Intravenous Once 03/18/21 1500          MEDICATIONS: Scheduled Meds:  albuterol  2 puff Inhalation Q6H   amLODipine  10 mg Oral Daily   aspirin EC  81 mg Oral Daily   busPIRone  5 mg Oral BID   carvedilol  3.125 mg Oral BID WC   clopidogrel  75 mg Oral Daily   famotidine  10 mg Oral BID   feeding supplement  237 mL Oral BID BM   heparin  5,000 Units Subcutaneous Q12H   insulin aspart  0-9 Units Subcutaneous TID WC   isosorbide mononitrate  30 mg Oral Daily   pantoprazole  40 mg Oral Daily   rosuvastatin  10 mg Oral QHS   varenicline  0.5 mg Oral BID   Continuous Infusions:  dextrose 5 % and 0.9% NaCl 100 mL/hr at 03/19/21 9357   remdesivir 200 mg in sodium chloride 0.9% 250 mL IVPB     Followed by   remdesivir 100 mg in NS 100 mL 100 mg (03/19/21 1044)   PRN Meds:.acetaminophen, albuterol, guaiFENesin-dextromethorphan, hydrALAZINE, ondansetron **OR** ondansetron (ZOFRAN) IV   I have personally reviewed following labs and imaging studies  LABORATORY DATA: CBC: Recent Labs  Lab 03/18/21 1145 03/19/21 0406  WBC 5.2 5.9  NEUTROABS 3.7  --   HGB 10.4* 10.6*  HCT 32.9* 32.6*  MCV 90.1 88.6  PLT 151 017    Basic Metabolic Panel: Recent Labs  Lab 03/18/21 1145 03/19/21 0406  NA 135 137  K 4.3 4.8  CL 107 112*  CO2 19* 18*  GLUCOSE 107* 125*  BUN 45* 38*  CREATININE 4.84* 3.97*  CALCIUM 8.7* 8.6*    GFR: Estimated Creatinine Clearance: 10.8 mL/min (A) (by C-G formula based on SCr of 3.97 mg/dL (H)).  Liver Function Tests: Recent Labs  Lab 03/18/21 1145  AST 20  ALT 12   ALKPHOS 52  BILITOT 0.5  PROT 7.3  ALBUMIN 3.4*   Recent Labs  Lab 03/18/21 1145  LIPASE 47   Recent Labs  Lab 03/18/21 1145  AMMONIA 15    Coagulation Profile: No results for input(s): INR, PROTIME in the last 168 hours.  Cardiac Enzymes: No results for input(s): CKTOTAL, CKMB, CKMBINDEX, TROPONINI in the last 168 hours.  BNP (last 3 results) No results for input(s): PROBNP in the last 8760 hours.  Lipid Profile: No results for input(s): CHOL, HDL, LDLCALC, TRIG, CHOLHDL, LDLDIRECT in the last 72 hours.  Thyroid Function Tests: Recent Labs    03/18/21 1145  TSH 0.745    Anemia Panel: Recent Labs    03/18/21 1500 03/19/21 0406  FERRITIN 485* 450*    Urine analysis:    Component Value Date/Time   COLORURINE STRAW (A) 01/17/2021 1025   APPEARANCEUR CLEAR 01/17/2021 1025   LABSPEC 1.006 01/17/2021 1025   PHURINE 7.0 01/17/2021 1025   GLUCOSEU 150 (A) 01/17/2021 1025   HGBUR NEGATIVE 01/17/2021 [place] 01/17/2021 1025   KETONESUR  NEGATIVE 01/17/2021 1025   PROTEINUR NEGATIVE 01/17/2021 1025   UROBILINOGEN 0.2 07/04/2014 0319   NITRITE NEGATIVE 01/17/2021 1025   LEUKOCYTESUR NEGATIVE 01/17/2021 1025    Sepsis Labs: Lactic Acid, Venous    Component Value Date/Time   LATICACIDVEN 2.1 (HH) 03/18/2021 1314    MICROBIOLOGY: Recent Results (from the past 240 hour(s))  Resp Panel by RT-PCR (Flu A&B, Covid) Nasopharyngeal Swab     Status: Abnormal   Collection Time: 03/18/21 11:14 AM   Specimen: Nasopharyngeal Swab; Nasopharyngeal(NP) swabs in vial transport medium  Result Value Ref Range Status   SARS Coronavirus 2 by RT PCR POSITIVE (A) NEGATIVE Final    Comment: RESULT CALLED TO, READ BACK BY AND VERIFIED WITH: RN JOHN COOK [id number] BY CM (NOTE) SARS-CoV-2 target nucleic acids are DETECTED.  The SARS-CoV-2 RNA is generally detectable in upper respiratory specimens during the acute phase of infection. Positive results  are indicative of the presence of the identified virus, but do not rule out bacterial infection or co-infection with other pathogens not detected by the test. Clinical correlation with patient history and other diagnostic information is necessary to determine patient infection status. The expected result is Negative.  Fact Sheet for Patients: EntrepreneurPulse.com.au  Fact Sheet for Healthcare Providers: IncredibleEmployment.be  This test is not yet approved or cleared by the [place] FDA and  has been authorized for detection and/or diagnosis of SARS-CoV-2 by FDA under an Emergency Use Authorization (EUA).  This EUA will remain in effect (meaning this test can be  used) for the duration of  the COVID-19 declaration under Section 564(b)(1) of the Act, 21 U.S.C. section 360bbb-3(b)(1), unless the authorization is terminated or revoked sooner.     Influenza A by PCR NEGATIVE NEGATIVE Final   Influenza B by PCR NEGATIVE NEGATIVE Final    Comment: (NOTE) The Xpert Xpress SARS-CoV-2/FLU/RSV plus assay is intended as an aid in the diagnosis of influenza from Nasopharyngeal swab specimens and should not be used as a sole basis for treatment. Nasal washings and aspirates are unacceptable for Xpert Xpress SARS-CoV-2/FLU/RSV testing.  Fact Sheet for Patients: EntrepreneurPulse.com.au  Fact Sheet for Healthcare Providers: IncredibleEmployment.be  This test is not yet approved or cleared by the [place] FDA and has been authorized for detection and/or diagnosis of SARS-CoV-2 by FDA under an Emergency Use Authorization (EUA). This EUA will remain in effect (meaning this test can be used) for the duration of the COVID-19 declaration under Section 564(b)(1) of the Act, 21 U.S.C. section 360bbb-3(b)(1), unless the authorization is terminated or revoked.  Performed at [facility], [place] [address],  [place], [place] [address]     RADIOLOGY STUDIES/RESULTS: CT HEAD WO CONTRAST (5MM)  Result Date: 03/18/2021 CLINICAL DATA:  Mental status change EXAM: CT HEAD WITHOUT CONTRAST TECHNIQUE: Contiguous axial images were obtained from the base of the skull through the vertex without intravenous contrast. COMPARISON:  10/11/2019 FINDINGS: Brain: No evidence of acute infarction, hemorrhage, hydrocephalus, extra-axial collection or mass lesion/mass effect. Vascular: No hyperdense vessel or unexpected calcification. Skull: Normal. Negative for fracture or focal lesion. Sinuses/Orbits: No acute finding. Other: None. IMPRESSION: No acute intracranial abnormality. Electronically Signed   By: Miachel Roux M.D.   On: 03/18/2021 12:33   US RENAL  Result Date: 03/18/2021 CLINICAL DATA:  Acute renal insufficiency, hypertension EXAM: RENAL / URINARY TRACT ULTRASOUND COMPLETE COMPARISON:  03/22/2018 FINDINGS: Right Kidney: Renal measurements: 8.9 x 4.5 x 4.6 cm = volume: 97 mL. Normal echotexture. 1.3  cm simple cyst within the upper pole and a 1.3 cm simple cyst within the lower pole. No hydronephrosis or nephrolithiasis. Left Kidney: Renal measurements: 4.4 x 8.9 x 5.3 cm = volume: 109 mL. Echogenicity within normal limits. No mass or hydronephrosis visualized. Bladder: Appears normal for degree of bladder distention. Other: None. IMPRESSION: 1. Simple appearing right renal cysts. Otherwise unremarkable renal ultrasound. Electronically Signed   By: Randa Ngo M.D.   On: 03/18/2021 16:39   DG Chest Portable 1 View  Result Date: 03/18/2021 CLINICAL DATA:  An 80 year old female presents with productive cough, hyperglycemia and chills. EXAM: PORTABLE CHEST 1 VIEW COMPARISON:  Comparison made with January 31, 2021. FINDINGS: Image rotated slightly to the LEFT. Cardiomediastinal contours and hilar structures are stable. Post median sternotomy with signs of CABG. EKG leads project over the chest. Lungs are clear. No sign  of effusion on frontal radiograph. No pneumothorax. On limited assessment there is no acute skeletal process. IMPRESSION: No active cardiopulmonary disease. Electronically Signed   By: Zetta Bills M.D.   On: 03/18/2021 11:59     LOS: 1 day   Oren Binet, MD  [facility]    To contact the attending provider between 7A-7P or the covering provider during after hours 7P-7A, please log into the web site [email or web address] and access using universal [place] password for that web site. If you do not have the password, please call the hospital operator.  03/19/2021, 10:59 AM

## 2021-03-19 NOTE — Progress Notes (Signed)
Lower extremity venous study attempted. Patient with speech therapy. Will attempt again as schedule and patient availability permits.  Darlin Coco, RDMS, RVT

## 2021-03-19 NOTE — Progress Notes (Signed)
Initial Nutrition Assessment  DOCUMENTATION CODES:   Not applicable  INTERVENTION:   Liberalize diet to regular to help increase oral intake. Ensure Enlive po TID, each supplement provides 350 kcal and 20 grams of protein. MVI with minerals daily.  NUTRITION DIAGNOSIS:   Increased nutrient needs related to acute illness (COVID-19) as evidenced by estimated needs.  GOAL:   Patient will meet greater than or equal to 90% of their needs  MONITOR:   PO intake, Supplement acceptance  REASON FOR ASSESSMENT:   Consult Assessment of nutrition requirement/status  ASSESSMENT:   80 yo female admitted from SNF with AMS and hypoglycemia. Tested positive for COVID-19 on admission. PMH includes CAD, CABG September 2022, HTN, DM, CKD stage IIIb, HLD, AAA.   Patient was recently discharged to SNF for rehab in September after hospitalization for CABG. Weight loss of 13 lbs was reported as well as 4 weeks of poor oral intake x 4 weeks.   Weight history reviewed. Patient weighed 78.1 kg 01/27/21, now down to 71.2 kg. 9% weight loss within 3 months is severe.   RD working remotely. Unable to reach patient by phone. Suspect she is malnourished, but unable to obtain enough information at this time for identification of malnutrition.   Labs reviewed.  CBG: 127-134  Medications reviewed and include Pepcid, Protonix. IVF: D5 NS at 50 ml/h (to be discontinued today)  Discussed diet liberalization with attending physician. Okay to liberalize to regular, given significant weight loss with poor intake and increased nutrition needs.   NUTRITION - FOCUSED PHYSICAL EXAM:  Unable to complete  Diet Order:   Diet Order             Diet renal/carb modified with fluid restriction Diet-HS Snack? Nothing; Fluid restriction: 1200 mL Fluid; Room service appropriate? Yes; Fluid consistency: Thin  Diet effective now                   EDUCATION NEEDS:   No education needs have been identified at  this time  Skin:  Skin Assessment: Reviewed RN Assessment  Last BM:  10/21 type 7  Height:   Ht Readings from Last 1 Encounters:  03/19/21 5\' 7"  (1.702 m)    Weight:   Wt Readings from Last 1 Encounters:  03/18/21 71.2 kg    BMI:  Body mass index is 24.59 kg/m.  Estimated Nutritional Needs:   Kcal:  1900-2100  Protein:  100-115 gm  Fluid:  >/= 1.9 L    Lucas Mallow, RD, LDN, CNSC Please refer to Amion for contact information.

## 2021-03-19 NOTE — Evaluation (Signed)
Clinical/Bedside Swallow Evaluation Patient Details  Name: [name] MRN: [id number] Date of Birth: 08-20-1940  Today's Date: 03/19/2021 Time: SLP Start Time (ACUTE ONLY): 0921 SLP Stop Time (ACUTE ONLY): 0945 SLP Time Calculation (min) (ACUTE ONLY): 24 min  Past Medical History:  Past Medical History:  Diagnosis Date   AAA (abdominal aortic aneurysm)    Arthritis    osteoarthritis   Constipation    DM with vascular complications    Type 2, on Glimiperide   GERD (gastroesophageal reflux disease)    Glaucoma    Hyperlipidemia    Hypertension    Kidney stones    Myocardial infarction [facility])    Peripheral vascular disease ([place])    Pneumonia    Stroke ([place]) 01/28/2015   with left side weakness    TIA (transient ischemic attack)    Urinary incontinence    Past Surgical History:  Past Surgical History:  Procedure Laterality Date   ABDOMINAL AORTAGRAM N/A 03/13/2014   Procedure: ABDOMINAL Maxcine Ham;  Surgeon: Elam Dutch, MD;  Location: [facility] LAB;  Service: Cardiovascular;  Laterality: N/A;   ABDOMINAL AORTIC ANEURYSM REPAIR  ?2012   ABDOMINAL HYSTERECTOMY     partial   BACK SURGERY     CARDIAC CATHETERIZATION     CORONARY ANGIOPLASTY  03/30/2015   CORONARY ARTERY BYPASS GRAFT N/A 01/17/2021   Procedure: CORONARY ARTERY BYPASS GRAFTING (CABG) TIMES 3 , ON PUMP, USING LEFT INTERNAL MAMMARY ARTERY AND ENDOSCOPICALLY HARVESTED RIGHT GREATER SAPHENOUS VEIN;  Surgeon: Lajuana Matte, MD;  Location: [place];  Service: Open Heart Surgery;  Laterality: N/A;   ENDOVEIN HARVEST OF GREATER SAPHENOUS VEIN Right 01/17/2021   Procedure: ENDOVEIN HARVEST OF GREATER SAPHENOUS VEIN;  Surgeon: Lajuana Matte, MD;  Location: [place];  Service: Open Heart Surgery;  Laterality: Right;   EYE SURGERY Right    laser surgery for blood behind eye, loss of sight   FEMORAL-POPLITEAL BYPASS GRAFT Left 07/06/2014   Procedure: BYPASS GRAFT FEMORAL-POPLITEAL ARTERY;  Surgeon: Elam Dutch, MD;  Location: [facility] OR;  Service: Vascular;  Laterality: Left;   HERNIA REPAIR     JOINT REPLACEMENT     LEFT HEART CATH AND CORONARY ANGIOGRAPHY N/A 01/11/2021   Procedure: LEFT HEART CATH AND CORONARY ANGIOGRAPHY;  Surgeon: [name], MD;  Location: [place] CV LAB;  Service: Cardiovascular;  Laterality: N/A;   LEFT HEART CATHETERIZATION WITH CORONARY ANGIOGRAM N/A 04/12/2014   Procedure: LEFT HEART CATHETERIZATION WITH CORONARY ANGIOGRAM;  Surgeon: Clent Demark, MD;  Location: [place] CATH LAB;  Service: Cardiovascular;  Laterality: N/A;   PERCUTANEOUS CORONARY STENT INTERVENTION (PCI-S)  04/12/2014   Procedure: PERCUTANEOUS CORONARY STENT INTERVENTION (PCI-S);  Surgeon: Clent Demark, MD;  Location: [facility] CATH LAB;  Service: Cardiovascular;;  prox and mid RCA   PERIPHERAL VASCULAR INTERVENTION  06/25/2019   Procedure: PERIPHERAL VASCULAR INTERVENTION;  Surgeon: Elam Dutch, MD;  Location: [place] CV LAB;  Service: Cardiovascular;;  Lt Renal and Rt Renal   RENAL ANGIOGRAPHY Bilateral 06/25/2019   Procedure: RENAL ANGIOGRAPHY;  Surgeon: Elam Dutch, MD;  Location: [place] CV LAB;  Service: Cardiovascular;  Laterality: Bilateral;   TEE WITHOUT CARDIOVERSION N/A 01/17/2021   Procedure: TRANSESOPHAGEAL ECHOCARDIOGRAM (TEE);  Surgeon: Lajuana Matte, MD;  Location: [place];  Service: Open Heart Surgery;  Laterality: N/A;   TOTAL HIP ARTHROPLASTY Left 05/18/2015   Procedure: LEFT TOTAL HIP ARTHROPLASTY ANTERIOR APPROACH;  Surgeon: Mcarthur Rossetti, MD;  Location: [place];  Service: Orthopedics;  Laterality: Left;   TOTAL KNEE ARTHROPLASTY Left ~ 2003   TUBAL LIGATION     uterine tumor     VENTRAL HERNIA REPAIR     HPI:  Pt is a 80 y.o. female who presents from nursing home for evaluation of altered mentations and hypoglycemia. Pt underwent CABG in September and discharged to nursing home for rehab.  Recently, pt reported poor oral intake and getting easily  full. She also reported sometimes the feeling of "food stuck in the throat", as a result she has modified her diet to eat more soup and noodles.  Previous BSE in hospital revealed no overt s/sx of aspiration, though c/o food "scratching her throat" at subsequent tx session. Regular/thin liquid diet recommended with no further SLP f/u recommended at that time. PMH: CAD with recent CABG in 01/2021, HTN, IIDM, CKD stge IIIb, HLD, AAA, CVA, GERD.    Assessment / Plan / Recommendation  Clinical Impression  Pt seen for bedside swallow evaluation this am alert and upright in bed. She denied any difficulty swallowing, but reports premature sense of fullness at times and poor appetite. She prefers softer foods at baseline. Oral mechanism examination unremarkable and pt wears upper dentures with missing lower dentition noted. She self-fed consecutive sips of thin liquids exhibiting no overt s/sx of aspiration across multiple trials. All solids consumed without difficulty, although mildly prolonged mastication observed with complex regular textured solid, suspect due to missing lower dentition. Oral clearance of all POs was adequate and pt denied any feelings of globus sensation this date. Recommend continue regular/thin liquid diet with adherence to universal swallow/reflux precautions. Pt refuses soft/modified diet at this time, although encouraged her to pick softer meal items when ordering. Suspect occasional premature fullness may be esophageal related, given hx, and GI w/u may be warranted. No further SLP f/u indicated at this time. Will s/o.  SLP Visit Diagnosis: Dysphagia, unspecified (R13.10)    Aspiration Risk  No limitations    Diet Recommendation Regular;Thin liquid   Liquid Administration via: Cup;Straw Medication Administration: Whole meds with liquid Supervision: Patient able to self feed Compensations: Minimize environmental distractions;Slow rate;Small sips/bites Postural Changes: Seated  upright at 90 degrees;Remain upright for at least 30 minutes after po intake    Other  Recommendations Recommended Consults: Consider GI evaluation Oral Care Recommendations: Oral care BID    Recommendations for follow up therapy are one component of a multi-disciplinary discharge planning process, led by the attending physician.  Recommendations may be updated based on patient status, additional functional criteria and insurance authorization.  Follow up Recommendations None      Frequency and Duration            Prognosis        Swallow Study   General Date of Onset: 03/18/21 HPI: Pt is a 80 y.o. female who presents from nursing home for evaluation of altered mentations and hypoglycemia. Pt underwent CABG in September and discharged to nursing home for rehab.  Recently, pt reported poor oral intake and getting easily full. She also reported sometimes the feeling of "food stuck in the throat", as a result she has modified her diet to eat more soup and noodles.  Previous BSE in hospital revealed no overt s/sx of aspiration, though c/o food "scratching her throat" at subsequent tx session. Regular/thin liquid diet recommended with no further SLP f/u recommended at that time. PMH: CAD with recent CABG in 01/2021, HTN, IIDM, CKD stge IIIb, HLD, AAA, CVA, GERD. Type  of Study: Bedside Swallow Evaluation Previous Swallow Assessment: see HPI Diet Prior to this Study: Regular;Thin liquids Temperature Spikes Noted: No Respiratory Status: Room air History of Recent Intubation: No Behavior/Cognition: Pleasant mood;Cooperative;Alert Oral Cavity Assessment: Within Functional Limits Oral Care Completed by SLP: No Oral Cavity - Dentition: Missing dentition;Dentures, top Vision: Functional for self-feeding Self-Feeding Abilities: Able to feed self Patient Positioning: Upright in bed;Postural control adequate for testing Baseline Vocal Quality: Normal Volitional Cough: Strong Volitional Swallow:  Able to elicit    Oral/Motor/Sensory Function Overall Oral Motor/Sensory Function: Within functional limits   Ice Chips Ice chips: Not tested   Thin Liquid Thin Liquid: Within functional limits Presentation: Straw;Self Fed    Nectar Thick Nectar Thick Liquid: Not tested   Honey Thick Honey Thick Liquid: Not tested   Puree Puree: Within functional limits Presentation: Self Fed;Spoon   Solid     Solid: Within functional limits Presentation: [place], [place], [place] Office Number: [phone]  Acie Fredrickson 03/19/2021,10:07 AM

## 2021-03-19 NOTE — Progress Notes (Signed)
   03/19/21 1806  Vitals  BP (!) 164/75  Pulse Rate 86  ECG Heart Rate 86  Resp 14  MEWS COLOR  MEWS Score Color Green  Oxygen Therapy  SpO2 96 %  MEWS Score  MEWS Temp 0  MEWS Systolic 0  MEWS Pulse 0  MEWS RR 0  MEWS LOC 0  MEWS Score 0  Provider Notification  Provider Name/Title Oren Binet, MD  Date Provider Notified 03/19/21  Time Provider Notified [id number]  Notification Type Page  Notification Reason Other (Comment) (BP 164/75)  Provider response See new orders ([place] BP over 180.)  Date of Provider Response 03/19/21  Time of Provider Response 1815

## 2021-03-20 ENCOUNTER — Inpatient Hospital Stay (HOSPITAL_COMMUNITY): Payer: Medicare Other

## 2021-03-20 DIAGNOSIS — R7989 Other specified abnormal findings of blood chemistry: Secondary | ICD-10-CM | POA: Diagnosis not present

## 2021-03-20 LAB — GASTROINTESTINAL PANEL BY PCR, STOOL (REPLACES STOOL CULTURE)

## 2021-03-20 LAB — CBC
HCT: 30.8 % — ABNORMAL LOW (ref 36.0–46.0)
Hemoglobin: 10.1 g/dL — ABNORMAL LOW (ref 12.0–15.0)
MCH: 28.9 pg (ref 26.0–34.0)
MCHC: 32.8 g/dL (ref 30.0–36.0)
MCV: 88 fL (ref 80.0–100.0)
Platelets: 151 10*3/uL (ref 150–400)
RBC: 3.5 MIL/uL — ABNORMAL LOW (ref 3.87–5.11)
RDW: 15.8 % — ABNORMAL HIGH (ref 11.5–15.5)
WBC: 6.5 10*3/uL (ref 4.0–10.5)
nRBC: 0 % (ref 0.0–0.2)

## 2021-03-20 LAB — URINE CULTURE: Culture: <10000 — AB

## 2021-03-20 LAB — COMPREHENSIVE METABOLIC PANEL
ALT: 14 U/L (ref 0–44)
AST: 18 U/L (ref 15–41)
Albumin: 3.1 g/dL — ABNORMAL LOW (ref 3.5–5.0)
Alkaline Phosphatase: 52 U/L (ref 38–126)
Anion gap: 6 (ref 5–15)
BUN: 34 mg/dL — ABNORMAL HIGH (ref 8–23)
CO2: 18 mmol/L — ABNORMAL LOW (ref 22–32)
Calcium: 8.8 mg/dL — ABNORMAL LOW (ref 8.9–10.3)
Chloride: 110 mmol/L (ref 98–111)
Creatinine, Ser: 4 mg/dL — ABNORMAL HIGH (ref 0.44–1.00)
GFR, Estimated: 11 mL/min — ABNORMAL LOW (ref >60)
Glucose, Bld: 116 mg/dL — ABNORMAL HIGH (ref 70–99)
Potassium: 5.5 mmol/L — ABNORMAL HIGH (ref 3.5–5.1)
Sodium: 134 mmol/L — ABNORMAL LOW (ref 135–145)
Total Bilirubin: 0.6 mg/dL (ref 0.3–1.2)
Total Protein: 6.8 g/dL (ref 6.5–8.1)

## 2021-03-20 LAB — GLUCOSE, CAPILLARY
Glucose-Capillary: 106 mg/dL — ABNORMAL HIGH (ref 70–99)
Glucose-Capillary: 75 mg/dL (ref 70–99)
Glucose-Capillary: 118 mg/dL — ABNORMAL HIGH (ref 70–99)
Glucose-Capillary: 115 mg/dL — ABNORMAL HIGH (ref 70–99)
Glucose-Capillary: 165 mg/dL — ABNORMAL HIGH (ref 70–99)
Glucose-Capillary: 127 mg/dL — ABNORMAL HIGH (ref 70–99)

## 2021-03-20 LAB — D-DIMER, QUANTITATIVE: D-Dimer, Quant: 4.37 ug/mL-FEU — ABNORMAL HIGH (ref 0.00–0.50)

## 2021-03-20 MED ORDER — SODIUM ZIRCONIUM CYCLOSILICATE 10 G PO PACK
10.0000 g | PACK | Freq: Three times a day (TID) | ORAL | Status: DC
  Administered 2021-03-20 (×2): 10 g via ORAL
  Filled 2021-03-20 (×2): qty 1

## 2021-03-20 MED ORDER — ISOSORBIDE MONONITRATE ER 60 MG PO TB24
60.0000 mg | ORAL_TABLET | Freq: Every day | ORAL | Status: DC
  Administered 2021-03-21 – 2021-03-22 (×2): 60 mg via ORAL
  Filled 2021-03-20 (×2): qty 1

## 2021-03-20 MED ORDER — LACTATED RINGERS IV SOLN: INTRAVENOUS | Status: AC

## 2021-03-20 NOTE — Progress Notes (Signed)
Bilateral lower extremity venous duplex has been completed. Preliminary results can be found in CV Proc through chart review.   03/20/21 12:29 PM [name] RVT

## 2021-03-20 NOTE — Progress Notes (Addendum)
PROGRESS NOTE        PATIENT DETAILS Name: [name] Age: 80 y.o. Sex: female Date of Birth: 05/22/1941 Admit Date: 03/18/2021 Admitting Physician Lequita Halt, MD AQT:[id number], Ivin Booty, MD  Brief Narrative: Patient is a 80 y.o. female with history of recent CABG (August 2022), HTN, DM-2, CKD stage IIIb, HLD-presented from SNF for weakness, hypoglycemia, diarrhea.  She was found to have COVID-19 infection.   Pertinent Labs/Radiology: WBC: 5.9 Hb: 10.6 Na: 137 K: 4.8 Creatinine: 3.97  10/21>>CXR: No pneumonia 10/21>> CT head: No acute intracranial abnormality. 10/21>> renal ultrasound: No hydronephrosis.   Subjective:  Patient in bed, appears comfortable, denies any headache, no fever, no chest pain or pressure, no shortness of breath , no abdominal pain. No new focal weakness.    Objective: Vitals: Blood pressure (!) 155/75, pulse 74, temperature 98.2 F (36.8 C), temperature source Oral, resp. rate 13, height 5\' 7"  (1.702 m), weight 71.2 kg, SpO2 98 %.   Exam:  Awake Alert, No new F.N deficits, Normal affect [place].AT,PERRAL Supple Neck, No JVD,   Symmetrical Chest wall movement, Good air movement bilaterally, CTAB RRR,No Gallops, Rubs or new Murmurs,  +ve B.Sounds, Abd Soft, No tenderness,   No Cyanosis, Clubbing or edema,      Assessment/Plan:  AKI on CKD stage IIIb: Likely hemodynamically mediated-improving with supportive care-continue IV fluids x 15 hrs on 03/20/21 - avoid nephrotoxic agents and continue with supportive care. Lokelma x 3 for Hyperkalemia.  Hypoglycemia: Resolved-probably due to use of Amaryl and a setting of worsening renal function.  Encourage oral intake-decrease rate of IVF and stop later today.  Acute metabolic encephalopathy: Due to combination of hypoglycemia and AKI-resolved-CT head negative for structural abnormalities-she is awake/alert and answering all questions appropriately.  COVID-19 infection: Not  hypoxic-no PNA on CXR-continue Remdesivir-we will plan on 3 days treatment.  Diarrhea: Probably due to COVID-19 infection-if she has diarrheas-reasonable to perform C. difficile/GI pathogen panel to rule out alternative organisms.  Elevated D-dimer: Probably due to a combination of COVID-19 infection-recent CABG-she is not hypoxic-does not have swelling of her lower extremities-we will check lower extremity Dopplers and watch closely.  Remains on prophylactic heparin.  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  HLD: Continue statin  Dysphagia: Appears to both solids/liquids-we will await SLP evaluation-we will order a barium esophagogram.  Unintentional weight loss: TSH within normal limits-suspect could have been from recent hospitalization-we will consult nutrition services-maximize supplements-and defer further work-up to the outpatient setting.  DM-2 (A1c 9.1 on 8/22): Monitor of Meds  CBG (last 3)  Recent Labs    03/20/21 0300 03/20/21 0748 03/20/21 1204  GLUCAP 106* 75 118*    Lab Results  Component Value Date   HGBA1C 9.1 (H) 01/17/2021    Procedures: None Consults: None DVT Prophylaxis: SQ Heparin Code Status:Full code  Family Communication: None at bedside  Time spent: 35 minutes-Greater than 50% of this time was spent in counseling, explanation of diagnosis, planning of further management, and coordination of care.  Diet: Diet Order             Diet regular Room service appropriate? Yes; Fluid consistency: Thin  Diet effective now  Disposition Plan: Status is: Inpatient  Remains inpatient appropriate because: Severity of illness-needing IV treatment.   Barriers to Discharge: Improving AKI-resolving hypoglycemia-dysphagia-COVID-19 infection times Remdesivir  Antimicrobial agents: Anti-infectives  (From admission, onward)    Start     Dose/Rate Route Frequency Ordered Stop   03/19/21 1000  remdesivir 100 mg in sodium chloride 0.9 % 100 mL IVPB       See Hyperspace for full Linked Orders Report.   100 mg 200 mL/hr over 30 Minutes Intravenous Daily 03/18/21 1500 03/21/21 1105   03/18/21 1515  remdesivir 200 mg in sodium chloride 0.9% 250 mL IVPB       See Hyperspace for full Linked Orders Report.   200 mg 580 mL/hr over 30 Minutes Intravenous Once 03/18/21 1500          MEDICATIONS: Scheduled Meds:  albuterol  2 puff Inhalation Q6H   amLODipine  10 mg Oral Daily   aspirin EC  81 mg Oral Daily   busPIRone  5 mg Oral BID   carvedilol  6.25 mg Oral BID WC   clopidogrel  75 mg Oral Daily   famotidine  10 mg Oral BID   feeding supplement  237 mL Oral TID BM   heparin  5,000 Units Subcutaneous Q12H   [START ON 03/21/2021] isosorbide mononitrate  60 mg Oral Daily   multivitamin with minerals  1 tablet Oral Daily   pantoprazole  40 mg Oral Daily   rosuvastatin  10 mg Oral QHS   sodium zirconium cyclosilicate  10 g Oral TID   Continuous Infusions:  lactated ringers     remdesivir 200 mg in sodium chloride 0.9% 250 mL IVPB     Followed by   remdesivir 100 mg in NS 100 mL Stopped (03/20/21 0950)   PRN Meds:.acetaminophen, albuterol, guaiFENesin-dextromethorphan, hydrALAZINE, [DISCONTINUED] ondansetron **OR** ondansetron (ZOFRAN) IV   I have personally reviewed following labs and imaging studies  LABORATORY DATA: CBC: Recent Labs  Lab 03/18/21 1145 03/19/21 0406 03/20/21 0134  WBC 5.2 5.9 6.5  NEUTROABS 3.7  --   --   HGB 10.4* 10.6* 10.1*  HCT 32.9* 32.6* 30.8*  MCV 90.1 88.6 88.0  PLT 151 165 160    Basic Metabolic Panel: Recent Labs  Lab 03/18/21 1145 03/19/21 0406 03/20/21 0134  NA 135 137 134*  K 4.3 4.8 5.5*  CL 107 112* 110  CO2 19* 18* 18*  GLUCOSE 107* 125* 116*  BUN 45* 38* 34*  CREATININE 4.84* 3.97* 4.00*  CALCIUM 8.7* 8.6* 8.8*     GFR: Estimated Creatinine Clearance: 10.7 mL/min (A) (by C-G formula based on SCr of 4 mg/dL (H)).  Liver Function Tests: Recent Labs  Lab 03/18/21 1145 03/20/21 0134  AST 20 18  ALT 12 14  ALKPHOS 52 52  BILITOT 0.5 0.6  PROT 7.3 6.8  ALBUMIN 3.4* 3.1*   Recent Labs  Lab 03/18/21 1145  LIPASE 47   Recent Labs  Lab 03/18/21 1145  AMMONIA 15    Coagulation Profile: No results for input(s): INR, PROTIME in the last 168 hours.  Cardiac Enzymes: No results for input(s): CKTOTAL, CKMB, CKMBINDEX, TROPONINI in the last 168 hours.  BNP (last 3 results) No results for input(s): PROBNP in the last 8760 hours.  Lipid Profile: No results for input(s): CHOL, HDL, LDLCALC, TRIG, CHOLHDL, LDLDIRECT in the last 72 hours.  Thyroid Function Tests: Recent Labs    03/18/21 1145  TSH 0.745    Anemia Panel: Recent Labs  03/18/21 1500 03/19/21 0406  FERRITIN 485* 450*    Urine analysis:    Component Value Date/Time   COLORURINE STRAW (A) 01/17/2021 1025   APPEARANCEUR CLEAR 01/17/2021 1025   LABSPEC 1.006 01/17/2021 1025   PHURINE 7.0 01/17/2021 1025   GLUCOSEU 150 (A) 01/17/2021 1025   HGBUR NEGATIVE 01/17/2021 1025   BILIRUBINUR NEGATIVE 01/17/2021 1025   [place] 01/17/2021 1025   PROTEINUR NEGATIVE 01/17/2021 1025   UROBILINOGEN 0.2 07/04/2014 0319   NITRITE NEGATIVE 01/17/2021 1025   LEUKOCYTESUR NEGATIVE 01/17/2021 1025    Sepsis Labs: Lactic Acid, Venous    Component Value Date/Time   LATICACIDVEN 2.1 (HH) 03/18/2021 1314     RADIOLOGY STUDIES/RESULTS: US RENAL  Result Date: 03/18/2021 CLINICAL DATA:  Acute renal insufficiency, hypertension EXAM: RENAL / URINARY TRACT ULTRASOUND COMPLETE COMPARISON:  03/22/2018 FINDINGS: Right Kidney: Renal measurements: 8.9 x 4.5 x 4.6 cm = volume: 97 mL. Normal echotexture. 1.3 cm simple cyst within the upper pole and a 1.3 cm simple cyst within the lower pole. No hydronephrosis or  nephrolithiasis. Left Kidney: Renal measurements: 4.4 x 8.9 x 5.3 cm = volume: 109 mL. Echogenicity within normal limits. No mass or hydronephrosis visualized. Bladder: Appears normal for degree of bladder distention. Other: None. IMPRESSION: 1. Simple appearing right renal cysts. Otherwise unremarkable renal ultrasound. Electronically Signed   By: Randa Ngo M.D.   On: 03/18/2021 16:39     LOS: 2 days   Signature  Lala Lund M.D on 03/20/2021 at 1:20 PM   -  To page go to [email or web address]

## 2021-03-20 NOTE — Evaluation (Signed)
Occupational Therapy Evaluation Patient Details Name: [name] MRN: [id number] DOB: 1941-02-08 Today's Date: 03/20/2021   History of Present Illness Pt is an 80 y/o female admitted 10/21 secondary to hypoglycemia and chills/cough/diarrhea. Found to be COVID+. PMH includes CVA with L weakness, CAD s/p CABG, HTN, DM, and CKD.   Clinical Impression   [name] was generally mod I at SNF PTA, unaware of accuracy of PLOF due to cognitive deficits. Per pt, she ambulated with a rollator, and completed ADLs indep, she was active with PT/OT at SNF. Upon evaluation, pt was Ox2 and continued to confuse her current location with the SNF. She required supervision-min guard for mobility and ADLs given significantly increased time and verbal cues for safety. Pt is limited by activity tolerance, impulsivity and L sided weakness. She will benefit from continued OT acutely. Recommend d/c back to SNF.    Recommendations for follow up therapy are one component of a multi-disciplinary discharge planning process, led by the attending physician.  Recommendations may be updated based on patient status, additional functional criteria and insurance authorization.   Follow Up Recommendations  SNF;Supervision/Assistance - 24 hour    Equipment Recommendations  None recommended by OT       Precautions / Restrictions Precautions Precautions: Fall Restrictions Weight Bearing Restrictions: No      Mobility Bed Mobility Overal bed mobility: Needs Assistance Bed Mobility: Supine to Sit;Sit to Supine     Supine to sit: Supervision Sit to supine: Supervision   General bed mobility comments: Increased time required.    Transfers Overall transfer level: Needs assistance Equipment used: Rolling walker (2 wheeled);None Transfers: Sit to/from Stand Sit to Stand: Min guard         General transfer comment: Min guard A for safety. Cues for safe hand placement when using RW.    Balance Overall balance  assessment: Needs assistance Sitting-balance support: No upper extremity supported;Feet supported Sitting balance-Leahy Scale: Fair     Standing balance support: Bilateral upper extremity supported;During functional activity Standing balance-Leahy Scale: Poor Standing balance comment: Reliant on BUE support                           ADL either performed or assessed with clinical judgement   ADL Overall ADL's : Needs assistance/impaired Eating/Feeding: Independent;Sitting   Grooming: Set up;Sitting   Upper Body Bathing: Supervision/ safety;Set up;Sitting   Lower Body Bathing: Min guard;Sit to/from stand   Upper Body Dressing : Set up;Supervision/safety;Sitting   Lower Body Dressing: Min guard;Sit to/from stand   Toilet Transfer: Min guard;Ambulation;RW   Toileting- Clothing Manipulation and Hygiene: Supervision/safety;Sitting/lateral lean       Functional mobility during ADLs: Min guard;Rolling walker General ADL Comments: assistance levels for safety and verbal cues; pt is limited by impulsivity and poor insight into safety     Vision Baseline Vision/History: 0 No visual deficits Ability to See in Adequate Light: 0 Adequate Patient Visual Report: No change from baseline Vision Assessment?: No apparent visual deficits     Perception     Praxis      Pertinent Vitals/Pain Pain Assessment: No/denies pain     Hand Dominance Right   Extremity/Trunk Assessment Upper Extremity Assessment Upper Extremity Assessment: Generalized weakness;LUE deficits/detail LUE Deficits / Details: 3-/5 MMT grossly, poor coordination, AROM is WFL LUE Sensation: WNL LUE Coordination: decreased fine motor;decreased gross motor   Lower Extremity Assessment Lower Extremity Assessment: Defer to PT evaluation   Cervical / Trunk Assessment Cervical /  Trunk Assessment: Kyphotic   Communication Communication Communication: No difficulties   Cognition Arousal/Alertness:  Awake/alert Behavior During Therapy: Impulsive Overall Cognitive Status: No family/caregiver present to determine baseline cognitive functioning                                 General Comments: impulsivity noted, easily re-directed. Confusing hospital with SNF and "apartment" - unsure of baseline cognitive function. Oriented to person, place, unable to state why she is in the hospital, and 1922 as the year   General Comments  VSS on RA, no family present    Exercises     Shoulder Instructions      Home Living Family/patient expects to be discharged to:: Skilled nursing facility                                        Prior Functioning/Environment Level of Independence: Independent with assistive device(s)        Comments: Pt reports independence with use of rollator. Reports nursing staff does not have to help her ambulate or with ADLs. Unsure of accuracy given cognitive deficits. prefers to sponge bathe        OT Problem List: Decreased strength;Decreased range of motion;Decreased activity tolerance;Impaired balance (sitting and/or standing);Decreased safety awareness;Decreased knowledge of use of DME or AE;Decreased knowledge of precautions;Pain      OT Treatment/Interventions: Self-care/ADL training;Therapeutic exercise;Therapeutic activities;Patient/family education;Balance training;DME and/or AE instruction    OT Goals(Current goals can be found in the care plan section) Acute Rehab OT Goals Patient Stated Goal: to get some rest OT Goal Formulation: With patient Time For Goal Achievement: 04/03/21 Potential to Achieve Goals: Fair ADL Goals Pt Will Perform Upper Body Bathing: with modified independence;sitting Pt Will Perform Lower Body Bathing: with modified independence;sit to/from stand Pt Will Perform Upper Body Dressing: with modified independence;sitting Pt Will Perform Lower Body Dressing: with modified independence;sit to/from  stand Pt Will Transfer to Toilet: with modified independence;ambulating Pt/caregiver will Perform Home Exercise Program: Both right and left upper extremity;With written HEP provided;With Supervision Additional ADL Goal #1: pt will tolerate at least 5 minutes of OOB functional activity to improve activity tolerance fro ADLs  OT Frequency: Min 2X/week   Barriers to D/C:            Co-evaluation              AM-PAC OT "6 Clicks" Daily Activity     Outcome Measure Help from another person eating meals?: None Help from another person taking care of personal grooming?: A Little Help from another person toileting, which includes using toliet, bedpan, or urinal?: A Little Help from another person bathing (including washing, rinsing, drying)?: A Little Help from another person to put on and taking off regular upper body clothing?: A Little Help from another person to put on and taking off regular lower body clothing?: A Little 6 Click Score: 19   End of Session Nurse Communication: Mobility status  Activity Tolerance: Patient tolerated treatment well Patient left: in bed;with call bell/phone within reach;with bed alarm set  OT Visit Diagnosis: Unsteadiness on feet (R26.81);Other abnormalities of gait and mobility (R26.89);Muscle weakness (generalized) (M62.81);Pain                Time: 1443-1540 OT Time Calculation (min): 17 min Charges:  OT General Charges $OT Visit: 1  Visit OT Evaluation $OT Eval Moderate Complexity: 1 Mod   Tiny Chaudhary A Kagan Hietpas 03/20/2021, 4:47 PM

## 2021-03-20 NOTE — Evaluation (Signed)
Physical Therapy Evaluation Patient Details Name: [name] MRN: [id number] DOB: 22-Jun-1940 Today's Date: 03/20/2021  History of Present Illness  Pt is an 80 y/o female admitted 10/21 secondary to hypoglycemia and chills/cough/diarrhea. Found to be COVID+. PMH includes CVA with L weakness, CAD s/p CABG, HTN, DM, and CKD.  Clinical Impression  Pt admitted secondary to problem above with deficits below. Pt with decreased safety awareness and slightly impulsive. Practiced gait with and without AD. Pt with notable unsteadiness without AD and required min A for steadying. Pt from SNF and has been working with therapies. Recommend return at d/c to continue increasing independence and safety.  Will continue to follow acutely.      Recommendations for follow up therapy are one component of a multi-disciplinary discharge planning process, led by the attending physician.  Recommendations may be updated based on patient status, additional functional criteria and insurance authorization.  Follow Up Recommendations SNF    Equipment Recommendations  None recommended by PT    Recommendations for Other Services       Precautions / Restrictions Precautions Precautions: Fall Restrictions Weight Bearing Restrictions: No      Mobility  Bed Mobility Overal bed mobility: Needs Assistance Bed Mobility: Supine to Sit;Sit to Supine     Supine to sit: Supervision Sit to supine: Supervision   General bed mobility comments: Increased time required.    Transfers Overall transfer level: Needs assistance Equipment used: Rolling walker (2 wheeled);None Transfers: Sit to/from Stand Sit to Stand: Min guard         General transfer comment: Min guard A for safety. Cues for safe hand placement when using RW. Attempted with and without AD and notable unsteadiness without AD.  Ambulation/Gait Ambulation/Gait assistance: Min guard;Min assist Gait Distance (Feet): 10 Feet (X2) Assistive device:  Rolling walker (2 wheeled);None Gait Pattern/deviations: Step-through pattern;Decreased stride length Gait velocity: Decreased   General Gait Details: Ambulated to chair with min guard A and use of RW. Once in chair, vascular present for testing, so returned to bed. Ambulated short distance back to bed without AD. Pt with more notable unsteadiness requiring min A. Poor safety awareness and not waiting for PT to disconnect lines for ease of mobility.  Stairs            Wheelchair Mobility    Modified Rankin (Stroke Patients Only)       Balance Overall balance assessment: Needs assistance Sitting-balance support: No upper extremity supported;Feet supported Sitting balance-Leahy Scale: Fair     Standing balance support: Bilateral upper extremity supported;During functional activity Standing balance-Leahy Scale: Poor Standing balance comment: Reliant on BUE support                             Pertinent Vitals/Pain Pain Assessment: Faces Faces Pain Scale: No hurt    Home Living Family/patient expects to be discharged to:: Skilled nursing facility                      Prior Function Level of Independence: Independent with assistive device(s)         Comments: Pt reports independence with use of rollator. Reports nursing staff does not have to help her ambulate or with ADLs. Unsure of accuracy given cognitive deficits.     Hand Dominance        Extremity/Trunk Assessment   Upper Extremity Assessment Upper Extremity Assessment: Defer to OT evaluation    Lower Extremity Assessment  Lower Extremity Assessment: Generalized weakness;LLE deficits/detail LLE Deficits / Details: LLE weakness at baseline    Cervical / Trunk Assessment Cervical / Trunk Assessment: Kyphotic  Communication   Communication: No difficulties  Cognition Arousal/Alertness: Awake/alert Behavior During Therapy: Impulsive Overall Cognitive Status: No family/caregiver  present to determine baseline cognitive functioning                                 General Comments: Slightly impulsive. Referencing hospital as if she were still at SNF. Required safety cues throughout mobility.      General Comments General comments (skin integrity, edema, etc.): No family present    Exercises     Assessment/Plan    PT Assessment Patient needs continued PT services  PT Problem List Decreased strength;Decreased activity tolerance;Decreased balance;Decreased mobility;Decreased cognition;Decreased knowledge of use of DME;Decreased safety awareness;Decreased knowledge of precautions       PT Treatment Interventions DME instruction;Gait training;Functional mobility training;Therapeutic activities;Therapeutic exercise;Balance training;Cognitive remediation;Patient/family education    PT Goals (Current goals can be found in the Care Plan section)  Acute Rehab PT Goals Patient Stated Goal: to get up to the chair PT Goal Formulation: With patient Time For Goal Achievement: 04/03/21 Potential to Achieve Goals: Good    Frequency Min 2X/week   Barriers to discharge        Co-evaluation               AM-PAC PT "6 Clicks" Mobility  Outcome Measure Help needed turning from your back to your side while in a flat bed without using bedrails?: A Little Help needed moving from lying on your back to sitting on the side of a flat bed without using bedrails?: A Little Help needed moving to and from a bed to a chair (including a wheelchair)?: A Little Help needed standing up from a chair using your arms (e.g., wheelchair or bedside chair)?: A Little Help needed to walk in hospital room?: A Little Help needed climbing 3-5 steps with a railing? : Total 6 Click Score: 16    End of Session Equipment Utilized During Treatment: Gait belt Activity Tolerance: Patient tolerated treatment well Patient left: in bed;with call bell/phone within reach;with bed alarm  set;Other (comment) (vascular staff present) Nurse Communication: Mobility status PT Visit Diagnosis: Unsteadiness on feet (R26.81);Muscle weakness (generalized) (M62.81);Difficulty in walking, not elsewhere classified (R26.2)    Time: 6333-5456 PT Time Calculation (min) (ACUTE ONLY): 24 min   Charges:   PT Evaluation $PT Eval Moderate Complexity: 1 Mod PT Treatments $Therapeutic Activity: 8-22 mins        Lou Miner, DPT  Acute Rehabilitation Services  Pager: [phone] Office: [phone]   Rudean Hitt 03/20/2021, 12:36 PM

## 2021-03-21 ENCOUNTER — Inpatient Hospital Stay (HOSPITAL_COMMUNITY): Payer: Medicare Other

## 2021-03-21 LAB — CBC WITH DIFFERENTIAL/PLATELET
Abs Immature Granulocytes: 0.03 10*3/uL (ref 0.00–0.07)
Basophils Absolute: 0 10*3/uL (ref 0.0–0.1)
Basophils Relative: 1 %
Eosinophils Absolute: 0.2 10*3/uL (ref 0.0–0.5)
Eosinophils Relative: 3 %
HCT: 30.7 % — ABNORMAL LOW (ref 36.0–46.0)
Hemoglobin: 9.8 g/dL — ABNORMAL LOW (ref 12.0–15.0)
Immature Granulocytes: 1 %
Lymphocytes Relative: 41 %
Lymphs Abs: 2.4 10*3/uL (ref 0.7–4.0)
MCH: 28.7 pg (ref 26.0–34.0)
MCHC: 31.9 g/dL (ref 30.0–36.0)
MCV: 90 fL (ref 80.0–100.0)
Monocytes Absolute: 0.4 10*3/uL (ref 0.1–1.0)
Monocytes Relative: 7 %
Neutro Abs: 2.7 10*3/uL (ref 1.7–7.7)
Neutrophils Relative %: 47 %
Platelets: 155 10*3/uL (ref 150–400)
RBC: 3.41 MIL/uL — ABNORMAL LOW (ref 3.87–5.11)
RDW: 15.9 % — ABNORMAL HIGH (ref 11.5–15.5)
WBC: 5.7 10*3/uL (ref 4.0–10.5)
nRBC: 0.7 % — ABNORMAL HIGH (ref 0.0–0.2)

## 2021-03-21 LAB — COMPREHENSIVE METABOLIC PANEL
ALT: 11 U/L (ref 0–44)
AST: 15 U/L (ref 15–41)
Albumin: 3 g/dL — ABNORMAL LOW (ref 3.5–5.0)
Alkaline Phosphatase: 48 U/L (ref 38–126)
Anion gap: 8 (ref 5–15)
BUN: 36 mg/dL — ABNORMAL HIGH (ref 8–23)
CO2: 18 mmol/L — ABNORMAL LOW (ref 22–32)
Calcium: 8.7 mg/dL — ABNORMAL LOW (ref 8.9–10.3)
Chloride: 111 mmol/L (ref 98–111)
Creatinine, Ser: 3.81 mg/dL — ABNORMAL HIGH (ref 0.44–1.00)
GFR, Estimated: 11 mL/min — ABNORMAL LOW (ref >60)
Glucose, Bld: 86 mg/dL (ref 70–99)
Potassium: 4.7 mmol/L (ref 3.5–5.1)
Sodium: 137 mmol/L (ref 135–145)
Total Bilirubin: 0.9 mg/dL (ref 0.3–1.2)
Total Protein: 6.6 g/dL (ref 6.5–8.1)

## 2021-03-21 LAB — MAGNESIUM: Magnesium: 2 mg/dL (ref 1.7–2.4)

## 2021-03-21 LAB — D-DIMER, QUANTITATIVE: D-Dimer, Quant: 3.51 ug/mL-FEU — ABNORMAL HIGH (ref 0.00–0.50)

## 2021-03-21 LAB — GLUCOSE, CAPILLARY
Glucose-Capillary: 72 mg/dL (ref 70–99)
Glucose-Capillary: 72 mg/dL (ref 70–99)
Glucose-Capillary: 104 mg/dL — ABNORMAL HIGH (ref 70–99)
Glucose-Capillary: 83 mg/dL (ref 70–99)
Glucose-Capillary: 233 mg/dL — ABNORMAL HIGH (ref 70–99)
Glucose-Capillary: 184 mg/dL — ABNORMAL HIGH (ref 70–99)
Glucose-Capillary: 141 mg/dL — ABNORMAL HIGH (ref 70–99)

## 2021-03-21 LAB — BRAIN NATRIURETIC PEPTIDE: B Natriuretic Peptide: 378.6 pg/mL — ABNORMAL HIGH (ref 0.0–100.0)

## 2021-03-21 LAB — C-REACTIVE PROTEIN: CRP: 2.3 mg/dL — ABNORMAL HIGH (ref <1.0)

## 2021-03-21 MED ORDER — SODIUM ZIRCONIUM CYCLOSILICATE 10 G PO PACK
10.0000 g | PACK | Freq: Three times a day (TID) | ORAL | Status: AC
  Administered 2021-03-21 (×2): 10 g via ORAL
  Filled 2021-03-21 (×2): qty 1

## 2021-03-21 MED ORDER — HEPARIN SODIUM (PORCINE) 5000 UNIT/ML IJ SOLN
5000.0000 [IU] | Freq: Three times a day (TID) | INTRAMUSCULAR | Status: DC
  Administered 2021-03-21 – 2021-03-22 (×3): 5000 [IU] via SUBCUTANEOUS
  Filled 2021-03-21 (×3): qty 1

## 2021-03-21 MED ORDER — HYDRALAZINE HCL 50 MG PO TABS
50.0000 mg | ORAL_TABLET | Freq: Three times a day (TID) | ORAL | Status: DC
  Administered 2021-03-21 – 2021-03-22 (×4): 50 mg via ORAL
  Filled 2021-03-21 (×4): qty 1

## 2021-03-21 NOTE — TOC Initial Note (Signed)
Transition of Care [facility]) - Initial/Assessment Note    Patient Details  Name: [name] MRN: [id number] Date of Birth: 09/24/1940  Transition of Care [facility]) CM/SW Contact:    Benard Halsted, LCSW Phone Number: 03/21/2021, 12:19 PM  Clinical Narrative:                 CSW spoke with patient's daughter regarding potential discharge back to [place] tomorrow. Patient has resided under long term care services there since transitioning from rehab at [facility]. [facility] aware of plan as well. CSW scheduled PTAR for transport at 11am.   Expected Discharge Plan: [place] Barriers to Discharge: Continued Medical Work up   Patient Goals and CMS Choice Patient states their goals for this hospitalization and ongoing recovery are:: Return to snf CMS Medicare.gov Compare Post Acute Care list provided to:: Patient Represenative (must comment) (Daughter) Choice offered to / list presented to : Adult Children  Expected Discharge Plan and Services Expected Discharge Plan: [place] In-house Referral: Clinical Social Work   Post Acute Care Choice: [place] Living arrangements for the past 2 months: [place]                                      Prior Living Arrangements/Services Living arrangements for the past 2 months: [place] Lives with:: Facility Resident Patient language and need for interpreter reviewed:: Yes Do you feel safe going back to the place where you live?: Yes      Need for Family Participation in Patient Care: Yes (Comment) Care giver support system in place?: Yes (comment)   Criminal Activity/Legal Involvement Pertinent to Current Situation/Hospitalization: No - Comment as needed  Activities of Daily Living Home Assistive Devices/Equipment: Gilford Rile (specify type) ADL Screening (condition at time of admission) Patient's cognitive ability adequate to safely complete daily  activities?: Yes Is the patient deaf or have difficulty hearing?: No Does the patient have difficulty seeing, even when wearing glasses/contacts?: No Does the patient have difficulty concentrating, remembering, or making decisions?: No Patient able to express need for assistance with ADLs?: Yes Does the patient have difficulty dressing or bathing?: No Independently performs ADLs?: Yes (appropriate for developmental age) Does the patient have difficulty walking or climbing stairs?: Yes Weakness of Legs: Both Weakness of Arms/Hands: Both  Permission Sought/Granted Permission sought to share information with : Family Supports, Chartered certified accountant granted to share information with : Yes, Verbal Permission Granted  Share Information with NAME: [name]  Permission granted to share info w AGENCY: [facility]  Permission granted to share info w Relationship: Daughter  Permission granted to share info w Contact Information: [phone]  Emotional Assessment   Attitude/Demeanor/Rapport: Unable to Assess Affect (typically observed): Unable to Assess Orientation: : Oriented to Self, Oriented to Place Alcohol / Substance Use: Not Applicable Psych Involvement: No (comment)  Admission diagnosis:  Hypoglycemia [E16.2] AKI (acute kidney injury) ([place]) [N17.9] COVID [U07.1] Patient Active Problem List   Diagnosis Date Noted   COVID 03/18/2021   Hypoglycemia    Depression, major, single episode, moderate ([place]) 03/09/2021   AKI (acute kidney injury) ([place]) 03/03/2021   Acute blood loss anemia 01/28/2021   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   NSTEMI (non-ST elevated myocardial infarction) ([place]) 01/10/2021   CAD (coronary artery disease) 01/10/2021   CVA (cerebral vascular accident) ([place]) 10/11/2019   Hypertensive urgency 04/07/2019   CKD (chronic  kidney disease) stage 3, GFR 30-59 ml/min (HCC) 04/07/2019   Chest pain 04/28/2017   History of ST elevation myocardial infarction  (STEMI) 04/28/2017   Grief reaction 04/28/2017   Osteoarthritis of left hip 05/18/2015   Status post total replacement of left hip 05/18/2015   Coronary artery disease involving native coronary artery of native heart with angina pectoris ([place]) 04/16/2015   History of stroke 04/16/2015   Type 2 diabetes mellitus with circulatory disorder ([place]) 04/16/2015   Cerebrovascular 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 artery ([place]) 04/16/2015   Acute CVA (cerebrovascular accident) ([place]) 02/19/2015   Transient ischemic attack (TIA) 02/18/2015   Stroke ([place])    Cerebral infarction due to thrombosis of right middle cerebral artery (HCC)    Acute myocardial infarction of inferolateral wall ([place]) 04/12/2014   Numbness 03/18/2014   TIA (transient ischemic attack) 03/18/2014   DM (diabetes mellitus), secondary, with peripheral vascular complications ([place]) 44/05/270   Hyperlipidemia    Hypertension    PVD (peripheral vascular disease) ([place]) 03/05/2014   Transient cerebral ischemia 08/01/2012   Atherosclerosis of native arteries of extremity with intermittent claudication ([place]) 10/19/2011   Unspecified disorders of arteries and arterioles 10/19/2011   PAD (peripheral artery disease) ([place]) 04/13/2011   PCP:  [name], MD Pharmacy:   [facility] [place], [address] AT [address] [place] [address] Phone: [phone] Fax: [phone]  [place], [place] - [address] [place] [address] Phone: [phone] Fax: [phone]     Social Determinants of Health (SDOH) Interventions    Readmission Risk Interventions No flowsheet data found.

## 2021-03-21 NOTE — NC FL2 (Signed)
[place] MEDICAID FL2 LEVEL OF CARE SCREENING TOOL     IDENTIFICATION  Patient Name: [name] Birthdate: March 30, 1941 Sex: female Admission Date (Current Location): 03/18/2021  [facility] and [place] Number:  Herbalist and Address:  [facility], [place] [address], [place],  [address]      Provider Number: [id number]  Attending Physician Name and Address:  Thurnell Lose, MD  Relative Name and Phone Number:       Current Level of Care: Hospital Recommended Level of Care: [place] Prior Approval Number:    Date Approved/Denied:   PASRR Number: [id number]  Discharge Plan: SNF    Current Diagnoses: Patient Active Problem List   Diagnosis Date Noted   COVID 03/18/2021   Hypoglycemia    Depression, major, single episode, moderate ([place]) 03/09/2021   AKI (acute kidney injury) ([place]) 03/03/2021   Acute blood loss anemia 01/28/2021   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   NSTEMI (non-ST elevated myocardial infarction) ([place]) 01/10/2021   CAD (coronary artery disease) 01/10/2021   CVA (cerebral vascular accident) ([place]) 10/11/2019   Hypertensive urgency 04/07/2019   CKD (chronic kidney disease) stage 3, GFR 30-59 ml/min (HCC) 04/07/2019   Chest pain 04/28/2017   History of ST elevation myocardial infarction (STEMI) 04/28/2017   Grief reaction 04/28/2017   Osteoarthritis of left hip 05/18/2015   Status post total replacement of left hip 05/18/2015   Coronary artery disease involving native coronary artery of native heart with angina pectoris ([place]) 04/16/2015   History of stroke 04/16/2015   Type 2 diabetes mellitus with circulatory disorder ([place]) 04/16/2015   Cerebrovascular 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 artery ([place]) 04/16/2015   Acute CVA (cerebrovascular accident) ([place]) 02/19/2015   Transient ischemic attack (TIA) 02/18/2015   Stroke [facility])    Cerebral infarction due to thrombosis of right middle  cerebral artery (HCC)    Acute myocardial infarction of inferolateral wall ([place]) 04/12/2014   Numbness 03/18/2014   TIA (transient ischemic attack) 03/18/2014   DM (diabetes mellitus), secondary, with peripheral vascular complications ([place]) 52/77/8242   Hyperlipidemia    Hypertension    PVD (peripheral vascular disease) ([place]) 03/05/2014   Transient cerebral ischemia 08/01/2012   Atherosclerosis of native arteries of extremity with intermittent claudication ([place]) 10/19/2011   Unspecified disorders of arteries and arterioles 10/19/2011   PAD (peripheral artery disease) ([place]) 04/13/2011    Orientation RESPIRATION BLADDER Height & Weight     Self, Place  Normal Continent Weight: 157 lb (71.2 kg) Height:  5\' 7"  (170.2 cm)  BEHAVIORAL SYMPTOMS/MOOD NEUROLOGICAL BOWEL NUTRITION STATUS      Continent Diet (see dc summary)  AMBULATORY STATUS COMMUNICATION OF NEEDS Skin   Limited Assist Verbally Normal                       Personal Care Assistance Level of Assistance  Bathing, Feeding, Dressing Bathing Assistance: Limited assistance Feeding assistance: Independent Dressing Assistance: Limited assistance     Functional Limitations Info  Sight Sight Info: Impaired        SPECIAL CARE FACTORS FREQUENCY                       Contractures Contractures Info: Not present    Additional Factors Info  Code Status, Allergies, Isolation Precautions, Psychotropic Code Status Info: Full Allergies Info: Keflex (Cephalexin), Lipitor (Atorvastatin), Codeine, Hydrochlorothiazide, Hydrocodone-acetaminophen, Metformin Hcl, Tramadol Hcl, Azor (Amlodipine-olmesartan), Lisinopril,  Penicillins Psychotropic Info: Buspar   Isolation Precautions Info: COVID+ 03/18/21     Current Medications (03/21/2021):  This is the current hospital active medication list Current Facility-Administered Medications  Medication Dose Route Frequency Provider Last Rate Last Admin   acetaminophen (TYLENOL)  tablet 650 mg  650 mg Oral QHS PRN Wynetta Fines T, MD   650 mg at 03/19/21 1042   albuterol (VENTOLIN HFA) 108 (90 Base) MCG/ACT inhaler 2 puff  2 puff Inhalation Q4H PRN Wynetta Fines T, MD       albuterol (VENTOLIN HFA) 108 (90 Base) MCG/ACT inhaler 2 puff  2 puff Inhalation Q6H Wynetta Fines T, MD   2 puff at 03/21/21 0149   amLODipine (NORVASC) tablet 10 mg  10 mg Oral Daily Wynetta Fines T, MD   10 mg at 03/21/21 0938   aspirin EC tablet 81 mg  81 mg Oral Daily Wynetta Fines T, MD   81 mg at 03/21/21 0817   busPIRone (BUSPAR) tablet 5 mg  5 mg Oral BID Wynetta Fines T, MD   5 mg at 03/21/21 0816   carvedilol (COREG) tablet 6.25 mg  6.25 mg Oral BID WC Jonetta Osgood, MD   6.25 mg at 03/21/21 0817   clopidogrel (PLAVIX) tablet 75 mg  75 mg Oral Daily Wynetta Fines T, MD   75 mg at 03/21/21 0817   famotidine (PEPCID) tablet 10 mg  10 mg Oral BID Wynetta Fines T, MD   10 mg at 03/21/21 0816   feeding supplement (ENSURE ENLIVE / ENSURE PLUS) liquid 237 mL  237 mL Oral TID BM Jonetta Osgood, MD   237 mL at 03/21/21 0839   guaiFENesin-dextromethorphan (ROBITUSSIN DM) 100-10 MG/5ML syrup 10 mL  10 mL Oral Q4H PRN Wynetta Fines T, MD   10 mL at 03/18/21 1606   heparin injection 5,000 Units  5,000 Units Subcutaneous Q8H Thurnell Lose, MD       hydrALAZINE (APRESOLINE) injection 10 mg  10 mg Intravenous Q6H PRN Ghimire, Henreitta Leber, MD       hydrALAZINE (APRESOLINE) tablet 50 mg  50 mg Oral Q8H Thurnell Lose, MD   50 mg at 03/21/21 1829   isosorbide mononitrate (IMDUR) 24 hr tablet 60 mg  60 mg Oral Daily Thurnell Lose, MD   60 mg at 03/21/21 9371   multivitamin with minerals tablet 1 tablet  1 tablet Oral Daily Jonetta Osgood, MD   1 tablet at 03/21/21 0816   ondansetron (ZOFRAN) injection 4 mg  4 mg Intravenous Q6H PRN Wynetta Fines T, MD   4 mg at 03/19/21 0809   pantoprazole (PROTONIX) EC tablet 40 mg  40 mg Oral Daily Wynetta Fines T, MD   40 mg at 03/21/21 6967   remdesivir 200 mg in sodium  chloride 0.9% 250 mL IVPB  200 mg Intravenous Once Wynetta Fines T, MD       rosuvastatin (CRESTOR) tablet 10 mg  10 mg Oral QHS Wynetta Fines T, MD   10 mg at 03/20/21 2227   sodium zirconium cyclosilicate (LOKELMA) packet 10 g  10 g Oral TID Thurnell Lose, MD   10 g at 03/21/21 8938     Discharge Medications: Please see discharge summary for a list of discharge medications.  Relevant Imaging Results:  Relevant Lab Results:   Additional Information [id number]  Benard Halsted, LCSW

## 2021-03-21 NOTE — Progress Notes (Addendum)
PROGRESS NOTE        PATIENT DETAILS Name: [name] Age: 80 y.o. Sex: female Date of Birth: August 13, 1940 Admit Date: 03/18/2021 Admitting Physician Lequita Halt, MD OTL:[id number], Ivin Booty, MD  Brief Narrative: Patient is a 80 y.o. female with history of recent CABG (August 2022), HTN, DM-2, CKD stage IIIb, HLD-presented from SNF for weakness, hypoglycemia, diarrhea.  She was found to have COVID-19 infection.   Pertinent Labs/Radiology: WBC: 5.9 Hb: 10.6 Na: 137 K: 4.8 Creatinine: 3.97  10/21>>CXR: No pneumonia 10/21>> CT head: No acute intracranial abnormality. 10/21>> renal ultrasound: No hydronephrosis.   Subjective:  Patient in bed, appears comfortable, denies any headache, no fever, no chest pain or pressure, no shortness of breath , no abdominal pain. No new focal weakness.    Objective: Vitals: Blood pressure (!) 157/98, pulse 89, temperature 99.1 F (37.3 C), temperature source Oral, resp. rate 15, height 5\' 7"  (1.702 m), weight 71.2 kg, SpO2 98 %.   Exam:  Awake Alert, No new F.N deficits, Normal affect [place].AT,PERRAL Supple Neck, No JVD,   Symmetrical Chest wall movement, Good air movement bilaterally, CTAB RRR,No Gallops, Rubs or new Murmurs,  +ve B.Sounds, Abd Soft, No tenderness,   No Cyanosis, Clubbing or edema,       Assessment/Plan:  AKI on CKD stage IIIb: Likely hemodynamically mediated-improving with supportive care-continue IV fluids x 15 hrs on 03/20/21 - avoid nephrotoxic agents and continue with supportive care. Hyperkalemia Improved post Lokelma.  Hypoglycemia: Resolved-probably due to use of Amaryl and a setting of worsening renal function.  Encourage oral intake-decrease rate of IVF and stop later today.  Acute metabolic encephalopathy: Due to combination of hypoglycemia and AKI-resolved-CT head negative for structural abnormalities-she is awake/alert and answering all questions appropriately.  COVID-19 infection:  Not hypoxic-no PNA on CXR-continue Remdesivir-we will plan on 3 days treatment.  Diarrhea: Probably due to COVID-19 infection-if she has diarrheas-reasonable to perform C. difficile/GI pathogen panel to rule out alternative organisms.  Elevated D-dimer: Probably due to a combination of COVID-19 infection-recent CABG-she is not hypoxic-Leg [place] -ve.  Remains on prophylactic heparin with improving trend.  CAD s/p CABG August 2021: No anginal symptoms  HTN: BP remains elevated-increase Coreg to 6.25 mg twice daily, continue current dosing of amlodipine, added hydralazine, Imdur-follow and adjust.  HLD: Continue statin  Dysphagia: Appears to both solids/liquids-we will await SLP evaluation-we will order a barium esophagogram.  Unintentional weight loss: TSH within normal limits-suspect could have been from recent hospitalization-we will consult nutrition services-maximize supplements-and defer further work-up to the outpatient setting.  DM-2 (A1c 9.1 on 8/22): Monitor of Meds  CBG (last 3)  Recent Labs    03/21/21 0328 03/21/21 0527 03/21/21 0805  GLUCAP 72 104* 83    Lab Results  Component Value Date   HGBA1C 9.1 (H) 01/17/2021    Procedures: None Consults: None DVT Prophylaxis: SQ Heparin Code Status:Full code  Family Communication: None at bedside  Time spent: 35 minutes-Greater than 50% of this time was spent in counseling, explanation of diagnosis, planning of further management, and coordination of care.  Diet: Diet Order             Diet regular Room service appropriate? Yes; Fluid consistency: Thin  Diet effective now  Disposition Plan: Status is: Inpatient  Remains inpatient appropriate because: Severity of illness-needing IV treatment.   Barriers to Discharge: Improving AKI-resolving hypoglycemia-dysphagia-COVID-19 infection times Remdesivir  Antimicrobial agents: Anti-infectives (From admission, onward)    Start     Dose/Rate  Route Frequency Ordered Stop   03/19/21 1000  remdesivir 100 mg in sodium chloride 0.9 % 100 mL IVPB       See Hyperspace for full Linked Orders Report.   100 mg 200 mL/hr over 30 Minutes Intravenous Daily 03/18/21 1500 03/21/21 0900   03/18/21 1515  remdesivir 200 mg in sodium chloride 0.9% 250 mL IVPB       See Hyperspace for full Linked Orders Report.   200 mg 580 mL/hr over 30 Minutes Intravenous Once 03/18/21 1500          MEDICATIONS: Scheduled Meds:  albuterol  2 puff Inhalation Q6H   amLODipine  10 mg Oral Daily   aspirin EC  81 mg Oral Daily   busPIRone  5 mg Oral BID   carvedilol  6.25 mg Oral BID WC   clopidogrel  75 mg Oral Daily   famotidine  10 mg Oral BID   feeding supplement  237 mL Oral TID BM   heparin  5,000 Units Subcutaneous Q8H   hydrALAZINE  50 mg Oral Q8H   isosorbide mononitrate  60 mg Oral Daily   multivitamin with minerals  1 tablet Oral Daily   pantoprazole  40 mg Oral Daily   rosuvastatin  10 mg Oral QHS   sodium zirconium cyclosilicate  10 g Oral TID   Continuous Infusions:  remdesivir 200 mg in sodium chloride 0.9% 250 mL IVPB     PRN Meds:.acetaminophen, albuterol, guaiFENesin-dextromethorphan, hydrALAZINE, [DISCONTINUED] ondansetron **OR** ondansetron (ZOFRAN) IV   I have personally reviewed following labs and imaging studies  LABORATORY DATA:  Recent Labs  Lab 03/18/21 1145 03/19/21 0406 03/20/21 0134 03/21/21 0109  WBC 5.2 5.9 6.5 5.7  HGB 10.4* 10.6* 10.1* 9.8*  HCT 32.9* 32.6* 30.8* 30.7*  PLT 151 165 151 155  MCV 90.1 88.6 88.0 90.0  MCH 28.5 28.8 28.9 28.7  MCHC 31.6 32.5 32.8 31.9  RDW 15.8* 15.7* 15.8* 15.9*  LYMPHSABS 1.0  --   --  2.4  MONOABS 0.5  --   --  0.4  EOSABS 0.0  --   --  0.2  BASOSABS 0.0  --   --  0.0    Recent Labs  Lab 03/18/21 1145 03/18/21 1314 03/18/21 1500 03/19/21 0406 03/20/21 0134 03/21/21 0109  NA 135  --   --  137 134* 137  K 4.3  --   --  4.8 5.5* 4.7  CL 107  --   --  112* 110  111  CO2 19*  --   --  18* 18* 18*  GLUCOSE 107*  --   --  125* 116* 86  BUN 45*  --   --  38* 34* 36*  CREATININE 4.84*  --   --  3.97* 4.00* 3.81*  CALCIUM 8.7*  --   --  8.6* 8.8* 8.7*  AST 20  --   --   --  18 15  ALT 12  --   --   --  14 11  ALKPHOS 52  --   --   --  52 48  BILITOT 0.5  --   --   --  0.6 0.9  ALBUMIN 3.4*  --   --   --  3.1* 3.0*  MG  --   --   --   --   --  2.0  CRP  --   --  1.2* 1.2*  --  2.3*  DDIMER  --   --  5.84* 5.62* 4.37* 3.51*  PROCALCITON  --   --  <0.10  --   --   --   LATICACIDVEN 1.0 2.1*  --   --   --   --   TSH 0.745  --   --   --   --   --   AMMONIA 15  --   --   --   --   --   BNP  --   --   --   --   --  378.6*          RADIOLOGY STUDIES/RESULTS: VAS [place] LOWER EXTREMITY VENOUS (DVT)  Result Date: 03/20/2021  Lower Venous DVT Study Patient Name:  [name]  Date of Exam:   03/20/2021 Medical Rec #: [id number]       Accession #:    [id number] Date of Birth: Aug 30, 1940        Patient Gender: F Patient Age:   48 years Exam Location:  [facility] Procedure:      VAS [place] LOWER EXTREMITY VENOUS (DVT) Referring Phys: Oren Binet --------------------------------------------------------------------------------  Indications: Elevated Ddimer.  Risk Factors: COVID 19 positive. Comparison Study: No prior studies. Performing Technologist: Oliver Hum RVT  Examination Guidelines: A complete evaluation includes B-mode imaging, spectral Doppler, color Doppler, and power Doppler as needed of all accessible portions of each vessel. Bilateral testing is considered an integral part of a complete examination. Limited examinations for reoccurring indications may be performed as noted. The reflux portion of the exam is performed with the patient in reverse Trendelenburg.  +---------+---------------+---------+-----------+----------+--------------+ RIGHT    CompressibilityPhasicitySpontaneityPropertiesThrombus Aging  +---------+---------------+---------+-----------+----------+--------------+ CFV      Full           Yes      Yes                                 +---------+---------------+---------+-----------+----------+--------------+ SFJ      Full                                                        +---------+---------------+---------+-----------+----------+--------------+ FV Prox  Full                                                        +---------+---------------+---------+-----------+----------+--------------+ FV Mid   Full                                                        +---------+---------------+---------+-----------+----------+--------------+ FV DistalFull                                                        +---------+---------------+---------+-----------+----------+--------------+  PFV      Full                                                        +---------+---------------+---------+-----------+----------+--------------+ POP      Full           Yes      Yes                                 +---------+---------------+---------+-----------+----------+--------------+ PTV      Full                                                        +---------+---------------+---------+-----------+----------+--------------+ PERO     Full                                                        +---------+---------------+---------+-----------+----------+--------------+   +---------+---------------+---------+-----------+----------+--------------+ LEFT     CompressibilityPhasicitySpontaneityPropertiesThrombus Aging +---------+---------------+---------+-----------+----------+--------------+ CFV      Full           Yes      Yes                                 +---------+---------------+---------+-----------+----------+--------------+ SFJ      Full                                                         +---------+---------------+---------+-----------+----------+--------------+ FV Prox  Full                                                        +---------+---------------+---------+-----------+----------+--------------+ FV Mid   Full                                                        +---------+---------------+---------+-----------+----------+--------------+ FV DistalFull                                                        +---------+---------------+---------+-----------+----------+--------------+ PFV      Full                                                        +---------+---------------+---------+-----------+----------+--------------+  POP      Full           Yes      Yes                                 +---------+---------------+---------+-----------+----------+--------------+ PTV      Full                                                        +---------+---------------+---------+-----------+----------+--------------+ PERO     Full                                                        +---------+---------------+---------+-----------+----------+--------------+     Summary: RIGHT: - There is no evidence of deep vein thrombosis in the lower extremity.  - No cystic structure found in the popliteal fossa.  LEFT: - There is no evidence of deep vein thrombosis in the lower extremity.  - No cystic structure found in the popliteal fossa.  *See table(s) above for measurements and observations. Electronically signed by Deitra Mayo MD on 03/20/2021 at 2:39:06 PM.    Final      LOS: 3 days   Signature  Lala Lund M.D on 03/21/2021 at 11:10 AM   -  To page go to [email or web address]

## 2021-03-22 DIAGNOSIS — I152 Hypertension secondary to endocrine disorders: Secondary | ICD-10-CM | POA: Diagnosis not present

## 2021-03-22 DIAGNOSIS — G459 Transient cerebral ischemic attack, unspecified: Secondary | ICD-10-CM | POA: Diagnosis not present

## 2021-03-22 DIAGNOSIS — N1832 Chronic kidney disease, stage 3b: Secondary | ICD-10-CM | POA: Diagnosis not present

## 2021-03-22 DIAGNOSIS — E1159 Type 2 diabetes mellitus with other circulatory complications: Secondary | ICD-10-CM | POA: Diagnosis not present

## 2021-03-22 DIAGNOSIS — Z96642 Presence of left artificial hip joint: Secondary | ICD-10-CM | POA: Diagnosis not present

## 2021-03-22 DIAGNOSIS — I739 Peripheral vascular disease, unspecified: Secondary | ICD-10-CM | POA: Diagnosis not present

## 2021-03-22 DIAGNOSIS — I2119 ST elevation (STEMI) myocardial infarction involving other coronary artery of inferior wall: Secondary | ICD-10-CM | POA: Diagnosis not present

## 2021-03-22 DIAGNOSIS — N179 Acute kidney failure, unspecified: Secondary | ICD-10-CM | POA: Diagnosis not present

## 2021-03-22 DIAGNOSIS — I25110 Atherosclerotic heart disease of native coronary artery with unstable angina pectoris: Secondary | ICD-10-CM | POA: Diagnosis not present

## 2021-03-22 DIAGNOSIS — U071 COVID-19: Secondary | ICD-10-CM | POA: Diagnosis not present

## 2021-03-22 DIAGNOSIS — Z743 Need for continuous supervision: Secondary | ICD-10-CM | POA: Diagnosis not present

## 2021-03-22 DIAGNOSIS — I70219 Atherosclerosis of native arteries of extremities with intermittent claudication, unspecified extremity: Secondary | ICD-10-CM | POA: Diagnosis not present

## 2021-03-22 DIAGNOSIS — G9340 Encephalopathy, unspecified: Secondary | ICD-10-CM | POA: Diagnosis not present

## 2021-03-22 DIAGNOSIS — J9600 Acute respiratory failure, unspecified whether with hypoxia or hypercapnia: Secondary | ICD-10-CM | POA: Diagnosis not present

## 2021-03-22 DIAGNOSIS — I10 Essential (primary) hypertension: Secondary | ICD-10-CM | POA: Diagnosis not present

## 2021-03-22 DIAGNOSIS — N183 Chronic kidney disease, stage 3 unspecified: Secondary | ICD-10-CM | POA: Diagnosis not present

## 2021-03-22 DIAGNOSIS — R531 Weakness: Secondary | ICD-10-CM | POA: Diagnosis not present

## 2021-03-22 DIAGNOSIS — J90 Pleural effusion, not elsewhere classified: Secondary | ICD-10-CM | POA: Diagnosis not present

## 2021-03-22 DIAGNOSIS — Z951 Presence of aortocoronary bypass graft: Secondary | ICD-10-CM | POA: Diagnosis not present

## 2021-03-22 DIAGNOSIS — E785 Hyperlipidemia, unspecified: Secondary | ICD-10-CM | POA: Diagnosis not present

## 2021-03-22 DIAGNOSIS — E162 Hypoglycemia, unspecified: Secondary | ICD-10-CM | POA: Diagnosis not present

## 2021-03-22 DIAGNOSIS — I2510 Atherosclerotic heart disease of native coronary artery without angina pectoris: Secondary | ICD-10-CM | POA: Diagnosis not present

## 2021-03-22 DIAGNOSIS — I63311 Cerebral infarction due to thrombosis of right middle cerebral artery: Secondary | ICD-10-CM | POA: Diagnosis not present

## 2021-03-22 DIAGNOSIS — E161 Other hypoglycemia: Secondary | ICD-10-CM | POA: Diagnosis not present

## 2021-03-22 LAB — CBC WITH DIFFERENTIAL/PLATELET
Abs Immature Granulocytes: 0.03 10*3/uL (ref 0.00–0.07)
Basophils Absolute: 0 10*3/uL (ref 0.0–0.1)
Basophils Relative: 1 %
Eosinophils Absolute: 0.2 10*3/uL (ref 0.0–0.5)
Eosinophils Relative: 3 %
HCT: 28.9 % — ABNORMAL LOW (ref 36.0–46.0)
Hemoglobin: 9.4 g/dL — ABNORMAL LOW (ref 12.0–15.0)
Immature Granulocytes: 1 %
Lymphocytes Relative: 32 %
Lymphs Abs: 2 10*3/uL (ref 0.7–4.0)
MCH: 28.4 pg (ref 26.0–34.0)
MCHC: 32.5 g/dL (ref 30.0–36.0)
MCV: 87.3 fL (ref 80.0–100.0)
Monocytes Absolute: 0.5 10*3/uL (ref 0.1–1.0)
Monocytes Relative: 8 %
Neutro Abs: 3.6 10*3/uL (ref 1.7–7.7)
Neutrophils Relative %: 55 %
Platelets: 160 10*3/uL (ref 150–400)
RBC: 3.31 MIL/uL — ABNORMAL LOW (ref 3.87–5.11)
RDW: 15.8 % — ABNORMAL HIGH (ref 11.5–15.5)
WBC: 6.4 10*3/uL (ref 4.0–10.5)
nRBC: 0 % (ref 0.0–0.2)

## 2021-03-22 LAB — COMPREHENSIVE METABOLIC PANEL
ALT: 11 U/L (ref 0–44)
AST: 18 U/L (ref 15–41)
Albumin: 2.9 g/dL — ABNORMAL LOW (ref 3.5–5.0)
Alkaline Phosphatase: 49 U/L (ref 38–126)
Anion gap: 10 (ref 5–15)
BUN: 36 mg/dL — ABNORMAL HIGH (ref 8–23)
CO2: 19 mmol/L — ABNORMAL LOW (ref 22–32)
Calcium: 8.6 mg/dL — ABNORMAL LOW (ref 8.9–10.3)
Chloride: 107 mmol/L (ref 98–111)
Creatinine, Ser: 3.71 mg/dL — ABNORMAL HIGH (ref 0.44–1.00)
GFR, Estimated: 12 mL/min — ABNORMAL LOW (ref >60)
Glucose, Bld: 118 mg/dL — ABNORMAL HIGH (ref 70–99)
Potassium: 4.3 mmol/L (ref 3.5–5.1)
Sodium: 136 mmol/L (ref 135–145)
Total Bilirubin: 0.8 mg/dL (ref 0.3–1.2)
Total Protein: 6.5 g/dL (ref 6.5–8.1)

## 2021-03-22 LAB — BRAIN NATRIURETIC PEPTIDE: B Natriuretic Peptide: 386.2 pg/mL — ABNORMAL HIGH (ref 0.0–100.0)

## 2021-03-22 LAB — GLUCOSE, CAPILLARY
Glucose-Capillary: 118 mg/dL — ABNORMAL HIGH (ref 70–99)
Glucose-Capillary: 134 mg/dL — ABNORMAL HIGH (ref 70–99)
Glucose-Capillary: 149 mg/dL — ABNORMAL HIGH (ref 70–99)

## 2021-03-22 LAB — D-DIMER, QUANTITATIVE: D-Dimer, Quant: 3.37 ug/mL-FEU — ABNORMAL HIGH (ref 0.00–0.50)

## 2021-03-22 LAB — C-REACTIVE PROTEIN: CRP: 2.5 mg/dL — ABNORMAL HIGH (ref <1.0)

## 2021-03-22 LAB — MAGNESIUM: Magnesium: 2 mg/dL (ref 1.7–2.4)

## 2021-03-22 MED ORDER — ISOSORBIDE MONONITRATE ER 60 MG PO TB24: 60.0000 mg | ORAL_TABLET | Freq: Every day | ORAL | 0 refills | Status: AC

## 2021-03-22 MED ORDER — ENSURE ENLIVE PO LIQD: 237.0000 mL | Freq: Three times a day (TID) | ORAL | 0 refills | Status: AC

## 2021-03-22 MED ORDER — HYDRALAZINE HCL 50 MG PO TABS: 50.0000 mg | ORAL_TABLET | Freq: Three times a day (TID) | ORAL | Status: AC

## 2021-03-22 MED ORDER — CARVEDILOL 6.25 MG PO TABS: 6.2500 mg | ORAL_TABLET | Freq: Two times a day (BID) | ORAL | Status: AC

## 2021-03-22 MED ORDER — INSULIN ASPART 100 UNIT/ML FLEXPEN: PEN_INJECTOR | SUBCUTANEOUS | 0 refills | Status: AC

## 2021-03-22 NOTE — TOC Transition Note (Signed)
Transition of Care [facility]) - CM/SW Discharge Note   Patient Details  Name: [name] MRN: [id number] Date of Birth: [birth year]  Transition of Care [facility]) CM/SW Contact:  Benard Halsted, LCSW Phone Number: 03/22/2021, 10:02 AM   Clinical Narrative:    Patient will DC to: [facility] Anticipated DC date: 03/22/21 Family notified: Daughter Transport by: Samara Deist   Per MD patient ready for DC to [place]. RN to call report prior to discharge [phone]). RN, patient, patient's family, and facility notified of DC. Discharge Summary and FL2 sent to facility. DC packet on chart. Ambulance transport requested for patient.   CSW will sign off for now as social work intervention is no longer needed. Please consult [place] again if new needs arise.     Final next level of care: Skilled Nursing Facility Barriers to Discharge: Barriers Resolved   Patient Goals and CMS Choice Patient states their goals for this hospitalization and ongoing recovery are:: Return to snf CMS Medicare.gov Compare Post Acute Care list provided to:: Patient Represenative (must comment) (Daughter) Choice offered to / list presented to : Adult Children  Discharge Placement   Existing PASRR number confirmed : 03/22/21          Patient chooses bed at: [facility] Patient to be transferred to facility by: [facility] Name of family member notified: Daughter Patient and family notified of of transfer: 03/22/21  Discharge Plan and Services In-house Referral: Clinical Social Work   Post Acute Care Choice: [place]                               Social Determinants of Health (SDOH) Interventions     Readmission Risk Interventions No flowsheet data found.

## 2021-03-22 NOTE — Progress Notes (Addendum)
1015: RN attempted to call report to [place] two times unsuccessfully. Will try again later.   1112: Report given to [employer] at [place], time for clarification and questions given.

## 2021-03-22 NOTE — Plan of Care (Signed)
  Problem: Health Behavior/Discharge Planning: Goal: Ability to manage health-related needs will improve Outcome: Completed/Met   Problem: Clinical Measurements: Goal: Ability to maintain clinical measurements within normal limits will improve Outcome: Completed/Met   Problem: Activity: Goal: Risk for activity intolerance will decrease Outcome: Completed/Met   Problem: Nutrition: Goal: Adequate nutrition will be maintained Outcome: Completed/Met   Problem: Coping: Goal: Psychosocial and spiritual needs will be supported Outcome: Completed/Met   Problem: Respiratory: Goal: Will maintain a patent airway Outcome: Completed/Met Goal: Complications related to the disease process, condition or treatment will be avoided or minimized Outcome: Completed/Met

## 2021-03-22 NOTE — Discharge Summary (Signed)
[name] TZG:[id number] DOB: [birth year] DOA: 03/18/2021  PCP: [name], MD  Admit date: 03/18/2021  Discharge date: 03/22/2021  Admitted From: SNF   Disposition:  SNF   Recommendations for Outpatient Follow-up:   Follow up with PCP in 1-2 weeks  PCP Please obtain BMP/CBC, 2 view CXR in 1week,  (see Discharge instructions)   PCP Please follow up on the following pending results: check CBGs QAC-HS at SNF, outpt unintentional weight loss work-up by PCP.   Home Health: None   Equipment/Devices: None  Consultations: None  Discharge Condition: Stable    CODE STATUS: Full    Diet Recommendation: Heart Healthy   Diet Order             Diet - low sodium heart healthy           Diet regular Room service appropriate? Yes; Fluid consistency: Thin  Diet effective now                    Chief Complaint  Patient presents with   Hypoglycemia     Brief history of present illness from the day of admission and additional interim summary    Patient is a 80 y.o. female with history of recent CABG (August 2022), HTN, DM-2, CKD stage IIIb, HLD-presented from SNF for weakness, hypoglycemia, diarrhea.  She was found to have COVID-19 infection.     Pertinent Labs/Radiology:   10/21>>CXR: No pneumonia 10/21>> CT head: No acute intracranial abnormality. 10/21>> renal ultrasound: No hydronephrosis.                                                                 Hospital Course   AKI on CKD stage IIIb: Likely hemodynamically mediated + ARB on board -improved with hydration, now close to baseline, ARB stopped, PCP to moinitor.   Hypoglycemia: Resolved-probably due to use of Amaryl and a setting of worsening renal function.  now on ISS only, check CBGs QAC-HS at SNF.   Acute metabolic encephalopathy: Due  to combination of hypoglycemia and AKI-resolved-CT head non acute.   COVID-19 infection: Not hypoxic-no PNA on CXR-continue Remdesivir-we will plan on 3 days treatment.   Diarrhea: Probably due to COVID-19 infection-resolved.   Elevated D-dimer: Probably due to a combination of COVID-19 infection-recent CABG-she is not hypoxic-Leg [place] -ve.  Was on prophylactic heparin with improving trend.   CAD s/p CABG August 2021: No anginal symptoms   HTN: BP meds adjusted as below, monitor closely at [facility].Marland Kitchen   HLD: Continue statin   Dysphagia: resolved, seen and cleared by SLP.   Unintentional weight loss: TSH within normal limits-suspect could have been from recent hospitalization-we will consult nutrition services-maximize supplements-and defer further work-up to the outpatient setting to PCP.   DM-2 (A1c 9.1 on 8/22): ISS, check CBGs  QAC[facility]HS at [facility]   Discharge diagnosis     Active Problems:   AKI (acute kidney injury) ([place])   COVID    Discharge instructions    Discharge Instructions     Diet - low sodium heart healthy   Complete by: As directed    Discharge instructions   Complete by: As directed    Follow with Primary MD [name], MD in 7 days   Get CBC, CMP, 2 view Chest X ray -  checked next visit within 1 week by Primary MD or SNF MD    Activity: As tolerated with Full fall precautions use walker/cane & assistance as needed  Disposition Home    Diet: Heart Healthy    Special Instructions: If you have smoked or chewed Tobacco  in the last 2 yrs please stop smoking, stop any regular Alcohol  and or any Recreational drug use.  On your next visit with your primary care physician please Get Medicines reviewed and adjusted.  Please request your Prim.MD to go over all Hospital Tests and Procedure/Radiological results at the follow up, please get all Hospital records sent to your Prim MD by signing hospital release before you go home.  If you experience worsening of your  admission symptoms, develop shortness of breath, life threatening emergency, suicidal or homicidal thoughts you must seek medical attention immediately by calling 911 or calling your MD immediately  if symptoms less severe.  You Must read complete instructions/literature along with all the possible adverse reactions/side effects for all the Medicines you take and that have been prescribed to you. Take any new Medicines after you have completely understood and accpet all the possible adverse reactions/side effects.   Increase activity slowly   Complete by: As directed        Discharge Medications   Allergies as of 03/22/2021       Reactions   Keflex [cephalexin] Shortness Of Breath, Other (See Comments)   Chest pain, headache   Lipitor [atorvastatin] Shortness Of Breath, Other (See Comments)   Severe pain/cramping in legs- "I cannot walk, talk, or breathe" (pt is currently taking 20 mg daily 10/11/19)   Codeine Other (See Comments)   Disrupted patient's equilibrium- "Made my world flip upside down"   Hydrochlorothiazide Other (See Comments)   Other reaction(s): lightheaded, nausea   Hydrocodone-acetaminophen Other (See Comments)   Other reaction(s): "get crazy"   Metformin Hcl Other (See Comments)   Other reaction(s): upset stomach   Tramadol Hcl Other (See Comments)   Other reaction(s): "get crazy"   Azor [amlodipine-olmesartan] Swelling, Palpitations, Rash   Amlodipine alone resumed 12/2013, tolerating   Lisinopril Cough   Penicillins Dermatitis, Other (See Comments)   Did it involve swelling of the face/tongue/throat, SOB, or low BP? No, just worsened eczema Did it involve sudden or severe rash/hives, skin peeling, or any reaction on the inside of your mouth or nose? No Did you need to seek medical attention at a hospital or doctor's office? No When did it last happen? "More than 10 years ago" If all above answers are "NO", may proceed with cephalosporin use.         Medication List     STOP taking these medications    glimepiride 2 MG tablet Commonly known as: AMARYL   insulin aspart 100 UNIT/ML injection Commonly known as: novoLOG Replaced by: insulin aspart 100 UNIT/ML FlexPen   losartan 25 MG tablet Commonly known as: COZAAR   metoprolol tartrate 25 MG tablet Commonly known as:  LOPRESSOR       TAKE these medications    acetaminophen 650 MG CR tablet Commonly known as: TYLENOL Take 650 mg by mouth at bedtime as needed for pain.   albuterol 108 (90 Base) MCG/ACT inhaler Commonly known as: VENTOLIN HFA Inhale 2 puffs into the lungs every 4 (four) hours as needed for wheezing or shortness of breath (or coughing).   amLODipine 10 MG tablet Commonly known as: NORVASC Take 10 mg by mouth daily.   aspirin 81 MG EC tablet Take 1 tablet (81 mg total) by mouth daily. Swallow whole.   BIOFREEZE EX Apply topically. Apply topically to affected areas three times daily as needed for pain.   busPIRone 5 MG tablet Commonly known as: BUSPAR Take 1 tablet (5 mg total) by mouth 2 (two) times daily.   carvedilol 6.25 MG tablet Commonly known as: COREG Take 1 tablet (6.25 mg total) by mouth 2 (two) times daily with a meal.   clopidogrel 75 MG tablet Commonly known as: PLAVIX Take 1 tablet (75 mg total) by mouth daily.   famotidine 10 MG tablet Commonly known as: PEPCID Take 10 mg by mouth 2 (two) times daily.   feeding supplement Liqd Take 237 mLs by mouth 3 (three) times daily between meals.   hydrALAZINE 50 MG tablet Commonly known as: APRESOLINE Take 1 tablet (50 mg total) by mouth every 8 (eight) hours.   insulin aspart 100 UNIT/ML FlexPen Commonly known as: NOVOLOG Before each meal 3 times a day, 140-199 - 2 units, 200-250 - 4 units, 251-299 - 6 units,  300-349 - 8 units,  350 or above 10 units. Insulin PEN if approved, provide syringes and needles if needed. Replaces: insulin aspart 100 UNIT/ML injection   isosorbide  mononitrate 60 MG 24 hr tablet Commonly known as: IMDUR Take 1 tablet (60 mg total) by mouth daily. Start taking on: March 23, 2021 What changed:  medication strength how much to take   pantoprazole 40 MG tablet Commonly known as: PROTONIX Take 1 tablet (40 mg total) by mouth daily.   rosuvastatin 10 MG tablet Commonly known as: CRESTOR Take 1 tablet (10 mg total) by mouth at bedtime.   simethicone 125 MG chewable tablet Commonly known as: MYLICON Chew 675 mg by mouth in the morning and at bedtime.   varenicline 0.5 MG tablet Commonly known as: CHANTIX Take 0.5 mg by mouth 2 (two) times daily.   vitamin B-12 250 MCG tablet Commonly known as: CYANOCOBALAMIN Take 1 tablet (250 mcg total) by mouth daily.         Follow-up Information     [name], MD. Schedule an appointment as soon as possible for a visit in 1 week(s).   Specialty: Family Medicine Contact information: [address] [place] [address] [phone]                 Major procedures and Radiology Reports - PLEASE review detailed and final reports thoroughly  -       CT HEAD WO CONTRAST (5MM)  Result Date: 03/18/2021 CLINICAL DATA:  Mental status change EXAM: CT HEAD WITHOUT CONTRAST TECHNIQUE: Contiguous axial images were obtained from the base of the skull through the vertex without intravenous contrast. COMPARISON:  10/11/2019 FINDINGS: Brain: No evidence of acute infarction, hemorrhage, hydrocephalus, extra-axial collection or mass lesion/mass effect. Vascular: No hyperdense vessel or unexpected calcification. Skull: Normal. Negative for fracture or focal lesion. Sinuses/Orbits: No acute finding. Other: None. IMPRESSION: No acute  intracranial abnormality. Electronically Signed   By: Miachel Roux M.D.   On: 03/18/2021 12:33   US RENAL  Result Date: 03/18/2021 CLINICAL DATA:  Acute renal insufficiency, hypertension EXAM: RENAL / URINARY TRACT ULTRASOUND COMPLETE  COMPARISON:  03/22/2018 FINDINGS: Right Kidney: Renal measurements: 8.9 x 4.5 x 4.6 cm = volume: 97 mL. Normal echotexture. 1.3 cm simple cyst within the upper pole and a 1.3 cm simple cyst within the lower pole. No hydronephrosis or nephrolithiasis. Left Kidney: Renal measurements: 4.4 x 8.9 x 5.3 cm = volume: 109 mL. Echogenicity within normal limits. No mass or hydronephrosis visualized. Bladder: Appears normal for degree of bladder distention. Other: None. IMPRESSION: 1. Simple appearing right renal cysts. Otherwise unremarkable renal ultrasound. Electronically Signed   By: Randa Ngo M.D.   On: 03/18/2021 16:39   DG Chest Portable 1 View  Result Date: 03/18/2021 CLINICAL DATA:  An 80 year old female presents with productive cough, hyperglycemia and chills. EXAM: PORTABLE CHEST 1 VIEW COMPARISON:  Comparison made with January 31, 2021. FINDINGS: Image rotated slightly to the LEFT. Cardiomediastinal contours and hilar structures are stable. Post median sternotomy with signs of CABG. EKG leads project over the chest. Lungs are clear. No sign of effusion on frontal radiograph. No pneumothorax. On limited assessment there is no acute skeletal process. IMPRESSION: No active cardiopulmonary disease. Electronically Signed   By: Zetta Bills M.D.   On: 03/18/2021 11:59   VAS [place] LOWER EXTREMITY VENOUS (DVT)  Result Date: 03/20/2021  Lower Venous DVT Study Patient Name:  [name]  Date of Exam:   03/20/2021 Medical Rec #: [id number]       Accession #:    [id number] Date of Birth: 1940/07/24        Patient Gender: F Patient Age:   80 years Exam Location:  [facility] Procedure:      VAS [place] LOWER EXTREMITY VENOUS (DVT) Referring Phys: Oren Binet --------------------------------------------------------------------------------  Indications: Elevated Ddimer.  Risk Factors: COVID 19 positive. Comparison Study: No prior studies. Performing Technologist: Oliver Hum RVT  Examination  Guidelines: A complete evaluation includes B-mode imaging, spectral Doppler, color Doppler, and power Doppler as needed of all accessible portions of each vessel. Bilateral testing is considered an integral part of a complete examination. Limited examinations for reoccurring indications may be performed as noted. The reflux portion of the exam is performed with the patient in reverse Trendelenburg.  +---------+---------------+---------+-----------+----------+--------------+ RIGHT    CompressibilityPhasicitySpontaneityPropertiesThrombus Aging +---------+---------------+---------+-----------+----------+--------------+ CFV      Full           Yes      Yes                                 +---------+---------------+---------+-----------+----------+--------------+ SFJ      Full                                                        +---------+---------------+---------+-----------+----------+--------------+ FV Prox  Full                                                        +---------+---------------+---------+-----------+----------+--------------+  FV Mid   Full                                                        +---------+---------------+---------+-----------+----------+--------------+ FV DistalFull                                                        +---------+---------------+---------+-----------+----------+--------------+ PFV      Full                                                        +---------+---------------+---------+-----------+----------+--------------+ POP      Full           Yes      Yes                                 +---------+---------------+---------+-----------+----------+--------------+ PTV      Full                                                        +---------+---------------+---------+-----------+----------+--------------+ PERO     Full                                                         +---------+---------------+---------+-----------+----------+--------------+   +---------+---------------+---------+-----------+----------+--------------+ LEFT     CompressibilityPhasicitySpontaneityPropertiesThrombus Aging +---------+---------------+---------+-----------+----------+--------------+ CFV      Full           Yes      Yes                                 +---------+---------------+---------+-----------+----------+--------------+ SFJ      Full                                                        +---------+---------------+---------+-----------+----------+--------------+ FV Prox  Full                                                        +---------+---------------+---------+-----------+----------+--------------+ FV Mid   Full                                                        +---------+---------------+---------+-----------+----------+--------------+  FV DistalFull                                                        +---------+---------------+---------+-----------+----------+--------------+ PFV      Full                                                        +---------+---------------+---------+-----------+----------+--------------+ POP      Full           Yes      Yes                                 +---------+---------------+---------+-----------+----------+--------------+ PTV      Full                                                        +---------+---------------+---------+-----------+----------+--------------+ PERO     Full                                                        +---------+---------------+---------+-----------+----------+--------------+     Summary: RIGHT: - There is no evidence of deep vein thrombosis in the lower extremity.  - No cystic structure found in the popliteal fossa.  LEFT: - There is no evidence of deep vein thrombosis in the lower extremity.  - No cystic structure found in the popliteal fossa.   *See table(s) above for measurements and observations. Electronically signed by Deitra Mayo MD on 03/20/2021 at 2:39:06 PM.    Final      Today   [name] today has no headache,no chest abdominal pain,no new weakness tingling or numbness, feels much better wants to go home today.     Objective   Blood pressure 140/65, pulse 80, temperature 98.5 F (36.9 C), temperature source Oral, resp. rate 19, height 5\' 7"  (1.702 m), weight 71.2 kg, SpO2 96 %.  No intake or output data in the 24 hours ending 03/22/21 0945  Exam  Awake Alert x 2, No new F.N deficits, Normal affect [place].AT,PERRAL Supple Neck,No JVD, No cervical lymphadenopathy appriciated.  Symmetrical Chest wall movement, Good air movement bilaterally, CTAB RRR,No Gallops,Rubs or new Murmurs, No Parasternal Heave +ve B.Sounds, Abd Soft, Non tender, No organomegaly appriciated, No rebound -guarding or rigidity. No Cyanosis, Clubbing or edema, No new Rash or bruise   Data Review   CBC w Diff:  Lab Results  Component Value Date   WBC 6.4 03/22/2021   HGB 9.4 (L) 03/22/2021   HCT 28.9 (L) 03/22/2021   PLT 160 03/22/2021   LYMPHOPCT 32 03/22/2021   MONOPCT 8 03/22/2021   EOSPCT 3 03/22/2021   BASOPCT 1 03/22/2021    CMP:  Lab Results  Component Value Date   NA 136 03/22/2021  NA 137 03/07/2021   K 4.3 03/22/2021   CL 107 03/22/2021   CO2 19 (L) 03/22/2021   BUN 36 (H) 03/22/2021   BUN 43 (A) 03/07/2021   CREATININE 3.71 (H) 03/22/2021   GLU 184 03/07/2021   PROT 6.5 03/22/2021   ALBUMIN 2.9 (L) 03/22/2021   BILITOT 0.8 03/22/2021   ALKPHOS 49 03/22/2021   AST 18 03/22/2021   ALT 11 03/22/2021  .   Total Time in preparing paper work, data evaluation and todays exam - 24 minutes  Lala Lund M.D on 03/22/2021 at 9:45 AM  [facility]

## 2021-03-22 NOTE — Discharge Instructions (Signed)
Follow with Primary MD [name], MD in 7 days   Get CBC, CMP, 2 view Chest X ray -  checked next visit within 1 week by Primary MD or SNF MD    Activity: As tolerated with Full fall precautions use walker/cane & assistance as needed  Disposition Home    Diet: Heart Healthy    Special Instructions: If you have smoked or chewed Tobacco  in the last 2 yrs please stop smoking, stop any regular Alcohol  and or any Recreational drug use.  On your next visit with your primary care physician please Get Medicines reviewed and adjusted.  Please request your Prim.MD to go over all Hospital Tests and Procedure/Radiological results at the follow up, please get all Hospital records sent to your Prim MD by signing hospital release before you go home.  If you experience worsening of your admission symptoms, develop shortness of breath, life threatening emergency, suicidal or homicidal thoughts you must seek medical attention immediately by calling 911 or calling your MD immediately  if symptoms less severe.  You Must read complete instructions/literature along with all the possible adverse reactions/side effects for all the Medicines you take and that have been prescribed to you. Take any new Medicines after you have completely understood and accpet all the possible adverse reactions/side effects.

## 2021-03-23 DIAGNOSIS — G9340 Encephalopathy, unspecified: Secondary | ICD-10-CM | POA: Diagnosis not present

## 2021-03-23 DIAGNOSIS — I2510 Atherosclerotic heart disease of native coronary artery without angina pectoris: Secondary | ICD-10-CM | POA: Diagnosis not present

## 2021-03-23 DIAGNOSIS — E1159 Type 2 diabetes mellitus with other circulatory complications: Secondary | ICD-10-CM | POA: Diagnosis not present

## 2021-03-23 DIAGNOSIS — Z951 Presence of aortocoronary bypass graft: Secondary | ICD-10-CM | POA: Diagnosis not present

## 2021-03-23 DIAGNOSIS — N1832 Chronic kidney disease, stage 3b: Secondary | ICD-10-CM | POA: Diagnosis not present

## 2021-03-23 DIAGNOSIS — I152 Hypertension secondary to endocrine disorders: Secondary | ICD-10-CM | POA: Diagnosis not present

## 2021-03-23 DIAGNOSIS — U071 COVID-19: Secondary | ICD-10-CM | POA: Diagnosis not present

## 2021-03-23 DIAGNOSIS — I739 Peripheral vascular disease, unspecified: Secondary | ICD-10-CM | POA: Diagnosis not present

## 2021-03-23 DIAGNOSIS — N179 Acute kidney failure, unspecified: Secondary | ICD-10-CM | POA: Diagnosis not present

## 2021-03-23 DIAGNOSIS — N183 Chronic kidney disease, stage 3 unspecified: Secondary | ICD-10-CM | POA: Diagnosis not present

## 2021-03-25 ENCOUNTER — Telehealth (HOSPITAL_COMMUNITY): Payer: Self-pay

## 2021-03-25 ENCOUNTER — Encounter (HOSPITAL_COMMUNITY): Payer: Self-pay

## 2021-03-25 NOTE — Telephone Encounter (Signed)
Attempted to call patient in regards to Cardiac Rehab - pt phone number was disconnected left a messages on her daughters phone to call back. Mailed letter

## 2021-03-30 DIAGNOSIS — R278 Other lack of coordination: Secondary | ICD-10-CM | POA: Diagnosis not present

## 2021-03-30 DIAGNOSIS — M6281 Muscle weakness (generalized): Secondary | ICD-10-CM | POA: Diagnosis not present

## 2021-04-18 NOTE — Telephone Encounter (Signed)
No response from pt in regards to Cardiac Rehab. Closed referral

## 2021-04-20 DIAGNOSIS — R278 Other lack of coordination: Secondary | ICD-10-CM | POA: Diagnosis not present

## 2021-04-20 DIAGNOSIS — M6281 Muscle weakness (generalized): Secondary | ICD-10-CM | POA: Diagnosis not present

## 2021-04-21 DIAGNOSIS — R278 Other lack of coordination: Secondary | ICD-10-CM | POA: Diagnosis not present

## 2021-04-21 DIAGNOSIS — M6281 Muscle weakness (generalized): Secondary | ICD-10-CM | POA: Diagnosis not present

## 2021-04-22 DIAGNOSIS — R278 Other lack of coordination: Secondary | ICD-10-CM | POA: Diagnosis not present

## 2021-04-22 DIAGNOSIS — M6281 Muscle weakness (generalized): Secondary | ICD-10-CM | POA: Diagnosis not present

## 2021-04-25 DIAGNOSIS — M6281 Muscle weakness (generalized): Secondary | ICD-10-CM | POA: Diagnosis not present

## 2021-04-25 DIAGNOSIS — N183 Chronic kidney disease, stage 3 unspecified: Secondary | ICD-10-CM | POA: Diagnosis not present

## 2021-04-25 DIAGNOSIS — I152 Hypertension secondary to endocrine disorders: Secondary | ICD-10-CM | POA: Diagnosis not present

## 2021-04-25 DIAGNOSIS — Z951 Presence of aortocoronary bypass graft: Secondary | ICD-10-CM | POA: Diagnosis not present

## 2021-04-25 DIAGNOSIS — I2510 Atherosclerotic heart disease of native coronary artery without angina pectoris: Secondary | ICD-10-CM | POA: Diagnosis not present

## 2021-04-25 DIAGNOSIS — R278 Other lack of coordination: Secondary | ICD-10-CM | POA: Diagnosis not present

## 2021-04-25 DIAGNOSIS — E1159 Type 2 diabetes mellitus with other circulatory complications: Secondary | ICD-10-CM | POA: Diagnosis not present

## 2021-04-25 DIAGNOSIS — I679 Cerebrovascular disease, unspecified: Secondary | ICD-10-CM | POA: Diagnosis not present

## 2021-04-26 DIAGNOSIS — M6281 Muscle weakness (generalized): Secondary | ICD-10-CM | POA: Diagnosis not present

## 2021-04-26 DIAGNOSIS — R278 Other lack of coordination: Secondary | ICD-10-CM | POA: Diagnosis not present

## 2021-04-27 DIAGNOSIS — M6281 Muscle weakness (generalized): Secondary | ICD-10-CM | POA: Diagnosis not present

## 2021-04-27 DIAGNOSIS — R278 Other lack of coordination: Secondary | ICD-10-CM | POA: Diagnosis not present

## 2021-04-28 DIAGNOSIS — M6281 Muscle weakness (generalized): Secondary | ICD-10-CM | POA: Diagnosis not present

## 2021-04-28 DIAGNOSIS — Z96642 Presence of left artificial hip joint: Secondary | ICD-10-CM | POA: Diagnosis not present

## 2021-04-28 DIAGNOSIS — I63311 Cerebral infarction due to thrombosis of right middle cerebral artery: Secondary | ICD-10-CM | POA: Diagnosis not present

## 2021-04-28 DIAGNOSIS — R262 Difficulty in walking, not elsewhere classified: Secondary | ICD-10-CM | POA: Diagnosis not present

## 2021-04-28 DIAGNOSIS — U071 COVID-19: Secondary | ICD-10-CM | POA: Diagnosis not present

## 2021-04-29 DIAGNOSIS — Z96642 Presence of left artificial hip joint: Secondary | ICD-10-CM | POA: Diagnosis not present

## 2021-04-29 DIAGNOSIS — R262 Difficulty in walking, not elsewhere classified: Secondary | ICD-10-CM | POA: Diagnosis not present

## 2021-04-29 DIAGNOSIS — I63311 Cerebral infarction due to thrombosis of right middle cerebral artery: Secondary | ICD-10-CM | POA: Diagnosis not present

## 2021-04-29 DIAGNOSIS — M6281 Muscle weakness (generalized): Secondary | ICD-10-CM | POA: Diagnosis not present

## 2021-04-29 DIAGNOSIS — U071 COVID-19: Secondary | ICD-10-CM | POA: Diagnosis not present

## 2021-05-01 DIAGNOSIS — Z96642 Presence of left artificial hip joint: Secondary | ICD-10-CM | POA: Diagnosis not present

## 2021-05-01 DIAGNOSIS — U071 COVID-19: Secondary | ICD-10-CM | POA: Diagnosis not present

## 2021-05-01 DIAGNOSIS — R262 Difficulty in walking, not elsewhere classified: Secondary | ICD-10-CM | POA: Diagnosis not present

## 2021-05-01 DIAGNOSIS — I63311 Cerebral infarction due to thrombosis of right middle cerebral artery: Secondary | ICD-10-CM | POA: Diagnosis not present

## 2021-05-01 DIAGNOSIS — M6281 Muscle weakness (generalized): Secondary | ICD-10-CM | POA: Diagnosis not present

## 2021-05-02 DIAGNOSIS — I63311 Cerebral infarction due to thrombosis of right middle cerebral artery: Secondary | ICD-10-CM | POA: Diagnosis not present

## 2021-05-02 DIAGNOSIS — U071 COVID-19: Secondary | ICD-10-CM | POA: Diagnosis not present

## 2021-05-02 DIAGNOSIS — R262 Difficulty in walking, not elsewhere classified: Secondary | ICD-10-CM | POA: Diagnosis not present

## 2021-05-02 DIAGNOSIS — Z96642 Presence of left artificial hip joint: Secondary | ICD-10-CM | POA: Diagnosis not present

## 2021-05-02 DIAGNOSIS — M6281 Muscle weakness (generalized): Secondary | ICD-10-CM | POA: Diagnosis not present

## 2021-05-16 DIAGNOSIS — N1832 Chronic kidney disease, stage 3b: Secondary | ICD-10-CM | POA: Diagnosis not present

## 2021-05-16 DIAGNOSIS — R5383 Other fatigue: Secondary | ICD-10-CM | POA: Diagnosis not present

## 2021-05-16 DIAGNOSIS — I739 Peripheral vascular disease, unspecified: Secondary | ICD-10-CM | POA: Diagnosis not present

## 2021-05-16 DIAGNOSIS — R609 Edema, unspecified: Secondary | ICD-10-CM | POA: Diagnosis not present

## 2021-05-16 DIAGNOSIS — Z951 Presence of aortocoronary bypass graft: Secondary | ICD-10-CM | POA: Diagnosis not present

## 2021-05-16 DIAGNOSIS — E1159 Type 2 diabetes mellitus with other circulatory complications: Secondary | ICD-10-CM | POA: Diagnosis not present

## 2021-05-16 DIAGNOSIS — K219 Gastro-esophageal reflux disease without esophagitis: Secondary | ICD-10-CM | POA: Diagnosis not present

## 2021-05-16 DIAGNOSIS — I2510 Atherosclerotic heart disease of native coronary artery without angina pectoris: Secondary | ICD-10-CM | POA: Diagnosis not present

## 2021-05-16 DIAGNOSIS — I152 Hypertension secondary to endocrine disorders: Secondary | ICD-10-CM | POA: Diagnosis not present

## 2021-05-23 DIAGNOSIS — Z96642 Presence of left artificial hip joint: Secondary | ICD-10-CM | POA: Diagnosis not present

## 2021-05-23 DIAGNOSIS — I63311 Cerebral infarction due to thrombosis of right middle cerebral artery: Secondary | ICD-10-CM | POA: Diagnosis not present

## 2021-05-23 DIAGNOSIS — M6281 Muscle weakness (generalized): Secondary | ICD-10-CM | POA: Diagnosis not present

## 2021-05-23 DIAGNOSIS — R262 Difficulty in walking, not elsewhere classified: Secondary | ICD-10-CM | POA: Diagnosis not present

## 2021-05-23 DIAGNOSIS — U071 COVID-19: Secondary | ICD-10-CM | POA: Diagnosis not present

## 2021-05-24 DIAGNOSIS — M6281 Muscle weakness (generalized): Secondary | ICD-10-CM | POA: Diagnosis not present

## 2021-05-24 DIAGNOSIS — Z96642 Presence of left artificial hip joint: Secondary | ICD-10-CM | POA: Diagnosis not present

## 2021-05-24 DIAGNOSIS — U071 COVID-19: Secondary | ICD-10-CM | POA: Diagnosis not present

## 2021-05-24 DIAGNOSIS — I63311 Cerebral infarction due to thrombosis of right middle cerebral artery: Secondary | ICD-10-CM | POA: Diagnosis not present

## 2021-05-24 DIAGNOSIS — R262 Difficulty in walking, not elsewhere classified: Secondary | ICD-10-CM | POA: Diagnosis not present

## 2021-05-25 DIAGNOSIS — U071 COVID-19: Secondary | ICD-10-CM | POA: Diagnosis not present

## 2021-05-25 DIAGNOSIS — I63311 Cerebral infarction due to thrombosis of right middle cerebral artery: Secondary | ICD-10-CM | POA: Diagnosis not present

## 2021-05-25 DIAGNOSIS — R262 Difficulty in walking, not elsewhere classified: Secondary | ICD-10-CM | POA: Diagnosis not present

## 2021-05-25 DIAGNOSIS — M6281 Muscle weakness (generalized): Secondary | ICD-10-CM | POA: Diagnosis not present

## 2021-05-25 DIAGNOSIS — Z96642 Presence of left artificial hip joint: Secondary | ICD-10-CM | POA: Diagnosis not present

## 2021-05-26 DIAGNOSIS — M6281 Muscle weakness (generalized): Secondary | ICD-10-CM | POA: Diagnosis not present

## 2021-05-26 DIAGNOSIS — Z96642 Presence of left artificial hip joint: Secondary | ICD-10-CM | POA: Diagnosis not present

## 2021-05-26 DIAGNOSIS — R262 Difficulty in walking, not elsewhere classified: Secondary | ICD-10-CM | POA: Diagnosis not present

## 2021-05-26 DIAGNOSIS — I63311 Cerebral infarction due to thrombosis of right middle cerebral artery: Secondary | ICD-10-CM | POA: Diagnosis not present

## 2021-05-26 DIAGNOSIS — U071 COVID-19: Secondary | ICD-10-CM | POA: Diagnosis not present

## 2021-05-27 DIAGNOSIS — U071 COVID-19: Secondary | ICD-10-CM | POA: Diagnosis not present

## 2021-05-27 DIAGNOSIS — M6281 Muscle weakness (generalized): Secondary | ICD-10-CM | POA: Diagnosis not present

## 2021-05-27 DIAGNOSIS — R262 Difficulty in walking, not elsewhere classified: Secondary | ICD-10-CM | POA: Diagnosis not present

## 2021-05-27 DIAGNOSIS — Z96642 Presence of left artificial hip joint: Secondary | ICD-10-CM | POA: Diagnosis not present

## 2021-05-27 DIAGNOSIS — I63311 Cerebral infarction due to thrombosis of right middle cerebral artery: Secondary | ICD-10-CM | POA: Diagnosis not present

## 2021-05-30 DIAGNOSIS — U071 COVID-19: Secondary | ICD-10-CM | POA: Diagnosis not present

## 2021-05-30 DIAGNOSIS — Z993 Dependence on wheelchair: Secondary | ICD-10-CM | POA: Diagnosis not present

## 2021-05-30 DIAGNOSIS — R262 Difficulty in walking, not elsewhere classified: Secondary | ICD-10-CM | POA: Diagnosis not present

## 2021-05-30 DIAGNOSIS — I63311 Cerebral infarction due to thrombosis of right middle cerebral artery: Secondary | ICD-10-CM | POA: Diagnosis not present

## 2021-05-31 DIAGNOSIS — U071 COVID-19: Secondary | ICD-10-CM | POA: Diagnosis not present

## 2021-05-31 DIAGNOSIS — I63311 Cerebral infarction due to thrombosis of right middle cerebral artery: Secondary | ICD-10-CM | POA: Diagnosis not present

## 2021-05-31 DIAGNOSIS — Z993 Dependence on wheelchair: Secondary | ICD-10-CM | POA: Diagnosis not present

## 2021-05-31 DIAGNOSIS — R262 Difficulty in walking, not elsewhere classified: Secondary | ICD-10-CM | POA: Diagnosis not present

## 2021-06-01 DIAGNOSIS — R262 Difficulty in walking, not elsewhere classified: Secondary | ICD-10-CM | POA: Diagnosis not present

## 2021-06-01 DIAGNOSIS — I63311 Cerebral infarction due to thrombosis of right middle cerebral artery: Secondary | ICD-10-CM | POA: Diagnosis not present

## 2021-06-01 DIAGNOSIS — Z993 Dependence on wheelchair: Secondary | ICD-10-CM | POA: Diagnosis not present

## 2021-06-01 DIAGNOSIS — U071 COVID-19: Secondary | ICD-10-CM | POA: Diagnosis not present

## 2021-06-02 DIAGNOSIS — R262 Difficulty in walking, not elsewhere classified: Secondary | ICD-10-CM | POA: Diagnosis not present

## 2021-06-02 DIAGNOSIS — I63311 Cerebral infarction due to thrombosis of right middle cerebral artery: Secondary | ICD-10-CM | POA: Diagnosis not present

## 2021-06-02 DIAGNOSIS — U071 COVID-19: Secondary | ICD-10-CM | POA: Diagnosis not present

## 2021-06-02 DIAGNOSIS — Z993 Dependence on wheelchair: Secondary | ICD-10-CM | POA: Diagnosis not present

## 2021-06-03 DIAGNOSIS — R262 Difficulty in walking, not elsewhere classified: Secondary | ICD-10-CM | POA: Diagnosis not present

## 2021-06-03 DIAGNOSIS — Z993 Dependence on wheelchair: Secondary | ICD-10-CM | POA: Diagnosis not present

## 2021-06-03 DIAGNOSIS — U071 COVID-19: Secondary | ICD-10-CM | POA: Diagnosis not present

## 2021-06-03 DIAGNOSIS — I63311 Cerebral infarction due to thrombosis of right middle cerebral artery: Secondary | ICD-10-CM | POA: Diagnosis not present

## 2021-06-14 DIAGNOSIS — W19XXXA Unspecified fall, initial encounter: Secondary | ICD-10-CM | POA: Diagnosis not present

## 2021-06-14 DIAGNOSIS — M25552 Pain in left hip: Secondary | ICD-10-CM | POA: Diagnosis not present

## 2021-06-15 DIAGNOSIS — K219 Gastro-esophageal reflux disease without esophagitis: Secondary | ICD-10-CM | POA: Diagnosis not present

## 2021-06-15 DIAGNOSIS — E1159 Type 2 diabetes mellitus with other circulatory complications: Secondary | ICD-10-CM | POA: Diagnosis not present

## 2021-06-15 DIAGNOSIS — I152 Hypertension secondary to endocrine disorders: Secondary | ICD-10-CM | POA: Diagnosis not present

## 2021-06-15 DIAGNOSIS — Z9189 Other specified personal risk factors, not elsewhere classified: Secondary | ICD-10-CM | POA: Diagnosis not present

## 2021-06-15 DIAGNOSIS — I679 Cerebrovascular disease, unspecified: Secondary | ICD-10-CM | POA: Diagnosis not present

## 2021-06-15 DIAGNOSIS — M25559 Pain in unspecified hip: Secondary | ICD-10-CM | POA: Diagnosis not present

## 2021-06-20 DIAGNOSIS — E1159 Type 2 diabetes mellitus with other circulatory complications: Secondary | ICD-10-CM | POA: Diagnosis not present

## 2021-06-20 DIAGNOSIS — N1832 Chronic kidney disease, stage 3b: Secondary | ICD-10-CM | POA: Diagnosis not present

## 2021-06-20 DIAGNOSIS — I152 Hypertension secondary to endocrine disorders: Secondary | ICD-10-CM | POA: Diagnosis not present

## 2021-06-20 DIAGNOSIS — I69354 Hemiplegia and hemiparesis following cerebral infarction affecting left non-dominant side: Secondary | ICD-10-CM | POA: Diagnosis not present

## 2021-06-20 DIAGNOSIS — Z8673 Personal history of transient ischemic attack (TIA), and cerebral infarction without residual deficits: Secondary | ICD-10-CM | POA: Diagnosis not present

## 2021-06-20 DIAGNOSIS — F17210 Nicotine dependence, cigarettes, uncomplicated: Secondary | ICD-10-CM | POA: Diagnosis not present

## 2021-06-20 DIAGNOSIS — I129 Hypertensive chronic kidney disease with stage 1 through stage 4 chronic kidney disease, or unspecified chronic kidney disease: Secondary | ICD-10-CM | POA: Diagnosis not present

## 2021-06-20 DIAGNOSIS — N189 Chronic kidney disease, unspecified: Secondary | ICD-10-CM | POA: Diagnosis not present

## 2021-06-20 DIAGNOSIS — E1122 Type 2 diabetes mellitus with diabetic chronic kidney disease: Secondary | ICD-10-CM | POA: Diagnosis not present

## 2021-06-20 DIAGNOSIS — I2510 Atherosclerotic heart disease of native coronary artery without angina pectoris: Secondary | ICD-10-CM | POA: Diagnosis not present

## 2021-06-20 DIAGNOSIS — I701 Atherosclerosis of renal artery: Secondary | ICD-10-CM | POA: Diagnosis not present

## 2021-06-21 DIAGNOSIS — F17290 Nicotine dependence, other tobacco product, uncomplicated: Secondary | ICD-10-CM | POA: Diagnosis not present

## 2021-06-21 DIAGNOSIS — I2510 Atherosclerotic heart disease of native coronary artery without angina pectoris: Secondary | ICD-10-CM | POA: Diagnosis not present

## 2021-06-21 DIAGNOSIS — I10 Essential (primary) hypertension: Secondary | ICD-10-CM | POA: Diagnosis not present

## 2021-06-21 DIAGNOSIS — N189 Chronic kidney disease, unspecified: Secondary | ICD-10-CM | POA: Diagnosis not present

## 2021-06-21 DIAGNOSIS — E1169 Type 2 diabetes mellitus with other specified complication: Secondary | ICD-10-CM | POA: Diagnosis not present

## 2021-07-14 DIAGNOSIS — L602 Onychogryphosis: Secondary | ICD-10-CM | POA: Diagnosis not present

## 2021-07-14 DIAGNOSIS — I739 Peripheral vascular disease, unspecified: Secondary | ICD-10-CM | POA: Diagnosis not present

## 2021-08-05 DIAGNOSIS — J9690 Respiratory failure, unspecified, unspecified whether with hypoxia or hypercapnia: Secondary | ICD-10-CM | POA: Diagnosis not present

## 2021-08-05 DIAGNOSIS — I517 Cardiomegaly: Secondary | ICD-10-CM | POA: Diagnosis not present

## 2021-08-05 DIAGNOSIS — R2689 Other abnormalities of gait and mobility: Secondary | ICD-10-CM | POA: Diagnosis not present

## 2021-08-05 DIAGNOSIS — I10 Essential (primary) hypertension: Secondary | ICD-10-CM | POA: Diagnosis not present

## 2021-08-05 DIAGNOSIS — M6281 Muscle weakness (generalized): Secondary | ICD-10-CM | POA: Diagnosis not present

## 2021-08-05 DIAGNOSIS — I2510 Atherosclerotic heart disease of native coronary artery without angina pectoris: Secondary | ICD-10-CM | POA: Diagnosis not present

## 2021-08-05 DIAGNOSIS — J90 Pleural effusion, not elsewhere classified: Secondary | ICD-10-CM | POA: Diagnosis not present

## 2021-08-05 DIAGNOSIS — N390 Urinary tract infection, site not specified: Secondary | ICD-10-CM | POA: Diagnosis not present

## 2021-08-05 DIAGNOSIS — E1151 Type 2 diabetes mellitus with diabetic peripheral angiopathy without gangrene: Secondary | ICD-10-CM | POA: Diagnosis not present

## 2021-08-05 DIAGNOSIS — R2681 Unsteadiness on feet: Secondary | ICD-10-CM | POA: Diagnosis not present

## 2021-08-05 DIAGNOSIS — D649 Anemia, unspecified: Secondary | ICD-10-CM | POA: Diagnosis not present

## 2021-08-06 DIAGNOSIS — R2689 Other abnormalities of gait and mobility: Secondary | ICD-10-CM | POA: Diagnosis not present

## 2021-08-06 DIAGNOSIS — R2681 Unsteadiness on feet: Secondary | ICD-10-CM | POA: Diagnosis not present

## 2021-08-06 DIAGNOSIS — E1151 Type 2 diabetes mellitus with diabetic peripheral angiopathy without gangrene: Secondary | ICD-10-CM | POA: Diagnosis not present

## 2021-08-06 DIAGNOSIS — M6281 Muscle weakness (generalized): Secondary | ICD-10-CM | POA: Diagnosis not present

## 2021-08-07 DIAGNOSIS — R2689 Other abnormalities of gait and mobility: Secondary | ICD-10-CM | POA: Diagnosis not present

## 2021-08-07 DIAGNOSIS — E1151 Type 2 diabetes mellitus with diabetic peripheral angiopathy without gangrene: Secondary | ICD-10-CM | POA: Diagnosis not present

## 2021-08-07 DIAGNOSIS — M6281 Muscle weakness (generalized): Secondary | ICD-10-CM | POA: Diagnosis not present

## 2021-08-07 DIAGNOSIS — R2681 Unsteadiness on feet: Secondary | ICD-10-CM | POA: Diagnosis not present

## 2021-08-08 DIAGNOSIS — E1151 Type 2 diabetes mellitus with diabetic peripheral angiopathy without gangrene: Secondary | ICD-10-CM | POA: Diagnosis not present

## 2021-08-08 DIAGNOSIS — R2689 Other abnormalities of gait and mobility: Secondary | ICD-10-CM | POA: Diagnosis not present

## 2021-08-08 DIAGNOSIS — M6281 Muscle weakness (generalized): Secondary | ICD-10-CM | POA: Diagnosis not present

## 2021-08-08 DIAGNOSIS — R2681 Unsteadiness on feet: Secondary | ICD-10-CM | POA: Diagnosis not present

## 2021-08-09 DIAGNOSIS — R2681 Unsteadiness on feet: Secondary | ICD-10-CM | POA: Diagnosis not present

## 2021-08-09 DIAGNOSIS — M6281 Muscle weakness (generalized): Secondary | ICD-10-CM | POA: Diagnosis not present

## 2021-08-09 DIAGNOSIS — R2689 Other abnormalities of gait and mobility: Secondary | ICD-10-CM | POA: Diagnosis not present

## 2021-08-09 DIAGNOSIS — I2510 Atherosclerotic heart disease of native coronary artery without angina pectoris: Secondary | ICD-10-CM | POA: Diagnosis not present

## 2021-08-09 DIAGNOSIS — F17290 Nicotine dependence, other tobacco product, uncomplicated: Secondary | ICD-10-CM | POA: Diagnosis not present

## 2021-08-09 DIAGNOSIS — E1151 Type 2 diabetes mellitus with diabetic peripheral angiopathy without gangrene: Secondary | ICD-10-CM | POA: Diagnosis not present

## 2021-08-09 DIAGNOSIS — I10 Essential (primary) hypertension: Secondary | ICD-10-CM | POA: Diagnosis not present

## 2021-08-09 DIAGNOSIS — E119 Type 2 diabetes mellitus without complications: Secondary | ICD-10-CM | POA: Diagnosis not present

## 2021-08-09 DIAGNOSIS — E785 Hyperlipidemia, unspecified: Secondary | ICD-10-CM | POA: Diagnosis not present

## 2021-08-10 DIAGNOSIS — I739 Peripheral vascular disease, unspecified: Secondary | ICD-10-CM | POA: Diagnosis not present

## 2021-08-10 DIAGNOSIS — I714 Abdominal aortic aneurysm, without rupture, unspecified: Secondary | ICD-10-CM | POA: Diagnosis not present

## 2021-08-10 DIAGNOSIS — I10 Essential (primary) hypertension: Secondary | ICD-10-CM | POA: Diagnosis not present

## 2021-08-10 DIAGNOSIS — R2689 Other abnormalities of gait and mobility: Secondary | ICD-10-CM | POA: Diagnosis not present

## 2021-08-10 DIAGNOSIS — J9690 Respiratory failure, unspecified, unspecified whether with hypoxia or hypercapnia: Secondary | ICD-10-CM | POA: Diagnosis not present

## 2021-08-10 DIAGNOSIS — R2681 Unsteadiness on feet: Secondary | ICD-10-CM | POA: Diagnosis not present

## 2021-08-10 DIAGNOSIS — N183 Chronic kidney disease, stage 3 unspecified: Secondary | ICD-10-CM | POA: Diagnosis not present

## 2021-08-10 DIAGNOSIS — M6281 Muscle weakness (generalized): Secondary | ICD-10-CM | POA: Diagnosis not present

## 2021-08-10 DIAGNOSIS — F17200 Nicotine dependence, unspecified, uncomplicated: Secondary | ICD-10-CM | POA: Diagnosis not present

## 2021-08-10 DIAGNOSIS — E1151 Type 2 diabetes mellitus with diabetic peripheral angiopathy without gangrene: Secondary | ICD-10-CM | POA: Diagnosis not present

## 2021-08-10 DIAGNOSIS — E118 Type 2 diabetes mellitus with unspecified complications: Secondary | ICD-10-CM | POA: Diagnosis not present

## 2021-08-11 DIAGNOSIS — R2689 Other abnormalities of gait and mobility: Secondary | ICD-10-CM | POA: Diagnosis not present

## 2021-08-11 DIAGNOSIS — R2681 Unsteadiness on feet: Secondary | ICD-10-CM | POA: Diagnosis not present

## 2021-08-11 DIAGNOSIS — M6281 Muscle weakness (generalized): Secondary | ICD-10-CM | POA: Diagnosis not present

## 2021-08-11 DIAGNOSIS — E1151 Type 2 diabetes mellitus with diabetic peripheral angiopathy without gangrene: Secondary | ICD-10-CM | POA: Diagnosis not present

## 2021-08-12 ENCOUNTER — Emergency Department (HOSPITAL_COMMUNITY): Payer: Medicare Other

## 2021-08-12 ENCOUNTER — Encounter (HOSPITAL_COMMUNITY): Payer: Self-pay | Admitting: Emergency Medicine

## 2021-08-12 ENCOUNTER — Other Ambulatory Visit: Payer: Self-pay

## 2021-08-12 ENCOUNTER — Observation Stay (HOSPITAL_COMMUNITY)
Admission: EM | Admit: 2021-08-12 | Discharge: 2021-08-14 | Disposition: A | Discharge status: Skilled Nursing Facility | Payer: Medicare Other | Attending: Internal Medicine | Admitting: Internal Medicine

## 2021-08-12 DIAGNOSIS — Z79899 Other long term (current) drug therapy: Secondary | ICD-10-CM | POA: Diagnosis not present

## 2021-08-12 DIAGNOSIS — Z794 Long term (current) use of insulin: Secondary | ICD-10-CM | POA: Diagnosis not present

## 2021-08-12 DIAGNOSIS — Z20822 Contact with and (suspected) exposure to covid-19: Secondary | ICD-10-CM | POA: Insufficient documentation

## 2021-08-12 DIAGNOSIS — C3490 Malignant neoplasm of unspecified part of unspecified bronchus or lung: Secondary | ICD-10-CM | POA: Diagnosis present

## 2021-08-12 DIAGNOSIS — I25119 Atherosclerotic heart disease of native coronary artery with unspecified angina pectoris: Secondary | ICD-10-CM | POA: Diagnosis not present

## 2021-08-12 DIAGNOSIS — D638 Anemia in other chronic diseases classified elsewhere: Secondary | ICD-10-CM | POA: Insufficient documentation

## 2021-08-12 DIAGNOSIS — I700 Atherosclerosis of aorta: Secondary | ICD-10-CM | POA: Insufficient documentation

## 2021-08-12 DIAGNOSIS — Z7982 Long term (current) use of aspirin: Secondary | ICD-10-CM | POA: Insufficient documentation

## 2021-08-12 DIAGNOSIS — R079 Chest pain, unspecified: Secondary | ICD-10-CM | POA: Diagnosis not present

## 2021-08-12 DIAGNOSIS — Z515 Encounter for palliative care: Secondary | ICD-10-CM | POA: Insufficient documentation

## 2021-08-12 DIAGNOSIS — N184 Chronic kidney disease, stage 4 (severe): Secondary | ICD-10-CM | POA: Insufficient documentation

## 2021-08-12 DIAGNOSIS — Z87891 Personal history of nicotine dependence: Secondary | ICD-10-CM | POA: Diagnosis not present

## 2021-08-12 DIAGNOSIS — E875 Hyperkalemia: Secondary | ICD-10-CM | POA: Insufficient documentation

## 2021-08-12 DIAGNOSIS — Z8673 Personal history of transient ischemic attack (TIA), and cerebral infarction without residual deficits: Secondary | ICD-10-CM | POA: Diagnosis not present

## 2021-08-12 DIAGNOSIS — R0602 Shortness of breath: Secondary | ICD-10-CM | POA: Diagnosis not present

## 2021-08-12 DIAGNOSIS — I10 Essential (primary) hypertension: Secondary | ICD-10-CM | POA: Diagnosis not present

## 2021-08-12 DIAGNOSIS — Z96642 Presence of left artificial hip joint: Secondary | ICD-10-CM | POA: Diagnosis not present

## 2021-08-12 DIAGNOSIS — C3491 Malignant neoplasm of unspecified part of right bronchus or lung: Secondary | ICD-10-CM | POA: Insufficient documentation

## 2021-08-12 DIAGNOSIS — I129 Hypertensive chronic kidney disease with stage 1 through stage 4 chronic kidney disease, or unspecified chronic kidney disease: Secondary | ICD-10-CM | POA: Insufficient documentation

## 2021-08-12 DIAGNOSIS — E1159 Type 2 diabetes mellitus with other circulatory complications: Secondary | ICD-10-CM | POA: Diagnosis present

## 2021-08-12 DIAGNOSIS — E785 Hyperlipidemia, unspecified: Secondary | ICD-10-CM | POA: Insufficient documentation

## 2021-08-12 DIAGNOSIS — J439 Emphysema, unspecified: Secondary | ICD-10-CM | POA: Insufficient documentation

## 2021-08-12 DIAGNOSIS — C3481 Malignant neoplasm of overlapping sites of right bronchus and lung: Secondary | ICD-10-CM | POA: Diagnosis not present

## 2021-08-12 DIAGNOSIS — N183 Chronic kidney disease, stage 3 unspecified: Secondary | ICD-10-CM | POA: Diagnosis present

## 2021-08-12 DIAGNOSIS — J910 Malignant pleural effusion: Secondary | ICD-10-CM | POA: Diagnosis not present

## 2021-08-12 DIAGNOSIS — E1122 Type 2 diabetes mellitus with diabetic chronic kidney disease: Secondary | ICD-10-CM | POA: Diagnosis not present

## 2021-08-12 DIAGNOSIS — J9601 Acute respiratory failure with hypoxia: Principal | ICD-10-CM | POA: Insufficient documentation

## 2021-08-12 DIAGNOSIS — Z96651 Presence of right artificial knee joint: Secondary | ICD-10-CM | POA: Diagnosis not present

## 2021-08-12 DIAGNOSIS — C782 Secondary malignant neoplasm of pleura: Secondary | ICD-10-CM | POA: Insufficient documentation

## 2021-08-12 DIAGNOSIS — Z951 Presence of aortocoronary bypass graft: Secondary | ICD-10-CM | POA: Diagnosis not present

## 2021-08-12 DIAGNOSIS — Z7902 Long term (current) use of antithrombotics/antiplatelets: Secondary | ICD-10-CM | POA: Insufficient documentation

## 2021-08-12 DIAGNOSIS — C779 Secondary and unspecified malignant neoplasm of lymph node, unspecified: Secondary | ICD-10-CM | POA: Insufficient documentation

## 2021-08-12 DIAGNOSIS — Z66 Do not resuscitate: Secondary | ICD-10-CM | POA: Insufficient documentation

## 2021-08-12 DIAGNOSIS — I959 Hypotension, unspecified: Secondary | ICD-10-CM | POA: Diagnosis not present

## 2021-08-12 DIAGNOSIS — R0789 Other chest pain: Secondary | ICD-10-CM | POA: Diagnosis not present

## 2021-08-12 DIAGNOSIS — J90 Pleural effusion, not elsewhere classified: Secondary | ICD-10-CM | POA: Diagnosis not present

## 2021-08-12 LAB — COMPREHENSIVE METABOLIC PANEL
ALT: 9 U/L (ref 0–44)
AST: 20 U/L (ref 15–41)
Albumin: 3.4 g/dL — ABNORMAL LOW (ref 3.5–5.0)
Alkaline Phosphatase: 47 U/L (ref 38–126)
Anion gap: 10 (ref 5–15)
BUN: 34 mg/dL — ABNORMAL HIGH (ref 8–23)
CO2: 18 mmol/L — ABNORMAL LOW (ref 22–32)
Calcium: 9.1 mg/dL (ref 8.9–10.3)
Chloride: 108 mmol/L (ref 98–111)
Creatinine, Ser: 2.94 mg/dL — ABNORMAL HIGH (ref 0.44–1.00)
GFR, Estimated: 15 mL/min — ABNORMAL LOW (ref >60)
Glucose, Bld: 132 mg/dL — ABNORMAL HIGH (ref 70–99)
Potassium: 5.2 mmol/L — ABNORMAL HIGH (ref 3.5–5.1)
Sodium: 136 mmol/L (ref 135–145)
Total Bilirubin: 0.4 mg/dL (ref 0.3–1.2)
Total Protein: 7.6 g/dL (ref 6.5–8.1)

## 2021-08-12 LAB — CBC WITH DIFFERENTIAL/PLATELET
Abs Immature Granulocytes: 0.08 10*3/uL — ABNORMAL HIGH (ref 0.00–0.07)
Basophils Absolute: 0.1 10*3/uL (ref 0.0–0.1)
Basophils Relative: 1 %
Eosinophils Absolute: 0.1 10*3/uL (ref 0.0–0.5)
Eosinophils Relative: 1 %
HCT: 28.8 % — ABNORMAL LOW (ref 36.0–46.0)
Hemoglobin: 9.3 g/dL — ABNORMAL LOW (ref 12.0–15.0)
Immature Granulocytes: 1 %
Lymphocytes Relative: 16 %
Lymphs Abs: 1.4 10*3/uL (ref 0.7–4.0)
MCH: 29.2 pg (ref 26.0–34.0)
MCHC: 32.3 g/dL (ref 30.0–36.0)
MCV: 90.3 fL (ref 80.0–100.0)
Monocytes Absolute: 0.7 10*3/uL (ref 0.1–1.0)
Monocytes Relative: 8 %
Neutro Abs: 6.4 10*3/uL (ref 1.7–7.7)
Neutrophils Relative %: 73 %
Platelets: 189 10*3/uL (ref 150–400)
RBC: 3.19 MIL/uL — ABNORMAL LOW (ref 3.87–5.11)
RDW: 14.8 % (ref 11.5–15.5)
WBC: 8.7 10*3/uL (ref 4.0–10.5)
nRBC: 0 % (ref 0.0–0.2)

## 2021-08-12 LAB — RESP PANEL BY RT-PCR (FLU A&B, COVID) ARPGX2
Influenza A by PCR: NEGATIVE
Influenza B by PCR: NEGATIVE
SARS Coronavirus 2 by RT PCR: NEGATIVE

## 2021-08-12 LAB — HEMOGLOBIN A1C
Hgb A1c MFr Bld: 5.8 % — ABNORMAL HIGH (ref 4.8–5.6)
Mean Plasma Glucose: 119.76 mg/dL

## 2021-08-12 LAB — CBG MONITORING, ED: Glucose-Capillary: 105 mg/dL — ABNORMAL HIGH (ref 70–99)

## 2021-08-12 LAB — TROPONIN I (HIGH SENSITIVITY)
Troponin I (High Sensitivity): 17 ng/L (ref <18)
Troponin I (High Sensitivity): 15 ng/L (ref <18)

## 2021-08-12 LAB — GLUCOSE, CAPILLARY: Glucose-Capillary: 97 mg/dL (ref 70–99)

## 2021-08-12 LAB — LIPASE, BLOOD: Lipase: 29 U/L (ref 11–51)

## 2021-08-12 MED ORDER — ALBUTEROL SULFATE (2.5 MG/3ML) 0.083% IN NEBU: 2.5000 mg | INHALATION_SOLUTION | RESPIRATORY_TRACT | Status: DC | PRN

## 2021-08-12 MED ORDER — CLOPIDOGREL BISULFATE 75 MG PO TABS
75.0000 mg | ORAL_TABLET | Freq: Every day | ORAL | Status: DC
  Administered 2021-08-13: 75 mg via ORAL
  Filled 2021-08-12: qty 1

## 2021-08-12 MED ORDER — METOPROLOL SUCCINATE ER 25 MG PO TB24
25.0000 mg | ORAL_TABLET | Freq: Every day | ORAL | Status: DC
  Administered 2021-08-13 – 2021-08-14 (×2): 25 mg via ORAL
  Filled 2021-08-12 (×2): qty 1

## 2021-08-12 MED ORDER — ACETAMINOPHEN 650 MG RE SUPP
650.0000 mg | Freq: Four times a day (QID) | RECTAL | Status: DC | PRN
Start: 2021-08-12 — End: 2021-08-14

## 2021-08-12 MED ORDER — ONDANSETRON HCL 4 MG PO TABS: 4.0000 mg | ORAL_TABLET | Freq: Four times a day (QID) | ORAL | Status: DC | PRN

## 2021-08-12 MED ORDER — FAMOTIDINE 20 MG PO TABS
20.0000 mg | ORAL_TABLET | Freq: Every day | ORAL | Status: DC
  Administered 2021-08-13 – 2021-08-14 (×2): 20 mg via ORAL
  Filled 2021-08-12 (×2): qty 1

## 2021-08-12 MED ORDER — FENTANYL CITRATE PF 50 MCG/ML IJ SOSY
50.0000 ug | PREFILLED_SYRINGE | Freq: Once | INTRAMUSCULAR | Status: AC
  Administered 2021-08-12: 50 ug via INTRAVENOUS
  Filled 2021-08-12: qty 1

## 2021-08-12 MED ORDER — AMLODIPINE BESYLATE 10 MG PO TABS
10.0000 mg | ORAL_TABLET | Freq: Every day | ORAL | Status: DC
  Administered 2021-08-13 – 2021-08-14 (×2): 10 mg via ORAL
  Filled 2021-08-12 (×2): qty 1

## 2021-08-12 MED ORDER — ROSUVASTATIN CALCIUM 5 MG PO TABS
10.0000 mg | ORAL_TABLET | Freq: Every day | ORAL | Status: DC
  Administered 2021-08-12 – 2021-08-13 (×2): 10 mg via ORAL
  Filled 2021-08-12 (×2): qty 2

## 2021-08-12 MED ORDER — ASPIRIN EC 81 MG PO TBEC
81.0000 mg | DELAYED_RELEASE_TABLET | Freq: Every day | ORAL | Status: DC
Start: 2021-08-13 — End: 2021-08-14
  Administered 2021-08-13 – 2021-08-14 (×2): 81 mg via ORAL
  Filled 2021-08-12 (×2): qty 1

## 2021-08-12 MED ORDER — ISOSORBIDE MONONITRATE ER 60 MG PO TB24
60.0000 mg | ORAL_TABLET | Freq: Every day | ORAL | Status: DC
  Administered 2021-08-13 – 2021-08-14 (×2): 60 mg via ORAL
  Filled 2021-08-12 (×2): qty 1

## 2021-08-12 MED ORDER — ONDANSETRON HCL 4 MG/2ML IJ SOLN
4.0000 mg | Freq: Four times a day (QID) | INTRAMUSCULAR | Status: DC | PRN
  Administered 2021-08-14: 4 mg via INTRAVENOUS
  Filled 2021-08-12: qty 2

## 2021-08-12 MED ORDER — INSULIN ASPART 100 UNIT/ML IJ SOLN
0.0000 [IU] | INTRAMUSCULAR | Status: DC
  Administered 2021-08-14: 1 [IU] via SUBCUTANEOUS

## 2021-08-12 MED ORDER — HYDROMORPHONE HCL 1 MG/ML IJ SOLN
0.5000 mg | INTRAMUSCULAR | Status: DC | PRN
  Administered 2021-08-14: 1 mg via INTRAVENOUS
  Filled 2021-08-12: qty 1

## 2021-08-12 MED ORDER — NICOTINE 7 MG/24HR TD PT24
7.0000 mg | MEDICATED_PATCH | Freq: Every day | TRANSDERMAL | Status: DC
  Administered 2021-08-13 – 2021-08-14 (×2): 7 mg via TRANSDERMAL
  Filled 2021-08-12 (×2): qty 1

## 2021-08-12 MED ORDER — ACETAMINOPHEN 325 MG PO TABS
650.0000 mg | ORAL_TABLET | Freq: Four times a day (QID) | ORAL | Status: DC | PRN
  Administered 2021-08-13 – 2021-08-14 (×3): 650 mg via ORAL
  Filled 2021-08-12 (×3): qty 2

## 2021-08-12 NOTE — H&P (Signed)
?History and Physical  ? ? ?Patient: [name] PPJ:[id number] DOB: 1940-10-01 ?DOA: 08/12/2021 ?DOS: the patient was seen and examined on 08/12/2021 ?PCP: [name], MD  ?Patient coming from: Home ? ?Chief Complaint:  ?Chief Complaint  ?Patient presents with  ? Chest Pain  ? ?HPI: [name] is a 81 y.o. female with medical history significant of CAD s/p CABG, DM2, HTN, stroke, smoking. ? ?Pt presents to ED with c/o CP onset yesterday, became constant this AM. ? ?Having increasing SOB for past 1 week. ? ?Having wt loss for past couple of months per daughter. ? ?SOB worse with activity. ? ?Pain radiates to back and neck. ? ?325 ASA with EMS. ? ?Recently started on 2L O2 at home due to SOB.  ?Review of Systems: As mentioned in the history of present illness. All other systems reviewed and are negative. ?Past Medical History:  ?Diagnosis Date  ? AAA (abdominal aortic aneurysm)   ? Arthritis   ? osteoarthritis  ? Constipation   ? 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 GERD (gastroesophageal reflux disease)   ? Glaucoma   ? Hyperlipidemia   ? Hypertension   ? Kidney stones   ? Myocardial infarction [facility])   ? Peripheral vascular disease ([place])   ? Pneumonia   ? Stroke ([place]) 01/28/2015  ? with left side weakness   ? TIA (transient ischemic attack)   ? Urinary incontinence   ? ?Past Surgical History:  ?Procedure Laterality Date  ? ABDOMINAL AORTAGRAM N/A 03/13/2014  ? Procedure: ABDOMINAL AORTAGRAM;  Surgeon: Elam Dutch, MD;  Location: [facility] CATH LAB;  Service: Cardiovascular;  Laterality: N/A;  ? ABDOMINAL AORTIC ANEURYSM REPAIR  ?2012  ? ABDOMINAL HYSTERECTOMY    ? partial  ? BACK SURGERY    ? CARDIAC CATHETERIZATION    ? CORONARY ANGIOPLASTY  03/30/2015  ? CORONARY ARTERY BYPASS GRAFT N/A 01/17/2021  ? Procedure: CORONARY ARTERY BYPASS GRAFTING (CABG) TIMES 3 , ON PUMP, USING LEFT INTERNAL MAMMARY ARTERY AND ENDOSCOPICALLY HARVESTED RIGHT GREATER SAPHENOUS VEIN;  Surgeon: Lajuana Matte, MD;  Location: [place];  Service: Open Heart Surgery;  Laterality: N/A;  ? ENDOVEIN HARVEST OF GREATER SAPHENOUS VEIN Right 01/17/2021  ? Procedure: ENDOVEIN HARVEST OF GREATER SAPHENOUS VEIN;  Surgeon: Lajuana Matte, MD;  Location: [place];  Service: Open Heart Surgery;  Laterality: Right;  ? EYE SURGERY Right   ? laser surgery for blood behind eye, loss of sight  ? FEMORAL-POPLITEAL BYPASS GRAFT Left 07/06/2014  ? Procedure: BYPASS GRAFT FEMORAL-POPLITEAL ARTERY;  Surgeon: Elam Dutch, MD;  Location: [place];  Service: Vascular;  Laterality: Left;  ? HERNIA REPAIR    ? JOINT REPLACEMENT    ? LEFT HEART CATH AND CORONARY ANGIOGRAPHY N/A 01/11/2021  ? Procedure: LEFT HEART CATH AND CORONARY ANGIOGRAPHY;  Surgeon: [name], MD;  Location: [place] CV LAB;  Service: Cardiovascular;  Laterality: N/A;  ? LEFT HEART CATHETERIZATION WITH CORONARY ANGIOGRAM N/A 04/12/2014  ? Procedure: LEFT HEART CATHETERIZATION WITH CORONARY ANGIOGRAM;  Surgeon: Clent Demark, MD;  Location: [facility] CATH LAB;  Service: Cardiovascular;  Laterality: N/A;  ? PERCUTANEOUS CORONARY STENT INTERVENTION (PCI-S)  04/12/2014  ? Procedure: PERCUTANEOUS CORONARY STENT INTERVENTION (PCI-S);  Surgeon: Clent Demark, MD;  Location: [facility] CATH LAB;  Service: Cardiovascular;;  prox and mid RCA  ? PERIPHERAL VASCULAR INTERVENTION  06/25/2019  ? Procedure: PERIPHERAL VASCULAR INTERVENTION;  Surgeon: Elam Dutch, MD;  Location: [facility]  INVASIVE CV LAB;  Service: Cardiovascular;;  Lt Renal and Rt Renal  ? RENAL ANGIOGRAPHY Bilateral 06/25/2019  ? Procedure: RENAL ANGIOGRAPHY;  Surgeon: Elam Dutch, MD;  Location: [place] CV LAB;  Service: Cardiovascular;  Laterality: Bilateral;  ? TEE WITHOUT CARDIOVERSION N/A 01/17/2021  ? Procedure: TRANSESOPHAGEAL ECHOCARDIOGRAM (TEE);  Surgeon: Lajuana Matte, MD;  Location: [place];  Service: Open Heart Surgery;  Laterality: N/A;  ? TOTAL HIP ARTHROPLASTY Left 05/18/2015  ? Procedure:  LEFT TOTAL HIP ARTHROPLASTY ANTERIOR APPROACH;  Surgeon: Mcarthur Rossetti, MD;  Location: [place];  Service: Orthopedics;  Laterality: Left;  ? TOTAL KNEE ARTHROPLASTY Left ~ 2003  ? TUBAL LIGATION    ? uterine tumor    ? VENTRAL HERNIA REPAIR    ? ?Social History:  reports that she has quit smoking. Her smoking use included cigarettes. She has a 20.00 pack-year smoking history. She has never used smokeless tobacco. She reports that she does not drink alcohol and does not use drugs. ? ?Allergies  ?Allergen Reactions  ? Keflex [Cephalexin] Shortness Of Breath and Other (See Comments)  ?  Chest pain, headache  ? Lipitor [Atorvastatin] Shortness Of Breath and Other (See Comments)  ?  Severe pain/cramping in legs- "I cannot walk, talk, or breathe" (pt is currently taking 20 mg daily 10/11/19)  ? Codeine Other (See Comments)  ?  Disrupted patient's equilibrium- "Made my world flip upside down"  ? Hydrochlorothiazide Other (See Comments)  ?  Other reaction(s): lightheaded, nausea  ? Hydrocodone-Acetaminophen Other (See Comments)  ?  Other reaction(s): "get crazy"  ? Metformin Hcl Other (See Comments)  ?  Other reaction(s): upset stomach  ? Tramadol Hcl Other (See Comments)  ?  Other reaction(s): "get crazy"  ? Azor [Amlodipine-Olmesartan] Swelling, Palpitations and Rash  ?  Amlodipine alone resumed 12/2013, tolerating  ? Lisinopril Cough  ? Penicillins Dermatitis and Other (See Comments)  ?  Did it involve swelling of the face/tongue/throat, SOB, or low BP? No, just worsened eczema ?Did it involve sudden or severe rash/hives, skin peeling, or any reaction on the inside of your mouth or nose? No ?Did you need to seek medical attention at a hospital or doctor's office? No ?When did it last happen? "More than 10 years ago" ?If all above answers are ?NO?, may proceed with cephalosporin use. ?  ? ? ?Family History  ?Problem Relation Age of Onset  ? Diabetes Mother   ? Stroke Mother   ? Hypertension Father   ? Stroke Father    ? Hyperlipidemia Sister   ? Hypertension Sister   ? Aneurysm Sister   ? Hyperlipidemia Brother   ? Hypertension Brother   ? ? ?Prior to Admission medications   ?Medication Sig Start Date End Date Taking? Authorizing Provider  ?acetaminophen (TYLENOL) 650 MG CR tablet Take 650 mg by mouth at bedtime.   Yes [provider]  ?albuterol (PROVENTIL HFA;VENTOLIN HFA) 108 (90 Base) MCG/ACT inhaler Inhale 2 puffs into the lungs every 4 (four) hours as needed for wheezing or shortness of breath (or coughing). ?Patient taking differently: Inhale 2 puffs into the lungs 2 (two) times daily. And every 4 hours as needed for wheezing 4/85/46  Yes Delora Fuel, MD  ?amLODipine (NORVASC) 10 MG tablet Take 10 mg by mouth daily. 09/01/19  Yes [provider]  ?aspirin EC 81 MG EC tablet Take 1 tablet (81 mg total) by mouth daily. Swallow whole. 01/28/21  Yes Antony Odea, PA-C  ?bisacodyl (  DULCOLAX) 10 MG suppository Place 10 mg rectally daily as needed for moderate constipation.   Yes [provider]  ?clopidogrel (PLAVIX) 75 MG tablet Take 1 tablet (75 mg total) by mouth daily. 01/28/21  Yes Roddenberry, Arlis Porta, PA-C  ?famotidine (PEPCID) 20 MG tablet Take 20 mg by mouth daily.   Yes [provider]  ?Fluticasone-Umeclidin-Vilant (TRELEGY ELLIPTA) 200-62.5-25 MCG/ACT AEPB Inhale 1 puff into the lungs daily.   Yes [provider]  ?furosemide (LASIX) 20 MG tablet Take 20 mg by mouth daily.   Yes [provider]  ?hydrALAZINE (APRESOLINE) 50 MG tablet Take 1 tablet (50 mg total) by mouth every 8 (eight) hours. ?Patient taking differently: Take 100 mg by mouth 2 (two) times daily. 03/22/21  Yes Thurnell Lose, MD  ?insulin aspart (NOVOLOG) 100 UNIT/ML FlexPen Before each meal 3 times a day, 140-199 - 2 units, 200-250 - 4 units, 251-299 - 6 units,  300-349 - 8 units,  350 or above 10 units. ?Insulin PEN if approved, provide syringes and needles if needed. ?Patient taking  differently: 2-10 Units 2 (two) times daily. Before each meal, 140-199 - 2 units, 200-250 - 4 units, 251-299 - 6 units,  300-349 - 8 units,  350 or above 10 units. ?I 03/22/21  Yes Thurnell Lose, MD  ?is

## 2021-08-12 NOTE — Assessment & Plan Note (Signed)
Sensitive SSI Q4H while NPO ?

## 2021-08-12 NOTE — Assessment & Plan Note (Signed)
Cont BB, imdur, norvasc. ?BP a bit on the soft side (144 systolic), will hold hydralazine for the moment. ?

## 2021-08-12 NOTE — ED Provider Triage Note (Signed)
Emergency Medicine Provider Triage Evaluation Note ? ?[name] , a 81 y.o. female  was evaluated in triage.  Pt complains of shortness of breath since yesterday.  Worse with exertion.  She has right-sided chest pain radiating to her right sided shoulder and right sided "side" pain no fevers. ? ? ? ?Physical Exam  ?BP (!) 128/57 (BP Location: Left Arm)   Pulse 78   Temp 98.2 ?F (36.8 ?C) (Oral)   Resp 18   SpO2 97%  ?Gen:   Awake, no distress   ?Resp:  Normal effort  ?MSK:   Moves extremities without difficulty  ?Other:  Lung sounds present bilaterally.  No increased work of breathing ? ?Medical Decision Making  ?Medically screening exam initiated at 3:46 PM.  Appropriate orders placed.  [name] was informed that the remainder of the evaluation will be completed by another provider, this initial triage assessment does not replace that evaluation, and the importance of remaining in the ED until their evaluation is complete. ? ? ?  ?Lorin Glass, PA-C ?08/12/21 1547 ? ?

## 2021-08-12 NOTE — Assessment & Plan Note (Addendum)
Having CP today and certianly pt with recent coronary dz, NSTEMIs, CABG in Aug 22, etc. ?However, think much of her CP and SOB over past week may be related to the new findings of lung cancer, malignant pleural effusion, etc. ?Tele monitor ?Trend trops ?Cont home meds including asa + plavix ?

## 2021-08-12 NOTE — Assessment & Plan Note (Addendum)
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 ?Creat of 2.9 today is worse than her prior baseline, but better than the DC at end of October. ?10:1 BUN to Cr ratio doesn't appear particularly pre-renal. ?Hold lasix. ?May want to get nephrology consult in AM. ?

## 2021-08-12 NOTE — ED Provider Notes (Signed)
?[place] ?Provider Note ? ? ?CSN: [id number] ?Arrival date & time: 08/12/21  1454 ? ?  ? ?History ? ?Chief Complaint  ?Patient presents with  ? Chest Pain  ? ? ?[name] is a 81 y.o. female coming in for progressively worsening shortness of breath.  Patient reports for the last week she is felt very tired and fatigued.  Daughter reports 6 about a brief loss recently.  Patient was seen and started on oxygen last Friday due to hypoxia.  Patient decided to come into the ED today because she was feeling even worse and more short of breath.  Denies any fevers, chills, nausea, vomiting, abdominal pain, diarrhea, or chest pain. ? ?Two sisters with breast cancer.  Per daughter patient has had 2 sons die within the last 3 years.  Son died pancreatic. Son died of colon cancer.  ? ? ? ?Chest Pain ? ?  ? ?Home Medications ?Prior to Admission medications   ?Medication Sig Start Date End Date Taking? Authorizing Provider  ?acetaminophen (TYLENOL) 650 MG CR tablet Take 650 mg by mouth at bedtime as needed for pain.     [provider]  ?albuterol (PROVENTIL HFA;VENTOLIN HFA) 108 (90 Base) MCG/ACT inhaler Inhale 2 puffs into the lungs every 4 (four) hours as needed for wheezing or shortness of breath (or coughing). 09/13/38   Delora Fuel, MD  ?amLODipine (NORVASC) 10 MG tablet Take 10 mg by mouth daily. 09/01/19   [provider]  ?aspirin EC 81 MG EC tablet Take 1 tablet (81 mg total) by mouth daily. Swallow whole. 01/28/21   Antony Odea, PA-C  ?busPIRone (BUSPAR) 5 MG tablet Take 1 tablet (5 mg total) by mouth 2 (two) times daily. 10/15/19   Georgette Shell, MD  ?carvedilol (COREG) 6.25 MG tablet Take 1 tablet (6.25 mg total) by mouth 2 (two) times daily with a meal. 03/22/21   Thurnell Lose, MD  ?clopidogrel (PLAVIX) 75 MG tablet Take 1 tablet (75 mg total) by mouth daily. 01/28/21   Antony Odea, PA-C  ?famotidine (PEPCID) 10 MG tablet Take 10  mg by mouth 2 (two) times daily.    [provider]  ?feeding supplement (ENSURE ENLIVE / ENSURE PLUS) LIQD Take 237 mLs by mouth 3 (three) times daily between meals. 03/22/21   Thurnell Lose, MD  ?hydrALAZINE (APRESOLINE) 50 MG tablet Take 1 tablet (50 mg total) by mouth every 8 (eight) hours. 03/22/21   Thurnell Lose, MD  ?insulin aspart (NOVOLOG) 100 UNIT/ML FlexPen Before each meal 3 times a day, 140-199 - 2 units, 200-250 - 4 units, 251-299 - 6 units,  300-349 - 8 units,  350 or above 10 units. ?Insulin PEN if approved, provide syringes and needles if needed. 03/22/21   Thurnell Lose, MD  ?isosorbide mononitrate (IMDUR) 60 MG 24 hr tablet Take 1 tablet (60 mg total) by mouth daily. 03/23/21   Thurnell Lose, MD  ?Menthol, Topical Analgesic, (BIOFREEZE EX) Apply topically. Apply topically to affected areas three times daily as needed for pain.    [provider]  ?pantoprazole (PROTONIX) 40 MG tablet Take 1 tablet (40 mg total) by mouth daily. 01/28/21   Antony Odea, PA-C  ?rosuvastatin (CRESTOR) 10 MG tablet Take 1 tablet (10 mg total) by mouth at bedtime. 01/27/21   Antony Odea, PA-C  ?simethicone (MYLICON) 814 MG chewable tablet Chew 125 mg by mouth in the morning and at bedtime.  [provider]  ?varenicline (CHANTIX) 0.5 MG tablet Take 0.5 mg by mouth 2 (two) times daily.    [provider]  ?vitamin B-12 (CYANOCOBALAMIN) 250 MCG tablet Take 1 tablet (250 mcg total) by mouth daily. 01/28/21   Antony Odea, PA-C  ?   ? ?Allergies    ?Keflex [cephalexin], Lipitor [atorvastatin], Codeine, Hydrochlorothiazide, Hydrocodone-acetaminophen, Metformin hcl, Tramadol hcl, Azor [amlodipine-olmesartan], Lisinopril, and Penicillins   ? ?Review of Systems   ?Review of Systems  ?Cardiovascular:  Positive for chest pain.  ? ?Physical Exam ?Updated Vital Signs ?BP (!) 110/55 (BP Location: Left Arm)   Pulse 77   Temp 98.2 ?F (36.8 ?C) (Oral)   Resp  16   SpO2 96%  ?Physical Exam ?Constitutional:   ?   Comments: Frail  ?Cardiovascular:  ?   Rate and Rhythm: Normal rate and regular rhythm.  ?Pulmonary:  ?   Breath sounds: Examination of the right-upper field reveals decreased breath sounds. Examination of the right-middle field reveals decreased breath sounds. Examination of the right-lower field reveals decreased breath sounds. Decreased breath sounds present.  ?   Comments: Dullness to percussion of right lower lobe, tactile fremitus noted ?Chest:  ?   Chest wall: No tenderness or crepitus. There is dullness to percussion.  ?Musculoskeletal:  ?   Right lower leg: No edema.  ?   Left lower leg: No edema.  ?Skin: ?   General: Skin is warm and dry.  ?Neurological:  ?   Mental Status: She is alert.  ? ? ?ED Results / Procedures / Treatments   ?Labs ?(all labs ordered are listed, but only abnormal results are displayed) ?Labs Reviewed  ?COMPREHENSIVE METABOLIC PANEL - Abnormal; Notable for the following components:  ?    Result Value  ? Potassium 5.2 (*)   ? CO2 18 (*)   ? Glucose, Bld 132 (*)   ? BUN 34 (*)   ? Creatinine, Ser 2.94 (*)   ? Albumin 3.4 (*)   ? GFR, Estimated 15 (*)   ? All other components within normal limits  ?CBC WITH DIFFERENTIAL/PLATELET - Abnormal; Notable for the following components:  ? RBC 3.19 (*)   ? Hemoglobin 9.3 (*)   ? HCT 28.8 (*)   ? Abs Immature Granulocytes 0.08 (*)   ? All other components within normal limits  ?RESP PANEL BY RT-PCR (FLU A&B, COVID) ARPGX2  ?LIPASE, BLOOD  ?TROPONIN I (HIGH SENSITIVITY)  ? ? ?EKG ?None ? ?Radiology ?DG Chest 2 View ? ?Result Date: 08/12/2021 ?CLINICAL DATA:  Intermittent chest pain. EXAM: CHEST - 2 VIEW COMPARISON:  March 18, 2021 FINDINGS: Multiple sternal wires and vascular clips are seen. Marked severity airspace disease is seen within the mid and lower right lung. A large right pleural effusion is also present. No pneumothorax is identified. The heart size and mediastinal contours are  within normal limits. There is moderate to marked severity calcification of the thoracic aorta. The visualized skeletal structures are unremarkable. IMPRESSION: 1. Evidence of prior median sternotomy/CABG. 2. Marked severity right lung infiltrates with large right pleural effusion. Follow-up to resolution is recommended, as an underlying neoplastic process cannot be excluded. Electronically Signed   By: Virgina Norfolk M.D.   On: 08/12/2021 16:48   ? ?Procedures ?Procedures  ? ?Medications Ordered in ED ?Medications - No data to display ? ?ED Course/ Medical Decision Making/ A&P ?  ?                        ?  Medical Decision Making ?Amount and/or Complexity of Data Reviewed ?Radiology: ordered. ? ?Risk ?Prescription drug management. ?Decision regarding hospitalization. ? ? ? ? ?57 yoF presenting for progressively worsening shortness of breath.  On chart review, past medical history includes CAD s/p CABG (01/2021), HTN, IIDM, CKD stge IIIb, HLD, AAA, and admission in October of last year for altered mentation and hypoglycemia. ? ?Patient in no acute distress on arrival.  Hemodynamically stable.  Labs remarkable for potassium 5.2, CO2 of 18, glucose of 132, creatinine of 2.94, albumin of 3.4, GFR 15.  Lipase and troponin within normal limits.  Troponin of 17.  CBC with a hemoglobin of 9.3 with a normal MCV of 90.  Chest x-ray on my review shows a large right pleural effusion which is confirmed by radiology.  CT of her chest obtained to better differentiate the pleural effusion.  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  Pleural metastasis noted and possible invasion into the mediastinum.  Patient's right lower lobe is completely collapsed.  Patient and daughter both informed of findings.  Patient has been stable on 2 L of oxygen.  Case discussed with hospitalist who agrees with admission.  Care transition to the hospitalist. ? ?Patient  seen in conjunction with my attending Dr. Sabra Heck. ? ? ?Final Clinical Impression(s) / ED Diagnoses ?Final diagnoses:  ?None  ? ? ?Rx / DC Orders ?ED Discharge Orders   ? ? None  ? ?  ? ? ?  ?Lupita Dawn, MD ?0

## 2021-08-12 NOTE — Assessment & Plan Note (Addendum)
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 ?With tumor filling RML, pleural mets, malignant pleural effusion, collapse of RLL, and lymphangitic carcinomatosis of RUL. ?1. IR thoracentesis ordered for AM, hope that this: ?1. Improves breathing ?2. Provides tissue for diagnosis ?2. NPO after MN ?3. Dilaudid PRN chest pain ?4. May need onc and/or pulm consults during stay. ?

## 2021-08-12 NOTE — ED Notes (Signed)
ED TO INPATIENT HANDOFF REPORT ? ?ED Nurse Name and Phone #: Shirlee Limerick [phone] ? ?S ?Name/Age/Gender ?[name] ?81 y.o. ?female ?Room/Bed: 016C/016C ? ?Code Status ?  Code Status: Full Code ? ?Home/SNF/Other ?Nursing Home ?Patient oriented to: self, place, time, and situation ?Is this baseline? Yes  ? ?Triage Complete: Triage complete  ?Chief Complaint ?Lung cancer ([place]) [C34.90] ? ?Triage Note ?Pt BIB [facility] from [place], c/o intermittent chest pain that started yesterday and became constant this morning. C/o increased shortness of breath when walking, pain radiates to her neck and back. Given 324mg  asa by EMS. 2LNC all the time.  ? ?Allergies ?Allergies  ?Allergen Reactions  ? Keflex [Cephalexin] Shortness Of Breath and Other (See Comments)  ?  Chest pain, headache  ? Lipitor [Atorvastatin] Shortness Of Breath and Other (See Comments)  ?  Severe pain/cramping in legs- "I cannot walk, talk, or breathe" (pt is currently taking 20 mg daily 10/11/19)  ? Codeine Other (See Comments)  ?  Disrupted patient's equilibrium- "Made my world flip upside down"  ? Hydrochlorothiazide Other (See Comments)  ?  Other reaction(s): lightheaded, nausea  ? Hydrocodone-Acetaminophen Other (See Comments)  ?  Other reaction(s): "get crazy"  ? Metformin Hcl Other (See Comments)  ?  Other reaction(s): upset stomach  ? Tramadol Hcl Other (See Comments)  ?  Other reaction(s): "get crazy"  ? Azor [Amlodipine-Olmesartan] Swelling, Palpitations and Rash  ?  Amlodipine alone resumed 12/2013, tolerating  ? Lisinopril Cough  ? Penicillins Dermatitis and Other (See Comments)  ?  Did it involve swelling of the face/tongue/throat, SOB, or low BP? No, just worsened eczema ?Did it involve sudden or severe rash/hives, skin peeling, or any reaction on the inside of your mouth or nose? No ?Did you need to seek medical attention at a hospital or doctor's office? No ?When did it last happen? "More than 10 years ago" ?If all above answers are ?NO?, may  proceed with cephalosporin use. ?  ? ? ?Level of Care/Admitting Diagnosis ?ED Disposition   ? ? ED Disposition  ?Admit  ? Condition  ?--  ? Comment  ?Hospital Area: [facility] [[id number]] ? Level of Care: Telemetry Medical [[id number]] ? May place patient in observation at [facility] or [place] if equivalent level of care is available:: No ? Covid Evaluation: Asymptomatic - no recent exposure (last 10 days) testing not required ? Diagnosis: Lung cancer [facility]) [[id number]] ? Admitting Physician: Etta Quill [[id number]] ? Attending Physician: Etta Quill [phone] ?  ?  ? ?  ? ? ?B ?Medical/Surgery History ?Past Medical History:  ?Diagnosis Date  ? AAA (abdominal aortic aneurysm)   ? Arthritis   ? osteoarthritis  ? Constipation   ? 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 GERD (gastroesophageal reflux disease)   ? Glaucoma   ? Hyperlipidemia   ? Hypertension   ? Kidney stones   ? Myocardial infarction [facility])   ? Peripheral vascular disease ([place])   ? Pneumonia   ? Stroke ([place]) 01/28/2015  ? with left side weakness   ? TIA (transient ischemic attack)   ? Urinary incontinence   ? ?Past Surgical History:  ?Procedure Laterality Date  ? ABDOMINAL AORTAGRAM N/A 03/13/2014  ? Procedure: ABDOMINAL AORTAGRAM;  Surgeon: Elam Dutch, MD;  Location: [facility] CATH LAB;  Service: Cardiovascular;  Laterality: N/A;  ? ABDOMINAL AORTIC ANEURYSM REPAIR  ?2012  ? ABDOMINAL HYSTERECTOMY    ? partial  ?  BACK SURGERY    ? CARDIAC CATHETERIZATION    ? CORONARY ANGIOPLASTY  03/30/2015  ? CORONARY ARTERY BYPASS GRAFT N/A 01/17/2021  ? Procedure: CORONARY ARTERY BYPASS GRAFTING (CABG) TIMES 3 , ON PUMP, USING LEFT INTERNAL MAMMARY ARTERY AND ENDOSCOPICALLY HARVESTED RIGHT GREATER SAPHENOUS VEIN;  Surgeon: Lajuana Matte, MD;  Location: [place];  Service: Open Heart Surgery;  Laterality: N/A;  ? ENDOVEIN HARVEST OF GREATER SAPHENOUS VEIN Right 01/17/2021  ? Procedure: ENDOVEIN HARVEST OF GREATER SAPHENOUS VEIN;   Surgeon: Lajuana Matte, MD;  Location: [place];  Service: Open Heart Surgery;  Laterality: Right;  ? EYE SURGERY Right   ? laser surgery for blood behind eye, loss of sight  ? FEMORAL-POPLITEAL BYPASS GRAFT Left 07/06/2014  ? Procedure: BYPASS GRAFT FEMORAL-POPLITEAL ARTERY;  Surgeon: Elam Dutch, MD;  Location: [place];  Service: Vascular;  Laterality: Left;  ? HERNIA REPAIR    ? JOINT REPLACEMENT    ? LEFT HEART CATH AND CORONARY ANGIOGRAPHY N/A 01/11/2021  ? Procedure: LEFT HEART CATH AND CORONARY ANGIOGRAPHY;  Surgeon: [name], MD;  Location: [place] CV LAB;  Service: Cardiovascular;  Laterality: N/A;  ? LEFT HEART CATHETERIZATION WITH CORONARY ANGIOGRAM N/A 04/12/2014  ? Procedure: LEFT HEART CATHETERIZATION WITH CORONARY ANGIOGRAM;  Surgeon: Clent Demark, MD;  Location: [facility] CATH LAB;  Service: Cardiovascular;  Laterality: N/A;  ? PERCUTANEOUS CORONARY STENT INTERVENTION (PCI-S)  04/12/2014  ? Procedure: PERCUTANEOUS CORONARY STENT INTERVENTION (PCI-S);  Surgeon: Clent Demark, MD;  Location: [facility] CATH LAB;  Service: Cardiovascular;;  prox and mid RCA  ? PERIPHERAL VASCULAR INTERVENTION  06/25/2019  ? Procedure: PERIPHERAL VASCULAR INTERVENTION;  Surgeon: Elam Dutch, MD;  Location: [place] CV LAB;  Service: Cardiovascular;;  Lt Renal and Rt Renal  ? RENAL ANGIOGRAPHY Bilateral 06/25/2019  ? Procedure: RENAL ANGIOGRAPHY;  Surgeon: Elam Dutch, MD;  Location: [place] CV LAB;  Service: Cardiovascular;  Laterality: Bilateral;  ? TEE WITHOUT CARDIOVERSION N/A 01/17/2021  ? Procedure: TRANSESOPHAGEAL ECHOCARDIOGRAM (TEE);  Surgeon: Lajuana Matte, MD;  Location: [place];  Service: Open Heart Surgery;  Laterality: N/A;  ? TOTAL HIP ARTHROPLASTY Left 05/18/2015  ? Procedure: LEFT TOTAL HIP ARTHROPLASTY ANTERIOR APPROACH;  Surgeon: Mcarthur Rossetti, MD;  Location: [place];  Service: Orthopedics;  Laterality: Left;  ? TOTAL KNEE ARTHROPLASTY Left ~ 2003  ? TUBAL LIGATION     ? uterine tumor    ? VENTRAL HERNIA REPAIR    ?  ? ?A ?IV Location/Drains/Wounds ?Patient Lines/Drains/Airways Status   ? ? Active Line/Drains/Airways   ? ? Name Placement date Placement time Site Days  ? Peripheral IV 03/21/21 22 G 1.25" Right;Upper Arm 03/21/21  0513  Arm  144  ? Peripheral IV 08/12/21 22 G Left;Posterior Hand 08/12/21  1505  Hand  less than 1  ? Incision (Closed) 01/17/21 Leg Right 01/17/21  1712  -- 207  ? Incision (Closed) 01/17/21 Chest Other (Comment) 01/17/21  1849  -- 207  ? ?  ?  ? ?  ? ? ?Intake/Output Last 24 hours ?No intake or output data in the 24 hours ending 08/12/21 1956 ? ?Labs/Imaging ?Results for orders placed or performed during the hospital encounter of 08/12/21 (from the past 48 hour(s))  ?Resp Panel by RT-PCR (Flu A&B, Covid) Nasopharyngeal Swab     Status: None  ? Collection Time: 08/12/21  3:47 PM  ? Specimen: Nasopharyngeal Swab; Nasopharyngeal(NP) swabs in vial transport medium  ?Result Value Ref  Range  ? SARS Coronavirus 2 by RT PCR NEGATIVE NEGATIVE  ?  Comment: (NOTE) ?SARS-CoV-2 target nucleic acids are NOT DETECTED. ? ?The SARS-CoV-2 RNA is generally detectable in upper respiratory ?specimens during the acute phase of infection. The lowest ?concentration of SARS-CoV-2 viral copies this assay can detect is ?138 copies/mL. A negative result does not preclude SARS-Cov-2 ?infection and should not be used as the sole basis for treatment or ?other patient management decisions. A negative result may occur with  ?improper specimen collection/handling, submission of specimen other ?than nasopharyngeal swab, presence of viral mutation(s) within the ?areas targeted by this assay, and inadequate number of viral ?copies(<138 copies/mL). A negative result must be combined with ?clinical observations, patient history, and epidemiological ?information. The expected result is Negative. ? ?Fact Sheet for Patients:  ?EntrepreneurPulse.com.au ? ?Fact Sheet for  Healthcare Providers:  ?IncredibleEmployment.be ? ?This test is no t yet approved or cleared by the [place] FDA and  ?has been authorized for detection and/or diagnosis of SARS-CoV-2 by

## 2021-08-12 NOTE — ED Triage Notes (Addendum)
Pt BIB [facility] from [place], c/o intermittent chest pain that started yesterday and became constant this morning. C/o increased shortness of breath when walking, pain radiates to her neck and back. Given 324mg  asa by EMS. 2LNC all the time. ?

## 2021-08-13 ENCOUNTER — Observation Stay (HOSPITAL_COMMUNITY): Payer: Medicare Other

## 2021-08-13 ENCOUNTER — Encounter (HOSPITAL_COMMUNITY): Payer: Self-pay | Admitting: Internal Medicine

## 2021-08-13 DIAGNOSIS — J90 Pleural effusion, not elsewhere classified: Secondary | ICD-10-CM | POA: Diagnosis not present

## 2021-08-13 DIAGNOSIS — I25119 Atherosclerotic heart disease of native coronary artery with unspecified angina pectoris: Secondary | ICD-10-CM | POA: Diagnosis not present

## 2021-08-13 DIAGNOSIS — I700 Atherosclerosis of aorta: Secondary | ICD-10-CM | POA: Diagnosis not present

## 2021-08-13 DIAGNOSIS — J439 Emphysema, unspecified: Secondary | ICD-10-CM | POA: Diagnosis not present

## 2021-08-13 DIAGNOSIS — J918 Pleural effusion in other conditions classified elsewhere: Secondary | ICD-10-CM | POA: Diagnosis not present

## 2021-08-13 DIAGNOSIS — R079 Chest pain, unspecified: Secondary | ICD-10-CM | POA: Diagnosis not present

## 2021-08-13 DIAGNOSIS — C3481 Malignant neoplasm of overlapping sites of right bronchus and lung: Secondary | ICD-10-CM | POA: Diagnosis not present

## 2021-08-13 DIAGNOSIS — N183 Chronic kidney disease, stage 3 unspecified: Secondary | ICD-10-CM | POA: Diagnosis not present

## 2021-08-13 DIAGNOSIS — E1159 Type 2 diabetes mellitus with other circulatory complications: Secondary | ICD-10-CM | POA: Diagnosis not present

## 2021-08-13 DIAGNOSIS — I10 Essential (primary) hypertension: Secondary | ICD-10-CM | POA: Diagnosis not present

## 2021-08-13 DIAGNOSIS — R918 Other nonspecific abnormal finding of lung field: Secondary | ICD-10-CM | POA: Diagnosis not present

## 2021-08-13 LAB — COMPREHENSIVE METABOLIC PANEL
ALT: 9 U/L (ref 0–44)
AST: 16 U/L (ref 15–41)
Albumin: 3.1 g/dL — ABNORMAL LOW (ref 3.5–5.0)
Alkaline Phosphatase: 46 U/L (ref 38–126)
Anion gap: 10 (ref 5–15)
BUN: 35 mg/dL — ABNORMAL HIGH (ref 8–23)
CO2: 19 mmol/L — ABNORMAL LOW (ref 22–32)
Calcium: 9.4 mg/dL (ref 8.9–10.3)
Chloride: 110 mmol/L (ref 98–111)
Creatinine, Ser: 2.86 mg/dL — ABNORMAL HIGH (ref 0.44–1.00)
GFR, Estimated: 16 mL/min — ABNORMAL LOW (ref >60)
Glucose, Bld: 132 mg/dL — ABNORMAL HIGH (ref 70–99)
Potassium: 5.3 mmol/L — ABNORMAL HIGH (ref 3.5–5.1)
Sodium: 139 mmol/L (ref 135–145)
Total Bilirubin: 0.4 mg/dL (ref 0.3–1.2)
Total Protein: 7.1 g/dL (ref 6.5–8.1)

## 2021-08-13 LAB — LACTATE DEHYDROGENASE, PLEURAL OR PERITONEAL FLUID: LD, Fluid: 403 U/L — ABNORMAL HIGH (ref 3–23)

## 2021-08-13 LAB — BODY FLUID CELL COUNT WITH DIFFERENTIAL
Eos, Fluid: 0 %
Lymphs, Fluid: 24 %
Monocyte-Macrophage-Serous Fluid: 60 % (ref 50–90)
Neutrophil Count, Fluid: 16 % (ref 0–25)
Total Nucleated Cell Count, Fluid: 2778 cu mm — ABNORMAL HIGH (ref 0–1000)

## 2021-08-13 LAB — CBC
HCT: 26.2 % — ABNORMAL LOW (ref 36.0–46.0)
Hemoglobin: 8.5 g/dL — ABNORMAL LOW (ref 12.0–15.0)
MCH: 29 pg (ref 26.0–34.0)
MCHC: 32.4 g/dL (ref 30.0–36.0)
MCV: 89.4 fL (ref 80.0–100.0)
Platelets: 181 10*3/uL (ref 150–400)
RBC: 2.93 MIL/uL — ABNORMAL LOW (ref 3.87–5.11)
RDW: 14.7 % (ref 11.5–15.5)
WBC: 7.5 10*3/uL (ref 4.0–10.5)
nRBC: 0 % (ref 0.0–0.2)

## 2021-08-13 LAB — PROTEIN, TOTAL: Total Protein: 7 g/dL (ref 6.5–8.1)

## 2021-08-13 LAB — SURGICAL PCR SCREEN
MRSA, PCR: NEGATIVE
Staphylococcus aureus: NEGATIVE

## 2021-08-13 LAB — PROTEIN, PLEURAL OR PERITONEAL FLUID
Total protein, fluid: 4.7 g/dL
Total protein, fluid: 4.8 g/dL

## 2021-08-13 LAB — GLUCOSE, PLEURAL OR PERITONEAL FLUID: Glucose, Fluid: 86 mg/dL

## 2021-08-13 LAB — LACTATE DEHYDROGENASE: LDH: 338 U/L — ABNORMAL HIGH (ref 98–192)

## 2021-08-13 LAB — GLUCOSE, CAPILLARY
Glucose-Capillary: 127 mg/dL — ABNORMAL HIGH (ref 70–99)
Glucose-Capillary: 111 mg/dL — ABNORMAL HIGH (ref 70–99)
Glucose-Capillary: 116 mg/dL — ABNORMAL HIGH (ref 70–99)
Glucose-Capillary: 104 mg/dL — ABNORMAL HIGH (ref 70–99)
Glucose-Capillary: 125 mg/dL — ABNORMAL HIGH (ref 70–99)

## 2021-08-13 MED ORDER — LIDOCAINE HCL (PF) 1 % IJ SOLN
INTRAMUSCULAR | Status: AC
Start: 2021-08-13 — End: 2021-08-13
  Filled 2021-08-13: qty 30

## 2021-08-13 MED ORDER — MUPIROCIN 2 % EX OINT
1.0000 "application " | TOPICAL_OINTMENT | Freq: Two times a day (BID) | CUTANEOUS | Status: DC
  Administered 2021-08-14: 1 via NASAL
  Filled 2021-08-13: qty 22

## 2021-08-13 MED ORDER — SODIUM ZIRCONIUM CYCLOSILICATE 10 G PO PACK
10.0000 g | PACK | Freq: Once | ORAL | Status: AC
  Administered 2021-08-13: 10 g via ORAL
  Filled 2021-08-13: qty 1

## 2021-08-13 NOTE — Procedures (Signed)
PROCEDURE SUMMARY: ? ?Successful US guided right thoracentesis. ?Yielded 1.3 L of bloody pleural fluid. ?Pt tolerated procedure well. ?No immediate complications. ? ?Specimen was sent for labs. ?CXR ordered. ? ?EBL < 5 mL ? ?Ascencion Dike PA-C ?08/13/2021 ?9:10 AM ? ? ? ?

## 2021-08-13 NOTE — Consult Note (Signed)
? ?NAME:  [name], MRN:  [id number], DOB:  1941-02-14, LOS: 0 ?ADMISSION DATE:  08/12/2021, CONSULTATION DATE:  08/13/21 ?REFERRING MD:  Dr. Doristine Bosworth, CHIEF COMPLAINT:  SOB/ CP  ? 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   ? ?Patient is a poor historian.  Reports exertional shortness of breath for at least 3 weeks, possibly longer, but was placed possibly on oxygen last week for SOB.  She reports using a motorized wheelchair since December.   Reports weight loss but unsure of amount, possibly one dress size.  On chart review> documented 25 lbs weight loss since September 2022.  She has smoked cigarettes since around 81 yo, 1 ppd at her heaviest, and has decided to quit smoking as of this admission.  Does report a new cough with sputum production but is not able to describe, but denies any hemoptysis.  Reports being cold all the time, but otherwise, no fever, N/V.  Denies current or previous chest pain.  ? ?On workup, she has been afebrile and hemodynamically stable.  Found to have large right pleural effusion on CXR.  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   Of note, had normal CXR in October 2022 and no findings concerning for cancer on chest CT in April 2022.  Pulmonary consulted for further recommendations.  Patient scheduled for diagnostic and therapuetic right thoracentesis this morning in IR.   ? 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 ?Significant Hospital Events: ?Including procedures, antibiotic start and stop dates in addition to other pertinent events   ?3/17 admitted to [facility] with SOB, CP, large right effusion, CT chest ?3/18 R thora by IR > 1.3L bloody output, repeat chest CT ? ?Interim History / Subjective:  ?S/p IR with 1.3L of bloody pleural fluid removed ?Just returned from post thora chest CT ? ?Objective   ?Blood pressure 128/60, pulse 69, temperature 98.2 ?F (36.8 ?C), temperature source Oral, resp. rate 16, height 5\' 6"  (1.676 m), weight 65.6 kg, SpO2 100 %. ?   ?   ? ?Intake/Output Summary (Last 24 hours) at 08/13/2021 1043 ?Last data filed at 08/12/2021 2345 ?Gross per 24 hour  ?Intake 240 ml  ?Output --  ?Net 240 ml  ? ?Filed Weights  ? 08/12/21 2305  ?Weight: 65.6 kg  ? ?Examination: ?General:  Frail and thin elderly female lying in bed in NAD ?HEENT: MM pink/moist ?Neuro:  Awake, appropriate, MAE ?CV: rr ?PULM:  non labored but slightly tachypneic, minimal air movement in RUL with faint exp wheeze, no lower breath sounds, left clear no wheeze, remains on 2L [place] >96% ?GI: soft, bs+, NT  ?Extremities: warm/dry, no LE edema, cachetic  ?Skin: no rashes ? ? ?Resolved Hospital Problem list   ? ?Assessment & Plan:  ? 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  Given symptoms of unexplained  weight loss, smoking history, progressive SOB, and rapid imaging findings from September 2022, highly concerning for cancer ?P:  ?- s/p right thora w/ IR with 1.3L bloody output> awaiting pleural studies, cell count, culture, GS, and cytology.  May not yield positive cytology results for up to three samplings.  Would hold off for now on considering inpatient FOB.  Would favor more outpatient PET, workup (needs head and abdominal imaging), and bronchoscopy ?- pending  official read of post R thora chest CT for better visualization of lung> very slight improvement in aeration of RUL.  Would benefit from additional thoracentesis.  She is on ASA/ plavix.  Will hold plavix in anticipation of repeat thora on 3/19.  Pulmonary will arrange.  ?- patient already states she has quit smoking as of this admission ?- attempts to wean oxygen for sats 92-99 ? ?Pulmonary will follow along.  ? ?Best Practice (right click and "Reselect all SmartList Selections" daily)  ?Per primary team.  ? ?Labs   ?CBC: ?Recent Labs  ?Lab 08/12/21 ?1549 08/13/21 ?0210  ?WBC 8.7 7.5  ?NEUTROABS 6.4  --   ?HGB 9.3* 8.5*  ?HCT 28.8* 26.2*  ?MCV 90.3 89.4  ?PLT 189 181  ? ? ?Basic Metabolic Panel: ?Recent Labs  ?Lab 08/12/21 ?1549 08/13/21 ?0210  ?NA 136 139  ?K 5.2* 5.3*  ?CL 108 110  ?CO2 18* 19*  ?GLUCOSE 132* 132*  ?BUN 34* 35*  ?CREATININE 2.94* 2.86*  ?CALCIUM 9.1 9.4  ? ?GFR: ?Estimated Creatinine Clearance: 14.2 mL/min (A) (by C-G formula based on SCr of 2.86 mg/dL (H)). ?Recent Labs  ?Lab 08/12/21 ?1549 08/13/21 ?0210  ?WBC 8.7 7.5  ? ? ?Liver Function Tests: ?Recent Labs  ?Lab 08/12/21 ?1549 08/13/21 ?0210  ?AST 20 16  ?ALT 9 9  ?ALKPHOS 47 46  ?BILITOT 0.4 0.4  ?PROT 7.6 7.0  7.1  ?ALBUMIN 3.4* 3.1*  ? ?Recent Labs  ?Lab 08/12/21 ?1549  ?LIPASE 29  ? ?No results for input(s): AMMONIA in the last 168 hours. ? ?ABG ?   ?Component Value Date/Time  ? PHART 7.380 01/19/2021 0131  ? PCO2ART 40.3 01/19/2021 0131  ? PO2ART 117 (H) 01/19/2021 0131  ? HCO3 23.6 01/19/2021 0131  ? TCO2 25 01/19/2021 0131  ? ACIDBASEDEF 1.0 01/19/2021 0131  ? O2SAT 98.0 01/19/2021 0131  ?  ? ?Coagulation Profile: ?No results for input(s): INR, PROTIME in the last 168 hours. ? ?Cardiac Enzymes: ?No results for input(s): CKTOTAL, CKMB, CKMBINDEX, TROPONINI in the last 168 hours. ? ?HbA1C: ?Hgb A1c MFr Bld  ?Date/Time Value Ref Range Status  ?08/12/2021 03:49 PM 5.8 (H) 4.8 - 5.6 % Final  ?  Comment:  ?  (NOTE) ?Pre diabetes:           5.7%-6.4% ? ?Diabetes:              >6.4% ? ?Glycemic control for   <7.0% ?adults with diabetes ?  ?01/17/2021 03:18 AM 9.1 (H) 4.8 - 5.6 % Final  ?  Comment:  ?  REPEATED TO VERIFY ?(NOTE) ?Pre diabetes:          5.7%-6.4% ? ?Diabetes:              >6.4% ? ?Glycemic control for   <7.0% ?adults with diabetes ?  ? ? ?CBG: ?Recent Labs  ?Lab 08/12/21 ?2028 08/12/21 ?2341 08/13/21 ?3825  ?GLUCAP 105* 97 127*  ? ? ?Review of Systems:   ?Limited> positive for SOB, cough, fatigue, weight loss.  Otherwise all  systems negative. ? ?Past Medical History:  ?She,  has a past medical history of AAA (abdominal aortic aneurysm), Arthritis, Constipation, DM with vascular complications, GERD (gastroesophageal reflux disease), Glaucoma, Hyperlipidemia, Hypertension, Kidney stones, Myocardial infarction [facility]), Peripheral vascular disease ([place]), Pneumonia, Stroke ([place]) (01/28/2015), TIA (transient ischemic attack), and Urinary incontinence.  ? ?Surgical History:  ? ?Past Surgical History:  ?Procedure Laterality Date  ? ABDOMINAL AORTAGRAM N/A 03/13/2014  ? Procedure: ABDOMINAL AORTAGRAM;  Surgeon: Elam Dutch, MD;  [facility] CATH LAB;  Service: Cardiovascular;  Laterality: N/A;  ? ABDOMINAL AORTIC ANEURYSM REPAIR  ?2012  ? ABDOMINAL HYSTERECTOMY    ? partial  ? BACK SURGERY    ? CARDIAC CATHETERIZATION    ? CORONARY ANGIOPLASTY  03/30/2015  ? CORONARY ARTERY BYPASS GRAFT N/A 01/17/2021  ? Procedure: CORONARY ARTERY BYPASS GRAFTING (CABG) TIMES 3 , ON PUMP, USING LEFT INTERNAL MAMMARY ARTERY AND ENDOSCOPICALLY HARVESTED RIGHT GREATER SAPHENOUS VEIN;  Surgeon: Lajuana Matte, MD;  Location: [place];  Service: Open Heart Surgery;  Laterality: N/A;  ? ENDOVEIN HARVEST OF GREATER SAPHENOUS VEIN Right 01/17/2021  ? Procedure: ENDOVEIN HARVEST OF GREATER SAPHENOUS VEIN;  Surgeon: Lajuana Matte, MD;  Location: [place];  Service: Open Heart Surgery;  Laterality: Right;  ? EYE SURGERY Right   ? laser surgery for blood behind  eye, loss of sight  ? FEMORAL-POPLITEAL BYPASS GRAFT Left 07/06/2014  ? Procedure: BYPASS GRAFT FEMORAL-POPLITEAL ARTERY;  Surgeon: Elam Dutch, MD;  Location: [place];  Service: Vascular;  Laterality: [place] Comment

## 2021-08-13 NOTE — Progress Notes (Addendum)
?PROGRESS NOTE ? ? ? ?[name]  ION:[id number] DOB: [birth year] DOA: 08/12/2021 ?PCP: [name], MD ? ? ?Brief Narrative:  ?[name] is a 81 y.o. female with medical history significant of CAD s/p CABG, DM2, HTN, stroke, smoking.Pt presents to ED with c/o CP onset yesterday, became constant this AM Having increasing SOB for past 1 week.Having wt loss for past couple of months per daughter. Recently started on 2L O2 at home due to SOB.  ? ?Upon arrival to ED: Patient's vital signs stable.  Requiring 2 L of oxygen via nasal cannula.  Afebrile.  COVID negative.  Potassium 5.2.  Creatinine: 3.71, GFR: 12.  CBC shows no leukocytosis, H&H 9.3/28.8.  Lipase: WNL, troponin: WNL.  A1c 5.8% CT chest concerning for bronchogenic carcinoma with malignant pleural effusion.  IR consulted for ultrasound-guided thoracentesis.  [facility] hospitalist consulted for admission for further evaluation and management. ? ?Assessment & Plan: ?  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 ?-She is afebrile with no leukocytosis.  COVID-19 negative.   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  Recommend outpatient PET CT scan f and MRI brain for further staging purposes ?-Plavix stopped after discussion with cardiology by PCCM ? ?Right-sided pleural effusion: ?-S/p thoracentesis on 3/18 as above ?-Suspect due to underlying malignancy ?-CT chest after thoracentesis shows decreased but persistent moderate to large right pleural effusion. ? ?Chronic kidney disease stage IV: ?-Looks like  kidney function slightly improved today. ?-Discussed with on-call nephrology Dr. Peeples-recommended to continue to monitor renal function and avoid nephrotoxic medications.  ?-Monitor BMP closely ? ?Anemia of chronic disease: ?-In the setting of chronic kidney disease ?-Baseline hemoglobin 10.  Hemoglobin dropped from 9.3-8.5 this morning. ?-Monitor H&H closely and transfuse as needed. ? ?Hyperkalemia: Potassium 5.3 ?-Lokelma given ?-Repeat BMP tomorrow a.m. ? ?Type 2 diabetes mellitus: Well controlled.  A1c 5.8% ?-Continue sensitive sliding scale insulin ? ?Hypertension: ?-Continue metoprolol, Imdur and amlodipine. ? ?Hyperlipidemia: Continue with statin ? ?Tobacco abuse ?Emphysema: ?-Continue albuterol as needed.  Nicotine patch ordered ? ?Aortic atherosclerosis noted on CT chest. ? ?Patient is at increased risk of mortality and morbidity. ?Consulted palliative care for further discussion of goals of care with patient and her family ? ?DVT prophylaxis: SCD ?Code Status: Full code-confirmed with patient ?Family Communication:  None present at bedside.  Plan of care discussed with patient in length and she verbalized understanding and agreed with it. ? ?I called patient's daughter [name] however she did not answer my call. ? ?Disposition Plan: To be determined ?Consultants:  ?PCCM ?Nephrology ? ?Procedures:  ?Total sentences ? ?Antimicrobials:  ?None ? ?Status is: Observation ? ? ? ? ?Subjective: ?Patient seen and examined.  Resting comfortably on the bed.  Reports improvement in breathing after thoracentesis.  No chest pain, fever or chills. ? ?Objective: ?Vitals:  ? 08/13/21 0841 08/13/21 0908 08/13/21 1000 08/13/21 1311  ?BP: (!) 128/51 (!) 132/51 128/60 (!) 118/52  ?Pulse:   69 70  ?Resp:   16 18  ?Temp:    98.2 ?F (36.8 ?C)  ?TempSrc:    Oral  ?SpO2:   100% 100%  ?Weight:      ?Height:      ? ? ?  Intake/Output Summary (Last 24 hours) at 08/13/2021 1430 ?Last data filed at 08/13/2021 1312 ?Gross per 24 hour   ?Intake 240 ml  ?Output 400 ml  ?Net -160 ml  ? ?Filed Weights  ? 08/12/21 2305  ?Weight: 65.6 kg  ? ? ?Examination: ? ?General exam: Appears calm and comfortable, on nasal cannula, communicating well ?Respiratory system: decreased breath sounds on right side.  No wheezing, rhonchi.  Cardiovascular system: S1 & S2 heard, RRR. No JVD, murmurs, rubs, gallops or clicks. No pedal edema. ?Gastrointestinal system: Abdomen is nondistended, soft and nontender. No organomegaly or masses felt. Normal bowel sounds heard. ?Central nervous system: Alert and oriented. No focal neurological deficits. ?Extremities: Symmetric 5 x 5 power. ?Skin: No rashes, lesions or ulcers ?Psychiatry: Judgement and insight appear normal. Mood & affect appropriate.  ? ? ?Data Reviewed: I have personally reviewed following labs and imaging studies ? ?CBC: ?Recent Labs  ?Lab 08/12/21 ?1549 08/13/21 ?0210  ?WBC 8.7 7.5  ?NEUTROABS 6.4  --   ?HGB 9.3* 8.5*  ?HCT 28.8* 26.2*  ?MCV 90.3 89.4  ?PLT 189 181  ? ?Basic Metabolic Panel: ?Recent Labs  ?Lab 08/12/21 ?1549 08/13/21 ?0210  ?NA 136 139  ?K 5.2* 5.3*  ?CL 108 110  ?CO2 18* 19*  ?GLUCOSE 132* 132*  ?BUN 34* 35*  ?CREATININE 2.94* 2.86*  ?CALCIUM 9.1 9.4  ? ?GFR: ?Estimated Creatinine Clearance: 14.2 mL/min (A) (by C-G formula based on SCr of 2.86 mg/dL (H)). ?Liver Function Tests: ?Recent Labs  ?Lab 08/12/21 ?1549 08/13/21 ?0210  ?AST 20 16  ?ALT 9 9  ?ALKPHOS 47 46  ?BILITOT 0.4 0.4  ?PROT 7.6 7.0  7.1  ?ALBUMIN 3.4* 3.1*  ? ?Recent Labs  ?Lab 08/12/21 ?1549  ?LIPASE 29  ? ?No results for input(s): AMMONIA in the last 168 hours. ?Coagulation Profile: ?No results for input(s): INR, PROTIME in the last 168 hours. ?Cardiac Enzymes: ?No results for input(s): CKTOTAL, CKMB, CKMBINDEX, TROPONINI in the last 168 hours. ?BNP (last 3 results) ?No results for input(s): PROBNP in the last 8760 hours. ?HbA1C: ?Recent Labs  ?  08/12/21 ?1549  ?HGBA1C 5.8*  ? ?CBG: ?Recent Labs  ?Lab 08/12/21 ?2028  08/12/21 ?2341 08/13/21 ?3086 08/13/21 ?1117  ?GLUCAP 105* 97 127* 111*  ? ?Lipid Profile: ?No results for input(s): CHOL, HDL, LDLCALC, TRIG, CHOLHDL, LDLDIRECT in the last 72 hours. ?Thyroid Function Tests: ?No results for input(s): TSH, T4TOTAL, FREET4, T3FREE, THYROIDAB in the last 72 hours. ?Anemia Panel: ?No results for input(s): VITAMINB12, FOLATE, FERRITIN, TIBC, IRON, RETICCTPCT in the last 72 hours. ?Sepsis Labs: ?No results for input(s): PROCALCITON, LATICACIDVEN in the last 168 hours. ? ?Recent Results (from the past 240 hour(s))  ?Resp Panel by RT-PCR (Flu A&B, Covid) Nasopharyngeal Swab     Status: None  ? Collection Time: 08/12/21  3:47 PM  ? Specimen: Nasopharyngeal Swab; Nasopharyngeal(NP) swabs in vial transport medium  ?Result Value Ref Range Status  ? SARS Coronavirus 2 by RT PCR NEGATIVE NEGATIVE Final  ?  Comment: (NOTE) ?SARS-CoV-2 target nucleic acids are NOT DETECTED. ? ?The SARS-CoV-2 RNA is generally detectable in upper respiratory ?specimens during the acute phase of infection. The lowest ?concentration of SARS-CoV-2 viral copies this assay can detect is ?138 copies/mL. A negative result does not preclude SARS-Cov-2 ?infection and should not be used as the sole basis for treatment or ?other patient management decisions. A negative result may occur with  ?improper specimen collection/handling, submission of specimen other ?than nasopharyngeal swab, presence of viral  mutation(s) within the ?areas targeted by this assay, and inadequate number of viral ?copies(<138 copies/mL). A negative result must be combined with ?clinical observations, patient history, and epidemiological ?information. The expected result is Negative. ? ?Fact Sheet for Patients:  ?EntrepreneurPulse.com.au ? ?Fact Sheet for Healthcare Providers:  ?IncredibleEmployment.be ? ?This test is no t yet approved or cleared by the [place] FDA and  ?has been authorized for detection and/or  diagnosis of SARS-CoV-2 by ?FDA under an Emergency Use Authorization (EUA). This EUA will remain  ?in effect (meaning this test can be used) for the duration of the ?COVID-19 declaration under Section 564(b)(1) of the Act,

## 2021-08-14 DIAGNOSIS — Z66 Do not resuscitate: Secondary | ICD-10-CM

## 2021-08-14 DIAGNOSIS — C3481 Malignant neoplasm of overlapping sites of right bronchus and lung: Secondary | ICD-10-CM | POA: Diagnosis not present

## 2021-08-14 DIAGNOSIS — E1159 Type 2 diabetes mellitus with other circulatory complications: Secondary | ICD-10-CM | POA: Diagnosis not present

## 2021-08-14 DIAGNOSIS — R0789 Other chest pain: Secondary | ICD-10-CM | POA: Diagnosis not present

## 2021-08-14 DIAGNOSIS — R079 Chest pain, unspecified: Secondary | ICD-10-CM | POA: Diagnosis not present

## 2021-08-14 DIAGNOSIS — Z7189 Other specified counseling: Secondary | ICD-10-CM

## 2021-08-14 DIAGNOSIS — Z515 Encounter for palliative care: Secondary | ICD-10-CM

## 2021-08-14 DIAGNOSIS — N183 Chronic kidney disease, stage 3 unspecified: Secondary | ICD-10-CM | POA: Diagnosis not present

## 2021-08-14 DIAGNOSIS — I959 Hypotension, unspecified: Secondary | ICD-10-CM | POA: Diagnosis not present

## 2021-08-14 DIAGNOSIS — I10 Essential (primary) hypertension: Secondary | ICD-10-CM | POA: Diagnosis not present

## 2021-08-14 DIAGNOSIS — Z7401 Bed confinement status: Secondary | ICD-10-CM | POA: Diagnosis not present

## 2021-08-14 DIAGNOSIS — I25119 Atherosclerotic heart disease of native coronary artery with unspecified angina pectoris: Secondary | ICD-10-CM | POA: Diagnosis not present

## 2021-08-14 LAB — BASIC METABOLIC PANEL
Anion gap: 9 (ref 5–15)
BUN: 32 mg/dL — ABNORMAL HIGH (ref 8–23)
CO2: 17 mmol/L — ABNORMAL LOW (ref 22–32)
Calcium: 8.8 mg/dL — ABNORMAL LOW (ref 8.9–10.3)
Chloride: 112 mmol/L — ABNORMAL HIGH (ref 98–111)
Creatinine, Ser: 2.57 mg/dL — ABNORMAL HIGH (ref 0.44–1.00)
GFR, Estimated: 18 mL/min — ABNORMAL LOW (ref >60)
Glucose, Bld: 84 mg/dL (ref 70–99)
Potassium: 5.1 mmol/L (ref 3.5–5.1)
Sodium: 138 mmol/L (ref 135–145)

## 2021-08-14 LAB — GLUCOSE, CAPILLARY
Glucose-Capillary: 81 mg/dL (ref 70–99)
Glucose-Capillary: 124 mg/dL — ABNORMAL HIGH (ref 70–99)
Glucose-Capillary: 82 mg/dL (ref 70–99)

## 2021-08-14 LAB — CBC
HCT: 25.5 % — ABNORMAL LOW (ref 36.0–46.0)
Hemoglobin: 7.9 g/dL — ABNORMAL LOW (ref 12.0–15.0)
MCH: 28.5 pg (ref 26.0–34.0)
MCHC: 31 g/dL (ref 30.0–36.0)
MCV: 92.1 fL (ref 80.0–100.0)
Platelets: 160 10*3/uL (ref 150–400)
RBC: 2.77 MIL/uL — ABNORMAL LOW (ref 3.87–5.11)
RDW: 14.6 % (ref 11.5–15.5)
WBC: 6.6 10*3/uL (ref 4.0–10.5)
nRBC: 0 % (ref 0.0–0.2)

## 2021-08-14 LAB — MAGNESIUM: Magnesium: 2.7 mg/dL — ABNORMAL HIGH (ref 1.7–2.4)

## 2021-08-14 MED ORDER — OXYCODONE-ACETAMINOPHEN 5-325 MG PO TABS: 1.0000 | ORAL_TABLET | Freq: Four times a day (QID) | ORAL | 0 refills | Status: AC | PRN

## 2021-08-14 NOTE — Progress Notes (Signed)
Patient discharged with PTAR.  PTAR to transport to SNF, [place]. ?Patient left with all personal belongings.  PTAR received discharge packet with DNR, AVS and hard copy of prescription.  Patient's IV removed, taken off telemetry & CCMD notified. ?

## 2021-08-14 NOTE — TOC Progression Note (Signed)
Transition of Care (TOC) - Progression Note  ? ? ?Patient Details  ?Name: [name] ?MRN: [id number] ?Date of Birth: June 28, 1940 ? ?Transition of Care (TOC) CM/SW Contact  ?Bary Castilla, LCSW ?Phone Number:[phone] ?08/14/2021, 3:57 PM ? ?Clinical Narrative:    ? ?CSW spoke with Anderson [place] at  facility about pt's DC and they asked if pt could be DC tomorrow. CSW discussed that pt is a LTC pt and she is medically ready for DC. They agreed and asked for her DC summary to be faxed to [phone]. ? ?CSW has alerted MD and pt's daughter notified of DC today. ? ?TOC team will continue to assist with discharge planning needs.  ? ?Expected Discharge Plan: [place] ?Barriers to Discharge: Continued Medical Work up ? ?Expected Discharge Plan and Services ?Expected Discharge Plan: [place] ?In-house Referral: Clinical Social Work ?  ?  ?Living arrangements for the past 2 months: [place] ?                ?  ?  ?  ?  ?  ?  ?[place] Agency: [facility] and [place] ?Date HH Agency Contacted: 08/14/21 ?Time [place]: 2549 ?Representative spoke with at [place]: Venia Carbon ? ? ?Social Determinants of Health (SDOH) Interventions ?  ? ?Readmission Risk Interventions ?No flowsheet data found. ? ?

## 2021-08-14 NOTE — Progress Notes (Signed)
Manufacturing engineer (ACC) ? ?Referral received for outpatient palliative care services once Ms. Zunker is discharged to [facility]. ? ?ACC will follow up with patient at the facility. ? ?Thank you, ?Venia Carbon BSN, RN ?[facility] Liaison  ?

## 2021-08-14 NOTE — Consult Note (Signed)
? ?                                                                                ?Consultation Note ?Date: 08/14/2021  ? ?Patient Name: [name]  ?DOB: 18-May-1941  MRN: [id number]  Age / Sex: 81 y.o., female  ?PCP: [name], MD ?Referring Physician: Mckinley Jewel, MD ? ?Reason for Consultation: Establishing goals of care ? ?HPI/Patient Profile: 81 y.o. female  with past medical history of stroke, CAD s/p CABG, HTN, HLD, diabetes, CKD stage 3, PVD, smoking admitted on 08/12/2021 from [place] with chest pain and short of breath. 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 S/P 1.3L right thoracentesis 08/13/21. Nursing notes increased confusion at night with history of sundowning with poor functional status (minimally ambulatory at facility and requires assistance with feeding at times). PCCM have recommended further CT scans, MRI brain, and outpatient PET scan for staging purposes along with follow up with oncology. Per PCCM patient/family interested in knowing diagnosis but not interested in pursuing treatment.  ? ?Clinical Assessment and Goals of Care: ?I met today with [name] but no family/visitors at bedside. She is awake, alert and very pleasant. She is able to tell me that she has serious disease in her lungs and is awaiting further testing. She asks me about how bad this is and tells me that she feels like God is getting ready for her. We discussed the extent of disease in the lungs and that this does not look good. She speaks very clearly of her desire not to suffer or go through interventions that may cause her suffering. She does not want to put herself or her family through this. She talks of having a good life and her children being grown and that she has lived her life the best she can. She talks of trusting God to take care of her. We discussed code  status and she tells me that she has told her doctor before that she does not want to be resuscitated. She agrees with DNR status. She is not really interested in chemotherapy or aggressive treatment. Her main concern is making sure that she prepares her daughters "because I know I am going to die." She is not afraid. We discussed hospice and she would be okay with having their assistance at [place] to ensure her comfort. She gives me permission to call and speak with her daughters Otila Kluver and Thayer Headings. ? ?I called and spoke with Tina/[name]. She is aware of situation and anticipation that her mother has an advanced cancer. Otila Kluver shares that [name] has had 2 of her children die of cancer and has had 3 of her 5 children that have died. They have been through a lot together and they know how cancer and treatment progresses and what it entails. Otila Kluver shares that she would not want her mother to go through treatment as she is not very strong - we discussed that [name] agrees this is not what she would want. We discussed plans for further less invasive testing to determine type of cancer and  they want to know what they are dealing with but no plans for treatment. Discussed Ms. Meline's desire for DNR and Otila Kluver shares that this has been in place at facility and is consistent with her known wishes. We discussed hospice support and Otila Kluver would very much appreciate assistance from hospice to care for her mother and ensure her comfort.  ? ?All questions/concerns addressed. Emotional support provided. Updated TOC and Dr. Doristine Bosworth.  ? ?Primary Decision Maker ?PATIENT ?HCPOA 1) [name] 2) [place] ?  ? ?SUMMARY OF RECOMMENDATIONS   ?- DNR confirmed ?- Further diagnostics to determine type and progression of cancer but no desire for treatment ?- They both agree for hospice once testing is completed  ? ?Code Status/Advance Care Planning: ?DNR ? ? ?Symptom Management:  ?Per attending.   ? ?Psycho-social/Spiritual:  ?Desire for further Chaplaincy support:yes ?Additional Recommendations: Education on Hospice and Grief/Bereavement Support ? ?Prognosis:  ?< 6 months ? ?Discharge Planning: Return to facility with plans to engage hospice services once scans/diagnostics completed.  ? ?  ? ?Primary Diagnoses: ?Present on Admission: ? Coronary artery disease involving native coronary artery of native heart with angina pectoris ([place]) ? Type 2 diabetes mellitus with circulatory disorder ([place]) ? Hypertension ? CKD (chronic kidney disease) stage 3, GFR 30-59 ml/min (HCC) ? Lung cancer ([place]) ? ? ?I have reviewed the medical record, interviewed the patient and family, and examined the patient. The following aspects are pertinent. ? ?Past Medical History:  ?Diagnosis Date  ? AAA (abdominal aortic aneurysm)   ? Arthritis   ? osteoarthritis  ? Constipation   ? 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 GERD (gastroesophageal reflux disease)   ? Glaucoma   ? Hyperlipidemia   ? Hypertension   ? Kidney stones   ? Myocardial infarction [facility])   ? Peripheral vascular disease ([place])   ? Pneumonia   ? Stroke ([place]) 01/28/2015  ? with left side weakness   ? TIA (transient ischemic attack)   ? Urinary incontinence   ? ?Social History  ? ?Socioeconomic History  ? Marital status: Widowed  ?  Spouse name: Not on file  ? Number of children: Not on file  ? Years of education: Not on file  ? Highest education level: Not on file  ?Occupational History  ? Not on file  ?Tobacco Use  ? Smoking status: Former  ?  Packs/day: 0.50  ?  Years: 40.00  ?  Pack years: 20.00  ?  Types: Cigarettes  ? Smokeless tobacco: Never  ?Vaping Use  ? Vaping Use: Never used  ?Substance and Sexual Activity  ? Alcohol use: No  ?  Alcohol/week: 0.0 standard drinks  ? Drug use: No  ? Sexual activity: Not on file  ?Other Topics Concern  ? Not on file  ?Social History Narrative  ? Not on file  ? ?Social Determinants of Health  ? ?Financial  Resource Strain: Not on file  ?Food Insecurity: Not on file  ?Transportation Needs: Not on file  ?Physical Activity: Not on file  ?Stress: Not on file  ?Social Connections: Not on file  ? ?Family History  ?Problem Relation Age of Onset  ? Diabetes Mother   ? Stroke Mother   ? Hypertension Father   ? Stroke Father   ? Hyperlipidemia Sister   ? Hypertension Sister   ? Aneurysm Sister   ? Hyperlipidemia Brother   ? Hypertension Brother   ? ?Scheduled Meds: ? amLODipine  10 mg Oral Daily  ? aspirin EC  81 mg Oral Daily  ? famotidine  20 mg Oral Daily  ? insulin aspart  0-9 Units Subcutaneous Q4H  ? isosorbide mononitrate  60 mg Oral Daily  ? metoprolol succinate  25 mg Oral Daily  ? mupirocin ointment  1 application. Nasal BID  ? nicotine  7 mg Transdermal Daily  ? rosuvastatin  10 mg Oral QHS  ? ?Continuous Infusions: ?PRN Meds:.acetaminophen **OR** acetaminophen, albuterol, HYDROmorphone (DILAUDID) injection, ondansetron **OR** ondansetron (ZOFRAN) IV ?Allergies  ?Allergen Reactions  ? Keflex [Cephalexin] Shortness Of Breath and Other (See Comments)  ?  Chest pain, headache  ? Lipitor [Atorvastatin] Shortness Of Breath and Other (See Comments)  ?  Severe pain/cramping in legs- "I cannot walk, talk, or breathe" (pt is currently taking 20 mg daily 10/11/19)  ? Codeine Other (See Comments)  ?  Disrupted patient's equilibrium- "Made my world flip upside down"  ? Hydrochlorothiazide Other (See Comments)  ?  Other reaction(s): lightheaded, nausea  ? Hydrocodone-Acetaminophen Other (See Comments)  ?  Other reaction(s): "get crazy"  ? Metformin Hcl Other (See Comments)  ?  Other reaction(s): upset stomach  ? Tramadol Hcl Other (See Comments)  ?  Other reaction(s): "get crazy"  ? Azor [Amlodipine-Olmesartan] Swelling, Palpitations and Rash  ?  Amlodipine alone resumed 12/2013, tolerating  ? Lisinopril Cough  ? Penicillins Dermatitis and Other (See Comments)  ?  Did it involve swelling of the face/tongue/throat, SOB, or low BP?  No, just worsened eczema ?Did it involve sudden or severe rash/hives, skin peeling, or any reaction on the inside of your mouth or nose? No ?Did you need to seek medical attention at a hospital or doctor's office? No ?When did it last ha

## 2021-08-14 NOTE — TOC Progression Note (Addendum)
Transition of Care (TOC) - Progression Note  ? ? ?Patient Details  ?Name: [name] ?MRN: [id number] ?Date of Birth: July 15, 1940 ? ?Transition of Care (TOC) CM/SW Contact  ?Carles Collet, RN ?Phone Number: ?08/14/2021, 12:36 PM ? ?Clinical Narrative:   Spoke w patient's daughter to discuss palliative/ hospice follow up at [facility] when she returns.  ?She confirms that the patient is LTC at [place]. ?She is comfortable with referral being made for palliative services, that can progress into hospice services if needed depending on result of outpatient scans that will determine if patient has cancer and to what extent. Discussed providers and ACC chosen. ?Notified Venia Carbon of [facility] of possible DC tomorrow to [place]. ? ? ? ?Expected Discharge Plan: [place] ?Barriers to Discharge: Continued Medical Work up ? ?Expected Discharge Plan and Services ?Expected Discharge Plan: [place] ?In-house Referral: Clinical Social Work ?  ?  ?Living arrangements for the past 2 months: [place] ?                ?  ?  ?  ?  ?  ?  ?[place] Agency: Hospice and [place] ?Date HH Agency Contacted: 08/14/21 ?Time [place]: 9935 ?Representative spoke with at [place]: Venia Carbon ? ? ?Social Determinants of Health (SDOH) Interventions ?  ? ?Readmission Risk Interventions ?No flowsheet data found. ? ?

## 2021-08-14 NOTE — Progress Notes (Signed)
Discharge verbal report provided to nurse Claiborne Billings at [place].   ?Advised that patient will be coming with DNR, hard script and will be arriving by PTAR. ?No ETA for PTAR was provided. ? ? ?

## 2021-08-14 NOTE — Discharge Summary (Signed)
Physician Discharge Summary  ?[name] XVQ:[id number] DOB: 1940-06-26 DOA: 08/12/2021 ? ?PCP: [name], MD ? ?Admit date: 08/12/2021 ?Discharge date: 08/14/2021 ? ?Admitted From: SNF ?Disposition:  SNF with hospice care ? ?Recommendations for Outpatient Follow-up:  ?Follow up with PCP in 1-2 weeks ?Please obtain BMP/CBC in one week ?Follow-up with oncology and discuss about further management ?Hold off Plavix due to increased risk of bleeding ?Hold off Lasix at this time due to renal function ?Continue 2 L of oxygen via nasal cannula ? ?Home Health: None ?Equipment/Devices: None ?Discharge Condition: Fair ?CODE STATUS: DNR ?Diet recommendation: Low-sodium diet ? ?Brief/Interim Summary: ?[name] is a 81 y.o. female with medical history significant of CAD s/p CABG, DM2, HTN, stroke, smoking.Pt presents to ED with c/o CP onset yesterday, became constant this AM Having increasing SOB for past 1 week.Having wt loss for past couple of months per daughter. Recently started on 2L O2 at home due to SOB.  ?  ?Upon arrival to ED: Patient's vital signs stable.  Requiring 2 L of oxygen via nasal cannula.  Afebrile.  COVID negative.  Potassium 5.2.  Creatinine: 3.71, GFR: 12.  CBC shows no leukocytosis, H&H 9.3/28.8.  Lipase: WNL, troponin: WNL.  A1c 5.8% CT chest concerning for bronchogenic carcinoma with malignant pleural effusion.  IR consulted for ultrasound-guided thoracentesis.  [facility] hospitalist consulted for admission for further evaluation and management. ? ?Acute hypoxemic respiratory failure in the setting of lung mass ?Concerning for bronchogenic carcinoma ?Large malignant right pleural effusion ?-Status post right thoracentesis on 3/18 removed 1.3 L of bloody fluid-pending cytology result ?-On 2 L of oxygen via nasal cannula. ?-She remained afebrile with no leukocytosis.  COVID-19 negative.   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  Recommend outpatient PET CT scan and MRI brain for further staging purposes ?-Plavix stopped after discussion with cardiology by PCCM ?-Palliative care consulted-patient and family decided DNR with outpatient hospice.  TOC consulted ?-Appreciated PCCM and palliative care help ?-Okay to discharge from PCCM standpoint ?  ?Right-sided pleural effusion: ?-S/p thoracentesis on 3/18 as above ?-Suspect due to underlying malignancy ?-CT chest after thoracentesis shows decreased but persistent moderate to large right pleural effusion. ?  ?Chronic kidney disease stage IV: ?-Slowly and gradually improving.  Creatinine 2.94 upon admission.  2.57 this morning ?-Discussed with on-call nephrology Dr. Peeples-recommended to continue to monitor renal function and avoid nephrotoxic medications.  No dialysis at this time ?  ?Anemia of chronic disease: ?-In the setting of chronic kidney disease ?-Baseline hemoglobin 10.  Hemoglobin dropped from 9.3-8.5-7.9  ?-Repeat CBC with PCP outpatient ?  ?Hyperkalemia:  ?-Lokelma given ?-Resolved ?  ?Type 2 diabetes mellitus: Well controlled.  A1c 5.8% ?-Started on sensitive sliding scale insulin ?-Resumed home insulin at the time of discharge ?  ?Hypertension: ?-Blood pressure remained stable.  Continued metoprolol, Imdur and amlodipine. ?  ?Hyperlipidemia: Continued with statin ?  ?Tobacco abuse ?Emphysema: ?-Continued albuterol as needed.  Nicotine patch ordered ?  ?Aortic atherosclerosis noted on CT chest. ? ?Plan of care: Per discussion with palliative care: Patient agreed with DNR and outpatient hospice care. ?Patient would like to get further diagnostic imaging to determine type and progression of the cancer but does not desire further treatment at this time. ? ?Discharge Diagnoses:  ?Acute hypoxemic respiratory failure in the setting of lung mass  concerning for  bronchogenic carcinoma ?Large malignant right pleural effusion ?Chronic CKD stage IV ?Anemia of chronic disease ?Hyperkalemia ?Type 2 diabetes mellitus ?Hypertension ?Hyperlipidemia ?Tobacco abuse ?Emphysema ?Aortic atherosclerosis ? ?Discharge Instructions ? ?Discharge Instructions   ? ? Increase activity slowly   Complete by: As directed ?  ? No wound care   Complete by: As directed ?  ? ?  ? ?Allergies as of 08/14/2021   ? ?   Reactions  ? Keflex [cephalexin] Shortness Of Breath, Other (See Comments)  ? Chest pain, headache  ? Lipitor [atorvastatin] Shortness Of Breath, Other (See Comments)  ? Severe pain/cramping in legs- "I cannot walk, talk, or breathe" (pt is currently taking 20 mg daily 10/11/19)  ? Codeine Other (See Comments)  ? Disrupted patient's equilibrium- "Made my world flip upside down"  ? Hydrochlorothiazide Other (See Comments)  ? Other reaction(s): lightheaded, nausea  ? Hydrocodone-acetaminophen Other (See Comments)  ? Other reaction(s): "get crazy"  ? Metformin Hcl Other (See Comments)  ? Other reaction(s): upset stomach  ? Tramadol Hcl Other (See Comments)  ? Other reaction(s): "get crazy"  ? Azor [amlodipine-olmesartan] Swelling, Palpitations, Rash  ? Amlodipine alone resumed 12/2013, tolerating  ? Lisinopril Cough  ? Penicillins Dermatitis, Other (See Comments)  ? Did it involve swelling of the face/tongue/throat, SOB, or low BP? No, just worsened eczema ?Did it involve sudden or severe rash/hives, skin peeling, or any reaction on the inside of your mouth or nose? No ?Did you need to seek medical attention at a hospital or doctor's office? No ?When did it last happen? "More than 10 years ago" ?If all above answers are ?NO?, may proceed with cephalosporin use.  ? ?  ? ?  ?Medication List  ?  ? ?STOP taking these medications   ? ?clopidogrel 75 MG tablet ?Commonly known as: PLAVIX ?  ?furosemide 20 MG tablet ?Commonly known as: LASIX ?  ? ?  ? ?TAKE these medications    ? ?acetaminophen 650 MG CR tablet ?Commonly known as: TYLENOL ?Take 650 mg by mouth at bedtime. ?  ?albuterol 108 (90 Base) MCG/ACT inhaler ?Commonly known as: VENTOLIN HFA ?Inhale 2 puffs into the lungs every 4 (four) hours as needed for wheezing or shortness of breath (or coughing). ?What changed:  ?when to take this ?additional instructions ?  ?amLODipine 10 MG tablet ?Commonly known as: NORVASC ?Take 10 mg by mouth daily. ?  ?aspirin 81 MG EC tablet ?Take 1 tablet (81 mg total) by mouth daily. Swallow whole. ?  ?bisacodyl 10 MG suppository ?Commonly known as: DULCOLAX ?Place 10 mg rectally daily as needed for moderate constipation. ?  ?carvedilol 6.25 MG tablet ?Commonly known as: COREG ?Take 1 tablet (6.25 mg total) by mouth 2 (two) times daily with a meal. ?  ?famotidine 20 MG tablet ?Commonly known as: PEPCID ?Take 20 mg by mouth daily. ?  ?feeding supplement Liqd ?Take 237 mLs by mouth 3 (three) times daily between meals. ?  ?hydrALAZINE 50 MG tablet ?Commonly known as: APRESOLINE ?Take 1 tablet (50 mg total) by mouth every 8 (eight) hours. ?What changed:  ?how much to take ?when to take this ?  ?insulin aspart 100 UNIT/ML FlexPen ?Commonly known as: NOVOLOG ?Before each meal 3 times a day, 140-199 - 2 units, 200-250 - 4 units, 251-299 - 6 units,  300-349 - 8 units,  350 or above 10 units. ?Insulin PEN if approved, provide syringes and needles if needed. ?What changed:  ?how much to take ?when to take this ?  additional instructions ?  ?isosorbide mononitrate 60 MG 24 hr tablet ?Commonly known as: IMDUR ?Take 1 tablet (60 mg total) by mouth daily. ?  ?Lidocaine 4 % Ptch ?Apply 1 patch topically daily. To left hip - am; off qpm ?  ?magnesium hydroxide 400 MG/5ML suspension ?Commonly known as: MILK OF MAGNESIA ?Take 30 mLs by mouth daily as needed for mild constipation. ?  ?metoprolol succinate 25 MG 24 hr tablet ?Commonly known as: TOPROL-XL ?Take 25 mg by mouth daily. ?  ?nicotine 7 mg/24hr patch ?Commonly  known as: NICODERM CQ - dosed in mg/24 hr ?Place 7 mg onto the skin daily. ?  ?nitroGLYCERIN 0.4 MG SL tablet ?Commonly known as: NITROSTAT ?Place 0.4 mg under the tongue every 5 (five) minutes as needed for chest

## 2021-08-14 NOTE — TOC Progression Note (Signed)
Transition of Care (TOC) - Progression Note  ? ? ?Patient Details  ?Name: [name] ?MRN: [id number] ?Date of Birth: 12-16-1940 ? ?Transition of Care (TOC) CM/SW Contact  ?Bary Castilla, LCSW ?Phone Number: [phone] ?08/14/2021, 2:49 PM ? ?Clinical Narrative:    ? ?CSW attempted to reach someone at the facility again and had to leave another message about pt's DC back to facility. ? ?TOC team will continue to assist with discharge planning needs.  ? ?Expected Discharge Plan: [place] ?Barriers to Discharge: Continued Medical Work up ? ?Expected Discharge Plan and Services ?Expected Discharge Plan: [place] ?In-house Referral: Clinical Social Work ?  ?  ?Living arrangements for the past 2 months: [employer] ?                ?  ?  ?  ?  ?  ?  ?[place] Agency: [facility] [place] ?Date HH Agency Contacted: 08/14/21 ?Time [place]: 8377 ?Representative spoke with at [place]: Venia Carbon ? ? ?Social Determinants of Health (SDOH) Interventions ?  ? ?Readmission Risk Interventions ?No flowsheet data found. ? ?

## 2021-08-14 NOTE — Progress Notes (Signed)
?PROGRESS NOTE ? ? ? ?[name]  YHC:[id number] DOB: [birth year] DOA: 08/12/2021 ?PCP: [name], MD ? ? ?Brief Narrative:  ?[name] is a 81 y.o. female with medical history significant of CAD s/p CABG, DM2, HTN, stroke, smoking.Pt presents to ED with c/o CP onset yesterday, became constant this AM Having increasing SOB for past 1 week.Having wt loss for past couple of months per daughter. Recently started on 2L O2 at home due to SOB.  ? ?Upon arrival to ED: Patient's vital signs stable.  Requiring 2 L of oxygen via nasal cannula.  Afebrile.  COVID negative.  Potassium 5.2.  Creatinine: 3.71, GFR: 12.  CBC shows no leukocytosis, H&H 9.3/28.8.  Lipase: WNL, troponin: WNL.  A1c 5.8% CT chest concerning for bronchogenic carcinoma with malignant pleural effusion.  IR consulted for ultrasound-guided thoracentesis.  [facility] hospitalist consulted for admission for further evaluation and management. ? ?Assessment & Plan: ?  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 ?-She is afebrile with no leukocytosis.  COVID-19 negative.   ?-CT chest shows finding consistent with primary bronchogenic carcinoma, large malignant right pleural effusion with a scattered pleural metastasis, invasion of mediastinum  ?-PCCM consulted-recommend referral to oncology outpatient for further management.  Recommend outpatient PET CT scan and MRI brain for further staging purposes ?-Plavix stopped after discussion with cardiology by PCCM ?-Palliative care consulted-patient and family decided DNR with outpatient hospice.  TOC consulted ?-Appreciate PCCM and palliative care help ? ?Right-sided pleural effusion: ?-S/p thoracentesis on 3/18 as above ?-Suspect due to underlying malignancy ?-CT  chest after thoracentesis shows decreased but persistent moderate to large right pleural effusion. ? ?Chronic kidney disease stage IV: ?-Slowly and gradually improving.  Creatinine 2.94 upon admission.  2.57 this morning ?-Discussed with on-call nephrology Dr. Peeples-recommended to continue to monitor renal function and avoid nephrotoxic medications.  No dialysis at this time ?-Monitor BMP closely ? ?Anemia of chronic disease: ?-In the setting of chronic kidney disease ?-Baseline hemoglobin 10.  Hemoglobin dropped from 9.3-8.5 this morning. ?-Monitor H&H closely and transfuse as needed. ? ?Hyperkalemia:  ?-Lokelma given ?-Resolved ? ?Type 2 diabetes mellitus: Well controlled.  A1c 5.8% ?-Continue sensitive sliding scale insulin ? ?Hypertension: ?-Continue metoprolol, Imdur and amlodipine. ? ?Hyperlipidemia: Continue with statin ? ?Tobacco abuse ?Emphysema: ?-Continue albuterol as needed.  Nicotine patch ordered ? ?Aortic atherosclerosis noted on CT chest. ? ?Per discussion with palliative care: Patient agreed with DNR and outpatient hospice care. ?Patient would like to get further diagnostic imaging to determine type and progression of the cancer but does not desire further treatment at this time. ? ?Patient came from [facility].  Social worker attempted to reach at the facility multiple times with no response.  Likely discharge tomorrow ? ?DVT prophylaxis: SCD ?Code Status: DNR  ?family Communication:  None present at bedside.  Plan of care discussed with patient in length and she verbalized understanding and agreed with it. ? ?I called patient's daughter [name] and discussed plan of care and she verbalized understanding. ? ?Disposition Plan: Discharge to SNF tomorrow with hospice  ? ?consultants:  ?PCCM ?Nephrology ?Palliative care ?Procedures:  ?Thoracentesis ? ?Antimicrobials:  ?None ? ?Status is: Observation ? ? ? ? ?Subjective: ?Patient seen and examined.  Reports improvement in breathing.  No fever,  chills.  No chest pain.  No acute events overnight. ? ?Objective: ?Vitals:  ? 08/13/21 2333 08/14/21 0343 08/14/21 0826 08/14/21 1204  ?BP: (!) 130/54 121/61 127/66 (!) 125/57  ?Pulse: 62 71 72 73  ?Resp: 20 17 17 17   ?Temp: (!) 97.2 ?F (36.2 ?C) 98.2 ?F (36.8 ?C) 97.6 ?F (36.4 ?C) 98.4 ?F (36.9 ?C)  ?TempSrc: Oral Oral Oral Oral  ?SpO2: 95% 100% 100% 98%  ?Weight:      ?Height:      ? ? ?Intake/Output Summary (Last 24 hours) at 08/14/2021 1450 ?Last data filed at 08/14/2021 1400 ?Gross per 24 hour  ?Intake 600 ml  ?Output 450 ml  ?Net 150 ml  ? ? ?Filed Weights  ? 08/12/21 2305  ?Weight: 65.6 kg  ? ? ?Examination: ? ?General exam: Appears calm and comfortable, on nasal cannula, ill-appearing 81 year old female, thin and lean, communicating well ?Respiratory system: decreased breath sounds on right side.  No wheezing, rhonchi.  Cardiovascular system: S1 & S2 heard, RRR. No JVD, murmurs, rubs, gallops or clicks. No pedal edema. ?Gastrointestinal system: Abdomen is nondistended, soft and nontender. No organomegaly or masses felt. Normal bowel sounds heard. ?Central nervous system: Alert and oriented. No focal neurological deficits. ?Extremities: Symmetric 5 x 5 power. ?Skin: No rashes, lesions or ulcers ?Psychiatry: Judgement and insight appear normal. Mood & affect appropriate.  ? ? ?Data Reviewed: I have personally reviewed following labs and imaging studies ? ?CBC: ?Recent Labs  ?Lab 08/12/21 ?1549 08/13/21 ?0210 08/14/21 ?0813  ?WBC 8.7 7.5 6.6  ?NEUTROABS 6.4  --   --   ?HGB 9.3* 8.5* 7.9*  ?HCT 28.8* 26.2* 25.5*  ?MCV 90.3 89.4 92.1  ?PLT 189 181 160  ? ? ?Basic Metabolic Panel: ?Recent Labs  ?Lab 08/12/21 ?1549 08/13/21 ?0210 08/14/21 ?0813  ?NA 136 139 138  ?K 5.2* 5.3* 5.1  ?CL 108 110 112*  ?CO2 18* 19* 17*  ?GLUCOSE 132* 132* 84  ?BUN 34* 35* 32*  ?CREATININE 2.94* 2.86* 2.57*  ?CALCIUM 9.1 9.4 8.8*  ?MG  --   --  2.7*  ? ? ?GFR: ?Estimated Creatinine Clearance: 15.8 mL/min (A) (by C-G formula based on SCr of  2.57 mg/dL (H)). ?Liver Function Tests: ?Recent Labs  ?Lab 08/12/21 ?1549 08/13/21 ?0210  ?AST 20 16  ?ALT 9 9  ?ALKPHOS 47 46  ?BILITOT 0.4 0.4  ?PROT 7.6 7.0  7.1  ?ALBUMIN 3.4* 3.1*  ? ? ?Recent Labs  ?Lab 08/12/21 ?1549  ?LIPASE 29  ? ? ?No results for input(s): AMMONIA in the last 168 hours. ?Coagulation Profile: ?No results for input(s): INR, PROTIME in the last 168 hours. ?Cardiac Enzymes: ?No results for input(s): CKTOTAL, CKMB, CKMBINDEX, TROPONINI in the last 168 hours. ?BNP (last 3 results) ?No results for input(s): PROBNP in the last 8760 hours. ?HbA1C: ?Recent Labs  ?  08/12/21 ?1549  ?HGBA1C 5.8*  ? ? ?CBG: ?Recent Labs  ?Lab 08/13/21 ?1611 08/13/21 ?1926 08/13/21 ?2219 08/14/21 ?1062 08/14/21 ?1202  ?GLUCAP 116* 104* 125* 81 124*  ? ? ?Lipid Profile: ?No results for input(s): CHOL, HDL, LDLCALC, TRIG, CHOLHDL, LDLDIRECT in the last 72 hours. ?Thyroid Function Tests: ?No results for input(s): TSH, T4TOTAL, FREET4, T3FREE, THYROIDAB in the last 72 hours. ?Anemia Panel: ?No results for input(s): VITAMINB12, FOLATE, FERRITIN, TIBC, IRON, RETICCTPCT in the last 72 hours. ?Sepsis Labs: ?No results for input(s): PROCALCITON, LATICACIDVEN in the last 168 hours. ? ?Recent Results (from the past 240 hour(s))  ?Resp Panel by RT-PCR (Flu A&B, Covid) Nasopharyngeal Swab  Status: None  ? Collection Time: 08/12/21  3:47 PM  ? Specimen: Nasopharyngeal Swab; Nasopharyngeal(NP) swabs in vial transport medium  ?Result Value Ref Range Status  ? SARS Coronavirus 2 by RT PCR NEGATIVE NEGATIVE Final  ?  Comment: (NOTE) ?SARS-CoV-2 target nucleic acids are NOT DETECTED. ? ?The SARS-CoV-2 RNA is generally detectable in upper respiratory ?specimens during the acute phase of infection. The lowest ?concentration of SARS-CoV-2 viral copies this assay can detect is ?138 copies/mL. A negative result does not preclude SARS-Cov-2 ?infection and should not be used as the sole basis for treatment or ?other patient management  decisions. A negative result may occur with  ?improper specimen collection/handling, submission of specimen other ?than nasopharyngeal swab, presence of viral mutation(s) within the ?areas targeted by this assay

## 2021-08-14 NOTE — Progress Notes (Addendum)
Patient setting in chair with tele. Off O2 off. She is refusing to get in bed and will only let Jenn R.N. from day shift put monitor and O2 back on her .Does not want me to touch her at this time.She is confused  and paranoid .knows name and birthday. Thinks she is at nursing home. V.S. S.  And CBG. normal. Will not let N.T. touch her at this time. Daughter was called and left message at patient request. Cont. To monitor patient. Tim R.N. aware. ?

## 2021-08-14 NOTE — Progress Notes (Signed)
Patient in chair with monitor off, O2 off and no pure wick. Still confused  and paranoid but more cooperative. Put tele.,O2 sat back on and assist to bed.Pure wick inserted and gave 2 Tylenol for back pain and back rub. Now alert and oriented to person and place. Still confused to date and situation. ?

## 2021-08-14 NOTE — Progress Notes (Signed)
Jerrye Beavers R.N. from [facility] called to check on patient, and stated that patient does have [employer] at night she gets confused and restless. Sometimes has to fed patient to get her to eat. ?

## 2021-08-14 NOTE — TOC Transition Note (Addendum)
Transition of Care (TOC) - CM/SW Discharge Note ? ? ?Patient Details  ?Name: [name] ?MRN: [id number] ?Date of Birth: 31-Jan-1941 ? ?Transition of Care (TOC) CM/SW Contact:  ?Bary Castilla, LCSW ?Phone Number: [phone] ?08/14/2021, 4:21 PM ? ? ?Clinical Narrative:    ?Patient will DC to:?[facility] ?Anticipated DC date:?08/14/2021 ?Family notified:?[name] ?Transport by: [name] ?  ?Per MD patient ready for DC to [facility] RN, patient, patient's family, and facility notified of DC. Discharge Summary sent to facility per fa and hard copy. RN given number for report  [phone]  room 316B DC packet on chart. Ambulance transport requested for patient.  ? ?CSW signing off. ?  ?Vallery Ridge, LCSW ?[phone]  ? ? ?Final next level of care: [place] ?Barriers to Discharge: Continued Medical Work up ? ? ?Patient Goals and CMS Choice ?  ?  ?  ? ?Discharge Placement ?  ?           ?  ?  ?  ?  ? ?Discharge Plan and Services ?In-house Referral: Clinical Social Work ?  ?           ?  ?  ?  ?  ?  ?  ?[place] Agency: [facility] [place] ?Date HH Agency Contacted: 08/14/21 ?Time [place]: 9024 ?Representative spoke with at [place]: Venia Carbon ? ?Social Determinants of Health (SDOH) Interventions ?  ? ? ?Readmission Risk Interventions ?No flowsheet data found. ? ? ? ? ?

## 2021-08-14 NOTE — TOC Progression Note (Signed)
Transition of Care (TOC) - Progression Note  ? ? ?Patient Details  ?Name: [name] ?MRN: [id number] ?Date of Birth: [birth year] ? ?Transition of Care (TOC) CM/SW Contact  ?Vinie Sill, LCSW ?Phone Number: ?08/14/2021, 12:38 PM ? ?Clinical Narrative:    ? ?CSW left voice message with [facility] to contact CSW Lattie Haw to confirmed if patient can return today.  ? ?Thurmond Butts, MSW, LCSW ?Clinical Social Worker ? ? ? ?Expected Discharge Plan: [place] ?Barriers to Discharge: Continued Medical Work up ? ?Expected Discharge Plan and Services ?Expected Discharge Plan: [place] ?In-house Referral: Clinical Social Work ?  ?  ?Living arrangements for the past 2 months: [place] ?                ?  ?  ?  ?  ?  ?  ?[place] Agency: [facility] [place] ?Date HH Agency Contacted: 08/14/21 ?Time [place]: 4935 ?Representative spoke with at [place]: Venia Carbon ? ? ?Social Determinants of Health (SDOH) Interventions ?  ? ?Readmission Risk Interventions ?No flowsheet data found. ? ?

## 2021-08-14 NOTE — TOC Initial Note (Signed)
Transition of Care (TOC) - Initial/Assessment Note  ? ? ?Patient Details  ?Name: [name] ?MRN: [id number] ?Date of Birth: 1940-08-20 ? ?Transition of Care (TOC) CM/SW Contact:    ?Vinie Sill, LCSW ?Phone Number: ?08/14/2021, 12:04 PM ? ?Clinical Narrative:                 ? ?CSW spoke with patient's daughter ,[name],via phone- CSW introduced self and explained role. [name] confirmed patient is from [place] and expects her to return once medically stable. No questions or concerns at this time.  ? ?TOC will continue to follow and assist with discharge planning. ? ?Thurmond Butts, MSW, LCSW ?Clinical Social Worker ? ? ? ?Expected Discharge Plan: [place] ?Barriers to Discharge: Continued Medical Work up ? ? ?Patient Goals and CMS Choice ?  ?  ?  ? ?Expected Discharge Plan and Services ?Expected Discharge Plan: [place] ?In-house Referral: Clinical Social Work ?  ?  ?Living arrangements for the past 2 months: [place] ?                ?  ?  ?  ?  ?  ?  ?  ?  ?  ?  ? ?Prior Living Arrangements/Services ?Living arrangements for the past 2 months: [place] ?Lives with:: Facility Resident, Self ?Patient language and need for interpreter reviewed:: No ?       ?Need for Family Participation in Patient Care: Yes (Comment) ?Care giver support system in place?: Yes (comment) ?  ?Criminal Activity/Legal Involvement Pertinent to Current Situation/Hospitalization: No - Comment as needed ? ?Activities of Daily Living ?Home Assistive Devices/Equipment: Electric scooter, Oxygen, Shower chair without back, Raised toilet seat with rails, Grab bars around toilet, Grab bars in shower, Hand-held shower hose, Eyeglasses, Cane (specify quad or straight), Walker (specify type) ?ADL Screening (condition at time of admission) ?Patient's cognitive ability adequate to safely complete daily activities?: Yes ?Is the patient deaf or have difficulty hearing?:  Yes ?Does the patient have difficulty seeing, even when wearing glasses/contacts?: No ?Does the patient have difficulty concentrating, remembering, or making decisions?: Yes ?Patient able to express need for assistance with ADLs?: Yes ?Does the patient have difficulty dressing or bathing?: Yes (must sit while dressing and bathing) ?Independently performs ADLs?: Yes (appropriate for developmental age) ?Does the patient have difficulty walking or climbing stairs?: Yes ?Weakness of Legs: Both ?Weakness of Arms/Hands: Both ? ?Permission Sought/Granted ?  ?  ?   ?   ?   ?   ? ?Emotional Assessment ?  ?  ?  ?Orientation: : Oriented to Self, Oriented to Place, Oriented to Situation ?Alcohol / Substance Use: Not Applicable ?Psych Involvement: No (comment) ? ?Admission diagnosis:  Lung cancer ([place]) [C34.90] ?Patient Active Problem List  ? Diagnosis Date Noted  ? Lung cancer ([place]) 08/12/2021  ? COVID 03/18/2021  ? Hypoglycemia   ? Depression, major, single episode, moderate ([place]) 03/09/2021  ? AKI (acute kidney injury) ([place]) 03/03/2021  ? Acute blood loss anemia 01/28/2021  ? 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 NSTEMI (non-ST elevated myocardial infarction) ([place]) 01/10/2021  ? CAD (coronary artery disease) 01/10/2021  ? CVA (cerebral vascular accident) ([place]) 10/11/2019  ? Hypertensive urgency 04/07/2019  ? CKD (chronic kidney disease) stage 3, GFR 30-59 ml/min (HCC) 04/07/2019  ? Chest pain 04/28/2017  ? History of ST elevation myocardial infarction (STEMI) 04/28/2017  ? Grief reaction 04/28/2017  ? 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 Coronary artery disease involving native coronary artery of native heart with angina pectoris ([place]) 04/16/2015  ? History of stroke 04/16/2015  ? 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 Acute CVA (cerebrovascular  accident) ([place]) 02/19/2015  ? Transient ischemic attack (TIA) 02/18/2015  ? Stroke [facility])   ? Cerebral infarction due to thrombosis of right middle cerebral artery ([place])   ? Acute myocardial infarction of inferolateral wall (HCC) 04/12/2014  ? Numbness 03/18/2014  ? TIA (transient ischemic attack) 03/18/2014  ? DM (diabetes mellitus), secondary, with peripheral vascular complications ([place]) 42/68/3419  ? Hyperlipidemia   ? Hypertension   ? PVD (peripheral vascular disease) ([place]) 03/05/2014  ? Transient cerebral ischemia 08/01/2012  ? Atherosclerosis of native arteries of extremity with intermittent claudication ([place]) 10/19/2011  ? Unspecified disorders of arteries and arterioles 10/19/2011  ? PAD (peripheral artery disease) ([place]) 04/13/2011  ? ?PCP:  [name], MD ?Pharmacy:   ?[id number] - [place], [address] ?[place] ?[place] [address] ?Phone: [phone] Fax: [phone] ? ? ? ? ?Social Determinants of Health (SDOH) Interventions ?  ? ?Readmission Risk Interventions ?No flowsheet data found. ? ? ?

## 2021-08-15 ENCOUNTER — Encounter: Payer: Self-pay | Admitting: *Deleted

## 2021-08-15 DIAGNOSIS — M6281 Muscle weakness (generalized): Secondary | ICD-10-CM | POA: Diagnosis not present

## 2021-08-15 DIAGNOSIS — C3491 Malignant neoplasm of unspecified part of right bronchus or lung: Secondary | ICD-10-CM | POA: Diagnosis not present

## 2021-08-15 DIAGNOSIS — I739 Peripheral vascular disease, unspecified: Secondary | ICD-10-CM | POA: Diagnosis not present

## 2021-08-15 DIAGNOSIS — J439 Emphysema, unspecified: Secondary | ICD-10-CM | POA: Diagnosis not present

## 2021-08-15 DIAGNOSIS — N1832 Chronic kidney disease, stage 3b: Secondary | ICD-10-CM | POA: Diagnosis not present

## 2021-08-15 DIAGNOSIS — I2510 Atherosclerotic heart disease of native coronary artery without angina pectoris: Secondary | ICD-10-CM | POA: Diagnosis not present

## 2021-08-15 DIAGNOSIS — J9610 Chronic respiratory failure, unspecified whether with hypoxia or hypercapnia: Secondary | ICD-10-CM | POA: Diagnosis not present

## 2021-08-15 DIAGNOSIS — R2681 Unsteadiness on feet: Secondary | ICD-10-CM | POA: Diagnosis not present

## 2021-08-15 DIAGNOSIS — E1159 Type 2 diabetes mellitus with other circulatory complications: Secondary | ICD-10-CM | POA: Diagnosis not present

## 2021-08-15 DIAGNOSIS — I152 Hypertension secondary to endocrine disorders: Secondary | ICD-10-CM | POA: Diagnosis not present

## 2021-08-15 DIAGNOSIS — C3490 Malignant neoplasm of unspecified part of unspecified bronchus or lung: Secondary | ICD-10-CM | POA: Diagnosis not present

## 2021-08-15 DIAGNOSIS — Z951 Presence of aortocoronary bypass graft: Secondary | ICD-10-CM | POA: Diagnosis not present

## 2021-08-15 DIAGNOSIS — R2689 Other abnormalities of gait and mobility: Secondary | ICD-10-CM | POA: Diagnosis not present

## 2021-08-15 DIAGNOSIS — E1151 Type 2 diabetes mellitus with diabetic peripheral angiopathy without gangrene: Secondary | ICD-10-CM | POA: Diagnosis not present

## 2021-08-15 DIAGNOSIS — K219 Gastro-esophageal reflux disease without esophagitis: Secondary | ICD-10-CM | POA: Diagnosis not present

## 2021-08-15 NOTE — Progress Notes (Signed)
I received referral on Ms. Harkin today.  I called [facility] to schedule but was unable to reach their transportation. I did leave a vm message with my name and phone number to call.  ?

## 2021-08-16 ENCOUNTER — Encounter: Payer: Self-pay | Admitting: *Deleted

## 2021-08-16 DIAGNOSIS — E119 Type 2 diabetes mellitus without complications: Secondary | ICD-10-CM | POA: Diagnosis not present

## 2021-08-16 DIAGNOSIS — H524 Presbyopia: Secondary | ICD-10-CM | POA: Diagnosis not present

## 2021-08-16 DIAGNOSIS — R2689 Other abnormalities of gait and mobility: Secondary | ICD-10-CM | POA: Diagnosis not present

## 2021-08-16 DIAGNOSIS — E1151 Type 2 diabetes mellitus with diabetic peripheral angiopathy without gangrene: Secondary | ICD-10-CM | POA: Diagnosis not present

## 2021-08-16 DIAGNOSIS — M6281 Muscle weakness (generalized): Secondary | ICD-10-CM | POA: Diagnosis not present

## 2021-08-16 DIAGNOSIS — R2681 Unsteadiness on feet: Secondary | ICD-10-CM | POA: Diagnosis not present

## 2021-08-16 DIAGNOSIS — H25813 Combined forms of age-related cataract, bilateral: Secondary | ICD-10-CM | POA: Diagnosis not present

## 2021-08-16 LAB — BODY FLUID CULTURE W GRAM STAIN: Culture: NO GROWTH

## 2021-08-16 NOTE — Progress Notes (Signed)
Oncology Nurse Navigator Documentation ? ?Oncology Nurse Navigator Flowsheets 08/16/2021  ?Navigator Follow Up Date: 08/17/2021  ?Navigator Follow Up Reason: Appointment Review  ?Navigator Location [facility]  ?Referral Date to RadOnc/MedOnc 08/15/2021  ?Navigator Encounter Type Telephone  ?Barriers/Navigation Needs Coordination of Care/I called [facility] to schedule [name] to be seen with Dr. Julien Nordmann. I was updated that patient does not reside there.  Will look at chart info to see where patient resides.   ?Interventions Coordination of Care  ?Acuity Level 2-Minimal Needs (1-2 Barriers Identified)  ?Coordination of Care Other  ?Time Spent with Patient 30  ?  ?

## 2021-08-17 ENCOUNTER — Encounter: Payer: Self-pay | Admitting: *Deleted

## 2021-08-17 NOTE — Progress Notes (Signed)
Oncology Nurse Navigator Documentation ? ? ?  08/17/2021  ?  2:00 PM 08/16/2021  ? 10:00 AM  ?Oncology Nurse Navigator Flowsheets  ?Navigator Follow Up Date:  08/17/2021  ?Navigator Follow Up Reason:  Appointment Review  ?Navigator Location [facility]  ?Referral Date to RadOnc/MedOnc  08/15/2021  ?Navigator Encounter Type Telephone Telephone  ?Telephone Outgoing Call   ?Barriers/Navigation Needs Coordination of Care/I called [facility] facility to coordinate an appt for [name]. I was told patient is on hospice but the daughter may want patient to be seen to talk about dx. I asked the facility to have the daughter call me.  I gave them my name and phone number.  Coordination of Care  ?Interventions Coordination of Care Coordination of Care  ?Acuity Level 2-Minimal Needs (1-2 Barriers Identified) Level 2-Minimal Needs (1-2 Barriers Identified)  ?Coordination of Care Other Other  ?Time Spent with Patient 45 30  ?  ?

## 2021-08-18 LAB — CYTOLOGY - NON PAP

## 2021-08-19 DIAGNOSIS — I10 Essential (primary) hypertension: Secondary | ICD-10-CM | POA: Diagnosis not present

## 2021-08-23 LAB — MISC LABCORP TEST (SEND OUT)
LabCorp test name: 5367
Labcorp test code: 9985

## 2021-08-27 DEATH — deceased

## 2021-08-30 ENCOUNTER — Encounter: Payer: Self-pay | Admitting: *Deleted

## 2021-08-30 NOTE — Progress Notes (Signed)
Oncology Nurse Navigator Documentation ? ? ?  08/30/2021  ? 10:00 AM 08/17/2021  ?  2:00 PM 08/16/2021  ? 10:00 AM  ?Oncology Nurse Navigator Flowsheets  ?Navigator Follow Up Date:   08/17/2021  ?Navigator Follow Up Reason:   Appointment Review  ?Navigation Complete Date: 08/30/2021    ?Post Navigation: Continue to Follow Patient? No    ?Reason Not Navigating Patient: Hospice/Death    ?Navigator Location [facility]  ?Referral Date to RadOnc/MedOnc   08/15/2021  ?Navigator Encounter Type Telephone/I called [facility] to get an update on [name].  I was told she passed away.  I will notify HIM dept of her passing.  Telephone Telephone  ?Telephone Outgoing Call [place] Call   ?Barriers/Navigation Needs Coordination of Care Coordination of Care Coordination of Care  ?Interventions Coordination of Care Coordination of Care Coordination of Care  ?Acuity Level 2-Minimal Needs (1-2 Barriers Identified) Level 2-Minimal Needs (1-2 Barriers Identified) Level 2-Minimal Needs (1-2 Barriers Identified)  ?Coordination of Care Other Other Other  ?Time Spent with Patient 45 45 30  ?  ?

## 2023-03-11 IMAGING — CR DG CHEST 2V
2 series · 2 of 2 positions shown · non-contrast
Comparison: 01/19/2021

CLINICAL DATA: Post cardiac surgery 01/17/2021

EXAM:
CHEST - 2 VIEW

[chest lat]
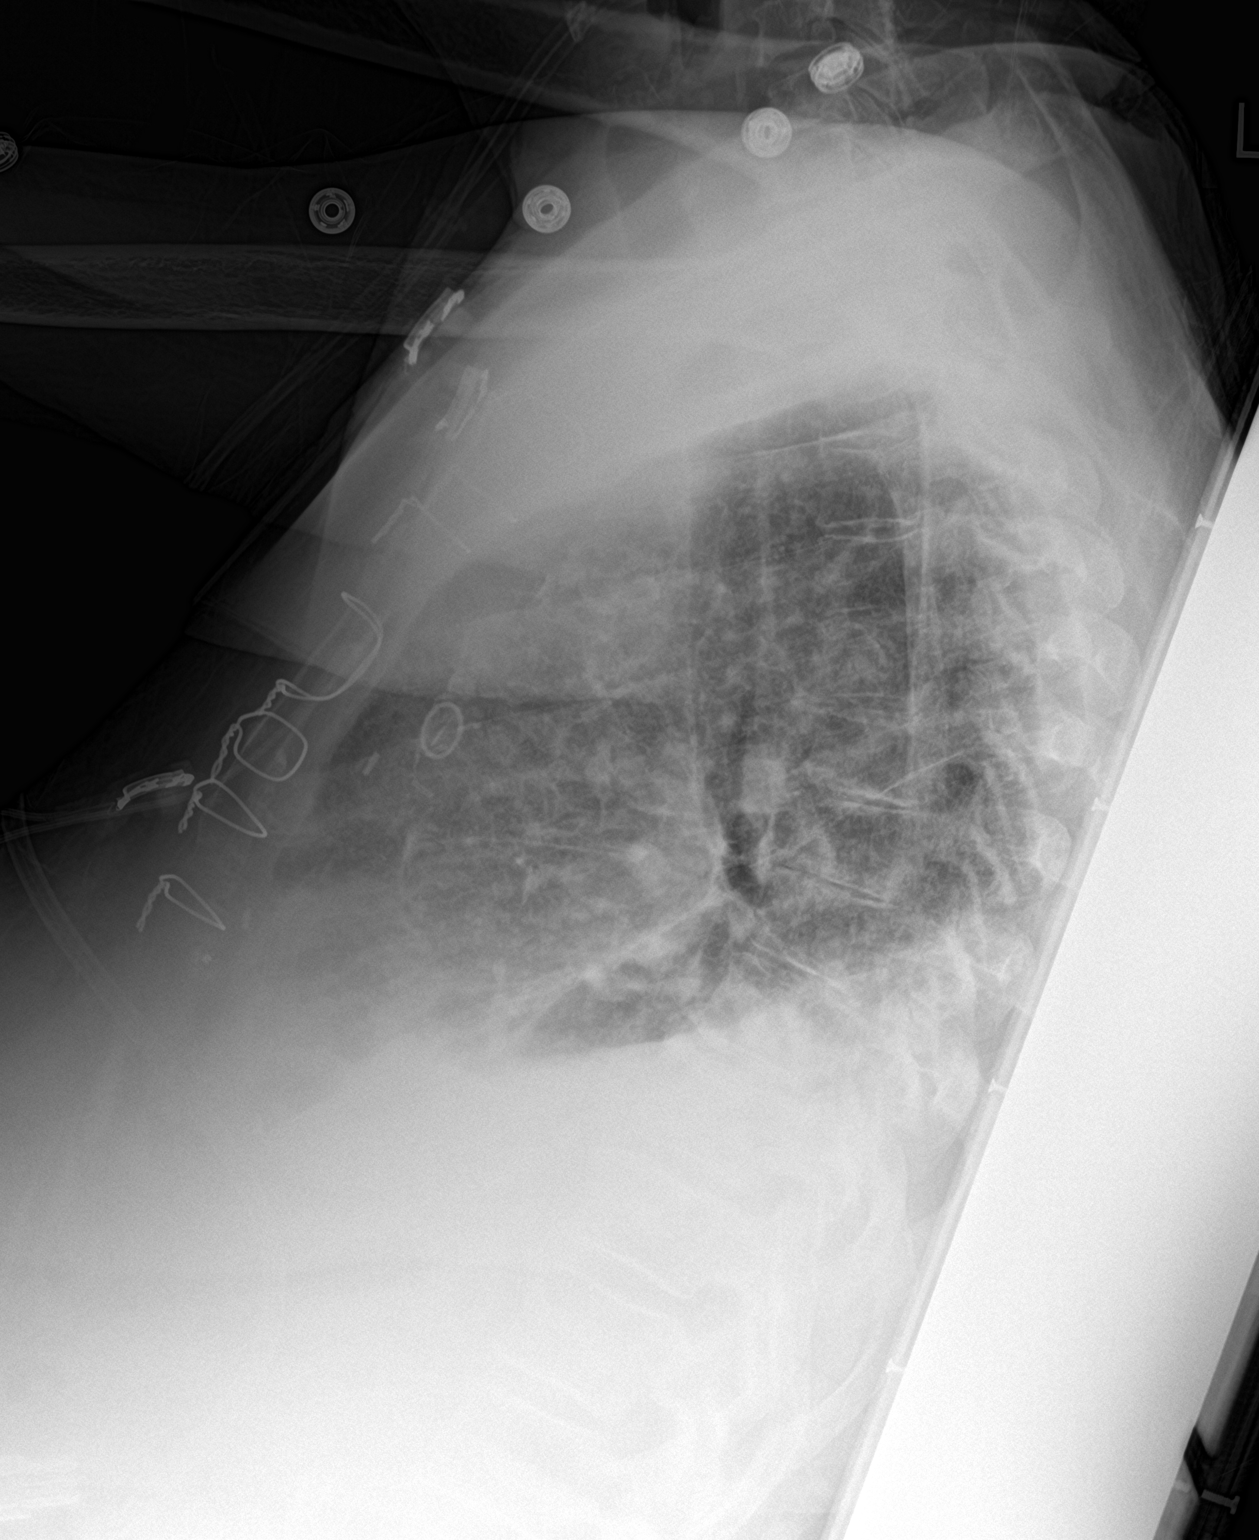

[chest ap]
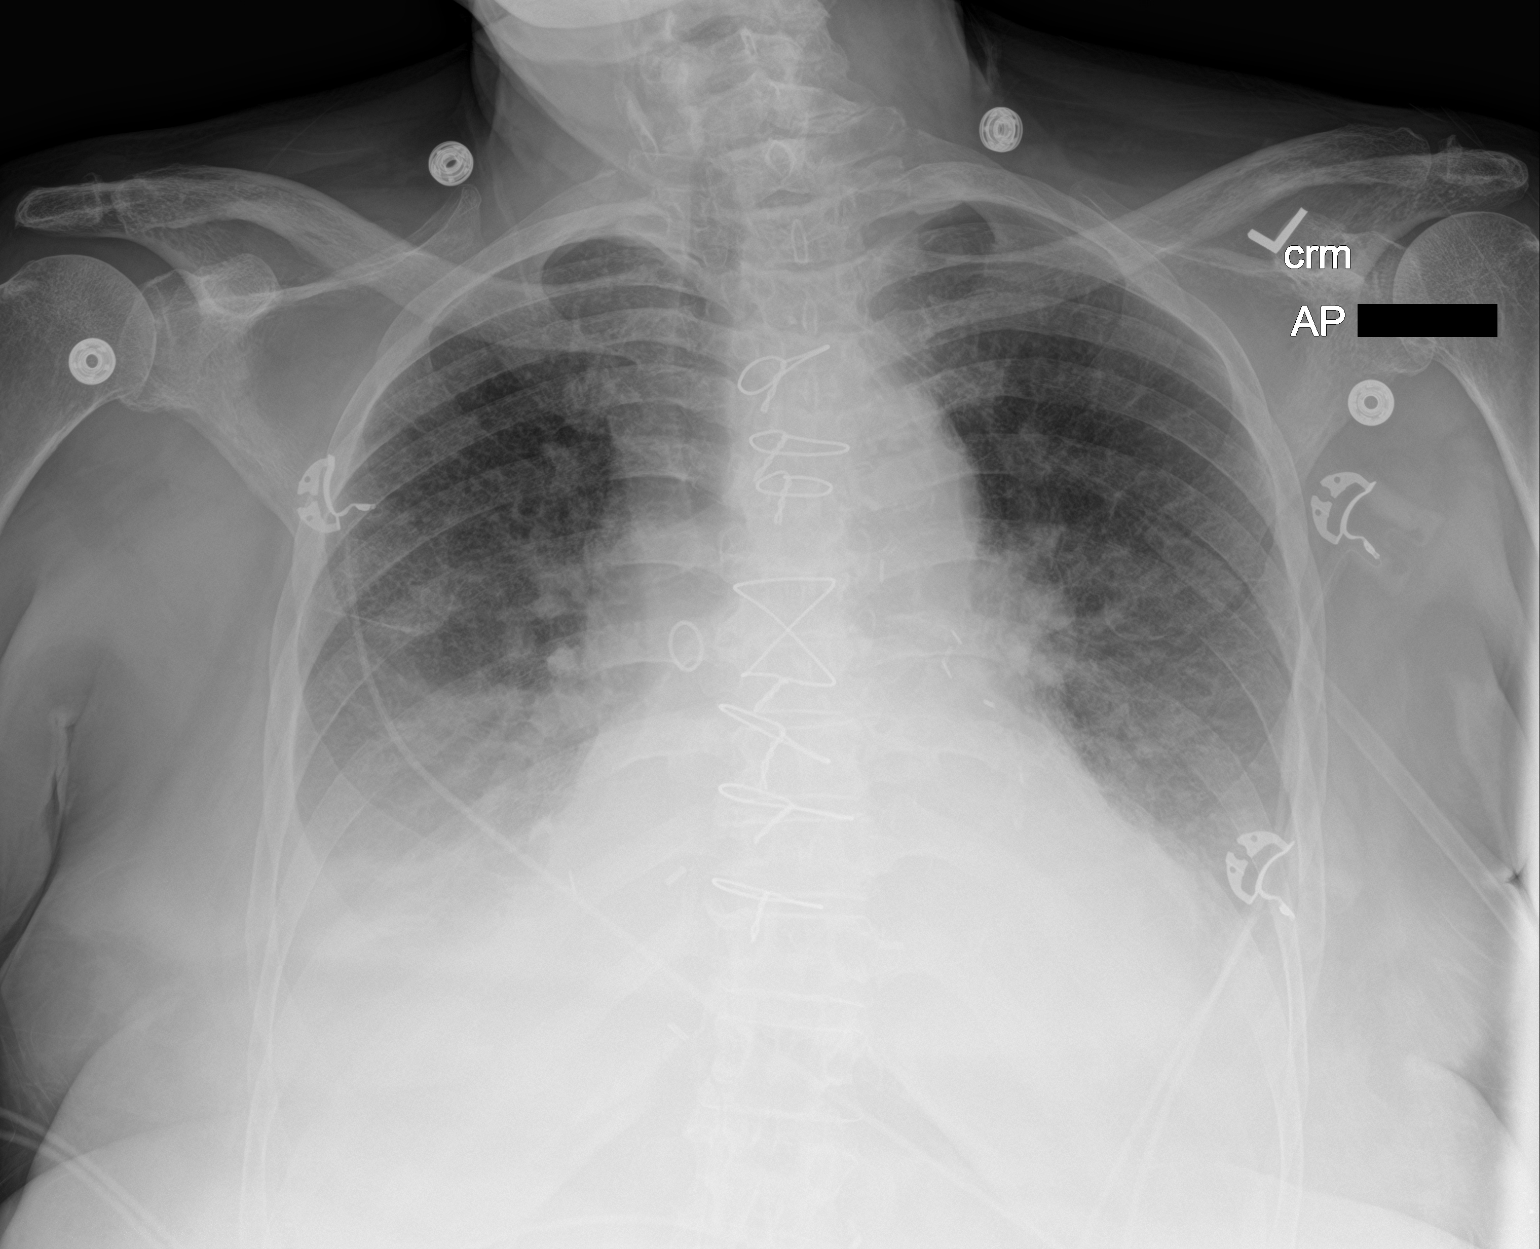

[2 of 2 positions shown; findings below may reference images not displayed]

FINDINGS: Enlargement of cardiac silhouette post CABG.

Mediastinal contours and pulmonary vascularity normal.

Persistent perihilar infiltrates likely pulmonary edema.

Bibasilar pleural effusions and atelectasis greater on RIGHT.

No pneumothorax.
IMPRESSION: Enlargement of cardiac silhouette with probable mild persistent
pulmonary edema post CABG.

Bibasilar pleural effusions and atelectasis, slightly greater on
RIGHT, not significantly changed.

## 2023-03-13 IMAGING — CR DG CHEST 2V
2 series · 2 of 2 positions shown · non-contrast
Comparison: January 25, 2021.

CLINICAL DATA: Status post coronary bypass graft.

EXAM:
CHEST - 2 VIEW

[chest lat]
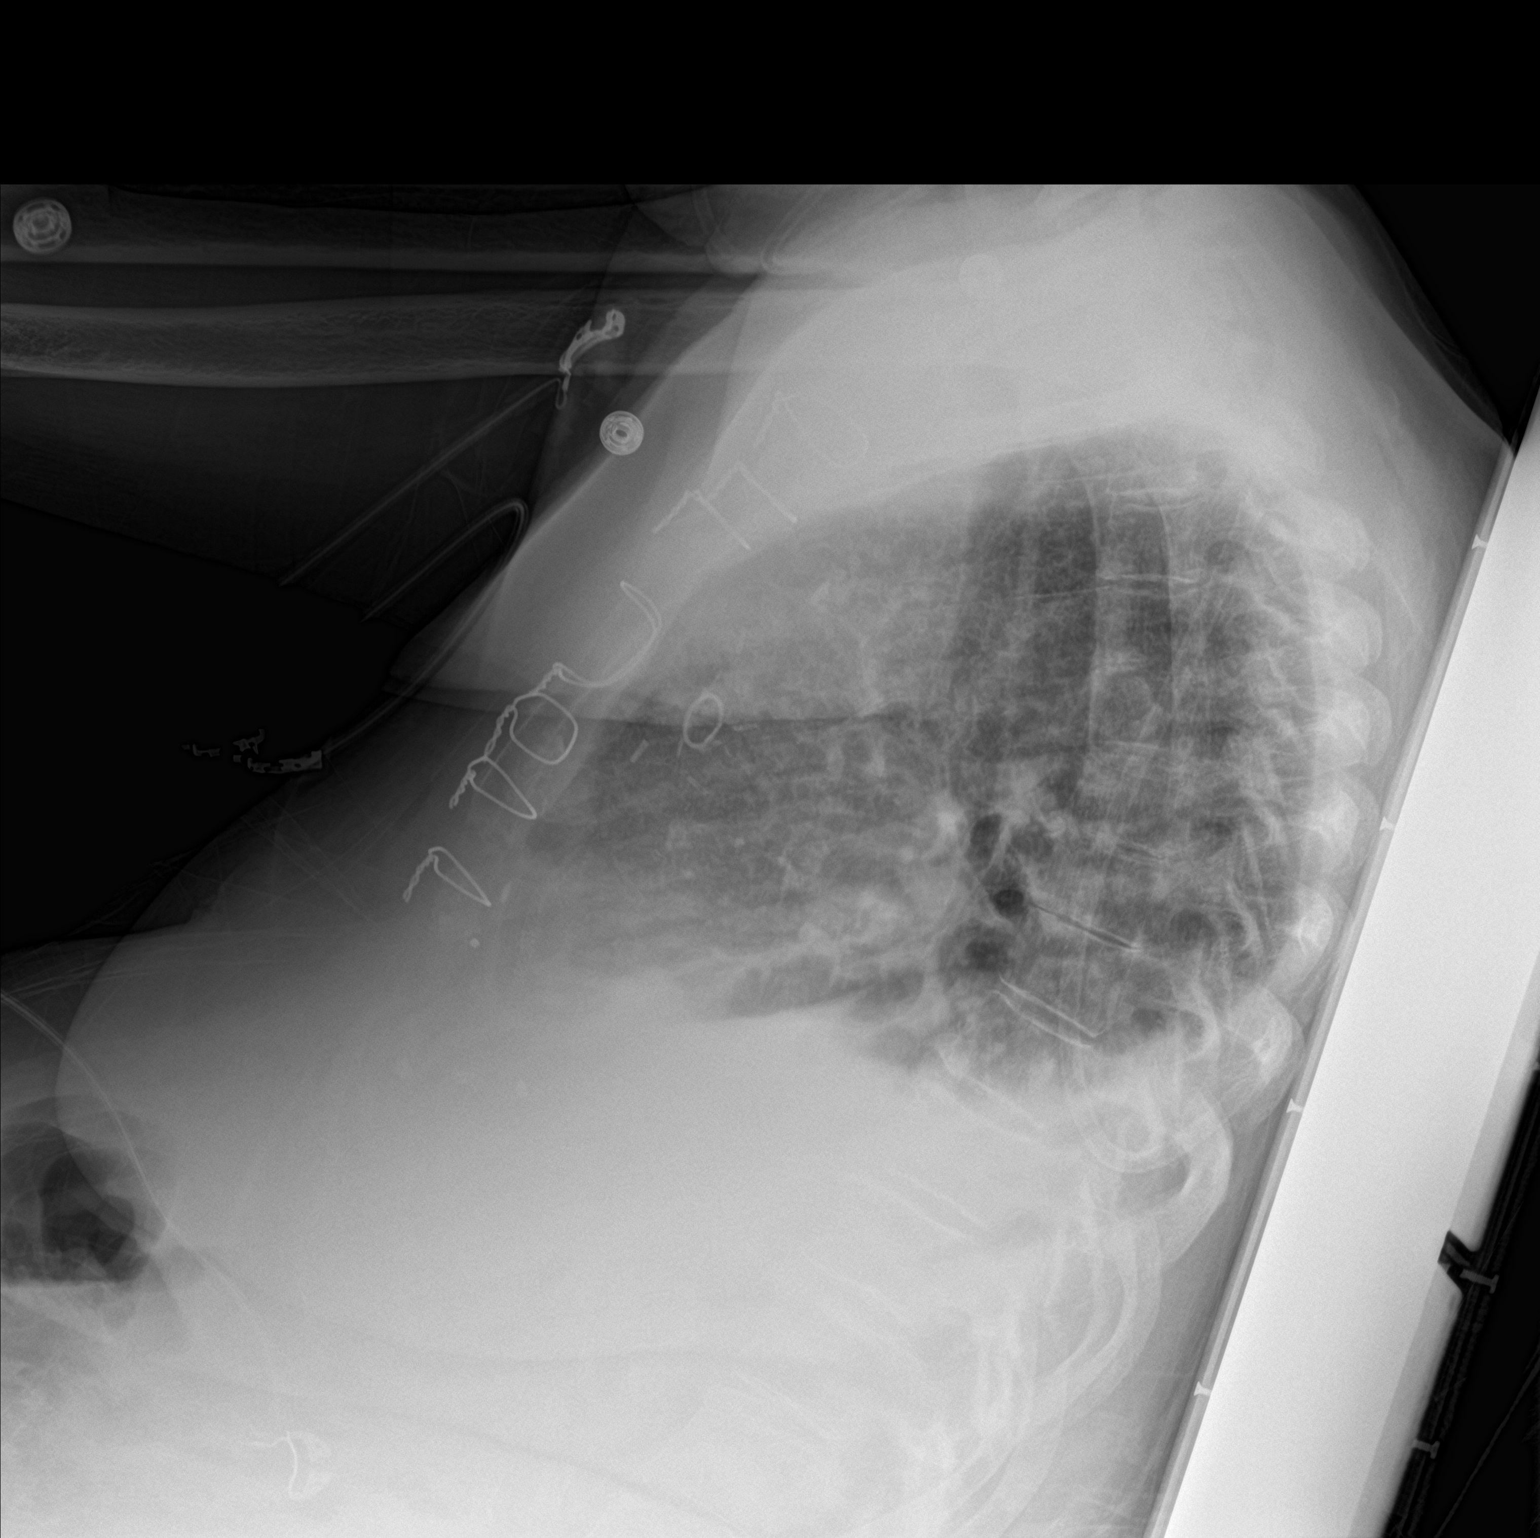

[chest ap]
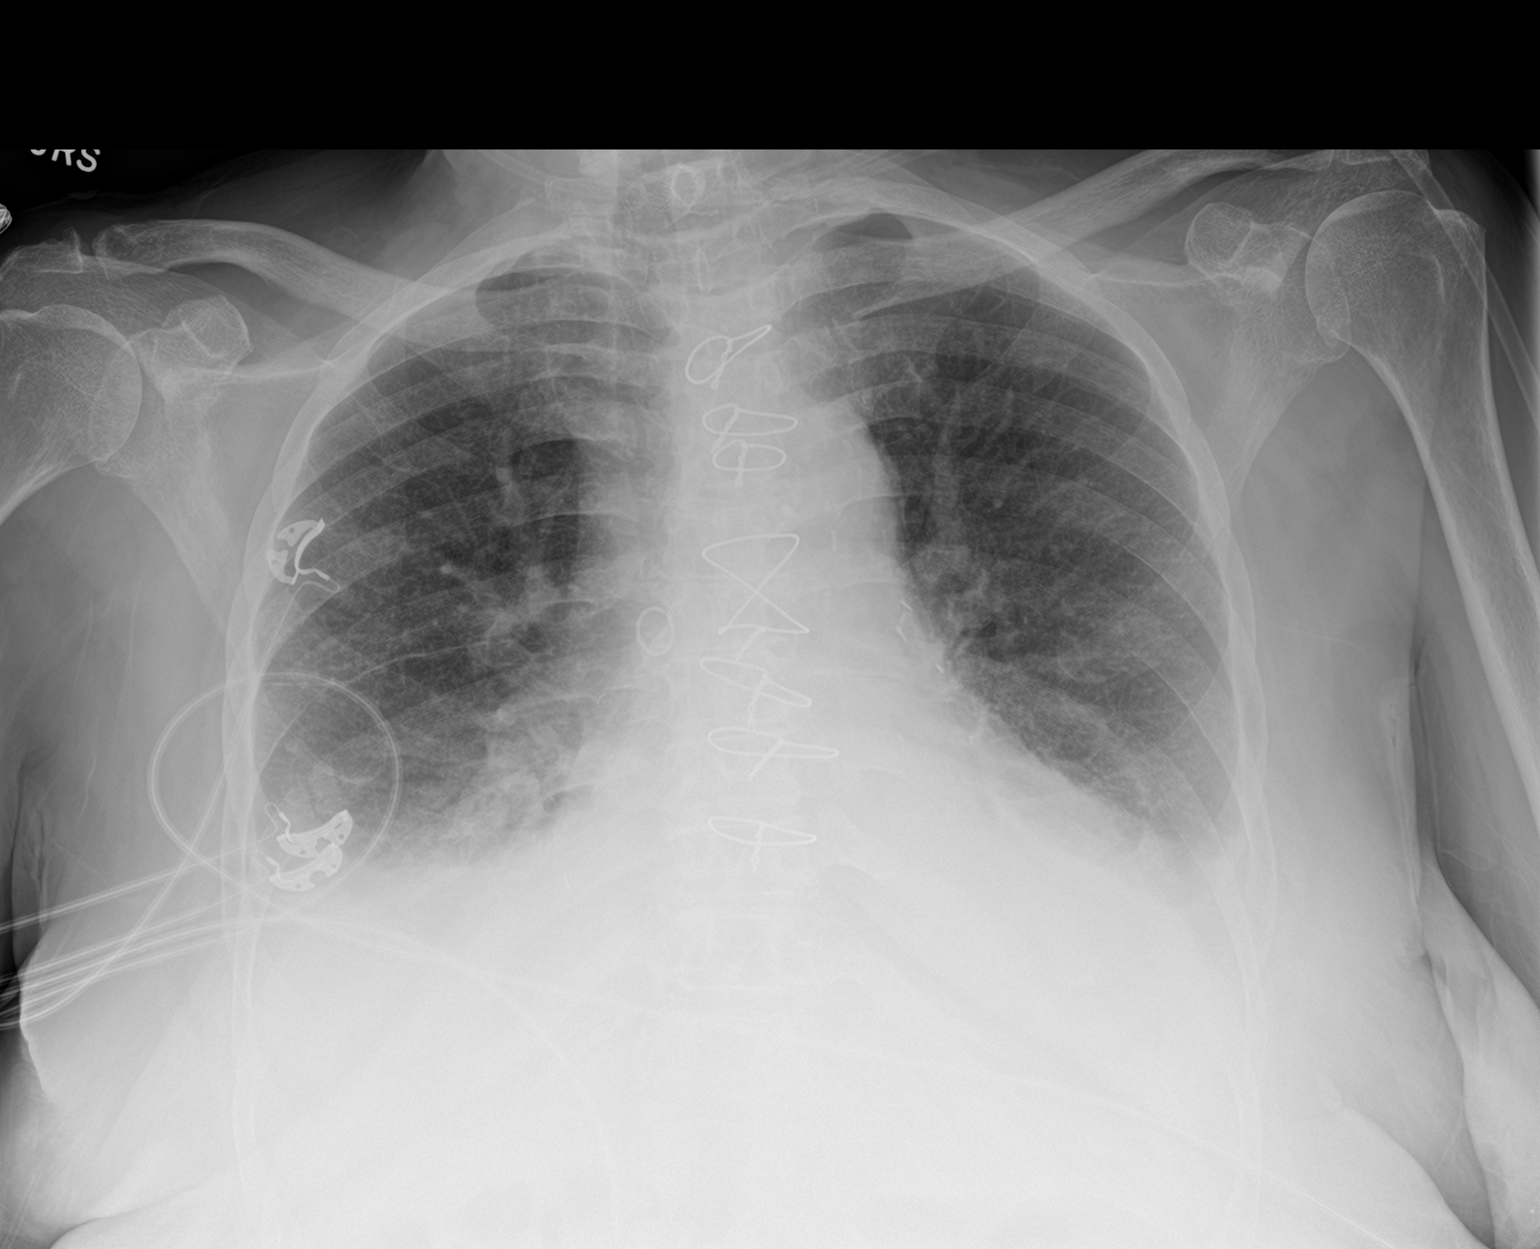

[2 of 2 positions shown; findings below may reference images not displayed]

FINDINGS: Stable cardiomegaly. Status post coronary bypass graft. No
pneumothorax is noted. Mild bilateral pulmonary edema is noted with
bilateral pleural effusions and associated atelectasis. Bony thorax
is unremarkable.
IMPRESSION: Stable cardiomegaly with bilateral pulmonary edema and pleural
effusions and associated atelectasis.

## 2023-03-17 IMAGING — DX DG CHEST 1V PORT
1 series · 1 of 1 positions shown · non-contrast
Comparison: Portable exam 7441 hours compared to 01/27/2021

CLINICAL DATA: Chest pain for 1 week post CABG

EXAM:
PORTABLE CHEST 1 VIEW

[chest]
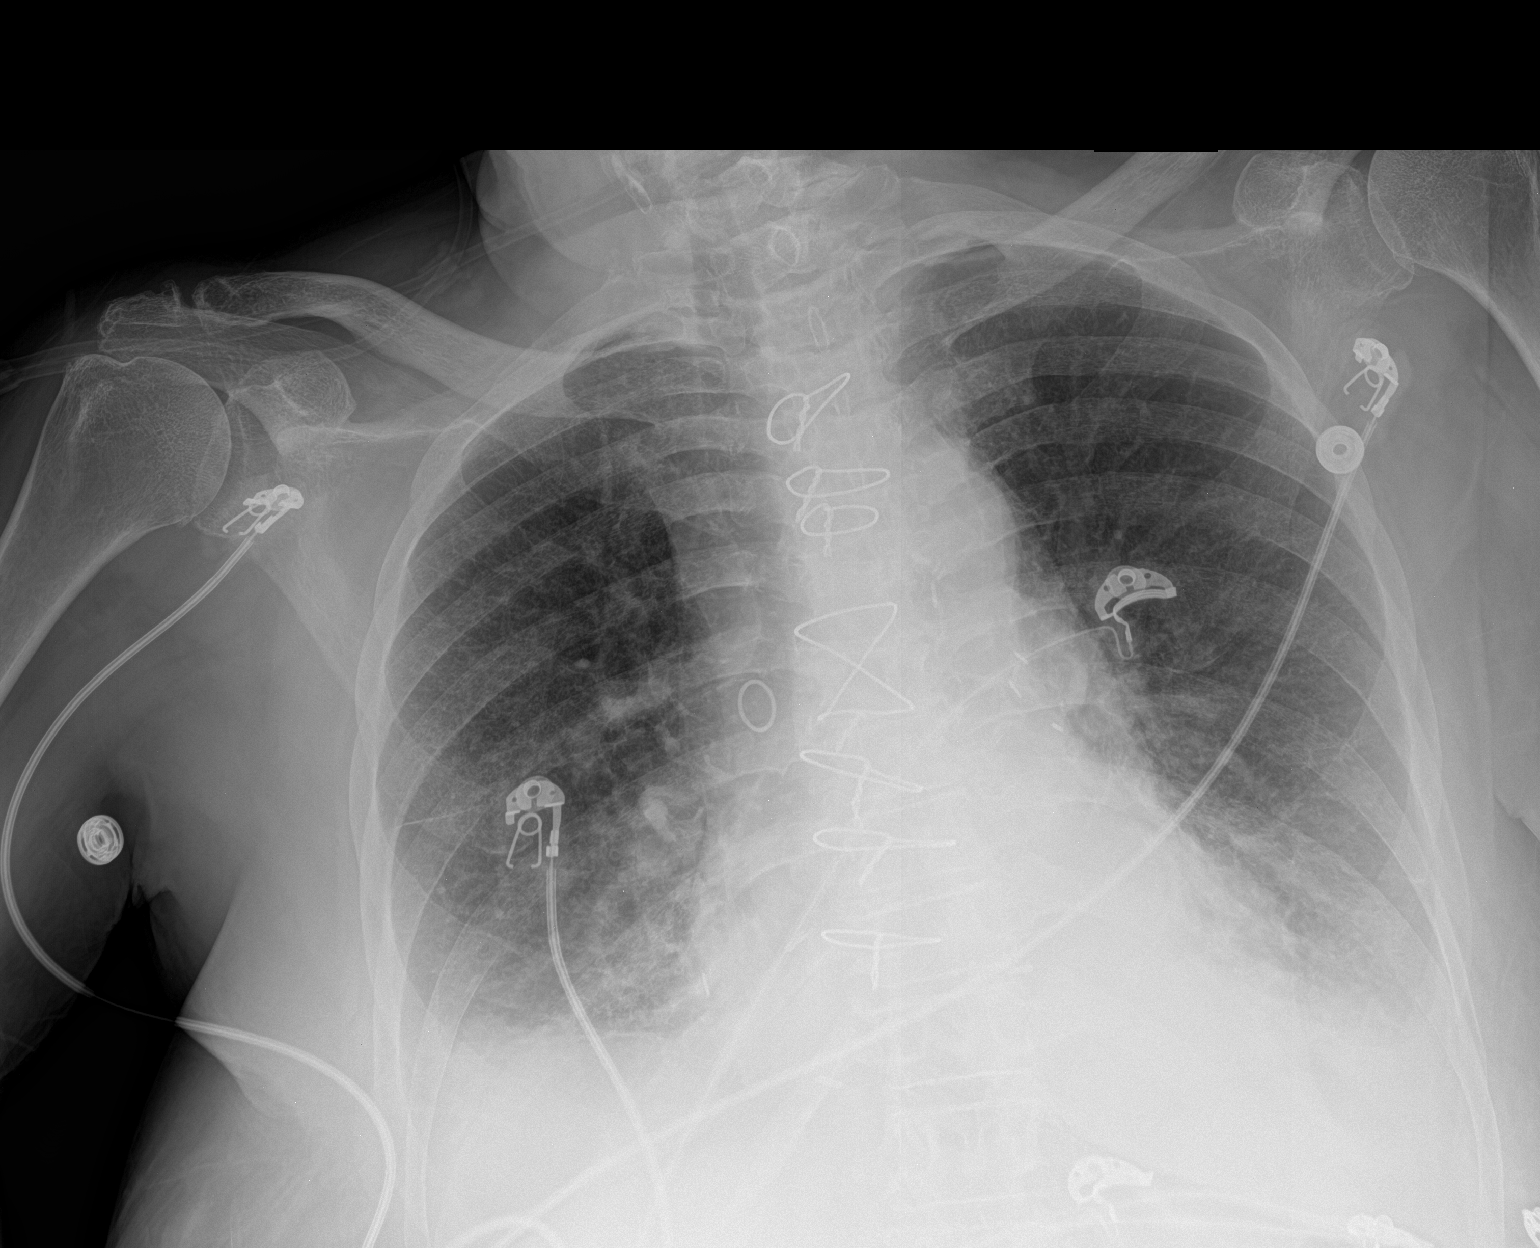

[1 of 1 positions shown; findings below may reference images not displayed]

FINDINGS: Upper normal heart size post CABG.

Mediastinal contours and pulmonary vascularity normal.

Bibasilar atelectasis and small pleural effusions, slightly
decreased from previous exam.

No focal consolidation or pneumothorax.

Bones demineralized.
IMPRESSION: Bibasilar pleural effusions and atelectasis, slightly improved from
01/27/2021
